# Patient Record
Sex: Female | Born: 1975
Health system: Southern US, Community
[De-identification: ages and names within clinical notes are randomized; demographics above are authoritative.]

## PROBLEM LIST (undated history)

## (undated) DIAGNOSIS — C50912 Malignant neoplasm of unspecified site of left female breast: Secondary | ICD-10-CM

## (undated) DIAGNOSIS — Z803 Family history of malignant neoplasm of breast: Secondary | ICD-10-CM

## (undated) DIAGNOSIS — M24119 Other articular cartilage disorders, unspecified shoulder: Secondary | ICD-10-CM

## (undated) DIAGNOSIS — M7541 Impingement syndrome of right shoulder: Secondary | ICD-10-CM

## (undated) DIAGNOSIS — E7800 Pure hypercholesterolemia, unspecified: Secondary | ICD-10-CM

## (undated) DIAGNOSIS — I82409 Acute embolism and thrombosis of unspecified deep veins of unspecified lower extremity: Secondary | ICD-10-CM

## (undated) DIAGNOSIS — M7521 Bicipital tendinitis, right shoulder: Secondary | ICD-10-CM

## (undated) DIAGNOSIS — Z8042 Family history of malignant neoplasm of prostate: Secondary | ICD-10-CM

## (undated) DIAGNOSIS — F988 Other specified behavioral and emotional disorders with onset usually occurring in childhood and adolescence: Secondary | ICD-10-CM

## (undated) DIAGNOSIS — Z9889 Other specified postprocedural states: Secondary | ICD-10-CM

## (undated) DIAGNOSIS — D649 Anemia, unspecified: Secondary | ICD-10-CM

## (undated) DIAGNOSIS — R112 Nausea with vomiting, unspecified: Secondary | ICD-10-CM

## (undated) DIAGNOSIS — R569 Unspecified convulsions: Secondary | ICD-10-CM

## (undated) DIAGNOSIS — G43909 Migraine, unspecified, not intractable, without status migrainosus: Secondary | ICD-10-CM

## (undated) DIAGNOSIS — F419 Anxiety disorder, unspecified: Secondary | ICD-10-CM

## (undated) HISTORY — PX: KNEE ARTHROSCOPY: SUR90

## (undated) HISTORY — PX: ANKLE ARTHROSCOPY: SUR85

## (undated) HISTORY — PX: KNEE ARTHROSCOPY W/ ACL RECONSTRUCTION: SHX1858

## (undated) HISTORY — DX: Family history of malignant neoplasm of prostate: Z80.42

## (undated) HISTORY — DX: Family history of malignant neoplasm of breast: Z80.3

## (undated) NOTE — *Deleted (*Deleted)
Has armband been applied?  {yes no:314532}  Does patient have an allergy to IV contrast dye?: no   Has patient ever received premedication for IV contrast dye?: n/a  Does patient take metformin?: No  If patient does take metformin when was the last dose: n/a  Date of lab work:  10/24/2019 BUN: 10 CR: 0.82 eGfr: >60  IV site: Chest port  Has IV site been added to flowsheet?  Yes

---
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
## 2010-10-19 ENCOUNTER — Emergency Department (HOSPITAL_COMMUNITY)
Admission: EM | Admit: 2010-10-19 | Discharge: 2010-10-19 | Disposition: A | Discharge status: Home / Self Care | Payer: BC Managed Care – PPO | Attending: Emergency Medicine | Admitting: Emergency Medicine

## 2010-10-19 ENCOUNTER — Emergency Department (HOSPITAL_COMMUNITY): Payer: BC Managed Care – PPO

## 2010-10-19 DIAGNOSIS — R5383 Other fatigue: Secondary | ICD-10-CM | POA: Insufficient documentation

## 2010-10-19 DIAGNOSIS — R55 Syncope and collapse: Secondary | ICD-10-CM | POA: Insufficient documentation

## 2010-10-19 DIAGNOSIS — R5381 Other malaise: Secondary | ICD-10-CM | POA: Insufficient documentation

## 2010-10-19 DIAGNOSIS — R51 Headache: Secondary | ICD-10-CM | POA: Insufficient documentation

## 2010-10-19 DIAGNOSIS — H539 Unspecified visual disturbance: Secondary | ICD-10-CM | POA: Insufficient documentation

## 2010-10-19 DIAGNOSIS — H538 Other visual disturbances: Secondary | ICD-10-CM | POA: Insufficient documentation

## 2010-10-19 LAB — POCT I-STAT, CHEM 8
BUN: 14 mg/dL (ref 6–23)
Calcium, Ion: 1.15 mmol/L (ref 1.12–1.32)
Chloride: 103 mEq/L (ref 96–112)
Creatinine, Ser: 0.9 mg/dL (ref 0.50–1.10)
Glucose, Bld: 95 mg/dL (ref 70–99)
HCT: 42 % (ref 36.0–46.0)
Hemoglobin: 14.3 g/dL (ref 12.0–15.0)
Potassium: 3.5 mEq/L (ref 3.5–5.1)
Sodium: 139 mEq/L (ref 135–145)
TCO2: 25 mmol/L (ref 0–100)

## 2010-10-26 ENCOUNTER — Inpatient Hospital Stay (HOSPITAL_COMMUNITY)
Admission: AD | Admit: 2010-10-26 | Discharge: 2010-10-30 | DRG: 053 | Disposition: A | Discharge status: Home / Self Care | Payer: BC Managed Care – PPO | Source: Ambulatory Visit | Attending: Otolaryngology | Admitting: Otolaryngology

## 2010-10-26 ENCOUNTER — Inpatient Hospital Stay (HOSPITAL_COMMUNITY): Payer: BC Managed Care – PPO

## 2010-10-26 DIAGNOSIS — J0130 Acute sphenoidal sinusitis, unspecified: Principal | ICD-10-CM | POA: Diagnosis present

## 2010-10-26 DIAGNOSIS — J323 Chronic sphenoidal sinusitis: Secondary | ICD-10-CM | POA: Diagnosis present

## 2010-10-26 DIAGNOSIS — J3489 Other specified disorders of nose and nasal sinuses: Secondary | ICD-10-CM | POA: Diagnosis present

## 2010-10-29 ENCOUNTER — Other Ambulatory Visit: Payer: Self-pay | Admitting: Otolaryngology

## 2010-10-29 HISTORY — PX: SEPTOPLASTY WITH ETHMOIDECTOMY, AND MAXILLARY ANTROSTOMY: SHX6090

## 2010-10-29 LAB — SURGICAL PCR SCREEN
MRSA, PCR: NEGATIVE
Staphylococcus aureus: NEGATIVE

## 2010-11-04 NOTE — Op Note (Signed)
Laura Anthony, LAVEY NO.:  [id number]  MEDICAL RECORD NO.:  [id number]  LOCATION:  5125                         FACILITY:  [facility]  PHYSICIAN:  Suzanna Obey, M.D.       DATE OF BIRTH:  [birth year]  DATE OF PROCEDURE:  10/29/2010 DATE OF DISCHARGE:                              OPERATIVE REPORT   PREOPERATIVE DIAGNOSIS:  Acute sinusitis and chronic sinusitis.  POSTOPERATIVE DIAGNOSIS:  Acute sinusitis and chronic sinusitis.  SURGICAL PROCEDURES:  Bilateral maxillary antrostomy with left maxillary stripping, left anterior ethmoidectomy, right total ethmoidectomy, and sphenoidotomy, and Stealth computer guidance.  SURGEON:  Suzanna Obey, MD  ANESTHESIA:  General.  ESTIMATED BLOOD LOSS:  Approximately 10 mL.  INDICATIONS:  This is a 35 year old who has had a severe headache and sinusitis for 2 weeks with a history of chronic sinusitis episodes.  She has had MRI and CT scan both of which showed a large cyst in the left maxillary and a significant right sphenoid sinusitis.  Initially, there was some question as to whether this was eroded on the MRI but a CT scan confirmed no erosion, but just large sphenoid sinus and sinusitis.  She has continued to do poorly regarding symptoms and headache in the hospital on intravenous antibiotics and now she is ready to proceed with surgery.  She was discussed of the risks, benefits, and options and all questions were answered and consent was obtained.  OPERATION IN DETAIL:  The patient was taken to the operating room and placed in supine position.  After general endotracheal tube anesthesia, she was prepped and draped in the usual sterile manner.  The Stealth computer system was positioned and calibrated with excellent accuracy. The oxymetazoline pledgets had been placed and the inferior turbinate and middle turbinate were injected with 1% lidocaine with 1:100,000 epinephrine.  The left side was begun opening up the uncinate  process after outfracturing the inferior turbinates.  This was removed with the microdebrider.  Under Stealth guidance, the large cyst once the antrostomy was opened widely could be seen.  The upbiting 40-degree microdebrider was used to strip the lining and cyst out from the inferior aspect of its origination.  The anterior ethmoid was opened, debrided of the small amount of bone and there was good hemostasis.  The operation was then directed to the right side where again the uncinate was removed.  There was a lot of thickened tissue in the uncinate area compared to the left.  This was opened up.  The maxillary antrostomy was opened widely.  The ethmoid was then dissected from anterior bringing it back to posterior to the face of the sphenoid sinus.  There was some thickened mucosa within the ethmoid and then right at the face of the sphenoid there was some polypoid material.  The sphenoid was opened using Stealth guidance and some mucus was suctioned out.  The sphenoid was faced and the antrostomy was opened widely.  There was no evidence of any mass or lesions within the large sphenoid that extended both lateral and medial beyond the midline.  There did not appear to be any fungal debris and there was good hemostasis.  Pledgets were then placed into  the ethmoid and then around the face of the sphenoid with oxymetazoline.  The nasopharynx was suctioned out of all blood and debris.  The patient was then awakened, brought to recovery in stable condition.  Counts were correct.          ______________________________ Suzanna Obey, M.D.     JB/MEDQ  D:  10/29/2010  T:  10/29/2010  Job:  [id number]  cc:   Tammy R. Collins [place], M.D.  Electronically Signed by Suzanna Obey M.D. on 11/04/2010 10:26:50 AM

## 2010-12-02 NOTE — Discharge Summary (Signed)
  Laura Anthony, Laura Anthony NO.:  [id number]  MEDICAL RECORD NO.:  [id number]  LOCATION:  5125                         FACILITY:  [facility]  PHYSICIAN:  Suzanna Obey, M.D.       DATE OF BIRTH:  02-25-1975  DATE OF ADMISSION:  10/26/2010 DATE OF DISCHARGE:  10/30/2010                              DISCHARGE SUMMARY   ADMISSION DIAGNOSES:  Sphenoid sinusitis and headache.  POSTOPERATIVE DIAGNOSES:  Sphenoid sinusitis and headache.  DISCHARGE DIAGNOSES:  Sphenoid sinusitis and headache.  SURGICAL PROCEDURE:  Endoscopic sinus surgery.  HOSPITAL COURSE:  This is a 35 year old who presented with a severe headache that was incapacitating and ongoing which was refractory to medical therapy.  She was admitted for intravenous antibiotics that were delivered but the patient was not improving and wanted to proceed with surgery after contemplating home intravenous antibiotics.  Initially, she got a little better with the headache but then it immediately recurred, so surgery was contemplated, discussed, and she underwent surgery on October 29, 2010.  She did well and the sinuses were opened up.  On October 30, 2010, she felt excellent, headache was substantially better, and was discharged to home with saline irrigation and continue her home antibiotics and to follow up in 1 week.          ______________________________ Suzanna Obey, M.D.     JB/MEDQ  D:  11/25/2010  T:  11/25/2010  Job:  [id number]  Electronically Signed by Suzanna Obey M.D. on 12/02/2010 11:05:34 AM

## 2012-01-12 ENCOUNTER — Other Ambulatory Visit: Payer: Self-pay | Admitting: Orthopedic Surgery

## 2012-01-16 ENCOUNTER — Encounter (HOSPITAL_BASED_OUTPATIENT_CLINIC_OR_DEPARTMENT_OTHER): Payer: Self-pay | Admitting: *Deleted

## 2012-01-17 ENCOUNTER — Encounter (HOSPITAL_BASED_OUTPATIENT_CLINIC_OR_DEPARTMENT_OTHER): Payer: Self-pay | Admitting: *Deleted

## 2012-01-17 ENCOUNTER — Ambulatory Visit (HOSPITAL_BASED_OUTPATIENT_CLINIC_OR_DEPARTMENT_OTHER): Payer: BC Managed Care – PPO | Admitting: Certified Registered Nurse Anesthetist

## 2012-01-17 ENCOUNTER — Encounter (HOSPITAL_BASED_OUTPATIENT_CLINIC_OR_DEPARTMENT_OTHER): Payer: Self-pay | Admitting: Certified Registered Nurse Anesthetist

## 2012-01-17 ENCOUNTER — Encounter (HOSPITAL_BASED_OUTPATIENT_CLINIC_OR_DEPARTMENT_OTHER): Payer: Self-pay | Admitting: Orthopedic Surgery

## 2012-01-17 ENCOUNTER — Ambulatory Visit (HOSPITAL_BASED_OUTPATIENT_CLINIC_OR_DEPARTMENT_OTHER)
Admission: RE | Admit: 2012-01-17 | Discharge: 2012-01-17 | Disposition: A | Discharge status: Home / Self Care | Payer: BC Managed Care – PPO | Source: Ambulatory Visit | Attending: Orthopedic Surgery | Admitting: Orthopedic Surgery

## 2012-01-17 ENCOUNTER — Encounter (HOSPITAL_BASED_OUTPATIENT_CLINIC_OR_DEPARTMENT_OTHER)
Admission: RE | Disposition: A | Discharge status: Home / Self Care | Payer: Self-pay | Source: Ambulatory Visit | Attending: Orthopedic Surgery

## 2012-01-17 DIAGNOSIS — F329 Major depressive disorder, single episode, unspecified: Secondary | ICD-10-CM | POA: Insufficient documentation

## 2012-01-17 DIAGNOSIS — M24139 Other articular cartilage disorders, unspecified wrist: Secondary | ICD-10-CM | POA: Insufficient documentation

## 2012-01-17 DIAGNOSIS — F3289 Other specified depressive episodes: Secondary | ICD-10-CM | POA: Insufficient documentation

## 2012-01-17 DIAGNOSIS — F411 Generalized anxiety disorder: Secondary | ICD-10-CM | POA: Insufficient documentation

## 2012-01-17 DIAGNOSIS — Z885 Allergy status to narcotic agent status: Secondary | ICD-10-CM | POA: Insufficient documentation

## 2012-01-17 HISTORY — DX: Unspecified convulsions: R56.9

## 2012-01-17 HISTORY — PX: WRIST ARTHROSCOPY: SHX838

## 2012-01-17 HISTORY — DX: Anxiety disorder, unspecified: F41.9

## 2012-01-17 HISTORY — DX: Other specified postprocedural states: Z98.890

## 2012-01-17 HISTORY — DX: Other specified postprocedural states: R11.2

## 2012-01-17 SURGERY — ARTHROSCOPY, WRIST
Anesthesia: General | Site: Wrist | Laterality: Right | Wound class: Clean

## 2012-01-17 MED ORDER — ONDANSETRON HCL 4 MG/2ML IJ SOLN
INTRAMUSCULAR | Status: DC | PRN
  Administered 2012-01-17: 4 mg via INTRAVENOUS

## 2012-01-17 MED ORDER — SODIUM CHLORIDE 0.9 % IR SOLN
Status: DC | PRN
  Administered 2012-01-17: 3000 mL

## 2012-01-17 MED ORDER — FENTANYL CITRATE 0.05 MG/ML IJ SOLN
INTRAMUSCULAR | Status: DC | PRN
  Administered 2012-01-17: 50 ug via INTRAVENOUS

## 2012-01-17 MED ORDER — LACTATED RINGERS IV SOLN
INTRAVENOUS | Status: DC
  Administered 2012-01-17 (×2): via INTRAVENOUS

## 2012-01-17 MED ORDER — CEFAZOLIN SODIUM-DEXTROSE 2-3 GM-% IV SOLR
2.0000 g | Freq: Once | INTRAVENOUS | Status: AC
  Administered 2012-01-17: 2 g via INTRAVENOUS

## 2012-01-17 MED ORDER — CHLORHEXIDINE GLUCONATE 4 % EX LIQD: 60.0000 mL | Freq: Once | CUTANEOUS | Status: DC

## 2012-01-17 MED ORDER — BUPIVACAINE-EPINEPHRINE PF 0.5-1:200000 % IJ SOLN
INTRAMUSCULAR | Status: DC | PRN
  Administered 2012-01-17: 25 mL

## 2012-01-17 MED ORDER — DEXAMETHASONE SODIUM PHOSPHATE 10 MG/ML IJ SOLN
INTRAMUSCULAR | Status: DC | PRN
  Administered 2012-01-17: 10 mg via INTRAVENOUS
  Administered 2012-01-17: 5 mg

## 2012-01-17 MED ORDER — MIDAZOLAM HCL 5 MG/5ML IJ SOLN
INTRAMUSCULAR | Status: DC | PRN
  Administered 2012-01-17: 1 mg via INTRAVENOUS

## 2012-01-17 MED ORDER — MIDAZOLAM HCL 2 MG/2ML IJ SOLN
1.0000 mg | INTRAMUSCULAR | Status: DC | PRN
  Administered 2012-01-17: 2 mg via INTRAVENOUS

## 2012-01-17 MED ORDER — HYDROCODONE-ACETAMINOPHEN 5-325 MG PO TABS: ORAL_TABLET | ORAL | Status: DC

## 2012-01-17 MED ORDER — SCOPOLAMINE 1 MG/3DAYS TD PT72
MEDICATED_PATCH | TRANSDERMAL | Status: DC | PRN
  Administered 2012-01-17: 1.5 mg via TRANSDERMAL

## 2012-01-17 MED ORDER — LIDOCAINE HCL (CARDIAC) 20 MG/ML IV SOLN
INTRAVENOUS | Status: DC | PRN
  Administered 2012-01-17: 50 mg via INTRAVENOUS

## 2012-01-17 MED ORDER — PROPOFOL 10 MG/ML IV BOLUS
INTRAVENOUS | Status: DC | PRN
  Administered 2012-01-17: 150 mg via INTRAVENOUS
  Administered 2012-01-17: 50 mg via INTRAVENOUS

## 2012-01-17 MED ORDER — FENTANYL CITRATE 0.05 MG/ML IJ SOLN
50.0000 ug | INTRAMUSCULAR | Status: DC | PRN
  Administered 2012-01-17: 100 ug via INTRAVENOUS

## 2012-01-17 SURGICAL SUPPLY — 92 items
BANDAGE ELASTIC 3 VELCRO ST LF (GAUZE/BANDAGES/DRESSINGS) ×2 IMPLANT
BANDAGE ELASTIC 4 VELCRO ST LF (GAUZE/BANDAGES/DRESSINGS) ×2 IMPLANT
BANDAGE GAUZE ELAST BULKY 4 IN (GAUZE/BANDAGES/DRESSINGS) ×2 IMPLANT
BLADE CUDA 2.0 (BLADE) IMPLANT
BLADE EAR TYMPAN 2.5 60D BEAV (BLADE) IMPLANT
BLADE MINI RND TIP GREEN BEAV (BLADE) IMPLANT
BLADE SURG 15 STRL LF DISP TIS (BLADE) ×3 IMPLANT
BLADE SURG 15 STRL SS (BLADE) ×3
BNDG ELASTIC 2 VLCR STRL LF (GAUZE/BANDAGES/DRESSINGS) IMPLANT
BNDG ESMARK 4X9 LF (GAUZE/BANDAGES/DRESSINGS) IMPLANT
BUR CUDA 2.9 (BURR) IMPLANT
BUR FULL RADIUS 2.0 (BURR) IMPLANT
BUR FULL RADIUS 2.9 (BURR) ×2 IMPLANT
BUR GATOR 2.9 (BURR) IMPLANT
BUR SPHERICAL 2.9 (BURR) IMPLANT
CANISTER OMNI JUG 16 LITER (MISCELLANEOUS) IMPLANT
CANISTER SUCTION 1200CC (MISCELLANEOUS) IMPLANT
CANISTER SUCTION 2500CC (MISCELLANEOUS) ×2 IMPLANT
CHLORAPREP W/TINT 26ML (MISCELLANEOUS) ×2 IMPLANT
CLOTH BEACON ORANGE TIMEOUT ST (SAFETY) ×2 IMPLANT
CORDS BIPOLAR (ELECTRODE) IMPLANT
COVER MAYO STAND STRL (DRAPES) ×4 IMPLANT
COVER TABLE BACK 60X90 (DRAPES) ×2 IMPLANT
CUFF TOURNIQUET SINGLE 18IN (TOURNIQUET CUFF) ×2 IMPLANT
DRAPE EXTREMITY T 121X128X90 (DRAPE) ×2 IMPLANT
DRAPE OEC MINIVIEW 54X84 (DRAPES) IMPLANT
DRAPE SURG 17X23 STRL (DRAPES) ×2 IMPLANT
DRSG KUZMA FLUFF (GAUZE/BANDAGES/DRESSINGS) IMPLANT
ELECT SMALL JOINT 90D BASC (ELECTRODE) IMPLANT
GAUZE XEROFORM 1X8 LF (GAUZE/BANDAGES/DRESSINGS) ×2 IMPLANT
GLOVE BIO SURGEON STRL SZ 6.5 (GLOVE) ×2 IMPLANT
GLOVE BIO SURGEON STRL SZ7.5 (GLOVE) ×2 IMPLANT
GLOVE BIOGEL PI IND STRL 7.0 (GLOVE) ×1 IMPLANT
GLOVE BIOGEL PI IND STRL 8 (GLOVE) ×1 IMPLANT
GLOVE BIOGEL PI IND STRL 8.5 (GLOVE) ×1 IMPLANT
GLOVE BIOGEL PI INDICATOR 7.0 (GLOVE) ×1
GLOVE BIOGEL PI INDICATOR 8 (GLOVE) ×1
GLOVE BIOGEL PI INDICATOR 8.5 (GLOVE) ×1
GLOVE EXAM NITRILE EXT CUFF MD (GLOVE) ×2 IMPLANT
GLOVE SURG ORTHO 8.0 STRL STRW (GLOVE) ×2 IMPLANT
GOWN PREVENTION PLUS XLARGE (GOWN DISPOSABLE) ×2 IMPLANT
GOWN STRL REIN XL XLG (GOWN DISPOSABLE) ×4 IMPLANT
IV SET EXTENSION GRAVITY 40 LF (IV SETS) ×2 IMPLANT
NDL SAFETY ECLIPSE 18X1.5 (NEEDLE) ×3 IMPLANT
NEEDLE FILTER BLUNT 18X 1/2SAF (NEEDLE)
NEEDLE FILTER BLUNT 18X1 1/2 (NEEDLE) IMPLANT
NEEDLE HYPO 18GX1.5 SHARP (NEEDLE) ×3
NEEDLE HYPO 22GX1.5 SAFETY (NEEDLE) ×2 IMPLANT
NEEDLE SPNL 18GX3.5 QUINCKE PK (NEEDLE) IMPLANT
NEEDLE TUOHY 20GX3.5 (NEEDLE) IMPLANT
NS IRRIG 1000ML POUR BTL (IV SOLUTION) IMPLANT
PACK BASIN DAY SURGERY FS (CUSTOM PROCEDURE TRAY) ×2 IMPLANT
PAD CAST 3X4 CTTN HI CHSV (CAST SUPPLIES) ×1 IMPLANT
PAD CAST 4YDX4 CTTN HI CHSV (CAST SUPPLIES) ×1 IMPLANT
PADDING CAST ABS 3INX4YD NS (CAST SUPPLIES) ×1
PADDING CAST ABS 4INX4YD NS (CAST SUPPLIES)
PADDING CAST ABS COTTON 3X4 (CAST SUPPLIES) ×1 IMPLANT
PADDING CAST ABS COTTON 4X4 ST (CAST SUPPLIES) IMPLANT
PADDING CAST COTTON 3X4 STRL (CAST SUPPLIES) ×1
PADDING CAST COTTON 4X4 STRL (CAST SUPPLIES) ×1
ROUTER HOODED VORTEX 2.9MM (BLADE) IMPLANT
SET ARTHROSCOPY TUBING (MISCELLANEOUS) ×1
SET ARTHROSCOPY TUBING LN (MISCELLANEOUS) ×1 IMPLANT
SET SM JOINT TUBING/CANN (CANNULA) IMPLANT
SLEEVE SCD COMPRESS KNEE MED (MISCELLANEOUS) ×2 IMPLANT
SLING ARM FOAM STRAP MED (SOFTGOODS) ×2 IMPLANT
SPLINT PLASTER CAST XFAST 3X15 (CAST SUPPLIES) ×30 IMPLANT
SPLINT PLASTER CAST XFAST 4X15 (CAST SUPPLIES) IMPLANT
SPLINT PLASTER XTRA FAST SET 4 (CAST SUPPLIES)
SPLINT PLASTER XTRA FASTSET 3X (CAST SUPPLIES) ×30
SPONGE GAUZE 4X4 12PLY (GAUZE/BANDAGES/DRESSINGS) ×2 IMPLANT
STOCKINETTE 4X48 STRL (DRAPES) ×2 IMPLANT
STRIP CLOSURE SKIN 1/2X4 (GAUZE/BANDAGES/DRESSINGS) IMPLANT
STRIP CLOSURE SKIN 1/4X4 (GAUZE/BANDAGES/DRESSINGS) IMPLANT
SUCTION FRAZIER TIP 10 FR DISP (SUCTIONS) IMPLANT
SUT ETHILON 4 0 PS 2 18 (SUTURE) ×2 IMPLANT
SUT ETHILON 5 0 P 3 18 (SUTURE)
SUT MERSILENE 4 0 P 3 (SUTURE) IMPLANT
SUT NYLON ETHILON 5-0 P-3 1X18 (SUTURE) IMPLANT
SUT PDS AB 2-0 CT2 27 (SUTURE) IMPLANT
SUT STEEL 3 0 (SUTURE) ×2 IMPLANT
SUT STEEL 4 0 (SUTURE) IMPLANT
SUT VIC AB 2-0 PS2 27 (SUTURE) ×2 IMPLANT
SUT VICRYL 4-0 PS2 18IN ABS (SUTURE) ×2 IMPLANT
SUT VICRYL RAPID 5 0 P 3 (SUTURE) IMPLANT
SUT VICRYL RAPIDE 4/0 PS 2 (SUTURE) IMPLANT
SYR BULB 3OZ (MISCELLANEOUS) IMPLANT
SYR CONTROL 10ML LL (SYRINGE) ×2 IMPLANT
TUBE CONNECTING 20X1/4 (TUBING) IMPLANT
UNDERPAD 30X30 INCONTINENT (UNDERPADS AND DIAPERS) ×2 IMPLANT
WAND 1.5 MICROBLATOR (SURGICAL WAND) ×2 IMPLANT
WATER STERILE IRR 1000ML POUR (IV SOLUTION) ×2 IMPLANT

## 2012-01-17 NOTE — Op Note (Signed)
Dictation [phone]

## 2012-01-17 NOTE — Anesthesia Procedure Notes (Addendum)
Anesthesia Regional Block:  Supraclavicular block  Pre-Anesthetic Checklist: ,, timeout performed, Correct Patient, Correct Site, Correct Laterality, Correct Procedure, Correct Position, site marked, Risks and benefits discussed,  Surgical consent,  Pre-op evaluation,  At surgeon's request and post-op pain management  Laterality: Left and Upper  Prep: chloraprep       Needles:  Injection technique: Single-shot  Needle Type: Echogenic Needle     Needle Length: 5cm 5 cm Needle Gauge: 21    Additional Needles:  Procedures: ultrasound guided (picture in chart) Supraclavicular block Narrative:  Start time: 01/17/2012 11:35 AM End time: 01/17/2012 11:42 AM Injection made incrementally with aspirations every 5 mL.  Performed by: Personally  Anesthesiologist: [name] Silvan  Supraclavicular block Procedure Name: LMA Insertion Date/Time: 01/17/2012 12:50 PM Performed by: Verlan Friends Pre-anesthesia Checklist: Patient identified, Emergency Drugs available, Suction available, Patient being monitored and Timeout performed Patient Re-evaluated:Patient Re-evaluated prior to inductionOxygen Delivery Method: Circle System Utilized Preoxygenation: Pre-oxygenation with 100% oxygen Intubation Type: IV induction Ventilation: Mask ventilation without difficulty LMA: LMA inserted LMA Size: 4.0 Number of attempts: 1 Airway Equipment and Method: bite block Placement Confirmation: positive ETCO2 Tube secured with: Tape Dental Injury: Teeth and Oropharynx as per pre-operative assessment

## 2012-01-17 NOTE — Transfer of Care (Signed)
Immediate Anesthesia Transfer of Care Note  Patient: Laura Anthony  Procedure(s) Performed: Procedure(s) (LRB) with comments: ARTHROSCOPY WRIST (Right) - RIGHT WRIST ARTHROSCOPY WITH TRIANGULAR FIBROCARTILAGE COMPLEX REPAIR AND DEBRIDEMENT   Patient Location: PACU  Anesthesia Type:GA combined with regional for post-op pain  Level of Consciousness: awake, alert  and oriented  Airway & Oxygen Therapy: Patient Spontanous Breathing and Patient connected to face mask oxygen  Post-op Assessment: Report given to PACU RN, Post -op Vital signs reviewed and stable and Patient moving all extremities  Post vital signs: Reviewed and stable  Complications: No apparent anesthesia complications

## 2012-01-17 NOTE — Anesthesia Preprocedure Evaluation (Addendum)
Anesthesia Evaluation Anesthesia Physical Anesthesia Plan  ASA: II  Anesthesia Plan:    Post-op Pain Management:    Induction:   Airway Management Planned:   Additional Equipment:   Intra-op Plan:   Post-operative Plan:   Informed Consent:   Plan Discussed with:   Anesthesia Plan Comments:         Anesthesia Quick Evaluation  

## 2012-01-17 NOTE — Progress Notes (Signed)
Assisted Dr. Crews with right, ultrasound guided, interscalene  block. Side rails up, monitors on throughout procedure. See vital signs in flow sheet. Tolerated Procedure well. 

## 2012-01-17 NOTE — H&P (Signed)
  Laura Anthony is an 36 y.o. female.   Chief Complaint: right wrist tfcc injury HPI: 36 yo rhd female with pain in right wrist x 6 months.  No specific injury.  Pain on ulnar side of wrist.  Injection of wrist with no relief.  Therapy without relief.  Past Medical History  Diagnosis Date  . PONV (postoperative nausea and vomiting)   . Seizures     x 1 as a child - was never on medication to prevent seizures  . Anxiety   . Depression   . 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 Knee arthroscopy w/ acl reconstruction     left  . Adenoidectomy   . Knee arthroscopy     right  . Ankle arthroscopy     right  . Septoplasty with ethmoidectomy, and maxillary antrostomy 10/29/2010    bilat. max. antrostomy with left max. stripping; left ant. ethmoidectomy; right total ethmoidectomy; sphenoidotomy    History reviewed. No pertinent family history. Social History:  reports that she has never smoked. She has never used smokeless tobacco. She reports that she drinks alcohol. She reports that she does not use illicit drugs.  Allergies:  Allergies  Allergen Reactions  . Morphine And Related Shortness Of Breath and Itching    No prescriptions prior to admission    No results found for this or any previous visit (from the past 48 hour(s)).  No results found.   A comprehensive review of systems was negative.  Height 5\' 7"  (1.702 m), weight 68.04 kg (150 lb).  General appearance: alert, cooperative and appears stated age Head: Normocephalic, without obvious abnormality, atraumatic Neck: supple, symmetrical, trachea midline Resp: clear to auscultation bilaterally Cardio: regular rate and rhythm GI: non tender Extremities: intact sensation and capillary refill all digits.  shuck testing of drum without instability.  crepitance in wrist with rotational motion. Pulses: 2+ and symmetric Skin: Skin color, texture,  turgor normal. No rashes or lesions Neurologic: Grossly normal Incision/Wound: na  Assessment/Plan Right wrist tfcc injury.  MRI with possible tfcc injury to superficial and possibly deep fibers.  Non operative and operative treatment options were discussed with the patient and patient wishes to proceed with operative treatment. Risks, benefits, and alternatives of surgery were discussed and the patient agrees with the plan of care.   Bee Hammerschmidt R 01/17/2012, 9:28 AM

## 2012-01-17 NOTE — Anesthesia Postprocedure Evaluation (Signed)
  Anesthesia Post-op Note  Patient: Laura Anthony  Procedure(s) Performed: Procedure(s) (LRB) with comments: ARTHROSCOPY WRIST (Right) - RIGHT WRIST ARTHROSCOPY WITH TRIANGULAR FIBROCARTILAGE COMPLEX REPAIR AND DEBRIDEMENT   Patient Location: PACU  Anesthesia Type:GA combined with regional for post-op pain  Level of Consciousness: awake, alert  and oriented  Airway and Oxygen Therapy: Patient Spontanous Breathing  Post-op Pain: none  Post-op Assessment: Post-op Vital signs reviewed  Post-op Vital Signs: Reviewed  Complications: No apparent anesthesia complications

## 2012-01-18 ENCOUNTER — Encounter (HOSPITAL_BASED_OUTPATIENT_CLINIC_OR_DEPARTMENT_OTHER): Payer: Self-pay | Admitting: Orthopedic Surgery

## 2012-01-18 NOTE — Op Note (Signed)
Laura Anthony, Laura Anthony             ACCOUNT NO.:  [id number]  MEDICAL RECORD NO.:  [id number]  LOCATION:                                 FACILITY:  PHYSICIAN:  Betha Loa, MD             DATE OF BIRTH:  DATE OF PROCEDURE:  01/17/2012 DATE OF DISCHARGE:                              OPERATIVE REPORT   PREOPERATIVE DIAGNOSIS:  Right triangular fibrocartilage complex peripheral tear.  POSTOPERATIVE DIAGNOSIS:  Right triangular fibrocartilage complex peripheral tear.  PROCEDURE:  Right wrist arthroscopy with repair of superficial fibers, triangular fibrocartilage complex.  SURGEON:  Betha Loa, MD  ASSISTANT:  Cindee Salt, MD  ANESTHESIA:  General with regional.  IV FLUIDS:  Per anesthesia flow sheet.  ESTIMATED BLOOD LOSS:  Minimal.  COMPLICATIONS:  None.  SPECIMENS:  None.  TOURNIQUET TIME:  None.  DISPOSITION:  Stable to PACU.  INDICATIONS:  Ms. Milroy is a 36 year old female who has had 6 months of ulnar-sided right wrist pain.  She does not remember specific injuries.  She has tried nonoperative treatments.  She had an MRI done, which showed tear of the superficial fibers of the TFCC.  I discussed with Ms. Runkel the nature of the injury.  We discussed nonoperative and operative treatment options.  She wished to proceed with operative treatment with arthroscopy and potential repair.  Risks, benefits, and alternatives of the surgery were discussed including the risk of blood loss; infection; damage to nerves, vessels, tendons, ligaments, bone; failure of surgery; need for additional surgery; complications with wound healing; continued pain.  She voiced understanding of these risks and elected to proceed.  OPERATIVE COURSE:  After being identified preoperatively by myself, the patient and I agreed upon the procedure and site of procedure.  Surgical site was marked.  Risks, benefits, and alternatives of the surgery were reviewed and she wished to proceed.   Surgical consent had been signed. She was given 2 g of IV Ancef as preoperative antibiotic prophylaxis. She was transferred to the operating room and placed on the operating room table in supine position with the right upper extremity on an armboard.  General anesthesia was induced by the anesthesiologist.  The regional block had been performed by anesthesia in preoperative holding. Right upper extremity was prepped and draped in normal sterile orthopedic fashion.  A surgical pause was performed between the surgeons, anesthesia, and operating room staff, and all were in agreement as to patient, procedure, and site of procedure.  The hand had been secured in the arthroscopy tower with the finger traps on the index and long finger, which were removed at the end of the case.  The 3-4 portal was entered using a needle.  The joint was insufflated.  An incision was made through the skin only and spreading technique was used in the deeper tissues.  The joint was entered with the trocar.  The camera Was introduced and the joint was inspected.  The radioscapholunate and long radiolunate ligaments were identified and were intact.  The radioscaphocapitate and ulnocarpal ligaments were identified and appeared intact.  The scapholunate and lunotriquetral ligaments were identified and did not have any evidence of tear.  The TFCC was examined.  The prestyloid recess was identified.  There appeared to be some tearing of the superficial fibers of the TFCC at the ulnar attachment.  The midcarpal joint was entered.  The scapholunate and lunotriquetral intervals were visualized and appeared normal.  The capitohamate joint was visualized and appeared intact.  The shaver was introduced through the 4-5 portal in the radiocarpal joint.  This was used to debride the area around the TFCC where there was some synovitis. The probe was inserted and the peripheral tear identified.  The electrocautery wand was used to  obtain hemostasis.  Repair of the peripheral tear was performed using a technique with Vicryl suture. This was passed in a vertical mattress fashion.  Tension on the sutures reapposed the torn edge of the TFCC to the capsule well.  An incision was made on the dorsum of the wrist.  The sixth dorsal compartment was entered.  One of the suture arms had gone through the ECU tendon and this was freed up.  The remaining suture arm was identified and ensured that there was no neurovascular structure underneath the sutures.  The suture was then tied over the capsule. This apposed to be torn TFCC edge to the edge of the capsule well.  This was performed under direct visualization through the arthroscope.  The arthroscopic equipment was then removed.  A 4-0 Vicryl suture was used to repair the retinacular rent in the dorsum of the wrist with a single figure-of-eight suture.  The skin was repaired with 4-0 nylon in a horizontal mattress fashion.  The wounds were then dressed with sterile Xeroform and 4x4s, and wrapped with a Kerlix bandage.  A splint was placed with the elbow at 90 degrees and the forearm in mid-supination. This was wrapped with Kerlix and Ace bandage.  Tourniquet was never inflated.  The fingertips were all pink with brisk capillary refill after the procedure.  The operative drapes were broken down.  The patient was awakened from anesthesia safely.  She was transferred back to the stretcher and taken to PACU in stable condition.  I will see her back in the office in 1 week for postoperative followup.  I will give her Norco 5/325 1-2 p.o. q.6 hours p.r.n. pain, dispensed #40.  If she is not able to tolerate this, we will use Ultram.     Betha Loa, MD     KK/MEDQ  D:  01/17/2012  T:  01/18/2012  Job:  [id number]

## 2012-12-13 DIAGNOSIS — G4700 Insomnia, unspecified: Secondary | ICD-10-CM | POA: Insufficient documentation

## 2012-12-13 DIAGNOSIS — F419 Anxiety disorder, unspecified: Secondary | ICD-10-CM | POA: Insufficient documentation

## 2013-01-09 DIAGNOSIS — F988 Other specified behavioral and emotional disorders with onset usually occurring in childhood and adolescence: Secondary | ICD-10-CM | POA: Insufficient documentation

## 2013-03-22 ENCOUNTER — Other Ambulatory Visit: Payer: Self-pay | Admitting: Obstetrics and Gynecology

## 2013-03-22 DIAGNOSIS — Z803 Family history of malignant neoplasm of breast: Secondary | ICD-10-CM

## 2013-03-26 ENCOUNTER — Ambulatory Visit
Admission: RE | Admit: 2013-03-26 | Discharge: 2013-03-26 | Disposition: A | Discharge status: Home / Self Care | Payer: BC Managed Care – PPO | Source: Ambulatory Visit | Attending: Obstetrics and Gynecology | Admitting: Obstetrics and Gynecology

## 2013-03-26 DIAGNOSIS — Z803 Family history of malignant neoplasm of breast: Secondary | ICD-10-CM

## 2013-03-26 MED ORDER — GADOBENATE DIMEGLUMINE 529 MG/ML IV SOLN
13.0000 mL | Freq: Once | INTRAVENOUS | Status: AC | PRN
  Administered 2013-03-26: 13 mL via INTRAVENOUS

## 2013-03-28 ENCOUNTER — Other Ambulatory Visit: Payer: BC Managed Care – PPO

## 2013-07-01 ENCOUNTER — Other Ambulatory Visit: Payer: Self-pay | Admitting: Orthopedic Surgery

## 2013-07-01 DIAGNOSIS — M7541 Impingement syndrome of right shoulder: Secondary | ICD-10-CM

## 2013-07-01 DIAGNOSIS — M7521 Bicipital tendinitis, right shoulder: Secondary | ICD-10-CM

## 2013-07-01 HISTORY — DX: Impingement syndrome of right shoulder: M75.41

## 2013-07-01 HISTORY — DX: Bicipital tendinitis, right shoulder: M75.21

## 2013-07-05 ENCOUNTER — Encounter (HOSPITAL_BASED_OUTPATIENT_CLINIC_OR_DEPARTMENT_OTHER): Payer: Self-pay | Admitting: *Deleted

## 2013-07-12 ENCOUNTER — Encounter (HOSPITAL_BASED_OUTPATIENT_CLINIC_OR_DEPARTMENT_OTHER): Payer: BC Managed Care – PPO | Admitting: Anesthesiology

## 2013-07-12 ENCOUNTER — Ambulatory Visit (HOSPITAL_BASED_OUTPATIENT_CLINIC_OR_DEPARTMENT_OTHER)
Admission: RE | Admit: 2013-07-12 | Discharge: 2013-07-12 | Disposition: A | Discharge status: Home / Self Care | Payer: BC Managed Care – PPO | Source: Ambulatory Visit | Attending: Orthopedic Surgery | Admitting: Orthopedic Surgery

## 2013-07-12 ENCOUNTER — Encounter (HOSPITAL_BASED_OUTPATIENT_CLINIC_OR_DEPARTMENT_OTHER)
Admission: RE | Disposition: A | Discharge status: Home / Self Care | Payer: Self-pay | Source: Ambulatory Visit | Attending: Orthopedic Surgery

## 2013-07-12 ENCOUNTER — Encounter (HOSPITAL_BASED_OUTPATIENT_CLINIC_OR_DEPARTMENT_OTHER): Payer: Self-pay

## 2013-07-12 ENCOUNTER — Ambulatory Visit (HOSPITAL_BASED_OUTPATIENT_CLINIC_OR_DEPARTMENT_OTHER): Payer: BC Managed Care – PPO | Admitting: Anesthesiology

## 2013-07-12 DIAGNOSIS — Z79899 Other long term (current) drug therapy: Secondary | ICD-10-CM | POA: Insufficient documentation

## 2013-07-12 DIAGNOSIS — M25819 Other specified joint disorders, unspecified shoulder: Secondary | ICD-10-CM | POA: Insufficient documentation

## 2013-07-12 DIAGNOSIS — M7542 Impingement syndrome of left shoulder: Secondary | ICD-10-CM

## 2013-07-12 DIAGNOSIS — M25519 Pain in unspecified shoulder: Secondary | ICD-10-CM | POA: Insufficient documentation

## 2013-07-12 DIAGNOSIS — M7541 Impingement syndrome of right shoulder: Secondary | ICD-10-CM

## 2013-07-12 DIAGNOSIS — M7580 Other shoulder lesions, unspecified shoulder: Principal | ICD-10-CM

## 2013-07-12 DIAGNOSIS — F988 Other specified behavioral and emotional disorders with onset usually occurring in childhood and adolescence: Secondary | ICD-10-CM | POA: Insufficient documentation

## 2013-07-12 HISTORY — DX: Bicipital tendinitis, right shoulder: M75.21

## 2013-07-12 HISTORY — DX: Other specified behavioral and emotional disorders with onset usually occurring in childhood and adolescence: F98.8

## 2013-07-12 HISTORY — DX: Impingement syndrome of right shoulder: M75.41

## 2013-07-12 HISTORY — DX: Other articular cartilage disorders, unspecified shoulder: M24.119

## 2013-07-12 HISTORY — PX: SHOULDER ARTHROSCOPY WITH SUBACROMIAL DECOMPRESSION AND BICEP TENDON REPAIR: SHX5689

## 2013-07-12 SURGERY — SHOULDER ARTHROSCOPY WITH SUBACROMIAL DECOMPRESSION AND BICEP TENDON REPAIR
Anesthesia: General | Site: Shoulder | Laterality: Right

## 2013-07-12 MED ORDER — PROPOFOL 10 MG/ML IV BOLUS
INTRAVENOUS | Status: DC | PRN
  Administered 2013-07-12: 160 mg via INTRAVENOUS

## 2013-07-12 MED ORDER — OXYCODONE HCL 5 MG PO TABS: 5.0000 mg | ORAL_TABLET | Freq: Once | ORAL | Status: DC | PRN

## 2013-07-12 MED ORDER — HYDROMORPHONE HCL PF 1 MG/ML IJ SOLN
0.2500 mg | INTRAMUSCULAR | Status: DC | PRN
  Administered 2013-07-12: 0.5 mg via INTRAVENOUS

## 2013-07-12 MED ORDER — MIDAZOLAM HCL 2 MG/2ML IJ SOLN
1.0000 mg | INTRAMUSCULAR | Status: DC | PRN
  Administered 2013-07-12: 2 mg via INTRAVENOUS

## 2013-07-12 MED ORDER — SODIUM CHLORIDE 0.9 % IR SOLN
Status: DC | PRN
  Administered 2013-07-12: 6000 mL

## 2013-07-12 MED ORDER — FENTANYL CITRATE 0.05 MG/ML IJ SOLN
50.0000 ug | INTRAMUSCULAR | Status: DC | PRN
  Administered 2013-07-12: 100 ug via INTRAVENOUS

## 2013-07-12 MED ORDER — FENTANYL CITRATE 0.05 MG/ML IJ SOLN
INTRAMUSCULAR | Status: AC
  Filled 2013-07-12: qty 2

## 2013-07-12 MED ORDER — DEXAMETHASONE SODIUM PHOSPHATE 4 MG/ML IJ SOLN
INTRAMUSCULAR | Status: DC | PRN
  Administered 2013-07-12: 10 mg via INTRAVENOUS

## 2013-07-12 MED ORDER — METHOCARBAMOL 500 MG PO TABS: 500.0000 mg | ORAL_TABLET | Freq: Four times a day (QID) | ORAL | Status: DC

## 2013-07-12 MED ORDER — MIDAZOLAM HCL 2 MG/ML PO SYRP: 12.0000 mg | ORAL_SOLUTION | Freq: Once | ORAL | Status: DC | PRN

## 2013-07-12 MED ORDER — SENNA-DOCUSATE SODIUM 8.6-50 MG PO TABS: 2.0000 | ORAL_TABLET | Freq: Every day | ORAL | Status: DC

## 2013-07-12 MED ORDER — PROMETHAZINE HCL 25 MG/ML IJ SOLN: 6.2500 mg | INTRAMUSCULAR | Status: DC | PRN

## 2013-07-12 MED ORDER — BUPIVACAINE HCL (PF) 0.5 % IJ SOLN
INTRAMUSCULAR | Status: DC | PRN
  Administered 2013-07-12: 20 mL via PERINEURAL

## 2013-07-12 MED ORDER — LIDOCAINE HCL (CARDIAC) 20 MG/ML IV SOLN
INTRAVENOUS | Status: DC | PRN
  Administered 2013-07-12: 40 mg via INTRAVENOUS

## 2013-07-12 MED ORDER — ONDANSETRON HCL 4 MG/2ML IJ SOLN
INTRAMUSCULAR | Status: DC | PRN
  Administered 2013-07-12: 4 mg via INTRAVENOUS

## 2013-07-12 MED ORDER — PROMETHAZINE HCL 25 MG PO TABS: 25.0000 mg | ORAL_TABLET | Freq: Four times a day (QID) | ORAL | Status: DC | PRN

## 2013-07-12 MED ORDER — MIDAZOLAM HCL 2 MG/2ML IJ SOLN
INTRAMUSCULAR | Status: AC
  Filled 2013-07-12: qty 2

## 2013-07-12 MED ORDER — HYDROMORPHONE HCL PF 1 MG/ML IJ SOLN
INTRAMUSCULAR | Status: AC
  Filled 2013-07-12: qty 1

## 2013-07-12 MED ORDER — OXYCODONE HCL 5 MG/5ML PO SOLN: 5.0000 mg | Freq: Once | ORAL | Status: DC | PRN

## 2013-07-12 MED ORDER — SUCCINYLCHOLINE CHLORIDE 20 MG/ML IJ SOLN
INTRAMUSCULAR | Status: DC | PRN
  Administered 2013-07-12: 80 mg via INTRAVENOUS

## 2013-07-12 MED ORDER — METOCLOPRAMIDE HCL 5 MG/ML IJ SOLN
INTRAMUSCULAR | Status: DC | PRN
  Administered 2013-07-12: 10 mg via INTRAVENOUS

## 2013-07-12 MED ORDER — CEFAZOLIN SODIUM-DEXTROSE 2-3 GM-% IV SOLR
2.0000 g | INTRAVENOUS | Status: AC
  Administered 2013-07-12: 2 g via INTRAVENOUS

## 2013-07-12 MED ORDER — CEFAZOLIN SODIUM-DEXTROSE 2-3 GM-% IV SOLR
INTRAVENOUS | Status: AC
  Filled 2013-07-12: qty 50

## 2013-07-12 MED ORDER — OXYCODONE-ACETAMINOPHEN 5-325 MG PO TABS: 1.0000 | ORAL_TABLET | Freq: Four times a day (QID) | ORAL | Status: DC | PRN

## 2013-07-12 MED ORDER — LACTATED RINGERS IV SOLN
INTRAVENOUS | Status: DC
  Administered 2013-07-12 (×2): via INTRAVENOUS

## 2013-07-12 SURGICAL SUPPLY — 68 items
BLADE CUTTER GATOR 3.5 (BLADE) ×2 IMPLANT
BLADE GREAT WHITE 4.2 (BLADE) IMPLANT
BLADE SURG 15 STRL LF DISP TIS (BLADE) IMPLANT
BLADE SURG 15 STRL SS (BLADE)
BUR OVAL 4.0 (BURR) IMPLANT
BUR OVAL 6.0 (BURR) ×2 IMPLANT
CANNULA 5.75X71 LONG (CANNULA) ×2 IMPLANT
CANNULA TWIST IN 8.25X7CM (CANNULA) IMPLANT
DECANTER SPIKE VIAL GLASS SM (MISCELLANEOUS) IMPLANT
DRAPE INCISE IOBAN 66X45 STRL (DRAPES) ×2 IMPLANT
DRAPE SHOULDER BEACH CHAIR (DRAPES) ×2 IMPLANT
DRAPE U 20/CS (DRAPES) ×2 IMPLANT
DRAPE U-SHAPE 47X51 STRL (DRAPES) ×2 IMPLANT
DRSG PAD ABDOMINAL 8X10 ST (GAUZE/BANDAGES/DRESSINGS) ×2 IMPLANT
DURAPREP 26ML APPLICATOR (WOUND CARE) ×2 IMPLANT
ELECT REM PT RETURN 9FT ADLT (ELECTROSURGICAL)
ELECTRODE REM PT RTRN 9FT ADLT (ELECTROSURGICAL) IMPLANT
FIBERSTICK 2 (SUTURE) IMPLANT
GAUZE SPONGE 4X4 12PLY STRL (GAUZE/BANDAGES/DRESSINGS) ×2 IMPLANT
GLOVE BIO SURGEON STRL SZ 6.5 (GLOVE) ×2 IMPLANT
GLOVE BIO SURGEON STRL SZ8 (GLOVE) ×2 IMPLANT
GLOVE BIOGEL PI IND STRL 7.0 (GLOVE) ×1 IMPLANT
GLOVE BIOGEL PI IND STRL 8 (GLOVE) ×2 IMPLANT
GLOVE BIOGEL PI INDICATOR 7.0 (GLOVE) ×1
GLOVE BIOGEL PI INDICATOR 8 (GLOVE) ×2
GLOVE ORTHO TXT STRL SZ7.5 (GLOVE) ×2 IMPLANT
GOWN STRL REUS W/ TWL LRG LVL3 (GOWN DISPOSABLE) ×1 IMPLANT
GOWN STRL REUS W/ TWL XL LVL3 (GOWN DISPOSABLE) ×2 IMPLANT
GOWN STRL REUS W/TWL LRG LVL3 (GOWN DISPOSABLE) ×1
GOWN STRL REUS W/TWL XL LVL3 (GOWN DISPOSABLE) ×2
IMMOBILIZER SHOULDER FOAM XLGE (SOFTGOODS) IMPLANT
KIT BIO-TENODESIS 3X8 DISP (MISCELLANEOUS)
KIT INSRT BABSR STRL DISP BTN (MISCELLANEOUS) IMPLANT
KIT SHOULDER TRACTION (DRAPES) ×2 IMPLANT
MANIFOLD NEPTUNE II (INSTRUMENTS) ×2 IMPLANT
NDL SUT 6 .5 CRC .975X.05 MAYO (NEEDLE) IMPLANT
NEEDLE MAYO TAPER (NEEDLE)
NEEDLE SCORPION MULTI FIRE (NEEDLE) IMPLANT
PACK ARTHROSCOPY DSU (CUSTOM PROCEDURE TRAY) ×2 IMPLANT
PACK BASIN DAY SURGERY FS (CUSTOM PROCEDURE TRAY) ×2 IMPLANT
PENCIL BUTTON HOLSTER BLD 10FT (ELECTRODE) IMPLANT
SET ARTHROSCOPY TUBING (MISCELLANEOUS) ×1
SET ARTHROSCOPY TUBING LN (MISCELLANEOUS) ×1 IMPLANT
SHEET MEDIUM DRAPE 40X70 STRL (DRAPES) ×2 IMPLANT
SLEEVE SCD COMPRESS KNEE MED (MISCELLANEOUS) ×2 IMPLANT
SLING ARM IMMOBILIZER LRG (SOFTGOODS) IMPLANT
SLING ARM IMMOBILIZER MED (SOFTGOODS) IMPLANT
SLING ARM LRG ADULT FOAM STRAP (SOFTGOODS) IMPLANT
SLING ARM MED ADULT FOAM STRAP (SOFTGOODS) IMPLANT
SLING ARM XL FOAM STRAP (SOFTGOODS) IMPLANT
SPONGE LAP 4X18 X RAY DECT (DISPOSABLE) ×2 IMPLANT
STRIP CLOSURE SKIN 1/2X4 (GAUZE/BANDAGES/DRESSINGS) ×2 IMPLANT
SUT FIBERWIRE #2 38 T-5 BLUE (SUTURE)
SUT MNCRL AB 4-0 PS2 18 (SUTURE) ×2 IMPLANT
SUT PDS AB 1 CT  36 (SUTURE)
SUT PDS AB 1 CT 36 (SUTURE) IMPLANT
SUT PROLENE 0 CT 1 CR/8 (SUTURE) IMPLANT
SUT TIGER TAPE 7 IN WHITE (SUTURE) IMPLANT
SUT VIC AB 3-0 SH 27 (SUTURE)
SUT VIC AB 3-0 SH 27X BRD (SUTURE) IMPLANT
SUT VICRYL 3-0 CR8 SH (SUTURE) IMPLANT
SUTURE FIBERWR #2 38 T-5 BLUE (SUTURE) IMPLANT
TAPE FIBER 2MM 7IN #2 BLUE (SUTURE) ×2 IMPLANT
TOWEL OR 17X24 6PK STRL BLUE (TOWEL DISPOSABLE) ×2 IMPLANT
TOWEL OR NON WOVEN STRL DISP B (DISPOSABLE) ×2 IMPLANT
TUBE CONNECTING 20X1/4 (TUBING) IMPLANT
WAND STAR VAC 90 (SURGICAL WAND) ×2 IMPLANT
WATER STERILE IRR 1000ML POUR (IV SOLUTION) ×2 IMPLANT

## 2013-07-12 NOTE — Anesthesia Postprocedure Evaluation (Signed)
Anesthesia Post Note  Patient: Laura Anthony  Procedure(s) Performed: Procedure(s) (LRB): RIGHT SHOULDER ARTHROSCOPY DEBRIDEMENT EXTENSIVE DECOMPRESSION SUBACROMIAL PARTIAL ACROMIOPLASTY (Right)  Anesthesia type: General  Patient location: PACU  Post pain: Pain level controlled  Post assessment: Patient's Cardiovascular Status Stable  Last Vitals:  Filed Vitals:   07/12/13 1530  BP: 114/72  Pulse:   Temp: 36.9 C  Resp: 16    Post vital signs: Reviewed and stable  Level of consciousness: alert  Complications: No apparent anesthesia complications

## 2013-07-12 NOTE — Op Note (Signed)
07/12/2013  1:39 PM  PATIENT:  Laura Anthony    PRE-OPERATIVE DIAGNOSIS:  Right shoulder pain  POST-OPERATIVE DIAGNOSIS:  Right shoulder pain with impingement syndrome, CA ligament fraying, 15% undersurface supraspinatus tear.  PROCEDURE:  Right shoulder arthroscopy with debridement of the undersurface of the supraspinatus, with bursectomy and acromioplasty  SURGEON:  Johnny Bridge, MD  PHYSICIAN ASSISTANT: Joya Gaskins, OPA-C, present and scrubbed throughout the case, critical for completion in a timely fashion, and for retraction, instrumentation, and closure.  ANESTHESIA:   General  PREOPERATIVE INDICATIONS:  Laura Anthony is a  38 y.o. female who had persistent right shoulder pain, possibly after a lacrosse injury from years ago, who failed conservative measures and elected for surgical management.    The risks benefits and alternatives were discussed with the patient preoperatively including but not limited to the risks of infection, bleeding, nerve injury, cardiopulmonary complications, the need for revision surgery, among others, and the patient was willing to proceed. We also discussed the risks for incomplete relief of symptoms, stiffness, among others.  OPERATIVE IMPLANTS: None  OPERATIVE FINDINGS: The shoulder had full motion during examination under anesthesia, and did not have any significant glenohumeral instability. There was some slight clicking with overhead motion during exam.  The glenohumeral articular cartilage was all normal. The biceps tendon was completely normal, and the labrum was pristine both anteriorly, superiorly, and posteriorly. The undersurface of the supraspinatus did have fraying, that was about 15% of the width of the tendon. There was no injury to the biceps pulley, and no subscapularis damage, and no evidence for capsular avulsion on either the anterior humeral or posterior humeral side.  There was not a substantial amount of bursitis, however  the CA ligament did have a very distinct amount of fraying. There was moderate subacromial spurring. The a.c. joint however was normal, and I did not see significant spurring inferiorly.  OPERATIVE PROCEDURE: The patient is brought to the operating room and placed in the supine position. General anesthesia was administered. IV antibiotics were given. The right upper extremity was examined, with the above-named findings noted, and then prepped and draped in usual sterile fashion, positioned in the semilateral decubitus position with all bony prominences padded. Time out was performed. Diagnostic arthroscopy was carried out with the above-named findings noted. The arthroscopic shaver was used to debride the undersurface of the supraspinatus, back to a stable configuration. I also switched portals, to evaluate the entirety of the labrum more thoroughly, and viewed from anteriorly. There was no significant labral tearing. There is a little bit of fraying posteriorly which I debrided. The biceps tendon was pulled within the joint from the groove, and found to be intact.  I then went to the subacromial space, and performed a light bursectomy. The CA ligament was frayed, and I released this with the ArthriCare, and then performed a light acromioplasty using a 6-0 bur.  I inspected the a.c. joint, although did not violate the capsule, and did not see significant abnormality, (the MRI also did not demonstrate significant arthrosis, and clinically she does not have pain in that location,) and so then I removed the arthroscopic instruments, closed the portals with Monocryl followed by Steri-Strips and sterile gauze. She was awakened and returned to the PACU in stable and satisfactory condition. There were no complications and she tolerated the procedure well.

## 2013-07-12 NOTE — Transfer of Care (Signed)
Immediate Anesthesia Transfer of Care Note  Patient: Laura Anthony  Procedure(s) Performed: Procedure(s): RIGHT SHOULDER ARTHROSCOPY DEBRIDEMENT EXTENSIVE DECOMPRESSION SUBACROMIAL PARTIAL ACROMIOPLASTY (Right)  Patient Location: PACU  Anesthesia Type:GA combined with regional for post-op pain  Level of Consciousness: awake and patient cooperative  Airway & Oxygen Therapy: Patient Spontanous Breathing and Patient connected to face mask oxygen  Post-op Assessment: Report given to PACU RN and Post -op Vital signs reviewed and stable  Post vital signs: Reviewed and stable  Complications: No apparent anesthesia complications

## 2013-07-12 NOTE — Discharge Instructions (Signed)
Diet: As you were doing prior to hospitalization   Shower:  May shower but keep the wounds dry, use an occlusive plastic wrap, NO SOAKING IN TUB.  If the bandage gets wet, change with a clean dry gauze.  Dressing:  You may change your dressing 3-5 days after surgery.  Then change the dressing daily with sterile gauze dressing.    There are sticky tapes (steri-strips) on your wounds and all the stitches are absorbable.  Leave the steri-strips in place when changing your dressings, they will peel off with time, usually 2-3 weeks.  Activity:  Increase activity slowly as tolerated, but follow the weight bearing instructions below.  No lifting or driving for 6 weeks.  Weight Bearing:   As tolerated.  OK to remove sling when comfortable.    To prevent constipation: you may use a stool softener such as -  Colace (over the counter) 100 mg by mouth twice a day  Drink plenty of fluids (prune juice may be helpful) and high fiber foods Miralax (over the counter) for constipation as needed.    Itching:  If you experience itching with your medications, try taking only a single pain pill, or even half a pain pill at a time.  You may take up to 10 pain pills per day, and you can also use benadryl over the counter for itching or also to help with sleep.   Precautions:  If you experience chest pain or shortness of breath - call 911 immediately for transfer to the hospital emergency department!!  If you develop a fever greater that 101 F, purulent drainage from wound, increased redness or drainage from wound, or calf pain -- Call the office at [phone]                                                Follow- Up Appointment:  Please call for an appointment to be seen in 2 weeks [place] - [phone]   Regional Anesthesia Blocks  1. Numbness or the inability to move the "blocked" extremity may last from 3-48 hours after placement. The length of time depends on the medication injected and your  individual response to the medication. If the numbness is not going away after 48 hours, call your surgeon.  2. The extremity that is blocked will need to be protected until the numbness is gone and the  Strength has returned. Because you cannot feel it, you will need to take extra care to avoid injury. Because it may be weak, you may have difficulty moving it or using it. You may not know what position it is in without looking at it while the block is in effect.  3. For blocks in the legs and feet, returning to weight bearing and walking needs to be done carefully. You will need to wait until the numbness is entirely gone and the strength has returned. You should be able to move your leg and foot normally before you try and bear weight or walk. You will need someone to be with you when you first try to ensure you do not fall and possibly risk injury.  4. Bruising and tenderness at the needle site are common side effects and will resolve in a few days.  5. Persistent numbness or new problems with movement should be communicated to the surgeon or the [facility] (  [phone])/ [place] [phone]).   Post Anesthesia Home Care Instructions  Activity: Get plenty of rest for the remainder of the day. A responsible adult should stay with you for 24 hours following the procedure.  For the next 24 hours, DO NOT: -Drive a car -Paediatric nurse -Drink alcoholic beverages -Take any medication unless instructed by your physician -Make any legal decisions or sign important papers.  Meals: Start with liquid foods such as gelatin or soup. Progress to regular foods as tolerated. Avoid greasy, spicy, heavy foods. If nausea and/or vomiting occur, drink only clear liquids until the nausea and/or vomiting subsides. Call your physician if vomiting continues.  Special Instructions/Symptoms: Your throat may feel dry or sore from the anesthesia or the breathing tube placed in your  throat during surgery. If this causes discomfort, gargle with warm salt water. The discomfort should disappear within 24 hours.

## 2013-07-12 NOTE — Progress Notes (Signed)
Assisted Dr. Frederick with right, ultrasound guided, interscalene  block. Side rails up, monitors on throughout procedure. See vital signs in flow sheet. Tolerated Procedure well. 

## 2013-07-12 NOTE — Anesthesia Procedure Notes (Addendum)
Anesthesia Regional Block:  Interscalene brachial plexus block  Pre-Anesthetic Checklist: ,, timeout performed, Correct Patient, Correct Site, Correct Laterality, Correct Procedure, Correct Position, site marked, Risks and benefits discussed,  Surgical consent,  Pre-op evaluation,  At surgeon's request and post-op pain management  Laterality: Right  Prep: chloraprep       Needles:   Needle Type: Other     Needle Length: 9cm 9 cm Needle Gauge: 21 and 21 G    Additional Needles:  Procedures: ultrasound guided (picture in chart) Interscalene brachial plexus block Narrative:  Start time: 07/12/2013 12:00 PM End time: 07/12/2013 12:07 PM Injection made incrementally with aspirations every 5 mL.  Performed by: Personally  Anesthesiologist: Nada Libman, MD  Additional Notes: Ultrasound guidance used to: id relevant anatomy, confirm needle position, local anesthetic spread, avoidance of vascular puncture. Picture saved. No complications. Block performed personally by Jessy Oto. Albertina Parr, MD     Procedure Name: Intubation Performed by: Tawni Millers Pre-anesthesia Checklist: Patient identified, Emergency Drugs available, Suction available and Patient being monitored Patient Re-evaluated:Patient Re-evaluated prior to inductionOxygen Delivery Method: Circle System Utilized Preoxygenation: Pre-oxygenation with 100% oxygen Intubation Type: IV induction Ventilation: Mask ventilation without difficulty Laryngoscope Size: Mac and 3 Grade View: Grade I Tube type: Oral Tube size: 7.0 mm Number of attempts: 1 Airway Equipment and Method: stylet and oral airway Placement Confirmation: ETT inserted through vocal cords under direct vision,  positive ETCO2 and breath sounds checked- equal and bilateral Tube secured with: Tape Dental Injury: Teeth and Oropharynx as per pre-operative assessment

## 2013-07-12 NOTE — Anesthesia Preprocedure Evaluation (Addendum)
Anesthesia Evaluation  Patient identified by MRN, date of birth, ID band Patient awake    Reviewed: Allergy & Precautions, H&P , NPO status , Patient's Chart, lab work & pertinent test results  History of Anesthesia Complications (+) history of anesthetic complications  Airway Mallampati: I TM Distance: >3 FB Neck ROM: full    Dental  (+) Teeth Intact, Dental Advidsory Given   Pulmonary neg pulmonary ROS,    Pulmonary exam normal       Cardiovascular negative cardio ROS      Neuro/Psych PSYCHIATRIC DISORDERS negative neurological ROS     GI/Hepatic negative GI ROS, Neg liver ROS,   Endo/Other  negative endocrine ROS  Renal/GU negative Renal ROS     Musculoskeletal   Abdominal Normal abdominal exam  (+)   Peds  Hematology   Anesthesia Other Findings   Reproductive/Obstetrics negative OB ROS                         Anesthesia Physical Anesthesia Plan  ASA: II  Anesthesia Plan: General ETT   Post-op Pain Management:    Induction:   Airway Management Planned:   Additional Equipment:   Intra-op Plan:   Post-operative Plan:   Informed Consent: I have reviewed the patients History and Physical, chart, labs and discussed the procedure including the risks, benefits and alternatives for the proposed anesthesia with the patient or authorized representative who has indicated his/her understanding and acceptance.   Dental Advisory Given  Plan Discussed with: Anesthesiologist, CRNA and Surgeon  Anesthesia Plan Comments:     Anesthesia Quick Evaluation

## 2013-07-12 NOTE — H&P (Signed)
PREOPERATIVE H&P  Chief Complaint: RIGHT SHOULDER DISORDER ARTICULAR CARTILAGE ,IMPINGEMENT SYNDROME,BICIPITAL TENOSYNOVITIS  HPI: Laura Anthony is a 38 y.o. female who presents for preoperative history and physical with a diagnosis of RIGHT SHOULDER DISORDER ARTICULAR CARTILAGE ,IMPINGEMENT SYNDROME,BICIPITAL TENOSYNOVITIS. Symptoms are rated as moderate to severe, and have been worsening.  This is significantly impairing activities of daily living.  She has elected for surgical management. This happened after a couple of years ago when she fell. She has had extended nonsurgical management, including physical therapy, injections, among others. Her preoperative MRI did not demonstrate substantial structural pathology, however her symptoms have been persistent she has failed conservative measures and elected for diagnostic arthroscopy with possible surgical repair of injured structures.   Past Medical History  Diagnosis Date  . PONV (postoperative nausea and vomiting)   . ADD (attention deficit disorder)   . History of seizure     x 1 as a child - was never on anticonvulsants  . 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 Impingement syndrome of right shoulder 07/2013  . Right bicipital tenosynovitis 07/2013   Past Surgical History  Procedure Laterality Date  . Knee arthroscopy w/ acl reconstruction Left   . Adenoidectomy    . Knee arthroscopy Right   . Ankle arthroscopy Right   . Septoplasty with ethmoidectomy, and maxillary antrostomy  10/29/2010    bilat. max. antrostomy with left max. stripping; left ant. ethmoidectomy; right total ethmoidectomy; sphenoidotomy  . Wrist arthroscopy  01/17/2012    Procedure: ARTHROSCOPY WRIST;  Surgeon: Tennis Must, MD;  Location: [place] [facility];  Service: Orthopedics;  Laterality: Right;  RIGHT WRIST ARTHROSCOPY WITH TRIANGULAR FIBROCARTILAGE COMPLEX REPAIR AND DEBRIDEMENT    History   Social History  .  Marital Status: Married    Spouse Name: N/A    Number of Children: N/A  . Years of Education: N/A   Social History Main Topics  . Smoking status: Never Smoker   . Smokeless tobacco: Never Used  . Alcohol Use: Yes     Comment: socially  . Drug Use: No  . Sexual Activity: None   Other Topics Concern  . None   Social History Narrative  . None   History reviewed. No pertinent family history. Allergies  Allergen Reactions  . Morphine And Related Shortness Of Breath and Itching   Prior to Admission medications   Medication Sig Start Date End Date Taking? Authorizing Provider  lisdexamfetamine (VYVANSE) 30 MG capsule Take 30 mg by mouth daily.   Yes Historical Provider, MD     Positive ROS: All other systems have been reviewed and were otherwise negative with the exception of those mentioned in the HPI and as above.  Physical Exam: General: Alert, no acute distress Cardiovascular: No pedal edema Respiratory: No cyanosis, no use of accessory musculature GI: No organomegaly, abdomen is soft and non-tender Skin: No lesions in the area of chief complaint Neurologic: Sensation intact distally Psychiatric: Patient is competent for consent with normal mood and affect Lymphatic: No axillary or cervical lymphadenopathy  MUSCULOSKELETAL: Right shoulder has positive pain with motion, but she does have essentially full motion. Minimal impingement signs, and a positive anterior pain.  Assessment: RIGHT SHOULDER DISORDER ARTICULAR CARTILAGE ,IMPINGEMENT SYNDROME,BICIPITAL TENOSYNOVITIS  Plan: Plan for Procedure(s): RIGHT SHOULDER ARTHROSCOPY DEBRIDEMENT EXTENSIVE DECOMPRESSION SUBACROMIAL PARTIAL ACROMIOPLASTY WITH POSSIBLE BICEPS TENODESIS  The risks benefits and alternatives were discussed with the patient including but not limited to the risks of nonoperative treatment,  versus surgical intervention including infection, bleeding, nerve injury,  blood clots, cardiopulmonary  complications, morbidity, mortality, among others, and they were willing to proceed. We also discussed at length the risks and that this may be diagnostic arthroscopy, and improvement of symptoms is not a guarantee.  Johnny Bridge, MD [facility] [phone]   07/12/2013 9:55 AM

## 2013-07-15 ENCOUNTER — Encounter (HOSPITAL_BASED_OUTPATIENT_CLINIC_OR_DEPARTMENT_OTHER): Payer: Self-pay | Admitting: Orthopedic Surgery

## 2013-09-09 DIAGNOSIS — F329 Major depressive disorder, single episode, unspecified: Secondary | ICD-10-CM | POA: Insufficient documentation

## 2013-09-09 DIAGNOSIS — F32A Depression, unspecified: Secondary | ICD-10-CM | POA: Insufficient documentation

## 2013-10-22 DIAGNOSIS — E785 Hyperlipidemia, unspecified: Secondary | ICD-10-CM | POA: Insufficient documentation

## 2014-05-29 DIAGNOSIS — G43009 Migraine without aura, not intractable, without status migrainosus: Secondary | ICD-10-CM | POA: Insufficient documentation

## 2014-06-26 ENCOUNTER — Other Ambulatory Visit: Payer: Self-pay | Admitting: Obstetrics and Gynecology

## 2014-06-27 LAB — CYTOLOGY - PAP

## 2015-02-01 DIAGNOSIS — I82409 Acute embolism and thrombosis of unspecified deep veins of unspecified lower extremity: Secondary | ICD-10-CM

## 2015-02-01 HISTORY — DX: Acute embolism and thrombosis of unspecified deep veins of unspecified lower extremity: I82.409

## 2015-04-22 ENCOUNTER — Other Ambulatory Visit: Payer: Self-pay | Admitting: Obstetrics and Gynecology

## 2015-04-22 DIAGNOSIS — N63 Unspecified lump in unspecified breast: Secondary | ICD-10-CM

## 2015-04-28 ENCOUNTER — Ambulatory Visit
Admission: RE | Admit: 2015-04-28 | Discharge: 2015-04-28 | Disposition: A | Discharge status: Home / Self Care | Payer: 59 | Source: Ambulatory Visit | Attending: Obstetrics and Gynecology | Admitting: Obstetrics and Gynecology

## 2015-04-28 DIAGNOSIS — N63 Unspecified lump in unspecified breast: Secondary | ICD-10-CM

## 2015-05-03 ENCOUNTER — Emergency Department (HOSPITAL_COMMUNITY)
Admission: EM | Admit: 2015-05-03 | Discharge: 2015-05-03 | Disposition: A | Discharge status: Home / Self Care | Payer: 59 | Attending: Emergency Medicine | Admitting: Emergency Medicine

## 2015-05-03 DIAGNOSIS — R079 Chest pain, unspecified: Secondary | ICD-10-CM | POA: Insufficient documentation

## 2015-05-03 DIAGNOSIS — R0602 Shortness of breath: Secondary | ICD-10-CM | POA: Insufficient documentation

## 2015-05-03 DIAGNOSIS — M79662 Pain in left lower leg: Secondary | ICD-10-CM | POA: Diagnosis present

## 2015-05-03 NOTE — ED Notes (Signed)
Pt c/o L calf pain onset x 3 wks, denies injury to the area,  No swelling, redness or warmth to the area, pt reports SOB & sharp stabbing pain in L ribcage area with deep inspiration while watching tv today, pt reports symptoms lasting 45 mins, denies current symptoms, denies recent air travel, pt takes oral contraceptives, pt A&O x4

## 2015-05-03 NOTE — ED Notes (Signed)
Pt leaving  She reports that  Her pain is better and she will return if the pain gets worse  She has had this pain for 3 weeks

## 2015-05-22 DIAGNOSIS — N6321 Unspecified lump in the left breast, upper outer quadrant: Secondary | ICD-10-CM | POA: Insufficient documentation

## 2016-10-01 HISTORY — PX: BREAST BIOPSY: SHX20

## 2016-10-17 ENCOUNTER — Other Ambulatory Visit: Payer: Self-pay | Admitting: Obstetrics and Gynecology

## 2016-10-17 DIAGNOSIS — N63 Unspecified lump in unspecified breast: Secondary | ICD-10-CM

## 2016-10-20 ENCOUNTER — Other Ambulatory Visit: Payer: Self-pay | Admitting: Obstetrics and Gynecology

## 2016-10-20 ENCOUNTER — Ambulatory Visit
Admission: RE | Admit: 2016-10-20 | Discharge: 2016-10-20 | Disposition: A | Discharge status: Home / Self Care | Payer: 59 | Source: Ambulatory Visit | Attending: Obstetrics and Gynecology | Admitting: Obstetrics and Gynecology

## 2016-10-20 DIAGNOSIS — N63 Unspecified lump in unspecified breast: Secondary | ICD-10-CM

## 2016-10-20 DIAGNOSIS — N6320 Unspecified lump in the left breast, unspecified quadrant: Secondary | ICD-10-CM

## 2016-10-21 ENCOUNTER — Other Ambulatory Visit: Payer: Self-pay | Admitting: Obstetrics and Gynecology

## 2016-10-21 DIAGNOSIS — N6320 Unspecified lump in the left breast, unspecified quadrant: Secondary | ICD-10-CM

## 2016-10-24 ENCOUNTER — Ambulatory Visit
Admission: RE | Admit: 2016-10-24 | Discharge: 2016-10-24 | Disposition: A | Discharge status: Home / Self Care | Payer: 59 | Source: Ambulatory Visit | Attending: Obstetrics and Gynecology | Admitting: Obstetrics and Gynecology

## 2016-10-24 ENCOUNTER — Other Ambulatory Visit: Payer: Self-pay | Admitting: Obstetrics and Gynecology

## 2016-10-24 DIAGNOSIS — N6320 Unspecified lump in the left breast, unspecified quadrant: Secondary | ICD-10-CM

## 2016-10-25 ENCOUNTER — Other Ambulatory Visit: Payer: Self-pay | Admitting: Obstetrics and Gynecology

## 2016-10-25 DIAGNOSIS — Z853 Personal history of malignant neoplasm of breast: Secondary | ICD-10-CM

## 2016-10-26 ENCOUNTER — Telehealth: Payer: Self-pay | Admitting: *Deleted

## 2016-10-26 ENCOUNTER — Telehealth: Payer: Self-pay | Admitting: Hematology and Oncology

## 2016-10-26 ENCOUNTER — Other Ambulatory Visit: Payer: Self-pay | Admitting: Obstetrics and Gynecology

## 2016-10-26 DIAGNOSIS — Z853 Personal history of malignant neoplasm of breast: Secondary | ICD-10-CM

## 2016-10-26 DIAGNOSIS — C50919 Malignant neoplasm of unspecified site of unspecified female breast: Secondary | ICD-10-CM

## 2016-10-26 NOTE — Telephone Encounter (Signed)
Confirmed [facility] appointment for 10/3 in the afternoon, patient would like intake form emailed to her

## 2016-10-26 NOTE — Telephone Encounter (Signed)
Left vm for pt to return call regarding appt with Dr. Lindi Adie on 9/27 at 2pm. Contact information provided.

## 2016-10-26 NOTE — Telephone Encounter (Signed)
Confirmed appt with Dr. Lindi Adie on  9/27 at 2pm. Gave instructions and directions

## 2016-10-27 ENCOUNTER — Ambulatory Visit (HOSPITAL_BASED_OUTPATIENT_CLINIC_OR_DEPARTMENT_OTHER): Payer: 59 | Admitting: Hematology and Oncology

## 2016-10-27 ENCOUNTER — Ambulatory Visit
Admission: RE | Admit: 2016-10-27 | Discharge: 2016-10-27 | Disposition: A | Discharge status: Home / Self Care | Payer: 59 | Source: Ambulatory Visit | Attending: Obstetrics and Gynecology | Admitting: Obstetrics and Gynecology

## 2016-10-27 ENCOUNTER — Telehealth: Payer: Self-pay | Admitting: Hematology and Oncology

## 2016-10-27 ENCOUNTER — Other Ambulatory Visit: Payer: Self-pay | Admitting: General Surgery

## 2016-10-27 ENCOUNTER — Encounter: Payer: Self-pay | Admitting: *Deleted

## 2016-10-27 VITALS — BP 121/80 | HR 70 | Temp 98.7°F | Resp 18 | Ht 67.0 in | Wt 135.4 lb

## 2016-10-27 DIAGNOSIS — C50512 Malignant neoplasm of lower-outer quadrant of left female breast: Secondary | ICD-10-CM | POA: Insufficient documentation

## 2016-10-27 DIAGNOSIS — Z853 Personal history of malignant neoplasm of breast: Secondary | ICD-10-CM

## 2016-10-27 DIAGNOSIS — Z171 Estrogen receptor negative status [ER-]: Secondary | ICD-10-CM | POA: Diagnosis not present

## 2016-10-27 MED ORDER — LORAZEPAM 0.5 MG PO TABS: 0.5000 mg | ORAL_TABLET | Freq: Every day | ORAL | 0 refills | Status: DC

## 2016-10-27 MED ORDER — LIDOCAINE-PRILOCAINE 2.5-2.5 % EX CREA: TOPICAL_CREAM | CUTANEOUS | 3 refills | Status: DC

## 2016-10-27 MED ORDER — DEXAMETHASONE 4 MG PO TABS: 4.0000 mg | ORAL_TABLET | Freq: Every day | ORAL | 0 refills | Status: DC

## 2016-10-27 MED ORDER — ONDANSETRON HCL 8 MG PO TABS: 8.0000 mg | ORAL_TABLET | Freq: Two times a day (BID) | ORAL | 1 refills | Status: DC | PRN

## 2016-10-27 MED ORDER — PROCHLORPERAZINE MALEATE 10 MG PO TABS: 10.0000 mg | ORAL_TABLET | Freq: Four times a day (QID) | ORAL | 1 refills | Status: DC | PRN

## 2016-10-27 NOTE — Telephone Encounter (Signed)
Scheduled appts per 9/27 los did not want avs and calendar

## 2016-10-27 NOTE — Progress Notes (Signed)
START ON PATHWAY REGIMEN - Breast     A cycle is every 21 days:     Pertuzumab      Pertuzumab      Trastuzumab      Trastuzumab      Carboplatin      Docetaxel   **Always confirm dose/schedule in your pharmacy ordering system**    Patient Characteristics: Preoperative or Nonsurgical Candidate (Clinical Staging), Neoadjuvant Therapy followed by Surgery, Invasive Disease, Chemotherapy, HER2 Positive, ER Negative/Unknown Therapeutic Status: Preoperative or Nonsurgical Candidate (Clinical Staging) AJCC M Category: cM0 AJCC Grade: G2 Breast Surgical Plan: Neoadjuvant Therapy followed by Surgery ER Status: Negative (-) AJCC 8 Stage Grouping: IIA HER2 Status: Positive (+) AJCC T Category: cT1c AJCC N Category: cN1 PR Status: Negative (-) Intent of Therapy: Curative Intent, Discussed with Patient

## 2016-10-27 NOTE — Progress Notes (Signed)
[facility] CONSULT NOTE  Patient Care Team: [name], MD as PCP - General (Family Medicine)  CHIEF COMPLAINTS/PURPOSE OF CONSULTATION:  Newly diagnosed breast cancer  HISTORY OF PRESENTING ILLNESS:  Laura Anthony 41 y.o. female is here because of recent diagnosis of left breast cancer. Patient had 2 palpable abnormalities 1 in the lower inner aspect of the left breast which the patient felt herself for about a month and another one in the lower outer left breast which the patient's physician felt. Because of this she underwent a bilateral diagnostic mammograms which identified multiple lesions in the left breast. This was evaluated by ultrasound and she was noted to have 1.7 cm mass at 4:00 position, 5 mm mass 6:30 position an 11 mm mass at 6:00 position along with a slightly thick and x-ray lymph node. Because of this she underwent biopsies of the 2 lesions in the breast and today she had a biopsy of the lymph node. 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 She is here today to discuss the treatment plan.  I reviewed her records extensively and collaborated the history with the patient.  SUMMARY OF ONCOLOGIC HISTORY:   Malignant neoplasm of lower-outer quadrant of left breast of female, estrogen receptor negative ([place])   10/20/2016 Mammogram    Mammogram and ultrasound of the left breast revealed 1.7 cm mass at 4:00 position, 6:30 position 5 x 4 x 4 mm mass, 6:00 position 5 cm nipple 7 x 6 x 11 mm, left axillary lymph node with thickened cortex, T1c N1 stage II a AJCC 8      10/24/2016 Initial Diagnosis    Left breast biopsy 6:30 position 3 cm from nipple: IDC grade 2, DCIS, ER 0%, PR 0%, Ki-67 15% HER-2 positive ratio 2.1; 4:00 position 3 cm from nipple: IDC grade 2, DCIS, ER 0%, PR 0%, Ki-67 35%, HER-2 positive ratio 2.02      MEDICAL HISTORY:  Past Medical  History:  Diagnosis Date  . ADD (attention deficit disorder)   . 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 History of seizure    x 1 as a child - was never on anticonvulsants  . Impingement syndrome of right shoulder 07/2013  . PONV (postoperative nausea and vomiting)   . Right bicipital tenosynovitis 07/2013  . Rotator cuff impingement syndrome of left shoulder 07/12/2013    SURGICAL HISTORY: Past Surgical History:  Procedure Laterality Date  . ADENOIDECTOMY    . ANKLE ARTHROSCOPY Right   . KNEE ARTHROSCOPY Right   . KNEE ARTHROSCOPY W/ ACL RECONSTRUCTION Left   . SEPTOPLASTY WITH ETHMOIDECTOMY, AND MAXILLARY ANTROSTOMY  10/29/2010   bilat. max. antrostomy with left max. stripping; left ant. ethmoidectomy; right total ethmoidectomy; sphenoidotomy  . SHOULDER ARTHROSCOPY WITH SUBACROMIAL DECOMPRESSION AND BICEP TENDON REPAIR Right 07/12/2013   Procedure: RIGHT SHOULDER ARTHROSCOPY DEBRIDEMENT EXTENSIVE DECOMPRESSION SUBACROMIAL PARTIAL ACROMIOPLASTY;  Surgeon: Johnny Bridge, MD;  Location: [facility];  Service: Orthopedics;  Laterality: Right;  . WRIST ARTHROSCOPY  01/17/2012   Procedure: ARTHROSCOPY WRIST;  Surgeon: Tennis Must, MD;  Location: [facility];  Service: Orthopedics;  Laterality: Right;  RIGHT WRIST ARTHROSCOPY WITH TRIANGULAR FIBROCARTILAGE COMPLEX REPAIR AND DEBRIDEMENT     SOCIAL HISTORY: Social History   Social History  . Marital status: Married    Spouse name: N/A  .  Number of children: N/A  . Years of education: N/A   Occupational History  . Not on file.   Social History Main Topics  . Smoking status: Never Smoker  . Smokeless tobacco: Never Used  . Alcohol use Yes     Comment: socially  . Drug use: No  . Sexual activity: Not on file   Other Topics Concern  . Not on file   Social History Narrative  . No narrative on file    FAMILY HISTORY: Family History  Problem Relation Age of  Onset  . Breast cancer Mother     ALLERGIES:  is allergic to morphine and related.  MEDICATIONS:  Current Outpatient Prescriptions  Medication Sig Dispense Refill  . dexamethasone (DECADRON) 4 MG tablet Take 1 tablet (4 mg total) by mouth daily. Date 1 tablet daily before chemotherapy and 1 tablet the day after 12 tablet 0  . lidocaine-prilocaine (EMLA) cream Apply to affected area once 30 g 3  . lisdexamfetamine (VYVANSE) 30 MG capsule Take 30 mg by mouth daily.    Marland Kitchen LORazepam (ATIVAN) 0.5 MG tablet Take 1 tablet (0.5 mg total) by mouth at bedtime. As needed for sleep 30 tablet 0  . methocarbamol (ROBAXIN) 500 MG tablet Take 1 tablet (500 mg total) by mouth 4 (four) times daily. 75 tablet 1  . ondansetron (ZOFRAN) 8 MG tablet Take 1 tablet (8 mg total) by mouth 2 (two) times daily as needed for refractory nausea / vomiting. Start on day 3 after chemo. 30 tablet 1  . oxyCODONE-acetaminophen (ROXICET) 5-325 MG per tablet Take 1-2 tablets by mouth every 6 (six) hours as needed for severe pain. 75 tablet 0  . prochlorperazine (COMPAZINE) 10 MG tablet Take 1 tablet (10 mg total) by mouth every 6 (six) hours as needed (Nausea or vomiting). 30 tablet 1  . promethazine (PHENERGAN) 25 MG tablet Take 1 tablet (25 mg total) by mouth every 6 (six) hours as needed for nausea or vomiting. 30 tablet 0  . sennosides-docusate sodium (SENOKOT-S) 8.6-50 MG tablet Take 2 tablets by mouth daily. 30 tablet 1   No current facility-administered medications for this visit.     REVIEW OF SYSTEMS:   Constitutional: Denies fevers, chills or abnormal night sweats Eyes: Denies blurriness of vision, double vision or watery eyes Ears, nose, mouth, throat, and face: Denies mucositis or sore throat Respiratory: Denies cough, dyspnea or wheezes Cardiovascular: Denies palpitation, chest discomfort or lower extremity swelling Gastrointestinal:  Denies nausea, heartburn or change in bowel habits Skin: Denies abnormal skin  rashes Lymphatics: Denies new lymphadenopathy or easy bruising Neurological:Denies numbness, tingling or new weaknesses Behavioral/Psych: Mood is stable, no new changes  Breast: Palpable lumps in the left breast All other systems were reviewed with the patient and are negative.  PHYSICAL EXAMINATION: ECOG PERFORMANCE STATUS: 1 - Symptomatic but completely ambulatory  Vitals:   10/27/16 1431  BP: 121/80  Pulse: 70  Resp: 18  Temp: 98.7 F (37.1 C)  SpO2: 100%   Filed Weights   10/27/16 1431  Weight: 135 lb 6.4 oz (61.4 kg)    GENERAL:alert, no distress and comfortable SKIN: skin color, texture, turgor are normal, no rashes or significant lesions EYES: normal, conjunctiva are pink and non-injected, sclera clear OROPHARYNX:no exudate, no erythema and lips, buccal mucosa, and tongue normal  NECK: supple, thyroid normal size, non-tender, without nodularity LYMPH:  no palpable lymphadenopathy in the cervical, axillary or inguinal LUNGS: clear to auscultation and percussion with normal breathing effort HEART:  regular rate & rhythm and no murmurs and no lower extremity edema ABDOMEN:abdomen soft, non-tender and normal bowel sounds Musculoskeletal:no cyanosis of digits and no clubbing  PSYCH: alert & oriented x 3 with fluent speech NEURO: no focal motor/sensory deficits BREAST: Palpable lumps in the left breast. No palpable axillary or supraclavicular lymphadenopathy (exam performed in the presence of a chaperone)   LABORATORY DATA:  I have reviewed the data as listed Lab Results  Component Value Date   HGB 14.3 10/19/2010   HCT 42.0 10/19/2010   Lab Results  Component Value Date   NA 139 10/19/2010   K 3.5 10/19/2010   CL 103 10/19/2010    RADIOGRAPHIC STUDIES: I have personally reviewed the radiological reports and agreed with the findings in the report.  ASSESSMENT AND PLAN:  Malignant neoplasm of lower-outer quadrant of left breast of female, estrogen receptor  negative ([place]) Mammogram and ultrasound of the left breast revealed 1.7 cm mass at 4:00 position, 6:30 position 5 x 4 x 4 mm mass, 6:00 position 5 cm nipple 7 x 6 x 11 mm, left axillary lymph node with thickened cortex, T1c N1 stage II a AJCC 8  10/24/2016: Left breast biopsy 6:30 position 3 cm from nipple: IDC grade 2, DCIS, ER 0%, PR 0%, Ki-67 15% HER-2 positive ratio 2.1; 4:00 position 3 cm from nipple: IDC grade 2, DCIS, ER 0%, PR 0%, Ki-67 35%, HER-2 positive ratio 2.02  Pathology and radiology counseling: Discussed with the patient, the details of pathology including the type of breast cancer,the clinical staging, the significance of ER, PR and HER-2/neu receptors and the implications for treatment. After reviewing the pathology in detail, we proceeded to discuss the different treatment options between surgery, radiation, chemotherapy, antiestrogen therapies.  Recommendation: Genetic counseling 1. Neoadjuvant chemotherapy with TCH vs TCHP depending on the lymph node biopsy result. This would be followed by Herceptin maintenance for 1 year. 2. followed by breast conserving surgery if it is feasible 3. Followed by adjuvant radiation  Chemotherapy Counseling: I discussed the risks and benefits of chemotherapy including the risks of nausea/ vomiting, risk of infection from low WBC count, fatigue due to chemo or anemia, bruising or bleeding due to low platelets, mouth sores, loss/ change in taste and decreased appetite. Liver and kidney function will be monitored through out chemotherapy as abnormalities in liver and kidney function may be a side effect of treatment. Cardiac dysfunction due to Herceptin and Perjeta were discussed in detail. Risk of permanent bone marrow dysfunction and leukemia due to chemo were also discussed.  Return to clinic to start chemotherapy. We will need an echocardiogram, port placement, chemotherapy class, prior to starting treatment.  All questions were answered. The  patient knows to call the clinic with any problems, questions or concerns.    Rulon Eisenmenger, MD 10/27/16

## 2016-10-27 NOTE — Assessment & Plan Note (Addendum)
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 After reviewing the pathology in detail, we proceeded to discuss the different treatment options between surgery, radiation, chemotherapy, antiestrogen therapies.  Recommendation: 1. Neoadjuvant chemotherapy with TCH vs TCHP depending on the lymph node biopsy result. This would be followed by Herceptin maintenance for 1 year. 2. followed by breast conserving surgery if it is feasible 3. Followed by adjuvant radiation  Chemotherapy Counseling: I discussed the risks and benefits of chemotherapy including the risks of nausea/ vomiting, risk of infection from low WBC count, fatigue due to chemo or anemia, bruising or bleeding due to low platelets, mouth sores, loss/ change in taste and decreased appetite. Liver and kidney function will be monitored through out chemotherapy as abnormalities in liver and kidney function may be a side effect of treatment. Cardiac dysfunction due to Herceptin and Perjeta were discussed in detail. Risk of permanent bone marrow dysfunction and leukemia due to chemo were also discussed.  Return to clinic to start chemotherapy. We will need an echocardiogram, port placement, chemotherapy class, prior to starting treatment.

## 2016-10-28 ENCOUNTER — Other Ambulatory Visit: Payer: Self-pay

## 2016-10-28 DIAGNOSIS — Z171 Estrogen receptor negative status [ER-]: Secondary | ICD-10-CM

## 2016-10-28 DIAGNOSIS — C50512 Malignant neoplasm of lower-outer quadrant of left female breast: Secondary | ICD-10-CM

## 2016-10-28 MED ORDER — LIDOCAINE-PRILOCAINE 2.5-2.5 % EX CREA: TOPICAL_CREAM | CUTANEOUS | 3 refills | Status: DC

## 2016-10-28 MED ORDER — PROCHLORPERAZINE MALEATE 10 MG PO TABS: 10.0000 mg | ORAL_TABLET | Freq: Four times a day (QID) | ORAL | 1 refills | Status: DC | PRN

## 2016-10-28 MED ORDER — DEXAMETHASONE 4 MG PO TABS: 4.0000 mg | ORAL_TABLET | Freq: Every day | ORAL | 0 refills | Status: DC

## 2016-10-28 MED ORDER — LORAZEPAM 0.5 MG PO TABS: 0.5000 mg | ORAL_TABLET | Freq: Every day | ORAL | 0 refills | Status: DC

## 2016-10-28 MED ORDER — ONDANSETRON HCL 8 MG PO TABS: 8.0000 mg | ORAL_TABLET | Freq: Two times a day (BID) | ORAL | 1 refills | Status: DC | PRN

## 2016-10-31 ENCOUNTER — Encounter (HOSPITAL_COMMUNITY): Payer: Self-pay | Admitting: *Deleted

## 2016-11-01 ENCOUNTER — Ambulatory Visit (HOSPITAL_COMMUNITY): Payer: 59

## 2016-11-01 ENCOUNTER — Ambulatory Visit (HOSPITAL_COMMUNITY): Payer: 59 | Admitting: Anesthesiology

## 2016-11-01 ENCOUNTER — Encounter (HOSPITAL_COMMUNITY)
Admission: RE | Disposition: A | Discharge status: Home / Self Care | Payer: Self-pay | Source: Ambulatory Visit | Attending: General Surgery

## 2016-11-01 ENCOUNTER — Encounter (HOSPITAL_COMMUNITY): Payer: Self-pay | Admitting: *Deleted

## 2016-11-01 ENCOUNTER — Encounter: Payer: 59 | Admitting: Genetic Counselor

## 2016-11-01 ENCOUNTER — Ambulatory Visit (HOSPITAL_COMMUNITY)
Admission: RE | Admit: 2016-11-01 | Discharge: 2016-11-01 | Disposition: A | Discharge status: Home / Self Care | Payer: 59 | Source: Ambulatory Visit | Attending: General Surgery | Admitting: General Surgery

## 2016-11-01 ENCOUNTER — Other Ambulatory Visit: Payer: 59

## 2016-11-01 ENCOUNTER — Telehealth: Payer: Self-pay

## 2016-11-01 DIAGNOSIS — Z79899 Other long term (current) drug therapy: Secondary | ICD-10-CM | POA: Insufficient documentation

## 2016-11-01 DIAGNOSIS — G43909 Migraine, unspecified, not intractable, without status migrainosus: Secondary | ICD-10-CM | POA: Diagnosis not present

## 2016-11-01 DIAGNOSIS — Z885 Allergy status to narcotic agent status: Secondary | ICD-10-CM | POA: Diagnosis not present

## 2016-11-01 DIAGNOSIS — Z86718 Personal history of other venous thrombosis and embolism: Secondary | ICD-10-CM | POA: Insufficient documentation

## 2016-11-01 DIAGNOSIS — F419 Anxiety disorder, unspecified: Secondary | ICD-10-CM | POA: Diagnosis not present

## 2016-11-01 DIAGNOSIS — C50912 Malignant neoplasm of unspecified site of left female breast: Secondary | ICD-10-CM | POA: Insufficient documentation

## 2016-11-01 DIAGNOSIS — Z1501 Genetic susceptibility to malignant neoplasm of breast: Secondary | ICD-10-CM | POA: Insufficient documentation

## 2016-11-01 DIAGNOSIS — Z452 Encounter for adjustment and management of vascular access device: Secondary | ICD-10-CM

## 2016-11-01 DIAGNOSIS — C773 Secondary and unspecified malignant neoplasm of axilla and upper limb lymph nodes: Secondary | ICD-10-CM | POA: Insufficient documentation

## 2016-11-01 DIAGNOSIS — Z171 Estrogen receptor negative status [ER-]: Secondary | ICD-10-CM | POA: Diagnosis not present

## 2016-11-01 DIAGNOSIS — F988 Other specified behavioral and emotional disorders with onset usually occurring in childhood and adolescence: Secondary | ICD-10-CM | POA: Diagnosis not present

## 2016-11-01 DIAGNOSIS — Z419 Encounter for procedure for purposes other than remedying health state, unspecified: Secondary | ICD-10-CM

## 2016-11-01 HISTORY — PX: PORTACATH PLACEMENT: SHX2246

## 2016-11-01 HISTORY — DX: Acute embolism and thrombosis of unspecified deep veins of unspecified lower extremity: I82.409

## 2016-11-01 LAB — CBC
HCT: 37.6 % (ref 36.0–46.0)
Hemoglobin: 12.7 g/dL (ref 12.0–15.0)
MCH: 29.8 pg (ref 26.0–34.0)
MCHC: 33.8 g/dL (ref 30.0–36.0)
MCV: 88.3 fL (ref 78.0–100.0)
Platelets: 248 10*3/uL (ref 150–400)
RBC: 4.26 MIL/uL (ref 3.87–5.11)
RDW: 12.3 % (ref 11.5–15.5)
WBC: 5 10*3/uL (ref 4.0–10.5)

## 2016-11-01 LAB — HCG, SERUM, QUALITATIVE: Preg, Serum: NEGATIVE

## 2016-11-01 SURGERY — INSERTION, TUNNELED CENTRAL VENOUS DEVICE, WITH PORT
Anesthesia: General | Site: Chest

## 2016-11-01 MED ORDER — FENTANYL CITRATE (PF) 250 MCG/5ML IJ SOLN
INTRAMUSCULAR | Status: AC
  Filled 2016-11-01: qty 5

## 2016-11-01 MED ORDER — MEPERIDINE HCL 25 MG/ML IJ SOLN: 6.2500 mg | INTRAMUSCULAR | Status: DC | PRN

## 2016-11-01 MED ORDER — ONDANSETRON HCL 4 MG/2ML IJ SOLN: 4.0000 mg | Freq: Once | INTRAMUSCULAR | Status: DC | PRN

## 2016-11-01 MED ORDER — LACTATED RINGERS IV SOLN
INTRAVENOUS | Status: DC
  Administered 2016-11-01 (×3): via INTRAVENOUS

## 2016-11-01 MED ORDER — ACETAMINOPHEN 500 MG PO TABS
1000.0000 mg | ORAL_TABLET | ORAL | Status: AC
  Administered 2016-11-01: 1000 mg via ORAL
  Filled 2016-11-01: qty 2

## 2016-11-01 MED ORDER — MIDAZOLAM HCL 2 MG/2ML IJ SOLN
INTRAMUSCULAR | Status: AC
  Filled 2016-11-01: qty 2

## 2016-11-01 MED ORDER — FENTANYL CITRATE (PF) 100 MCG/2ML IJ SOLN
INTRAMUSCULAR | Status: DC | PRN
  Administered 2016-11-01: 50 ug via INTRAVENOUS
  Administered 2016-11-01: 25 ug via INTRAVENOUS
  Administered 2016-11-01: 50 ug via INTRAVENOUS
  Administered 2016-11-01: 25 ug via INTRAVENOUS

## 2016-11-01 MED ORDER — CEFAZOLIN SODIUM-DEXTROSE 2-4 GM/100ML-% IV SOLN
2.0000 g | INTRAVENOUS | Status: AC
  Administered 2016-11-01: 2 g via INTRAVENOUS
  Filled 2016-11-01: qty 100

## 2016-11-01 MED ORDER — MIDAZOLAM HCL 5 MG/5ML IJ SOLN
INTRAMUSCULAR | Status: DC | PRN
  Administered 2016-11-01: 2 mg via INTRAVENOUS

## 2016-11-01 MED ORDER — HEPARIN SOD (PORK) LOCK FLUSH 100 UNIT/ML IV SOLN
INTRAVENOUS | Status: AC
  Filled 2016-11-01: qty 5

## 2016-11-01 MED ORDER — DEXAMETHASONE SODIUM PHOSPHATE 10 MG/ML IJ SOLN
INTRAMUSCULAR | Status: DC | PRN
  Administered 2016-11-01: 10 mg via INTRAVENOUS

## 2016-11-01 MED ORDER — BUPIVACAINE HCL (PF) 0.25 % IJ SOLN
INTRAMUSCULAR | Status: DC | PRN
  Administered 2016-11-01: 8 mL

## 2016-11-01 MED ORDER — 0.9 % SODIUM CHLORIDE (POUR BTL) OPTIME
TOPICAL | Status: DC | PRN
  Administered 2016-11-01: 1000 mL

## 2016-11-01 MED ORDER — CELECOXIB 200 MG PO CAPS
200.0000 mg | ORAL_CAPSULE | ORAL | Status: AC
Start: 2016-11-01 — End: 2016-11-01
  Administered 2016-11-01: 200 mg via ORAL
  Filled 2016-11-01: qty 1

## 2016-11-01 MED ORDER — PROPOFOL 10 MG/ML IV BOLUS
INTRAVENOUS | Status: DC | PRN
  Administered 2016-11-01: 200 mg via INTRAVENOUS

## 2016-11-01 MED ORDER — SODIUM CHLORIDE 0.9 % IV SOLN
INTRAVENOUS | Status: DC | PRN
  Administered 2016-11-01: 09:00:00

## 2016-11-01 MED ORDER — HYDROMORPHONE HCL 1 MG/ML IJ SOLN: 0.2500 mg | INTRAMUSCULAR | Status: DC | PRN

## 2016-11-01 MED ORDER — BUPIVACAINE HCL (PF) 0.25 % IJ SOLN
INTRAMUSCULAR | Status: AC
  Filled 2016-11-01: qty 30

## 2016-11-01 MED ORDER — PROPOFOL 10 MG/ML IV BOLUS
INTRAVENOUS | Status: AC
  Filled 2016-11-01: qty 20

## 2016-11-01 MED ORDER — SCOPOLAMINE 1 MG/3DAYS TD PT72
MEDICATED_PATCH | TRANSDERMAL | Status: DC | PRN
  Administered 2016-11-01: 1 via TRANSDERMAL

## 2016-11-01 MED ORDER — LIDOCAINE 2% (20 MG/ML) 5 ML SYRINGE
INTRAMUSCULAR | Status: DC | PRN
Start: 2016-11-01 — End: 2016-11-01
  Administered 2016-11-01: 100 mg via INTRAVENOUS

## 2016-11-01 MED ORDER — ONDANSETRON HCL 4 MG/2ML IJ SOLN
INTRAMUSCULAR | Status: DC | PRN
  Administered 2016-11-01 (×2): 4 mg via INTRAVENOUS

## 2016-11-01 MED ORDER — GABAPENTIN 300 MG PO CAPS
300.0000 mg | ORAL_CAPSULE | ORAL | Status: AC
  Administered 2016-11-01: 300 mg via ORAL
  Filled 2016-11-01: qty 1

## 2016-11-01 MED ORDER — HEPARIN SOD (PORK) LOCK FLUSH 100 UNIT/ML IV SOLN
INTRAVENOUS | Status: DC | PRN
  Administered 2016-11-01: 500 [IU] via INTRAVENOUS

## 2016-11-01 SURGICAL SUPPLY — 47 items
BAG DECANTER FOR FLEXI CONT (MISCELLANEOUS) ×2 IMPLANT
BLADE SURG 11 STRL SS (BLADE) ×2 IMPLANT
BLADE SURG 15 STRL LF DISP TIS (BLADE) ×1 IMPLANT
BLADE SURG 15 STRL SS (BLADE) ×1
CHLORAPREP W/TINT 26ML (MISCELLANEOUS) ×2 IMPLANT
COVER SURGICAL LIGHT HANDLE (MISCELLANEOUS) ×2 IMPLANT
COVER TRANSDUCER ULTRASND GEL (DRAPE) ×2 IMPLANT
CRADLE DONUT ADULT HEAD (MISCELLANEOUS) ×2 IMPLANT
DECANTER SPIKE VIAL GLASS SM (MISCELLANEOUS) ×2 IMPLANT
DERMABOND ADVANCED (GAUZE/BANDAGES/DRESSINGS) ×1
DERMABOND ADVANCED .7 DNX12 (GAUZE/BANDAGES/DRESSINGS) ×1 IMPLANT
DRAPE C-ARM 42X72 X-RAY (DRAPES) ×2 IMPLANT
DRAPE CHEST BREAST 15X10 FENES (DRAPES) ×2 IMPLANT
DRAPE UTILITY XL STRL (DRAPES) ×2 IMPLANT
ELECT CAUTERY BLADE 6.4 (BLADE) ×2 IMPLANT
ELECT REM PT RETURN 9FT ADLT (ELECTROSURGICAL) ×2
ELECTRODE REM PT RTRN 9FT ADLT (ELECTROSURGICAL) ×1 IMPLANT
GAUZE SPONGE 4X4 16PLY XRAY LF (GAUZE/BANDAGES/DRESSINGS) ×2 IMPLANT
GEL ULTRASOUND 20GR AQUASONIC (MISCELLANEOUS) ×2 IMPLANT
GLOVE BIO SURGEON STRL SZ7 (GLOVE) ×2 IMPLANT
GLOVE BIOGEL PI IND STRL 7.5 (GLOVE) ×1 IMPLANT
GLOVE BIOGEL PI INDICATOR 7.5 (GLOVE) ×1
GOWN STRL REUS W/ TWL LRG LVL3 (GOWN DISPOSABLE) ×2 IMPLANT
GOWN STRL REUS W/TWL LRG LVL3 (GOWN DISPOSABLE) ×2
INTRODUCER COOK 11FR (CATHETERS) IMPLANT
KIT BASIN OR (CUSTOM PROCEDURE TRAY) ×2 IMPLANT
KIT PORT POWER 8FR ISP CVUE (Miscellaneous) ×2 IMPLANT
KIT ROOM TURNOVER OR (KITS) ×2 IMPLANT
NEEDLE HYPO 25GX1X1/2 BEV (NEEDLE) ×2 IMPLANT
NS IRRIG 1000ML POUR BTL (IV SOLUTION) ×2 IMPLANT
PACK SURGICAL SETUP 50X90 (CUSTOM PROCEDURE TRAY) ×2 IMPLANT
PAD ARMBOARD 7.5X6 YLW CONV (MISCELLANEOUS) ×4 IMPLANT
PENCIL BUTTON HOLSTER BLD 10FT (ELECTRODE) ×2 IMPLANT
SET INTRODUCER 12FR PACEMAKER (SHEATH) IMPLANT
SET SHEATH INTRODUCER 10FR (MISCELLANEOUS) IMPLANT
SHEATH COOK PEEL AWAY SET 9F (SHEATH) IMPLANT
SUT MNCRL AB 4-0 PS2 18 (SUTURE) ×2 IMPLANT
SUT PROLENE 2 0 SH DA (SUTURE) ×2 IMPLANT
SUT SILK 2 0 (SUTURE)
SUT SILK 2-0 18XBRD TIE 12 (SUTURE) IMPLANT
SUT VIC AB 3-0 SH 27 (SUTURE) ×1
SUT VIC AB 3-0 SH 27XBRD (SUTURE) ×1 IMPLANT
SYR 20ML ECCENTRIC (SYRINGE) ×4 IMPLANT
SYR 5ML LUER SLIP (SYRINGE) ×2 IMPLANT
SYR CONTROL 10ML LL (SYRINGE) IMPLANT
TOWEL OR 17X24 6PK STRL BLUE (TOWEL DISPOSABLE) ×2 IMPLANT
TOWEL OR 17X26 10 PK STRL BLUE (TOWEL DISPOSABLE) ×2 IMPLANT

## 2016-11-01 NOTE — Transfer of Care (Signed)
Immediate Anesthesia Transfer of Care Note  Patient: Laura Anthony  Procedure(s) Performed: INSERTION PORT-A-CATH WITH [place] (N/A Chest)  Patient Location: PACU  Anesthesia Type:General  Level of Consciousness: awake, patient cooperative and responds to stimulation  Airway & Oxygen Therapy: Patient Spontanous Breathing and Patient connected to nasal cannula oxygen  Post-op Assessment: Report given to RN and Post -op Vital signs reviewed and stable  Post vital signs: Reviewed and stable  Last Vitals:  Vitals:   11/01/16 0723  BP: 120/89  Pulse: 80  Resp: 19  Temp: 37.1 C  SpO2: 99%    Last Pain:  Vitals:   11/01/16 0723  TempSrc: Oral      Patients Stated Pain Goal: 5 (38/18/29 9371)  Complications: No apparent anesthesia complications

## 2016-11-01 NOTE — Interval H&P Note (Signed)
History and Physical Interval Note:  11/01/2016 9:04 AM  Laura Anthony  has presented today for surgery, with the diagnosis of LEFT BREAST CANCER  The various methods of treatment have been discussed with the patient and family. After consideration of risks, benefits and other options for treatment, the patient has consented to  Procedure(s): INSERTION PORT-A-CATH WITH [place] (N/A) as a surgical intervention .  The patient's history has been reviewed, patient examined, no change in status, stable for surgery.  I have reviewed the patient's chart and labs.  Questions were answered to the patient's satisfaction.     Lorae Roig

## 2016-11-01 NOTE — Discharge Instructions (Signed)
    PORT-A-CATH: POST OP INSTRUCTIONS  Always review your discharge instruction sheet given to you by the facility where your surgery was performed.   1. A prescription for pain medication may be given to you upon discharge. Take your pain medication as prescribed, if needed. If narcotic pain medicine is not needed, then you make take acetaminophen (Tylenol) or ibuprofen (Advil) as needed.  2. Take your usually prescribed medications unless otherwise directed. 3. If you need a refill on your pain medication, please contact our office. All narcotic pain medicine now requires a paper prescription.  Phoned in and fax refills are no longer allowed by law.  Prescriptions will not be filled after 5 pm or on weekends.  4. You should follow a light diet for the remainder of the day after your procedure. 5. Most patients will experience some mild swelling and/or bruising in the area of the incision. It may take several days to resolve. 6. It is common to experience some constipation if taking pain medication after surgery. Increasing fluid intake and taking a stool softener (such as Colace) will usually help or prevent this problem from occurring. A mild laxative (Milk of Magnesia or Miralax) should be taken according to package directions if there are no bowel movements after 48 hours.  7. Unless discharge instructions indicate otherwise, you may remove your bandages 48 hours after surgery, and you may shower at that time. You may have steri-strips (small white skin tapes) in place directly over the incision.  These strips should be left on the skin for 7-10 days.  If your surgeon used Dermabond (skin glue) on the incision, you may shower in 24 hours.  The glue will flake off over the next 2-3 weeks.  8. If your port is left accessed at the end of surgery (needle left in port), the dressing cannot get wet and should only by changed by a healthcare professional. When the port is no longer accessed (when the  needle has been removed), follow step 7.   9. ACTIVITIES:  Limit activity involving your arms for the next 72 hours. Do no strenuous exercise or activity for 1 week. You may drive when you are no longer taking prescription pain medication, you can comfortably wear a seatbelt, and you can maneuver your car. 10.You may need to see your doctor in the office for a follow-up appointment.  Please       check with your doctor.  11.When you receive a new Port-a-Cath, you will get a product guide and        ID card.  Please keep them in case you need them.  WHEN TO CALL YOUR DOCTOR ([phone]): 1. Fever over 101.0 2. Chills 3. Continued bleeding from incision 4. Increased redness and tenderness at the site 5. Shortness of breath, difficulty breathing   The clinic staff is available to answer your questions during regular business hours. Please don't hesitate to call and ask to speak to one of the nurses or medical assistants for clinical concerns. If you have a medical emergency, go to the nearest emergency room or call 911.  A surgeon from [facility] is always on call at the hospital.     For further information, please visit [email or web address]      

## 2016-11-01 NOTE — Anesthesia Preprocedure Evaluation (Signed)
Anesthesia Evaluation  Patient identified by MRN, date of birth, ID band Patient awake    Reviewed: Allergy & Precautions, NPO status , Patient's Chart, lab work & pertinent test results  History of Anesthesia Complications (+) PONV  Airway Mallampati: I  TM Distance: >3 FB Neck ROM: Full    Dental   Pulmonary    Pulmonary exam normal        Cardiovascular Normal cardiovascular exam     Neuro/Psych Anxiety    GI/Hepatic   Endo/Other    Renal/GU      Musculoskeletal   Abdominal   Peds  Hematology   Anesthesia Other Findings   Reproductive/Obstetrics                             Anesthesia Physical Anesthesia Plan  ASA: II  Anesthesia Plan: General   Post-op Pain Management:    Induction: Intravenous  PONV Risk Score and Plan: 4 or greater and Ondansetron, Dexamethasone, Midazolam, Scopolamine patch - Pre-op and Treatment may vary due to age or medical condition  Airway Management Planned: LMA  Additional Equipment:   Intra-op Plan:   Post-operative Plan: Extubation in OR  Informed Consent: I have reviewed the patients History and Physical, chart, labs and discussed the procedure including the risks, benefits and alternatives for the proposed anesthesia with the patient or authorized representative who has indicated his/her understanding and acceptance.     Plan Discussed with: CRNA and Surgeon  Anesthesia Plan Comments:         Anesthesia Quick Evaluation

## 2016-11-01 NOTE — Op Note (Signed)
Preoperative diagnosis: clinical stage II breast cancer, need for venous access Postoperative diagnosis: same as above Procedure: right ij US guided powerport insertion Surgeon: Dr Serita Grammes EBL: minimal Anes: general  Specimens none Complications none Drains none Sponge count correct Dispo to pacu stable  Indications: This is a 27 yof with her 2 positive node positive left breast cancer who will begin primary chemotherapy.  Discussed port placement.  Procedure: After informed consent was obtained the patient was taken to the operating room. She was given antibiotics. Sequential compression devices were on her legs. She was then placed under general anesthesia with an LMA. Then she was prepped and draped in the standard sterile surgical fashion. Surgical timeout was then performed.  I used the ultrasound to identify the right internal jugular vein. I then accessed the vein using the ultrasound.This aspirated blood. I then placed the wire. This was confirmed by fluoroscopy and ultrasound to be in the correct position. I created a pocket on the right chest. I tunneled the line between the 2 sites. I then dilated the tract and placed the dilator assembly with the sheath. This was done under fluoroscopy. I then removed the sheath and dilator. The wire was also removed. The line was then pulled back to be in the venacava. I hooked this up to the port. I sutured this into place with 2-0 Prolene in 2 places. This aspirated blood and flushed easily.This was confirmed with a final fluoroscopy. I then closed this with 2-0 Vicryl and 4-0 Monocryl. Dermabond was placed on both the incisions.A dressing was placed. She tolerated this well and was transferred to the recovery room in stable condition

## 2016-11-01 NOTE — Telephone Encounter (Signed)
Spoke with husband concerning appointment changes.Per 10/2 sch message

## 2016-11-01 NOTE — Anesthesia Postprocedure Evaluation (Signed)
Anesthesia Post Note  Patient: Laura Anthony  Procedure(s) Performed: INSERTION PORT-A-CATH WITH [place] (N/A Chest)     Patient location during evaluation: PACU Anesthesia Type: General Level of consciousness: awake and alert Pain management: pain level controlled Vital Signs Assessment: post-procedure vital signs reviewed and stable Respiratory status: spontaneous breathing, nonlabored ventilation, respiratory function stable and patient connected to nasal cannula oxygen Cardiovascular status: blood pressure returned to baseline and stable Postop Assessment: no apparent nausea or vomiting Anesthetic complications: no    Last Vitals:  Vitals:   11/01/16 1106 11/01/16 1115  BP: 119/80   Pulse: 69 74  Resp: 12 12  Temp: 36.4 C   SpO2: 100% 99%    Last Pain:  Vitals:   11/01/16 0723  TempSrc: Oral                 Muath Hallam DAVID

## 2016-11-01 NOTE — H&P (Signed)
Laura Anthony is an 41 y.o. female.   Chief Complaint: breast cancer HPI:  34 yof who presents in referral from Dr Laura Anthony with newly diagnosed left breast cancer. she noted palpable mass in the left breast and another noted at gyn visit. she underwent evaluation that showed multiple left breast masses. [place] has a 1.7 cm mass at 4 oclock, 11 mm mass at 6 and another 5 mm mass in the 630 position. she also has an axillary node that has a thickened cortex. biopsy of the breast lesions are er/pr negative and her2 positive with Ki 15-35%. she has no nipple dc. she has fh significant in her mother of passing away from tnbc.   Past Medical History:  Diagnosis Date  . ADD (attention deficit disorder)   . Anxiety   . 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 Cancer ([place])    left breast  . DVT (deep venous thrombosis) ([place]) 2017   calf left - probably due to [place] pills-  . Headache    migraine  . History of seizure    x 1 as a child - was never on anticonvulsants  . Impingement syndrome of right shoulder 07/2013  . PONV (postoperative nausea and vomiting)   . Right bicipital tenosynovitis 07/2013  . Rotator cuff impingement syndrome of left shoulder 07/12/2013  . Seizures ([place])     1 random as a child    Past Surgical History:  Procedure Laterality Date  . ADENOIDECTOMY    . ANKLE ARTHROSCOPY Right   . KNEE ARTHROSCOPY Right   . KNEE ARTHROSCOPY W/ ACL RECONSTRUCTION Left   . SEPTOPLASTY WITH ETHMOIDECTOMY, AND MAXILLARY ANTROSTOMY  10/29/2010   bilat. max. antrostomy with left max. stripping; left ant. ethmoidectomy; right total ethmoidectomy; sphenoidotomy  . SHOULDER ARTHROSCOPY WITH SUBACROMIAL DECOMPRESSION AND BICEP TENDON REPAIR Right 07/12/2013   Procedure: RIGHT SHOULDER ARTHROSCOPY DEBRIDEMENT EXTENSIVE DECOMPRESSION SUBACROMIAL PARTIAL ACROMIOPLASTY;  Surgeon: Johnny Bridge, MD;  Location: [place];  Service: Orthopedics;   Laterality: Right;  . WRIST ARTHROSCOPY  01/17/2012   Procedure: ARTHROSCOPY WRIST; right wrist Surgeon: Tennis Must, MD;  Location: [place];  Service: Orthopedics;  Laterality: Right;  RIGHT WRIST ARTHROSCOPY WITH TRIANGULAR FIBROCARTILAGE COMPLEX REPAIR AND DEBRIDEMENT     Family History  Problem Relation Age of Onset  . Breast cancer Mother    Social History:  reports that she has never smoked. She has never used smokeless tobacco. She reports that she drinks alcohol. She reports that she does not use drugs.  Allergies:  Allergies  Allergen Reactions  . Morphine And Related Shortness Of Breath and Itching    Medications Prior to Admission  Medication Sig Dispense Refill  . ALPRAZolam (XANAX) 0.5 MG tablet Take 0.5 mg by mouth at bedtime as needed for anxiety.    Marland Kitchen amphetamine-dextroamphetamine (ADDERALL) 20 MG tablet Take 20 mg by mouth daily as needed.    . busPIRone (BUSPAR) 5 MG tablet Take 5 mg by mouth daily as needed.    Marland Kitchen FLUoxetine (PROZAC) 20 MG capsule Take 20 mg by mouth every morning.    . lisdexamfetamine (VYVANSE) 40 MG capsule Take 40 mg by mouth every morning.     . loperamide (IMODIUM) 2 MG capsule Take 2 mg by mouth as needed for diarrhea or loose stools.    Marland Kitchen loratadine (CLARITIN) 10 MG tablet Take 10 mg by mouth daily as needed for allergies.    Marland Kitchen  Nutritional Supplements (JUICE PLUS FIBRE PO) Take 2 each by mouth daily. [employer]    . Nutritional Supplements (JUICE PLUS FIBRE PO) Take 2 each by mouth daily. [employer]    . Nutritional Supplements (JUICE PLUS FIBRE PO) Take 2 each by mouth daily. [employer]    . Nutritional Supplements (JUICE PLUS FIBRE PO) Take 2 each by mouth daily. [employer]    . rizatriptan (MAXALT) 10 MG tablet Take 10 mg by mouth as needed for migraine. May repeat in 2 hours if needed    . zolpidem (AMBIEN) 10 MG tablet Take 10 mg by mouth at bedtime as needed for sleep.    Marland Kitchen dexamethasone (DECADRON) 4 MG  tablet Take 1 tablet (4 mg total) by mouth daily. Date 1 tablet daily before chemotherapy and 1 tablet the day after 12 tablet 0  . lidocaine-prilocaine (EMLA) cream Apply to affected area once 30 g 3  . LORazepam (ATIVAN) 0.5 MG tablet Take 1 tablet (0.5 mg total) by mouth at bedtime. As needed for sleep 30 tablet 0  . ondansetron (ZOFRAN) 8 MG tablet Take 1 tablet (8 mg total) by mouth 2 (two) times daily as needed for refractory nausea / vomiting. Start on day 3 after chemo. 30 tablet 1  . prochlorperazine (COMPAZINE) 10 MG tablet Take 1 tablet (10 mg total) by mouth every 6 (six) hours as needed (Nausea or vomiting). 30 tablet 1    Results for orders placed or performed during the hospital encounter of 11/01/16 (from the past 48 hour(s))  CBC     Status: None   Collection Time: 11/01/16  7:24 AM  Result Value Ref Range   WBC 5.0 4.0 - 10.5 K/uL   RBC 4.26 3.87 - 5.11 MIL/uL   Hemoglobin 12.7 12.0 - 15.0 g/dL   HCT 37.6 36.0 - 46.0 %   MCV 88.3 78.0 - 100.0 fL   MCH 29.8 26.0 - 34.0 pg   MCHC 33.8 30.0 - 36.0 g/dL   RDW 12.3 11.5 - 15.5 %   Platelets 248 150 - 400 K/uL  hCG, serum, qualitative     Status: None   Collection Time: 11/01/16  7:24 AM  Result Value Ref Range   Preg, Serum NEGATIVE NEGATIVE    Comment:        THE SENSITIVITY OF THIS METHODOLOGY IS >10 mIU/mL.    No results found.  ROS Review of Systems Rolm Bookbinder MD; 10/31/2016 3:42 PM) Breast Present- Breast Mass. All other systems negative  Blood pressure 120/89, pulse 80, temperature 98.7 F (37.1 C), temperature source Oral, resp. rate 19, height _0  (1.702 m), weight 61.2 kg (135 lb), last menstrual period 10/27/2016, SpO2 99 %. Physical Exam  General Mental Status-Alert. Orientation-Oriented X3. Head and Neck Trachea-midline. Thyroid Gland Characteristics - normal size and consistency. Eye Sclera/Conjunctiva - Bilateral-No scleral icterus. Chest and Lung Exam Chest and lung exam  reveals -quiet, even and easy respiratory effort with no use of accessory muscles and on auscultation, normal breath sounds, no adventitious sounds and normal vocal resonance. Breast Nipples-No Discharge. Note: 2 small masses with hematoma in lower left breast Cardiovascular Cardiovascular examination reveals -normal heart sounds, regular rate and rhythm with no murmurs. Abdomen Note: no hepatomegaly Lymphatic Head & Neck General Head & Neck Lymphatics: Bilateral - Description - Normal. Axillary General Axillary Region: Bilateral - Description - Normal. 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

## 2016-11-02 ENCOUNTER — Encounter (HOSPITAL_COMMUNITY): Payer: Self-pay | Admitting: General Surgery

## 2016-11-03 ENCOUNTER — Other Ambulatory Visit: Payer: 59

## 2016-11-03 ENCOUNTER — Ambulatory Visit
Admission: RE | Admit: 2016-11-03 | Discharge: 2016-11-03 | Disposition: A | Discharge status: Home / Self Care | Payer: 59 | Source: Ambulatory Visit | Attending: Obstetrics and Gynecology | Admitting: Obstetrics and Gynecology

## 2016-11-03 ENCOUNTER — Ambulatory Visit (HOSPITAL_COMMUNITY)
Admission: RE | Admit: 2016-11-03 | Discharge: 2016-11-03 | Disposition: A | Discharge status: Home / Self Care | Payer: 59 | Source: Ambulatory Visit | Attending: Hematology and Oncology | Admitting: Hematology and Oncology

## 2016-11-03 ENCOUNTER — Encounter: Payer: Self-pay | Admitting: Hematology and Oncology

## 2016-11-03 ENCOUNTER — Encounter: Payer: Self-pay | Admitting: *Deleted

## 2016-11-03 DIAGNOSIS — Z853 Personal history of malignant neoplasm of breast: Secondary | ICD-10-CM | POA: Insufficient documentation

## 2016-11-03 DIAGNOSIS — C50512 Malignant neoplasm of lower-outer quadrant of left female breast: Secondary | ICD-10-CM | POA: Insufficient documentation

## 2016-11-03 DIAGNOSIS — Z171 Estrogen receptor negative status [ER-]: Secondary | ICD-10-CM | POA: Insufficient documentation

## 2016-11-03 DIAGNOSIS — C50919 Malignant neoplasm of unspecified site of unspecified female breast: Secondary | ICD-10-CM

## 2016-11-03 MED ORDER — GADOBENATE DIMEGLUMINE 529 MG/ML IV SOLN
12.0000 mL | Freq: Once | INTRAVENOUS | Status: AC | PRN
  Administered 2016-11-03: 12 mL via INTRAVENOUS

## 2016-11-03 NOTE — Progress Notes (Signed)
  Echocardiogram 2D Echocardiogram has been performed.  Laura Anthony 11/03/2016, 9:44 AM

## 2016-11-03 NOTE — Progress Notes (Signed)
Met with patient after chemo ed class to discuss available resources.  Asked patient if she has met her ded/OOp for insurance. She states she has met her deductible and almost OOP.  Advised patient that she may apply for copay assistance for Herceptin and Perjeta if she wishes to assist with copay cost even once her insurance renews. Patient states she is interested. Applied with patient present through [place]. Patient approved. Advised her that she will receive an approval letter in the mail for her records and there is nothing she needs to do. I will monitor her billing and submit to the billing department to send to [facility] if she is charged a copay and it will only leave her with a balance of $5. Patient was appreciative and verbalized understanding.  Discussed Advertising account executive and income guidelines for a household of 5. Patient states they are over the income of the FP guidelines I mentioned. Advised patient if the income changes at any time during her treamtment, she may apply for assistance at that time. Patient verbalized understanding. I gave her my card for any additional financial questions or concerns.

## 2016-11-03 NOTE — Progress Notes (Signed)
Received approval letters from [place] for [place] via fax.  Patient approved for $25,000 for Herceptin 11/03/16-11/02/17 only leaving a $5 copay after insurance as long as she remains commercially insured.  Patient approved for $25,000 for Perjeta 11/03/16-11/02/17 only leaving a $5 copay after insurance as long as she remains commercially insured.

## 2016-11-04 ENCOUNTER — Other Ambulatory Visit: Payer: Self-pay

## 2016-11-04 ENCOUNTER — Ambulatory Visit (HOSPITAL_BASED_OUTPATIENT_CLINIC_OR_DEPARTMENT_OTHER): Payer: 59

## 2016-11-04 ENCOUNTER — Ambulatory Visit: Payer: 59

## 2016-11-04 ENCOUNTER — Ambulatory Visit (HOSPITAL_BASED_OUTPATIENT_CLINIC_OR_DEPARTMENT_OTHER): Payer: 59 | Admitting: Hematology and Oncology

## 2016-11-04 ENCOUNTER — Other Ambulatory Visit: Payer: Self-pay | Admitting: Hematology and Oncology

## 2016-11-04 ENCOUNTER — Other Ambulatory Visit (HOSPITAL_BASED_OUTPATIENT_CLINIC_OR_DEPARTMENT_OTHER): Payer: 59

## 2016-11-04 ENCOUNTER — Encounter: Payer: Self-pay | Admitting: *Deleted

## 2016-11-04 VITALS — BP 112/73 | HR 86 | Temp 99.3°F | Resp 17

## 2016-11-04 DIAGNOSIS — Z5111 Encounter for antineoplastic chemotherapy: Secondary | ICD-10-CM | POA: Diagnosis not present

## 2016-11-04 DIAGNOSIS — C773 Secondary and unspecified malignant neoplasm of axilla and upper limb lymph nodes: Secondary | ICD-10-CM | POA: Diagnosis not present

## 2016-11-04 DIAGNOSIS — Z171 Estrogen receptor negative status [ER-]: Secondary | ICD-10-CM

## 2016-11-04 DIAGNOSIS — C50512 Malignant neoplasm of lower-outer quadrant of left female breast: Secondary | ICD-10-CM

## 2016-11-04 DIAGNOSIS — Z5112 Encounter for antineoplastic immunotherapy: Secondary | ICD-10-CM

## 2016-11-04 DIAGNOSIS — Z5189 Encounter for other specified aftercare: Secondary | ICD-10-CM | POA: Diagnosis not present

## 2016-11-04 LAB — CBC WITH DIFFERENTIAL/PLATELET
BASO%: 0.6 % (ref 0.0–2.0)
Basophils Absolute: 0.1 10*3/uL (ref 0.0–0.1)
EOS%: 0.2 % (ref 0.0–7.0)
Eosinophils Absolute: 0 10*3/uL (ref 0.0–0.5)
HCT: 37.1 % (ref 34.8–46.6)
HGB: 12.6 g/dL (ref 11.6–15.9)
LYMPH%: 19.9 % (ref 14.0–49.7)
MCH: 31 pg (ref 25.1–34.0)
MCHC: 33.9 g/dL (ref 31.5–36.0)
MCV: 91.5 fL (ref 79.5–101.0)
MONO#: 0.5 10*3/uL (ref 0.1–0.9)
MONO%: 4.7 % (ref 0.0–14.0)
NEUT#: 8 10*3/uL — ABNORMAL HIGH (ref 1.5–6.5)
NEUT%: 74.6 % (ref 38.4–76.8)
Platelets: 235 10*3/uL (ref 145–400)
RBC: 4.05 10*6/uL (ref 3.70–5.45)
RDW: 12.6 % (ref 11.2–14.5)
WBC: 10.7 10*3/uL — ABNORMAL HIGH (ref 3.9–10.3)
lymph#: 2.1 10*3/uL (ref 0.9–3.3)

## 2016-11-04 LAB — COMPREHENSIVE METABOLIC PANEL
ALT: <6 U/L (ref 0–55)
AST: 11 U/L (ref 5–34)
Albumin: 3.7 g/dL (ref 3.5–5.0)
Alkaline Phosphatase: 62 U/L (ref 40–150)
Anion Gap: 8 mEq/L (ref 3–11)
BUN: 12.2 mg/dL (ref 7.0–26.0)
CO2: 26 mEq/L (ref 22–29)
Calcium: 9.1 mg/dL (ref 8.4–10.4)
Chloride: 107 mEq/L (ref 98–109)
Creatinine: 0.8 mg/dL (ref 0.6–1.1)
EGFR: >90 mL/min/{1.73_m2} (ref >90)
Glucose: 127 mg/dl (ref 70–140)
Potassium: 3 mEq/L — CL (ref 3.5–5.1)
Sodium: 141 mEq/L (ref 136–145)
Total Bilirubin: 0.35 mg/dL (ref 0.20–1.20)
Total Protein: 6.8 g/dL (ref 6.4–8.3)

## 2016-11-04 MED ORDER — ACETAMINOPHEN 325 MG PO TABS
ORAL_TABLET | ORAL | Status: AC
  Filled 2016-11-04: qty 2

## 2016-11-04 MED ORDER — SODIUM CHLORIDE 0.9 % IV SOLN
840.0000 mg | Freq: Once | INTRAVENOUS | Status: AC
  Administered 2016-11-04: 840 mg via INTRAVENOUS
  Filled 2016-11-04: qty 28

## 2016-11-04 MED ORDER — SODIUM CHLORIDE 0.9% FLUSH
10.0000 mL | INTRAVENOUS | Status: DC | PRN
  Administered 2016-11-04: 10 mL
  Filled 2016-11-04: qty 10

## 2016-11-04 MED ORDER — DIPHENHYDRAMINE HCL 25 MG PO CAPS
50.0000 mg | ORAL_CAPSULE | Freq: Once | ORAL | Status: AC
  Administered 2016-11-04: 50 mg via ORAL

## 2016-11-04 MED ORDER — TRASTUZUMAB CHEMO 150 MG IV SOLR
8.0000 mg/kg | Freq: Once | INTRAVENOUS | Status: AC
  Administered 2016-11-04: 483 mg via INTRAVENOUS
  Filled 2016-11-04: qty 23

## 2016-11-04 MED ORDER — HEPARIN SOD (PORK) LOCK FLUSH 100 UNIT/ML IV SOLN
500.0000 [IU] | Freq: Once | INTRAVENOUS | Status: AC | PRN
  Administered 2016-11-04: 500 [IU]
  Filled 2016-11-04: qty 5

## 2016-11-04 MED ORDER — DIPHENHYDRAMINE HCL 25 MG PO CAPS
ORAL_CAPSULE | ORAL | Status: AC
  Filled 2016-11-04: qty 2

## 2016-11-04 MED ORDER — POTASSIUM CHLORIDE CRYS ER 20 MEQ PO TBCR: 20.0000 meq | EXTENDED_RELEASE_TABLET | Freq: Every day | ORAL | 1 refills | Status: DC

## 2016-11-04 MED ORDER — PEGFILGRASTIM 6 MG/0.6ML ~~LOC~~ PSKT
6.0000 mg | PREFILLED_SYRINGE | Freq: Once | SUBCUTANEOUS | Status: AC
  Administered 2016-11-04: 6 mg via SUBCUTANEOUS

## 2016-11-04 MED ORDER — SODIUM CHLORIDE 0.9 % IV SOLN
Freq: Once | INTRAVENOUS | Status: AC
  Administered 2016-11-04: 10:00:00 via INTRAVENOUS

## 2016-11-04 MED ORDER — PALONOSETRON HCL INJECTION 0.25 MG/5ML
0.2500 mg | Freq: Once | INTRAVENOUS | Status: AC
  Administered 2016-11-04: 0.25 mg via INTRAVENOUS

## 2016-11-04 MED ORDER — DOCETAXEL CHEMO INJECTION 160 MG/16ML
75.0000 mg/m2 | Freq: Once | INTRAVENOUS | Status: AC
  Administered 2016-11-04: 130 mg via INTRAVENOUS
  Filled 2016-11-04: qty 13

## 2016-11-04 MED ORDER — ACETAMINOPHEN 325 MG PO TABS
650.0000 mg | ORAL_TABLET | Freq: Once | ORAL | Status: AC
  Administered 2016-11-04: 650 mg via ORAL

## 2016-11-04 MED ORDER — PALONOSETRON HCL INJECTION 0.25 MG/5ML
INTRAVENOUS | Status: AC
  Filled 2016-11-04: qty 5

## 2016-11-04 MED ORDER — DEXAMETHASONE SODIUM PHOSPHATE 10 MG/ML IJ SOLN
10.0000 mg | Freq: Once | INTRAMUSCULAR | Status: AC
  Administered 2016-11-04: 10 mg via INTRAVENOUS

## 2016-11-04 MED ORDER — DEXAMETHASONE SODIUM PHOSPHATE 10 MG/ML IJ SOLN
INTRAMUSCULAR | Status: AC
  Filled 2016-11-04: qty 1

## 2016-11-04 MED ORDER — SODIUM CHLORIDE 0.9 % IV SOLN
700.0000 mg | Freq: Once | INTRAVENOUS | Status: AC
  Administered 2016-11-04: 700 mg via INTRAVENOUS
  Filled 2016-11-04: qty 70

## 2016-11-04 NOTE — Progress Notes (Signed)
Patient Care Team: [name], MD as PCP - General (Family Medicine)  DIAGNOSIS:  Encounter Diagnosis  Name Primary?  . Malignant neoplasm of lower-outer quadrant of left breast of female, estrogen receptor negative ([place])     SUMMARY OF ONCOLOGIC HISTORY:   Malignant neoplasm of lower-outer quadrant of left breast of female, estrogen receptor negative ([place])   10/20/2016 Mammogram    Mammogram and ultrasound of the left breast revealed 1.7 cm mass at 4:00 position, 6:30 position 5 x 4 x 4 mm mass, 6:00 position 5 cm nipple 7 x 6 x 11 mm, left axillary lymph node with thickened cortex, T1c N1 stage II a AJCC 8      10/24/2016 Initial Diagnosis    Left breast biopsy 6:30 position 3 cm from nipple: IDC grade 2, DCIS, ER 0%, PR 0%, Ki-67 15% HER-2 positive ratio 2.1; 4:00 position 3 cm from nipple: IDC grade 2, DCIS, ER 0%, PR 0%, Ki-67 35%, HER-2 positive ratio 2.02; left axillary lymph node biopsy positive      11/04/2016 -  Neo-Adjuvant Chemotherapy    TCH Perjeta 6 cycles followed by Herceptin +/- Perjeta maintenance       CHIEF COMPLIANT: Cycle 1 day 1 neoadjuvant chemotherapy with TCH Perjeta  INTERVAL HISTORY: Laura Anthony is a 41 year old with above-mentioned history left breast cancer who is here today to start neoadjuvant chemotherapy with [place]. She had a left axillary lymph node biopsy which came back positive. She is anxious to start treatment. Her breast MRI showed additional nodules in the left breast. Because she will get mastectomy we would not pursue those at this time. The right breast did not have any lumps or nodules.  REVIEW OF SYSTEMS:   Constitutional: Denies fevers, chills or abnormal weight loss Eyes: Denies blurriness of vision Ears, nose, mouth, throat, and face: Denies mucositis or sore throat Respiratory: Denies cough, dyspnea or wheezes Cardiovascular: Denies palpitation, chest discomfort Gastrointestinal:  Denies nausea, heartburn or  change in bowel habits Skin: Denies abnormal skin rashes Lymphatics: Denies new lymphadenopathy or easy bruising Neurological:Denies numbness, tingling or new weaknesses Behavioral/Psych: Anxious to start treatment but Ativan has helped her. Extremities: No lower extremity edema Breast:  denies any pain or lumps or nodules in either breasts All other systems were reviewed with the patient and are negative.  I have reviewed the past medical history, past surgical history, social history and family history with the patient and they are unchanged from previous note.  ALLERGIES:  is allergic to morphine and related.  MEDICATIONS:  Current Outpatient Prescriptions  Medication Sig Dispense Refill  . ALPRAZolam (XANAX) 0.5 MG tablet Take 0.5 mg by mouth at bedtime as needed for anxiety.    Marland Kitchen amphetamine-dextroamphetamine (ADDERALL) 20 MG tablet Take 20 mg by mouth daily as needed.    . busPIRone (BUSPAR) 5 MG tablet Take 5 mg by mouth daily as needed.    Marland Kitchen dexamethasone (DECADRON) 4 MG tablet Take 1 tablet (4 mg total) by mouth daily. Date 1 tablet daily before chemotherapy and 1 tablet the day after 12 tablet 0  . FLUoxetine (PROZAC) 20 MG capsule Take 20 mg by mouth every morning.    . lidocaine-prilocaine (EMLA) cream Apply to affected area once 30 g 3  . lisdexamfetamine (VYVANSE) 40 MG capsule Take 40 mg by mouth every morning.     . loperamide (IMODIUM) 2 MG capsule Take 2 mg by mouth as needed for diarrhea or loose stools.    Marland Kitchen  loratadine (CLARITIN) 10 MG tablet Take 10 mg by mouth daily as needed for allergies.    Marland Kitchen LORazepam (ATIVAN) 0.5 MG tablet Take 1 tablet (0.5 mg total) by mouth at bedtime. As needed for sleep 30 tablet 0  . Nutritional Supplements (JUICE PLUS FIBRE PO) Take 2 each by mouth daily. [employer]    . Nutritional Supplements (JUICE PLUS FIBRE PO) Take 2 each by mouth daily. [employer]    . Nutritional Supplements (JUICE PLUS FIBRE PO) Take 2 each by mouth daily.  [employer]    . Nutritional Supplements (JUICE PLUS FIBRE PO) Take 2 each by mouth daily. [employer]    . ondansetron (ZOFRAN) 8 MG tablet Take 1 tablet (8 mg total) by mouth 2 (two) times daily as needed for refractory nausea / vomiting. Start on day 3 after chemo. 30 tablet 1  . prochlorperazine (COMPAZINE) 10 MG tablet Take 1 tablet (10 mg total) by mouth every 6 (six) hours as needed (Nausea or vomiting). 30 tablet 1  . rizatriptan (MAXALT) 10 MG tablet Take 10 mg by mouth as needed for migraine. May repeat in 2 hours if needed    . zolpidem (AMBIEN) 10 MG tablet Take 10 mg by mouth at bedtime as needed for sleep.     No current facility-administered medications for this visit.     PHYSICAL EXAMINATION: ECOG PERFORMANCE STATUS: 1 - Symptomatic but completely ambulatory  Vitals:   11/04/16 0921  BP: 128/80  Pulse: 76  Resp: 19  Temp: 98.4 F (36.9 C)  SpO2: 100%   Filed Weights   11/04/16 0921  Weight: 139 lb (63 kg)    GENERAL:alert, no distress and comfortable SKIN: skin color, texture, turgor are normal, no rashes or significant lesions EYES: normal, Conjunctiva are pink and non-injected, sclera clear OROPHARYNX:no exudate, no erythema and lips, buccal mucosa, and tongue normal  NECK: supple, thyroid normal size, non-tender, without nodularity LYMPH:  no palpable lymphadenopathy in the cervical, axillary or inguinal LUNGS: clear to auscultation and percussion with normal breathing effort HEART: regular rate & rhythm and no murmurs and no lower extremity edema ABDOMEN:abdomen soft, non-tender and normal bowel sounds MUSCULOSKELETAL:no cyanosis of digits and no clubbing  NEURO: alert & oriented x 3 with fluent speech, no focal motor/sensory deficits EXTREMITIES: No lower extremity edema BREAST: No palpable masses or nodules in either right or left breasts. No palpable axillary supraclavicular or infraclavicular adenopathy no breast tenderness or nipple discharge. (exam  performed in the presence of a chaperone)  LABORATORY DATA:  I have reviewed the data as listed   Chemistry      Component Value Date/Time   NA 139 10/19/2010 2105   K 3.5 10/19/2010 2105   CL 103 10/19/2010 2105   BUN 14 10/19/2010 2105   CREATININE 0.90 10/19/2010 2105   No results found for: CALCIUM, ALKPHOS, AST, ALT, BILITOT     Lab Results  Component Value Date   WBC 10.7 (H) 11/04/2016   HGB 12.6 11/04/2016   HCT 37.1 11/04/2016   MCV 91.5 11/04/2016   PLT 235 11/04/2016   NEUTROABS 8.0 (H) 11/04/2016    ASSESSMENT & PLAN:  Malignant neoplasm of lower-outer quadrant of left breast of female, estrogen receptor negative ([place]) Mammogram and ultrasound of the left breast revealed 1.7 cm mass at 4:00 position, 6:30 position 5 x 4 x 4 mm mass, 6:00 position 5 cm nipple 7 x 6 x 11 mm, left axillary lymph node with thickened cortex,  T1c N1 stage II a AJCC 8  10/24/2016: Left breast biopsy 6:30 position 3 cm from nipple: IDC grade 2, DCIS, ER 0%, PR 0%, Ki-67 15% HER-2 positive ratio 2.1; 4:00 position 3 cm from nipple: IDC grade 2, DCIS, ER 0%, PR 0%, Ki-67 35%, HER-2 positive ratio 2.02 Lymph node biopsy positive  Treatment plan: Genetic counseling 1. Neoadjuvant chemotherapy with TCHP This would be followed by Herceptin maintenance for 1 year. 2. followed by breast conserving surgery if it is feasible 3. Followed by adjuvant radiation ---------------------------------------------------------------------------- Current treatment: Cycle 1 day 1 TCH Perjeta Antiemetics were reviewed Labs were reviewed Consent obtained Echocardiogram 11/03/2016: EF 55-60% Follow-up in one week for toxicity check   I spent 25 minutes talking to the patient of which more than half was spent in counseling and coordination of care.  No orders of the defined types were placed in this encounter.  The patient has a good understanding of the overall plan. she agrees with it. she will call with  any problems that may develop before the next visit here.   Rulon Eisenmenger, MD 11/04/16

## 2016-11-04 NOTE — Patient Instructions (Signed)
Implanted Port Home Guide An implanted port is a type of central line that is placed under the skin. Central lines are used to provide IV access when treatment or nutrition needs to be given through a person's veins. Implanted ports are used for long-term IV access. An implanted port may be placed because:  You need IV medicine that would be irritating to the small veins in your hands or arms.  You need long-term IV medicines, such as antibiotics.  You need IV nutrition for a long period.  You need frequent blood draws for lab tests.  You need dialysis.  Implanted ports are usually placed in the chest area, but they can also be placed in the upper arm, the abdomen, or the leg. An implanted port has two main parts:  Reservoir. The reservoir is round and will appear as a small, raised area under your skin. The reservoir is the part where a needle is inserted to give medicines or draw blood.  Catheter. The catheter is a thin, flexible tube that extends from the reservoir. The catheter is placed into a large vein. Medicine that is inserted into the reservoir goes into the catheter and then into the vein.  How will I care for my incision site? Do not get the incision site wet. Bathe or shower as directed by your health care provider. How is my port accessed? Special steps must be taken to access the port:  Before the port is accessed, a numbing cream can be placed on the skin. This helps numb the skin over the port site.  Your health care provider uses a sterile technique to access the port. ? Your health care provider must put on a mask and sterile gloves. ? The skin over your port is cleaned carefully with an antiseptic and allowed to dry. ? The port is gently pinched between sterile gloves, and a needle is inserted into the port.  Only "non-coring" port needles should be used to access the port. Once the port is accessed, a blood return should be checked. This helps ensure that the port  is in the vein and is not clogged.  If your port needs to remain accessed for a constant infusion, a clear (transparent) bandage will be placed over the needle site. The bandage and needle will need to be changed every week, or as directed by your health care provider.  Keep the bandage covering the needle clean and dry. Do not get it wet. Follow your health care provider's instructions on how to take a shower or bath while the port is accessed.  If your port does not need to stay accessed, no bandage is needed over the port.  What is flushing? Flushing helps keep the port from getting clogged. Follow your health care provider's instructions on how and when to flush the port. Ports are usually flushed with saline solution or a medicine called heparin. The need for flushing will depend on how the port is used.  If the port is used for intermittent medicines or blood draws, the port will need to be flushed: ? After medicines have been given. ? After blood has been drawn. ? As part of routine maintenance.  If a constant infusion is running, the port may not need to be flushed.  How long will my port stay implanted? The port can stay in for as long as your health care provider thinks it is needed. When it is time for the port to come out, surgery will be   done to remove it. The procedure is similar to the one performed when the port was put in. When should I seek immediate medical care? When you have an implanted port, you should seek immediate medical care if:  You notice a bad smell coming from the incision site.  You have swelling, redness, or drainage at the incision site.  You have more swelling or pain at the port site or the surrounding area.  You have a fever that is not controlled with medicine.  This information is not intended to replace advice given to you by your health care provider. Make sure you discuss any questions you have with your health care provider. Document  Released: 01/17/2005 Document Revised: 06/25/2015 Document Reviewed: 09/24/2012 Elsevier Interactive Patient Education  2017 [employer]  

## 2016-11-04 NOTE — Progress Notes (Signed)
May desk RN for Dr Lindi Adie aware of K 3.0 today. May is notifying Dr Lindi Adie. RX for K phoned into pharmacy. Notified pt, pt verbalized understanding.

## 2016-11-04 NOTE — Assessment & Plan Note (Signed)
Mammogram and ultrasound of the left breast revealed 1.7 cm mass at 4:00 position, 6:30 position 5 x 4 x 4 mm mass, 6:00 position 5 cm nipple 7 x 6 x 11 mm, left axillary lymph node with thickened cortex, T1c N1 stage II a AJCC 8  10/24/2016: Left breast biopsy 6:30 position 3 cm from nipple: IDC grade 2, DCIS, ER 0%, PR 0%, Ki-67 15% HER-2 positive ratio 2.1; 4:00 position 3 cm from nipple: IDC grade 2, DCIS, ER 0%, PR 0%, Ki-67 35%, HER-2 positive ratio 2.02 Lymph node biopsy positive  Treatment plan: Genetic counseling 1. Neoadjuvant chemotherapy with TCHP This would be followed by Herceptin maintenance for 1 year. 2. followed by breast conserving surgery if it is feasible 3. Followed by adjuvant radiation ---------------------------------------------------------------------------- Current treatment: Cycle 1 day 1 TCH Perjeta Antiemetics were reviewed Labs were reviewed Consent obtained Echocardiogram 11/03/2016: EF 55-60% Follow-up in one week for toxicity check

## 2016-11-04 NOTE — Patient Instructions (Signed)
[facility] Discharge Instructions for Patients Receiving Chemotherapy  Today you received the following chemotherapy agents Herceptin, Perjeta, Taxotere, Carboplatin  To help prevent nausea and vomiting after your treatment, we encourage you to take your nausea medication as prescribed. If you develop nausea and vomiting that is not controlled by your nausea medication, call the clinic.   BELOW ARE SYMPTOMS THAT SHOULD BE REPORTED IMMEDIATELY:  *FEVER GREATER THAN 100.5 F  *CHILLS WITH OR WITHOUT FEVER  NAUSEA AND VOMITING THAT IS NOT CONTROLLED WITH YOUR NAUSEA MEDICATION  *UNUSUAL SHORTNESS OF BREATH  *UNUSUAL BRUISING OR BLEEDING  TENDERNESS IN MOUTH AND THROAT WITH OR WITHOUT PRESENCE OF ULCERS  *URINARY PROBLEMS  *BOWEL PROBLEMS  UNUSUAL RASH Items with * indicate a potential emergency and should be followed up as soon as possible.  Feel free to call the clinic should you have any questions or concerns. The clinic phone number is [phone].  Please show the [place] at check-in to the Emergency Department and triage nurse.  Trastuzumab injection for infusion (Herceptin) What is this medicine? TRASTUZUMAB (tras TOO zoo mab) is a monoclonal antibody. It is used to treat breast cancer and stomach cancer. This medicine may be used for other purposes; ask your health care provider or pharmacist if you have questions. COMMON BRAND NAME(S): Herceptin What should I tell my health care provider before I take this medicine? They need to know if you have any of these conditions: -heart disease -heart failure -lung or breathing disease, like asthma -an unusual or allergic reaction to trastuzumab, benzyl alcohol, or other medications, foods, dyes, or preservatives -pregnant or trying to get pregnant -breast-feeding How should I use this medicine? This drug is given as an infusion into a vein. It is administered in a hospital or clinic by a specially  trained health care professional. Talk to your pediatrician regarding the use of this medicine in children. This medicine is not approved for use in children. Overdosage: If you think you have taken too much of this medicine contact a poison control center or emergency room at once. NOTE: This medicine is only for you. Do not share this medicine with others. What if I miss a dose? It is important not to miss a dose. Call your doctor or health care professional if you are unable to keep an appointment. What may interact with this medicine? This medicine may interact with the following medications: -certain types of chemotherapy, such as daunorubicin, doxorubicin, epirubicin, and idarubicin This list may not describe all possible interactions. Give your health care provider a list of all the medicines, herbs, non-prescription drugs, or dietary supplements you use. Also tell them if you smoke, drink alcohol, or use illegal drugs. Some items may interact with your medicine. What should I watch for while using this medicine? Visit your doctor for checks on your progress. Report any side effects. Continue your course of treatment even though you feel ill unless your doctor tells you to stop. Call your doctor or health care professional for advice if you get a fever, chills or sore throat, or other symptoms of a cold or flu. Do not treat yourself. Try to avoid being around people who are sick. You may experience fever, chills and shaking during your first infusion. These effects are usually mild and can be treated with other medicines. Report any side effects during the infusion to your health care professional. Fever and chills usually do not happen with later infusions. Do not become  pregnant while taking this medicine or for 7 months after stopping it. Women should inform their doctor if they wish to become pregnant or think they might be pregnant. Women of child-bearing potential will need to have a  negative pregnancy test before starting this medicine. There is a potential for serious side effects to an unborn child. Talk to your health care professional or pharmacist for more information. Do not breast-feed an infant while taking this medicine or for 7 months after stopping it. Women must use effective birth control with this medicine. What side effects may I notice from receiving this medicine? Side effects that you should report to your doctor or health care professional as soon as possible: -allergic reactions like skin rash, itching or hives, swelling of the face, lips, or tongue -chest pain or palpitations -cough -dizziness -feeling faint or lightheaded, falls -fever -general ill feeling or flu-like symptoms -signs of worsening heart failure like breathing problems; swelling in your legs and feet -unusually weak or tired Side effects that usually do not require medical attention (report to your doctor or health care professional if they continue or are bothersome): -bone pain -changes in taste -diarrhea -joint pain -nausea/vomiting -weight loss This list may not describe all possible side effects. Call your doctor for medical advice about side effects. You may report side effects to FDA at 1-800-FDA-1088. Where should I keep my medicine? This drug is given in a hospital or clinic and will not be stored at home. NOTE: This sheet is a summary. It may not cover all possible information. If you have questions about this medicine, talk to your doctor, pharmacist, or health care provider.  2018 Elsevier/Gold Standard (2016-01-12 14:37:52)  Pertuzumab injection (Perjeta) What is this medicine? PERTUZUMAB (per TOOZ ue mab) is a monoclonal antibody. It is used to treat breast cancer. This medicine may be used for other purposes; ask your health care provider or pharmacist if you have questions. COMMON BRAND NAME(S): PERJETA What should I tell my health care provider before I take  this medicine? They need to know if you have any of these conditions: -heart disease -heart failure -high blood pressure -history of irregular heart beat -recent or ongoing radiation therapy -an unusual or allergic reaction to pertuzumab, other medicines, foods, dyes, or preservatives -pregnant or trying to get pregnant -breast-feeding How should I use this medicine? This medicine is for infusion into a vein. It is given by a health care professional in a hospital or clinic setting. Talk to your pediatrician regarding the use of this medicine in children. Special care may be needed. Overdosage: If you think you have taken too much of this medicine contact a poison control center or emergency room at once. NOTE: This medicine is only for you. Do not share this medicine with others. What if I miss a dose? It is important not to miss your dose. Call your doctor or health care professional if you are unable to keep an appointment. What may interact with this medicine? Interactions are not expected. Give your health care provider a list of all the medicines, herbs, non-prescription drugs, or dietary supplements you use. Also tell them if you smoke, drink alcohol, or use illegal drugs. Some items may interact with your medicine. This list may not describe all possible interactions. Give your health care provider a list of all the medicines, herbs, non-prescription drugs, or dietary supplements you use. Also tell them if you smoke, drink alcohol, or use illegal drugs. Some items may interact  with your medicine. What should I watch for while using this medicine? Your condition will be monitored carefully while you are receiving this medicine. Report any side effects. Continue your course of treatment even though you feel ill unless your doctor tells you to stop. Do not become pregnant while taking this medicine or for 7 months after stopping it. Women should inform their doctor if they wish to become  pregnant or think they might be pregnant. Women of child-bearing potential will need to have a negative pregnancy test before starting this medicine. There is a potential for serious side effects to an unborn child. Talk to your health care professional or pharmacist for more information. Do not breast-feed an infant while taking this medicine or for 7 months after stopping it. Women must use effective birth control with this medicine. Call your doctor or health care professional for advice if you get a fever, chills or sore throat, or other symptoms of a cold or flu. Do not treat yourself. Try to avoid being around people who are sick. You may experience fever, chills, and headache during the infusion. Report any side effects during the infusion to your health care professional. What side effects may I notice from receiving this medicine? Side effects that you should report to your doctor or health care professional as soon as possible: -breathing problems -chest pain or palpitations -dizziness -feeling faint or lightheaded -fever or chills -skin rash, itching or hives -sore throat -swelling of the face, lips, or tongue -swelling of the legs or ankles -unusually weak or tired Side effects that usually do not require medical attention (report to your doctor or health care professional if they continue or are bothersome): -diarrhea -hair loss -nausea, vomiting -tiredness This list may not describe all possible side effects. Call your doctor for medical advice about side effects. You may report side effects to FDA at 1-800-FDA-1088. Where should I keep my medicine? This drug is given in a hospital or clinic and will not be stored at home. NOTE: This sheet is a summary. It may not cover all possible information. If you have questions about this medicine, talk to your doctor, pharmacist, or health care provider.  2018 Elsevier/Gold Standard (2015-02-19 12:08:50)  Docetaxel injection  (Taxotere) What is this medicine? DOCETAXEL (doe se TAX el) is a chemotherapy drug. It targets fast dividing cells, like cancer cells, and causes these cells to die. This medicine is used to treat many types of cancers like breast cancer, certain stomach cancers, head and neck cancer, lung cancer, and prostate cancer. This medicine may be used for other purposes; ask your health care provider or pharmacist if you have questions. COMMON BRAND NAME(S): Docefrez, Taxotere What should I tell my health care provider before I take this medicine? They need to know if you have any of these conditions: -infection (especially a virus infection such as chickenpox, cold sores, or herpes) -liver disease -low blood counts, like low white cell, platelet, or red cell counts -an unusual or allergic reaction to docetaxel, polysorbate 80, other chemotherapy agents, other medicines, foods, dyes, or preservatives -pregnant or trying to get pregnant -breast-feeding How should I use this medicine? This drug is given as an infusion into a vein. It is administered in a hospital or clinic by a specially trained health care professional. Talk to your pediatrician regarding the use of this medicine in children. Special care may be needed. Overdosage: If you think you have taken too much of this medicine contact a poison  control center or emergency room at once. NOTE: This medicine is only for you. Do not share this medicine with others. What if I miss a dose? It is important not to miss your dose. Call your doctor or health care professional if you are unable to keep an appointment. What may interact with this medicine? -cyclosporine -erythromycin -ketoconazole -medicines to increase blood counts like filgrastim, pegfilgrastim, sargramostim -vaccines Talk to your doctor or health care professional before taking any of these medicines: -acetaminophen -aspirin -ibuprofen -ketoprofen -naproxen This list may not  describe all possible interactions. Give your health care provider a list of all the medicines, herbs, non-prescription drugs, or dietary supplements you use. Also tell them if you smoke, drink alcohol, or use illegal drugs. Some items may interact with your medicine. What should I watch for while using this medicine? Your condition will be monitored carefully while you are receiving this medicine. You will need important blood work done while you are taking this medicine. This drug may make you feel generally unwell. This is not uncommon, as chemotherapy can affect healthy cells as well as cancer cells. Report any side effects. Continue your course of treatment even though you feel ill unless your doctor tells you to stop. In some cases, you may be given additional medicines to help with side effects. Follow all directions for their use. Call your doctor or health care professional for advice if you get a fever, chills or sore throat, or other symptoms of a cold or flu. Do not treat yourself. This drug decreases your body's ability to fight infections. Try to avoid being around people who are sick. This medicine may increase your risk to bruise or bleed. Call your doctor or health care professional if you notice any unusual bleeding. This medicine may contain alcohol in the product. You may get drowsy or dizzy. Do not drive, use machinery, or do anything that needs mental alertness until you know how this medicine affects you. Do not stand or sit up quickly, especially if you are an older patient. This reduces the risk of dizzy or fainting spells. Avoid alcoholic drinks. Do not become pregnant while taking this medicine. Women should inform their doctor if they wish to become pregnant or think they might be pregnant. There is a potential for serious side effects to an unborn child. Talk to your health care professional or pharmacist for more information. Do not breast-feed an infant while taking this  medicine. What side effects may I notice from receiving this medicine? Side effects that you should report to your doctor or health care professional as soon as possible: -allergic reactions like skin rash, itching or hives, swelling of the face, lips, or tongue -low blood counts - This drug may decrease the number of white blood cells, red blood cells and platelets. You may be at increased risk for infections and bleeding. -signs of infection - fever or chills, cough, sore throat, pain or difficulty passing urine -signs of decreased platelets or bleeding - bruising, pinpoint red spots on the skin, black, tarry stools, nosebleeds -signs of decreased red blood cells - unusually weak or tired, fainting spells, lightheadedness -breathing problems -fast or irregular heartbeat -low blood pressure -mouth sores -nausea and vomiting -pain, swelling, redness or irritation at the injection site -pain, tingling, numbness in the hands or feet -swelling of the ankle, feet, hands -weight gain Side effects that usually do not require medical attention (report to your doctor or health care professional if they continue or  are bothersome): -bone pain -complete hair loss including hair on your head, underarms, pubic hair, eyebrows, and eyelashes -diarrhea -excessive tearing -changes in the color of fingernails -loosening of the fingernails -nausea -muscle pain -red flush to skin -sweating -weak or tired This list may not describe all possible side effects. Call your doctor for medical advice about side effects. You may report side effects to FDA at 1-800-FDA-1088. Where should I keep my medicine? This drug is given in a hospital or clinic and will not be stored at home. NOTE: This sheet is a summary. It may not cover all possible information. If you have questions about this medicine, talk to your doctor, pharmacist, or health care provider.  2018 Elsevier/Gold Standard (2015-02-19  12:32:56)   Carboplatin injection What is this medicine? CARBOPLATIN (KAR boe pla tin) is a chemotherapy drug. It targets fast dividing cells, like cancer cells, and causes these cells to die. This medicine is used to treat ovarian cancer and many other cancers. This medicine may be used for other purposes; ask your health care provider or pharmacist if you have questions. COMMON BRAND NAME(S): Paraplatin What should I tell my health care provider before I take this medicine? They need to know if you have any of these conditions: -blood disorders -hearing problems -kidney disease -recent or ongoing radiation therapy -an unusual or allergic reaction to carboplatin, cisplatin, other chemotherapy, other medicines, foods, dyes, or preservatives -pregnant or trying to get pregnant -breast-feeding How should I use this medicine? This drug is usually given as an infusion into a vein. It is administered in a hospital or clinic by a specially trained health care professional. Talk to your pediatrician regarding the use of this medicine in children. Special care may be needed. Overdosage: If you think you have taken too much of this medicine contact a poison control center or emergency room at once. NOTE: This medicine is only for you. Do not share this medicine with others. What if I miss a dose? It is important not to miss a dose. Call your doctor or health care professional if you are unable to keep an appointment. What may interact with this medicine? -medicines for seizures -medicines to increase blood counts like filgrastim, pegfilgrastim, sargramostim -some antibiotics like amikacin, gentamicin, neomycin, streptomycin, tobramycin -vaccines Talk to your doctor or health care professional before taking any of these medicines: -acetaminophen -aspirin -ibuprofen -ketoprofen -naproxen This list may not describe all possible interactions. Give your health care provider a list of all the  medicines, herbs, non-prescription drugs, or dietary supplements you use. Also tell them if you smoke, drink alcohol, or use illegal drugs. Some items may interact with your medicine. What should I watch for while using this medicine? Your condition will be monitored carefully while you are receiving this medicine. You will need important blood work done while you are taking this medicine. This drug may make you feel generally unwell. This is not uncommon, as chemotherapy can affect healthy cells as well as cancer cells. Report any side effects. Continue your course of treatment even though you feel ill unless your doctor tells you to stop. In some cases, you may be given additional medicines to help with side effects. Follow all directions for their use. Call your doctor or health care professional for advice if you get a fever, chills or sore throat, or other symptoms of a cold or flu. Do not treat yourself. This drug decreases your body's ability to fight infections. Try to avoid being around  people who are sick. This medicine may increase your risk to bruise or bleed. Call your doctor or health care professional if you notice any unusual bleeding. Be careful brushing and flossing your teeth or using a toothpick because you may get an infection or bleed more easily. If you have any dental work done, tell your dentist you are receiving this medicine. Avoid taking products that contain aspirin, acetaminophen, ibuprofen, naproxen, or ketoprofen unless instructed by your doctor. These medicines may hide a fever. Do not become pregnant while taking this medicine. Women should inform their doctor if they wish to become pregnant or think they might be pregnant. There is a potential for serious side effects to an unborn child. Talk to your health care professional or pharmacist for more information. Do not breast-feed an infant while taking this medicine. What side effects may I notice from receiving this  medicine? Side effects that you should report to your doctor or health care professional as soon as possible: -allergic reactions like skin rash, itching or hives, swelling of the face, lips, or tongue -signs of infection - fever or chills, cough, sore throat, pain or difficulty passing urine -signs of decreased platelets or bleeding - bruising, pinpoint red spots on the skin, black, tarry stools, nosebleeds -signs of decreased red blood cells - unusually weak or tired, fainting spells, lightheadedness -breathing problems -changes in hearing -changes in vision -chest pain -high blood pressure -low blood counts - This drug may decrease the number of white blood cells, red blood cells and platelets. You may be at increased risk for infections and bleeding. -nausea and vomiting -pain, swelling, redness or irritation at the injection site -pain, tingling, numbness in the hands or feet -problems with balance, talking, walking -trouble passing urine or change in the amount of urine Side effects that usually do not require medical attention (report to your doctor or health care professional if they continue or are bothersome): -hair loss -loss of appetite -metallic taste in the mouth or changes in taste This list may not describe all possible side effects. Call your doctor for medical advice about side effects. You may report side effects to FDA at 1-800-FDA-1088. Where should I keep my medicine? This drug is given in a hospital or clinic and will not be stored at home. NOTE: This sheet is a summary. It may not cover all possible information. If you have questions about this medicine, talk to your doctor, pharmacist, or health care provider.  2018 Elsevier/Gold Standard (2007-04-24 14:38:05)

## 2016-11-04 NOTE — Progress Notes (Signed)
Pt potassium 3.0 today. Notified Dr.Gudena and obtained order for 82meq kcl po to send to pt pharmacy today. Notified infusion RN.

## 2016-11-09 ENCOUNTER — Telehealth: Payer: Self-pay

## 2016-11-09 NOTE — Telephone Encounter (Signed)
Thank you :)

## 2016-11-09 NOTE — Telephone Encounter (Signed)
Pt called with bad headache. XS tylenol not touching it. Started today suddenly behind her forehead, constant ache, not throbbing, not pressure.  No nausea, is light sensitive, not positional, no visual changes. It does not feel like her "normal" migraine. Checked with pharmacy and it is OK to take her maxalt. Also suggested ice pack or cold to forehead.  She stated she will use her maxalt. Instructed her to call back if that does not help.   Herc/perj/carbo/docetaxel on 10/5 Next lab/MD on 10/12

## 2016-11-11 ENCOUNTER — Ambulatory Visit (HOSPITAL_BASED_OUTPATIENT_CLINIC_OR_DEPARTMENT_OTHER): Payer: 59

## 2016-11-11 ENCOUNTER — Telehealth: Payer: Self-pay

## 2016-11-11 ENCOUNTER — Telehealth: Payer: Self-pay | Admitting: *Deleted

## 2016-11-11 ENCOUNTER — Other Ambulatory Visit (HOSPITAL_BASED_OUTPATIENT_CLINIC_OR_DEPARTMENT_OTHER): Payer: 59

## 2016-11-11 ENCOUNTER — Ambulatory Visit (HOSPITAL_BASED_OUTPATIENT_CLINIC_OR_DEPARTMENT_OTHER): Payer: 59 | Admitting: Hematology and Oncology

## 2016-11-11 DIAGNOSIS — C50512 Malignant neoplasm of lower-outer quadrant of left female breast: Secondary | ICD-10-CM | POA: Diagnosis not present

## 2016-11-11 DIAGNOSIS — C773 Secondary and unspecified malignant neoplasm of axilla and upper limb lymph nodes: Secondary | ICD-10-CM | POA: Diagnosis not present

## 2016-11-11 DIAGNOSIS — Z452 Encounter for adjustment and management of vascular access device: Secondary | ICD-10-CM | POA: Diagnosis not present

## 2016-11-11 DIAGNOSIS — Z171 Estrogen receptor negative status [ER-]: Secondary | ICD-10-CM

## 2016-11-11 DIAGNOSIS — Z95828 Presence of other vascular implants and grafts: Secondary | ICD-10-CM

## 2016-11-11 LAB — CBC WITH DIFFERENTIAL/PLATELET
BASO%: 0.6 % (ref 0.0–2.0)
Basophils Absolute: 0.1 10*3/uL (ref 0.0–0.1)
EOS%: 0.4 % (ref 0.0–7.0)
Eosinophils Absolute: 0.1 10*3/uL (ref 0.0–0.5)
HCT: 36.9 % (ref 34.8–46.6)
HGB: 12.2 g/dL (ref 11.6–15.9)
LYMPH%: 14.6 % (ref 14.0–49.7)
MCH: 29.9 pg (ref 25.1–34.0)
MCHC: 33.1 g/dL (ref 31.5–36.0)
MCV: 90.3 fL (ref 79.5–101.0)
MONO#: 1.4 10*3/uL — ABNORMAL HIGH (ref 0.1–0.9)
MONO%: 6.8 % (ref 0.0–14.0)
NEUT#: 15.6 10*3/uL — ABNORMAL HIGH (ref 1.5–6.5)
NEUT%: 77.6 % — ABNORMAL HIGH (ref 38.4–76.8)
Platelets: 175 10*3/uL (ref 145–400)
RBC: 4.09 10*6/uL (ref 3.70–5.45)
RDW: 12.7 % (ref 11.2–14.5)
WBC: 20.1 10*3/uL — ABNORMAL HIGH (ref 3.9–10.3)
lymph#: 2.9 10*3/uL (ref 0.9–3.3)

## 2016-11-11 LAB — COMPREHENSIVE METABOLIC PANEL
ALT: 22 U/L (ref 0–55)
AST: 24 U/L (ref 5–34)
Albumin: 3.5 g/dL (ref 3.5–5.0)
Alkaline Phosphatase: 115 U/L (ref 40–150)
Anion Gap: 7 mEq/L (ref 3–11)
BUN: 6.2 mg/dL — ABNORMAL LOW (ref 7.0–26.0)
CO2: 27 mEq/L (ref 22–29)
Calcium: 9 mg/dL (ref 8.4–10.4)
Chloride: 104 mEq/L (ref 98–109)
Creatinine: 0.8 mg/dL (ref 0.6–1.1)
EGFR: >60 mL/min/{1.73_m2} (ref >60)
Glucose: 129 mg/dl (ref 70–140)
Potassium: 3.4 mEq/L — ABNORMAL LOW (ref 3.5–5.1)
Sodium: 138 mEq/L (ref 136–145)
Total Bilirubin: 0.22 mg/dL (ref 0.20–1.20)
Total Protein: 6.7 g/dL (ref 6.4–8.3)

## 2016-11-11 MED ORDER — HEPARIN SOD (PORK) LOCK FLUSH 100 UNIT/ML IV SOLN
500.0000 [IU] | Freq: Once | INTRAVENOUS | Status: AC
Start: 2016-11-11 — End: 2016-11-11
  Administered 2016-11-11: 500 [IU] via INTRAVENOUS
  Filled 2016-11-11: qty 5

## 2016-11-11 MED ORDER — SODIUM CHLORIDE 0.9% FLUSH
10.0000 mL | INTRAVENOUS | Status: DC | PRN
  Administered 2016-11-11: 10 mL via INTRAVENOUS
  Filled 2016-11-11: qty 10

## 2016-11-11 NOTE — Telephone Encounter (Signed)
Incoming telehealth fax: pt called telehealth 11/10/16 at 511 pm with c/o lower back pain not helped by 2 motrin. Diarrhea helped by imodium. And fever 100.2.   lvm to call back  Lower back pulsating jolting pains. Radiating 12 inches up and down. Lumbosacral area. She has appt this morning with Dr Lindi Adie and will discuss with him then. Her temp is down to 99 range.

## 2016-11-11 NOTE — Patient Instructions (Signed)
Implanted Port Home Guide An implanted port is a type of central line that is placed under the skin. Central lines are used to provide IV access when treatment or nutrition needs to be given through a person's veins. Implanted ports are used for long-term IV access. An implanted port may be placed because:  You need IV medicine that would be irritating to the small veins in your hands or arms.  You need long-term IV medicines, such as antibiotics.  You need IV nutrition for a long period.  You need frequent blood draws for lab tests.  You need dialysis.  Implanted ports are usually placed in the chest area, but they can also be placed in the upper arm, the abdomen, or the leg. An implanted port has two main parts:  Reservoir. The reservoir is round and will appear as a small, raised area under your skin. The reservoir is the part where a needle is inserted to give medicines or draw blood.  Catheter. The catheter is a thin, flexible tube that extends from the reservoir. The catheter is placed into a large vein. Medicine that is inserted into the reservoir goes into the catheter and then into the vein.  How will I care for my incision site? Do not get the incision site wet. Bathe or shower as directed by your health care provider. How is my port accessed? Special steps must be taken to access the port:  Before the port is accessed, a numbing cream can be placed on the skin. This helps numb the skin over the port site.  Your health care provider uses a sterile technique to access the port. ? Your health care provider must put on a mask and sterile gloves. ? The skin over your port is cleaned carefully with an antiseptic and allowed to dry. ? The port is gently pinched between sterile gloves, and a needle is inserted into the port.  Only "non-coring" port needles should be used to access the port. Once the port is accessed, a blood return should be checked. This helps ensure that the port  is in the vein and is not clogged.  If your port needs to remain accessed for a constant infusion, a clear (transparent) bandage will be placed over the needle site. The bandage and needle will need to be changed every week, or as directed by your health care provider.  Keep the bandage covering the needle clean and dry. Do not get it wet. Follow your health care provider's instructions on how to take a shower or bath while the port is accessed.  If your port does not need to stay accessed, no bandage is needed over the port.  What is flushing? Flushing helps keep the port from getting clogged. Follow your health care provider's instructions on how and when to flush the port. Ports are usually flushed with saline solution or a medicine called heparin. The need for flushing will depend on how the port is used.  If the port is used for intermittent medicines or blood draws, the port will need to be flushed: ? After medicines have been given. ? After blood has been drawn. ? As part of routine maintenance.  If a constant infusion is running, the port may not need to be flushed.  How long will my port stay implanted? The port can stay in for as long as your health care provider thinks it is needed. When it is time for the port to come out, surgery will be   done to remove it. The procedure is similar to the one performed when the port was put in. When should I seek immediate medical care? When you have an implanted port, you should seek immediate medical care if:  You notice a bad smell coming from the incision site.  You have swelling, redness, or drainage at the incision site.  You have more swelling or pain at the port site or the surrounding area.  You have a fever that is not controlled with medicine.  This information is not intended to replace advice given to you by your health care provider. Make sure you discuss any questions you have with your health care provider. Document  Released: 01/17/2005 Document Revised: 06/25/2015 Document Reviewed: 09/24/2012 Elsevier Interactive Patient Education  2017 [employer]  

## 2016-11-11 NOTE — Progress Notes (Signed)
Patient Care Team: [name], MD as PCP - General (Family Medicine)  DIAGNOSIS:  Encounter Diagnosis  Name Primary?  . Malignant neoplasm of lower-outer quadrant of left breast of female, estrogen receptor negative ([place])     SUMMARY OF ONCOLOGIC HISTORY:   Malignant neoplasm of lower-outer quadrant of left breast of female, estrogen receptor negative ([place])   10/20/2016 Mammogram    Mammogram and ultrasound of the left breast revealed 1.7 cm mass at 4:00 position, 6:30 position 5 x 4 x 4 mm mass, 6:00 position 5 cm nipple 7 x 6 x 11 mm, left axillary lymph node with thickened cortex, T1c N1 stage II a AJCC 8      10/24/2016 Initial Diagnosis    Left breast biopsy 6:30 position 3 cm from nipple: IDC grade 2, DCIS, ER 0%, PR 0%, Ki-67 15% HER-2 positive ratio 2.1; 4:00 position 3 cm from nipple: IDC grade 2, DCIS, ER 0%, PR 0%, Ki-67 35%, HER-2 positive ratio 2.02; left axillary lymph node biopsy positive      11/04/2016 -  Neo-Adjuvant Chemotherapy    TCH Perjeta 6 cycles followed by Herceptin +/- Perjeta maintenance       CHIEF COMPLIANT: Cycle 1 day 8 TCH Perjeta  INTERVAL HISTORY: Laura Anthony is a 41 year old with above-mentioned history left breast cancer who is currently on neoadjuvant chemotherapy with Penermon. Today is cycle 1 day 8. She complains of tiredness. Denies any nausea vomiting. She had severe panic attack after chemo.diarrhea for 3 days. Stopped after taking imodium. Back spasms.  REVIEW OF SYSTEMS:   Constitutional: Denies fevers, chills or abnormal weight loss Eyes: Denies blurriness of vision Ears, nose, mouth, throat, and face: Denies mucositis or sore throat Respiratory: Denies cough, dyspnea or wheezes Cardiovascular: Denies palpitation, chest discomfort Gastrointestinal:  Denies nausea, heartburn or change in bowel habits Skin: Denies abnormal skin rashes Lymphatics: Denies new lymphadenopathy or easy bruising Neurological:Denies  numbness, tingling or new weaknesses Behavioral/Psych: Mood is stable, no new changes  Extremities: No lower extremity edema All other systems were reviewed with the patient and are negative.  I have reviewed the past medical history, past surgical history, social history and family history with the patient and they are unchanged from previous note.  ALLERGIES:  is allergic to morphine and related.  MEDICATIONS:  Current Outpatient Prescriptions  Medication Sig Dispense Refill  . ALPRAZolam (XANAX) 0.5 MG tablet Take 0.5 mg by mouth at bedtime as needed for anxiety.    Marland Kitchen amphetamine-dextroamphetamine (ADDERALL) 20 MG tablet Take 20 mg by mouth daily as needed.    . busPIRone (BUSPAR) 5 MG tablet Take 5 mg by mouth daily as needed.    Marland Kitchen dexamethasone (DECADRON) 4 MG tablet Take 1 tablet (4 mg total) by mouth daily. Date 1 tablet daily before chemotherapy and 1 tablet the day after 12 tablet 0  . FLUoxetine (PROZAC) 20 MG capsule Take 20 mg by mouth every morning.    . lidocaine-prilocaine (EMLA) cream Apply to affected area once 30 g 3  . lisdexamfetamine (VYVANSE) 40 MG capsule Take 40 mg by mouth every morning.     . loperamide (IMODIUM) 2 MG capsule Take 2 mg by mouth as needed for diarrhea or loose stools.    Marland Kitchen loratadine (CLARITIN) 10 MG tablet Take 10 mg by mouth daily as needed for allergies.    Marland Kitchen LORazepam (ATIVAN) 0.5 MG tablet Take 1 tablet (0.5 mg total) by mouth at bedtime. As needed for sleep 30  tablet 0  . Nutritional Supplements (JUICE PLUS FIBRE PO) Take 2 each by mouth daily. [employer]    . Nutritional Supplements (JUICE PLUS FIBRE PO) Take 2 each by mouth daily. [employer]    . Nutritional Supplements (JUICE PLUS FIBRE PO) Take 2 each by mouth daily. [employer]    . Nutritional Supplements (JUICE PLUS FIBRE PO) Take 2 each by mouth daily. [employer]    . ondansetron (ZOFRAN) 8 MG tablet Take 1 tablet (8 mg total) by mouth 2 (two) times daily as needed for  refractory nausea / vomiting. Start on day 3 after chemo. 30 tablet 1  . potassium chloride SA (K-DUR,KLOR-CON) 20 MEQ tablet Take 1 tablet (20 mEq total) by mouth daily. 30 tablet 1  . prochlorperazine (COMPAZINE) 10 MG tablet Take 1 tablet (10 mg total) by mouth every 6 (six) hours as needed (Nausea or vomiting). 30 tablet 1  . rizatriptan (MAXALT) 10 MG tablet Take 10 mg by mouth as needed for migraine. May repeat in 2 hours if needed    . zolpidem (AMBIEN) 10 MG tablet Take 10 mg by mouth at bedtime as needed for sleep.     No current facility-administered medications for this visit.     PHYSICAL EXAMINATION: ECOG PERFORMANCE STATUS: 1 - Symptomatic but completely ambulatory  Vitals:   11/11/16 1209  BP: 125/75  Pulse: 86  Resp: 17  Temp: 98.4 F (36.9 C)  SpO2: 100%   Filed Weights   11/11/16 1209  Weight: 138 lb 14.4 oz (63 kg)    GENERAL:alert, no distress and comfortable SKIN: skin color, texture, turgor are normal, no rashes or significant lesions EYES: normal, Conjunctiva are pink and non-injected, sclera clear OROPHARYNX:no exudate, no erythema and lips, buccal mucosa, and tongue normal  NECK: supple, thyroid normal size, non-tender, without nodularity LYMPH:  no palpable lymphadenopathy in the cervical, axillary or inguinal LUNGS: clear to auscultation and percussion with normal breathing effort HEART: regular rate & rhythm and no murmurs and no lower extremity edema ABDOMEN:abdomen soft, non-tender and normal bowel sounds MUSCULOSKELETAL:no cyanosis of digits and no clubbing  NEURO: alert & oriented x 3 with fluent speech, no focal motor/sensory deficits EXTREMITIES: No lower extremity edema   LABORATORY DATA:  I have reviewed the data as listed   Chemistry      Component Value Date/Time   NA 138 11/11/2016 1134   K 3.4 (L) 11/11/2016 1134   CL 103 10/19/2010 2105   CO2 27 11/11/2016 1134   BUN 6.2 (L) 11/11/2016 1134   CREATININE 0.8 11/11/2016 1134       Component Value Date/Time   CALCIUM 9.0 11/11/2016 1134   ALKPHOS 115 11/11/2016 1134   AST 24 11/11/2016 1134   ALT 22 11/11/2016 1134   BILITOT 0.22 11/11/2016 1134       Lab Results  Component Value Date   WBC 20.1 (H) 11/11/2016   HGB 12.2 11/11/2016   HCT 36.9 11/11/2016   MCV 90.3 11/11/2016   PLT 175 11/11/2016   NEUTROABS 15.6 (H) 11/11/2016    ASSESSMENT & PLAN:  Malignant neoplasm of lower-outer quadrant of left breast of female, estrogen receptor negative ([facility]) Mammogram and ultrasound of the left breast revealed 1.7 cm mass at 4:00 position, 6:30 position 5 x 4 x 4 mm mass, 6:00 position 5 cm nipple 7 x 6 x 11 mm, left axillary lymph node with thickened cortex, T1c N1 stage II a AJCC 8  10/24/2016: Left breast  biopsy 6:30 position 3 cm from nipple: IDC grade 2, DCIS, ER 0%, PR 0%, 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 Neoadjuvant chemotherapy with TCHP This would be followed by Herceptin maintenance for 1 year. 2. followed by breast conserving surgery if it is feasible 3. Followed by adjuvant radiation ---------------------------------------------------------------------------- Current treatment: Cycle 1 day 8 TCH Perjeta Echocardiogram 11/03/2016: EF 55-60%  Chemotherapy toxicities: 1. Diarrhea: Stopped after taking imodium 2. Severe fatigue: slowly improving 3. Panic attacks: D/C oral and IV decadron 4. Bone pain from Neulasta 5. Headaches from zofran  Return to clinic in 2 weeks for cycle 2  I spent 25 minutes talking to the patient of which more than half was spent in counseling and coordination of care.  No orders of the defined types were placed in this encounter.  The patient has a good understanding of the overall plan. she agrees with it. she will call with any problems that may  develop before the next visit here.   Rulon Eisenmenger, MD 11/11/16

## 2016-11-11 NOTE — Telephone Encounter (Signed)
Pt called for results of K+.  Informed that K+ was 3.4 today & no need for IV KCL.  Discussed foods high in K+ & encouraged to cont oral K+.  She asked when her next appt was with Dr Laura Anthony.  She did not stop by schedulers.  She knows about chemo appt & genetics appt but no further f/u with Dr Laura Anthony.  Informed that message would be sent to him.

## 2016-11-11 NOTE — Assessment & Plan Note (Signed)
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 Neoadjuvant chemotherapy with TCHP This would be followed by Herceptin maintenance for 1 year. 2. followed by breast conserving surgery if it is feasible 3. Followed by adjuvant radiation ---------------------------------------------------------------------------- Current treatment: Cycle 1 day 8 TCH Perjeta Echocardiogram 11/03/2016: EF 55-60%  Chemotherapy toxicities:  Return to clinic in 2 weeks for cycle 2

## 2016-11-18 ENCOUNTER — Telehealth: Payer: Self-pay | Admitting: Hematology and Oncology

## 2016-11-18 NOTE — Telephone Encounter (Signed)
Spoke to patient regarding upcoming October, November and December appointments.

## 2016-11-23 ENCOUNTER — Ambulatory Visit (HOSPITAL_BASED_OUTPATIENT_CLINIC_OR_DEPARTMENT_OTHER): Payer: 59 | Admitting: Genetic Counselor

## 2016-11-23 ENCOUNTER — Telehealth: Payer: Self-pay | Admitting: Hematology and Oncology

## 2016-11-23 ENCOUNTER — Other Ambulatory Visit: Payer: 59

## 2016-11-23 ENCOUNTER — Encounter: Payer: Self-pay | Admitting: Genetic Counselor

## 2016-11-23 DIAGNOSIS — Z8042 Family history of malignant neoplasm of prostate: Secondary | ICD-10-CM

## 2016-11-23 DIAGNOSIS — C50512 Malignant neoplasm of lower-outer quadrant of left female breast: Secondary | ICD-10-CM

## 2016-11-23 DIAGNOSIS — Z801 Family history of malignant neoplasm of trachea, bronchus and lung: Secondary | ICD-10-CM

## 2016-11-23 DIAGNOSIS — Z853 Personal history of malignant neoplasm of breast: Secondary | ICD-10-CM | POA: Diagnosis not present

## 2016-11-23 DIAGNOSIS — Z7183 Encounter for nonprocreative genetic counseling: Secondary | ICD-10-CM | POA: Diagnosis not present

## 2016-11-23 DIAGNOSIS — Z171 Estrogen receptor negative status [ER-]: Secondary | ICD-10-CM

## 2016-11-23 DIAGNOSIS — Z803 Family history of malignant neoplasm of breast: Secondary | ICD-10-CM

## 2016-11-23 NOTE — Progress Notes (Signed)
REFERRING PROVIDER: Nicholas Lose, MD [address] [place], [place] [address]  PRIMARY PROVIDER:  [name], MD  PRIMARY REASON FOR VISIT:  1. Malignant neoplasm of lower-outer quadrant of left breast of female, estrogen receptor negative ([place])   2. Family history of breast cancer   3. Family history of prostate cancer      HISTORY OF PRESENT ILLNESS:   Laura Anthony, a 41 y.o. female, was seen for a [place] cancer genetics consultation at the request of Dr. Lindi Adie due to a personal and family history of cancer.  Laura Anthony presents to clinic today to discuss the possibility of a hereditary predisposition to cancer, genetic testing, and to further clarify her future cancer risks, as well as potential cancer risks for family members.   In October 2018, at the age of 53, Laura Anthony was diagnosed with invasive ductal carcinoma of the left breast. The tumor is ER/PR-, Her2+.This is being treated with chemotherapy.  She is coming for genetic testing to determine surgical status.      CANCER HISTORY:    Malignant neoplasm of lower-outer quadrant of left breast of female, estrogen receptor negative ([place])   10/20/2016 Mammogram    Mammogram and ultrasound of the left breast revealed 1.7 cm mass at 4:00 position, 6:30 position 5 x 4 x 4 mm mass, 6:00 position 5 cm nipple 7 x 6 x 11 mm, left axillary lymph node with thickened cortex, T1c N1 stage II a AJCC 8      10/24/2016 Initial Diagnosis    Left breast biopsy 6:30 position 3 cm from nipple: IDC grade 2, DCIS, ER 0%, PR 0%, Ki-67 15% HER-2 positive ratio 2.1; 4:00 position 3 cm from nipple: IDC grade 2, DCIS, ER 0%, PR 0%, Ki-67 35%, HER-2 positive ratio 2.02; left axillary lymph node biopsy positive      11/04/2016 -  Neo-Adjuvant Chemotherapy    TCH Perjeta 6 cycles followed by Herceptin +/- Perjeta maintenance        HORMONAL RISK FACTORS:  Menarche was at age 5.  First live birth at age 21.  OCP use  for approximately 10 years.  Ovaries intact: yes.  Hysterectomy: no.  Menopausal status: premenopausal.  HRT use: 0 years. Colonoscopy: no; not examined. Mammogram within the last year: no. Number of breast biopsies: 1. Up to date with pelvic exams:  yes. Any excessive radiation exposure in the past:  no  Past Medical History:  Diagnosis Date  . ADD (attention deficit disorder)   . Anxiety   . 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 Cancer ([place])    left breast  . DVT (deep venous thrombosis) ([place]) 2017   calf left - probably due to [facility] pills-  . Family history of breast cancer   . Family history of prostate cancer   . Headache    migraine  . History of seizure    x 1 as a child - was never on anticonvulsants  . Impingement syndrome of right shoulder 07/2013  . PONV (postoperative nausea and vomiting)   . Right bicipital tenosynovitis 07/2013  . Rotator cuff impingement syndrome of left shoulder 07/12/2013  . Seizures ([place])     1 random as a child    Past Surgical History:  Procedure Laterality Date  . ADENOIDECTOMY    . ANKLE ARTHROSCOPY Right   . KNEE ARTHROSCOPY Right   . KNEE ARTHROSCOPY W/ ACL RECONSTRUCTION Left   . PORTACATH  PLACEMENT N/A 11/01/2016   Procedure: INSERTION PORT-A-CATH WITH [place];  Surgeon: Rolm Bookbinder, MD;  Location: [facility];  Service: General;  Laterality: N/A;  . SEPTOPLASTY WITH ETHMOIDECTOMY, AND MAXILLARY ANTROSTOMY  10/29/2010   bilat. max. antrostomy with left max. stripping; left ant. ethmoidectomy; right total ethmoidectomy; sphenoidotomy  . SHOULDER ARTHROSCOPY WITH SUBACROMIAL DECOMPRESSION AND BICEP TENDON REPAIR Right 07/12/2013   Procedure: RIGHT SHOULDER ARTHROSCOPY DEBRIDEMENT EXTENSIVE DECOMPRESSION SUBACROMIAL PARTIAL ACROMIOPLASTY;  Surgeon: Johnny Bridge, MD;  Location: [facility];  Service: Orthopedics;  Laterality: Right;  . WRIST ARTHROSCOPY  01/17/2012   Procedure: ARTHROSCOPY  WRIST; right wrist Surgeon: Tennis Must, MD;  Location: [facility];  Service: Orthopedics;  Laterality: Right;  RIGHT WRIST ARTHROSCOPY WITH TRIANGULAR FIBROCARTILAGE COMPLEX REPAIR AND DEBRIDEMENT     Social History   Social History  . Marital status: Married    Spouse name: N/A  . Number of children: N/A  . Years of education: N/A   Social History Main Topics  . Smoking status: Never Smoker  . Smokeless tobacco: Never Used  . Alcohol use Yes     Comment: socially  . Drug use: No  . Sexual activity: Not on file   Other Topics Concern  . Not on file   Social History Narrative  . No narrative on file     FAMILY HISTORY:  We obtained a detailed, 4-generation family history.  Significant diagnoses are listed below: Family History  Problem Relation Age of Onset  . Breast cancer Mother 47       triple negative  . Leukemia Father   . Lung cancer Father   . Heart attack Maternal Uncle   . Prostate cancer Paternal Uncle   . COPD Paternal Grandmother   . Heart disease Paternal Grandfather   . Prostate cancer Paternal Uncle   . Leukemia Cousin     The patient has two sons and a daughter who are cancer free.  She does not have full siblings, but has two paternal half brothers and a paternal half sister.  Her siblings are cancer free.  Both parents are deceased.  The patient's mother was diagnosed with triple negative breast cancer at age 68 and died at 79. She reportedly was tested for BRCA mutations around 2011 and was negative. She has one brother who died of a heart attack at 25.  Both maternal grandparents are deceased and died in their 68's-90's from non cancer related issues.  The patient's father was diagnosed with leukemia, and ultimately lung cancer and died at 38.  He had three brothers.  One brother died of prostate cancer and another brother currently has prostate cancer.  The paternal grandparents are deceased from non cancer related  issues.  Patient's maternal ancestors are of [place] descent, and paternal ancestors are of [place] and [place] descent. There is no reported Ashkenazi Jewish ancestry. There is no known consanguinity.  GENETIC COUNSELING ASSESSMENT: Laura Anthony is a 41 y.o. female with a personal and family history of breast cancer and family history of prostate cancer which is somewhat suggestive of a hereditary cancer syndrome and predisposition to cancer. We, therefore, discussed and recommended the following at today's visit.   DISCUSSION: We discussed that about 5-10% of breast cancer is hereditary with most cases due to BRCA mutations.  The patient's mother was tested about 6-8 years ago for BRCA mutations and was found to be negative.  We discussed that since that time there have been some  hereditary mutations that have been identified that we could not pick up.  Additionally, there are more genes we can look at.  Other genes associated with young, triple negative breast cancer is PALB2.  We can also see ATM and CHEK2 mutations in young breast cancers. We reviewed the characteristics, features and inheritance patterns of hereditary cancer syndromes. We also discussed genetic testing, including the appropriate family members to test, the process of testing, insurance coverage and turn-around-time for results. We discussed the implications of a negative, positive and/or variant of uncertain significant result. We recommended Laura Anthony pursue genetic testing for the common hereditary cancer gene panel. The Hereditary Gene Panel offered by [facility] includes sequencing and/or deletion duplication testing of the following 47 genes: APC, ATM, AXIN2, BARD1, BMPR1A, BRCA1, BRCA2, BRIP1, CDH1, CDK4, CDKN2A (p14ARF), CDKN2A (p16INK4a), CHEK2, CTNNA1, DICER1, EPCAM (Deletion/duplication testing only), GREM1 (promoter region deletion/duplication testing only), KIT, MEN1, MLH1, MSH2, MSH3, MSH6, MUTYH, NBN, NF1, NHTL1, PALB2,  PDGFRA, PMS2, POLD1, POLE, PTEN, RAD50, RAD51C, RAD51D, SDHB, SDHC, SDHD, SMAD4, SMARCA4. STK11, TP53, TSC1, TSC2, and VHL.  The following genes were evaluated for sequence changes only: SDHA and HOXB13 c.251G>A variant only.   Based on Ms. Wandler's personal and family history of cancer, she meets medical criteria for genetic testing. Despite that she meets criteria, she may still have an out of pocket cost. We discussed that if her out of pocket cost for testing is over $100, the laboratory will call and confirm whether she wants to proceed with testing.  If the out of pocket cost of testing is less than $100 she will be billed by the genetic testing laboratory.   PLAN: After considering the risks, benefits, and limitations, Laura Anthony  provided informed consent to pursue genetic testing and the blood sample was sent to [facility] for analysis of the Common Hereditary cancer panel. Results should be available within approximately 2-3 weeks' time, at which point they will be disclosed by telephone to Laura Anthony, as will any additional recommendations warranted by these results. Laura Anthony will receive a summary of her genetic counseling visit and a copy of her results once available. This information will also be available in Epic. We encouraged Ms. Kana to remain in contact with cancer genetics annually so that we can continuously update the family history and inform her of any changes in cancer genetics and testing that may be of benefit for her family. Ms. Wismer questions were answered to her satisfaction today. Our contact information was provided should additional questions or concerns arise.  Lastly, we encouraged Ms. Bralley to remain in contact with cancer genetics annually so that we can continuously update the family history and inform her of any changes in cancer genetics and testing that may be of benefit for this family.   Ms.  Anthony questions were answered to  her satisfaction today. Our contact information was provided should additional questions or concerns arise. Thank you for the referral and allowing [place] to share in the care of your patient.   Laura Anthony P. Florene Glen, [place], [facility] Certified Genetic Counselor Santiago [email or web address] phone: [phone]  The patient was seen for a total of 45 minutes in face-to-face genetic counseling.  This patient was discussed with Drs. Magrinat, Lindi Adie and/or Burr Medico who agrees with the above.    _______________________________________________________________________ For Office Staff:  Number of people involved in session: 1 Was an Intern/ student involved with case: no

## 2016-11-23 NOTE — Telephone Encounter (Signed)
Patient called in to confirm appointment time

## 2016-11-25 ENCOUNTER — Other Ambulatory Visit: Payer: Self-pay | Admitting: Hematology and Oncology

## 2016-11-25 ENCOUNTER — Ambulatory Visit: Payer: 59

## 2016-11-25 ENCOUNTER — Ambulatory Visit (HOSPITAL_BASED_OUTPATIENT_CLINIC_OR_DEPARTMENT_OTHER): Payer: 59 | Admitting: Oncology

## 2016-11-25 ENCOUNTER — Encounter: Payer: Self-pay | Admitting: Oncology

## 2016-11-25 ENCOUNTER — Encounter: Payer: Self-pay | Admitting: *Deleted

## 2016-11-25 ENCOUNTER — Other Ambulatory Visit (HOSPITAL_BASED_OUTPATIENT_CLINIC_OR_DEPARTMENT_OTHER): Payer: 59

## 2016-11-25 ENCOUNTER — Ambulatory Visit (HOSPITAL_BASED_OUTPATIENT_CLINIC_OR_DEPARTMENT_OTHER): Payer: 59

## 2016-11-25 VITALS — BP 113/71 | HR 77 | Temp 98.4°F | Resp 20 | Ht 67.0 in | Wt 140.2 lb

## 2016-11-25 DIAGNOSIS — Z5112 Encounter for antineoplastic immunotherapy: Secondary | ICD-10-CM | POA: Diagnosis not present

## 2016-11-25 DIAGNOSIS — Z171 Estrogen receptor negative status [ER-]: Secondary | ICD-10-CM

## 2016-11-25 DIAGNOSIS — C50512 Malignant neoplasm of lower-outer quadrant of left female breast: Secondary | ICD-10-CM

## 2016-11-25 DIAGNOSIS — Z5111 Encounter for antineoplastic chemotherapy: Secondary | ICD-10-CM

## 2016-11-25 DIAGNOSIS — Z5189 Encounter for other specified aftercare: Secondary | ICD-10-CM

## 2016-11-25 LAB — CBC WITH DIFFERENTIAL/PLATELET
BASO%: 1.1 % (ref 0.0–2.0)
Basophils Absolute: 0.1 10*3/uL (ref 0.0–0.1)
EOS%: 0.5 % (ref 0.0–7.0)
Eosinophils Absolute: 0 10*3/uL (ref 0.0–0.5)
HCT: 37.5 % (ref 34.8–46.6)
HGB: 12.8 g/dL (ref 11.6–15.9)
LYMPH%: 30.3 % (ref 14.0–49.7)
MCH: 31 pg (ref 25.1–34.0)
MCHC: 34 g/dL (ref 31.5–36.0)
MCV: 91.3 fL (ref 79.5–101.0)
MONO#: 0.5 10*3/uL (ref 0.1–0.9)
MONO%: 8.7 % (ref 0.0–14.0)
NEUT#: 3.2 10*3/uL (ref 1.5–6.5)
NEUT%: 59.4 % (ref 38.4–76.8)
Platelets: 311 10*3/uL (ref 145–400)
RBC: 4.11 10*6/uL (ref 3.70–5.45)
RDW: 13.7 % (ref 11.2–14.5)
WBC: 5.3 10*3/uL (ref 3.9–10.3)
lymph#: 1.6 10*3/uL (ref 0.9–3.3)

## 2016-11-25 LAB — COMPREHENSIVE METABOLIC PANEL
ALT: 31 U/L (ref 0–55)
AST: 33 U/L (ref 5–34)
Albumin: 3.6 g/dL (ref 3.5–5.0)
Alkaline Phosphatase: 84 U/L (ref 40–150)
Anion Gap: 7 mEq/L (ref 3–11)
BUN: 14 mg/dL (ref 7.0–26.0)
CO2: 26 mEq/L (ref 22–29)
Calcium: 8.9 mg/dL (ref 8.4–10.4)
Chloride: 104 mEq/L (ref 98–109)
Creatinine: 0.7 mg/dL (ref 0.6–1.1)
EGFR: >60 mL/min/{1.73_m2} (ref >60)
Glucose: 101 mg/dl (ref 70–140)
Potassium: 3.8 mEq/L (ref 3.5–5.1)
Sodium: 137 mEq/L (ref 136–145)
Total Bilirubin: 0.5 mg/dL (ref 0.20–1.20)
Total Protein: 7 g/dL (ref 6.4–8.3)

## 2016-11-25 MED ORDER — TRASTUZUMAB CHEMO 150 MG IV SOLR
6.0000 mg/kg | Freq: Once | INTRAVENOUS | Status: AC
  Administered 2016-11-25: 378 mg via INTRAVENOUS
  Filled 2016-11-25: qty 18

## 2016-11-25 MED ORDER — SODIUM CHLORIDE 0.9 % IV SOLN
Freq: Once | INTRAVENOUS | Status: AC
  Administered 2016-11-25: 11:00:00 via INTRAVENOUS

## 2016-11-25 MED ORDER — SODIUM CHLORIDE 0.9 % IV SOLN
75.0000 mg/m2 | Freq: Once | INTRAVENOUS | Status: AC
  Administered 2016-11-25: 130 mg via INTRAVENOUS
  Filled 2016-11-25: qty 13

## 2016-11-25 MED ORDER — PALONOSETRON HCL INJECTION 0.25 MG/5ML
0.2500 mg | Freq: Once | INTRAVENOUS | Status: AC
  Administered 2016-11-25: 0.25 mg via INTRAVENOUS

## 2016-11-25 MED ORDER — HEPARIN SOD (PORK) LOCK FLUSH 100 UNIT/ML IV SOLN
500.0000 [IU] | Freq: Once | INTRAVENOUS | Status: AC | PRN
  Administered 2016-11-25: 500 [IU]
  Filled 2016-11-25: qty 5

## 2016-11-25 MED ORDER — PALONOSETRON HCL INJECTION 0.25 MG/5ML
INTRAVENOUS | Status: AC
  Filled 2016-11-25: qty 5

## 2016-11-25 MED ORDER — ACETAMINOPHEN 325 MG PO TABS
ORAL_TABLET | ORAL | Status: AC
  Filled 2016-11-25: qty 2

## 2016-11-25 MED ORDER — DIPHENHYDRAMINE HCL 25 MG PO CAPS
50.0000 mg | ORAL_CAPSULE | Freq: Once | ORAL | Status: AC
  Administered 2016-11-25: 50 mg via ORAL

## 2016-11-25 MED ORDER — SODIUM CHLORIDE 0.9 % IV SOLN
700.0000 mg | Freq: Once | INTRAVENOUS | Status: AC
  Administered 2016-11-25: 700 mg via INTRAVENOUS
  Filled 2016-11-25: qty 70

## 2016-11-25 MED ORDER — ACETAMINOPHEN 325 MG PO TABS
650.0000 mg | ORAL_TABLET | Freq: Once | ORAL | Status: AC
  Administered 2016-11-25: 650 mg via ORAL

## 2016-11-25 MED ORDER — DIPHENHYDRAMINE HCL 25 MG PO CAPS
ORAL_CAPSULE | ORAL | Status: AC
Start: 2016-11-25 — End: ?
  Filled 2016-11-25: qty 2

## 2016-11-25 MED ORDER — SODIUM CHLORIDE 0.9 % IV SOLN
420.0000 mg | Freq: Once | INTRAVENOUS | Status: AC
  Administered 2016-11-25: 420 mg via INTRAVENOUS
  Filled 2016-11-25: qty 14

## 2016-11-25 MED ORDER — PEGFILGRASTIM 6 MG/0.6ML ~~LOC~~ PSKT
6.0000 mg | PREFILLED_SYRINGE | Freq: Once | SUBCUTANEOUS | Status: AC
  Administered 2016-11-25: 6 mg via SUBCUTANEOUS
  Filled 2016-11-25: qty 0.6

## 2016-11-25 MED ORDER — SODIUM CHLORIDE 0.9% FLUSH
10.0000 mL | INTRAVENOUS | Status: DC | PRN
  Administered 2016-11-25: 10 mL
  Filled 2016-11-25: qty 10

## 2016-11-25 NOTE — Assessment & Plan Note (Signed)
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 Neoadjuvant chemotherapy with TCHP This would be followed by Herceptin maintenance for 1 year. 2. followed by breast conserving surgery if it is feasible 3. Followed by adjuvant radiation ---------------------------------------------------------------------------- Current treatment: Cycle 2 day 1 TCH Perjeta Echocardiogram 11/03/2016: EF 55-60%  Chemotherapy toxicities: 1. Diarrhea: Stopped after taking imodium 2. Severe fatigue: slowly improving 3. Panic attacks: D/C oral and IV decadron 4. Bone pain from Neulasta 5. Headaches from zofran  Return to clinic in 3 weeks for cycle 3

## 2016-11-25 NOTE — Progress Notes (Signed)
[facility] Follow up:    Laura Noon, MD [place] [address]   DIAGNOSIS: Cancer Staging Malignant neoplasm of lower-outer quadrant of left breast of female, estrogen receptor negative ([place]) Staging form: Breast, AJCC 8th Edition - Clinical: Stage IIA (cT1c, cN1, cM0, G2, ER: Negative, PR: Negative, HER2: Positive) - Signed by Gardenia Phlegm, NP on 11/02/2016   SUMMARY OF ONCOLOGIC HISTORY:   Malignant neoplasm of lower-outer quadrant of left breast of female, estrogen receptor negative ([place])   10/20/2016 Mammogram    Mammogram and ultrasound of the left breast revealed 1.7 cm mass at 4:00 position, 6:30 position 5 x 4 x 4 mm mass, 6:00 position 5 cm nipple 7 x 6 x 11 mm, left axillary lymph node with thickened cortex, T1c N1 stage II a AJCC 8      10/24/2016 Initial Diagnosis    Left breast biopsy 6:30 position 3 cm from nipple: IDC grade 2, DCIS, ER 0%, PR 0%, Ki-67 15% HER-2 positive ratio 2.1; 4:00 position 3 cm from nipple: IDC grade 2, DCIS, ER 0%, PR 0%, Ki-67 35%, HER-2 positive ratio 2.02; left axillary lymph node biopsy positive      11/04/2016 -  Neo-Adjuvant Chemotherapy    TCH Perjeta 6 cycles followed by Herceptin +/- Perjeta maintenance       CURRENT THERAPY: Cycle 2 day 1 TCH Perjeta  INTERVAL HISTORY: Laura Anthony 41 y.o. female returns for routine follow-up for evaluation prior to her next cycle of neoadjuvant chemotherapy. The patient is feeling well today. She has had intermittent diarrhea which is controlled with Imodium. She denies fevers and chills. Denies chest pain, shortness breath, cough, hemoptysis. Denies nausea, vomiting, constipation. The patient had pack attack after taking dexamethasone with her last cycle of chemotherapy. She will not receive additional dexamethasone with subsequent cycles of chemotherapy. The patient is here for evaluation prior to cycle 2 day 1 of [place].   Patient  Active Problem List   Diagnosis Date Noted  . Encounter for antineoplastic chemotherapy 11/25/2016  . Family history of breast cancer   . Family history of prostate cancer   . Malignant neoplasm of lower-outer quadrant of left breast of female, estrogen receptor negative ([place]) 10/27/2016  . Rotator cuff impingement syndrome of left shoulder 07/12/2013    is allergic to morphine and related.  MEDICAL HISTORY: Past Medical History:  Diagnosis Date  . ADD (attention deficit disorder)   . Anxiety   . 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 Cancer ([place])    left breast  . DVT (deep venous thrombosis) ([place]) 2017   calf left - probably due to [facility] pills-  . Family history of breast cancer   . Family history of prostate cancer   . Headache    migraine  . History of seizure    x 1 as a child - was never on anticonvulsants  . Impingement syndrome of right shoulder 07/2013  . PONV (postoperative nausea and vomiting)   . Right bicipital tenosynovitis 07/2013  . Rotator cuff impingement syndrome of left shoulder 07/12/2013  . Seizures ([place])     1 random as a child    SURGICAL HISTORY: Past Surgical History:  Procedure Laterality Date  . ADENOIDECTOMY    . ANKLE ARTHROSCOPY Right   . KNEE ARTHROSCOPY Right   . KNEE ARTHROSCOPY W/ ACL RECONSTRUCTION Left   . PORTACATH PLACEMENT N/A 11/01/2016   Procedure:  INSERTION PORT-A-CATH WITH [place];  Surgeon: Rolm Bookbinder, MD;  Location: [facility];  Service: General;  Laterality: N/A;  . SEPTOPLASTY WITH ETHMOIDECTOMY, AND MAXILLARY ANTROSTOMY  10/29/2010   bilat. max. antrostomy with left max. stripping; left ant. ethmoidectomy; right total ethmoidectomy; sphenoidotomy  . SHOULDER ARTHROSCOPY WITH SUBACROMIAL DECOMPRESSION AND BICEP TENDON REPAIR Right 07/12/2013   Procedure: RIGHT SHOULDER ARTHROSCOPY DEBRIDEMENT EXTENSIVE DECOMPRESSION SUBACROMIAL PARTIAL ACROMIOPLASTY;  Surgeon: Johnny Bridge, MD;  Location: [facility];  Service: Orthopedics;  Laterality: Right;  . WRIST ARTHROSCOPY  01/17/2012   Procedure: ARTHROSCOPY WRIST; right wrist Surgeon: Tennis Must, MD;  Location: [facility];  Service: Orthopedics;  Laterality: Right;  RIGHT WRIST ARTHROSCOPY WITH TRIANGULAR FIBROCARTILAGE COMPLEX REPAIR AND DEBRIDEMENT     SOCIAL HISTORY: Social History   Social History  . Marital status: Married    Spouse name: N/A  . Number of children: N/A  . Years of education: N/A   Occupational History  . Not on file.   Social History Main Topics  . Smoking status: Never Smoker  . Smokeless tobacco: Never Used  . Alcohol use Yes     Comment: socially  . Drug use: No  . Sexual activity: Not on file   Other Topics Concern  . Not on file   Social History Narrative  . No narrative on file    FAMILY HISTORY: Family History  Problem Relation Age of Onset  . Breast cancer Mother 34       triple negative  . Leukemia Father   . Lung cancer Father   . Heart attack Maternal Uncle   . Prostate cancer Paternal Uncle   . COPD Paternal Grandmother   . Heart disease Paternal Grandfather   . Prostate cancer Paternal Uncle   . Leukemia Cousin     Review of Systems  Constitutional: Negative.   HENT:  Negative.   Eyes: Negative.   Respiratory: Negative.   Cardiovascular: Negative.   Gastrointestinal: Positive for diarrhea. Negative for constipation, nausea and vomiting.  Genitourinary: Negative.    Musculoskeletal: Negative.   Skin: Negative.   Neurological: Negative.   Hematological: Negative.   Psychiatric/Behavioral: Negative.       PHYSICAL EXAMINATION  ECOG PERFORMANCE STATUS: 1 - Symptomatic but completely ambulatory  Vitals:   11/25/16 0959  BP: 113/71  Pulse: 77  Resp: 20  Temp: 98.4 F (36.9 C)  SpO2: 100%    Physical Exam  Constitutional: She is oriented to person, place, and time and well-developed, well-nourished, and in no distress. No  distress.  HENT:  Head: Normocephalic and atraumatic.  Mouth/Throat: Oropharynx is clear and moist. No oropharyngeal exudate.  Eyes: Conjunctivae are normal. Right eye exhibits no discharge. Left eye exhibits no discharge. No scleral icterus.  Neck: Normal range of motion. Neck supple.  Cardiovascular: Normal rate, regular rhythm, normal heart sounds and intact distal pulses.   Pulmonary/Chest: Effort normal and breath sounds normal. No respiratory distress. She has no wheezes. She has no rales.  Abdominal: Soft. Bowel sounds are normal. She exhibits no distension and no mass. There is no tenderness.  Musculoskeletal: Normal range of motion. She exhibits no edema.  Lymphadenopathy:    She has no cervical adenopathy.  Neurological: She is alert and oriented to person, place, and time. She exhibits normal muscle tone. Gait normal. Coordination normal.  Skin: Skin is warm and dry. No rash noted. She is not diaphoretic. No erythema. No pallor.  Psychiatric: Mood, memory, affect  and judgment normal.  Vitals reviewed.   LABORATORY DATA:  CBC    Component Value Date/Time   WBC 5.3 11/25/2016 0932   WBC 5.0 11/01/2016 0724   RBC 4.11 11/25/2016 0932   RBC 4.26 11/01/2016 0724   HGB 12.8 11/25/2016 0932   HCT 37.5 11/25/2016 0932   PLT 311 11/25/2016 0932   MCV 91.3 11/25/2016 0932   MCH 31.0 11/25/2016 0932   MCH 29.8 11/01/2016 0724   MCHC 34.0 11/25/2016 0932   MCHC 33.8 11/01/2016 0724   RDW 13.7 11/25/2016 0932   LYMPHSABS 1.6 11/25/2016 0932   MONOABS 0.5 11/25/2016 0932   EOSABS 0.0 11/25/2016 0932   BASOSABS 0.1 11/25/2016 0932    CMP     Component Value Date/Time   NA 137 11/25/2016 0933   K 3.8 11/25/2016 0933   CL 103 10/19/2010 2105   CO2 26 11/25/2016 0933   GLUCOSE 101 11/25/2016 0933   BUN 14.0 11/25/2016 0933   CREATININE 0.7 11/25/2016 0933   CALCIUM 8.9 11/25/2016 0933   PROT 7.0 11/25/2016 0933   ALBUMIN 3.6 11/25/2016 0933   AST 33 11/25/2016 0933    ALT 31 11/25/2016 0933   ALKPHOS 84 11/25/2016 0933   BILITOT 0.50 11/25/2016 0933    RADIOGRAPHIC STUDIES:  No results found.   ASSESSMENT and THERAPY PLAN:   Malignant neoplasm of lower-outer quadrant of left breast of female, estrogen receptor negative ([place]) Mammogram and ultrasound of the left breast revealed 1.7 cm mass at 4:00 position, 6:30 position 5 x 4 x 4 mm mass, 6:00 position 5 cm nipple 7 x 6 x 11 mm, left axillary lymph node with thickened cortex, T1c N1 stage II a AJCC 8  10/24/2016: Left breast biopsy 6:30 position 3 cm from nipple: IDC grade 2, DCIS, ER 0%, PR 0%, 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 Neoadjuvant chemotherapy with TCHP This would be followed by Herceptin maintenance for 1 year. 2. followed by breast conserving surgery if it is feasible 3. Followed by adjuvant radiation ---------------------------------------------------------------------------- Current treatment: Cycle 2 day 1 TCH Perjeta Echocardiogram 11/03/2016: EF 55-60%  Chemotherapy toxicities: 1. Diarrhea: Stopped after taking imodium 2. Severe fatigue: slowly improving 3. Panic attacks: D/C oral and IV decadron 4. Bone pain from Neulasta 5. Headaches from zofran  Return to clinic in 3 weeks for cycle 3   No orders of the defined types were placed in this encounter.   All questions were answered. The patient knows to call the clinic with any problems, questions or concerns. We can certainly see the patient much sooner if necessary.  Mikey Bussing, NP 11/25/2016

## 2016-11-25 NOTE — Patient Instructions (Signed)
[facility] Dis[facility] Instructions for Patients Receiving Chemotherapy  Today you received the following chemotherapy agents Herceptin, Perjeta, Taxotere, Carboplatin  To help prevent nausea and vomiting after your treatment, we encourage you to take your nausea medication as prescribed. If you develop nausea and vomiting that is not controlled by your nausea medication, call the clinic.   BELOW ARE SYMPTOMS THAT SHOULD BE REPORTED IMMEDIATELY:  *FEVER GREATER THAN 100.5 F  *CHILLS WITH OR WITHOUT FEVER  NAUSEA AND VOMITING THAT IS NOT CONTROLLED WITH YOUR NAUSEA MEDICATION  *UNUSUAL SHORTNESS OF BREATH  *UNUSUAL BRUISING OR BLEEDING  TENDERNESS IN MOUTH AND THROAT WITH OR WITHOUT PRESENCE OF ULCERS  *URINARY PROBLEMS  *BOWEL PROBLEMS  UNUSUAL RASH Items with * indicate a potential emergency and should be followed up as soon as possible.  Feel free to call the clinic should you have any questions or concerns. The clinic phone number is [phone].  Please show the Jeff at check-in to the Emergency Department and triage nurse.  Trastuzumab injection for infusion (Herceptin) What is this medicine? TRASTUZUMAB (tras TOO zoo mab) is a monoclonal antibody. It is used to treat breast cancer and stomach cancer. This medicine may be used for other purposes; ask your health care provider or pharmacist if you have questions. COMMON BRAND NAME(S): Herceptin What should I tell my health care provider before I take this medicine? They need to know if you have any of these conditions: -heart disease -heart failure -lung or breathing disease, like asthma -an unusual or allergic reaction to trastuzumab, benzyl alcohol, or other medications, foods, dyes, or preservatives -pregnant or trying to get pregnant -breast-feeding How should I use this medicine? This drug is given as an infusion into a vein. It is administered in a hospital or clinic by a specially  trained health care professional. Talk to your pediatrician regarding the use of this medicine in children. This medicine is not approved for use in children. Overdosage: If you think you have taken too much of this medicine contact a poison control center or emergency room at once. NOTE: This medicine is only for you. Do not share this medicine with others. What if I miss a dose? It is important not to miss a dose. Call your doctor or health care professional if you are unable to keep an appointment. What may interact with this medicine? This medicine may interact with the following medications: -certain types of chemotherapy, such as daunorubicin, doxorubicin, epirubicin, and idarubicin This list may not describe all possible interactions. Give your health care provider a list of all the medicines, herbs, non-prescription drugs, or dietary supplements you use. Also tell them if you smoke, drink alcohol, or use illegal drugs. Some items may interact with your medicine. What should I watch for while using this medicine? Visit your doctor for checks on your progress. Report any side effects. Continue your course of treatment even though you feel ill unless your doctor tells you to stop. Call your doctor or health care professional for advice if you get a fever, chills or sore throat, or other symptoms of a cold or flu. Do not treat yourself. Try to avoid being around people who are sick. You may experience fever, chills and shaking during your first infusion. These effects are usually mild and can be treated with other medicines. Report any side effects during the infusion to your health care professional. Fever and chills usually do not happen with later infusions. Do not become  pregnant while taking this medicine or for 7 months after stopping it. Women should inform their doctor if they wish to become pregnant or think they might be pregnant. Women of child-bearing potential will need to have a  negative pregnancy test before starting this medicine. There is a potential for serious side effects to an unborn child. Talk to your health care professional or pharmacist for more information. Do not breast-feed an infant while taking this medicine or for 7 months after stopping it. Women must use effective birth control with this medicine. What side effects may I notice from receiving this medicine? Side effects that you should report to your doctor or health care professional as soon as possible: -allergic reactions like skin rash, itching or hives, swelling of the face, lips, or tongue -chest pain or palpitations -cough -dizziness -feeling faint or lightheaded, falls -fever -general ill feeling or flu-like symptoms -signs of worsening heart failure like breathing problems; swelling in your legs and feet -unusually weak or tired Side effects that usually do not require medical attention (report to your doctor or health care professional if they continue or are bothersome): -bone pain -changes in taste -diarrhea -joint pain -nausea/vomiting -weight loss This list may not describe all possible side effects. Call your doctor for medical advice about side effects. You may report side effects to FDA at 1-800-FDA-1088. Where should I keep my medicine? This drug is given in a hospital or clinic and will not be stored at home. NOTE: This sheet is a summary. It may not cover all possible information. If you have questions about this medicine, talk to your doctor, pharmacist, or health care provider.  2018 [employer] Standard (2016-01-12 14:37:52)  Pertuzumab injection (Perjeta) What is this medicine? PERTUZUMAB (per TOOZ ue mab) is a monoclonal antibody. It is used to treat breast cancer. This medicine may be used for other purposes; ask your health care provider or pharmacist if you have questions. COMMON BRAND NAME(S): PERJETA What should I tell my health care provider before I take  this medicine? They need to know if you have any of these conditions: -heart disease -heart failure -high blood pressure -history of irregular heart beat -recent or ongoing radiation therapy -an unusual or allergic reaction to pertuzumab, other medicines, foods, dyes, or preservatives -pregnant or trying to get pregnant -breast-feeding How should I use this medicine? This medicine is for infusion into a vein. It is given by a health care professional in a hospital or clinic setting. Talk to your pediatrician regarding the use of this medicine in children. Special care may be needed. Overdosage: If you think you have taken too much of this medicine contact a poison control center or emergency room at once. NOTE: This medicine is only for you. Do not share this medicine with others. What if I miss a dose? It is important not to miss your dose. Call your doctor or health care professional if you are unable to keep an appointment. What may interact with this medicine? Interactions are not expected. Give your health care provider a list of all the medicines, herbs, non-prescription drugs, or dietary supplements you use. Also tell them if you smoke, drink alcohol, or use illegal drugs. Some items may interact with your medicine. This list may not describe all possible interactions. Give your health care provider a list of all the medicines, herbs, non-prescription drugs, or dietary supplements you use. Also tell them if you smoke, drink alcohol, or use illegal drugs. Some items may interact  with your medicine. What should I watch for while using this medicine? Your condition will be monitored carefully while you are receiving this medicine. Report any side effects. Continue your course of treatment even though you feel ill unless your doctor tells you to stop. Do not become pregnant while taking this medicine or for 7 months after stopping it. Women should inform their doctor if they wish to become  pregnant or think they might be pregnant. Women of child-bearing potential will need to have a negative pregnancy test before starting this medicine. There is a potential for serious side effects to an unborn child. Talk to your health care professional or pharmacist for more information. Do not breast-feed an infant while taking this medicine or for 7 months after stopping it. Women must use effective birth control with this medicine. Call your doctor or health care professional for advice if you get a fever, chills or sore throat, or other symptoms of a cold or flu. Do not treat yourself. Try to avoid being around people who are sick. You may experience fever, chills, and headache during the infusion. Report any side effects during the infusion to your health care professional. What side effects may I notice from receiving this medicine? Side effects that you should report to your doctor or health care professional as soon as possible: -breathing problems -chest pain or palpitations -dizziness -feeling faint or lightheaded -fever or chills -skin rash, itching or hives -sore throat -swelling of the face, lips, or tongue -swelling of the legs or ankles -unusually weak or tired Side effects that usually do not require medical attention (report to your doctor or health care professional if they continue or are bothersome): -diarrhea -hair loss -nausea, vomiting -tiredness This list may not describe all possible side effects. Call your doctor for medical advice about side effects. You may report side effects to FDA at 1-800-FDA-1088. Where should I keep my medicine? This drug is given in a hospital or clinic and will not be stored at home. NOTE: This sheet is a summary. It may not cover all possible information. If you have questions about this medicine, talk to your doctor, pharmacist, or health care provider.  2018 Else[employer]/Gold Standard (2015-02-19 12:08:50)  Docetaxel injection  (Taxotere) What is this medicine? DOCETAXEL (doe se TAX el) is a chemotherapy drug. It targets fast dividing cells, like cancer cells, and causes these cells to die. This medicine is used to treat many types of cancers like breast cancer, certain stomach cancers, head and neck cancer, lung cancer, and prostate cancer. This medicine may be used for other purposes; ask your health care provider or pharmacist if you have questions. COMMON BRAND NAME(S): Docefrez, Taxotere What should I tell my health care provider before I take this medicine? They need to know if you have any of these conditions: -infection (especially a virus infection such as chickenpox, cold sores, or herpes) -liver disease -low blood counts, like low white cell, platelet, or red cell counts -an unusual or allergic reaction to docetaxel, polysorbate 80, other chemotherapy agents, other medicines, foods, dyes, or preservatives -pregnant or trying to get pregnant -breast-feeding How should I use this medicine? This drug is given as an infusion into a vein. It is administered in a hospital or clinic by a specially trained health care professional. Talk to your pediatrician regarding the use of this medicine in children. Special care may be needed. Overdosage: If you think you have taken too much of this medicine contact a poison  control center or emergency room at once. NOTE: This medicine is only for you. Do not share this medicine with others. What if I miss a dose? It is important not to miss your dose. Call your doctor or health care professional if you are unable to keep an appointment. What may interact with this medicine? -cyclosporine -erythromycin -ketoconazole -medicines to increase blood counts like filgrastim, pegfilgrastim, sargramostim -vaccines Talk to your doctor or health care professional before taking any of these medicines: -acetaminophen -aspirin -ibuprofen -ketoprofen -naproxen This list may not  describe all possible interactions. Give your health care provider a list of all the medicines, herbs, non-prescription drugs, or dietary supplements you use. Also tell them if you smoke, drink alcohol, or use illegal drugs. Some items may interact with your medicine. What should I watch for while using this medicine? Your condition will be monitored carefully while you are receiving this medicine. You will need important blood work done while you are taking this medicine. This drug may make you feel generally unwell. This is not uncommon, as chemotherapy can affect healthy cells as well as cancer cells. Report any side effects. Continue your course of treatment even though you feel ill unless your doctor tells you to stop. In some cases, you may be given additional medicines to help with side effects. Follow all directions for their use. Call your doctor or health care professional for advice if you get a fever, chills or sore throat, or other symptoms of a cold or flu. Do not treat yourself. This drug decreases your body's ability to fight infections. Try to avoid being around people who are sick. This medicine may increase your risk to bruise or bleed. Call your doctor or health care professional if you notice any unusual bleeding. This medicine may contain alcohol in the product. You may get drowsy or dizzy. Do not drive, use machinery, or do anything that needs mental alertness until you know how this medicine affects you. Do not stand or sit up quickly, especially if you are an older patient. This reduces the risk of dizzy or fainting spells. Avoid alcoholic drinks. Do not become pregnant while taking this medicine. Women should inform their doctor if they wish to become pregnant or think they might be pregnant. There is a potential for serious side effects to an unborn child. Talk to your health care professional or pharmacist for more information. Do not breast-feed an infant while taking this  medicine. What side effects may I notice from receiving this medicine? Side effects that you should report to your doctor or health care professional as soon as possible: -allergic reactions like skin rash, itching or hives, swelling of the face, lips, or tongue -low blood counts - This drug may decrease the number of white blood cells, red blood cells and platelets. You may be at increased risk for infections and bleeding. -signs of infection - fever or chills, cough, sore throat, pain or difficulty passing urine -signs of decreased platelets or bleeding - bruising, pinpoint red spots on the skin, black, tarry stools, nosebleeds -signs of decreased red blood cells - unusually weak or tired, fainting spells, lightheadedness -breathing problems -fast or irregular heartbeat -low blood pressure -mouth sores -nausea and vomiting -pain, swelling, redness or irritation at the injection site -pain, tingling, numbness in the hands or feet -swelling of the ankle, feet, hands -weight gain Side effects that usually do not require medical attention (report to your doctor or health care professional if they continue or  are bothersome): -bone pain -complete hair loss including hair on your head, underarms, pubic hair, eyebrows, and eyelashes -diarrhea -excessive tearing -changes in the color of fingernails -loosening of the fingernails -nausea -muscle pain -red flush to skin -sweating -weak or tired This list may not describe all possible side effects. Call your doctor for medical advice about side effects. You may report side effects to FDA at 1-800-FDA-1088. Where should I keep my medicine? This drug is given in a hospital or clinic and will not be stored at home. NOTE: This sheet is a summary. It may not cover all possible information. If you have questions about this medicine, talk to your doctor, pharmacist, or health care provider.  2018 Elsevier/Gold Standard (2015-02-19  12:32:56)   Carboplatin injection What is this medicine? CARBOPLATIN (KAR boe pla tin) is a chemotherapy drug. It targets fast dividing cells, like cancer cells, and causes these cells to die. This medicine is used to treat ovarian cancer and many other cancers. This medicine may be used for other purposes; ask your health care provider or pharmacist if you have questions. COMMON BRAND NAME(S): Paraplatin What should I tell my health care provider before I take this medicine? They need to know if you have any of these conditions: -blood disorders -hearing problems -kidney disease -recent or ongoing radiation therapy -an unusual or allergic reaction to carboplatin, cisplatin, other chemotherapy, other medicines, foods, dyes, or preservatives -pregnant or trying to get pregnant -breast-feeding How should I use this medicine? This drug is usually given as an infusion into a vein. It is administered in a hospital or clinic by a specially trained health care professional. Talk to your pediatrician regarding the use of this medicine in children. Special care may be needed. Overdosage: If you think you have taken too much of this medicine contact a poison control center or emergency room at once. NOTE: This medicine is only for you. Do not share this medicine with others. What if I miss a dose? It is important not to miss a dose. Call your doctor or health care professional if you are unable to keep an appointment. What may interact with this medicine? -medicines for seizures -medicines to increase blood counts like filgrastim, pegfilgrastim, sargramostim -some antibiotics like amikacin, gentamicin, neomycin, streptomycin, tobramycin -vaccines Talk to your doctor or health care professional before taking any of these medicines: -acetaminophen -aspirin -ibuprofen -ketoprofen -naproxen This list may not describe all possible interactions. Give your health care provider a list of all the  medicines, herbs, non-prescription drugs, or dietary supplements you use. Also tell them if you smoke, drink alcohol, or use illegal drugs. Some items may interact with your medicine. What should I watch for while using this medicine? Your condition will be monitored carefully while you are receiving this medicine. You will need important blood work done while you are taking this medicine. This drug may make you feel generally unwell. This is not uncommon, as chemotherapy can affect healthy cells as well as cancer cells. Report any side effects. Continue your course of treatment even though you feel ill unless your doctor tells you to stop. In some cases, you may be given additional medicines to help with side effects. Follow all directions for their use. Call your doctor or health care professional for advice if you get a fever, chills or sore throat, or other symptoms of a cold or flu. Do not treat yourself. This drug decreases your body's ability to fight infections. Try to avoid being around  people who are sick. This medicine may increase your risk to bruise or bleed. Call your doctor or health care professional if you notice any unusual bleeding. Be careful brushing and flossing your teeth or using a toothpick because you may get an infection or bleed more easily. If you have any dental work done, tell your dentist you are receiving this medicine. Avoid taking products that contain aspirin, acetaminophen, ibuprofen, naproxen, or ketoprofen unless instructed by your doctor. These medicines may hide a fever. Do not become pregnant while taking this medicine. Women should inform their doctor if they wish to become pregnant or think they might be pregnant. There is a potential for serious side effects to an unborn child. Talk to your health care professional or pharmacist for more information. Do not breast-feed an infant while taking this medicine. What side effects may I notice from receiving this  medicine? Side effects that you should report to your doctor or health care professional as soon as possible: -allergic reactions like skin rash, itching or hives, swelling of the face, lips, or tongue -signs of infection - fever or chills, cough, sore throat, pain or difficulty passing urine -signs of decreased platelets or bleeding - bruising, pinpoint red spots on the skin, black, tarry stools, nosebleeds -signs of decreased red blood cells - unusually weak or tired, fainting spells, lightheadedness -breathing problems -changes in hearing -changes in vision -chest pain -high blood pressure -low blood counts - This drug may decrease the number of white blood cells, red blood cells and platelets. You may be at increased risk for infections and bleeding. -nausea and vomiting -pain, swelling, redness or irritation at the injection site -pain, tingling, numbness in the hands or feet -problems with balance, talking, walking -trouble passing urine or change in the amount of urine Side effects that usually do not require medical attention (report to your doctor or health care professional if they continue or are bothersome): -hair loss -loss of appetite -metallic taste in the mouth or changes in taste This list may not describe all possible side effects. Call your doctor for medical advice about side effects. You may report side effects to FDA at 1-800-FDA-1088. Where should I keep my medicine? This drug is given in a hospital or clinic and will not be stored at home. NOTE: This sheet is a summary. It may not cover all possible information. If you have questions about this medicine, talk to your doctor, pharmacist, or health care provider.  2018 Else[employer]/Gold Standard (2007-04-24 14:38:05)

## 2016-11-30 ENCOUNTER — Other Ambulatory Visit: Payer: Self-pay

## 2016-12-02 ENCOUNTER — Other Ambulatory Visit: Payer: Self-pay

## 2016-12-02 ENCOUNTER — Other Ambulatory Visit: Payer: Self-pay | Admitting: Hematology and Oncology

## 2016-12-02 DIAGNOSIS — Z171 Estrogen receptor negative status [ER-]: Secondary | ICD-10-CM

## 2016-12-02 DIAGNOSIS — C50512 Malignant neoplasm of lower-outer quadrant of left female breast: Secondary | ICD-10-CM

## 2016-12-02 MED ORDER — POTASSIUM CHLORIDE CRYS ER 20 MEQ PO TBCR: 20.0000 meq | EXTENDED_RELEASE_TABLET | Freq: Every day | ORAL | 1 refills | Status: DC

## 2016-12-05 ENCOUNTER — Other Ambulatory Visit: Payer: Self-pay

## 2016-12-05 DIAGNOSIS — C50512 Malignant neoplasm of lower-outer quadrant of left female breast: Secondary | ICD-10-CM

## 2016-12-05 DIAGNOSIS — Z171 Estrogen receptor negative status [ER-]: Secondary | ICD-10-CM

## 2016-12-05 MED ORDER — ONDANSETRON HCL 8 MG PO TABS: 8.0000 mg | ORAL_TABLET | Freq: Two times a day (BID) | ORAL | 1 refills | Status: DC | PRN

## 2016-12-05 MED ORDER — PROCHLORPERAZINE MALEATE 10 MG PO TABS: 10.0000 mg | ORAL_TABLET | Freq: Four times a day (QID) | ORAL | 1 refills | Status: DC | PRN

## 2016-12-05 NOTE — Telephone Encounter (Signed)
Compazine and Zofran refilled.

## 2016-12-06 ENCOUNTER — Encounter: Payer: Self-pay | Admitting: Genetic Counselor

## 2016-12-06 ENCOUNTER — Ambulatory Visit: Payer: Self-pay | Admitting: Genetic Counselor

## 2016-12-06 ENCOUNTER — Telehealth: Payer: Self-pay | Admitting: Genetic Counselor

## 2016-12-06 DIAGNOSIS — Z1379 Encounter for other screening for genetic and chromosomal anomalies: Secondary | ICD-10-CM

## 2016-12-06 DIAGNOSIS — Z171 Estrogen receptor negative status [ER-]: Secondary | ICD-10-CM

## 2016-12-06 DIAGNOSIS — Z803 Family history of malignant neoplasm of breast: Secondary | ICD-10-CM

## 2016-12-06 DIAGNOSIS — C50512 Malignant neoplasm of lower-outer quadrant of left female breast: Secondary | ICD-10-CM

## 2016-12-06 DIAGNOSIS — Z8042 Family history of malignant neoplasm of prostate: Secondary | ICD-10-CM

## 2016-12-06 NOTE — Telephone Encounter (Signed)
Revealed negative genetic testing.  Discussed that we do not know why she has breast cancer or why there is cancer in the family. It could be due to a different gene that we are not testing, or maybe our current technology may not be able to pick something up.  It will be important for her to keep in contact with genetics to keep up with whether additional testing may be needed. 

## 2016-12-06 NOTE — Progress Notes (Signed)
HPI: Ms. Schwartzkopf was previously seen in the [facility] due to a personal and family history of cancer and concerns regarding a hereditary predisposition to cancer. Please refer to our prior cancer genetics clinic note for more information regarding Ms. Chihuahua's medical, social and family histories, and our assessment and recommendations, at the time. Ms. Jemmott recent genetic test results were disclosed to her, as were recommendations warranted by these results. These results and recommendations are discussed in more detail below.  CANCER HISTORY:    Malignant neoplasm of lower-outer quadrant of left breast of female, estrogen receptor negative ([place])   10/20/2016 Mammogram    Mammogram and ultrasound of the left breast revealed 1.7 cm mass at 4:00 position, 6:30 position 5 x 4 x 4 mm mass, 6:00 position 5 cm nipple 7 x 6 x 11 mm, left axillary lymph node with thickened cortex, T1c N1 stage II a AJCC 8      10/24/2016 Initial Diagnosis    Left breast biopsy 6:30 position 3 cm from nipple: IDC grade 2, DCIS, ER 0%, PR 0%, Ki-67 15% HER-2 positive ratio 2.1; 4:00 position 3 cm from nipple: IDC grade 2, DCIS, ER 0%, PR 0%, Ki-67 35%, HER-2 positive ratio 2.02; left axillary lymph node biopsy positive      11/04/2016 -  Neo-Adjuvant Chemotherapy    TCH Perjeta 6 cycles followed by Herceptin +/- Perjeta maintenance      11/30/2016 Genetic Testing    Negative genetic testing on the common hereditary cancer panel.  The Hereditary Gene Panel offered by [facility] includes sequencing and/or deletion duplication testing of the following 47 genes: APC, ATM, AXIN2, BARD1, BMPR1A, BRCA1, BRCA2, BRIP1, CDH1, CDK4, CDKN2A (p14ARF), CDKN2A (p16INK4a), CHEK2, CTNNA1, DICER1, EPCAM (Deletion/duplication testing only), GREM1 (promoter region deletion/duplication testing only), KIT, MEN1, MLH1, MSH2, MSH3, MSH6, MUTYH, NBN, NF1, NHTL1, PALB2, PDGFRA, PMS2, POLD1, POLE, PTEN, RAD50, RAD51C,  RAD51D, SDHB, SDHC, SDHD, SMAD4, SMARCA4. STK11, TP53, TSC1, TSC2, and VHL.  The following genes were evaluated for sequence changes only: SDHA and HOXB13 c.251G>A variant only. The report date is November 30, 2016.        FAMILY HISTORY:  We obtained a detailed, 4-generation family history.  Significant diagnoses are listed below: Family History  Problem Relation Age of Onset  . Breast cancer Mother 59       triple negative  . Leukemia Father   . Lung cancer Father   . Heart attack Maternal Uncle   . Prostate cancer Paternal Uncle   . COPD Paternal Grandmother   . Heart disease Paternal Grandfather   . Prostate cancer Paternal Uncle   . Leukemia Cousin     The patient has two sons and a daughter who are cancer free.  She does not have full siblings, but has two paternal half brothers and a paternal half sister.  Her siblings are cancer free.  Both parents are deceased.  The patient's mother was diagnosed with triple negative breast cancer at age 62 and died at 25. She reportedly was tested for BRCA mutations around 2011 and was negative. She has one brother who died of a heart attack at 53.  Both maternal grandparents are deceased and died in their 9's-90's from non cancer related issues.  The patient's father was diagnosed with leukemia, and ultimately lung cancer and died at 2.  He had three brothers.  One brother died of prostate cancer and another brother currently has prostate cancer.  The paternal grandparents are  deceased from non cancer related issues.  Patient's maternal ancestors are of [place] descent, and paternal ancestors are of [place] and [place] descent. There is no reported Ashkenazi Jewish ancestry. There is no known consanguinity.  GENETIC TEST RESULTS: Genetic testing reported out on November 30, 2016 through the common hereditary cancer panel found no deleterious mutations.  The Hereditary Gene Panel offered by [facility] includes sequencing and/or deletion  duplication testing of the following 47 genes: APC, ATM, AXIN2, BARD1, BMPR1A, BRCA1, BRCA2, BRIP1, CDH1, CDK4, CDKN2A (p14ARF), CDKN2A (p16INK4a), CHEK2, CTNNA1, DICER1, EPCAM (Deletion/duplication testing only), GREM1 (promoter region deletion/duplication testing only), KIT, MEN1, MLH1, MSH2, MSH3, MSH6, MUTYH, NBN, NF1, NHTL1, PALB2, PDGFRA, PMS2, POLD1, POLE, PTEN, RAD50, RAD51C, RAD51D, SDHB, SDHC, SDHD, SMAD4, SMARCA4. STK11, TP53, TSC1, TSC2, and VHL.  The following genes were evaluated for sequence changes only: SDHA and HOXB13 c.251G>A variant only.  The test report has been scanned into EPIC and is located under the Molecular Pathology section of the Results Review tab.   We discussed with Ms. Sambrano that since the current genetic testing is not perfect, it is possible there may be a gene mutation in one of these genes that current testing cannot detect, but that chance is small. We also discussed, that it is possible that another gene that has not yet been discovered, or that we have not yet tested, is responsible for the cancer diagnoses in the family, and it is, therefore, important to remain in touch with cancer genetics in the future so that we can continue to offer Ms. Plaza the most up to date genetic testing.     CANCER SCREENING RECOMMENDATIONS: This result is reassuring and indicates that Ms.  likely does not have an increased risk for a future cancer due to a mutation in one of these genes. This normal test also suggests that Ms. Barber's cancer was most likely not due to an inherited predisposition associated with one of these genes.  Most cancers happen by chance and this negative test suggests that her cancer falls into this category.  We, therefore, recommended she continue to follow the cancer management and screening guidelines provided by her oncology and primary healthcare provider.   RECOMMENDATIONS FOR FAMILY MEMBERS: Women in this family might be at some increased  risk of developing cancer, over the general population risk, simply due to the family history of cancer. We recommended women in this family have a yearly mammogram beginning at age 55, or 54 years younger than the earliest onset of cancer, an annual clinical breast exam, and perform monthly breast self-exams. Women in this family should also have a gynecological exam as recommended by their primary provider. All family members should have a colonoscopy by age 20.  FOLLOW-UP: Lastly, we discussed with Ms. Heide that cancer genetics is a rapidly advancing field and it is possible that new genetic tests will be appropriate for her and/or her family members in the future. We encouraged her to remain in contact with cancer genetics on an annual basis so we can update her personal and family histories and let her know of advances in cancer genetics that may benefit this family.   Our contact number was provided. Ms. Diggs questions were answered to her satisfaction, and she knows she is welcome to call us at anytime with additional questions or concerns.   Roma Kayser, MS, [facility] Certified Genetic Counselor [email or web address]

## 2016-12-09 ENCOUNTER — Other Ambulatory Visit: Payer: Self-pay | Admitting: *Deleted

## 2016-12-09 ENCOUNTER — Ambulatory Visit (HOSPITAL_BASED_OUTPATIENT_CLINIC_OR_DEPARTMENT_OTHER): Payer: 59

## 2016-12-09 ENCOUNTER — Ambulatory Visit (HOSPITAL_BASED_OUTPATIENT_CLINIC_OR_DEPARTMENT_OTHER): Payer: 59 | Admitting: Medical

## 2016-12-09 ENCOUNTER — Telehealth: Payer: Self-pay | Admitting: *Deleted

## 2016-12-09 VITALS — BP 116/73 | HR 71 | Temp 98.3°F | Resp 18 | Ht 67.0 in | Wt 143.0 lb

## 2016-12-09 DIAGNOSIS — C50912 Malignant neoplasm of unspecified site of left female breast: Secondary | ICD-10-CM

## 2016-12-09 DIAGNOSIS — J069 Acute upper respiratory infection, unspecified: Secondary | ICD-10-CM | POA: Diagnosis not present

## 2016-12-09 DIAGNOSIS — Z171 Estrogen receptor negative status [ER-]: Secondary | ICD-10-CM

## 2016-12-09 DIAGNOSIS — C50512 Malignant neoplasm of lower-outer quadrant of left female breast: Secondary | ICD-10-CM

## 2016-12-09 LAB — COMPREHENSIVE METABOLIC PANEL
ALT: 30 U/L (ref 0–55)
AST: 25 U/L (ref 5–34)
Albumin: 3.3 g/dL — ABNORMAL LOW (ref 3.5–5.0)
Alkaline Phosphatase: 108 U/L (ref 40–150)
Anion Gap: 6 mEq/L (ref 3–11)
BUN: 11.6 mg/dL (ref 7.0–26.0)
CO2: 28 mEq/L (ref 22–29)
Calcium: 8.6 mg/dL (ref 8.4–10.4)
Chloride: 105 mEq/L (ref 98–109)
Creatinine: 0.9 mg/dL (ref 0.6–1.1)
EGFR: >60 mL/min/{1.73_m2} (ref >60)
Glucose: 78 mg/dl (ref 70–140)
Potassium: 4.1 mEq/L (ref 3.5–5.1)
Sodium: 139 mEq/L (ref 136–145)
Total Bilirubin: 0.39 mg/dL (ref 0.20–1.20)
Total Protein: 6.6 g/dL (ref 6.4–8.3)

## 2016-12-09 LAB — CBC WITH DIFFERENTIAL/PLATELET
BASO%: 0.4 % (ref 0.0–2.0)
Basophils Absolute: 0 10*3/uL (ref 0.0–0.1)
EOS%: 0.1 % (ref 0.0–7.0)
Eosinophils Absolute: 0 10*3/uL (ref 0.0–0.5)
HCT: 32.5 % — ABNORMAL LOW (ref 34.8–46.6)
HGB: 10.8 g/dL — ABNORMAL LOW (ref 11.6–15.9)
LYMPH%: 19.4 % (ref 14.0–49.7)
MCH: 30.5 pg (ref 25.1–34.0)
MCHC: 33.2 g/dL (ref 31.5–36.0)
MCV: 92.1 fL (ref 79.5–101.0)
MONO#: 0.6 10*3/uL (ref 0.1–0.9)
MONO%: 6.3 % (ref 0.0–14.0)
NEUT#: 6.5 10*3/uL (ref 1.5–6.5)
NEUT%: 73.8 % (ref 38.4–76.8)
Platelets: 164 10*3/uL (ref 145–400)
RBC: 3.53 10*6/uL — ABNORMAL LOW (ref 3.70–5.45)
RDW: 14.3 % (ref 11.2–14.5)
WBC: 8.7 10*3/uL (ref 3.9–10.3)
lymph#: 1.7 10*3/uL (ref 0.9–3.3)

## 2016-12-09 MED ORDER — LORAZEPAM 0.5 MG PO TABS: 0.5000 mg | ORAL_TABLET | Freq: Every day | ORAL | 0 refills | Status: DC

## 2016-12-09 MED ORDER — DOXYCYCLINE HYCLATE 100 MG PO TABS: 100.0000 mg | ORAL_TABLET | Freq: Two times a day (BID) | ORAL | 0 refills | Status: DC

## 2016-12-09 NOTE — Progress Notes (Signed)
Symptoms Management Clinic Progress Note   Laura Anthony [id number] [birth year] 41 y.o.  Laura Anthony is managed by Dr. Nicholas Lose  Actively treated with chemotherapy: yes  Current Therapy: TCHP  Last Treated: 11/25/2016  Assessment: Plan:    Upper respiratory tract infection, unspecified type - Plan: doxycycline (VIBRA-TABS) 100 MG tablet  Malignant neoplasm of lower-outer quadrant of left breast of female, estrogen receptor negative ([place])   Upper respiratory tract infection with area of erythema noted superior and lateral to the patient's right chest wall port: Doxycycline 100 mg p.o. twice daily times 7 days.  ER negative malignant neoplasm of the lower outer quadrant of the left breast: The patient will return to clinic on 12/16/2016 for consideration of cycle 3 of [place] P chemotherapy.  Please see After Visit Summary for patient specific instructions.  Future Appointments  Date Time Provider [place]  12/16/2016 10:30 AM [facility] LAB 5 [facility] None  12/16/2016 10:45 AM [facility] J32 DNS [facility] None  12/16/2016 11:15 AM Nicholas Lose, MD [facility] None  12/16/2016 12:00 PM [facility] C10 [facility] None  01/06/2017 10:30 AM [facility] LAB 6 [facility] None  01/06/2017 10:45 AM [facility] J32 DNS [facility] None  01/06/2017 11:15 AM Nicholas Lose, MD [facility] None  01/06/2017 12:00 PM [facility] H31 [facility] None    No orders of the defined types were placed in this encounter.      Subjective:   Patient ID:  Laura Anthony is a 41 y.o. (DOB Nov 13, 1975) female.  Chief Complaint:  Chief Complaint  Patient presents with  . URI    HPI Laura Anthony is a 41 year old female with a history of an ER negative malignant neoplasm of the lower outer quadrant of the left breast who is status post cycle 2 of [place] P chemotherapy last dose on 11/25/2016.  She presents to the office today with a report of a sore throat, chills,   headache, yellow nasal secretions, mild ear pain and ringing, mild nausea, and slight redness at the lateral edge of her right chest wall port incision.  She has been pushing fluids.  She has been using Compazine and Zofran for her nausea which has helped.  She also reports that her husband has recently been ill with similar symptoms.  She denies fevers, diaphoresis, or vomiting.  She requests a refill of her Lorazepam.  Medications: I have reviewed the patient's current medications.  Allergies:  Allergies  Allergen Reactions  . Morphine And Related Shortness Of Breath and Itching    Past Medical History:  Diagnosis Date  . ADD (attention deficit disorder)   . Anxiety   . 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 Cancer ([place])    left breast  . DVT (deep venous thrombosis) ([place]) 2017   calf left - probably due to [facility] pills-  . Family history of breast cancer   . Family history of prostate cancer   . Headache    migraine  . History of seizure    x 1 as a child - was never on anticonvulsants  . Impingement syndrome of right shoulder 07/2013  . PONV (postoperative nausea and vomiting)   . Right bicipital tenosynovitis 07/2013  . Rotator cuff impingement syndrome of left shoulder 07/12/2013  . Seizures ([name])     1 random as a child    Past Surgical History:  Procedure Laterality Date  . ADENOIDECTOMY    . ANKLE ARTHROSCOPY Right   . KNEE ARTHROSCOPY Right   .  KNEE ARTHROSCOPY W/ ACL RECONSTRUCTION Left   . SEPTOPLASTY WITH ETHMOIDECTOMY, AND MAXILLARY ANTROSTOMY  10/29/2010   bilat. max. antrostomy with left max. stripping; left ant. ethmoidectomy; right total ethmoidectomy; sphenoidotomy    Family History  Problem Relation Age of Onset  . Breast cancer Mother 9       triple negative  . Leukemia Father   . Lung cancer Father   . Heart attack Maternal Uncle   . Prostate cancer Paternal Uncle   . COPD Paternal Grandmother   . Heart disease  Paternal Grandfather   . Prostate cancer Paternal Uncle   . Leukemia Cousin     Social History   Socioeconomic History  . Marital status: Married    Spouse name: Not on file  . Number of children: Not on file  . Years of education: Not on file  . Highest education level: Not on file  Social Needs  . Financial resource strain: Not on file  . Food insecurity - worry: Not on file  . Food insecurity - inability: Not on file  . Transportation needs - medical: Not on file  . Transportation needs - non-medical: Not on file  Occupational History  . Not on file  Tobacco Use  . Smoking status: Never Smoker  . Smokeless tobacco: Never Used  Substance and Sexual Activity  . Alcohol use: Yes    Comment: socially  . Drug use: No  . Sexual activity: Not on file  Other Topics Concern  . Not on file  Social History Narrative  . Not on file    Past Medical History, Surgical history, Social history, and Family history were reviewed and updated as appropriate.   Please see review of systems for further details on the patient's review from today.   Review of Systems:  Review of Systems  Constitutional: Positive for chills. Negative for diaphoresis and fever.  HENT: Positive for congestion and ear pain. Negative for postnasal drip, rhinorrhea and sinus pressure.   Respiratory: Positive for cough. Negative for shortness of breath.   Gastrointestinal: Positive for nausea. Negative for constipation, diarrhea and vomiting.  Skin: Positive for color change.  Neurological: Positive for headaches.    Objective:   Physical Exam:  BP 116/73 (BP Location: Right Arm, Patient Position: Sitting)   Pulse 71   Temp 98.3 F (36.8 C) (Oral)   Resp 18   Ht 5\' 7"  (1.702 m)   Wt 143 lb (64.9 kg)   SpO2 100%   BMI 22.40 kg/m  ECOG: 0   Physical Exam  Constitutional: No distress.  HENT:  Head: Normocephalic and atraumatic.  Ears:  Mouth/Throat: Posterior oropharyngeal erythema present. No  oropharyngeal exudate.    Eyes: Right eye exhibits no discharge. Left eye exhibits no discharge. No scleral icterus.  Cardiovascular: Normal rate, regular rhythm and normal heart sounds. Exam reveals no gallop and no friction rub.  No murmur heard. Pulmonary/Chest: Effort normal and breath sounds normal. No respiratory distress. She has no wheezes. She has no rales.  Neurological: She is alert. Coordination normal.  Skin: She is not diaphoretic.          Lab Review:     Component Value Date/Time   NA 139 12/09/2016 1249   K 4.1 12/09/2016 1249   CL 103 10/19/2010 2105   CO2 28 12/09/2016 1249   GLUCOSE 78 12/09/2016 1249   BUN 11.6 12/09/2016 1249   CREATININE 0.9 12/09/2016 1249   CALCIUM 8.6 12/09/2016 1249   PROT  6.6 12/09/2016 1249   ALBUMIN 3.3 (L) 12/09/2016 1249   AST 25 12/09/2016 1249   ALT 30 12/09/2016 1249   ALKPHOS 108 12/09/2016 1249   BILITOT 0.39 12/09/2016 1249       Component Value Date/Time   WBC 8.7 12/09/2016 1249   WBC 5.0 11/01/2016 0724   RBC 3.53 (L) 12/09/2016 1249   RBC 4.26 11/01/2016 0724   HGB 10.8 (L) 12/09/2016 1249   HCT 32.5 (L) 12/09/2016 1249   PLT 164 12/09/2016 1249   MCV 92.1 12/09/2016 1249   MCH 30.5 12/09/2016 1249   MCH 29.8 11/01/2016 0724   MCHC 33.2 12/09/2016 1249   MCHC 33.8 11/01/2016 0724   RDW 14.3 12/09/2016 1249   LYMPHSABS 1.7 12/09/2016 1249   MONOABS 0.6 12/09/2016 1249   EOSABS 0.0 12/09/2016 1249   BASOSABS 0.0 12/09/2016 1249   -------------------------------  Imaging from last 24 hours (if applicable):  Radiology interpretation: No results found.

## 2016-12-09 NOTE — Progress Notes (Signed)
Comes to clinic with c/o URI symptoms. Headache, nasal congestion with yellow secretions, dry cough, fatigue.

## 2016-12-09 NOTE — Telephone Encounter (Signed)
Pt called with c/o sore throat, cough, chills, thick yellow drainage when she "blow my nose". Pt has taken her temp and is afebrile. Pt to see [facility] at 1:00pm with lab prior. Pt aware of appt.

## 2016-12-14 ENCOUNTER — Other Ambulatory Visit: Payer: Self-pay | Admitting: Hematology and Oncology

## 2016-12-16 ENCOUNTER — Ambulatory Visit (HOSPITAL_BASED_OUTPATIENT_CLINIC_OR_DEPARTMENT_OTHER): Payer: 59 | Admitting: Hematology and Oncology

## 2016-12-16 ENCOUNTER — Ambulatory Visit (HOSPITAL_BASED_OUTPATIENT_CLINIC_OR_DEPARTMENT_OTHER): Payer: 59

## 2016-12-16 ENCOUNTER — Ambulatory Visit: Payer: 59

## 2016-12-16 ENCOUNTER — Other Ambulatory Visit (HOSPITAL_BASED_OUTPATIENT_CLINIC_OR_DEPARTMENT_OTHER): Payer: 59

## 2016-12-16 DIAGNOSIS — Z171 Estrogen receptor negative status [ER-]: Secondary | ICD-10-CM

## 2016-12-16 DIAGNOSIS — C773 Secondary and unspecified malignant neoplasm of axilla and upper limb lymph nodes: Secondary | ICD-10-CM

## 2016-12-16 DIAGNOSIS — Z95828 Presence of other vascular implants and grafts: Secondary | ICD-10-CM

## 2016-12-16 DIAGNOSIS — Z5189 Encounter for other specified aftercare: Secondary | ICD-10-CM

## 2016-12-16 DIAGNOSIS — Z5112 Encounter for antineoplastic immunotherapy: Secondary | ICD-10-CM

## 2016-12-16 DIAGNOSIS — Z5111 Encounter for antineoplastic chemotherapy: Secondary | ICD-10-CM

## 2016-12-16 DIAGNOSIS — R5383 Other fatigue: Secondary | ICD-10-CM | POA: Diagnosis not present

## 2016-12-16 DIAGNOSIS — C50512 Malignant neoplasm of lower-outer quadrant of left female breast: Secondary | ICD-10-CM | POA: Diagnosis not present

## 2016-12-16 LAB — COMPREHENSIVE METABOLIC PANEL
ALT: 25 U/L (ref 0–55)
AST: 25 U/L (ref 5–34)
Albumin: 3.4 g/dL — ABNORMAL LOW (ref 3.5–5.0)
Alkaline Phosphatase: 79 U/L (ref 40–150)
Anion Gap: 6 mEq/L (ref 3–11)
BUN: 12.8 mg/dL (ref 7.0–26.0)
CO2: 30 mEq/L — ABNORMAL HIGH (ref 22–29)
Calcium: 9 mg/dL (ref 8.4–10.4)
Chloride: 105 mEq/L (ref 98–109)
Creatinine: 0.8 mg/dL (ref 0.6–1.1)
EGFR: >60 mL/min/{1.73_m2} (ref >60)
Glucose: 60 mg/dl — ABNORMAL LOW (ref 70–140)
Potassium: 4.1 mEq/L (ref 3.5–5.1)
Sodium: 140 mEq/L (ref 136–145)
Total Bilirubin: 0.36 mg/dL (ref 0.20–1.20)
Total Protein: 6.6 g/dL (ref 6.4–8.3)

## 2016-12-16 LAB — CBC WITH DIFFERENTIAL/PLATELET
BASO%: 0.8 % (ref 0.0–2.0)
Basophils Absolute: 0 10*3/uL (ref 0.0–0.1)
EOS%: 0.3 % (ref 0.0–7.0)
Eosinophils Absolute: 0 10*3/uL (ref 0.0–0.5)
HCT: 33.4 % — ABNORMAL LOW (ref 34.8–46.6)
HGB: 11 g/dL — ABNORMAL LOW (ref 11.6–15.9)
LYMPH%: 37.9 % (ref 14.0–49.7)
MCH: 30.9 pg (ref 25.1–34.0)
MCHC: 32.9 g/dL (ref 31.5–36.0)
MCV: 93.8 fL (ref 79.5–101.0)
MONO#: 0.5 10*3/uL (ref 0.1–0.9)
MONO%: 13.1 % (ref 0.0–14.0)
NEUT#: 1.9 10*3/uL (ref 1.5–6.5)
NEUT%: 47.9 % (ref 38.4–76.8)
Platelets: 185 10*3/uL (ref 145–400)
RBC: 3.56 10*6/uL — ABNORMAL LOW (ref 3.70–5.45)
RDW: 14.7 % — ABNORMAL HIGH (ref 11.2–14.5)
WBC: 4 10*3/uL (ref 3.9–10.3)
lymph#: 1.5 10*3/uL (ref 0.9–3.3)

## 2016-12-16 MED ORDER — SODIUM CHLORIDE 0.9 % IV SOLN
578.0000 mg | Freq: Once | INTRAVENOUS | Status: AC
  Administered 2016-12-16: 580 mg via INTRAVENOUS
  Filled 2016-12-16: qty 58

## 2016-12-16 MED ORDER — SODIUM CHLORIDE 0.9% FLUSH
10.0000 mL | INTRAVENOUS | Status: DC | PRN
  Administered 2016-12-16: 10 mL
  Filled 2016-12-16: qty 10

## 2016-12-16 MED ORDER — TRASTUZUMAB CHEMO 150 MG IV SOLR
6.0000 mg/kg | Freq: Once | INTRAVENOUS | Status: AC
  Administered 2016-12-16: 378 mg via INTRAVENOUS
  Filled 2016-12-16: qty 18

## 2016-12-16 MED ORDER — SODIUM CHLORIDE 0.9% FLUSH
10.0000 mL | INTRAVENOUS | Status: DC | PRN
  Administered 2016-12-16: 10 mL via INTRAVENOUS
  Filled 2016-12-16: qty 10

## 2016-12-16 MED ORDER — DIPHENHYDRAMINE HCL 25 MG PO CAPS
50.0000 mg | ORAL_CAPSULE | Freq: Once | ORAL | Status: AC
  Administered 2016-12-16: 50 mg via ORAL

## 2016-12-16 MED ORDER — PEGFILGRASTIM 6 MG/0.6ML ~~LOC~~ PSKT
6.0000 mg | PREFILLED_SYRINGE | Freq: Once | SUBCUTANEOUS | Status: AC
  Administered 2016-12-16: 6 mg via SUBCUTANEOUS
  Filled 2016-12-16: qty 0.6

## 2016-12-16 MED ORDER — SODIUM CHLORIDE 0.9 % IV SOLN
Freq: Once | INTRAVENOUS | Status: AC
  Administered 2016-12-16: 12:00:00 via INTRAVENOUS

## 2016-12-16 MED ORDER — DEXAMETHASONE SODIUM PHOSPHATE 10 MG/ML IJ SOLN: 10.0000 mg | Freq: Once | INTRAMUSCULAR | Status: DC

## 2016-12-16 MED ORDER — SODIUM CHLORIDE 0.9 % IV SOLN
420.0000 mg | Freq: Once | INTRAVENOUS | Status: AC
  Administered 2016-12-16: 420 mg via INTRAVENOUS
  Filled 2016-12-16: qty 14

## 2016-12-16 MED ORDER — HEPARIN SOD (PORK) LOCK FLUSH 100 UNIT/ML IV SOLN
500.0000 [IU] | Freq: Once | INTRAVENOUS | Status: AC | PRN
  Administered 2016-12-16: 500 [IU]
  Filled 2016-12-16: qty 5

## 2016-12-16 MED ORDER — PALONOSETRON HCL INJECTION 0.25 MG/5ML
0.2500 mg | Freq: Once | INTRAVENOUS | Status: AC
  Administered 2016-12-16: 0.25 mg via INTRAVENOUS

## 2016-12-16 MED ORDER — ACETAMINOPHEN 325 MG PO TABS
ORAL_TABLET | ORAL | Status: AC
  Filled 2016-12-16: qty 2

## 2016-12-16 MED ORDER — ACETAMINOPHEN 325 MG PO TABS
650.0000 mg | ORAL_TABLET | Freq: Once | ORAL | Status: AC
  Administered 2016-12-16: 650 mg via ORAL

## 2016-12-16 MED ORDER — DEXAMETHASONE SODIUM PHOSPHATE 10 MG/ML IJ SOLN
INTRAMUSCULAR | Status: AC
  Filled 2016-12-16: qty 1

## 2016-12-16 MED ORDER — DOCETAXEL CHEMO INJECTION 160 MG/16ML
65.0000 mg/m2 | Freq: Once | INTRAVENOUS | Status: AC
  Administered 2016-12-16: 110 mg via INTRAVENOUS
  Filled 2016-12-16: qty 11

## 2016-12-16 MED ORDER — DIPHENHYDRAMINE HCL 25 MG PO CAPS
ORAL_CAPSULE | ORAL | Status: AC
  Filled 2016-12-16: qty 2

## 2016-12-16 MED ORDER — PALONOSETRON HCL INJECTION 0.25 MG/5ML
INTRAVENOUS | Status: AC
  Filled 2016-12-16: qty 5

## 2016-12-16 NOTE — Progress Notes (Signed)
Patient Care Team: [name], MD as PCP - General (Family Medicine)  DIAGNOSIS:  Encounter Diagnosis  Name Primary?  . Malignant neoplasm of lower-outer quadrant of left breast of female, estrogen receptor negative ([place])     SUMMARY OF ONCOLOGIC HISTORY:   Malignant neoplasm of lower-outer quadrant of left breast of female, estrogen receptor negative ([place])   10/20/2016 Mammogram    Mammogram and ultrasound of the left breast revealed 1.7 cm mass at 4:00 position, 6:30 position 5 x 4 x 4 mm mass, 6:00 position 5 cm nipple 7 x 6 x 11 mm, left axillary lymph node with thickened cortex, T1c N1 stage II a AJCC 8      10/24/2016 Initial Diagnosis    Left breast biopsy 6:30 position 3 cm from nipple: IDC grade 2, DCIS, ER 0%, PR 0%, Ki-67 15% HER-2 positive ratio 2.1; 4:00 position 3 cm from nipple: IDC grade 2, DCIS, ER 0%, PR 0%, Ki-67 35%, HER-2 positive ratio 2.02; left axillary lymph node biopsy positive      11/04/2016 -  Neo-Adjuvant Chemotherapy    TCH Perjeta 6 cycles followed by Herceptin +/- Perjeta maintenance      11/30/2016 Genetic Testing    Negative genetic testing on the common hereditary cancer panel.  The Hereditary Gene Panel offered by [facility] includes sequencing and/or deletion duplication testing of the following 47 genes: APC, ATM, AXIN2, BARD1, BMPR1A, BRCA1, BRCA2, BRIP1, CDH1, CDK4, CDKN2A (p14ARF), CDKN2A (p16INK4a), CHEK2, CTNNA1, DICER1, EPCAM (Deletion/duplication testing only), GREM1 (promoter region deletion/duplication testing only), KIT, MEN1, MLH1, MSH2, MSH3, MSH6, MUTYH, NBN, NF1, NHTL1, PALB2, PDGFRA, PMS2, POLD1, POLE, PTEN, RAD50, RAD51C, RAD51D, SDHB, SDHC, SDHD, SMAD4, SMARCA4. STK11, TP53, TSC1, TSC2, and VHL.  The following genes were evaluated for sequence changes only: SDHA and HOXB13 c.251G>A variant only. The report date is November 30, 2016.        CHIEF COMPLIANT: Cycle 3 TCH Perjeta  INTERVAL HISTORY: Laura Anthony is a  41 year old with above-mentioned history of left breast cancer currently on neoadjuvant chemotherapy with TCH Perjeta.  She appears to be having more nausea and abdominal discomfort and distress.  She has not been eating or drinking a whole lot.  Previously she had felt better to IV fluids.  She would like to receive fluids sometime next week.  REVIEW OF SYSTEMS:   Constitutional: Denies fevers, chills or abnormal weight loss Eyes: Denies blurriness of vision Ears, nose, mouth, throat, and face: Denies mucositis or sore throat Respiratory: Denies cough, dyspnea or wheezes Cardiovascular: Denies palpitation, chest discomfort Gastrointestinal: Positive for nausea and vomiting Skin: Denies abnormal skin rashes Lymphatics: Denies new lymphadenopathy or easy bruising Neurological:Denies numbness, tingling or new weaknesses Behavioral/Psych: Mood is stable, no new changes  Extremities: No lower extremity edema  All other systems were reviewed with the patient and are negative.  I have reviewed the past medical history, past surgical history, social history and family history with the patient and they are unchanged from previous note.  ALLERGIES:  is allergic to morphine and related.  MEDICATIONS:  Current Outpatient Medications  Medication Sig Dispense Refill  . ALPRAZolam (XANAX) 0.5 MG tablet Take 0.5 mg by mouth at bedtime as needed for anxiety.    Marland Kitchen amphetamine-dextroamphetamine (ADDERALL) 20 MG tablet Take 20 mg by mouth daily as needed.    . busPIRone (BUSPAR) 5 MG tablet Take 5 mg by mouth daily as needed.    Marland Kitchen dexamethasone (DECADRON) 4 MG tablet Take 1 tablet (4 mg  total) by mouth daily. Date 1 tablet daily before chemotherapy and 1 tablet the day after (Patient not taking: Reported on 11/25/2016) 12 tablet 0  . doxycycline (VIBRA-TABS) 100 MG tablet Take 1 tablet (100 mg total) 2 (two) times daily by mouth. 14 tablet 0  . FLUoxetine (PROZAC) 20 MG capsule Take 20 mg by mouth every  morning.    . lidocaine-prilocaine (EMLA) cream Apply to affected area once 30 g 3  . lisdexamfetamine (VYVANSE) 40 MG capsule Take 40 mg by mouth every morning.     . loperamide (IMODIUM) 2 MG capsule Take 2 mg by mouth as needed for diarrhea or loose stools.    Marland Kitchen loratadine (CLARITIN) 10 MG tablet Take 10 mg by mouth daily as needed for allergies.    Marland Kitchen LORazepam (ATIVAN) 0.5 MG tablet Take 1 tablet (0.5 mg total) at bedtime by mouth. As needed for sleep 30 tablet 0  . Nutritional Supplements (JUICE PLUS FIBRE PO) Take 2 each by mouth daily. [employer]    . Nutritional Supplements (JUICE PLUS FIBRE PO) Take 2 each by mouth daily. [employer]    . Nutritional Supplements (JUICE PLUS FIBRE PO) Take 2 each by mouth daily. [employer]    . Nutritional Supplements (JUICE PLUS FIBRE PO) Take 2 each by mouth daily. [employer]    . ondansetron (ZOFRAN) 8 MG tablet Take 1 tablet (8 mg total) 2 (two) times daily as needed by mouth for refractory nausea / vomiting. Start on day 3 after chemo. 30 tablet 1  . potassium chloride SA (K-DUR,KLOR-CON) 20 MEQ tablet Take 1 tablet (20 mEq total) by mouth daily. (Patient not taking: Reported on 12/09/2016) 30 tablet 1  . prochlorperazine (COMPAZINE) 10 MG tablet Take 1 tablet (10 mg total) every 6 (six) hours as needed by mouth (Nausea or vomiting). 30 tablet 1  . rizatriptan (MAXALT) 10 MG tablet Take 10 mg by mouth as needed for migraine. May repeat in 2 hours if needed    . zolpidem (AMBIEN) 10 MG tablet Take 10 mg by mouth at bedtime as needed for sleep.     No current facility-administered medications for this visit.    Facility-Administered Medications Ordered in Other Visits  Medication Dose Route Frequency Provider Last Rate Last Dose  . CARBOplatin (PARAPLATIN) 580 mg in sodium chloride 0.9 % 250 mL chemo infusion  580 mg Intravenous Once Nicholas Lose, MD      . dexamethasone (DECADRON) injection 10 mg  10 mg Intravenous Once Nicholas Lose, MD       . DOCEtaxel (TAXOTERE) 110 mg in dextrose 5 % 250 mL chemo infusion  65 mg/m2 (Treatment Plan Recorded) Intravenous Once Nicholas Lose, MD 261 mL/hr at 12/16/16 1533 110 mg at 12/16/16 1533  . pegfilgrastim (NEULASTA ONPRO KIT) injection 6 mg  6 mg Subcutaneous Once Nicholas Lose, MD        PHYSICAL EXAMINATION: ECOG PERFORMANCE STATUS: 1 - Symptomatic but completely ambulatory  Vitals:   12/16/16 1114  BP: 115/76  Pulse: 61  Resp: 17  Temp: 98.2 F (36.8 C)  SpO2: 99%   Filed Weights   12/16/16 1114  Weight: 143 lb 1.6 oz (64.9 kg)    GENERAL:alert, no distress and comfortable SKIN: skin color, texture, turgor are normal, no rashes or significant lesions EYES: normal, Conjunctiva are pink and non-injected, sclera clear OROPHARYNX:no exudate, no erythema and lips, buccal mucosa, and tongue normal  NECK: supple, thyroid normal size, non-tender, without nodularity LYMPH:  no palpable lymphadenopathy in the cervical, axillary or inguinal LUNGS: clear to auscultation and percussion with normal breathing effort HEART: regular rate & rhythm and no murmurs and no lower extremity edema ABDOMEN:abdomen soft, non-tender and normal bowel sounds MUSCULOSKELETAL:no cyanosis of digits and no clubbing  NEURO: alert & oriented x 3 with fluent speech, no focal motor/sensory deficits EXTREMITIES: No lower extremity edema  LABORATORY DATA:  I have reviewed the data as listed   Chemistry      Component Value Date/Time   NA 140 12/16/2016 1028   K 4.1 12/16/2016 1028   CL 103 10/19/2010 2105   CO2 30 (H) 12/16/2016 1028   BUN 12.8 12/16/2016 1028   CREATININE 0.8 12/16/2016 1028      Component Value Date/Time   CALCIUM 9.0 12/16/2016 1028   ALKPHOS 79 12/16/2016 1028   AST 25 12/16/2016 1028   ALT 25 12/16/2016 1028   BILITOT 0.36 12/16/2016 1028       Lab Results  Component Value Date   WBC 4.0 12/16/2016   HGB 11.0 (L) 12/16/2016   HCT 33.4 (L) 12/16/2016   MCV 93.8  12/16/2016   PLT 185 12/16/2016   NEUTROABS 1.9 12/16/2016    ASSESSMENT & PLAN:  Malignant neoplasm of lower-outer quadrant of left breast of female, estrogen receptor negative ([place]) Mammogram and ultrasound of the left breast revealed 1.7 cm mass at 4:00 position, 6:30 position 5 x 4 x 4 mm mass, 6:00 position 5 cm nipple 7 x 6 x 11 mm, left axillary lymph node with thickened cortex, T1c N1 stage II a AJCC 8  10/24/2016: Left breast biopsy 6:30 position 3 cm from nipple: IDC grade 2, DCIS, ER 0%, PR 0%, 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 Neoadjuvant chemotherapy with TCHP This would be followed by Herceptin maintenance for 1 year. 2. followed by breast conserving surgery if it is feasible 3. Followed by adjuvant radiation ---------------------------------------------------------------------------- Current treatment: Cycle 3 day 1 TCH Perjeta Echocardiogram 11/03/2016: EF 55-60%  Chemotherapy toxicities: 1. Diarrhea: Stopped after taking imodium 2. Severe fatigue: slowly improving 3. Panic attacks: D/C oral and IV decadron 4. Bone pain from Neulasta 5. Headaches from zofran We will reduce the dosage of chemotherapy IV fluids to be scheduled for middle of next week Return to clinic in 3 weeks for cycle 4   I spent 25 minutes talking to the patient of which more than half was spent in counseling and coordination of care.  No orders of the defined types were placed in this encounter.  The patient has a good understanding of the overall plan. she agrees with it. she will call with any problems that may develop before the next visit here.   Rulon Eisenmenger, MD 12/16/16

## 2016-12-16 NOTE — Assessment & Plan Note (Signed)
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 Neoadjuvant chemotherapy with TCHP This would be followed by Herceptin maintenance for 1 year. 2. followed by breast conserving surgery if it is feasible 3. Followed by adjuvant radiation ---------------------------------------------------------------------------- Current treatment: Cycle 3 day 1 TCH Perjeta Echocardiogram 11/03/2016: EF 55-60%  Chemotherapy toxicities: 1. Diarrhea: Stopped after taking imodium 2. Severe fatigue: slowly improving 3. Panic attacks: D/C oral and IV decadron 4. Bone pain from Neulasta 5. Headaches from zofran We will reduce the dosage of chemotherapy IV fluids to be scheduled for middle of next week Return to clinic in 3 weeks for cycle 4

## 2016-12-16 NOTE — Patient Instructions (Addendum)
[facility] Discharge Instructions for Patients Receiving Chemotherapy  Today you received the following chemotherapy agents:  Herceptin, Perjeta, Taxotere, and Carboplatin.  To help prevent nausea and vomiting after your treatment, we encourage you to take your nausea medication as directed.   If you develop nausea and vomiting that is not controlled by your nausea medication, call the clinic.   BELOW ARE SYMPTOMS THAT SHOULD BE REPORTED IMMEDIATELY:  *FEVER GREATER THAN 100.5 F  *CHILLS WITH OR WITHOUT FEVER  NAUSEA AND VOMITING THAT IS NOT CONTROLLED WITH YOUR NAUSEA MEDICATION  *UNUSUAL SHORTNESS OF BREATH  *UNUSUAL BRUISING OR BLEEDING  TENDERNESS IN MOUTH AND THROAT WITH OR WITHOUT PRESENCE OF ULCERS  *URINARY PROBLEMS  *BOWEL PROBLEMS  UNUSUAL RASH Items with * indicate a potential emergency and should be followed up as soon as possible.  Feel free to call the clinic should you have any questions or concerns. The clinic phone number is [phone].  Please show the CHEMO ALERT CARD at check-in to the Emergency Department and triage nurse.   

## 2016-12-19 ENCOUNTER — Ambulatory Visit (HOSPITAL_BASED_OUTPATIENT_CLINIC_OR_DEPARTMENT_OTHER): Payer: 59

## 2016-12-19 ENCOUNTER — Telehealth: Payer: Self-pay

## 2016-12-19 ENCOUNTER — Encounter: Payer: Self-pay | Admitting: Pharmacist

## 2016-12-19 ENCOUNTER — Encounter: Payer: Self-pay | Admitting: Adult Health

## 2016-12-19 ENCOUNTER — Ambulatory Visit (HOSPITAL_BASED_OUTPATIENT_CLINIC_OR_DEPARTMENT_OTHER): Payer: 59 | Admitting: Adult Health

## 2016-12-19 VITALS — BP 116/81 | HR 76 | Resp 18

## 2016-12-19 VITALS — BP 122/74 | HR 79 | Temp 98.7°F | Resp 18 | Ht 67.0 in | Wt 141.5 lb

## 2016-12-19 DIAGNOSIS — C50512 Malignant neoplasm of lower-outer quadrant of left female breast: Secondary | ICD-10-CM

## 2016-12-19 DIAGNOSIS — R112 Nausea with vomiting, unspecified: Secondary | ICD-10-CM | POA: Diagnosis not present

## 2016-12-19 DIAGNOSIS — C773 Secondary and unspecified malignant neoplasm of axilla and upper limb lymph nodes: Secondary | ICD-10-CM

## 2016-12-19 DIAGNOSIS — Z95828 Presence of other vascular implants and grafts: Secondary | ICD-10-CM

## 2016-12-19 DIAGNOSIS — Z171 Estrogen receptor negative status [ER-]: Secondary | ICD-10-CM

## 2016-12-19 LAB — COMPREHENSIVE METABOLIC PANEL
ALT: 22 U/L (ref 0–55)
AST: 28 U/L (ref 5–34)
Albumin: 3.7 g/dL (ref 3.5–5.0)
Alkaline Phosphatase: 103 U/L (ref 40–150)
Anion Gap: 8 mEq/L (ref 3–11)
BUN: 11.7 mg/dL (ref 7.0–26.0)
CO2: 29 mEq/L (ref 22–29)
Calcium: 9 mg/dL (ref 8.4–10.4)
Chloride: 100 mEq/L (ref 98–109)
Creatinine: 0.8 mg/dL (ref 0.6–1.1)
EGFR: >60 mL/min/{1.73_m2} (ref >60)
Glucose: 99 mg/dl (ref 70–140)
Potassium: 3.8 mEq/L (ref 3.5–5.1)
Sodium: 137 mEq/L (ref 136–145)
Total Bilirubin: 0.66 mg/dL (ref 0.20–1.20)
Total Protein: 7.2 g/dL (ref 6.4–8.3)

## 2016-12-19 LAB — CBC WITH DIFFERENTIAL/PLATELET
BASO%: 0.3 % (ref 0.0–2.0)
Basophils Absolute: 0.1 10*3/uL (ref 0.0–0.1)
EOS%: 0.1 % (ref 0.0–7.0)
Eosinophils Absolute: 0 10*3/uL (ref 0.0–0.5)
HCT: 35.7 % (ref 34.8–46.6)
HGB: 11.8 g/dL (ref 11.6–15.9)
LYMPH%: 5.6 % — ABNORMAL LOW (ref 14.0–49.7)
MCH: 30.8 pg (ref 25.1–34.0)
MCHC: 33.1 g/dL (ref 31.5–36.0)
MCV: 92.9 fL (ref 79.5–101.0)
MONO#: 0.2 10*3/uL (ref 0.1–0.9)
MONO%: 0.9 % (ref 0.0–14.0)
NEUT#: 20.7 10*3/uL — ABNORMAL HIGH (ref 1.5–6.5)
NEUT%: 93.1 % — ABNORMAL HIGH (ref 38.4–76.8)
Platelets: 150 10*3/uL (ref 145–400)
RBC: 3.84 10*6/uL (ref 3.70–5.45)
RDW: 15.5 % — ABNORMAL HIGH (ref 11.2–14.5)
WBC: 22.2 10*3/uL — ABNORMAL HIGH (ref 3.9–10.3)
lymph#: 1.2 10*3/uL (ref 0.9–3.3)

## 2016-12-19 LAB — MAGNESIUM: Magnesium: 2.1 mg/dl (ref 1.5–2.5)

## 2016-12-19 MED ORDER — HEPARIN SOD (PORK) LOCK FLUSH 100 UNIT/ML IV SOLN
500.0000 [IU] | Freq: Once | INTRAVENOUS | Status: AC | PRN
  Administered 2016-12-19: 500 [IU] via INTRAVENOUS
  Filled 2016-12-19: qty 5

## 2016-12-19 MED ORDER — PROMETHAZINE HCL 25 MG PO TABS: 25.0000 mg | ORAL_TABLET | Freq: Four times a day (QID) | ORAL | 0 refills | Status: DC | PRN

## 2016-12-19 MED ORDER — PALONOSETRON HCL INJECTION 0.25 MG/5ML
0.2500 mg | Freq: Once | INTRAVENOUS | Status: AC
Start: 2016-12-19 — End: 2016-12-19
  Administered 2016-12-19: 0.25 mg via INTRAVENOUS

## 2016-12-19 MED ORDER — PALONOSETRON HCL INJECTION 0.25 MG/5ML
INTRAVENOUS | Status: AC
  Filled 2016-12-19: qty 5

## 2016-12-19 MED ORDER — PROCHLORPERAZINE EDISYLATE 5 MG/ML IJ SOLN
INTRAMUSCULAR | Status: AC
  Filled 2016-12-19: qty 2

## 2016-12-19 MED ORDER — PROCHLORPERAZINE EDISYLATE 5 MG/ML IJ SOLN
10.0000 mg | Freq: Once | INTRAMUSCULAR | Status: AC
  Administered 2016-12-19: 10 mg via INTRAVENOUS

## 2016-12-19 MED ORDER — SODIUM CHLORIDE 0.9% FLUSH
10.0000 mL | INTRAVENOUS | Status: DC | PRN
Start: 2016-12-19 — End: 2016-12-19
  Administered 2016-12-19: 10 mL via INTRAVENOUS
  Filled 2016-12-19: qty 10

## 2016-12-19 NOTE — Progress Notes (Signed)
Laura Anthony:    Laura Noon, MD [place] [id number]   DIAGNOSIS: Cancer Staging Malignant neoplasm of lower-outer quadrant of left breast of female, estrogen receptor negative ([place]) Staging form: Breast, AJCC 8th Edition - Clinical: Stage IIA (cT1c, cN1, cM0, G2, ER: Negative, PR: Negative, HER2: Positive) - Signed by Gardenia Phlegm, NP on 11/02/2016   SUMMARY OF ONCOLOGIC HISTORY:   Malignant neoplasm of lower-outer quadrant of left breast of female, estrogen receptor negative ([place])   10/20/2016 Mammogram    Mammogram and ultrasound of the left breast revealed 1.7 cm mass at 4:00 position, 6:30 position 5 x 4 x 4 mm mass, 6:00 position 5 cm nipple 7 x 6 x 11 mm, left axillary lymph node with thickened cortex, T1c N1 stage II a AJCC 8      10/24/2016 Initial Diagnosis    Left breast biopsy 6:30 position 3 cm from nipple: IDC grade 2, DCIS, ER 0%, PR 0%, Ki-67 15% HER-2 positive ratio 2.1; 4:00 position 3 cm from nipple: IDC grade 2, DCIS, ER 0%, PR 0%, Ki-67 35%, HER-2 positive ratio 2.02; left axillary lymph node biopsy positive      11/04/2016 -  Neo-Adjuvant Chemotherapy    TCH Perjeta 6 cycles followed by Herceptin +/- Perjeta maintenance      11/30/2016 Genetic Testing    Negative genetic testing on the common hereditary cancer panel.  The Hereditary Gene Panel offered by [facility] includes sequencing and/or deletion duplication testing of the following 47 genes: APC, ATM, AXIN2, BARD1, BMPR1A, BRCA1, BRCA2, BRIP1, CDH1, CDK4, CDKN2A (p14ARF), CDKN2A (p16INK4a), CHEK2, CTNNA1, DICER1, EPCAM (Deletion/duplication testing only), GREM1 (promoter region deletion/duplication testing only), KIT, MEN1, MLH1, MSH2, MSH3, MSH6, MUTYH, NBN, NF1, NHTL1, PALB2, PDGFRA, PMS2, POLD1, POLE, PTEN, RAD50, RAD51C, RAD51D, SDHB, SDHC, SDHD, SMAD4, SMARCA4. STK11, TP53, TSC1, TSC2, and VHL.  The following genes were evaluated for sequence  changes only: SDHA and HOXB13 c.251G>A variant only. The report date is November 30, 2016.        CURRENT THERAPY: [place] Perjeta cycle 3 day 4  INTERVAL HISTORY: Laura Anthony 41 y.o. female returns for evaluation of nausea/vomiting following chemotherapy.  The nausea has been present since the day of chemotherapy.  She says it became severe yesterday, followed by vomiting this morning.  She had nausea with cycle 2 and Dr. Lindi Adie dose reduced her.  She also had difficulty with the Dexamethasone during and following chemotherapy, she felt jittery, restless and unable to sleep.  With cycle 2 and 3 she didn't take any of the steroids.  She has taken Ondansetron and Compazine with minimal relief.     Patient Active Problem List   Diagnosis Date Noted  . Port-A-Cath in place 12/16/2016  . Genetic testing 12/06/2016  . Encounter for antineoplastic chemotherapy 11/25/2016  . Family history of breast cancer   . Family history of prostate cancer   . Malignant neoplasm of lower-outer quadrant of left breast of female, estrogen receptor negative ([place]) 10/27/2016  . Rotator cuff impingement syndrome of left shoulder 07/12/2013    is allergic to morphine and related.  MEDICAL HISTORY: Past Medical History:  Diagnosis Date  . ADD (attention deficit disorder)   . Anxiety   . 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 Cancer ([place])    left breast  . DVT (deep venous thrombosis) ([place]) 2017   calf left - probably due to [facility] pills-  .  Family history of breast cancer   . Family history of prostate cancer   . Headache    migraine  . History of seizure    x 1 as a child - was never on anticonvulsants  . Impingement syndrome of right shoulder 07/2013  . PONV (postoperative nausea and vomiting)   . Right bicipital tenosynovitis 07/2013  . Rotator cuff impingement syndrome of left shoulder 07/12/2013  . Seizures ([place])     1 random as a child    SURGICAL HISTORY: Past  Surgical History:  Procedure Laterality Date  . ADENOIDECTOMY    . ANKLE ARTHROSCOPY Right   . ARTHROSCOPY WRIST Right 01/17/2012   Performed by Tennis Must, MD at [facility]  . INSERTION PORT-A-CATH WITH [place] N/A 11/01/2016   Performed by Rolm Bookbinder, MD at [place]  . KNEE ARTHROSCOPY Right   . KNEE ARTHROSCOPY W/ ACL RECONSTRUCTION Left   . RIGHT SHOULDER ARTHROSCOPY DEBRIDEMENT EXTENSIVE DECOMPRESSION SUBACROMIAL PARTIAL ACROMIOPLASTY Right 07/12/2013   Performed by Johnny Bridge, MD at [facility]  . SEPTOPLASTY WITH ETHMOIDECTOMY, AND MAXILLARY ANTROSTOMY  10/29/2010   bilat. max. antrostomy with left max. stripping; left ant. ethmoidectomy; right total ethmoidectomy; sphenoidotomy    SOCIAL HISTORY: Social History   Socioeconomic History  . Marital status: Married    Spouse name: Not on file  . Number of children: Not on file  . Years of education: Not on file  . Highest education level: Not on file  Social Needs  . Financial resource strain: Not on file  . Food insecurity - worry: Not on file  . Food insecurity - inability: Not on file  . Transportation needs - medical: Not on file  . Transportation needs - non-medical: Not on file  Occupational History  . Not on file  Tobacco Use  . Smoking status: Never Smoker  . Smokeless tobacco: Never Used  Substance and Sexual Activity  . Alcohol use: Yes    Comment: socially  . Drug use: No  . Sexual activity: Not on file  Other Topics Concern  . Not on file  Social History Narrative  . Not on file    FAMILY HISTORY: Family History  Problem Relation Age of Onset  . Breast cancer Mother 22       triple negative  . Leukemia Father   . Lung cancer Father   . Heart attack Maternal Uncle   . Prostate cancer Paternal Uncle   . COPD Paternal Grandmother   . Heart disease Paternal Grandfather   . Prostate cancer Paternal Uncle   . Leukemia Cousin     Review of Systems   Constitutional: Positive for fatigue. Negative for appetite change, chills, diaphoresis and unexpected weight change.  HENT:   Negative for hearing loss and lump/mass.   Eyes: Negative for eye problems and icterus.  Respiratory: Negative for chest tightness, cough and shortness of breath.   Cardiovascular: Negative for chest pain, leg swelling and palpitations.  Gastrointestinal: Positive for nausea and vomiting. Negative for abdominal distention, abdominal pain, constipation and diarrhea.  Endocrine: Negative for hot flashes.  Skin: Negative for itching and rash.  Neurological: Negative for dizziness, extremity weakness, headaches and light-headedness.  Psychiatric/Behavioral: The patient is not nervous/anxious.       PHYSICAL EXAMINATION  ECOG PERFORMANCE STATUS: 2 - Symptomatic, <50% confined to bed  Vitals:   12/19/16 1410  BP: 122/74  Pulse: 79  Resp: 18  Temp: 98.7 F (37.1  C)  SpO2: 99%    Physical Exam  Constitutional: She is oriented to person, place, and time and well-developed, well-nourished, and in no distress.  HENT:  Head: Normocephalic and atraumatic.  Mouth/Throat: Oropharynx is clear and moist. No oropharyngeal exudate.  Eyes: Pupils are equal, round, and reactive to light. No scleral icterus.  Neck: Neck supple.  Cardiovascular: Normal rate, regular rhythm and normal heart sounds.  Pulmonary/Chest: Effort normal and breath sounds normal.  Abdominal: Soft. Bowel sounds are normal. She exhibits no distension and no mass. There is no tenderness. There is no rebound and no guarding.  Musculoskeletal: She exhibits no edema.  Lymphadenopathy:    She has no cervical adenopathy.  Neurological: She is alert and oriented to person, place, and time.  Skin: Skin is warm and dry. No rash noted.  Psychiatric: Mood and affect normal.    LABORATORY DATA:  CBC    Component Value Date/Time   WBC 22.2 (H) 12/19/2016 1341   WBC 5.0 11/01/2016 0724   RBC 3.84  12/19/2016 1341   RBC 4.26 11/01/2016 0724   HGB 11.8 12/19/2016 1341   HCT 35.7 12/19/2016 1341   PLT 150 12/19/2016 1341   MCV 92.9 12/19/2016 1341   MCH 30.8 12/19/2016 1341   MCH 29.8 11/01/2016 0724   MCHC 33.1 12/19/2016 1341   MCHC 33.8 11/01/2016 0724   RDW 15.5 (H) 12/19/2016 1341   LYMPHSABS 1.2 12/19/2016 1341   MONOABS 0.2 12/19/2016 1341   EOSABS 0.0 12/19/2016 1341   BASOSABS 0.1 12/19/2016 1341    CMP     Component Value Date/Time   NA 137 12/19/2016 1341   K 3.8 12/19/2016 1341   CL 103 10/19/2010 2105   CO2 29 12/19/2016 1341   GLUCOSE 99 12/19/2016 1341   BUN 11.7 12/19/2016 1341   CREATININE 0.8 12/19/2016 1341   CALCIUM 9.0 12/19/2016 1341   PROT 7.2 12/19/2016 1341   ALBUMIN 3.7 12/19/2016 1341   AST 28 12/19/2016 1341   ALT 22 12/19/2016 1341   ALKPHOS 103 12/19/2016 1341   BILITOT 0.66 12/19/2016 1341      ASSESSMENT and  PLAN:   Malignant neoplasm of lower-outer quadrant of left breast of female, estrogen receptor negative ([facility]) Mammogram and ultrasound of the left breast revealed 1.7 cm mass at 4:00 position, 6:30 position 5 x 4 x 4 mm mass, 6:00 position 5 cm nipple 7 x 6 x 11 mm, left axillary lymph node with thickened cortex, T1c N1 stage II a AJCC 8  10/24/2016: Left breast biopsy 6:30 position 3 cm from nipple: IDC grade 2, DCIS, ER 0%, PR 0%, 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 Neoadjuvant chemotherapy with TCHP This would be followed by Herceptin maintenance for 1 year. 2. followed by breast conserving surgery if it is feasible 3. Followed by adjuvant radiation ---------------------------------------------------------------------------- Current treatment: Cycle 3 day 4 [place] Perjeta Echocardiogram 11/03/2016: EF 55-60%  Xylina is experiencing significant nausea and  vomiting.  She will receive 1L iv fluids today in addition to Aloxi.  I wrote for a prescription of Phenergan for her to take if needed for nausea at home.  She knows about sleepiness possibility with phenergan and to be careful with alternating it with Compazine.  I reviewed her anti emetics with [place] in pharmacy and Fosaprepitant was added to  future chemotherapy plans.    Kymber will return on 12/7 for follow Anthony with Dr. Lindi Adie and cycle 4 of chemotherapy.  She knows to call us if her nausea isn't improving.    All questions were answered. The patient knows to call the clinic with any problems, questions or concerns. We can certainly see the patient much sooner if necessary.  A total of (30) minutes of face-to-face time was spent with this patient with greater than 50% of that time in counseling and care-coordination.  This note was electronically signed. Scot Dock, NP 12/19/2016

## 2016-12-19 NOTE — Telephone Encounter (Signed)
Pt called with severe nausea. Her last herceptin, perjeta, taxol, carboplatin was 11/16.  Constant dry heaves on Saturday and sunday. She did not take decadron d/t anxiety and tachycardia on prior cycles. zofran and compazine are not helping. She has been sipping on gingerale and water.  It would take her 30-40 minutes to get here.   S/w Dr Lindi Adie. Pt to get labs and see lindsey. Possible fluids. Lab at 2 lindsey at 230.

## 2016-12-19 NOTE — Assessment & Plan Note (Addendum)
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 Neoadjuvant chemotherapy with TCHP This would be followed by Herceptin maintenance for 1 year. 2. followed by breast conserving surgery if it is feasible 3. Followed by adjuvant radiation ---------------------------------------------------------------------------- Current treatment: Cycle 3 day 4 Laura Anthony Perjeta Echocardiogram 11/03/2016: EF 55-60%  Laura Anthony is experiencing significant nausea and vomiting.  She will receive 1L iv fluids today in addition to Aloxi.  I wrote for a prescription of Phenergan for her to take if needed for nausea at home.  She knows about sleepiness possibility with phenergan and to be careful with alternating it with Compazine.  I reviewed her anti emetics with Laura Anthony in pharmacy and Fosaprepitant was added to future chemotherapy plans.    Laura Anthony will return on 12/7 for follow up with Laura Anthony and cycle 4 of chemotherapy.  She knows to call us if her nausea isn't improving.

## 2016-12-20 ENCOUNTER — Telehealth: Payer: Self-pay | Admitting: Adult Health

## 2016-12-20 ENCOUNTER — Telehealth: Payer: Self-pay | Admitting: *Deleted

## 2016-12-20 NOTE — Telephone Encounter (Signed)
Per request of Wilber Bihari, NP called to check on patient to see if the nausea/vomiting had subsided any following the IV Fluids & Aloxi yesterday along with a Rx for Phenergan and compazine.    No answer on number, left message, requesting returned call to give [place] an update on her status.

## 2016-12-20 NOTE — Telephone Encounter (Signed)
No 11/19 los.

## 2017-01-06 ENCOUNTER — Ambulatory Visit (HOSPITAL_BASED_OUTPATIENT_CLINIC_OR_DEPARTMENT_OTHER): Payer: 59

## 2017-01-06 ENCOUNTER — Ambulatory Visit (HOSPITAL_BASED_OUTPATIENT_CLINIC_OR_DEPARTMENT_OTHER): Payer: 59 | Admitting: Hematology and Oncology

## 2017-01-06 ENCOUNTER — Other Ambulatory Visit (HOSPITAL_BASED_OUTPATIENT_CLINIC_OR_DEPARTMENT_OTHER): Payer: 59

## 2017-01-06 ENCOUNTER — Other Ambulatory Visit: Payer: Self-pay

## 2017-01-06 ENCOUNTER — Ambulatory Visit: Payer: 59

## 2017-01-06 ENCOUNTER — Telehealth: Payer: Self-pay | Admitting: Hematology and Oncology

## 2017-01-06 DIAGNOSIS — Z5189 Encounter for other specified aftercare: Secondary | ICD-10-CM

## 2017-01-06 DIAGNOSIS — G620 Drug-induced polyneuropathy: Secondary | ICD-10-CM

## 2017-01-06 DIAGNOSIS — Z95828 Presence of other vascular implants and grafts: Secondary | ICD-10-CM

## 2017-01-06 DIAGNOSIS — Z171 Estrogen receptor negative status [ER-]: Secondary | ICD-10-CM | POA: Diagnosis not present

## 2017-01-06 DIAGNOSIS — C50512 Malignant neoplasm of lower-outer quadrant of left female breast: Secondary | ICD-10-CM

## 2017-01-06 DIAGNOSIS — Z5111 Encounter for antineoplastic chemotherapy: Secondary | ICD-10-CM

## 2017-01-06 DIAGNOSIS — Z5112 Encounter for antineoplastic immunotherapy: Secondary | ICD-10-CM

## 2017-01-06 DIAGNOSIS — Z79899 Other long term (current) drug therapy: Secondary | ICD-10-CM

## 2017-01-06 DIAGNOSIS — Z5181 Encounter for therapeutic drug level monitoring: Secondary | ICD-10-CM

## 2017-01-06 LAB — COMPREHENSIVE METABOLIC PANEL
ALT: 61 U/L — ABNORMAL HIGH (ref 0–55)
AST: 46 U/L — ABNORMAL HIGH (ref 5–34)
Albumin: 3.7 g/dL (ref 3.5–5.0)
Alkaline Phosphatase: 98 U/L (ref 40–150)
Anion Gap: 10 mEq/L (ref 3–11)
BUN: 17.3 mg/dL (ref 7.0–26.0)
CO2: 25 mEq/L (ref 22–29)
Calcium: 8.9 mg/dL (ref 8.4–10.4)
Chloride: 104 mEq/L (ref 98–109)
Creatinine: 0.8 mg/dL (ref 0.6–1.1)
EGFR: >60 mL/min/{1.73_m2} (ref >60)
Glucose: 103 mg/dl (ref 70–140)
Potassium: 3.7 mEq/L (ref 3.5–5.1)
Sodium: 139 mEq/L (ref 136–145)
Total Bilirubin: 0.41 mg/dL (ref 0.20–1.20)
Total Protein: 7 g/dL (ref 6.4–8.3)

## 2017-01-06 LAB — CBC WITH DIFFERENTIAL/PLATELET
BASO%: 0.9 % (ref 0.0–2.0)
Basophils Absolute: 0 10*3/uL (ref 0.0–0.1)
EOS%: 0.2 % (ref 0.0–7.0)
Eosinophils Absolute: 0 10*3/uL (ref 0.0–0.5)
HCT: 33.4 % — ABNORMAL LOW (ref 34.8–46.6)
HGB: 11.1 g/dL — ABNORMAL LOW (ref 11.6–15.9)
LYMPH%: 37.3 % (ref 14.0–49.7)
MCH: 31.8 pg (ref 25.1–34.0)
MCHC: 33.2 g/dL (ref 31.5–36.0)
MCV: 95.7 fL (ref 79.5–101.0)
MONO#: 0.5 10*3/uL (ref 0.1–0.9)
MONO%: 11.1 % (ref 0.0–14.0)
NEUT#: 2.3 10*3/uL (ref 1.5–6.5)
NEUT%: 50.5 % (ref 38.4–76.8)
Platelets: 181 10*3/uL (ref 145–400)
RBC: 3.49 10*6/uL — ABNORMAL LOW (ref 3.70–5.45)
RDW: 16 % — ABNORMAL HIGH (ref 11.2–14.5)
WBC: 4.6 10*3/uL (ref 3.9–10.3)
lymph#: 1.7 10*3/uL (ref 0.9–3.3)

## 2017-01-06 MED ORDER — DIPHENHYDRAMINE HCL 25 MG PO CAPS
ORAL_CAPSULE | ORAL | Status: AC
  Filled 2017-01-06: qty 1

## 2017-01-06 MED ORDER — DIPHENHYDRAMINE HCL 25 MG PO CAPS
50.0000 mg | ORAL_CAPSULE | Freq: Once | ORAL | Status: AC
  Administered 2017-01-06: 50 mg via ORAL

## 2017-01-06 MED ORDER — LORAZEPAM 1 MG PO TABS: 1.0000 mg | ORAL_TABLET | Freq: Every day | ORAL | 2 refills | Status: DC

## 2017-01-06 MED ORDER — SODIUM CHLORIDE 0.9 % IV SOLN
Freq: Once | INTRAVENOUS | Status: AC
  Administered 2017-01-06: 12:00:00 via INTRAVENOUS
  Filled 2017-01-06: qty 5

## 2017-01-06 MED ORDER — HEPARIN SOD (PORK) LOCK FLUSH 100 UNIT/ML IV SOLN
500.0000 [IU] | Freq: Once | INTRAVENOUS | Status: AC | PRN
  Administered 2017-01-06: 500 [IU]
  Filled 2017-01-06: qty 5

## 2017-01-06 MED ORDER — ACETAMINOPHEN 325 MG PO TABS
650.0000 mg | ORAL_TABLET | Freq: Once | ORAL | Status: AC
  Administered 2017-01-06: 650 mg via ORAL

## 2017-01-06 MED ORDER — SODIUM CHLORIDE 0.9 % IV SOLN
Freq: Once | INTRAVENOUS | Status: AC
  Administered 2017-01-06: 12:00:00 via INTRAVENOUS

## 2017-01-06 MED ORDER — SODIUM CHLORIDE 0.9% FLUSH
10.0000 mL | INTRAVENOUS | Status: DC | PRN
  Administered 2017-01-06: 10 mL via INTRAVENOUS
  Filled 2017-01-06: qty 10

## 2017-01-06 MED ORDER — PALONOSETRON HCL INJECTION 0.25 MG/5ML
0.2500 mg | Freq: Once | INTRAVENOUS | Status: AC
  Administered 2017-01-06: 0.25 mg via INTRAVENOUS

## 2017-01-06 MED ORDER — DIPHENHYDRAMINE HCL 25 MG PO CAPS
25.0000 mg | ORAL_CAPSULE | Freq: Once | ORAL | Status: AC
  Administered 2017-01-06: 25 mg via ORAL

## 2017-01-06 MED ORDER — PEGFILGRASTIM 6 MG/0.6ML ~~LOC~~ PSKT
PREFILLED_SYRINGE | SUBCUTANEOUS | Status: AC
  Filled 2017-01-06: qty 0.6

## 2017-01-06 MED ORDER — DIPHENHYDRAMINE HCL 25 MG PO CAPS
ORAL_CAPSULE | ORAL | Status: AC
  Filled 2017-01-06: qty 2

## 2017-01-06 MED ORDER — TRASTUZUMAB CHEMO 150 MG IV SOLR
6.0000 mg/kg | Freq: Once | INTRAVENOUS | Status: AC
  Administered 2017-01-06: 378 mg via INTRAVENOUS
  Filled 2017-01-06: qty 18

## 2017-01-06 MED ORDER — SODIUM CHLORIDE 0.9 % IV SOLN
420.0000 mg | Freq: Once | INTRAVENOUS | Status: AC
  Administered 2017-01-06: 420 mg via INTRAVENOUS
  Filled 2017-01-06: qty 14

## 2017-01-06 MED ORDER — SODIUM CHLORIDE 0.9% FLUSH
10.0000 mL | INTRAVENOUS | Status: DC | PRN
  Administered 2017-01-06: 10 mL
  Filled 2017-01-06: qty 10

## 2017-01-06 MED ORDER — ACETAMINOPHEN 325 MG PO TABS
ORAL_TABLET | ORAL | Status: AC
  Filled 2017-01-06: qty 2

## 2017-01-06 MED ORDER — PEGFILGRASTIM 6 MG/0.6ML ~~LOC~~ PSKT
6.0000 mg | PREFILLED_SYRINGE | Freq: Once | SUBCUTANEOUS | Status: AC
  Administered 2017-01-06: 6 mg via SUBCUTANEOUS

## 2017-01-06 MED ORDER — PALONOSETRON HCL INJECTION 0.25 MG/5ML
INTRAVENOUS | Status: AC
  Filled 2017-01-06: qty 5

## 2017-01-06 MED ORDER — SODIUM CHLORIDE 0.9 % IV SOLN
578.0000 mg | Freq: Once | INTRAVENOUS | Status: AC
  Administered 2017-01-06: 580 mg via INTRAVENOUS
  Filled 2017-01-06: qty 58

## 2017-01-06 MED ORDER — SODIUM CHLORIDE 0.9 % IV SOLN
55.0000 mg/m2 | Freq: Once | INTRAVENOUS | Status: AC
  Administered 2017-01-06: 90 mg via INTRAVENOUS
  Filled 2017-01-06: qty 9

## 2017-01-06 NOTE — Progress Notes (Signed)
Patient Care Team: [name], MD as PCP - General (Family Medicine)  DIAGNOSIS:  Encounter Diagnosis  Name Primary?  . Malignant neoplasm of lower-outer quadrant of left breast of female, estrogen receptor negative ([place])     SUMMARY OF ONCOLOGIC HISTORY:   Malignant neoplasm of lower-outer quadrant of left breast of female, estrogen receptor negative ([place])   10/20/2016 Mammogram    Mammogram and ultrasound of the left breast revealed 1.7 cm mass at 4:00 position, 6:30 position 5 x 4 x 4 mm mass, 6:00 position 5 cm nipple 7 x 6 x 11 mm, left axillary lymph node with thickened cortex, T1c N1 stage II a AJCC 8      10/24/2016 Initial Diagnosis    Left breast biopsy 6:30 position 3 cm from nipple: IDC grade 2, DCIS, ER 0%, PR 0%, Ki-67 15% HER-2 positive ratio 2.1; 4:00 position 3 cm from nipple: IDC grade 2, DCIS, ER 0%, PR 0%, Ki-67 35%, HER-2 positive ratio 2.02; left axillary lymph node biopsy positive      11/04/2016 -  Neo-Adjuvant Chemotherapy    TCH Perjeta 6 cycles followed by Herceptin +/- Perjeta maintenance      11/30/2016 Genetic Testing    Negative genetic testing on the common hereditary cancer panel.  The Hereditary Gene Panel offered by [facility] includes sequencing and/or deletion duplication testing of the following 47 genes: APC, ATM, AXIN2, BARD1, BMPR1A, BRCA1, BRCA2, BRIP1, CDH1, CDK4, CDKN2A (p14ARF), CDKN2A (p16INK4a), CHEK2, CTNNA1, DICER1, EPCAM (Deletion/duplication testing only), GREM1 (promoter region deletion/duplication testing only), KIT, MEN1, MLH1, MSH2, MSH3, MSH6, MUTYH, NBN, NF1, NHTL1, PALB2, PDGFRA, PMS2, POLD1, POLE, PTEN, RAD50, RAD51C, RAD51D, SDHB, SDHC, SDHD, SMAD4, SMARCA4. STK11, TP53, TSC1, TSC2, and VHL.  The following genes were evaluated for sequence changes only: SDHA and HOXB13 c.251G>A variant only. The report date is November 30, 2016.        CHIEF COMPLIANT: Cycle 4 TCH Perjeta  INTERVAL HISTORY: Laura Anthony is a  41 year old with above-mentioned history left breast cancer currently neoadjuvant chemotherapy with TCH Perjeta.  Today is cycle 4 of treatment.  After cycle 3 she had to come back in for fluids and antiemetics because she had profound nausea and vomiting that was intractable.  Since receiving the fluids and antiemetics she did well.  Did not have any significant major nausea issues subsequently.  She was also prescribed Phenergan which seems to have helped her.  After she received IV fluids and antiemetics, she felt remarkably better.  She has excellent energy levels and was eating quite well without any problems or concerns.  REVIEW OF SYSTEMS:   Constitutional: Denies fevers, chills or abnormal weight loss Eyes: Denies blurriness of vision Ears, nose, mouth, throat, and face: Denies mucositis or sore throat Respiratory: Denies cough, dyspnea or wheezes Cardiovascular: Denies palpitation, chest discomfort Gastrointestinal: Nausea and vomiting Skin: Denies abnormal skin rashes Lymphatics: Denies new lymphadenopathy or easy bruising Neurological:Denies numbness, tingling or new weaknesses Behavioral/Psych: Mood is stable, no new changes  Extremities: No lower extremity edema All other systems were reviewed with the patient and are negative.  I have reviewed the past medical history, past surgical history, social history and family history with the patient and they are unchanged from previous note.  ALLERGIES:  is allergic to morphine and related.  MEDICATIONS:  Current Outpatient Medications  Medication Sig Dispense Refill  . ALPRAZolam (XANAX) 0.5 MG tablet Take 0.5 mg by mouth at bedtime as needed for anxiety.    Marland Kitchen amphetamine-dextroamphetamine (  ADDERALL) 20 MG tablet Take 20 mg by mouth daily as needed.    . busPIRone (BUSPAR) 5 MG tablet Take 5 mg by mouth daily as needed.    Marland Kitchen dexamethasone (DECADRON) 4 MG tablet Take 1 tablet (4 mg total) by mouth daily. Date 1 tablet daily before  chemotherapy and 1 tablet the day after (Patient not taking: Reported on 11/25/2016) 12 tablet 0  . doxycycline (VIBRA-TABS) 100 MG tablet Take 1 tablet (100 mg total) 2 (two) times daily by mouth. 14 tablet 0  . FLUoxetine (PROZAC) 20 MG capsule Take 20 mg by mouth every morning.    . lidocaine-prilocaine (EMLA) cream Apply to affected area once 30 g 3  . lisdexamfetamine (VYVANSE) 40 MG capsule Take 40 mg by mouth every morning.     . loperamide (IMODIUM) 2 MG capsule Take 2 mg by mouth as needed for diarrhea or loose stools.    Marland Kitchen loratadine (CLARITIN) 10 MG tablet Take 10 mg by mouth daily as needed for allergies.    Marland Kitchen LORazepam (ATIVAN) 1 MG tablet Take 1 tablet (1 mg total) by mouth daily. 30 tablet 2  . Nutritional Supplements (JUICE PLUS FIBRE PO) Take 2 each by mouth daily. [employer]    . Nutritional Supplements (JUICE PLUS FIBRE PO) Take 2 each by mouth daily. [employer]    . Nutritional Supplements (JUICE PLUS FIBRE PO) Take 2 each by mouth daily. [employer]    . Nutritional Supplements (JUICE PLUS FIBRE PO) Take 2 each by mouth daily. [employer]    . ondansetron (ZOFRAN) 8 MG tablet Take 1 tablet (8 mg total) 2 (two) times daily as needed by mouth for refractory nausea / vomiting. Start on day 3 after chemo. 30 tablet 1  . potassium chloride SA (K-DUR,KLOR-CON) 20 MEQ tablet Take 1 tablet (20 mEq total) by mouth daily. (Patient not taking: Reported on 12/09/2016) 30 tablet 1  . prochlorperazine (COMPAZINE) 10 MG tablet Take 1 tablet (10 mg total) every 6 (six) hours as needed by mouth (Nausea or vomiting). 30 tablet 1  . promethazine (PHENERGAN) 25 MG tablet Take 1 tablet (25 mg total) every 6 (six) hours as needed by mouth for nausea or vomiting. 30 tablet 0  . rizatriptan (MAXALT) 10 MG tablet Take 10 mg by mouth as needed for migraine. May repeat in 2 hours if needed    . zolpidem (AMBIEN) 10 MG tablet Take 10 mg by mouth at bedtime as needed for sleep.     No current  facility-administered medications for this visit.    Facility-Administered Medications Ordered in Other Visits  Medication Dose Route Frequency Provider Last Rate Last Dose  . CARBOplatin (PARAPLATIN) 580 mg in sodium chloride 0.9 % 250 mL chemo infusion  580 mg Intravenous Once Nicholas Lose, MD      . DOCEtaxel (TAXOTERE) 90 mg in sodium chloride 0.9 % 250 mL chemo infusion  55 mg/m2 (Treatment Plan Recorded) Intravenous Once Nicholas Lose, MD      . fosaprepitant (EMEND) 150 mg in sodium chloride 0.9 % 145 mL IVPB   Intravenous Once Nicholas Lose, MD      . heparin lock flush 100 unit/mL  500 Units Intracatheter Once PRN Nicholas Lose, MD      . pegfilgrastim (NEULASTA ONPRO KIT) injection 6 mg  6 mg Subcutaneous Once Nicholas Lose, MD      . pertuzumab (PERJETA) 420 mg in sodium chloride 0.9 % 250 mL chemo infusion  420 mg  Intravenous Once Nicholas Lose, MD      . sodium chloride flush (NS) 0.9 % injection 10 mL  10 mL Intracatheter PRN Nicholas Lose, MD      . trastuzumab (HERCEPTIN) 378 mg in sodium chloride 0.9 % 250 mL chemo infusion  6 mg/kg (Treatment Plan Recorded) Intravenous Once Nicholas Lose, MD        PHYSICAL EXAMINATION: ECOG PERFORMANCE STATUS: 1 - Symptomatic but completely ambulatory  Vitals:   01/06/17 1052  BP: 133/84  Pulse: 83  Resp: 20  Temp: 98.6 F (37 C)  SpO2: 98%   Filed Weights   01/06/17 1052  Weight: 146 lb 1.6 oz (66.3 kg)    GENERAL:alert, no distress and comfortable SKIN: skin color, texture, turgor are normal, no rashes or significant lesions EYES: normal, Conjunctiva are pink and non-injected, sclera clear OROPHARYNX:no exudate, no erythema and lips, buccal mucosa, and tongue normal  NECK: supple, thyroid normal size, non-tender, without nodularity LYMPH:  no palpable lymphadenopathy in the cervical, axillary or inguinal LUNGS: clear to auscultation and percussion with normal breathing effort HEART: regular rate & rhythm and no murmurs and  no lower extremity edema ABDOMEN:abdomen soft, non-tender and normal bowel sounds MUSCULOSKELETAL:no cyanosis of digits and no clubbing  NEURO: alert & oriented x 3 with fluent speech, no focal motor/sensory deficits EXTREMITIES: No lower extremity edema  LABORATORY DATA:  I have reviewed the data as listed   Chemistry      Component Value Date/Time   NA 139 01/06/2017 1033   K 3.7 01/06/2017 1033   CL 103 10/19/2010 2105   CO2 25 01/06/2017 1033   BUN 17.3 01/06/2017 1033   CREATININE 0.8 01/06/2017 1033      Component Value Date/Time   CALCIUM 8.9 01/06/2017 1033   ALKPHOS 98 01/06/2017 1033   AST 46 (H) 01/06/2017 1033   ALT 61 (H) 01/06/2017 1033   BILITOT 0.41 01/06/2017 1033       Lab Results  Component Value Date   WBC 4.6 01/06/2017   HGB 11.1 (L) 01/06/2017   HCT 33.4 (L) 01/06/2017   MCV 95.7 01/06/2017   PLT 181 01/06/2017   NEUTROABS 2.3 01/06/2017    ASSESSMENT & PLAN:  Malignant neoplasm of lower-outer quadrant of left breast of female, estrogen receptor negative ([facility]) Mammogram and ultrasound of the left breast revealed 1.7 cm mass at 4:00 position, 6:30 position 5 x 4 x 4 mm mass, 6:00 position 5 cm nipple 7 x 6 x 11 mm, left axillary lymph node with thickened cortex, T1c N1 stage II a AJCC 8  10/24/2016: Left breast biopsy 6:30 position 3 cm from nipple: IDC grade 2, DCIS, ER 0%, PR 0%, 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 Neoadjuvant chemotherapy with TCHP This would be followed by Herceptin maintenance for 1 year. 2. followed by breast conserving surgery if it is feasible 3. Followed by adjuvant radiation ---------------------------------------------------------------------------- Current treatment: Cycle 4 day 1TCH Perjeta Echocardiogram 11/03/2016: EF 55-60%  Chemo  toxicities: 1.  Profound nausea and vomiting for which she had to come back and receive IV fluids and antiemetics.  We added Emend to her treatment plan.  We will also give her IV fluids next Monday. 2. 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  If it continues to  progress then we will have to discontinue Taxotere from cycle 5 and 6. Monitoring closely for chemo toxicities  Return to clinic in 3 weeks for cycle 5   I spent 25 minutes talking to the patient of which more than half was spent in counseling and coordination of care.  No orders of the defined types were placed in this encounter.  The patient has a good understanding of the overall plan. she agrees with it. she will call with any problems that may develop before the next visit here.   Rulon Eisenmenger, MD 01/06/17

## 2017-01-06 NOTE — Progress Notes (Signed)
After deaccessing patient, patient verbalized feeling of "palms itching". Patient unable to recall duration of itch. Dr. Jana Hakim made aware. Orders given for dose of PO Benadryl to be given with coke and observe for 15 minutes.   1715: Patient expressed relief of itching palms after 56minute observation period. No concerns on exit. Patient educated to make itching feeling known immediately in the case of recurrence during chemotherapy. Patient verbalized understanding. No concerns on exit.

## 2017-01-06 NOTE — Telephone Encounter (Signed)
Gave patient avs report and appointments for December and January. Appointments for herceptin can be scheduled at next visit.

## 2017-01-06 NOTE — Assessment & Plan Note (Signed)
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 Neoadjuvant chemotherapy with TCHP This would be followed by Herceptin maintenance for 1 year. 2. followed by breast conserving surgery if it is feasible 3. Followed by adjuvant radiation ---------------------------------------------------------------------------- Current treatment: Cycle 4 day 1TCH Perjeta Echocardiogram 11/03/2016: EF 55-60%  Chemo toxicities: 1.  Profound nausea and vomiting for which she had to come back and receive IV fluids and antiemetics.  We added Emend to her treatment plan. 2. Alopecia Monitoring closely for chemo toxicities  Return to clinic in 3 weeks for cycle 5

## 2017-01-06 NOTE — Patient Instructions (Signed)
[facility] Discharge Instructions for Patients Receiving Chemotherapy  Today you received the following chemotherapy agents Herceptin, Perjeta, Taxotere and Carboplatin   To help prevent nausea and vomiting after your treatment, we encourage you to take your nausea medication as directed.    If you develop nausea and vomiting that is not controlled by your nausea medication, call the clinic.   BELOW ARE SYMPTOMS THAT SHOULD BE REPORTED IMMEDIATELY:  *FEVER GREATER THAN 100.5 F  *CHILLS WITH OR WITHOUT FEVER  NAUSEA AND VOMITING THAT IS NOT CONTROLLED WITH YOUR NAUSEA MEDICATION  *UNUSUAL SHORTNESS OF BREATH  *UNUSUAL BRUISING OR BLEEDING  TENDERNESS IN MOUTH AND THROAT WITH OR WITHOUT PRESENCE OF ULCERS  *URINARY PROBLEMS  *BOWEL PROBLEMS  UNUSUAL RASH Items with * indicate a potential emergency and should be followed up as soon as possible.  Feel free to call the clinic should you have any questions or concerns. The clinic phone number is [phone].  Please show the CHEMO ALERT CARD at check-in to the Emergency Department and triage nurse.   

## 2017-01-09 ENCOUNTER — Ambulatory Visit: Payer: 59

## 2017-01-10 ENCOUNTER — Telehealth: Payer: Self-pay

## 2017-01-10 NOTE — Telephone Encounter (Signed)
Spoke with patient and she is aware of her appt per 12/10 inbasket  Laura Anthony

## 2017-01-12 ENCOUNTER — Telehealth: Payer: Self-pay

## 2017-01-12 ENCOUNTER — Encounter: Payer: Self-pay | Admitting: Hematology and Oncology

## 2017-01-12 NOTE — Telephone Encounter (Signed)
Called pt to respond to her email with questions regarding her recent lab work and what to do with her current neuropathy symptoms.  Told pt that her liver function test is slightly elevated but still within treatment parameters. Dr. Lindi Adie is watching it closely and making sure that she is healthy enough to get treatment. Labs will be rechecked on her next appt. Pt verbalized understanding.  Pt has noticed increased in neuropathy symptoms in her fingers. Pt states that she is having some difficulty with doing certain activities and with closing her hands. Pt has some increased sensory loss. Pt denies any pain, burning at this time. Pt wants to make sure that she notifies Dr.Gudena. 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 Pt verbalized understanding. Told pt to monitor for pain/burning or increased sensory loss. Pt will need to call back when this occurs. No further needs at this time.

## 2017-01-20 ENCOUNTER — Other Ambulatory Visit: Payer: Self-pay

## 2017-01-20 DIAGNOSIS — Z171 Estrogen receptor negative status [ER-]: Secondary | ICD-10-CM

## 2017-01-20 DIAGNOSIS — R112 Nausea with vomiting, unspecified: Secondary | ICD-10-CM

## 2017-01-20 DIAGNOSIS — C50512 Malignant neoplasm of lower-outer quadrant of left female breast: Secondary | ICD-10-CM

## 2017-01-20 MED ORDER — PROMETHAZINE HCL 25 MG PO TABS: 25.0000 mg | ORAL_TABLET | Freq: Four times a day (QID) | ORAL | 0 refills | Status: DC | PRN

## 2017-01-26 NOTE — Assessment & Plan Note (Signed)
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 Neoadjuvant chemotherapy with TCHP This would be followed by Herceptin maintenance for 1 year. 2. followed by breast conserving surgery if it is feasible 3. Followed by adjuvant radiation ---------------------------------------------------------------------------- Current treatment: Cycle 5 day1TCH Perjeta Echocardiogram 11/03/2016: EF 55-60%  Chemo toxicities: 1.  Profound nausea and vomiting for which she had to come back and receive IV fluids and antiemetics.  We added Emend to her treatment plan.  We will also give her IV fluids next Monday. 2. 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  If it continues to progress then we will have to discontinue Taxotere from cycle 5 and 6. Monitoring closely for chemo toxicities  Return to clinic in 3 weeks for cycle 6 Will order breast MRI after that.

## 2017-01-27 ENCOUNTER — Encounter: Payer: Self-pay | Admitting: *Deleted

## 2017-01-27 ENCOUNTER — Other Ambulatory Visit: Payer: Self-pay

## 2017-01-27 ENCOUNTER — Ambulatory Visit: Payer: 59

## 2017-01-27 ENCOUNTER — Ambulatory Visit (HOSPITAL_BASED_OUTPATIENT_CLINIC_OR_DEPARTMENT_OTHER): Payer: 59

## 2017-01-27 ENCOUNTER — Ambulatory Visit (HOSPITAL_BASED_OUTPATIENT_CLINIC_OR_DEPARTMENT_OTHER): Payer: 59 | Admitting: Hematology and Oncology

## 2017-01-27 ENCOUNTER — Telehealth: Payer: Self-pay | Admitting: Hematology and Oncology

## 2017-01-27 ENCOUNTER — Other Ambulatory Visit (HOSPITAL_BASED_OUTPATIENT_CLINIC_OR_DEPARTMENT_OTHER): Payer: 59

## 2017-01-27 DIAGNOSIS — C50512 Malignant neoplasm of lower-outer quadrant of left female breast: Secondary | ICD-10-CM

## 2017-01-27 DIAGNOSIS — Z171 Estrogen receptor negative status [ER-]: Secondary | ICD-10-CM

## 2017-01-27 DIAGNOSIS — Z5112 Encounter for antineoplastic immunotherapy: Secondary | ICD-10-CM

## 2017-01-27 DIAGNOSIS — Z5111 Encounter for antineoplastic chemotherapy: Secondary | ICD-10-CM

## 2017-01-27 DIAGNOSIS — G620 Drug-induced polyneuropathy: Secondary | ICD-10-CM | POA: Diagnosis not present

## 2017-01-27 DIAGNOSIS — Z95828 Presence of other vascular implants and grafts: Secondary | ICD-10-CM

## 2017-01-27 LAB — CBC WITH DIFFERENTIAL/PLATELET
BASO%: 0.8 % (ref 0.0–2.0)
Basophils Absolute: 0 10*3/uL (ref 0.0–0.1)
EOS%: 0.2 % (ref 0.0–7.0)
Eosinophils Absolute: 0 10*3/uL (ref 0.0–0.5)
HCT: 33.4 % — ABNORMAL LOW (ref 34.8–46.6)
HGB: 11.1 g/dL — ABNORMAL LOW (ref 11.6–15.9)
LYMPH%: 41.1 % (ref 14.0–49.7)
MCH: 33.1 pg (ref 25.1–34.0)
MCHC: 33.4 g/dL (ref 31.5–36.0)
MCV: 99 fL (ref 79.5–101.0)
MONO#: 0.5 10*3/uL (ref 0.1–0.9)
MONO%: 11.9 % (ref 0.0–14.0)
NEUT#: 1.9 10*3/uL (ref 1.5–6.5)
NEUT%: 46 % (ref 38.4–76.8)
Platelets: 170 10*3/uL (ref 145–400)
RBC: 3.37 10*6/uL — ABNORMAL LOW (ref 3.70–5.45)
RDW: 18.2 % — ABNORMAL HIGH (ref 11.2–14.5)
WBC: 4.2 10*3/uL (ref 3.9–10.3)
lymph#: 1.7 10*3/uL (ref 0.9–3.3)

## 2017-01-27 LAB — COMPREHENSIVE METABOLIC PANEL
ALT: 23 U/L (ref 0–55)
AST: 22 U/L (ref 5–34)
Albumin: 3.5 g/dL (ref 3.5–5.0)
Alkaline Phosphatase: 85 U/L (ref 40–150)
Anion Gap: 7 mEq/L (ref 3–11)
BUN: 14 mg/dL (ref 7.0–26.0)
CO2: 26 mEq/L (ref 22–29)
Calcium: 8.6 mg/dL (ref 8.4–10.4)
Chloride: 105 mEq/L (ref 98–109)
Creatinine: 0.7 mg/dL (ref 0.6–1.1)
EGFR: >60 mL/min/{1.73_m2} (ref >60)
Glucose: 90 mg/dl (ref 70–140)
Potassium: 4.1 mEq/L (ref 3.5–5.1)
Sodium: 138 mEq/L (ref 136–145)
Total Bilirubin: 0.23 mg/dL (ref 0.20–1.20)
Total Protein: 6.5 g/dL (ref 6.4–8.3)

## 2017-01-27 MED ORDER — DIPHENHYDRAMINE HCL 25 MG PO CAPS
ORAL_CAPSULE | ORAL | Status: AC
  Filled 2017-01-27: qty 2

## 2017-01-27 MED ORDER — FAMOTIDINE 20 MG PO TABS
20.0000 mg | ORAL_TABLET | Freq: Once | ORAL | Status: AC
  Administered 2017-01-27: 20 mg via ORAL

## 2017-01-27 MED ORDER — FAMOTIDINE 20 MG PO TABS
ORAL_TABLET | ORAL | Status: AC
  Filled 2017-01-27: qty 1

## 2017-01-27 MED ORDER — ACETAMINOPHEN 325 MG PO TABS
650.0000 mg | ORAL_TABLET | Freq: Once | ORAL | Status: AC
  Administered 2017-01-27: 650 mg via ORAL

## 2017-01-27 MED ORDER — TRASTUZUMAB CHEMO 150 MG IV SOLR
6.0000 mg/kg | Freq: Once | INTRAVENOUS | Status: AC
  Administered 2017-01-27: 378 mg via INTRAVENOUS
  Filled 2017-01-27: qty 18

## 2017-01-27 MED ORDER — ACETAMINOPHEN 325 MG PO TABS
ORAL_TABLET | ORAL | Status: AC
  Filled 2017-01-27: qty 2

## 2017-01-27 MED ORDER — SODIUM CHLORIDE 0.9 % IV SOLN
Freq: Once | INTRAVENOUS | Status: AC
  Administered 2017-01-27: 10:00:00 via INTRAVENOUS
  Filled 2017-01-27: qty 5

## 2017-01-27 MED ORDER — SODIUM CHLORIDE 0.9% FLUSH
10.0000 mL | INTRAVENOUS | Status: DC | PRN
  Administered 2017-01-27: 10 mL via INTRAVENOUS
  Filled 2017-01-27: qty 10

## 2017-01-27 MED ORDER — SODIUM CHLORIDE 0.9 % IV SOLN
578.0000 mg | Freq: Once | INTRAVENOUS | Status: AC
  Administered 2017-01-27: 580 mg via INTRAVENOUS
  Filled 2017-01-27: qty 58

## 2017-01-27 MED ORDER — SODIUM CHLORIDE 0.9% FLUSH
10.0000 mL | INTRAVENOUS | Status: DC | PRN
  Administered 2017-01-27: 10 mL
  Filled 2017-01-27: qty 10

## 2017-01-27 MED ORDER — PALONOSETRON HCL INJECTION 0.25 MG/5ML
INTRAVENOUS | Status: AC
  Filled 2017-01-27: qty 5

## 2017-01-27 MED ORDER — SODIUM CHLORIDE 0.9 % IV SOLN
Freq: Once | INTRAVENOUS | Status: AC
  Administered 2017-01-27: 10:00:00 via INTRAVENOUS

## 2017-01-27 MED ORDER — DIPHENHYDRAMINE HCL 25 MG PO CAPS
50.0000 mg | ORAL_CAPSULE | Freq: Once | ORAL | Status: AC
  Administered 2017-01-27: 50 mg via ORAL

## 2017-01-27 MED ORDER — SODIUM CHLORIDE 0.9 % IV SOLN
420.0000 mg | Freq: Once | INTRAVENOUS | Status: AC
  Administered 2017-01-27: 420 mg via INTRAVENOUS
  Filled 2017-01-27: qty 14

## 2017-01-27 MED ORDER — PALONOSETRON HCL INJECTION 0.25 MG/5ML
0.2500 mg | Freq: Once | INTRAVENOUS | Status: AC
  Administered 2017-01-27: 0.25 mg via INTRAVENOUS

## 2017-01-27 MED ORDER — HEPARIN SOD (PORK) LOCK FLUSH 100 UNIT/ML IV SOLN
500.0000 [IU] | Freq: Once | INTRAVENOUS | Status: AC | PRN
  Administered 2017-01-27: 500 [IU]
  Filled 2017-01-27: qty 5

## 2017-01-27 NOTE — Telephone Encounter (Signed)
Scheduled appt per 12/28 los - Patient to get an updated schedule next visit.  

## 2017-01-27 NOTE — Patient Instructions (Signed)
[facility] Discharge Instructions for Patients Receiving Chemotherapy  Today you received the following chemotherapy agents: Herceptin, Perjeta, and Carboplatin   To help prevent nausea and vomiting after your treatment, we encourage you to take your nausea medication as directed.  If you develop nausea and vomiting that is not controlled by your nausea medication, call the clinic.   BELOW ARE SYMPTOMS THAT SHOULD BE REPORTED IMMEDIATELY:  *FEVER GREATER THAN 100.5 F  *CHILLS WITH OR WITHOUT FEVER  NAUSEA AND VOMITING THAT IS NOT CONTROLLED WITH YOUR NAUSEA MEDICATION  *UNUSUAL SHORTNESS OF BREATH  *UNUSUAL BRUISING OR BLEEDING  TENDERNESS IN MOUTH AND THROAT WITH OR WITHOUT PRESENCE OF ULCERS  *URINARY PROBLEMS  *BOWEL PROBLEMS  UNUSUAL RASH Items with * indicate a potential emergency and should be followed up as soon as possible.  Feel free to call the clinic should you have any questions or concerns. The clinic phone number is [phone].  Please show the [place] at check-in to the Emergency Department and triage nurse.

## 2017-01-27 NOTE — Progress Notes (Signed)
Patient Care Team: [name], MD as PCP - General (Family Medicine)  DIAGNOSIS:  Encounter Diagnosis  Name Primary?  . Malignant neoplasm of lower-outer quadrant of left breast of female, estrogen receptor negative ([place])     SUMMARY OF ONCOLOGIC HISTORY:   Malignant neoplasm of lower-outer quadrant of left breast of female, estrogen receptor negative ([place])   10/20/2016 Mammogram    Mammogram and ultrasound of the left breast revealed 1.7 cm mass at 4:00 position, 6:30 position 5 x 4 x 4 mm mass, 6:00 position 5 cm nipple 7 x 6 x 11 mm, left axillary lymph node with thickened cortex, T1c N1 stage II a AJCC 8      10/24/2016 Initial Diagnosis    Left breast biopsy 6:30 position 3 cm from nipple: IDC grade 2, DCIS, ER 0%, PR 0%, Ki-67 15% HER-2 positive ratio 2.1; 4:00 position 3 cm from nipple: IDC grade 2, DCIS, ER 0%, PR 0%, Ki-67 35%, HER-2 positive ratio 2.02; left axillary lymph node biopsy positive      11/04/2016 -  Neo-Adjuvant Chemotherapy    TCH Perjeta 6 cycles followed by Herceptin +/- Perjeta maintenance      11/30/2016 Genetic Testing    Negative genetic testing on the common hereditary cancer panel.  The Hereditary Gene Panel offered by [facility] includes sequencing and/or deletion duplication testing of the following 47 genes: APC, ATM, AXIN2, BARD1, BMPR1A, BRCA1, BRCA2, BRIP1, CDH1, CDK4, CDKN2A (p14ARF), CDKN2A (p16INK4a), CHEK2, CTNNA1, DICER1, EPCAM (Deletion/duplication testing only), GREM1 (promoter region deletion/duplication testing only), KIT, MEN1, MLH1, MSH2, MSH3, MSH6, MUTYH, NBN, NF1, NHTL1, PALB2, PDGFRA, PMS2, POLD1, POLE, PTEN, RAD50, RAD51C, RAD51D, SDHB, SDHC, SDHD, SMAD4, SMARCA4. STK11, TP53, TSC1, TSC2, and VHL.  The following genes were evaluated for sequence changes only: SDHA and HOXB13 c.251G>A variant only. The report date is November 30, 2016.        CHIEF COMPLIANT: Cycle 5 TCH Perjeta  INTERVAL HISTORY: Francesa Eugenio is a  41 year old with above-mentioned history of HER-2 positive left breast cancer who is currently on [place] on today's cycle 5.  She has had profound nausea and vomiting due to chemotherapy as well as peripheral neuropathy.  She did much better with cycle 4 of chemotherapy and had a lot of energy.  She did not need IV fluids after that.  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  It is not in her feet.  It has not gotten any worse than last treatment.  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  I have reviewed the past medical history, past surgical history, social history and family history with the patient and they are unchanged from previous note.  ALLERGIES:  is allergic to morphine and related.  MEDICATIONS:  Current Outpatient Medications  Medication Sig Dispense Refill  . ALPRAZolam (XANAX) 0.5 MG tablet Take 0.5 mg by mouth at bedtime as needed for anxiety.    Marland Kitchen amphetamine-dextroamphetamine (ADDERALL) 20 MG tablet Take 20 mg by mouth daily as needed.    Marland Kitchen  busPIRone (BUSPAR) 5 MG tablet Take 5 mg by mouth daily as needed.    Marland Kitchen dexamethasone (DECADRON) 4 MG tablet Take 1 tablet (4 mg total) by mouth daily. Date 1 tablet daily before chemotherapy and 1 tablet the day after (Patient not taking: Reported on 11/25/2016) 12 tablet 0  . doxycycline (VIBRA-TABS) 100 MG tablet Take 1 tablet (100 mg total) 2  (two) times daily by mouth. 14 tablet 0  . FLUoxetine (PROZAC) 20 MG capsule Take 20 mg by mouth every morning.    . lidocaine-prilocaine (EMLA) cream Apply to affected area once 30 g 3  . lisdexamfetamine (VYVANSE) 40 MG capsule Take 40 mg by mouth every morning.     . loperamide (IMODIUM) 2 MG capsule Take 2 mg by mouth as needed for diarrhea or loose stools.    Marland Kitchen loratadine (CLARITIN) 10 MG tablet Take 10 mg by mouth daily as needed for allergies.    Marland Kitchen LORazepam (ATIVAN) 1 MG tablet Take 1 tablet (1 mg total) by mouth daily. 30 tablet 2  . Nutritional Supplements (JUICE PLUS FIBRE PO) Take 2 each by mouth daily. [employer]    . Nutritional Supplements (JUICE PLUS FIBRE PO) Take 2 each by mouth daily. [employer]    . Nutritional Supplements (JUICE PLUS FIBRE PO) Take 2 each by mouth daily. [employer]    . Nutritional Supplements (JUICE PLUS FIBRE PO) Take 2 each by mouth daily. [employer]    . ondansetron (ZOFRAN) 8 MG tablet Take 1 tablet (8 mg total) 2 (two) times daily as needed by mouth for refractory nausea / vomiting. Start on day 3 after chemo. 30 tablet 1  . potassium chloride SA (K-DUR,KLOR-CON) 20 MEQ tablet Take 1 tablet (20 mEq total) by mouth daily. (Patient not taking: Reported on 12/09/2016) 30 tablet 1  . prochlorperazine (COMPAZINE) 10 MG tablet Take 1 tablet (10 mg total) every 6 (six) hours as needed by mouth (Nausea or vomiting). 30 tablet 1  . promethazine (PHENERGAN) 25 MG tablet Take 1 tablet (25 mg total) by mouth every 6 (six) hours as needed for nausea or vomiting. 30 tablet 0  . rizatriptan (MAXALT) 10 MG tablet Take 10 mg by mouth as needed for migraine. May repeat in 2 hours if needed    . zolpidem (AMBIEN) 10 MG tablet Take 10 mg by mouth at bedtime as needed for sleep.     No current facility-administered medications for this visit.    Facility-Administered Medications Ordered in Other Visits  Medication Dose Route Frequency Provider Last Rate Last Dose    . 0.9 %  sodium chloride infusion   Intravenous Once Nicholas Lose, MD      . acetaminophen (TYLENOL) tablet 650 mg  650 mg Oral Once Nicholas Lose, MD      . CARBOplatin (PARAPLATIN) 580 mg in sodium chloride 0.9 % 250 mL chemo infusion  580 mg Intravenous Once Nicholas Lose, MD      . diphenhydrAMINE (BENADRYL) capsule 50 mg  50 mg Oral Once Nicholas Lose, MD      . fosaprepitant (EMEND) 150 mg in sodium chloride 0.9 % 145 mL IVPB   Intravenous Once Nicholas Lose, MD      . heparin lock flush 100 unit/mL  500 Units Intracatheter Once PRN Nicholas Lose, MD      . palonosetron (ALOXI) injection 0.25 mg  0.25 mg Intravenous Once Nicholas Lose, MD      . pertuzumab (PERJETA) 420 mg in sodium  chloride 0.9 % 250 mL chemo infusion  420 mg Intravenous Once Nicholas Lose, MD      . sodium chloride flush (NS) 0.9 % injection 10 mL  10 mL Intracatheter PRN Nicholas Lose, MD      . trastuzumab (HERCEPTIN) 378 mg in sodium chloride 0.9 % 250 mL chemo infusion  6 mg/kg (Treatment Plan Recorded) Intravenous Once Nicholas Lose, MD        PHYSICAL EXAMINATION: ECOG PERFORMANCE STATUS: 2 - Symptomatic, <50% confined to bed  Vitals:   01/27/17 0844  BP: (!) 128/96  Pulse: 76  Resp: 20  Temp: 98.2 F (36.8 C)  SpO2: 100%   Filed Weights   01/27/17 0844  Weight: 155 lb 1.6 oz (70.4 kg)    GENERAL:alert, no distress and comfortable SKIN: skin color, texture, turgor are normal, no rashes or significant lesions EYES: normal, Conjunctiva are pink and non-injected, sclera clear OROPHARYNX:no exudate, no erythema and lips, buccal mucosa, and tongue normal  NECK: supple, thyroid normal size, non-tender, without nodularity LYMPH:  no palpable lymphadenopathy in the cervical, axillary or inguinal LUNGS: clear to auscultation and percussion with normal breathing effort HEART: regular rate & rhythm and no murmurs and no lower extremity edema ABDOMEN:abdomen soft, non-tender and normal bowel  sounds MUSCULOSKELETAL:no cyanosis of digits and no clubbing  NEURO: alert & oriented x 3 with fluent speech, peripheral neuropathy in hands EXTREMITIES: No lower extremity edema   LABORATORY DATA:  I have reviewed the data as listed   Chemistry      Component Value Date/Time   NA 138 01/27/2017 0802   K 4.1 01/27/2017 0802   CL 103 10/19/2010 2105   CO2 26 01/27/2017 0802   BUN 14.0 01/27/2017 0802   CREATININE 0.7 01/27/2017 0802      Component Value Date/Time   CALCIUM 8.6 01/27/2017 0802   ALKPHOS 85 01/27/2017 0802   AST 22 01/27/2017 0802   ALT 23 01/27/2017 0802   BILITOT 0.23 01/27/2017 0802       Lab Results  Component Value Date   WBC 4.2 01/27/2017   HGB 11.1 (L) 01/27/2017   HCT 33.4 (L) 01/27/2017   MCV 99.0 01/27/2017   PLT 170 01/27/2017   NEUTROABS 1.9 01/27/2017    ASSESSMENT & PLAN:  Malignant neoplasm of lower-outer quadrant of left breast of female, estrogen receptor negative ([place]) Mammogram and ultrasound of the left breast revealed 1.7 cm mass at 4:00 position, 6:30 position 5 x 4 x 4 mm mass, 6:00 position 5 cm nipple 7 x 6 x 11 mm, left axillary lymph node with thickened cortex, T1c N1 stage II a AJCC 8  10/24/2016: Left breast biopsy 6:30 position 3 cm from nipple: IDC grade 2, DCIS, ER 0%, PR 0%, 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 Neoadjuvant chemotherapy with TCHP This would be followed by Herceptin maintenance for 1 year. 2. followed by breast conserving surgery if it is feasible 3. Followed by adjuvant radiation ---------------------------------------------------------------------------- Current treatment: Cycle 5 day1TCH Perjeta (Taxotere discontinued for cycle 5 and 6) Echocardiogram 11/03/2016: EF 55-60%  Chemo toxicities: 1.  Profound nausea and vomiting for  which she had to come back and receive IV fluids and antiemetics.  We added Emend to her treatment plan.  We will also give her IV fluids next Monday. 2. 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  I we decided to discontinue Taxotere from cycle 5 and 6 because of progressive neuropathy affecting both of her hands.   We will discontinue Neulasta as well.   monitoring closely for chemo toxicities  Return to clinic in 3 weeks for cycle 6 Will order breast MRI after that.     I spent 25 minutes talking to the patient of which more than half was spent in counseling and coordination of care.  Orders Placed This Encounter  Procedures  . MR BREAST BILATERAL W WO CONTRAST    Standing Status:   Future    Standing Expiration Date:   03/30/2018    Order Specific Question:   If indicated for the ordered procedure, I authorize the administration of contrast media per Radiology protocol    Answer:   Yes    Order Specific Question:   What is the patient's sedation requirement?    Answer:   No Sedation    Order Specific Question:   Does the patient have a pacemaker or implanted devices?    Answer:   No    Order Specific Question:   Radiology Contrast Protocol - do NOT remove file path    Answer:   [email or web address]    Order Specific Question:   Reason for Exam additional comments    Answer:   Post neoadj chemo MRI    Order Specific Question:   Preferred imaging location?    Answer:   [facility] (table limit-350 lbs)   The patient has a good understanding of the overall plan. she agrees with it. she will call with any problems that may develop before the next visit here.   Harriette Ohara, MD 01/27/17

## 2017-02-01 ENCOUNTER — Telehealth: Payer: Self-pay | Admitting: Hematology and Oncology

## 2017-02-01 NOTE — Telephone Encounter (Signed)
LVM for upcoming MRI appointment

## 2017-02-09 ENCOUNTER — Encounter: Payer: Self-pay | Admitting: Hematology and Oncology

## 2017-02-13 ENCOUNTER — Ambulatory Visit (HOSPITAL_COMMUNITY)
Admission: RE | Admit: 2017-02-13 | Discharge: 2017-02-13 | Disposition: A | Discharge status: Home / Self Care | Payer: 59 | Source: Ambulatory Visit | Attending: Hematology and Oncology | Admitting: Hematology and Oncology

## 2017-02-13 DIAGNOSIS — Z5181 Encounter for therapeutic drug level monitoring: Secondary | ICD-10-CM | POA: Diagnosis not present

## 2017-02-13 DIAGNOSIS — I253 Aneurysm of heart: Secondary | ICD-10-CM | POA: Diagnosis not present

## 2017-02-13 DIAGNOSIS — C50512 Malignant neoplasm of lower-outer quadrant of left female breast: Secondary | ICD-10-CM | POA: Insufficient documentation

## 2017-02-13 DIAGNOSIS — Z79899 Other long term (current) drug therapy: Secondary | ICD-10-CM | POA: Insufficient documentation

## 2017-02-13 DIAGNOSIS — Z171 Estrogen receptor negative status [ER-]: Secondary | ICD-10-CM

## 2017-02-13 NOTE — Progress Notes (Signed)
  Echocardiogram 2D Echocardiogram has been performed.  Laura Anthony 02/13/2017, 9:44 AM

## 2017-02-15 NOTE — Assessment & Plan Note (Signed)
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 Neoadjuvant chemotherapy with TCHP This would be followed by Herceptin maintenance for 1 year. 2. followed by breast conserving surgery if it is feasible 3. 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

## 2017-02-16 ENCOUNTER — Other Ambulatory Visit: Payer: Self-pay

## 2017-02-16 ENCOUNTER — Inpatient Hospital Stay: Payer: 59 | Attending: Hematology and Oncology | Admitting: Hematology and Oncology

## 2017-02-16 ENCOUNTER — Inpatient Hospital Stay: Payer: 59

## 2017-02-16 DIAGNOSIS — C50512 Malignant neoplasm of lower-outer quadrant of left female breast: Secondary | ICD-10-CM | POA: Diagnosis not present

## 2017-02-16 DIAGNOSIS — Z171 Estrogen receptor negative status [ER-]: Secondary | ICD-10-CM | POA: Diagnosis not present

## 2017-02-16 DIAGNOSIS — Z79899 Other long term (current) drug therapy: Secondary | ICD-10-CM

## 2017-02-16 DIAGNOSIS — Z5111 Encounter for antineoplastic chemotherapy: Secondary | ICD-10-CM | POA: Insufficient documentation

## 2017-02-16 DIAGNOSIS — D696 Thrombocytopenia, unspecified: Secondary | ICD-10-CM | POA: Diagnosis not present

## 2017-02-16 DIAGNOSIS — R112 Nausea with vomiting, unspecified: Secondary | ICD-10-CM | POA: Diagnosis not present

## 2017-02-16 LAB — COMPREHENSIVE METABOLIC PANEL
ALT: 16 U/L (ref 0–55)
AST: 18 U/L (ref 5–34)
Albumin: 3.8 g/dL (ref 3.5–5.0)
Alkaline Phosphatase: 80 U/L (ref 40–150)
Anion gap: 7 (ref 3–11)
BUN: 18 mg/dL (ref 7–26)
CO2: 28 mmol/L (ref 22–29)
Calcium: 9 mg/dL (ref 8.4–10.4)
Chloride: 104 mmol/L (ref 98–109)
Creatinine, Ser: 0.79 mg/dL (ref 0.60–1.10)
GFR calc Af Amer: >60 mL/min (ref >60)
GFR calc non Af Amer: >60 mL/min (ref >60)
Glucose, Bld: 98 mg/dL (ref 70–140)
Potassium: 3.7 mmol/L (ref 3.3–4.7)
Sodium: 139 mmol/L (ref 136–145)
Total Bilirubin: 0.4 mg/dL (ref 0.2–1.2)
Total Protein: 7.2 g/dL (ref 6.4–8.3)

## 2017-02-16 LAB — CBC WITH DIFFERENTIAL/PLATELET
Basophils Absolute: 0 10*3/uL (ref 0.0–0.1)
Basophils Relative: 0 %
Eosinophils Absolute: 0 10*3/uL (ref 0.0–0.5)
Eosinophils Relative: 1 %
HCT: 32.6 % — ABNORMAL LOW (ref 34.8–46.6)
Hemoglobin: 11.1 g/dL — ABNORMAL LOW (ref 11.6–15.9)
Lymphocytes Relative: 54 %
Lymphs Abs: 1.9 10*3/uL (ref 0.9–3.3)
MCH: 33.7 pg (ref 25.1–34.0)
MCHC: 34 g/dL (ref 31.5–36.0)
MCV: 99.1 fL (ref 79.5–101.0)
Monocytes Absolute: 0.2 10*3/uL (ref 0.1–0.9)
Monocytes Relative: 6 %
Neutro Abs: 1.4 10*3/uL — ABNORMAL LOW (ref 1.5–6.5)
Neutrophils Relative %: 39 %
Platelets: 74 10*3/uL — ABNORMAL LOW (ref 145–400)
RBC: 3.29 MIL/uL — ABNORMAL LOW (ref 3.70–5.45)
RDW: 14.8 % (ref 11.2–16.1)
WBC: 3.6 10*3/uL — ABNORMAL LOW (ref 3.9–10.3)

## 2017-02-16 MED ORDER — SODIUM CHLORIDE 0.9 % IJ SOLN
10.0000 mL | Freq: Once | INTRAMUSCULAR | Status: AC
  Administered 2017-02-16: 10 mL via INTRAVENOUS
  Filled 2017-02-16: qty 10

## 2017-02-16 MED ORDER — HEPARIN SOD (PORK) LOCK FLUSH 100 UNIT/ML IV SOLN
500.0000 [IU] | Freq: Once | INTRAVENOUS | Status: AC
  Administered 2017-02-16: 500 [IU] via INTRAVENOUS
  Filled 2017-02-16: qty 5

## 2017-02-16 NOTE — Progress Notes (Signed)
Patient Care Team: [name], MD as PCP - General (Family Medicine)  DIAGNOSIS:  Encounter Diagnosis  Name Primary?  . Malignant neoplasm of lower-outer quadrant of left breast of female, estrogen receptor negative ([place])     SUMMARY OF ONCOLOGIC HISTORY:   Malignant neoplasm of lower-outer quadrant of left breast of female, estrogen receptor negative ([place])   10/20/2016 Mammogram    Mammogram and ultrasound of the left breast revealed 1.7 cm mass at 4:00 position, 6:30 position 5 x 4 x 4 mm mass, 6:00 position 5 cm nipple 7 x 6 x 11 mm, left axillary lymph node with thickened cortex, T1c N1 stage II a AJCC 8      10/24/2016 Initial Diagnosis    Left breast biopsy 6:30 position 3 cm from nipple: IDC grade 2, DCIS, ER 0%, PR 0%, Ki-67 15% HER-2 positive ratio 2.1; 4:00 position 3 cm from nipple: IDC grade 2, DCIS, ER 0%, PR 0%, Ki-67 35%, HER-2 positive ratio 2.02; left axillary lymph node biopsy positive      11/04/2016 -  Neo-Adjuvant Chemotherapy    TCH Perjeta 6 cycles followed by Herceptin +/- Perjeta maintenance      11/30/2016 Genetic Testing    Negative genetic testing on the common hereditary cancer panel.  The Hereditary Gene Panel offered by [facility] includes sequencing and/or deletion duplication testing of the following 47 genes: APC, ATM, AXIN2, BARD1, BMPR1A, BRCA1, BRCA2, BRIP1, CDH1, CDK4, CDKN2A (p14ARF), CDKN2A (p16INK4a), CHEK2, CTNNA1, DICER1, EPCAM (Deletion/duplication testing only), GREM1 (promoter region deletion/duplication testing only), KIT, MEN1, MLH1, MSH2, MSH3, MSH6, MUTYH, NBN, NF1, NHTL1, PALB2, PDGFRA, PMS2, POLD1, POLE, PTEN, RAD50, RAD51C, RAD51D, SDHB, SDHC, SDHD, SMAD4, SMARCA4. STK11, TP53, TSC1, TSC2, and VHL.  The following genes were evaluated for sequence changes only: SDHA and HOXB13 c.251G>A variant only. The report date is November 30, 2016.        CHIEF COMPLIANT: Last cycle of chemotherapy, cycle 6  INTERVAL HISTORY: Aryianna Earwood is a 42 year old with above-mentioned history of breast cancer who is currently neoadjuvant chemotherapy and today is cycle 6 of [place] although Taxotere was discontinued for neuropathy.  She is continuing to get the carboplatin along with Herceptin and Perjeta.  She is tolerating the current treatment fairly well.  She does not have any further issues with nausea vomiting.  Denies any diarrhea related to Perjeta.  REVIEW OF SYSTEMS:   Constitutional: Denies fevers, chills or abnormal weight loss Eyes: Denies blurriness of vision Ears, nose, mouth, throat, and face: Denies mucositis or sore throat Respiratory: Denies cough, dyspnea or wheezes Cardiovascular: Denies palpitation, chest discomfort Gastrointestinal:  Denies nausea, heartburn or change in bowel habits Skin: Denies abnormal skin rashes Lymphatics: Denies new lymphadenopathy or easy bruising Neurological:Denies numbness, tingling or new weaknesses Behavioral/Psych: Mood is stable, no new changes  Extremities: No lower extremity edema  All other systems were reviewed with the patient and are negative.  I have reviewed the past medical history, past surgical history, social history and family history with the patient and they are unchanged from previous note.  ALLERGIES:  is allergic to morphine and related.  MEDICATIONS:  Current Outpatient Medications  Medication Sig Dispense Refill  . ALPRAZolam (XANAX) 0.5 MG tablet Take 0.5 mg by mouth at bedtime as needed for anxiety.    Marland Kitchen amphetamine-dextroamphetamine (ADDERALL) 20 MG tablet Take 20 mg by mouth daily as needed.    . busPIRone (BUSPAR) 5 MG tablet Take 5 mg by mouth daily as needed.    Marland Kitchen  dexamethasone (DECADRON) 4 MG tablet Take 1 tablet (4 mg total) by mouth daily. Date 1 tablet daily before chemotherapy and 1 tablet the day after (Patient not taking: Reported on 11/25/2016) 12 tablet 0  . doxycycline (VIBRA-TABS) 100 MG tablet Take 1 tablet (100 mg total) 2  (two) times daily by mouth. 14 tablet 0  . FLUoxetine (PROZAC) 20 MG capsule Take 20 mg by mouth every morning.    . lidocaine-prilocaine (EMLA) cream Apply to affected area once 30 g 3  . lisdexamfetamine (VYVANSE) 40 MG capsule Take 40 mg by mouth every morning.     . loperamide (IMODIUM) 2 MG capsule Take 2 mg by mouth as needed for diarrhea or loose stools.    Marland Kitchen loratadine (CLARITIN) 10 MG tablet Take 10 mg by mouth daily as needed for allergies.    Marland Kitchen LORazepam (ATIVAN) 1 MG tablet Take 1 tablet (1 mg total) by mouth daily. 30 tablet 2  . Nutritional Supplements (JUICE PLUS FIBRE PO) Take 2 each by mouth daily. [employer]    . Nutritional Supplements (JUICE PLUS FIBRE PO) Take 2 each by mouth daily. [employer]    . Nutritional Supplements (JUICE PLUS FIBRE PO) Take 2 each by mouth daily. [employer]    . Nutritional Supplements (JUICE PLUS FIBRE PO) Take 2 each by mouth daily. [employer]    . ondansetron (ZOFRAN) 8 MG tablet Take 1 tablet (8 mg total) 2 (two) times daily as needed by mouth for refractory nausea / vomiting. Start on day 3 after chemo. 30 tablet 1  . potassium chloride SA (K-DUR,KLOR-CON) 20 MEQ tablet Take 1 tablet (20 mEq total) by mouth daily. (Patient not taking: Reported on 12/09/2016) 30 tablet 1  . prochlorperazine (COMPAZINE) 10 MG tablet Take 1 tablet (10 mg total) every 6 (six) hours as needed by mouth (Nausea or vomiting). 30 tablet 1  . promethazine (PHENERGAN) 25 MG tablet Take 1 tablet (25 mg total) by mouth every 6 (six) hours as needed for nausea or vomiting. 30 tablet 0  . rizatriptan (MAXALT) 10 MG tablet Take 10 mg by mouth as needed for migraine. May repeat in 2 hours if needed    . zolpidem (AMBIEN) 10 MG tablet Take 10 mg by mouth at bedtime as needed for sleep.     No current facility-administered medications for this visit.     PHYSICAL EXAMINATION: ECOG PERFORMANCE STATUS: 1 - Symptomatic but completely ambulatory  Vitals:   02/16/17 1149    BP: 118/87  Pulse: 87  Resp: 18  Temp: 98.2 F (36.8 C)  SpO2: 97%   Filed Weights   02/16/17 1149  Weight: 151 lb 12.8 oz (68.9 kg)    GENERAL:alert, no distress and comfortable SKIN: skin color, texture, turgor are normal, no rashes or significant lesions EYES: normal, Conjunctiva are pink and non-injected, sclera clear OROPHARYNX:no exudate, no erythema and lips, buccal mucosa, and tongue normal  NECK: supple, thyroid normal size, non-tender, without nodularity LYMPH:  no palpable lymphadenopathy in the cervical, axillary or inguinal LUNGS: clear to auscultation and percussion with normal breathing effort HEART: regular rate & rhythm and no murmurs and no lower extremity edema ABDOMEN:abdomen soft, non-tender and normal bowel sounds MUSCULOSKELETAL:no cyanosis of digits and no clubbing  NEURO: alert & oriented x 3 with fluent speech, no focal motor/sensory deficits EXTREMITIES: No lower extremity edema  LABORATORY DATA:  I have reviewed the data as listed CMP Latest Ref Rng & Units 01/27/2017 01/06/2017 12/19/2016  Glucose 70 - 140 mg/dl 90 103 99  BUN 7.0 - 26.0 mg/dL 14.0 17.3 11.7  Creatinine 0.6 - 1.1 mg/dL 0.7 0.8 0.8  Sodium 136 - 145 mEq/L 138 139 137  Potassium 3.5 - 5.1 mEq/L 4.1 3.7 3.8  Chloride 96 - 112 mEq/L - - -  CO2 22 - 29 mEq/L _0 Calcium 8.4 - 10.4 mg/dL 8.6 8.9 9.0  Total Protein 6.4 - 8.3 g/dL 6.5 7.0 7.2  Total Bilirubin 0.20 - 1.20 mg/dL 0.23 0.41 0.66  Alkaline Phos 40 - 150 U/L 85 98 103  AST 5 - 34 U/L 22 46(H) 28  ALT 0 - 55 U/L 23 61(H) 22    Lab Results  Component Value Date   WBC 3.6 (L) 02/16/2017   HGB 11.1 (L) 02/16/2017   HCT 32.6 (L) 02/16/2017   MCV 99.1 02/16/2017   PLT 74 (L) 02/16/2017   NEUTROABS 1.4 (L) 02/16/2017    ASSESSMENT & PLAN:  Malignant neoplasm of lower-outer quadrant of left breast of female, estrogen receptor negative ([place]) Mammogram and ultrasound of the left breast revealed 1.7 cm mass at 4:00  position, 6:30 position 5 x 4 x 4 mm mass, 6:00 position 5 cm nipple 7 x 6 x 11 mm, left axillary lymph node with thickened cortex, T1c N1 stage II a AJCC 8  10/24/2016: Left breast biopsy 6:30 position 3 cm from nipple: IDC grade 2, DCIS, ER 0%, PR 0%, 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 Neoadjuvant chemotherapy with TCHP This would be followed by Herceptin maintenance for 1 year. 2. followed by breast conserving surgery if it is feasible 3. 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  Thrombocytopenia platelet count 74: I reduced the dosage of carboplatin today.  We will proceed with her treatment since this is her last chemo.  Subsequently she will be on Herceptin Perjeta maintenance.  We will see what the final pathology report is before deciding on whether she should continue on with Perjeta before the rest of the year.   monitoring closely for chemo toxicities  Patient has an appointment for breast MRI and will follow-up after that with surgery.     I spent 25 minutes talking to the patient of which more than half was spent in counseling and coordination of care.  No orders of the defined types were placed in this encounter.  The patient has a good understanding of the overall plan. she agrees with it. she will call with any problems that may develop before the next visit here.   Harriette Ohara, MD 02/16/17

## 2017-02-16 NOTE — Patient Instructions (Signed)

## 2017-02-17 ENCOUNTER — Inpatient Hospital Stay: Payer: 59

## 2017-02-17 ENCOUNTER — Other Ambulatory Visit: Payer: Self-pay

## 2017-02-17 ENCOUNTER — Telehealth: Payer: Self-pay | Admitting: Hematology and Oncology

## 2017-02-17 VITALS — BP 120/79 | HR 69 | Temp 98.7°F | Resp 18

## 2017-02-17 DIAGNOSIS — C50512 Malignant neoplasm of lower-outer quadrant of left female breast: Secondary | ICD-10-CM | POA: Diagnosis not present

## 2017-02-17 DIAGNOSIS — Z171 Estrogen receptor negative status [ER-]: Secondary | ICD-10-CM

## 2017-02-17 LAB — CBC WITH DIFFERENTIAL/PLATELET
Basophils Absolute: 0 10*3/uL (ref 0.0–0.1)
Basophils Relative: 0 %
Eosinophils Absolute: 0 10*3/uL (ref 0.0–0.5)
Eosinophils Relative: 1 %
HCT: 32.5 % — ABNORMAL LOW (ref 34.8–46.6)
Hemoglobin: 11.1 g/dL — ABNORMAL LOW (ref 11.6–15.9)
Lymphocytes Relative: 51 %
Lymphs Abs: 1.7 10*3/uL (ref 0.9–3.3)
MCH: 33.8 pg (ref 25.1–34.0)
MCHC: 34.2 g/dL (ref 31.5–36.0)
MCV: 99.1 fL (ref 79.5–101.0)
Monocytes Absolute: 0.3 10*3/uL (ref 0.1–0.9)
Monocytes Relative: 8 %
Neutro Abs: 1.3 10*3/uL — ABNORMAL LOW (ref 1.5–6.5)
Neutrophils Relative %: 40 %
Platelets: 65 10*3/uL — ABNORMAL LOW (ref 145–400)
RBC: 3.28 MIL/uL — ABNORMAL LOW (ref 3.70–5.45)
RDW: 14.6 % (ref 11.2–16.1)
WBC: 3.3 10*3/uL — ABNORMAL LOW (ref 3.9–10.3)

## 2017-02-17 MED ORDER — SODIUM CHLORIDE 0.9% FLUSH
10.0000 mL | INTRAVENOUS | Status: DC | PRN
  Administered 2017-02-17: 10 mL
  Filled 2017-02-17: qty 10

## 2017-02-17 MED ORDER — ACETAMINOPHEN 325 MG PO TABS
ORAL_TABLET | ORAL | Status: AC
  Filled 2017-02-17: qty 2

## 2017-02-17 MED ORDER — SODIUM CHLORIDE 0.9 % IV SOLN
462.4000 mg | Freq: Once | INTRAVENOUS | Status: AC
  Administered 2017-02-17: 460 mg via INTRAVENOUS
  Filled 2017-02-17: qty 46

## 2017-02-17 MED ORDER — HEPARIN SOD (PORK) LOCK FLUSH 100 UNIT/ML IV SOLN
500.0000 [IU] | Freq: Once | INTRAVENOUS | Status: AC | PRN
  Administered 2017-02-17: 500 [IU]
  Filled 2017-02-17: qty 5

## 2017-02-17 MED ORDER — DIPHENHYDRAMINE HCL 25 MG PO CAPS
50.0000 mg | ORAL_CAPSULE | Freq: Once | ORAL | Status: AC
  Administered 2017-02-17: 50 mg via ORAL

## 2017-02-17 MED ORDER — SODIUM CHLORIDE 0.9 % IV SOLN
Freq: Once | INTRAVENOUS | Status: AC
  Administered 2017-02-17: 09:00:00 via INTRAVENOUS
  Filled 2017-02-17: qty 5

## 2017-02-17 MED ORDER — TRASTUZUMAB CHEMO 150 MG IV SOLR
6.0000 mg/kg | Freq: Once | INTRAVENOUS | Status: AC
  Administered 2017-02-17: 378 mg via INTRAVENOUS
  Filled 2017-02-17: qty 18

## 2017-02-17 MED ORDER — DIPHENHYDRAMINE HCL 25 MG PO CAPS
ORAL_CAPSULE | ORAL | Status: AC
  Filled 2017-02-17: qty 2

## 2017-02-17 MED ORDER — PALONOSETRON HCL INJECTION 0.25 MG/5ML
0.2500 mg | Freq: Once | INTRAVENOUS | Status: AC
  Administered 2017-02-17: 0.25 mg via INTRAVENOUS

## 2017-02-17 MED ORDER — ACETAMINOPHEN 325 MG PO TABS
650.0000 mg | ORAL_TABLET | Freq: Once | ORAL | Status: AC
  Administered 2017-02-17: 650 mg via ORAL

## 2017-02-17 MED ORDER — PALONOSETRON HCL INJECTION 0.25 MG/5ML
INTRAVENOUS | Status: AC
  Filled 2017-02-17: qty 5

## 2017-02-17 MED ORDER — SODIUM CHLORIDE 0.9 % IV SOLN
Freq: Once | INTRAVENOUS | Status: AC
  Administered 2017-02-17: 08:00:00 via INTRAVENOUS

## 2017-02-17 MED ORDER — PERTUZUMAB CHEMO INJECTION 420 MG/14ML
420.0000 mg | Freq: Once | INTRAVENOUS | Status: AC
  Administered 2017-02-17: 420 mg via INTRAVENOUS
  Filled 2017-02-17: qty 14

## 2017-02-17 NOTE — Telephone Encounter (Signed)
No 1/17 los -  

## 2017-02-17 NOTE — Progress Notes (Signed)
Per Dr. Lindi Adie okay to treat pt with Plt of 65

## 2017-02-17 NOTE — Patient Instructions (Signed)
[facility] Discharge Instructions for Patients Receiving Chemotherapy  Today you received the following chemotherapy agents Herceptin, perjeta and Carboplatin  To help prevent nausea and vomiting after your treatment, we encourage you to take your nausea medication as directed   If you develop nausea and vomiting that is not controlled by your nausea medication, call the clinic.   BELOW ARE SYMPTOMS THAT SHOULD BE REPORTED IMMEDIATELY:  *FEVER GREATER THAN 100.5 F  *CHILLS WITH OR WITHOUT FEVER  NAUSEA AND VOMITING THAT IS NOT CONTROLLED WITH YOUR NAUSEA MEDICATION  *UNUSUAL SHORTNESS OF BREATH  *UNUSUAL BRUISING OR BLEEDING  TENDERNESS IN MOUTH AND THROAT WITH OR WITHOUT PRESENCE OF ULCERS  *URINARY PROBLEMS  *BOWEL PROBLEMS  UNUSUAL RASH Items with * indicate a potential emergency and should be followed up as soon as possible.  Feel free to call the clinic should you have any questions or concerns. The clinic phone number is [phone].  Please show the [facility] at check-in to the Emergency Department and triage nurse.

## 2017-02-20 ENCOUNTER — Ambulatory Visit (HOSPITAL_COMMUNITY)
Admission: RE | Admit: 2017-02-20 | Discharge: 2017-02-20 | Disposition: A | Discharge status: Home / Self Care | Payer: 59 | Source: Ambulatory Visit | Attending: Hematology and Oncology | Admitting: Hematology and Oncology

## 2017-02-20 DIAGNOSIS — C50512 Malignant neoplasm of lower-outer quadrant of left female breast: Secondary | ICD-10-CM | POA: Diagnosis not present

## 2017-02-20 DIAGNOSIS — N6489 Other specified disorders of breast: Secondary | ICD-10-CM | POA: Diagnosis not present

## 2017-02-20 DIAGNOSIS — Z171 Estrogen receptor negative status [ER-]: Secondary | ICD-10-CM | POA: Insufficient documentation

## 2017-02-20 MED ORDER — GADOBENATE DIMEGLUMINE 529 MG/ML IV SOLN
14.0000 mL | Freq: Once | INTRAVENOUS | Status: AC | PRN
  Administered 2017-02-20: 14 mL via INTRAVENOUS

## 2017-02-21 DIAGNOSIS — C50819 Malignant neoplasm of overlapping sites of unspecified female breast: Secondary | ICD-10-CM | POA: Diagnosis not present

## 2017-02-22 DIAGNOSIS — Z171 Estrogen receptor negative status [ER-]: Secondary | ICD-10-CM | POA: Diagnosis not present

## 2017-02-22 DIAGNOSIS — C50512 Malignant neoplasm of lower-outer quadrant of left female breast: Secondary | ICD-10-CM | POA: Diagnosis not present

## 2017-02-24 ENCOUNTER — Inpatient Hospital Stay: Payer: 59

## 2017-02-24 DIAGNOSIS — C50512 Malignant neoplasm of lower-outer quadrant of left female breast: Secondary | ICD-10-CM

## 2017-02-24 DIAGNOSIS — Z171 Estrogen receptor negative status [ER-]: Secondary | ICD-10-CM

## 2017-02-24 LAB — COMPREHENSIVE METABOLIC PANEL
ALT: 28 U/L (ref 0–55)
AST: 35 U/L — ABNORMAL HIGH (ref 5–34)
Albumin: 3.9 g/dL (ref 3.5–5.0)
Alkaline Phosphatase: 94 U/L (ref 40–150)
Anion gap: 6 (ref 3–11)
BUN: 15 mg/dL (ref 7–26)
CO2: 31 mmol/L — ABNORMAL HIGH (ref 22–29)
Calcium: 9.2 mg/dL (ref 8.4–10.4)
Chloride: 101 mmol/L (ref 98–109)
Creatinine, Ser: 0.81 mg/dL (ref 0.60–1.10)
GFR calc Af Amer: >60 mL/min (ref >60)
GFR calc non Af Amer: >60 mL/min (ref >60)
Glucose, Bld: 82 mg/dL (ref 70–140)
Potassium: 4 mmol/L (ref 3.3–4.7)
Sodium: 138 mmol/L (ref 136–145)
Total Bilirubin: 0.4 mg/dL (ref 0.2–1.2)
Total Protein: 7.5 g/dL (ref 6.4–8.3)

## 2017-02-24 LAB — CBC WITH DIFFERENTIAL/PLATELET
Basophils Absolute: 0 10*3/uL (ref 0.0–0.1)
Basophils Relative: 0 %
Eosinophils Absolute: 0 10*3/uL (ref 0.0–0.5)
Eosinophils Relative: 1 %
HCT: 34.4 % — ABNORMAL LOW (ref 34.8–46.6)
Hemoglobin: 11.7 g/dL (ref 11.6–15.9)
Lymphocytes Relative: 55 %
Lymphs Abs: 2.1 10*3/uL (ref 0.9–3.3)
MCH: 34 pg (ref 25.1–34.0)
MCHC: 33.9 g/dL (ref 31.5–36.0)
MCV: 100.2 fL (ref 79.5–101.0)
Monocytes Absolute: 0.2 10*3/uL (ref 0.1–0.9)
Monocytes Relative: 4 %
Neutro Abs: 1.5 10*3/uL (ref 1.5–6.5)
Neutrophils Relative %: 40 %
Platelets: 73 10*3/uL — ABNORMAL LOW (ref 145–400)
RBC: 3.43 MIL/uL — ABNORMAL LOW (ref 3.70–5.45)
RDW: 14.4 % (ref 11.2–16.1)
WBC: 3.8 10*3/uL — ABNORMAL LOW (ref 3.9–10.3)

## 2017-03-01 ENCOUNTER — Other Ambulatory Visit: Payer: Self-pay | Admitting: General Surgery

## 2017-03-01 DIAGNOSIS — C50412 Malignant neoplasm of upper-outer quadrant of left female breast: Secondary | ICD-10-CM

## 2017-03-01 DIAGNOSIS — Z170 Estrogen receptor positive status [ER+]: Secondary | ICD-10-CM

## 2017-03-03 ENCOUNTER — Encounter: Payer: Self-pay | Admitting: Radiation Oncology

## 2017-03-03 ENCOUNTER — Other Ambulatory Visit: Payer: Self-pay | Admitting: *Deleted

## 2017-03-03 DIAGNOSIS — Z171 Estrogen receptor negative status [ER-]: Secondary | ICD-10-CM

## 2017-03-03 DIAGNOSIS — C50512 Malignant neoplasm of lower-outer quadrant of left female breast: Secondary | ICD-10-CM

## 2017-03-07 ENCOUNTER — Other Ambulatory Visit: Payer: Self-pay | Admitting: Hematology and Oncology

## 2017-03-09 ENCOUNTER — Telehealth: Payer: Self-pay | Admitting: Hematology and Oncology

## 2017-03-09 ENCOUNTER — Inpatient Hospital Stay: Payer: 59

## 2017-03-09 ENCOUNTER — Other Ambulatory Visit: Payer: Self-pay | Admitting: Adult Health

## 2017-03-09 ENCOUNTER — Inpatient Hospital Stay: Payer: 59 | Attending: Hematology and Oncology

## 2017-03-09 VITALS — BP 121/90 | HR 67 | Temp 98.6°F | Resp 18

## 2017-03-09 DIAGNOSIS — C50512 Malignant neoplasm of lower-outer quadrant of left female breast: Secondary | ICD-10-CM | POA: Diagnosis not present

## 2017-03-09 DIAGNOSIS — Z5112 Encounter for antineoplastic immunotherapy: Secondary | ICD-10-CM | POA: Diagnosis not present

## 2017-03-09 DIAGNOSIS — D701 Agranulocytosis secondary to cancer chemotherapy: Secondary | ICD-10-CM | POA: Insufficient documentation

## 2017-03-09 DIAGNOSIS — Z95828 Presence of other vascular implants and grafts: Secondary | ICD-10-CM

## 2017-03-09 DIAGNOSIS — Z171 Estrogen receptor negative status [ER-]: Secondary | ICD-10-CM

## 2017-03-09 LAB — CBC WITH DIFFERENTIAL/PLATELET
Basophils Absolute: 0 10*3/uL (ref 0.0–0.1)
Basophils Relative: 0 %
Eosinophils Absolute: 0 10*3/uL (ref 0.0–0.5)
Eosinophils Relative: 0 %
HCT: 31.8 % — ABNORMAL LOW (ref 34.8–46.6)
Hemoglobin: 11 g/dL — ABNORMAL LOW (ref 11.6–15.9)
Lymphocytes Relative: 69 %
Lymphs Abs: 1.7 10*3/uL (ref 0.9–3.3)
MCH: 34.5 pg — ABNORMAL HIGH (ref 25.1–34.0)
MCHC: 34.6 g/dL (ref 31.5–36.0)
MCV: 99.7 fL (ref 79.5–101.0)
Monocytes Absolute: 0.3 10*3/uL (ref 0.1–0.9)
Monocytes Relative: 13 %
Neutro Abs: 0.4 10*3/uL — CL (ref 1.5–6.5)
Neutrophils Relative %: 18 %
Platelets: 122 10*3/uL — ABNORMAL LOW (ref 145–400)
RBC: 3.19 MIL/uL — ABNORMAL LOW (ref 3.70–5.45)
RDW: 13.6 % (ref 11.2–14.5)
WBC: 2.5 10*3/uL — ABNORMAL LOW (ref 3.9–10.3)

## 2017-03-09 LAB — COMPREHENSIVE METABOLIC PANEL
ALT: 15 U/L (ref 0–55)
AST: 20 U/L (ref 5–34)
Albumin: 3.7 g/dL (ref 3.5–5.0)
Alkaline Phosphatase: 85 U/L (ref 40–150)
Anion gap: 7 (ref 3–11)
BUN: 14 mg/dL (ref 7–26)
CO2: 28 mmol/L (ref 22–29)
Calcium: 8.9 mg/dL (ref 8.4–10.4)
Chloride: 104 mmol/L (ref 98–109)
Creatinine, Ser: 0.75 mg/dL (ref 0.60–1.10)
GFR calc Af Amer: >60 mL/min (ref >60)
GFR calc non Af Amer: >60 mL/min (ref >60)
Glucose, Bld: 87 mg/dL (ref 70–140)
Potassium: 4 mmol/L (ref 3.5–5.1)
Sodium: 139 mmol/L (ref 136–145)
Total Bilirubin: <0.2 mg/dL — ABNORMAL LOW (ref 0.2–1.2)
Total Protein: 6.9 g/dL (ref 6.4–8.3)

## 2017-03-09 MED ORDER — HEPARIN SOD (PORK) LOCK FLUSH 100 UNIT/ML IV SOLN
500.0000 [IU] | Freq: Once | INTRAVENOUS | Status: AC | PRN
  Administered 2017-03-09: 500 [IU]
  Filled 2017-03-09: qty 5

## 2017-03-09 MED ORDER — TRASTUZUMAB CHEMO 150 MG IV SOLR
6.0000 mg/kg | Freq: Once | INTRAVENOUS | Status: AC
  Administered 2017-03-09: 378 mg via INTRAVENOUS
  Filled 2017-03-09: qty 18

## 2017-03-09 MED ORDER — DIPHENHYDRAMINE HCL 25 MG PO CAPS
ORAL_CAPSULE | ORAL | Status: AC
  Filled 2017-03-09: qty 2

## 2017-03-09 MED ORDER — SODIUM CHLORIDE 0.9% FLUSH
10.0000 mL | INTRAVENOUS | Status: DC | PRN
  Administered 2017-03-09: 10 mL via INTRAVENOUS
  Filled 2017-03-09: qty 10

## 2017-03-09 MED ORDER — DIPHENHYDRAMINE HCL 25 MG PO CAPS
50.0000 mg | ORAL_CAPSULE | Freq: Once | ORAL | Status: AC
  Administered 2017-03-09: 50 mg via ORAL

## 2017-03-09 MED ORDER — ACETAMINOPHEN 325 MG PO TABS
650.0000 mg | ORAL_TABLET | Freq: Once | ORAL | Status: AC
  Administered 2017-03-09: 650 mg via ORAL

## 2017-03-09 MED ORDER — SODIUM CHLORIDE 0.9 % IV SOLN
Freq: Once | INTRAVENOUS | Status: AC
  Administered 2017-03-09: 10:00:00 via INTRAVENOUS

## 2017-03-09 MED ORDER — SODIUM CHLORIDE 0.9% FLUSH
10.0000 mL | INTRAVENOUS | Status: DC | PRN
  Administered 2017-03-09: 10 mL
  Filled 2017-03-09: qty 10

## 2017-03-09 MED ORDER — ACETAMINOPHEN 325 MG PO TABS
ORAL_TABLET | ORAL | Status: AC
  Filled 2017-03-09: qty 2

## 2017-03-09 NOTE — Patient Instructions (Signed)
[facility] Discharge Instructions for Patients Receiving Chemotherapy  Today you received the following chemotherapy agent: Herceptin  To help prevent nausea and vomiting after your treatment, we encourage you to take your nausea medication as directed.   If you develop nausea and vomiting that is not controlled by your nausea medication, call the clinic.   BELOW ARE SYMPTOMS THAT SHOULD BE REPORTED IMMEDIATELY:  *FEVER GREATER THAN 100.5 F  *CHILLS WITH OR WITHOUT FEVER  NAUSEA AND VOMITING THAT IS NOT CONTROLLED WITH YOUR NAUSEA MEDICATION  *UNUSUAL SHORTNESS OF BREATH  *UNUSUAL BRUISING OR BLEEDING  TENDERNESS IN MOUTH AND THROAT WITH OR WITHOUT PRESENCE OF ULCERS  *URINARY PROBLEMS  *BOWEL PROBLEMS  UNUSUAL RASH Items with * indicate a potential emergency and should be followed up as soon as possible.  Feel free to call the clinic should you have any questions or concerns. The clinic phone number is [phone].  Please show the CHEMO ALERT CARD at check-in to the Emergency Department and triage nurse.   

## 2017-03-09 NOTE — Progress Notes (Signed)
Per MD Lindi Adie RN May giving only Herceptin today.  Pharmacy aware.

## 2017-03-09 NOTE — Progress Notes (Addendum)
Per Dr. Geralyn Flash note, pt okay to receive Herceptin only today. Confirmed with Lindsey,NP and Pharmacy.   Panic ANC 0.4 was reported from lab. Notified Lindsey,NP since Dr.Gudena is out of the office today. Pt has not had any neulasta onpro since Cycle 5 TCHP. Per Mendel Ryder, pt may receive neupogen shots this week if pt agreeable. Per Infusion RN, pt would like to receive neupogen shots and is aware that she will need to come consecutive days for it.   Will send urgent message to scheduling for pt to come back tomorrow and Saturday for neupogen injection. Orders will be placed under supportive treatment.

## 2017-03-09 NOTE — Telephone Encounter (Signed)
Patient stopped by scheduling regarding needed injections for the next 3 days. Per 2/7 schedule message added injections for 2/8 and 2/9. Per desk nurse added an additional injection for Monday 2/11. Patient given calendar for February and March.

## 2017-03-10 ENCOUNTER — Inpatient Hospital Stay: Payer: 59

## 2017-03-10 VITALS — BP 120/74 | HR 78 | Temp 98.4°F | Resp 20

## 2017-03-10 DIAGNOSIS — Z95828 Presence of other vascular implants and grafts: Secondary | ICD-10-CM

## 2017-03-10 DIAGNOSIS — Z5112 Encounter for antineoplastic immunotherapy: Secondary | ICD-10-CM | POA: Diagnosis not present

## 2017-03-10 MED ORDER — TBO-FILGRASTIM 300 MCG/0.5ML ~~LOC~~ SOSY
300.0000 ug | PREFILLED_SYRINGE | Freq: Once | SUBCUTANEOUS | Status: AC
  Administered 2017-03-10: 300 ug via SUBCUTANEOUS

## 2017-03-10 MED ORDER — TBO-FILGRASTIM 300 MCG/0.5ML ~~LOC~~ SOSY
PREFILLED_SYRINGE | SUBCUTANEOUS | Status: AC
  Filled 2017-03-10: qty 0.5

## 2017-03-10 NOTE — Patient Instructions (Signed)
Tbo-Filgrastim injection What is this medicine? TBO-FILGRASTIM (T B O fil GRA stim) is a granulocyte colony-stimulating factor that stimulates the growth of neutrophils, a type of white blood cell important in the body's fight against infection. It is used to reduce the incidence of fever and infection in patients with certain types of cancer who are receiving chemotherapy that affects the bone marrow. This medicine may be used for other purposes; ask your health care provider or pharmacist if you have questions. COMMON BRAND NAME(S): Granix What should I tell my health care provider before I take this medicine? They need to know if you have any of these conditions: -bone scan or tests planned -kidney disease -sickle cell anemia -an unusual or allergic reaction to tbo-filgrastim, filgrastim, pegfilgrastim, other medicines, foods, dyes, or preservatives -pregnant or trying to get pregnant -breast-feeding How should I use this medicine? This medicine is for injection under the skin. If you get this medicine at home, you will be taught how to prepare and give this medicine. Refer to the Instructions for Use that come with your medication packaging. Use exactly as directed. Take your medicine at regular intervals. Do not take your medicine more often than directed. It is important that you put your used needles and syringes in a special sharps container. Do not put them in a trash can. If you do not have a sharps container, call your pharmacist or healthcare provider to get one. Talk to your pediatrician regarding the use of this medicine in children. Special care may be needed. Overdosage: If you think you have taken too much of this medicine contact a poison control center or emergency room at once. NOTE: This medicine is only for you. Do not share this medicine with others. What if I miss a dose? It is important not to miss your dose. Call your doctor or health care professional if you miss a  dose. What may interact with this medicine? This medicine may interact with the following medications: -medicines that may cause a release of neutrophils, such as lithium This list may not describe all possible interactions. Give your health care provider a list of all the medicines, herbs, non-prescription drugs, or dietary supplements you use. Also tell them if you smoke, drink alcohol, or use illegal drugs. Some items may interact with your medicine. What should I watch for while using this medicine? You may need blood work done while you are taking this medicine. What side effects may I notice from receiving this medicine? Side effects that you should report to your doctor or health care professional as soon as possible: -allergic reactions like skin rash, itching or hives, swelling of the face, lips, or tongue -blood in the urine -dark urine -dizziness -fast heartbeat -feeling faint -shortness of breath or breathing problems -signs and symptoms of infection like fever or chills; cough; or sore throat -signs and symptoms of kidney injury like trouble passing urine or change in the amount of urine -stomach or side pain, or pain at the shoulder -sweating -swelling of the legs, ankles, or abdomen -tiredness Side effects that usually do not require medical attention (report to your doctor or health care professional if they continue or are bothersome): -bone pain -headache -muscle pain -vomiting This list may not describe all possible side effects. Call your doctor for medical advice about side effects. You may report side effects to FDA at 1-800-FDA-1088. Where should I keep my medicine? Keep out of the reach of children. Store in a refrigerator between   2 and 8 degrees C (36 and 46 degrees F). Keep in carton to protect from light. Throw away this medicine if it is left out of the refrigerator for more than 5 consecutive days. Throw away any unused medicine after the expiration  date. NOTE: This sheet is a summary. It may not cover all possible information. If you have questions about this medicine, talk to your doctor, pharmacist, or health care provider.  2018 Elsevier/Gold Standard (2015-03-09 19:07:04)  

## 2017-03-11 ENCOUNTER — Inpatient Hospital Stay: Payer: 59

## 2017-03-11 VITALS — BP 119/83 | HR 70 | Temp 98.3°F | Resp 16

## 2017-03-11 DIAGNOSIS — Z5112 Encounter for antineoplastic immunotherapy: Secondary | ICD-10-CM | POA: Diagnosis not present

## 2017-03-11 DIAGNOSIS — Z95828 Presence of other vascular implants and grafts: Secondary | ICD-10-CM

## 2017-03-11 MED ORDER — TBO-FILGRASTIM 300 MCG/0.5ML ~~LOC~~ SOSY
300.0000 ug | PREFILLED_SYRINGE | Freq: Once | SUBCUTANEOUS | Status: AC
  Administered 2017-03-11: 300 ug via SUBCUTANEOUS

## 2017-03-11 MED ORDER — TBO-FILGRASTIM 480 MCG/0.8ML ~~LOC~~ SOSY
PREFILLED_SYRINGE | SUBCUTANEOUS | Status: AC
  Filled 2017-03-11: qty 0.8

## 2017-03-11 NOTE — Patient Instructions (Signed)
Tbo-Filgrastim injection What is this medicine? TBO-FILGRASTIM (T B O fil GRA stim) is a granulocyte colony-stimulating factor that stimulates the growth of neutrophils, a type of white blood cell important in the body's fight against infection. It is used to reduce the incidence of fever and infection in patients with certain types of cancer who are receiving chemotherapy that affects the bone marrow. This medicine may be used for other purposes; ask your health care provider or pharmacist if you have questions. COMMON BRAND NAME(S): Granix What should I tell my health care provider before I take this medicine? They need to know if you have any of these conditions: -bone scan or tests planned -kidney disease -sickle cell anemia -an unusual or allergic reaction to tbo-filgrastim, filgrastim, pegfilgrastim, other medicines, foods, dyes, or preservatives -pregnant or trying to get pregnant -breast-feeding How should I use this medicine? This medicine is for injection under the skin. If you get this medicine at home, you will be taught how to prepare and give this medicine. Refer to the Instructions for Use that come with your medication packaging. Use exactly as directed. Take your medicine at regular intervals. Do not take your medicine more often than directed. It is important that you put your used needles and syringes in a special sharps container. Do not put them in a trash can. If you do not have a sharps container, call your pharmacist or healthcare provider to get one. Talk to your pediatrician regarding the use of this medicine in children. Special care may be needed. Overdosage: If you think you have taken too much of this medicine contact a poison control center or emergency room at once. NOTE: This medicine is only for you. Do not share this medicine with others. What if I miss a dose? It is important not to miss your dose. Call your doctor or health care professional if you miss a  dose. What may interact with this medicine? This medicine may interact with the following medications: -medicines that may cause a release of neutrophils, such as lithium This list may not describe all possible interactions. Give your health care provider a list of all the medicines, herbs, non-prescription drugs, or dietary supplements you use. Also tell them if you smoke, drink alcohol, or use illegal drugs. Some items may interact with your medicine. What should I watch for while using this medicine? You may need blood work done while you are taking this medicine. What side effects may I notice from receiving this medicine? Side effects that you should report to your doctor or health care professional as soon as possible: -allergic reactions like skin rash, itching or hives, swelling of the face, lips, or tongue -blood in the urine -dark urine -dizziness -fast heartbeat -feeling faint -shortness of breath or breathing problems -signs and symptoms of infection like fever or chills; cough; or sore throat -signs and symptoms of kidney injury like trouble passing urine or change in the amount of urine -stomach or side pain, or pain at the shoulder -sweating -swelling of the legs, ankles, or abdomen -tiredness Side effects that usually do not require medical attention (report to your doctor or health care professional if they continue or are bothersome): -bone pain -headache -muscle pain -vomiting This list may not describe all possible side effects. Call your doctor for medical advice about side effects. You may report side effects to FDA at 1-800-FDA-1088. Where should I keep my medicine? Keep out of the reach of children. Store in a refrigerator between   2 and 8 degrees C (36 and 46 degrees F). Keep in carton to protect from light. Throw away this medicine if it is left out of the refrigerator for more than 5 consecutive days. Throw away any unused medicine after the expiration  date. NOTE: This sheet is a summary. It may not cover all possible information. If you have questions about this medicine, talk to your doctor, pharmacist, or health care provider.  2018 Elsevier/Gold Standard (2015-03-09 19:07:04)  

## 2017-03-13 ENCOUNTER — Inpatient Hospital Stay: Payer: 59

## 2017-03-13 VITALS — BP 114/82 | HR 87 | Temp 98.6°F | Resp 18

## 2017-03-13 DIAGNOSIS — Z95828 Presence of other vascular implants and grafts: Secondary | ICD-10-CM

## 2017-03-13 DIAGNOSIS — Z5112 Encounter for antineoplastic immunotherapy: Secondary | ICD-10-CM | POA: Diagnosis not present

## 2017-03-13 MED ORDER — TBO-FILGRASTIM 300 MCG/0.5ML ~~LOC~~ SOSY
PREFILLED_SYRINGE | SUBCUTANEOUS | Status: AC
  Filled 2017-03-13: qty 0.5

## 2017-03-13 MED ORDER — TBO-FILGRASTIM 300 MCG/0.5ML ~~LOC~~ SOSY
300.0000 ug | PREFILLED_SYRINGE | Freq: Once | SUBCUTANEOUS | Status: AC
  Administered 2017-03-13: 300 ug via SUBCUTANEOUS

## 2017-03-13 NOTE — Patient Instructions (Signed)
Tbo-Filgrastim injection What is this medicine? TBO-FILGRASTIM (T B O fil GRA stim) is a granulocyte colony-stimulating factor that stimulates the growth of neutrophils, a type of white blood cell important in the body's fight against infection. It is used to reduce the incidence of fever and infection in patients with certain types of cancer who are receiving chemotherapy that affects the bone marrow. This medicine may be used for other purposes; ask your health care provider or pharmacist if you have questions. COMMON BRAND NAME(S): Granix What should I tell my health care provider before I take this medicine? They need to know if you have any of these conditions: -bone scan or tests planned -kidney disease -sickle cell anemia -an unusual or allergic reaction to tbo-filgrastim, filgrastim, pegfilgrastim, other medicines, foods, dyes, or preservatives -pregnant or trying to get pregnant -breast-feeding How should I use this medicine? This medicine is for injection under the skin. If you get this medicine at home, you will be taught how to prepare and give this medicine. Refer to the Instructions for Use that come with your medication packaging. Use exactly as directed. Take your medicine at regular intervals. Do not take your medicine more often than directed. It is important that you put your used needles and syringes in a special sharps container. Do not put them in a trash can. If you do not have a sharps container, call your pharmacist or healthcare provider to get one. Talk to your pediatrician regarding the use of this medicine in children. Special care may be needed. Overdosage: If you think you have taken too much of this medicine contact a poison control center or emergency room at once. NOTE: This medicine is only for you. Do not share this medicine with others. What if I miss a dose? It is important not to miss your dose. Call your doctor or health care professional if you miss a  dose. What may interact with this medicine? This medicine may interact with the following medications: -medicines that may cause a release of neutrophils, such as lithium This list may not describe all possible interactions. Give your health care provider a list of all the medicines, herbs, non-prescription drugs, or dietary supplements you use. Also tell them if you smoke, drink alcohol, or use illegal drugs. Some items may interact with your medicine. What should I watch for while using this medicine? You may need blood work done while you are taking this medicine. What side effects may I notice from receiving this medicine? Side effects that you should report to your doctor or health care professional as soon as possible: -allergic reactions like skin rash, itching or hives, swelling of the face, lips, or tongue -blood in the urine -dark urine -dizziness -fast heartbeat -feeling faint -shortness of breath or breathing problems -signs and symptoms of infection like fever or chills; cough; or sore throat -signs and symptoms of kidney injury like trouble passing urine or change in the amount of urine -stomach or side pain, or pain at the shoulder -sweating -swelling of the legs, ankles, or abdomen -tiredness Side effects that usually do not require medical attention (report to your doctor or health care professional if they continue or are bothersome): -bone pain -headache -muscle pain -vomiting This list may not describe all possible side effects. Call your doctor for medical advice about side effects. You may report side effects to FDA at 1-800-FDA-1088. Where should I keep my medicine? Keep out of the reach of children. Store in a refrigerator between   2 and 8 degrees C (36 and 46 degrees F). Keep in carton to protect from light. Throw away this medicine if it is left out of the refrigerator for more than 5 consecutive days. Throw away any unused medicine after the expiration  date. NOTE: This sheet is a summary. It may not cover all possible information. If you have questions about this medicine, talk to your doctor, pharmacist, or health care provider.  2018 Elsevier/Gold Standard (2015-03-09 19:07:04)  

## 2017-03-15 ENCOUNTER — Telehealth: Payer: Self-pay | Admitting: Hematology and Oncology

## 2017-03-15 NOTE — Telephone Encounter (Signed)
Mailed patient calendar of upcoming March appointment updates per 2/12 sch message

## 2017-03-17 ENCOUNTER — Encounter (HOSPITAL_BASED_OUTPATIENT_CLINIC_OR_DEPARTMENT_OTHER): Payer: Self-pay | Admitting: *Deleted

## 2017-03-17 ENCOUNTER — Other Ambulatory Visit: Payer: Self-pay

## 2017-03-20 NOTE — H&P (Signed)
Subjective:     Patient ID: Laura Anthony is a 42 y.o. female.  HPI  Patient of Drs. Donne Hazel and Canute here for consultation breast reconstruction. Presented with palpable mass left breast. Diagnostic MMG identified multiple lesions in the left breast. [place] noted to have 1.7 cm mass at 4:00 position, 5 mm mass 6:30 position an 11 mm mass at 6:00 position along with an enlarged LN. Biopsy of breast lesions with IDC, ER/PR -and HER-2 +. LN positive for metastatic disease.   Completed neoadjuvant chemotherapy. Will receive Herceptin/Perjeta for 1 year. Final MRI with no residual enhancement  Genetics negative. Mother with breast ca.  She has discussed NSM with Dr. Donne Hazel and plan for bilateral mastectomies, targeted LN dissection.   Of note patient with history DVT 2017- completed Eliquis. States negative hypercoaguability w/u, though due to OCP use.  Current 36 B cup, desires what she was prior to pregnancy, a C cup. Wt up 15 lb since start chemotherapy.  Now at home with family, previously worked as Librarian, academic for [employer]. Mother has passed , had breast cancer and underwent SSM with TE and implant based reconstruction.  Review of Systems  Constitutional: Positive for fatigue.  Neurological: Positive for numbness.   Remainder 12 point review negative    Objective:   Physical Exam  Constitutional: She is oriented to person, place, and time.  Cardiovascular: Normal rate. Normal heart sounds Pulmonary/Chest: Effort normal. Normal breath sounds Abdominal: Soft.  Lymphadenopathy:    She has no axillary adenopathy.  Neurological: She is alert and oriented to person, place, and time.  Skin:  Fitzpatrick 2  no masses felt  No ptosis SN to nipple R 21 L 21 cm BW R 16 L 16 cm (CW 13 cm) Nipple to IMF R 8 L 8 cm     Assessment:     Left breast ca LOQ ER-, Her2+ Neoadjuvant chemotherapy Family history breast ca    Plan:      Plan bilateral NSM  with immediate TE, ADM reconstruction. Reviewed incisions, drains, OR length, hospital stay and post operative limitations. Discussed process of expansion and implant based risks including rupture, surveillance for silicone implants, infection requiring surgery or removal, contracture. Reviewed NSM will be asensate and not stimulate. Reviewed with risks mastectomy flap necrosis requiring additional surgery.  Discussed use of acellular dermis in reconstruction, cadaveric source, incorporation over several weeks, risk that if has seroma or infection can act as additional nidus for infection if not incorporated.  Discussed prepectoral vs sub pectoral reconstruction. Discussed with patient and benefit of this is no animation deformity, may be less pain. There is also reported benefit in radiated patients with less radiation changes to flap, reported similar risk profile. Risk may be more visible rippling over upper poles, greater need of ADM. Reviewed pre pectoral would require larger amount acellular dermis, more drains. Discussed any type reconstruction also risks long term displacement implant and visible rippling. If prepectoral counseled I would recommend she be comfortable with silicone implants as more options that have less rippling. She agrees to prepectoral placement.  Discussed future surgery dependent on adjuvant treatments. This includes radiation- discussed this significantly increases risk reconstruction including wound healing problems capsular contracture. Options would be to delay reconstruction and pursue LD + TE on radiated side TE alone if no radiation or or DIEP. Alternative would be placement expanders at time of mastectomy- the benefit of this would be to retain breast footprint. However this would then mean expanders would  be in place for several months (approximately 6 months from end of radiation) before continuing reconstruction process.  Reviewed reconstruction will be asensate  and not stimulate. Reviewed additional risks including but not limited to risks mastectomy flap necrosis requiring additional surgery, seroma, hematoma, asymmetry, need to additional procedures, fat necrosis, DVT/PE, damage to adjacent structures, cardiopulmonary complications.  Irene Limbo, MD [facility] Plastic [facility] Re[facility] Surgery [phone], pin [phone]

## 2017-03-23 ENCOUNTER — Encounter (HOSPITAL_BASED_OUTPATIENT_CLINIC_OR_DEPARTMENT_OTHER)
Admission: RE | Admit: 2017-03-23 | Discharge: 2017-03-23 | Disposition: A | Discharge status: Home / Self Care | Payer: 59 | Source: Ambulatory Visit | Attending: General Surgery | Admitting: General Surgery

## 2017-03-23 DIAGNOSIS — C50512 Malignant neoplasm of lower-outer quadrant of left female breast: Secondary | ICD-10-CM | POA: Insufficient documentation

## 2017-03-23 DIAGNOSIS — Z170 Estrogen receptor positive status [ER+]: Secondary | ICD-10-CM | POA: Insufficient documentation

## 2017-03-23 DIAGNOSIS — Z01812 Encounter for preprocedural laboratory examination: Secondary | ICD-10-CM | POA: Diagnosis not present

## 2017-03-23 LAB — CBC WITH DIFFERENTIAL/PLATELET
Basophils Absolute: 0 10*3/uL (ref 0.0–0.1)
Basophils Relative: 0 %
Eosinophils Absolute: 0 10*3/uL (ref 0.0–0.7)
Eosinophils Relative: 0 %
HCT: 35.4 % — ABNORMAL LOW (ref 36.0–46.0)
Hemoglobin: 12 g/dL (ref 12.0–15.0)
Lymphocytes Relative: 53 %
Lymphs Abs: 2.3 10*3/uL (ref 0.7–4.0)
MCH: 33.8 pg (ref 26.0–34.0)
MCHC: 33.9 g/dL (ref 30.0–36.0)
MCV: 99.7 fL (ref 78.0–100.0)
Monocytes Absolute: 0.3 10*3/uL (ref 0.1–1.0)
Monocytes Relative: 7 %
Neutro Abs: 1.8 10*3/uL (ref 1.7–7.7)
Neutrophils Relative %: 40 %
Platelets: 232 10*3/uL (ref 150–400)
RBC: 3.55 MIL/uL — ABNORMAL LOW (ref 3.87–5.11)
RDW: 13.2 % (ref 11.5–15.5)
WBC: 4.5 10*3/uL (ref 4.0–10.5)

## 2017-03-23 NOTE — Progress Notes (Signed)
Ensure pre surgery drink given with instructions to complete by 0400 dos, surgical scrub given with instructions, pt verbalized understanding.

## 2017-03-24 ENCOUNTER — Other Ambulatory Visit: Payer: Self-pay | Admitting: General Surgery

## 2017-03-24 ENCOUNTER — Ambulatory Visit
Admission: RE | Admit: 2017-03-24 | Discharge: 2017-03-24 | Disposition: A | Discharge status: Home / Self Care | Payer: 59 | Source: Ambulatory Visit | Attending: General Surgery | Admitting: General Surgery

## 2017-03-24 DIAGNOSIS — Z170 Estrogen receptor positive status [ER+]: Secondary | ICD-10-CM

## 2017-03-24 DIAGNOSIS — C50412 Malignant neoplasm of upper-outer quadrant of left female breast: Secondary | ICD-10-CM

## 2017-03-24 DIAGNOSIS — R590 Localized enlarged lymph nodes: Secondary | ICD-10-CM | POA: Diagnosis not present

## 2017-03-27 ENCOUNTER — Ambulatory Visit (HOSPITAL_BASED_OUTPATIENT_CLINIC_OR_DEPARTMENT_OTHER): Payer: 59 | Admitting: Anesthesiology

## 2017-03-27 ENCOUNTER — Encounter (HOSPITAL_COMMUNITY)
Admission: RE | Disposition: A | Discharge status: Home / Self Care | Payer: Self-pay | Source: Ambulatory Visit | Attending: General Surgery

## 2017-03-27 ENCOUNTER — Other Ambulatory Visit: Payer: Self-pay

## 2017-03-27 ENCOUNTER — Observation Stay (HOSPITAL_BASED_OUTPATIENT_CLINIC_OR_DEPARTMENT_OTHER)
Admission: RE | Admit: 2017-03-27 | Discharge: 2017-03-29 | Disposition: A | Discharge status: Home / Self Care | Payer: 59 | Source: Ambulatory Visit | Attending: General Surgery | Admitting: General Surgery

## 2017-03-27 ENCOUNTER — Encounter (HOSPITAL_BASED_OUTPATIENT_CLINIC_OR_DEPARTMENT_OTHER): Payer: Self-pay

## 2017-03-27 ENCOUNTER — Ambulatory Visit
Admission: RE | Admit: 2017-03-27 | Discharge: 2017-03-27 | Disposition: A | Discharge status: Home / Self Care | Payer: 59 | Source: Ambulatory Visit | Attending: General Surgery | Admitting: General Surgery

## 2017-03-27 ENCOUNTER — Encounter (HOSPITAL_COMMUNITY)
Admission: RE | Admit: 2017-03-27 | Discharge: 2017-03-27 | Disposition: A | Discharge status: Home / Self Care | Payer: 59 | Source: Ambulatory Visit | Attending: General Surgery | Admitting: General Surgery

## 2017-03-27 DIAGNOSIS — Z86718 Personal history of other venous thrombosis and embolism: Secondary | ICD-10-CM | POA: Diagnosis not present

## 2017-03-27 DIAGNOSIS — C50912 Malignant neoplasm of unspecified site of left female breast: Secondary | ICD-10-CM | POA: Diagnosis present

## 2017-03-27 DIAGNOSIS — C50512 Malignant neoplasm of lower-outer quadrant of left female breast: Secondary | ICD-10-CM | POA: Diagnosis not present

## 2017-03-27 DIAGNOSIS — Z9221 Personal history of antineoplastic chemotherapy: Secondary | ICD-10-CM | POA: Diagnosis not present

## 2017-03-27 DIAGNOSIS — C50812 Malignant neoplasm of overlapping sites of left female breast: Secondary | ICD-10-CM | POA: Diagnosis not present

## 2017-03-27 DIAGNOSIS — Z803 Family history of malignant neoplasm of breast: Secondary | ICD-10-CM | POA: Diagnosis not present

## 2017-03-27 DIAGNOSIS — Z421 Encounter for breast reconstruction following mastectomy: Secondary | ICD-10-CM | POA: Diagnosis not present

## 2017-03-27 DIAGNOSIS — C50412 Malignant neoplasm of upper-outer quadrant of left female breast: Secondary | ICD-10-CM

## 2017-03-27 DIAGNOSIS — D241 Benign neoplasm of right breast: Secondary | ICD-10-CM | POA: Insufficient documentation

## 2017-03-27 DIAGNOSIS — N6021 Fibroadenosis of right breast: Secondary | ICD-10-CM | POA: Insufficient documentation

## 2017-03-27 DIAGNOSIS — N6011 Diffuse cystic mastopathy of right breast: Secondary | ICD-10-CM | POA: Diagnosis not present

## 2017-03-27 DIAGNOSIS — Z171 Estrogen receptor negative status [ER-]: Secondary | ICD-10-CM | POA: Insufficient documentation

## 2017-03-27 DIAGNOSIS — Z170 Estrogen receptor positive status [ER+]: Secondary | ICD-10-CM

## 2017-03-27 DIAGNOSIS — R590 Localized enlarged lymph nodes: Secondary | ICD-10-CM | POA: Diagnosis not present

## 2017-03-27 DIAGNOSIS — Z853 Personal history of malignant neoplasm of breast: Secondary | ICD-10-CM | POA: Diagnosis not present

## 2017-03-27 DIAGNOSIS — G8918 Other acute postprocedural pain: Secondary | ICD-10-CM | POA: Diagnosis not present

## 2017-03-27 HISTORY — PX: RADIOACTIVE SEED GUIDED AXILLARY SENTINEL LYMPH NODE: SHX6735

## 2017-03-27 HISTORY — PX: NIPPLE SPARING MASTECTOMY: SHX6537

## 2017-03-27 HISTORY — PX: RECONSTRUCTION BREAST IMMEDIATE / DELAYED W/ TISSUE EXPANDER: SUR1077

## 2017-03-27 HISTORY — DX: Anemia, unspecified: D64.9

## 2017-03-27 HISTORY — DX: Pure hypercholesterolemia, unspecified: E78.00

## 2017-03-27 HISTORY — DX: Malignant neoplasm of unspecified site of left female breast: C50.912

## 2017-03-27 HISTORY — PX: MASTECTOMY: SHX3

## 2017-03-27 HISTORY — DX: Migraine, unspecified, not intractable, without status migrainosus: G43.909

## 2017-03-27 LAB — POCT I-STAT, CHEM 8
BUN: 15 mg/dL (ref 6–20)
BUN: 13 mg/dL (ref 6–20)
Calcium, Ion: 1.11 mmol/L — ABNORMAL LOW (ref 1.15–1.40)
Calcium, Ion: 1.15 mmol/L (ref 1.15–1.40)
Chloride: 99 mmol/L — ABNORMAL LOW (ref 101–111)
Chloride: 100 mmol/L — ABNORMAL LOW (ref 101–111)
Creatinine, Ser: 0.7 mg/dL (ref 0.44–1.00)
Creatinine, Ser: 0.7 mg/dL (ref 0.44–1.00)
Glucose, Bld: 175 mg/dL — ABNORMAL HIGH (ref 65–99)
Glucose, Bld: 209 mg/dL — ABNORMAL HIGH (ref 65–99)
HCT: 23 % — ABNORMAL LOW (ref 36.0–46.0)
HCT: 22 % — ABNORMAL LOW (ref 36.0–46.0)
Hemoglobin: 7.8 g/dL — ABNORMAL LOW (ref 12.0–15.0)
Hemoglobin: 7.5 g/dL — ABNORMAL LOW (ref 12.0–15.0)
Potassium: 3.6 mmol/L (ref 3.5–5.1)
Potassium: 4.2 mmol/L (ref 3.5–5.1)
Sodium: 137 mmol/L (ref 135–145)
Sodium: 139 mmol/L (ref 135–145)
TCO2: 24 mmol/L (ref 22–32)
TCO2: 23 mmol/L (ref 22–32)

## 2017-03-27 SURGERY — RADIOACTIVE SEED GUIDED AXILLARY SENTINEL LYMPH NODE BIOPSY
Anesthesia: General | Site: Breast | Laterality: Right

## 2017-03-27 MED ORDER — SODIUM CHLORIDE 0.9 % IV SOLN
INTRAVENOUS | Status: DC | PRN
  Administered 2017-03-27: 1000 mL

## 2017-03-27 MED ORDER — EPHEDRINE 5 MG/ML INJ
INTRAVENOUS | Status: AC
  Filled 2017-03-27: qty 10

## 2017-03-27 MED ORDER — ALBUMIN HUMAN 5 % IV SOLN
12.5000 g | Freq: Once | INTRAVENOUS | Status: AC
  Administered 2017-03-27: 12.5 g via INTRAVENOUS

## 2017-03-27 MED ORDER — ZOLPIDEM TARTRATE 5 MG PO TABS
5.0000 mg | ORAL_TABLET | Freq: Every evening | ORAL | Status: DC | PRN
  Administered 2017-03-27 – 2017-03-29 (×2): 5 mg via ORAL
  Filled 2017-03-27 (×2): qty 1

## 2017-03-27 MED ORDER — FENTANYL CITRATE (PF) 100 MCG/2ML IJ SOLN
INTRAMUSCULAR | Status: AC
  Filled 2017-03-27: qty 2

## 2017-03-27 MED ORDER — MORPHINE SULFATE (PF) 4 MG/ML IV SOLN
INTRAVENOUS | Status: AC
  Filled 2017-03-27: qty 1

## 2017-03-27 MED ORDER — MIDAZOLAM HCL 2 MG/2ML IJ SOLN
INTRAMUSCULAR | Status: AC
  Filled 2017-03-27: qty 2

## 2017-03-27 MED ORDER — ONDANSETRON 4 MG PO TBDP: 4.0000 mg | ORAL_TABLET | Freq: Four times a day (QID) | ORAL | Status: DC | PRN

## 2017-03-27 MED ORDER — SIMETHICONE 80 MG PO CHEW: 40.0000 mg | CHEWABLE_TABLET | Freq: Four times a day (QID) | ORAL | Status: DC | PRN

## 2017-03-27 MED ORDER — CEFAZOLIN SODIUM-DEXTROSE 2-4 GM/100ML-% IV SOLN
2.0000 g | INTRAVENOUS | Status: AC
  Administered 2017-03-27 (×2): 2 g via INTRAVENOUS

## 2017-03-27 MED ORDER — GABAPENTIN 300 MG PO CAPS
300.0000 mg | ORAL_CAPSULE | ORAL | Status: AC
  Administered 2017-03-27: 300 mg via ORAL

## 2017-03-27 MED ORDER — SODIUM CHLORIDE 0.9 % IV SOLN
INTRAVENOUS | Status: DC
  Administered 2017-03-27: 17:00:00 via INTRAVENOUS
  Administered 2017-03-27: 50 mL/h via INTRAVENOUS

## 2017-03-27 MED ORDER — ALBUMIN HUMAN 5 % IV SOLN
INTRAVENOUS | Status: AC
  Filled 2017-03-27: qty 250

## 2017-03-27 MED ORDER — FLUOXETINE HCL 20 MG PO CAPS
20.0000 mg | ORAL_CAPSULE | ORAL | Status: DC
  Administered 2017-03-28 – 2017-03-29 (×2): 20 mg via ORAL
  Filled 2017-03-27: qty 1

## 2017-03-27 MED ORDER — ROCURONIUM BROMIDE 10 MG/ML (PF) SYRINGE
PREFILLED_SYRINGE | INTRAVENOUS | Status: AC
  Filled 2017-03-27: qty 5

## 2017-03-27 MED ORDER — ACETAMINOPHEN 650 MG RE SUPP: 650.0000 mg | Freq: Four times a day (QID) | RECTAL | Status: DC | PRN

## 2017-03-27 MED ORDER — BUPIVACAINE-EPINEPHRINE 0.25% -1:200000 IJ SOLN
INTRAMUSCULAR | Status: AC
  Filled 2017-03-27: qty 1

## 2017-03-27 MED ORDER — ACETAMINOPHEN 500 MG PO TABS
ORAL_TABLET | ORAL | Status: AC
  Filled 2017-03-27: qty 2

## 2017-03-27 MED ORDER — MORPHINE SULFATE (PF) 2 MG/ML IV SOLN
2.0000 mg | INTRAVENOUS | Status: DC | PRN
  Administered 2017-03-27 – 2017-03-28 (×4): 2 mg via INTRAVENOUS

## 2017-03-27 MED ORDER — MIDODRINE HCL 5 MG PO TABS
10.0000 mg | ORAL_TABLET | Freq: Once | ORAL | Status: AC
  Administered 2017-03-27: 10 mg via ORAL
  Filled 2017-03-27: qty 2

## 2017-03-27 MED ORDER — TECHNETIUM TC 99M SULFUR COLLOID FILTERED
1.0000 | Freq: Once | INTRAVENOUS | Status: AC | PRN
  Administered 2017-03-27: 1 via INTRADERMAL

## 2017-03-27 MED ORDER — TRAMADOL HCL 50 MG PO TABS: 50.0000 mg | ORAL_TABLET | Freq: Four times a day (QID) | ORAL | 0 refills | Status: DC | PRN

## 2017-03-27 MED ORDER — ENSURE PRE-SURGERY PO LIQD: 592.0000 mL | Freq: Once | ORAL | Status: DC

## 2017-03-27 MED ORDER — SODIUM CHLORIDE 0.9 % IJ SOLN
INTRAMUSCULAR | Status: AC
  Filled 2017-03-27: qty 10

## 2017-03-27 MED ORDER — DEXAMETHASONE SODIUM PHOSPHATE 4 MG/ML IJ SOLN
INTRAMUSCULAR | Status: DC | PRN
  Administered 2017-03-27: 10 mg via INTRAVENOUS

## 2017-03-27 MED ORDER — BUPIVACAINE LIPOSOME 1.3 % IJ SUSP
INTRAMUSCULAR | Status: AC
  Filled 2017-03-27: qty 20

## 2017-03-27 MED ORDER — SUMATRIPTAN SUCCINATE 50 MG PO TABS
50.0000 mg | ORAL_TABLET | ORAL | Status: DC | PRN
Start: 2017-03-27 — End: 2017-03-29
  Filled 2017-03-27: qty 1

## 2017-03-27 MED ORDER — EPHEDRINE SULFATE 50 MG/ML IJ SOLN
INTRAMUSCULAR | Status: DC | PRN
  Administered 2017-03-27 (×2): 10 mg via INTRAVENOUS

## 2017-03-27 MED ORDER — LIDOCAINE HCL (CARDIAC) 20 MG/ML IV SOLN
INTRAVENOUS | Status: DC | PRN
  Administered 2017-03-27: 50 mg via INTRAVENOUS

## 2017-03-27 MED ORDER — TRAMADOL HCL 50 MG PO TABS
100.0000 mg | ORAL_TABLET | Freq: Four times a day (QID) | ORAL | Status: DC | PRN
  Administered 2017-03-27 (×2): 100 mg via ORAL
  Filled 2017-03-27 (×2): qty 2

## 2017-03-27 MED ORDER — ROPIVACAINE HCL 5 MG/ML IJ SOLN
INTRAMUSCULAR | Status: DC | PRN
  Administered 2017-03-27: 30 mL

## 2017-03-27 MED ORDER — MIDAZOLAM HCL 2 MG/2ML IJ SOLN
1.0000 mg | INTRAMUSCULAR | Status: DC | PRN
  Administered 2017-03-27: 2 mg via INTRAVENOUS

## 2017-03-27 MED ORDER — SCOPOLAMINE 1 MG/3DAYS TD PT72
1.0000 | MEDICATED_PATCH | Freq: Once | TRANSDERMAL | Status: DC | PRN
  Administered 2017-03-27: 1.5 mg via TRANSDERMAL

## 2017-03-27 MED ORDER — GABAPENTIN 300 MG PO CAPS
ORAL_CAPSULE | ORAL | Status: AC
  Filled 2017-03-27: qty 1

## 2017-03-27 MED ORDER — BUPIVACAINE HCL (PF) 0.25 % IJ SOLN
INTRAMUSCULAR | Status: AC
  Filled 2017-03-27: qty 30

## 2017-03-27 MED ORDER — PHENYLEPHRINE HCL 10 MG/ML IJ SOLN
INTRAMUSCULAR | Status: AC
  Filled 2017-03-27: qty 1

## 2017-03-27 MED ORDER — CHLORHEXIDINE GLUCONATE CLOTH 2 % EX PADS: 6.0000 | MEDICATED_PAD | Freq: Once | CUTANEOUS | Status: DC

## 2017-03-27 MED ORDER — PHENYLEPHRINE HCL 10 MG/ML IJ SOLN
INTRAMUSCULAR | Status: DC | PRN
  Administered 2017-03-27: 20 ug via INTRAVENOUS

## 2017-03-27 MED ORDER — DEXAMETHASONE SODIUM PHOSPHATE 10 MG/ML IJ SOLN
INTRAMUSCULAR | Status: AC
  Filled 2017-03-27: qty 1

## 2017-03-27 MED ORDER — PROMETHAZINE HCL 25 MG/ML IJ SOLN: 6.2500 mg | INTRAMUSCULAR | Status: DC | PRN

## 2017-03-27 MED ORDER — METHOCARBAMOL 500 MG PO TABS: 500.0000 mg | ORAL_TABLET | Freq: Three times a day (TID) | ORAL | 0 refills | Status: DC | PRN

## 2017-03-27 MED ORDER — KETOROLAC TROMETHAMINE 15 MG/ML IJ SOLN
15.0000 mg | INTRAMUSCULAR | Status: DC
  Administered 2017-03-27: 15 mg via INTRAVENOUS

## 2017-03-27 MED ORDER — CEFAZOLIN SODIUM-DEXTROSE 2-4 GM/100ML-% IV SOLN
2.0000 g | Freq: Three times a day (TID) | INTRAVENOUS | Status: DC
  Administered 2017-03-27 – 2017-03-29 (×6): 2 g via INTRAVENOUS
  Filled 2017-03-27 (×7): qty 100

## 2017-03-27 MED ORDER — PROPOFOL 500 MG/50ML IV EMUL
INTRAVENOUS | Status: AC
  Filled 2017-03-27: qty 50

## 2017-03-27 MED ORDER — KETOROLAC TROMETHAMINE 30 MG/ML IJ SOLN
INTRAMUSCULAR | Status: AC
  Filled 2017-03-27: qty 1

## 2017-03-27 MED ORDER — HYDROMORPHONE HCL 1 MG/ML IJ SOLN
INTRAMUSCULAR | Status: AC
  Filled 2017-03-27: qty 0.5

## 2017-03-27 MED ORDER — CEFAZOLIN SODIUM 1 G IJ SOLR
INTRAMUSCULAR | Status: AC
  Filled 2017-03-27: qty 20

## 2017-03-27 MED ORDER — HYDROMORPHONE HCL 1 MG/ML IJ SOLN
0.2500 mg | INTRAMUSCULAR | Status: DC | PRN
  Administered 2017-03-27: 0.5 mg via INTRAVENOUS

## 2017-03-27 MED ORDER — ACETAMINOPHEN 325 MG PO TABS
650.0000 mg | ORAL_TABLET | Freq: Four times a day (QID) | ORAL | Status: DC | PRN
  Administered 2017-03-27: 650 mg via ORAL
  Filled 2017-03-27: qty 2

## 2017-03-27 MED ORDER — PHENYLEPHRINE HCL 10 MG/ML IJ SOLN
INTRAMUSCULAR | Status: DC | PRN
  Administered 2017-03-27: 50 ug/min via INTRAVENOUS

## 2017-03-27 MED ORDER — SULFAMETHOXAZOLE-TRIMETHOPRIM 800-160 MG PO TABS: 1.0000 | ORAL_TABLET | Freq: Two times a day (BID) | ORAL | 0 refills | Status: DC

## 2017-03-27 MED ORDER — ONDANSETRON HCL 4 MG/2ML IJ SOLN: 4.0000 mg | Freq: Four times a day (QID) | INTRAMUSCULAR | Status: DC | PRN

## 2017-03-27 MED ORDER — PROPOFOL 10 MG/ML IV BOLUS
INTRAVENOUS | Status: DC | PRN
  Administered 2017-03-27: 200 mg via INTRAVENOUS

## 2017-03-27 MED ORDER — ROCURONIUM BROMIDE 100 MG/10ML IV SOLN
INTRAVENOUS | Status: DC | PRN
  Administered 2017-03-27: 50 mg via INTRAVENOUS

## 2017-03-27 MED ORDER — LACTATED RINGERS IV SOLN
INTRAVENOUS | Status: DC
  Administered 2017-03-27 (×2): via INTRAVENOUS

## 2017-03-27 MED ORDER — ONDANSETRON HCL 4 MG/2ML IJ SOLN
INTRAMUSCULAR | Status: AC
  Filled 2017-03-27: qty 2

## 2017-03-27 MED ORDER — BUPIVACAINE LIPOSOME 1.3 % IJ SUSP
INTRAMUSCULAR | Status: DC | PRN
  Administered 2017-03-27: 20 mL

## 2017-03-27 MED ORDER — METHOCARBAMOL 500 MG PO TABS
500.0000 mg | ORAL_TABLET | Freq: Four times a day (QID) | ORAL | Status: DC | PRN
  Administered 2017-03-27 – 2017-03-29 (×5): 500 mg via ORAL
  Filled 2017-03-27 (×6): qty 1

## 2017-03-27 MED ORDER — SUGAMMADEX SODIUM 200 MG/2ML IV SOLN
INTRAVENOUS | Status: AC
  Filled 2017-03-27: qty 2

## 2017-03-27 MED ORDER — FENTANYL CITRATE (PF) 100 MCG/2ML IJ SOLN
50.0000 ug | INTRAMUSCULAR | Status: DC | PRN
  Administered 2017-03-27: 50 ug via INTRAVENOUS
  Administered 2017-03-27: 100 ug via INTRAVENOUS

## 2017-03-27 MED ORDER — LIDOCAINE 2% (20 MG/ML) 5 ML SYRINGE
INTRAMUSCULAR | Status: AC
  Filled 2017-03-27: qty 5

## 2017-03-27 MED ORDER — SODIUM CHLORIDE 0.9 % IJ SOLN
INTRAVENOUS | Status: DC | PRN
  Administered 2017-03-27: 5 mL via INTRADERMAL

## 2017-03-27 MED ORDER — KETOROLAC TROMETHAMINE 30 MG/ML IJ SOLN: 30.0000 mg | Freq: Once | INTRAMUSCULAR | Status: DC | PRN

## 2017-03-27 MED ORDER — CEFAZOLIN SODIUM-DEXTROSE 2-4 GM/100ML-% IV SOLN
INTRAVENOUS | Status: AC
  Filled 2017-03-27: qty 100

## 2017-03-27 MED ORDER — SCOPOLAMINE 1 MG/3DAYS TD PT72
MEDICATED_PATCH | TRANSDERMAL | Status: AC
  Filled 2017-03-27: qty 1

## 2017-03-27 MED ORDER — METHYLENE BLUE 0.5 % INJ SOLN
INTRAVENOUS | Status: AC
  Filled 2017-03-27: qty 10

## 2017-03-27 MED ORDER — SUGAMMADEX SODIUM 200 MG/2ML IV SOLN
INTRAVENOUS | Status: DC | PRN
  Administered 2017-03-27: 200 mg via INTRAVENOUS

## 2017-03-27 MED ORDER — SODIUM CHLORIDE 0.9 % IJ SOLN
INTRAMUSCULAR | Status: AC
Start: 2017-03-27 — End: ?
  Filled 2017-03-27: qty 10

## 2017-03-27 MED ORDER — ACETAMINOPHEN 500 MG PO TABS
1000.0000 mg | ORAL_TABLET | ORAL | Status: AC
  Administered 2017-03-27: 1000 mg via ORAL

## 2017-03-27 SURGICAL SUPPLY — 117 items
ALLODERM 132 THICK RTU PERF (Tissue) ×3 IMPLANT
ALLODERM SELECT RTU 132 THK PF (Tissue) ×9 IMPLANT
APPLIER CLIP 11 MED OPEN (CLIP)
APPLIER CLIP 9.375 MED OPEN (MISCELLANEOUS) ×4
BAG DECANTER FOR FLEXI CONT (MISCELLANEOUS) ×4 IMPLANT
BENZOIN TINCTURE PRP APPL 2/3 (GAUZE/BANDAGES/DRESSINGS) IMPLANT
BINDER BREAST LRG (GAUZE/BANDAGES/DRESSINGS) ×4 IMPLANT
BINDER BREAST MEDIUM (GAUZE/BANDAGES/DRESSINGS) IMPLANT
BINDER BREAST XLRG (GAUZE/BANDAGES/DRESSINGS) IMPLANT
BINDER BREAST XXLRG (GAUZE/BANDAGES/DRESSINGS) IMPLANT
BIOPATCH RED 1 DISK 7.0 (GAUZE/BANDAGES/DRESSINGS) IMPLANT
BLADE CLIPPER SURG (BLADE) IMPLANT
BLADE HEX COATED 2.75 (ELECTRODE) IMPLANT
BLADE SURG 10 STRL SS (BLADE) ×8 IMPLANT
BLADE SURG 15 STRL LF DISP TIS (BLADE) ×3 IMPLANT
BLADE SURG 15 STRL SS (BLADE) ×1
BNDG GAUZE ELAST 4 BULKY (GAUZE/BANDAGES/DRESSINGS) ×8 IMPLANT
CANISTER SUCT 1200ML W/VALVE (MISCELLANEOUS) ×12 IMPLANT
CHLORAPREP W/TINT 26ML (MISCELLANEOUS) ×8 IMPLANT
CLIP APPLIE 11 MED OPEN (CLIP) IMPLANT
CLIP APPLIE 9.375 MED OPEN (MISCELLANEOUS) ×3 IMPLANT
CLIP VESOCCLUDE SM WIDE 6/CT (CLIP) IMPLANT
COVER BACK TABLE 60X90IN (DRAPES) ×4 IMPLANT
COVER MAYO STAND STRL (DRAPES) ×8 IMPLANT
COVER PROBE W GEL 5X96 (DRAPES) ×4 IMPLANT
DECANTER SPIKE VIAL GLASS SM (MISCELLANEOUS) IMPLANT
DERMABOND ADVANCED (GAUZE/BANDAGES/DRESSINGS) ×2
DERMABOND ADVANCED .7 DNX12 (GAUZE/BANDAGES/DRESSINGS) ×6 IMPLANT
DEVICE DISSECT PLASMABLAD 3.0S (MISCELLANEOUS) IMPLANT
DRAIN CHANNEL 15F RND FF W/TCR (WOUND CARE) IMPLANT
DRAIN CHANNEL 19F RND (DRAIN) ×8 IMPLANT
DRAPE LAPAROSCOPIC ABDOMINAL (DRAPES) IMPLANT
DRAPE TOP ARMCOVERS (MISCELLANEOUS) ×4 IMPLANT
DRAPE U-SHAPE 76X120 STRL (DRAPES) ×4 IMPLANT
DRAPE UTILITY XL STRL (DRAPES) ×8 IMPLANT
DRSG PAD ABDOMINAL 8X10 ST (GAUZE/BANDAGES/DRESSINGS) ×8 IMPLANT
DRSG TEGADERM 4X10 (GAUZE/BANDAGES/DRESSINGS) ×12 IMPLANT
DRSG TEGADERM 4X4.75 (GAUZE/BANDAGES/DRESSINGS) IMPLANT
ELECT BLADE 4.0 EZ CLEAN MEGAD (MISCELLANEOUS) ×4
ELECT BLADE 6.5 .24CM SHAFT (ELECTRODE) IMPLANT
ELECT COATED BLADE 2.86 ST (ELECTRODE) ×4 IMPLANT
ELECT REM PT RETURN 9FT ADLT (ELECTROSURGICAL) ×4
ELECTRODE BLDE 4.0 EZ CLN MEGD (MISCELLANEOUS) ×3 IMPLANT
ELECTRODE REM PT RTRN 9FT ADLT (ELECTROSURGICAL) ×3 IMPLANT
EVACUATOR SILICONE 100CC (DRAIN) ×16 IMPLANT
EXPANDER BREAST TISSUE 300CC (Breast) ×8 IMPLANT
GAUZE SPONGE 4X4 12PLY STRL (GAUZE/BANDAGES/DRESSINGS) IMPLANT
GAUZE SPONGE 4X4 12PLY STRL LF (GAUZE/BANDAGES/DRESSINGS) IMPLANT
GLOVE BIO SURGEON STRL SZ 6 (GLOVE) ×12 IMPLANT
GLOVE BIO SURGEON STRL SZ 6.5 (GLOVE) ×4 IMPLANT
GLOVE BIO SURGEON STRL SZ7 (GLOVE) ×8 IMPLANT
GLOVE BIOGEL PI IND STRL 7.0 (GLOVE) ×6 IMPLANT
GLOVE BIOGEL PI IND STRL 7.5 (GLOVE) ×3 IMPLANT
GLOVE BIOGEL PI INDICATOR 7.0 (GLOVE) ×2
GLOVE BIOGEL PI INDICATOR 7.5 (GLOVE) ×1
GLOVE SURG SS PI 6.5 STRL IVOR (GLOVE) ×4 IMPLANT
GOWN STRL REUS W/ TWL LRG LVL3 (GOWN DISPOSABLE) ×15 IMPLANT
GOWN STRL REUS W/TWL LRG LVL3 (GOWN DISPOSABLE) ×5
ILLUMINATOR WAVEGUIDE N/F (MISCELLANEOUS) IMPLANT
IV NS 500ML (IV SOLUTION)
IV NS 500ML BAXH (IV SOLUTION) IMPLANT
KIT FILL SYSTEM UNIVERSAL (SET/KITS/TRAYS/PACK) ×4 IMPLANT
KIT MARKER MARGIN INK (KITS) IMPLANT
LIGHT WAVEGUIDE WIDE FLAT (MISCELLANEOUS) ×4 IMPLANT
MARKER SKIN DUAL TIP RULER LAB (MISCELLANEOUS) IMPLANT
NDL SAFETY ECLIPSE 18X1.5 (NEEDLE) ×3 IMPLANT
NEEDLE HYPO 18GX1.5 SHARP (NEEDLE) ×1
NEEDLE HYPO 25X1 1.5 SAFETY (NEEDLE) ×8 IMPLANT
NS IRRIG 1000ML POUR BTL (IV SOLUTION) ×8 IMPLANT
PACK BASIN DAY SURGERY FS (CUSTOM PROCEDURE TRAY) ×4 IMPLANT
PACK UNIVERSAL I (CUSTOM PROCEDURE TRAY) IMPLANT
PENCIL BUTTON HOLSTER BLD 10FT (ELECTRODE) ×4 IMPLANT
PIN SAFETY STERILE (MISCELLANEOUS) ×4 IMPLANT
PLASMABLADE 3.0S (MISCELLANEOUS)
PUNCH BIOPSY DERMAL 4MM (MISCELLANEOUS) IMPLANT
SHEET MEDIUM DRAPE 40X70 STRL (DRAPES) ×8 IMPLANT
SLEEVE SCD COMPRESS KNEE MED (MISCELLANEOUS) ×4 IMPLANT
SPONGE LAP 18X18 X RAY DECT (DISPOSABLE) ×28 IMPLANT
SPONGE LAP 4X18 X RAY DECT (DISPOSABLE) IMPLANT
STAPLER VISISTAT 35W (STAPLE) ×4 IMPLANT
STOCKINETTE IMPERVIOUS LG (DRAPES) IMPLANT
STRIP CLOSURE SKIN 1/2X4 (GAUZE/BANDAGES/DRESSINGS) IMPLANT
SUT CHROMIC 4 0 PS 2 18 (SUTURE) ×32 IMPLANT
SUT ETHIBOND 2-0 V-5 NEEDLE (SUTURE) IMPLANT
SUT ETHILON 2 0 FS 18 (SUTURE) ×12 IMPLANT
SUT ETHILON 3 0 PS 1 (SUTURE) IMPLANT
SUT MNCRL AB 3-0 PS2 18 (SUTURE) IMPLANT
SUT MNCRL AB 4-0 PS2 18 (SUTURE) ×12 IMPLANT
SUT MON AB 5-0 PS2 18 (SUTURE) IMPLANT
SUT PDS AB 2-0 CT2 27 (SUTURE) IMPLANT
SUT SILK 2 0 SH (SUTURE) ×8 IMPLANT
SUT SILK 3 0 PS 1 (SUTURE) IMPLANT
SUT VIC AB 0 CT1 27 (SUTURE)
SUT VIC AB 0 CT1 27XBRD ANBCTR (SUTURE) IMPLANT
SUT VIC AB 0 SH 27 (SUTURE) IMPLANT
SUT VIC AB 2-0 SH 27 (SUTURE) ×1
SUT VIC AB 2-0 SH 27XBRD (SUTURE) ×3 IMPLANT
SUT VIC AB 3-0 54X BRD REEL (SUTURE) IMPLANT
SUT VIC AB 3-0 BRD 54 (SUTURE)
SUT VIC AB 3-0 SH 27 (SUTURE) ×3
SUT VIC AB 3-0 SH 27X BRD (SUTURE) ×9 IMPLANT
SUT VICRYL 0 CT-2 (SUTURE) ×16 IMPLANT
SUT VICRYL 3-0 CR8 SH (SUTURE) ×4 IMPLANT
SUT VICRYL 4-0 PS2 18IN ABS (SUTURE) ×8 IMPLANT
SUT VLOC 180 0 24IN GS25 (SUTURE) ×8 IMPLANT
SYR 50ML LL SCALE MARK (SYRINGE) ×4 IMPLANT
SYR BULB IRRIGATION 50ML (SYRINGE) ×8 IMPLANT
SYR CONTROL 10ML LL (SYRINGE) ×8 IMPLANT
TAPE MEASURE VINYL STERILE (MISCELLANEOUS) IMPLANT
TISSUE ALLDRM SELECT RTU 132 (Tissue) ×9 IMPLANT
TOWEL OR 17X24 6PK STRL BLUE (TOWEL DISPOSABLE) ×12 IMPLANT
TOWEL OR NON WOVEN STRL DISP B (DISPOSABLE) ×4 IMPLANT
TRAY FOLEY BAG SILVER LF 14FR (SET/KITS/TRAYS/PACK) ×4 IMPLANT
TRAY FOLEY BAG SILVER LF 16FR (SET/KITS/TRAYS/PACK) IMPLANT
TUBE CONNECTING 20X1/4 (TUBING) ×4 IMPLANT
UNDERPAD 30X30 (UNDERPADS AND DIAPERS) ×8 IMPLANT
YANKAUER SUCT BULB TIP NO VENT (SUCTIONS) ×4 IMPLANT

## 2017-03-27 NOTE — Progress Notes (Addendum)
Assisted Dr. Kalman Shan with left, ultrasound guided, pectoralis block and nuc med tech with nuc med inj. Side rails up, monitors on throughout procedure. See vital signs in flow sheet. Tolerated Procedure well.

## 2017-03-27 NOTE — Anesthesia Postprocedure Evaluation (Signed)
Anesthesia Post Note  Patient: Laura Anthony  Procedure(s) Performed: LEFT NIPPLE SPARING MASTECTOMY WITH RADIOACTIVE SEED TARGETED LYMPH NODE EXCISION AND LEFT AXILLARY SENTINEL LYMPH NODE BIOPSY (Left Breast) RIGHT PROPHYLACTIC NIPPLE SPARING MASTECTOMY (Right Breast) BILATERAL BREAST RECONSTRUCTION WITH PLACEMENT OF TISSUE EXPANDER AND ALLODERM (Bilateral Breast)     Patient location during evaluation: PACU Anesthesia Type: General Level of consciousness: awake and alert Pain management: pain level controlled Vital Signs Assessment: post-procedure vital signs reviewed and stable Respiratory status: spontaneous breathing, nonlabored ventilation, respiratory function stable and patient connected to nasal cannula oxygen Cardiovascular status: blood pressure returned to baseline and stable Postop Assessment: no apparent nausea or vomiting Anesthetic complications: no Comments: Patient mildly hypotensive post procedure. Hgb after 800 cc blood loss was 7.3. Patient is awake and alert without complaints, other than pain from surgery She received 2 albumin post-op. BP stable and not trending down. OK to remain overnight here at [facility]. Home tomorrow    Last Vitals:  Vitals:   03/27/17 1445 03/27/17 1500  BP: 94/65 (!) 89/62  Pulse: 94 88  Resp: 13 12  Temp:    SpO2: 100% 100%    Last Pain:  Vitals:   03/27/17 1445  TempSrc:   PainSc: 5                  Sola Margolis S

## 2017-03-27 NOTE — Anesthesia Preprocedure Evaluation (Addendum)
Anesthesia Evaluation  Patient identified by MRN, date of birth, ID band Patient awake    Reviewed: Allergy & Precautions, NPO status , Patient's Chart, lab work & pertinent test results  History of Anesthesia Complications (+) PONV  Airway Mallampati: II  TM Distance: >3 FB Neck ROM: Full    Dental no notable dental hx.    Pulmonary neg pulmonary ROS,    Pulmonary exam normal breath sounds clear to auscultation       Cardiovascular negative cardio ROS Normal cardiovascular exam Rhythm:Regular Rate:Normal     Neuro/Psych negative neurological ROS  negative psych ROS   GI/Hepatic negative GI ROS, Neg liver ROS,   Endo/Other  negative endocrine ROS  Renal/GU negative Renal ROS  negative genitourinary   Musculoskeletal negative musculoskeletal ROS (+)   Abdominal   Peds negative pediatric ROS (+)  Hematology negative hematology ROS (+)   Anesthesia Other Findings   Reproductive/Obstetrics negative OB ROS                             Anesthesia Physical Anesthesia Plan  ASA: II  Anesthesia Plan: General   Post-op Pain Management:  Regional for Post-op pain   Induction: Intravenous  PONV Risk Score and Plan: 4 or greater and Ondansetron, Dexamethasone, Midazolam, Scopolamine patch - Pre-op and Treatment may vary due to age or medical condition  Airway Management Planned: LMA and Oral ETT  Additional Equipment:   Intra-op Plan:   Post-operative Plan: Extubation in OR  Informed Consent: I have reviewed the patients History and Physical, chart, labs and discussed the procedure including the risks, benefits and alternatives for the proposed anesthesia with the patient or authorized representative who has indicated his/her understanding and acceptance.   Dental advisory given  Plan Discussed with: CRNA and Surgeon  Anesthesia Plan Comments:        Anesthesia Quick  Evaluation

## 2017-03-27 NOTE — H&P (Signed)
Laura Anthony is an 42 y.o. female.   Chief Complaint: breast cancer s/p primary chemotherapy HPI:  74 yof who presented in referral from Dr Julien Girt with newly diagnosed left breast cancer. she noted palpable mass in the left breast and another noted at gyn visit. she underwent evaluation that showed multiple left breast masses. [place] had a 1.7 cm mass at 4 oclock, 11 mm mass at 6 and another 5 mm mass in the 630 position. she also has an axillary node that has a thickened cortex. biopsy of the breast lesions are er/pr negative and her2 positive with Ki 15-35%. she has no nipple dc. she has fh significant in her mother of passing away from tnbc. since then her genetics have been negative and she has undergone primary chemotherapy. the last infusion was on friday. she is doing well. her mri shows complete radiologic resolution. she is due to go on a [place] cruise from feb 16-20. she is here with her husband to discuss surgery.   Past Medical History:  Diagnosis Date  . ADD (attention deficit disorder)   . Anxiety   . 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 Cancer ([place])    left breast  . DVT (deep venous thrombosis) ([place]) 2017   calf left - probably due to [facility] pills-took eliquis x3 mos, nonthing now  . Family history of breast cancer   . Family history of prostate cancer   . Headache    migraine  . History of seizure    x 1 as a child - was never on anticonvulsants  . Impingement syndrome of right shoulder 07/2013  . PONV (postoperative nausea and vomiting)   . Right bicipital tenosynovitis 07/2013  . Rotator cuff impingement syndrome of left shoulder 07/12/2013  . Seizures ([place])     1 random as a child    Past Surgical History:  Procedure Laterality Date  . ADENOIDECTOMY    . ANKLE ARTHROSCOPY Right   . KNEE ARTHROSCOPY Right   . KNEE ARTHROSCOPY W/ ACL RECONSTRUCTION Left   . PORTACATH PLACEMENT N/A 11/01/2016   Procedure: INSERTION  PORT-A-CATH WITH [place];  Surgeon: Rolm Bookbinder, MD;  Location: [facility];  Service: General;  Laterality: N/A;  . SEPTOPLASTY WITH ETHMOIDECTOMY, AND MAXILLARY ANTROSTOMY  10/29/2010   bilat. max. antrostomy with left max. stripping; left ant. ethmoidectomy; right total ethmoidectomy; sphenoidotomy  . SHOULDER ARTHROSCOPY WITH SUBACROMIAL DECOMPRESSION AND BICEP TENDON REPAIR Right 07/12/2013   Procedure: RIGHT SHOULDER ARTHROSCOPY DEBRIDEMENT EXTENSIVE DECOMPRESSION SUBACROMIAL PARTIAL ACROMIOPLASTY;  Surgeon: Johnny Bridge, MD;  Location: [facility];  Service: Orthopedics;  Laterality: Right;  . WRIST ARTHROSCOPY  01/17/2012   Procedure: ARTHROSCOPY WRIST; right wrist Surgeon: Tennis Must, MD;  Location: [facility];  Service: Orthopedics;  Laterality: Right;  RIGHT WRIST ARTHROSCOPY WITH TRIANGULAR FIBROCARTILAGE COMPLEX REPAIR AND DEBRIDEMENT     Family History  Problem Relation Age of Onset  . Breast cancer Mother 37       triple negative  . Leukemia Father   . Lung cancer Father   . Heart attack Maternal Uncle   . Prostate cancer Paternal Uncle   . COPD Paternal Grandmother   . Heart disease Paternal Grandfather   . Prostate cancer Paternal Uncle   . Leukemia Cousin    Social History:  reports that  has never smoked. she has never used smokeless tobacco. She reports that she drinks alcohol. She reports that she does  not use drugs.  Allergies:  Allergies  Allergen Reactions  . Morphine And Related Shortness Of Breath and Itching    Medications Prior to Admission  Medication Sig Dispense Refill  . ALPRAZolam (XANAX) 0.5 MG tablet Take 0.5 mg by mouth at bedtime as needed for anxiety.    Marland Kitchen amphetamine-dextroamphetamine (ADDERALL) 20 MG tablet Take 20 mg by mouth daily as needed.    Marland Kitchen FLUoxetine (PROZAC) 20 MG capsule Take 20 mg by mouth every morning.    . lidocaine-prilocaine (EMLA) cream Apply to affected area once 30 g 3  . LORazepam (ATIVAN) 1  MG tablet Take 1 tablet (1 mg total) by mouth daily. 30 tablet 2  . Nutritional Supplements (JUICE PLUS FIBRE PO) Take 2 each by mouth daily. [employer]    . Nutritional Supplements (JUICE PLUS FIBRE PO) Take 2 each by mouth daily. [employer]    . prochlorperazine (COMPAZINE) 10 MG tablet Take 1 tablet (10 mg total) every 6 (six) hours as needed by mouth (Nausea or vomiting). 30 tablet 1  . promethazine (PHENERGAN) 25 MG tablet Take 1 tablet (25 mg total) by mouth every 6 (six) hours as needed for nausea or vomiting. 30 tablet 0  . rizatriptan (MAXALT) 10 MG tablet Take 10 mg by mouth as needed for migraine. May repeat in 2 hours if needed    . zolpidem (AMBIEN) 10 MG tablet Take 10 mg by mouth at bedtime as needed for sleep.    Marland Kitchen ondansetron (ZOFRAN) 8 MG tablet Take 1 tablet (8 mg total) 2 (two) times daily as needed by mouth for refractory nausea / vomiting. Start on day 3 after chemo. 30 tablet 1    No results found for this or any previous visit (from the past 48 hour(s)). No results found.  Review of Systems  All other systems reviewed and are negative.   Blood pressure (!) 117/91, pulse 74, temperature 98.1 F (36.7 C), temperature source Oral, resp. rate 17, height 5' 7"  (1.702 m), weight 70.4 kg (155 lb 2 oz), last menstrual period 12/15/2016, SpO2 100 %. Physical Exam  Vitals  Weight: 152.8 lb Height: 67in Body Surface Area: 1.8 m Body Mass Index: 23.93 kg/m  Temp.: 98.59F  Pulse: 72 (Regular)  BP: 116/72 (Sitting, Left Arm, Standard) Physical Exam  General Mental Status-Alert. Orientation-Oriented X3. Eye Sclera/Conjunctiva - Bilateral-No scleral icterus. Chest and Lung Exam Chest and lung exam reveals -quiet, even and easy respiratory effort with no use of accessory muscles and on auscultation, normal breath sounds, no adventitious sounds and normal vocal resonance. Breast Nipples-No Discharge. Breast Lump-No Palpable Breast  Mass. Cardiovascular Cardiovascular examination reveals -normal heart sounds, regular rate and rhythm with no murmurs. Lymphatic Head & Neck General Head & Neck Lymphatics: Bilateral - Description - Normal. Axillary General Axillary Region: Bilateral - Description - Normal. 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 we discussed nsm which I believe she is a candidate for. will have her see plastics tomorrow to discuss reonstruction. she would very much like a prophylactic right nsm also. I think not unreasonable given her age. she understands this is not medically necessary and that it will not improve survival and is not 100% preventive for breast cancer. due to response in nodes and number at beginning I think resonable to attempt targeted node dissection with seed  guided excision of prior positive node and sentinel node. we discussed lymphedema risk and also if nodes are positive on final path she likely will need alnd. will plan to schedule after her vacation. risks discussed.  Rolm Bookbinder, MD 03/27/2017, 7:12 AM

## 2017-03-27 NOTE — Progress Notes (Signed)
Pts BP continues to be low, Dr Kalman Shan by to see patient Albumin ordered.  Initiated Albumin per order, pt tolerating well. Will continue to monitor and manage.

## 2017-03-27 NOTE — Op Note (Signed)
Operative Note   DATE OF OPERATION: 2.25.19  LOCATION: [facility]-outpatient  SURGICAL DIVISION: Plastic Surgery  PREOPERATIVE DIAGNOSES:  1. Left breast cancer LOQ ER- 2. Neoadjuvant chemotherapy . Family history breast ca  POSTOPERATIVE DIAGNOSES:  same  PROCEDURE:  1. Bilatera breast reconstruction with tissue expanders 2. Acellular dermis for breast reconstruction  SURGEON: Irene Limbo MD MBA  ASSISTANT: none  ANESTHESIA:  General.   EBL: 700 ml for entire procedure  COMPLICATIONS: None immediate.   INDICATIONS FOR PROCEDURE:  The patient, Laura Anthony, is a 42 y.o. female born on 24-Jul-1975, is here for immediate prepectoral expander, ADM based reconstruction following bilateral nipple sparing mastectomies.   FINDINGS: Natrelle [id number]-11-T 300 ml tissue expanders placed bilateral, initial fill volume 250 ml air. LEFT SN [id number] RIGHT SN [id number]  DESCRIPTION OF PROCEDURE:  The patient was marked with the patient in the preoperative area to mark sternal notch, chest midline, anterior axillary lines and inframammary folds. Foley catheter placed. The patient's operative site was prepped and draped in a sterile fashion. A time out was performed and all information was confirmed to be correct. Please refer to Dr. Donne Hazel description of mastectomies. I assisted in bilateral mastectomies and sentinel node sampling in the form of retraction. Following completion of mastectomies, reconstruction began on right side.  The cavity was irrigated with solution containing Ancef, gentamicin, and bacitracin. Hemostasis was ensured. A 19 Fr drain was placed in subcutaneous position laterally and a 15 Fr drain placed along inframammary fold. Each secured to skin with 2-0 nylon. Cavity irrigated with Betadine. The tissue expanders were prepared on back table prior in insertion. The expander was filled with air to 250 ml. Perforated acellular dermis was draped over anterior surface  expander. The ADM was then secured to itself over posterior surface of expander. Redundant folds acellular dermis excised so that the ADM lied flat without folds over air filled expander.The expander was secured to fascia over lateral sternal border with a 3-0 vicryl. The  lateral tab was also secured to pectoralis muscle with 3-0-vicryl. The ADM was secured to pectoralis muscle and chest wall along inferior border at inframammary fold with 0 V-lock suture.Laterally the mastectomy flap over posterior axillary line was advanced anteriorly and the subcutaneous tissue and superficial fascia was secured to pectoralis muscle and acellular dermis with 0-vicryl. Skin closure completedwith 3-0 vicryl in fascial layer and 4-0 vicryl in dermis. Skin closure completed with 4-0 monocryl subcuticular and tissue adhesive. A small perforation of areolar skin was closed primarily with 4-0 monocryl in dermis and 4-0 chromic for skin cloure  I then directed my attention to left chest where similar irrigation and drain placement completed. The prepared expander with ADM secured over anterior surface was placed in right chest and tabs secured to chest wall and pectoralis muscle with 3-0 vicryl suture. The acellular dermis at inframammary fold was secured to chest wall with 0 V-lock suture.Laterally the mastectomy flap over posterior axillary line was advanced anteriorly and the subcutaneous tissue and superficial fascia was secured to pectoralis muscle and acellular dermis with 0-vicryl. Skin closure completedwith 3-0 vicryl in fascial layer and 4-0 vicryl in dermis. Skin closure completed with 4-0 monocryl subcuticular and tissue adhesive. Patient was brought to upright sitting position and the mastectomy flaps were redraped so that NAC was symmetric from midline and sternal notch. Tegaderm dressings applied followed by breast binder.  The patient was allowed to wake from anesthesia, extubated and taken to the recovery room  in satisfactory  condition.   SPECIMENS: none  DRAINS: 15 and 19 Fr JP in right and left subcutaneous chest  Irene Limbo, MD [facility] Plastic & Reconstructive Surgery [phone], pin [phone]

## 2017-03-27 NOTE — Transfer of Care (Signed)
Immediate Anesthesia Transfer of Care Note  Patient: Laura Anthony  Procedure(s) Performed: LEFT NIPPLE SPARING MASTECTOMY WITH RADIOACTIVE SEED TARGETED LYMPH NODE EXCISION AND LEFT AXILLARY SENTINEL LYMPH NODE BIOPSY (Left Breast) RIGHT PROPHYLACTIC NIPPLE SPARING MASTECTOMY (Right Breast) BILATERAL BREAST RECONSTRUCTION WITH PLACEMENT OF TISSUE EXPANDER AND ALLODERM (Bilateral Breast)  Patient Location: PACU  Anesthesia Type:General  Level of Consciousness: awake and sedated  Airway & Oxygen Therapy: Patient Spontanous Breathing and Patient connected to face mask oxygen  Post-op Assessment: Report given to RN and Post -op Vital signs reviewed and stable  Post vital signs: Reviewed and stable  Last Vitals:  Vitals:   03/27/17 1206 03/27/17 1208  BP: (!) 59/32 (!) 59/34  Pulse: (!) 49 (!) 50  Resp: (!) 0 10  Temp:    SpO2: 100% 100%    Last Pain:  Vitals:   03/27/17 0646  TempSrc: Oral         Complications: No apparent anesthesia complications

## 2017-03-27 NOTE — Anesthesia Procedure Notes (Signed)
Procedure Name: Intubation Performed by: Dameon Soltis W, CRNA Pre-anesthesia Checklist: Patient identified, Emergency Drugs available, Suction available and Patient being monitored Patient Re-evaluated:Patient Re-evaluated prior to induction Oxygen Delivery Method: Circle system utilized Preoxygenation: Pre-oxygenation with 100% oxygen Induction Type: IV induction Ventilation: Mask ventilation without difficulty Laryngoscope Size: Miller and 2 Grade View: Grade I Tube type: Oral Tube size: 7.0 mm Number of attempts: 1 Airway Equipment and Method: Stylet Placement Confirmation: ETT inserted through vocal cords under direct vision,  positive ETCO2 and breath sounds checked- equal and bilateral Secured at: 22 cm Tube secured with: Tape Dental Injury: Teeth and Oropharynx as per pre-operative assessment        

## 2017-03-27 NOTE — Progress Notes (Signed)
Spoke with Dr. Kalman Shan regarding patients status and BP, Dr. Kalman Shan states he is unconcerned with BP as pt runs on the low side and has no other comorbidities.  Discussed staffing and access to emergency care should the need arise. Order received for Midodrine 10mg  to be given po.  Will continue to monitor and manage and admit to [place] overnight.

## 2017-03-27 NOTE — Brief Op Note (Signed)
03/27/2017  9:58 AM  PATIENT:  Laura Anthony  42 y.o. female  PRE-OPERATIVE DIAGNOSIS:  LEFT BREAST CANCER  POST-OPERATIVE DIAGNOSIS:  LEFT BREAST CANCER  PROCEDURE:  Procedure(s) with comments: LEFT NIPPLE SPARING MASTECTOMY WITH RADIOACTIVE SEED TARGETED LYMPH NODE EXCISION AND LEFT AXILLARY SENTINEL LYMPH NODE BIOPSY (Left) - REQUESTS RNFA RIGHT PROPHYLACTIC NIPPLE SPARING MASTECTOMY (Right) BILATERAL BREAST RECONSTRUCTION WITH PLACEMENT OF TISSUE EXPANDER AND ALLODERM (Bilateral)  SURGEON:  Surgeon(s) and Role: Panel 1:    Rolm Bookbinder, MD - Primary Panel 2:    * Irene Limbo, MD - Primary  PHYSICIAN ASSISTANT:   ASSISTANTS: none   ANESTHESIA:   general with left pec block  EBL:  550 mL   BLOOD ADMINISTERED:none  DRAINS: per Dr Iran Planas   LOCAL MEDICATIONS USED:  OTHER exparel  SPECIMEN:  Simple Mastectomy  DISPOSITION OF SPECIMEN:  PATHOLOGY  COUNTS:  YES  TOURNIQUET:  * No tourniquets in log *  DICTATION: .Dragon Dictation  PLAN OF CARE: Admit for overnight observation  PATIENT DISPOSITION:  PACU - hemodynamically stable.   Delay start of Pharmacological VTE agent (>24hrs) due to surgical blood loss or risk of bleeding: not applicable

## 2017-03-27 NOTE — Anesthesia Procedure Notes (Signed)
Anesthesia Procedure Image    

## 2017-03-27 NOTE — Interval H&P Note (Signed)
History and Physical Interval Note:  03/27/2017 7:14 AM  Laura Anthony  has presented today for surgery, with the diagnosis of LEFT BREAST CANCER  The various methods of treatment have been discussed with the patient and family. After consideration of risks, benefits and other options for treatment, the patient has consented to  Procedure(s) with comments: LEFT NIPPLE [place] AND LEFT AXILLARY SENTINEL LYMPH NODE BIOPSY (Left) - REQUESTS RNFA RIGHT PROPHYLACTIC NIPPLE SPARING MASTECTOMY (Right) BILATERAL BREAST RECONSTRUCTION WITH PLACEMENT OF TISSUE EXPANDER AND ALLODERM (Bilateral) as a surgical intervention .  The patient's history has been reviewed, patient examined, no change in status, stable for surgery.  I have reviewed the patient's chart and labs.  Questions were answered to the patient's satisfaction.     Rolm Bookbinder

## 2017-03-27 NOTE — Op Note (Addendum)
Preoperative diagnosis: left breast cancer s/p primary chemotherapy Postoperative diagnosis: same as above Procedure 1. Left nipple sparing mastectomy 2. Right prophylactic nipple sparing mastectomy 3. Left axillary sentinel node biopsy and left seed guided node excision 4. Injection blue dye for sentinel node identification Surgeon: Dr Serita Grammes Asst: Dr Irene Limbo Anesthesia general with left pectoral block Specimens; 1. Right nsm marked short superior long lateral double nac 2. Right nipple specimen 3. Left nsm marked short superior long lateral double nac 4. Left nipple specimen 5. Left seed containing node (also blue and a sentinel node) 6. Additional left axillary sentinel nodes Drains per plastic surgery EBL: 300 cc Sponge and needle count correct times two dispo case turned over to plastic surgery for reconstruction  Indications: This is a 44 yof with node positive left cancer who has undergone primary chemotherapy. She has a great response on mri.  elected for rrm on right and left nsm with left tad.    Procedure: After informed consent obtained patient was taken to OR. She had undergone bilateral pectoral blocks. She was placed under general anesthesia. She was prepped and draped in standard sterile surgical fashion. A surgical timeout was performed.   I injected a blue dye saline mixture in retroareolar position and massaged this for later sn identification I first identified the seed containing node in the left axilla. I made an axillary incision and carried this through the fascia.  I then identified the seed containing node and excised this the clip and the seed were both confirmed by mammogram.  I then removed several other nodes that were radioactive and some were blue and these were passed off as sentinel node.  I obtained hemostasis. I closed this with 2-0 vicryl, 3-0 vicryl and 4-0 monocryl.  Glue and steristrips were placed. I then did the left  mastectomy.   I made a inframammary incision.  I then released the breast from the pectoralis muscle to the parasternal area, clavicle and latissimus laterally.   I then created the anterior flap. The breast tissue was removed.  I then removed the nipple and passed it off. I did confirm with a mm that both clips were in specimen.  Hemostasis was obtained. The specimen was marked and passed off.  This was packed and I moved to other side.  I then performed the risk reducing right nipple sparing mastectomy in similar fashion to landmarks. I sent nipple as separate specimen.   The case was then turned over to plastic surgery for reconstruction.

## 2017-03-27 NOTE — Progress Notes (Signed)
Dr. Iran Planas called to update on pts condition. Dr.Thimmapa states she has been monitoring pts chart through notes and VS and feel comfortable with her staying overnight in [place]. Report given to Marta Antu RN.

## 2017-03-27 NOTE — Progress Notes (Signed)
Pt presents from OR with initial BP of 59/32, HR49. Placed in trendelenburg and Oxygen applied via face mask LR opened for fluid bolus.  Bufford Lope CRNA at bedside, Neosynephrine and Ephedrine given IV, BP up to 94/49 after medication administration.  Dr. Kalman Shan consulted about BP and blood loss in OR, I stat ordered.  Hgb 7.8 and Hct 23 results called to Dr. Kalman Shan.  Will repeat I stat in am.  Will monitor BP closely and support with fluids if needed.

## 2017-03-27 NOTE — Interval H&P Note (Signed)
History and Physical Interval Note:  03/27/2017 7:05 AM  Laura Anthony  has presented today for surgery, with the diagnosis of LEFT BREAST CANCER  The various methods of treatment have been discussed with the patient and family. After consideration of risks, benefits and other options for treatment, the patient has consented to  Procedure(s) with comments: LEFT NIPPLE [place] AND LEFT AXILLARY SENTINEL LYMPH NODE BIOPSY (Left) - REQUESTS RNFA RIGHT PROPHYLACTIC NIPPLE SPARING MASTECTOMY (Right) BILATERAL BREAST RECONSTRUCTION WITH PLACEMENT OF TISSUE EXPANDER AND ALLODERM (Bilateral) as a surgical intervention .  The patient's history has been reviewed, patient examined, no change in status, stable for surgery.  I have reviewed the patient's chart and labs.  Questions were answered to the patient's satisfaction.     Laura Anthony

## 2017-03-27 NOTE — Progress Notes (Signed)
Pts BP still low after albumin Dr. Kalman Shan made aware, second albumin ordered to transfuse quickly per Dr. Kalman Shan.

## 2017-03-27 NOTE — Anesthesia Procedure Notes (Signed)
Anesthesia Regional Block: Pectoralis block   Pre-Anesthetic Checklist: ,, timeout performed, Correct Patient, Correct Site, Correct Laterality, Correct Procedure, Correct Position, site marked, Risks and benefits discussed,  Surgical consent,  Pre-op evaluation,  At surgeon's request and post-op pain management  Laterality: Left  Prep: chloraprep       Needles:  Injection technique: Single-shot  Needle Type: Echogenic Needle     Needle Length: 9cm      Additional Needles:   Procedures:,,,, ultrasound used (permanent image in chart),,,,  Narrative:  Start time: 03/27/2017 7:00 AM End time: 03/27/2017 7:13 AM Injection made incrementally with aspirations every 5 mL.  Performed by: Personally  Anesthesiologist: Myrtie Soman, MD  Additional Notes: Patient tolerated the procedure well without complications

## 2017-03-28 ENCOUNTER — Encounter (HOSPITAL_COMMUNITY): Payer: Self-pay | Admitting: General Practice

## 2017-03-28 ENCOUNTER — Other Ambulatory Visit: Payer: Self-pay

## 2017-03-28 DIAGNOSIS — C50512 Malignant neoplasm of lower-outer quadrant of left female breast: Secondary | ICD-10-CM | POA: Diagnosis not present

## 2017-03-28 LAB — POCT I-STAT, CHEM 8
BUN: 10 mg/dL (ref 6–20)
Calcium, Ion: 1.11 mmol/L — ABNORMAL LOW (ref 1.15–1.40)
Chloride: 100 mmol/L — ABNORMAL LOW (ref 101–111)
Creatinine, Ser: 0.7 mg/dL (ref 0.44–1.00)
Glucose, Bld: 95 mg/dL (ref 65–99)
HCT: 21 % — ABNORMAL LOW (ref 36.0–46.0)
Hemoglobin: 7.1 g/dL — ABNORMAL LOW (ref 12.0–15.0)
Potassium: 3.6 mmol/L (ref 3.5–5.1)
Sodium: 140 mmol/L (ref 135–145)
TCO2: 26 mmol/L (ref 22–32)

## 2017-03-28 MED ORDER — MORPHINE SULFATE (PF) 4 MG/ML IV SOLN
INTRAVENOUS | Status: AC
  Filled 2017-03-28: qty 1

## 2017-03-28 MED ORDER — MORPHINE SULFATE (PF) 4 MG/ML IV SOLN
2.0000 mg | INTRAVENOUS | Status: DC | PRN
  Administered 2017-03-29: 2 mg via INTRAVENOUS
  Filled 2017-03-28: qty 1

## 2017-03-28 MED ORDER — OXYCODONE HCL 5 MG PO TABS
10.0000 mg | ORAL_TABLET | ORAL | Status: DC | PRN
  Administered 2017-03-28 – 2017-03-29 (×5): 10 mg via ORAL
  Filled 2017-03-28 (×6): qty 2

## 2017-03-28 MED ORDER — DIPHENHYDRAMINE HCL 25 MG PO CAPS: 25.0000 mg | ORAL_CAPSULE | Freq: Three times a day (TID) | ORAL | Status: DC | PRN

## 2017-03-28 NOTE — Progress Notes (Signed)
Patient ID: Laura Anthony, female   DOB: November 14, 1975, 42 y.o.   MRN: [id number] Sore, pain not well controlled, difficult to get out of bed and move, drains as expected, hb low last night but stable, rechecking this am, vitals all fine, some ecchymosis on flaps but no hematoma.  [facility] for [facility] prior to dc. Not ready to go today

## 2017-03-28 NOTE — Discharge Instructions (Signed)
About my Jackson-Pratt Bulb Drain ° °What is a Jackson-Pratt bulb? °A Jackson-Pratt is a soft, round device used to collect drainage. It is connected to a long, thin drainage catheter, which is held in place by one or two small stiches near your surgical incision site. When the bulb is squeezed, it forms a vacuum, forcing the drainage to empty into the bulb. ° °Emptying the Jackson-Pratt bulb- °To empty the bulb: °1. Release the plug on the top of the bulb. °2. Pour the bulb's contents into a measuring container which your nurse will provide. °3. Record the time emptied and amount of drainage. Empty the drain(s) as often as your     doctor or nurse recommends. ° °Date                  Time                    Amount (Drain 1)                 Amount (Drain 2) ° °_____________________________________________________________________ ° °_____________________________________________________________________ ° °_____________________________________________________________________ ° °_____________________________________________________________________ ° °_____________________________________________________________________ ° °_____________________________________________________________________ ° °_____________________________________________________________________ ° °_____________________________________________________________________ ° °Squeezing the Jackson-Pratt Bulb- °To squeeze the bulb: °1. Make sure the plug at the top of the bulb is open. °2. Squeeze the bulb tightly in your fist. You will hear air squeezing from the bulb. °3. Replace the plug while the bulb is squeezed. °4. Use a safety pin to attach the bulb to your clothing. This will keep the catheter from     pulling at the bulb insertion site. ° °When to call your doctor- °Call your doctor if: °· Drain site becomes red, swollen or hot. °· You have a fever greater than 101 degrees F. °· There is oozing at the drain site. °· Drain falls out (apply a guaze  bandage over the drain hole and secure it with tape). °· Drainage increases daily not related to activity patterns. (You will usually have more drainage when you are active than when you are resting.) °· Drainage has a bad odor. ° ° °

## 2017-03-28 NOTE — Progress Notes (Signed)
  POD#1 bilateral NSM left SLN bilateral TE/ADM reconstruction  Temp:  [97.1 F (36.2 C)-99.5 F (37.5 C)] 97.6 F (36.4 C) (02/26 0415) Pulse Rate:  [45-98] 74 (02/26 0415) Resp:  [10-20] 16 (02/26 0415) BP: (59-99)/(32-65) 96/64 (02/26 0415) SpO2:  [95 %-100 %] 100 % (02/26 0700)   JP 35/45/60/25  PE: Chest soft developing ecchymoses right chest JP serosanguinous Tegaderms in place  A/P Switch to oxycodone. Poor pain control plan transfer to [facility].  OOB ambulate  Also concern family may have flu-test pending if positive start Tamiflu  Irene Limbo, MD [facility] Plastic & Reconstructive Surgery [phone], pin [phone]

## 2017-03-28 NOTE — Progress Notes (Signed)
Report given to Vinnie Level, [place] for [phone].  IV left intact, four JP drains intact and empty, Vitals stable.  Patient and family aware of transfer.  EMS currently preparing patient for transfer.

## 2017-03-29 ENCOUNTER — Encounter (HOSPITAL_BASED_OUTPATIENT_CLINIC_OR_DEPARTMENT_OTHER): Payer: Self-pay | Admitting: General Surgery

## 2017-03-29 DIAGNOSIS — C50912 Malignant neoplasm of unspecified site of left female breast: Secondary | ICD-10-CM | POA: Diagnosis not present

## 2017-03-29 DIAGNOSIS — C50512 Malignant neoplasm of lower-outer quadrant of left female breast: Secondary | ICD-10-CM | POA: Diagnosis not present

## 2017-03-29 DIAGNOSIS — Z9011 Acquired absence of right breast and nipple: Secondary | ICD-10-CM | POA: Diagnosis not present

## 2017-03-29 MED ORDER — FERROUS SULFATE 325 (65 FE) MG PO TABS: 325.0000 mg | ORAL_TABLET | Freq: Two times a day (BID) | ORAL | 1 refills | Status: DC

## 2017-03-29 MED ORDER — OXYCODONE HCL 10 MG PO TABS: 5.0000 mg | ORAL_TABLET | ORAL | 0 refills | Status: DC | PRN

## 2017-03-29 NOTE — Discharge Summary (Signed)
Physician Discharge Summary  Patient ID: Laura Anthony MRN: [id number] DOB/AGE: August 08, 1975 42 y.o.  Admit date: 03/27/2017 Discharge date: 03/29/2017  Admission Diagnoses: Left breast cancer  Discharge Diagnoses:  Active Problems:   Breast cancer, stage 2, left [facility])  Discharged Condition: stable  Hospital Course: Patient transferred from [place] on POD# 1 to [facility] for poor pain control. She initially had episodes hypotension postoperatively that stabilized. Pain improved with oral oxycodone. She was ambulatory with minimal assist. Pathology reviewed with patient prior to discharge.   Significant Diagnostic Studies: labs:  CBC    Component Value Date/Time   WBC 4.5 03/23/2017 1207   RBC 3.55 (L) 03/23/2017 1207   HGB 7.1 (L) 03/28/2017 0724   HGB 11.1 (L) 01/27/2017 0802   HCT 21.0 (L) 03/28/2017 0724   HCT 33.4 (L) 01/27/2017 0802   PLT 232 03/23/2017 1207   PLT 170 01/27/2017 0802   MCV 99.7 03/23/2017 1207   MCV 99.0 01/27/2017 0802   MCH 33.8 03/23/2017 1207   MCHC 33.9 03/23/2017 1207   RDW 13.2 03/23/2017 1207   RDW 18.2 (H) 01/27/2017 0802   LYMPHSABS 2.3 03/23/2017 1207   LYMPHSABS 1.7 01/27/2017 0802   MONOABS 0.3 03/23/2017 1207   MONOABS 0.5 01/27/2017 0802   EOSABS 0.0 03/23/2017 1207   EOSABS 0.0 01/27/2017 0802   BASOSABS 0.0 03/23/2017 1207   BASOSABS 0.0 01/27/2017 0802    Treatments: surgery: bilateral nipple sparing mastectomies with immediate expander acellular dermis reconstruction, left SLN  Discharge Exam: Blood pressure (!) 97/57, pulse 70, temperature 99.2 F (37.3 C), temperature source Oral, resp. rate 18, height 5\' 7"  (1.702 m), weight 70.4 kg (155 lb 2 oz), last menstrual period 12/15/2016, SpO2 98 %. Incision/Wound: chest soft Tegaderms in place some ecchymoses without hematoma drains serosanguinous incisions intact  Disposition: 01-Home or Self Care  Discharge Instructions    Call MD for:  redness,  tenderness, or signs of infection (pain, swelling, bleeding, redness, odor or green/yellow discharge around incision site)   Complete by:  As directed    Call MD for:  redness, tenderness, or signs of infection (pain, swelling, bleeding, redness, odor or green/yellow discharge around incision site)   Complete by:  As directed    Call MD for:  temperature >100.5   Complete by:  As directed    Call MD for:  temperature >100.5   Complete by:  As directed    Discharge instructions   Complete by:  As directed    Ok to remove dressings and shower am 2.27.19. Soap and water ok, pat tegaderms dry. Do not remove tegaderms. No creams or ointments over incisions. Do not let drains dangle in shower, attach to lanyard or similar.Strip and record drains twice daily and bring log to clinic visit.  Breast binder or soft compression bra all other times.  Ok to raise arms above shoulders for bathing and dressing.  No house yard work or exercise until cleared by MD.   Recommend Miralax or dulcolax for constipation. Recommend ibuprofen as directed with meals to aid with pain control.   Discharge instructions   Complete by:  As directed    Ok to shower am 2.27.19. Soap and water ok, pat Tegaderms dry. Do not remove Tegaderms. No creams or ointments over incisions. Do not let drains dangle in shower, attach to lanyard or similar.Strip and record drains twice daily and bring log to clinic visit.  Breast binder or soft compression bra all other times.  Ok to raise arms above shoulders for bathing and dressing.  No house yard work or exercise until cleared by MD. Recommend Miralax or dulcolax as needed for constipation. Recommend start prenatal vitamin for anemia.   Driving Restrictions   Complete by:  As directed    No driving for 2 weeks   Driving Restrictions   Complete by:  As directed    No driving for 2 weeks then no driving if taking narcotics   Lifting restrictions   Complete by:  As directed    No  lifting > 5 lbs until cleared by MD   Lifting restrictions   Complete by:  As directed    No lifting > 5 lbs until cleared by MD   Resume previous diet   Complete by:  As directed    Resume previous diet   Complete by:  As directed      Allergies as of 03/29/2017      Reactions   Sumatriptan Other (See Comments)   Numbness to face   Statins Other (See Comments)   Leg pain      Medication List    TAKE these medications   ALPRAZolam 0.5 MG tablet Commonly known as:  XANAX Take 0.5 mg by mouth at bedtime as needed for anxiety.   amphetamine-dextroamphetamine 20 MG tablet Commonly known as:  ADDERALL Take 20 mg by mouth daily as needed.   FLUoxetine 20 MG capsule Commonly known as:  PROZAC Take 20 mg by mouth every morning.   JUICE PLUS FIBRE PO Take 2 each by mouth daily. [place] Blend   JUICE PLUS FIBRE PO Take 2 each by mouth daily. [place] Blend   lidocaine-prilocaine cream Commonly known as:  EMLA Apply to affected area once   LORazepam 1 MG tablet Commonly known as:  ATIVAN Take 1 tablet (1 mg total) by mouth daily.   methocarbamol 500 MG tablet Commonly known as:  ROBAXIN Take 1 tablet (500 mg total) by mouth every 8 (eight) hours as needed for muscle spasms.   Oxycodone HCl 10 MG Tabs Take 0.5-1 tablets (5-10 mg total) by mouth every 4 (four) hours as needed for moderate pain.   promethazine 25 MG tablet Commonly known as:  PHENERGAN Take 1 tablet (25 mg total) by mouth every 6 (six) hours as needed for nausea or vomiting.   rizatriptan 10 MG tablet Commonly known as:  MAXALT Take 10 mg by mouth as needed for migraine. May repeat in 2 hours if needed   sulfamethoxazole-trimethoprim 800-160 MG tablet Commonly known as:  BACTRIM DS,SEPTRA DS Take 1 tablet by mouth 2 (two) times daily.   zolpidem 10 MG tablet Commonly known as:  AMBIEN Take 10 mg by mouth at bedtime as needed for sleep.      Follow-up Information    Rolm Bookbinder, MD In 2  weeks.   Specialty:  General Surgery Contact information: [address] [place] [address] [phone]        Irene Limbo, MD In 1 week.   Specialty:  Plastic Surgery Why:  as scheduled Contact information: [address] [place] [address] [phone]           Signed: Irene Limbo 03/29/2017, 7:23 AM

## 2017-03-30 ENCOUNTER — Other Ambulatory Visit: Payer: 59

## 2017-03-30 ENCOUNTER — Ambulatory Visit: Payer: 59 | Admitting: Hematology and Oncology

## 2017-03-30 ENCOUNTER — Ambulatory Visit: Payer: 59

## 2017-04-03 DIAGNOSIS — Z9013 Acquired absence of bilateral breasts and nipples: Secondary | ICD-10-CM | POA: Insufficient documentation

## 2017-04-06 ENCOUNTER — Encounter: Payer: Self-pay | Admitting: Hematology and Oncology

## 2017-04-06 ENCOUNTER — Inpatient Hospital Stay: Payer: 59

## 2017-04-06 ENCOUNTER — Inpatient Hospital Stay: Payer: 59 | Attending: Hematology and Oncology

## 2017-04-06 ENCOUNTER — Inpatient Hospital Stay (HOSPITAL_BASED_OUTPATIENT_CLINIC_OR_DEPARTMENT_OTHER): Payer: 59 | Admitting: Hematology and Oncology

## 2017-04-06 ENCOUNTER — Telehealth: Payer: Self-pay | Admitting: Hematology and Oncology

## 2017-04-06 DIAGNOSIS — Z171 Estrogen receptor negative status [ER-]: Secondary | ICD-10-CM | POA: Insufficient documentation

## 2017-04-06 DIAGNOSIS — Z79899 Other long term (current) drug therapy: Secondary | ICD-10-CM

## 2017-04-06 DIAGNOSIS — Z9221 Personal history of antineoplastic chemotherapy: Secondary | ICD-10-CM | POA: Insufficient documentation

## 2017-04-06 DIAGNOSIS — C50512 Malignant neoplasm of lower-outer quadrant of left female breast: Secondary | ICD-10-CM

## 2017-04-06 DIAGNOSIS — Z95828 Presence of other vascular implants and grafts: Secondary | ICD-10-CM

## 2017-04-06 DIAGNOSIS — Z1501 Genetic susceptibility to malignant neoplasm of breast: Secondary | ICD-10-CM | POA: Insufficient documentation

## 2017-04-06 DIAGNOSIS — Z5112 Encounter for antineoplastic immunotherapy: Secondary | ICD-10-CM | POA: Insufficient documentation

## 2017-04-06 DIAGNOSIS — Z9013 Acquired absence of bilateral breasts and nipples: Secondary | ICD-10-CM | POA: Insufficient documentation

## 2017-04-06 LAB — COMPREHENSIVE METABOLIC PANEL
ALT: 21 U/L (ref 0–55)
AST: 24 U/L (ref 5–34)
Albumin: 3.4 g/dL — ABNORMAL LOW (ref 3.5–5.0)
Alkaline Phosphatase: 88 U/L (ref 40–150)
Anion gap: 8 (ref 3–11)
BUN: 10 mg/dL (ref 7–26)
CO2: 27 mmol/L (ref 22–29)
Calcium: 9.3 mg/dL (ref 8.4–10.4)
Chloride: 103 mmol/L (ref 98–109)
Creatinine, Ser: 0.83 mg/dL (ref 0.60–1.10)
GFR calc Af Amer: >60 mL/min (ref >60)
GFR calc non Af Amer: >60 mL/min (ref >60)
Glucose, Bld: 90 mg/dL (ref 70–140)
Potassium: 4 mmol/L (ref 3.5–5.1)
Sodium: 138 mmol/L (ref 136–145)
Total Bilirubin: 0.2 mg/dL (ref 0.2–1.2)
Total Protein: 6.9 g/dL (ref 6.4–8.3)

## 2017-04-06 LAB — CBC WITH DIFFERENTIAL/PLATELET
Basophils Absolute: 0.1 10*3/uL (ref 0.0–0.1)
Basophils Relative: 1 %
Eosinophils Absolute: 0.1 10*3/uL (ref 0.0–0.5)
Eosinophils Relative: 1 %
HCT: 25.4 % — ABNORMAL LOW (ref 34.8–46.6)
Hemoglobin: 8.4 g/dL — ABNORMAL LOW (ref 11.6–15.9)
Lymphocytes Relative: 39 %
Lymphs Abs: 1.8 10*3/uL (ref 0.9–3.3)
MCH: 33.2 pg (ref 25.1–34.0)
MCHC: 33.1 g/dL (ref 31.5–36.0)
MCV: 100.4 fL (ref 79.5–101.0)
Monocytes Absolute: 0.3 10*3/uL (ref 0.1–0.9)
Monocytes Relative: 7 %
Neutro Abs: 2.4 10*3/uL (ref 1.5–6.5)
Neutrophils Relative %: 52 %
Platelets: 310 10*3/uL (ref 145–400)
RBC: 2.53 MIL/uL — ABNORMAL LOW (ref 3.70–5.45)
RDW: 13.3 % (ref 11.2–14.5)
WBC: 4.6 10*3/uL (ref 3.9–10.3)

## 2017-04-06 MED ORDER — SODIUM CHLORIDE 0.9% FLUSH
10.0000 mL | INTRAVENOUS | Status: DC | PRN
  Administered 2017-04-06: 10 mL via INTRAVENOUS
  Filled 2017-04-06: qty 10

## 2017-04-06 MED ORDER — TRASTUZUMAB CHEMO 150 MG IV SOLR
6.0000 mg/kg | Freq: Once | INTRAVENOUS | Status: AC
  Administered 2017-04-06: 378 mg via INTRAVENOUS
  Filled 2017-04-06: qty 18

## 2017-04-06 MED ORDER — HEPARIN SOD (PORK) LOCK FLUSH 100 UNIT/ML IV SOLN
500.0000 [IU] | Freq: Once | INTRAVENOUS | Status: AC | PRN
  Administered 2017-04-06: 500 [IU]
  Filled 2017-04-06: qty 5

## 2017-04-06 MED ORDER — DIPHENHYDRAMINE HCL 25 MG PO CAPS
50.0000 mg | ORAL_CAPSULE | Freq: Once | ORAL | Status: AC
  Administered 2017-04-06: 50 mg via ORAL

## 2017-04-06 MED ORDER — SODIUM CHLORIDE 0.9 % IV SOLN
Freq: Once | INTRAVENOUS | Status: AC
  Administered 2017-04-06: 10:00:00 via INTRAVENOUS

## 2017-04-06 MED ORDER — SODIUM CHLORIDE 0.9 % IV SOLN
420.0000 mg | Freq: Once | INTRAVENOUS | Status: AC
  Administered 2017-04-06: 420 mg via INTRAVENOUS
  Filled 2017-04-06: qty 14

## 2017-04-06 MED ORDER — SODIUM CHLORIDE 0.9% FLUSH
10.0000 mL | INTRAVENOUS | Status: DC | PRN
  Administered 2017-04-06: 10 mL
  Filled 2017-04-06: qty 10

## 2017-04-06 MED ORDER — ACETAMINOPHEN 325 MG PO TABS
650.0000 mg | ORAL_TABLET | Freq: Once | ORAL | Status: AC
  Administered 2017-04-06: 650 mg via ORAL

## 2017-04-06 MED ORDER — DIPHENHYDRAMINE HCL 25 MG PO CAPS
ORAL_CAPSULE | ORAL | Status: AC
  Filled 2017-04-06: qty 2

## 2017-04-06 MED ORDER — ACETAMINOPHEN 325 MG PO TABS
ORAL_TABLET | ORAL | Status: AC
  Filled 2017-04-06: qty 2

## 2017-04-06 NOTE — Progress Notes (Signed)
Confirmed w/ Dr. Lindi Adie- no reload today. Kennith Center, Pharm.D., CPP 04/06/2017@10 :18 AM

## 2017-04-06 NOTE — Progress Notes (Signed)
 Patient Care Team: Badger, [name], MD as PCP - General (Family Medicine)  DIAGNOSIS:  Encounter Diagnosis  Name Primary?  . Malignant neoplasm of lower-outer quadrant of left breast of female, estrogen receptor negative (HCC)     SUMMARY OF ONCOLOGIC HISTORY:   Malignant neoplasm of lower-outer quadrant of left breast of female, estrogen receptor negative (HCC)   10/20/2016 Mammogram    Mammogram and ultrasound of the left breast revealed 1.7 cm mass at 4:00 position, 6:30 position 5 x 4 x 4 mm mass, 6:00 position 5 cm nipple 7 x 6 x 11 mm, left axillary lymph node with thickened cortex, T1c N1 stage II a AJCC 8      10/24/2016 Initial Diagnosis    Left breast biopsy 6:30 position 3 cm from nipple: IDC grade 2, DCIS, ER 0%, PR 0%, Ki-67 15% HER-2 positive ratio 2.1; 4:00 position 3 cm from nipple: IDC grade 2, DCIS, ER 0%, PR 0%, Ki-67 35%, HER-2 positive ratio 2.02; left axillary lymph node biopsy positive      11/04/2016 -  Neo-Adjuvant Chemotherapy    TCH Perjeta 6 cycles followed by Herceptin +/- Perjeta maintenance      11/30/2016 Genetic Testing    Negative genetic testing on the common hereditary cancer panel.  The Hereditary Gene Panel offered by [facility] includes sequencing and/or deletion duplication testing of the following 47 genes: APC, ATM, AXIN2, BARD1, BMPR1A, BRCA1, BRCA2, BRIP1, CDH1, CDK4, CDKN2A (p14ARF), CDKN2A (p16INK4a), CHEK2, CTNNA1, DICER1, EPCAM (Deletion/duplication testing only), GREM1 (promoter region deletion/duplication testing only), KIT, MEN1, MLH1, MSH2, MSH3, MSH6, MUTYH, NBN, NF1, NHTL1, PALB2, PDGFRA, PMS2, POLD1, POLE, PTEN, RAD50, RAD51C, RAD51D, SDHB, SDHC, SDHD, SMAD4, SMARCA4. STK11, TP53, TSC1, TSC2, and VHL.  The following genes were evaluated for sequence changes only: SDHA and HOXB13 c.251G>A variant only. The report date is November 30, 2016.       03/27/2017 Surgery    Bilateral mastectomies: Left mastectomy: IDC grade 2 0.9 cm, nodes  negative, right mastectomy benign, ER 0%, PR 0%, HER-2 positive ratio 2.6       CHIEF COMPLIANT: Follow-up after recent bilateral mastectomies  INTERVAL HISTORY: Laura Anthony is a 41-year-old with above-mentioned history of left breast cancer underwent bilateral mastectomies after undergoing neoadjuvant chemotherapy and is here today to discuss the pathology report.  She is healing and recovering fairly well from the surgery.  During surgery she had lost significant amount of blood.  She became very anemic with a hemoglobin down to 7.1.  She was put on oral iron therapy.  She appears to be getting better with a hemoglobin up to 8.4 today.  REVIEW OF SYSTEMS:   Constitutional: Denies fevers, chills or abnormal weight loss Eyes: Denies blurriness of vision Ears, nose, mouth, throat, and face: Denies mucositis or sore throat Respiratory: Denies cough, dyspnea or wheezes Cardiovascular: Denies palpitation, chest discomfort Gastrointestinal:  Denies nausea, heartburn or change in bowel habits Skin: Denies abnormal skin rashes Lymphatics: Denies new lymphadenopathy or easy bruising Neurological:Denies numbness, tingling or new weaknesses Behavioral/Psych: Mood is stable, no new changes  Extremities: No lower extremity edema Breast: Bilateral mastectomies with reconstruction ongoing All other systems were reviewed with the patient and are negative.  I have reviewed the past medical history, past surgical history, social history and family history with the patient and they are unchanged from previous note.  ALLERGIES:  is allergic to sumatriptan and statins.  MEDICATIONS:  Current Outpatient Medications  Medication Sig Dispense Refill  . ALPRAZolam (  XANAX) 0.5 MG tablet Take 0.5 mg by mouth at bedtime as needed for anxiety.    Marland Kitchen amphetamine-dextroamphetamine (ADDERALL) 20 MG tablet Take 20 mg by mouth daily as needed.    . ferrous sulfate 325 (65 FE) MG tablet Take 1 tablet (325  mg total) by mouth 2 (two) times daily with a meal. 60 tablet 1  . FLUoxetine (PROZAC) 20 MG capsule Take 20 mg by mouth every morning.    . lidocaine-prilocaine (EMLA) cream Apply to affected area once 30 g 3  . LORazepam (ATIVAN) 1 MG tablet Take 1 tablet (1 mg total) by mouth daily. 30 tablet 2  . methocarbamol (ROBAXIN) 500 MG tablet Take 1 tablet (500 mg total) by mouth every 8 (eight) hours as needed for muscle spasms. 20 tablet 0  . Nutritional Supplements (JUICE PLUS FIBRE PO) Take 2 each by mouth daily. [employer]    . Nutritional Supplements (JUICE PLUS FIBRE PO) Take 2 each by mouth daily. [employer]    . oxyCODONE 10 MG TABS Take 0.5-1 tablets (5-10 mg total) by mouth every 4 (four) hours as needed for moderate pain. 40 tablet 0  . promethazine (PHENERGAN) 25 MG tablet Take 1 tablet (25 mg total) by mouth every 6 (six) hours as needed for nausea or vomiting. 30 tablet 0  . rizatriptan (MAXALT) 10 MG tablet Take 10 mg by mouth as needed for migraine. May repeat in 2 hours if needed    . sulfamethoxazole-trimethoprim (BACTRIM DS,SEPTRA DS) 800-160 MG tablet Take 1 tablet by mouth 2 (two) times daily. 12 tablet 0  . zolpidem (AMBIEN) 10 MG tablet Take 10 mg by mouth at bedtime as needed for sleep.     No current facility-administered medications for this visit.     PHYSICAL EXAMINATION: ECOG PERFORMANCE STATUS: 1 - Symptomatic but completely ambulatory  Vitals:   04/06/17 0912  BP: 122/78  Pulse: 70  Resp: 18  Temp: 98.7 F (37.1 C)  SpO2: 100%   Filed Weights   04/06/17 0912  Weight: 151 lb 8 oz (68.7 kg)    GENERAL:alert, no distress and comfortable SKIN: skin color, texture, turgor are normal, no rashes or significant lesions EYES: normal, Conjunctiva are pink and non-injected, sclera clear OROPHARYNX:no exudate, no erythema and lips, buccal mucosa, and tongue normal  NECK: supple, thyroid normal size, non-tender, without nodularity LYMPH:  no palpable  lymphadenopathy in the cervical, axillary or inguinal LUNGS: clear to auscultation and percussion with normal breathing effort HEART: regular rate & rhythm and no murmurs and no lower extremity edema ABDOMEN:abdomen soft, non-tender and normal bowel sounds MUSCULOSKELETAL:no cyanosis of digits and no clubbing  NEURO: alert & oriented x 3 with fluent speech, no focal motor/sensory deficits EXTREMITIES: No lower extremity edema  LABORATORY DATA:  I have reviewed the data as listed CMP Latest Ref Rng & Units 03/28/2017 03/27/2017 03/27/2017  Glucose 65 - 99 mg/dL 95 209(H) 175(H)  BUN 6 - 20 mg/dL _0 Creatinine 0.44 - 1.00 mg/dL 0.70 0.70 0.70  Sodium 135 - 145 mmol/L 140 139 137  Potassium 3.5 - 5.1 mmol/L 3.6 4.2 3.6  Chloride 101 - 111 mmol/L 100(L) 100(L) 99(L)  CO2 22 - 29 mmol/L - - -  Calcium 8.4 - 10.4 mg/dL - - -  Total Protein 6.4 - 8.3 g/dL - - -  Total Bilirubin 0.2 - 1.2 mg/dL - - -  Alkaline Phos 40 - 150 U/L - - -  AST 5 -  34 U/L - - -  ALT 0 - 55 U/L - - -    Lab Results  Component Value Date   WBC 4.6 04/06/2017   HGB 8.4 (L) 04/06/2017   HCT 25.4 (L) 04/06/2017   MCV 100.4 04/06/2017   PLT 310 04/06/2017   NEUTROABS 2.4 04/06/2017    ASSESSMENT & PLAN:  Malignant neoplasm of lower-outer quadrant of left breast of female, estrogen receptor negative (HCC) Mammogram and ultrasound of the left breast revealed 1.7 cm mass at 4:00 position, 6:30 position 5 x 4 x 4 mm mass, 6:00 position 5 cm nipple 7 x 6 x 11 mm, left axillary lymph node with thickened cortex, T1c N1 stage II a AJCC 8  10/24/2016: Left breast biopsy 6:30 position 3 cm from nipple: IDC grade 2, DCIS, ER 0%, PR 0%, 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 Neoadjuvant chemotherapy with TCHP This would be followed by Herceptin and  Perjeta maintenance for 1 year. 2. bilateral mastectomies 03/28/2016:Bilateral mastectomies: Left mastectomy: IDC grade 2 0.9 cm, nodes negative, right mastectomy benign, ER 0%, PR 0%, HER-2 positive ratio 2.6 3. Followed by adjuvant radiation ---------------------------------------------------------------------------- Current treatment: Herceptin Perjeta maintenance Patient underwent breast reconstruction with expanders. She will be meeting with radiation oncology. I recommended adding Perjeta maintenance based upon the fact that she did not have a complete pathologic response.  Severe anemia during hospitalization due to blood loss during surgery: Her hemoglobin is up to 8.4.  We can continue to watch and monitor this. Patient went for a [place] cruise right before her surgery and enjoyed it.  Return to clinic every 3 weeks for Herceptin and Perjeta and every 6 weeks for follow-up with me.   I spent 25 minutes talking to the patient of which more than half was spent in counseling and coordination of care.  No orders of the defined types were placed in this encounter.  The patient has a good understanding of the overall plan. she agrees with it. she will call with any problems that may develop before the next visit here.   Harriette Ohara, MD 04/06/17

## 2017-04-06 NOTE — Patient Instructions (Signed)
[facility] Discharge Instructions for Patients Receiving Chemotherapy  Today you received the following chemotherapy agents Herceptin and Perjeta.   To help prevent nausea and vomiting after your treatment, we encourage you to take your nausea medication as directed.   If you develop nausea and vomiting that is not controlled by your nausea medication, call the clinic.   BELOW ARE SYMPTOMS THAT SHOULD BE REPORTED IMMEDIATELY:  *FEVER GREATER THAN 100.5 F  *CHILLS WITH OR WITHOUT FEVER  NAUSEA AND VOMITING THAT IS NOT CONTROLLED WITH YOUR NAUSEA MEDICATION  *UNUSUAL SHORTNESS OF BREATH  *UNUSUAL BRUISING OR BLEEDING  TENDERNESS IN MOUTH AND THROAT WITH OR WITHOUT PRESENCE OF ULCERS  *URINARY PROBLEMS  *BOWEL PROBLEMS  UNUSUAL RASH Items with * indicate a potential emergency and should be followed up as soon as possible.  Feel free to call the clinic should you have any questions or concerns. The clinic phone number is [phone].  Please show the CHEMO ALERT CARD at check-in to the Emergency Department and triage nurse.   

## 2017-04-06 NOTE — Telephone Encounter (Signed)
Patient will receive updated copy of schedule in treatment  area. Patient scheduled per 3/7 los.

## 2017-04-06 NOTE — Assessment & Plan Note (Signed)
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 Neoadjuvant chemotherapy with TCHP This would be followed by Herceptin and Perjeta maintenance for 1 year. 2. bilateral mastectomies 03/28/2016:Bilateral mastectomies: Left mastectomy: IDC grade 2 0.9 cm, nodes negative, right mastectomy benign, ER 0%, PR 0%, HER-2 positive ratio 2.6 3. Followed by adjuvant radiation ---------------------------------------------------------------------------- Current treatment: Herceptin Perjeta maintenance Patient underwent breast reconstruction with expanders. She will be meeting with radiation oncology. I recommended adding Perjeta maintenance based upon the fact that she did not have a complete pathologic response.  Severe anemia during hospitalization due to blood loss during surgery: Her hemoglobin is up to 8.4.  We can continue to watch and monitor this.  Return to clinic every 3 weeks for Herceptin and Perjeta and every 6 weeks for follow-up with me.

## 2017-04-10 NOTE — Progress Notes (Signed)
Location of Breast Cancer:Malignant Neoplasm of Left Outer Quadrant of Left Breast.  Histology per Pathology Report: 10/24/2016 Left Breast     Receptor Status: ER(-), PR (-), Her2-neu (+), Ki-(67 35%)  Did patient present with symptoms or was this found on screening mammography?: Mammogram and ultrasound revealed a 1.7 cm mass at 4:00 position, 6:30 position 5 x 4 x 4 mm mass, 6:00 position 5 cm nipple 7 x 6 x 11 mm, left axillary lymph node with thickened cortex, T1cN1 stage II a AJCC8. 10/20/2016  10/24/2016: Left breast biopsy 6:30 position 3 cm from nipple: IDC grade 2, DCIS, ER 0%, PR 0%, Ki-67 15% HER-2 positive ratio 2.1; 4:00 position 3 cm from nipple: IDC grade 2, DCIS, ER 0%, PR 0%, Ki-67 35%, HER-2 positive ratio 2.02 Lymph node biopsy positive.    Past/Anticipated interventions by surgeon, if any:  Patient underwent bilateral mastectomies on 03/27/2017: left grade 2, nodes negative, right benign.  03/27/2017: 1. Bilatera breast reconstruction with tissue expanders 2. Acellular dermis for breast reconstruction   Past/Anticipated interventions by medical oncology, if any: Chemotherapy? Chemo started 11/04/2016  Treatment plan:Genetic counseling 1. Neoadjuvant chemotherapy with TCHP This would be followed by Herceptin and Perjeta maintenance for 1 year. 2. bilateral mastectomies 03/28/2016:Bilateral mastectomies: Left mastectomy: IDC grade 2 0.9 cm, nodes negative, right mastectomy benign, ER 0%, PR 0%, HER-2 positive ratio 2.6 3. Followed by adjuvant radiation   Lymphedema issues, if any:   No  Pain issues, if any:  Just a little pain in the underarm where they lymph node was removed, 2/10  BP 108/79 (BP Location: Right Arm, Patient Position: Sitting, Cuff Size: Normal)   Pulse 68   Temp 98.4 F (36.9 C) (Oral)   Resp 16   Ht 5' 7"  (1.702 m)   Wt 155 lb (70.3 kg)   SpO2 100%   BMI 24.28 kg/m    Wt Readings from Last 3 Encounters:  04/11/17 155 lb (70.3 kg)  04/06/17  151 lb 8 oz (68.7 kg)  03/27/17 155 lb 2 oz (70.4 kg)    SAFETY ISSUES:  Prior radiation? No  Pacemaker/ICD? No  Possible current pregnancy? No  Is the patient on methotrexate? No   Current Complaints / other details:       Cori Razor, RN 04/10/2017,9:24 AM

## 2017-04-11 ENCOUNTER — Ambulatory Visit
Admission: RE | Admit: 2017-04-11 | Discharge: 2017-04-11 | Disposition: A | Discharge status: Home / Self Care | Payer: 59 | Source: Ambulatory Visit | Attending: Radiation Oncology | Admitting: Radiation Oncology

## 2017-04-11 ENCOUNTER — Other Ambulatory Visit: Payer: Self-pay

## 2017-04-11 ENCOUNTER — Encounter: Payer: Self-pay | Admitting: Radiation Oncology

## 2017-04-11 VITALS — BP 108/79 | HR 68 | Temp 98.4°F | Resp 16 | Ht 67.0 in | Wt 155.0 lb

## 2017-04-11 DIAGNOSIS — Z9013 Acquired absence of bilateral breasts and nipples: Secondary | ICD-10-CM | POA: Diagnosis not present

## 2017-04-11 DIAGNOSIS — Z888 Allergy status to other drugs, medicaments and biological substances status: Secondary | ICD-10-CM | POA: Insufficient documentation

## 2017-04-11 DIAGNOSIS — Z79891 Long term (current) use of opiate analgesic: Secondary | ICD-10-CM | POA: Insufficient documentation

## 2017-04-11 DIAGNOSIS — Z9889 Other specified postprocedural states: Secondary | ICD-10-CM | POA: Diagnosis not present

## 2017-04-11 DIAGNOSIS — Z171 Estrogen receptor negative status [ER-]: Secondary | ICD-10-CM | POA: Diagnosis not present

## 2017-04-11 DIAGNOSIS — C50512 Malignant neoplasm of lower-outer quadrant of left female breast: Secondary | ICD-10-CM

## 2017-04-11 DIAGNOSIS — Z79899 Other long term (current) drug therapy: Secondary | ICD-10-CM | POA: Diagnosis not present

## 2017-04-11 DIAGNOSIS — Z803 Family history of malignant neoplasm of breast: Secondary | ICD-10-CM | POA: Insufficient documentation

## 2017-04-11 DIAGNOSIS — C773 Secondary and unspecified malignant neoplasm of axilla and upper limb lymph nodes: Secondary | ICD-10-CM | POA: Diagnosis not present

## 2017-04-11 DIAGNOSIS — F419 Anxiety disorder, unspecified: Secondary | ICD-10-CM | POA: Diagnosis not present

## 2017-04-11 DIAGNOSIS — Z9221 Personal history of antineoplastic chemotherapy: Secondary | ICD-10-CM | POA: Insufficient documentation

## 2017-04-11 HISTORY — DX: Impingement syndrome of right shoulder: M75.41

## 2017-04-11 NOTE — Progress Notes (Signed)
Radiation Oncology         [phone] ________________________________  Name: Laura Anthony        MRN: [id number]  Date of Service: 04/11/2017 DOB: 05/06/1975  CC:Badger, [name], MD  Gudena, Vinay, MD     REFERRING PHYSICIAN: Gudena, Vinay, MD   DIAGNOSIS: The encounter diagnosis was Malignant neoplasm of lower-outer quadrant of left breast of female, estrogen receptor negative (HCC).   HISTORY OF PRESENT ILLNESS: Laura Anthony is a 41 y.o. female seen at the request of Dr. Gudena for history of left breast cancer.  The patient was originally found to have a palpable lesion in the left breast measuring 1.7 x 1.6 x 1 cm on diagnostic imaging as well as a concerning axillary lymph node.  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  She was counseled on the role for neoadjuvant chemotherapy which she began in October 2018.  She completed this course, and followed with repeat MRI scan which noted improvement in her disease.  She underwent bilateral mastectomies on 03/28/2017 with immediate tissue expander reconstruction.  Her surgeon is Dr. Thimmappa for plastic surgery, and she had a right breast that was benign, on the left residual carcinoma was measured at 9 mm, and was felt to be grade 2.  Her margins were free of disease, and 3 of the sampled lymph nodes were negative.  She comes today to discuss the options of postmastectomy radiotherapy.  She is scheduled to see Dr. Thimmappa on 04/19/2017.  At that point she will begin her expansion of her tissue expanders.  PREVIOUS RADIATION THERAPY: No   PAST MEDICAL HISTORY:  Past Medical History:  Diagnosis Date  . ADD (attention deficit disorder)   . Anemia   . Anxiety   . 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 Breast cancer, left breast (HCC)    S/P mastectomy 03/27/2017  . DVT (deep venous  thrombosis) (HCC) 2017   calf left - probably due to BC pills-took eliquis x3 mos, nonthing now  . Family history of breast cancer   . Family history of prostate cancer   . High cholesterol   . Impingement syndrome of right shoulder 07/2013  . Migraine    "usually 1/month" (03/28/2017)  . PONV (postoperative nausea and vomiting)   . Right bicipital tenosynovitis 07/2013  . Rotator cuff impingement syndrome of right shoulder 07/12/2013  . Seizures (HCC)    x 1 as a child - was never on anticonvulsants (03/28/2017)       PAST SURGICAL HISTORY: Past Surgical History:  Procedure Laterality Date  . ADENOIDECTOMY  1981  . ANKLE ARTHROSCOPY Right   . BREAST BIOPSY Left 10/2016  . KNEE ARTHROSCOPY Right   . KNEE ARTHROSCOPY W/ ACL RECONSTRUCTION Left   . MASTECTOMY Left 03/27/2017   NIPPLE SPARING MASTECTOMY WITH RADIOACTIVE SEED TARGETED LYMPH NODE EXCISION AND LEFT AXILLARY SENTINEL LYMPH NODE BIOPSY  . MASTECTOMY Right 03/27/2017   RIGHT PROPHYLACTIC NIPPLE SPARING MASTECTOMY  . NIPPLE SPARING MASTECTOMY Right 03/27/2017   Procedure: RIGHT PROPHYLACTIC NIPPLE SPARING MASTECTOMY;  Surgeon: Wakefield, Matthew, MD;  Location: [facility];  Service: General;  Laterality: Right;  . PORTACATH PLACEMENT N/A 11/01/2016   Procedure: INSERTION PORT-A-CATH WITH US;  Surgeon: Wakefield, Matthew, MD;  Location: [facility];  Service: General;  Laterality: N/A;  . RADIOACTIVE SEED GUIDED AXILLARY SENTINEL LYMPH NODE Left 03/27/2017     Procedure: LEFT NIPPLE SPARING MASTECTOMY WITH RADIOACTIVE SEED TARGETED LYMPH NODE EXCISION AND LEFT AXILLARY SENTINEL LYMPH NODE BIOPSY;  Surgeon: Rolm Bookbinder, MD;  Location: [place];  Service: General;  Laterality: Left;  REQUESTS RNFA  . RECONSTRUCTION BREAST IMMEDIATE / DELAYED W/ TISSUE EXPANDER Bilateral 03/27/2017   BILATERAL BREAST RECONSTRUCTION WITH PLACEMENT OF TISSUE EXPANDER AND ALLODERM  . SEPTOPLASTY WITH ETHMOIDECTOMY, AND  MAXILLARY ANTROSTOMY  10/29/2010   bilat. max. antrostomy with left max. stripping; left ant. ethmoidectomy; right total ethmoidectomy; sphenoidotomy  . SHOULDER ARTHROSCOPY WITH SUBACROMIAL DECOMPRESSION AND BICEP TENDON REPAIR Right 07/12/2013   Procedure: RIGHT SHOULDER ARTHROSCOPY DEBRIDEMENT EXTENSIVE DECOMPRESSION SUBACROMIAL PARTIAL ACROMIOPLASTY;  Surgeon: Johnny Bridge, MD;  Location: [place];  Service: Orthopedics;  Laterality: Right;  . WRIST ARTHROSCOPY  01/17/2012   Procedure: ARTHROSCOPY WRIST; right wrist Surgeon: Tennis Must, MD;  Location: [place];  Service: Orthopedics;  Laterality: Right;  RIGHT WRIST ARTHROSCOPY WITH TRIANGULAR FIBROCARTILAGE COMPLEX REPAIR AND DEBRIDEMENT      FAMILY HISTORY:  Family History  Problem Relation Age of Onset  . Breast cancer Mother 43       triple negative  . Leukemia Father   . Lung cancer Father   . Heart attack Maternal Uncle   . Prostate cancer Paternal Uncle   . COPD Paternal Grandmother   . Heart disease Paternal Grandfather   . Prostate cancer Paternal Uncle   . Leukemia Cousin      SOCIAL HISTORY:  reports that  has never smoked. she has never used smokeless tobacco. She reports that she drinks about 0.6 oz of alcohol per week. She reports that she does not use drugs.   ALLERGIES: Sumatriptan and Statins   MEDICATIONS:  Current Outpatient Medications  Medication Sig Dispense Refill  . amphetamine-dextroamphetamine (ADDERALL) 20 MG tablet Take 20 mg by mouth daily as needed.    . ferrous sulfate 325 (65 FE) MG tablet Take 1 tablet (325 mg total) by mouth 2 (two) times daily with a meal. 60 tablet 1  . FLUoxetine (PROZAC) 20 MG capsule Take 20 mg by mouth every morning.    . lidocaine-prilocaine (EMLA) cream Apply to affected area once 30 g 3  . LORazepam (ATIVAN) 1 MG tablet Take 1 tablet (1 mg total) by mouth daily. 30 tablet 2  . Nutritional Supplements (JUICE PLUS FIBRE PO) Take  2 each by mouth daily. [employer]    . Nutritional Supplements (JUICE PLUS FIBRE PO) Take 2 each by mouth daily. [employer]    . rizatriptan (MAXALT) 10 MG tablet Take 10 mg by mouth as needed for migraine. May repeat in 2 hours if needed    . zolpidem (AMBIEN) 10 MG tablet Take 10 mg by mouth at bedtime as needed for sleep.    Marland Kitchen ALPRAZolam (XANAX) 0.5 MG tablet Take 0.5 mg by mouth at bedtime as needed for anxiety.    . methocarbamol (ROBAXIN) 500 MG tablet Take 1 tablet (500 mg total) by mouth every 8 (eight) hours as needed for muscle spasms. (Patient not taking: Reported on 04/11/2017) 20 tablet 0  . oxyCODONE 10 MG TABS Take 0.5-1 tablets (5-10 mg total) by mouth every 4 (four) hours as needed for moderate pain. (Patient not taking: Reported on 04/11/2017) 40 tablet 0  . promethazine (PHENERGAN) 25 MG tablet Take 1 tablet (25 mg total) by mouth every 6 (six) hours as needed for nausea or vomiting. (Patient not taking: Reported on  04/11/2017) 30 tablet 0  . sulfamethoxazole-trimethoprim (BACTRIM DS,SEPTRA DS) 800-160 MG tablet Take 1 tablet by mouth 2 (two) times daily. (Patient not taking: Reported on 04/11/2017) 12 tablet 0   No current facility-administered medications for this encounter.      REVIEW OF SYSTEMS: On review of systems, the patient reports that she is doing well overall. She did great during chemotherapy, and denies any chest pain, shortness of breath, cough, fevers, chills, night sweats, unintended weight changes. She had drains out yesterday and feels very well today. She denies any bowel or bladder disturbances, and denies abdominal pain, nausea or vomiting. She denies any new musculoskeletal or joint aches or pains. A complete review of systems is obtained and is otherwise negative.     PHYSICAL EXAM:  Wt Readings from Last 3 Encounters:  04/11/17 155 lb (70.3 kg)  04/06/17 151 lb 8 oz (68.7 kg)  03/27/17 155 lb 2 oz (70.4 kg)   Temp Readings from Last 3  Encounters:  04/11/17 98.4 F (36.9 C) (Oral)  04/06/17 98.7 F (37.1 C) (Oral)  03/29/17 99.8 F (37.7 C) (Oral)   BP Readings from Last 3 Encounters:  04/11/17 108/79  04/06/17 122/78  03/29/17 (!) 105/59   Pulse Readings from Last 3 Encounters:  04/11/17 68  04/06/17 70  03/29/17 82   Pain Assessment Pain Score: 2  Pain Loc: Breast/10  In general this is a well appearing caucasian female in no acute distress. She is alert and oriented x4 and appropriate throughout the examination. HEENT reveals that the patient is normocephalic, atraumatic. EOMs are intact. PERRLA. Skin is intact without any evidence of gross lesions.  Cardiopulmonary assessment is negative for acute distress and she exhibits normal effort.  Bilateral breast exam is performed.  The right side is healing well with some mild ecchymosis along the right nipple.  Her expander is in place without erythema along any of the incision sites.  The left breast is similar, without evidence of infectious concerns, and her incision site is also healing well, with an in situ expander.    ECOG = 0  0 - Asymptomatic (Fully active, able to carry on all predisease activities without restriction)  1 - Symptomatic but completely ambulatory (Restricted in physically strenuous activity but ambulatory and able to carry out work of a light or sedentary nature. For example, light housework, office work)  2 - Symptomatic, <50% in bed during the day (Ambulatory and capable of all self care but unable to carry out any work activities. Up and about more than 50% of waking hours)  3 - Symptomatic, >50% in bed, but not bedbound (Capable of only limited self-care, confined to bed or chair 50% or more of waking hours)  4 - Bedbound (Completely disabled. Cannot carry on any self-care. Totally confined to bed or chair)  5 - Death   Eustace Pen MM, Creech RH, Tormey DC, et al. [phone]). "Toxicity and response criteria of the [facility]". [place] Oncol. 5 (6): 649-55    LABORATORY DATA:  Lab Results  Component Value Date   WBC 4.6 04/06/2017   HGB 8.4 (L) 04/06/2017   HCT 25.4 (L) 04/06/2017   MCV 100.4 04/06/2017   PLT 310 04/06/2017   Lab Results  Component Value Date   NA 138 04/06/2017   K 4.0 04/06/2017   CL 103 04/06/2017   CO2 27 04/06/2017   Lab Results  Component Value Date   ALT 21 04/06/2017  AST 24 04/06/2017   ALKPHOS 88 04/06/2017   BILITOT 0.2 04/06/2017      RADIOGRAPHY: Nm Sentinel Node Inj-no Rpt (breast)  Result Date: 03/27/2017 Sulfur colloid was injected by the nuclear medicine technologist for melanoma sentinel node.   Mm Breast Surgical Specimen  Result Date: 03/27/2017 CLINICAL DATA:  Post excision of previously biopsied left axillary lymph node. EXAM: SPECIMEN RADIOGRAPH OF THE LEFT AXILLARY LYMPH NODE COMPARISON:  Previous exam(s). FINDINGS: Status post excision of the previously biopsied left axillary lymph node. The radioactive seed and spiral shaped HydroMARK biopsy marker clip are present, completely intact, and were marked for pathology. IMPRESSION: Specimen radiograph post excision of left axillary lymph node. Electronically Signed   By: Jennifer  Jarosz M.D.   On: 03/27/2017 09:18   Us Lt Radioactive Seed Loc  Result Date: 03/24/2017 CLINICAL DATA:  Localization previously biopsied left axillary lymph node. EXAM: ULTRASOUND GUIDED RADIOACTIVE SEED LOCALIZATION OF THE LEFT BREAST COMPARISON:  Previous exam(s). FINDINGS: Patient presents for radioactive seed localization prior to surgery. I met with the patient and we discussed the procedure of seed localization including benefits and alternatives. We discussed the high likelihood of a successful procedure. We discussed the risks of the procedure including infection, bleeding, tissue injury and further surgery. We discussed the low dose of radioactivity involved in the procedure. Informed, written consent was given.  The usual time-out protocol was performed immediately prior to the procedure. Using ultrasound guidance, sterile technique, 1% lidocaine and an I-125 radioactive seed, the HydroMARK clip within the biopsied lymph node was localized using a lateral approach. The follow-up mammogram images confirm the seed in the expected location and were marked for Dr. Wakefield. Follow-up survey of the patient confirms presence of the radioactive seed. Order number of I-125 seed:  [id number]. Total activity:  0.248 millicuries reference Date: March 17, 2017 The patient tolerated the procedure well and was [facility]. She was given instructions regarding seed removal. IMPRESSION: Radioactive seed localization left breast. No apparent complications. Electronically Signed   By: David  Williams III M.D   On: 03/24/2017 15:00   Mm Clip Placement Left  Result Date: 03/24/2017 CLINICAL DATA:  Evaluate seed placement. EXAM: DIAGNOSTIC LEFT MAMMOGRAM POST RADIOACTIVE SEED PLACEMENT COMPARISON:  Previous exam(s). FINDINGS: Mammographic images were obtained following ultrasound guided radioactive seed placement. IMPRESSION: The radioactive seed is immediately adjacent to the coil shaped clip within the biopsied lymph node. Final Assessment: Post Procedure Mammograms for Marker Placement Electronically Signed   By: David  Williams III M.D   On: 03/24/2017 14:59       IMPRESSION/PLAN: 1. 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 Dr. Moody discusses the pathology findings and reviews the nature of invasive node positive disease. He has reviewed her case to date and recommends a course of post mastectomy radiotherapy to the chest wall and regional nodes. He discusses the utility of this therapy. We discussed the risks, benefits, short, and long term effects of radiotherapy, and the patient is interested in proceeding. Dr. Moody discusses the delivery  and logistics of radiotherapy and anticipates a course of 6 1/2 weeks. We will coordinate simulation to occur in about 2-3 weeks, but will lean on Dr. Thimmappa for guidance on when to proceed given the patient is in the process of getting ready to start her expansion of her tissue expanders.  The patient will be seen next week, and I will follow-up with her in   the next 2 weeks to determine a time for simulation.  In a visit lasting 45 minutes, greater than 50% of the time was spent face to face discussing her course, and coordinating the patient's care.  The above documentation reflects my direct findings during this shared patient visit. Please see the separate note by Dr. Moody on this date for the remainder of the patient's plan of care.    Alison C. Perkins, PAC  

## 2017-04-17 ENCOUNTER — Other Ambulatory Visit: Payer: Self-pay | Admitting: Hematology and Oncology

## 2017-04-20 ENCOUNTER — Other Ambulatory Visit: Payer: 59

## 2017-04-20 ENCOUNTER — Encounter: Payer: Self-pay | Admitting: Radiation Oncology

## 2017-04-20 ENCOUNTER — Ambulatory Visit: Payer: 59

## 2017-04-21 ENCOUNTER — Encounter: Payer: Self-pay | Admitting: Hematology and Oncology

## 2017-04-21 ENCOUNTER — Telehealth: Payer: Self-pay | Admitting: *Deleted

## 2017-04-21 NOTE — Telephone Encounter (Signed)
Patient called in with questions about when her simulation would be.  She stated she was told to call Bryson Ha once she was cleared by Dr. Iran Planas.  She called to see if she needed to keep her current simulation appointment scheduled for 05/02/2017 or if they want her to come in sooner.  For now we will keep the scheduled appointment and I will follow up with Bryson Ha and Dr. [name] on Monday.  I informed her that I would give her a call back if anything should change.  Will continue to follow as necessary.  Cori Razor, RN

## 2017-04-24 ENCOUNTER — Telehealth: Payer: Self-pay | Admitting: *Deleted

## 2017-04-24 ENCOUNTER — Encounter: Payer: Self-pay | Admitting: Rehabilitation

## 2017-04-24 ENCOUNTER — Ambulatory Visit: Payer: 59 | Attending: Plastic Surgery | Admitting: Rehabilitation

## 2017-04-24 DIAGNOSIS — M6281 Muscle weakness (generalized): Secondary | ICD-10-CM | POA: Diagnosis present

## 2017-04-24 DIAGNOSIS — M25612 Stiffness of left shoulder, not elsewhere classified: Secondary | ICD-10-CM | POA: Insufficient documentation

## 2017-04-24 DIAGNOSIS — M25611 Stiffness of right shoulder, not elsewhere classified: Secondary | ICD-10-CM | POA: Insufficient documentation

## 2017-04-24 DIAGNOSIS — Z483 Aftercare following surgery for neoplasm: Secondary | ICD-10-CM | POA: Diagnosis not present

## 2017-04-24 NOTE — Telephone Encounter (Signed)
Left voicemail letting the patient know that her simulation has been rescheduled to Friday 04/28/2017 at 8 am.  Left a call back number if she had any questions or concerns.  Will continue to monitor as necessary.  Cori Razor, RN

## 2017-04-24 NOTE — Therapy (Signed)
[place], [place], [address] Phone: [phone]   Fax:  [phone]  Physical Therapy Evaluation  Patient Details  Name: Laura Anthony MRN: [id number] Date of Birth: May 07, 1975 Referring Provider: Irene Limbo, MD   Encounter Date: 04/24/2017  PT End of Session - 04/24/17 1727    Visit Number  1    Number of Visits  12    Date for PT Re-Evaluation  05/29/17    Authorization Type  [employer]    PT Start Time  5681    PT Stop Time  1515    PT Time Calculation (min)  43 min    Activity Tolerance  Patient tolerated treatment well    Behavior During Therapy  [facility] for tasks assessed/performed       Past Medical History:  Diagnosis Date  . ADD (attention deficit disorder)   . Anemia   . Anxiety   . 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 Breast cancer, left breast ([place])    S/P mastectomy 03/27/2017  . DVT (deep venous thrombosis) ([place]) 2017   calf left - probably due to [facility] pills-took eliquis x3 mos, nonthing now  . Family history of breast cancer   . Family history of prostate cancer   . High cholesterol   . Impingement syndrome of right shoulder 07/2013  . Migraine    "usually 1/month" (03/28/2017)  . PONV (postoperative nausea and vomiting)   . Right bicipital tenosynovitis 07/2013  . Rotator cuff impingement syndrome of right shoulder 07/12/2013  . Seizures ([place])    x 1 as a child - was never on anticonvulsants (03/28/2017)    Past Surgical History:  Procedure Laterality Date  . ADENOIDECTOMY  1981  . ANKLE ARTHROSCOPY Right   . BREAST BIOPSY Left 10/2016  . KNEE ARTHROSCOPY Right   . KNEE ARTHROSCOPY W/ ACL RECONSTRUCTION Left   . MASTECTOMY Left 03/27/2017   NIPPLE SPARING MASTECTOMY WITH RADIOACTIVE SEED TARGETED LYMPH NODE EXCISION AND LEFT AXILLARY SENTINEL LYMPH NODE BIOPSY  . MASTECTOMY Right 03/27/2017   RIGHT PROPHYLACTIC NIPPLE SPARING  MASTECTOMY  . NIPPLE SPARING MASTECTOMY Right 03/27/2017   Procedure: RIGHT PROPHYLACTIC NIPPLE SPARING MASTECTOMY;  Surgeon: Rolm Bookbinder, MD;  Location: [place];  Service: General;  Laterality: Right;  . PORTACATH PLACEMENT N/A 11/01/2016   Procedure: INSERTION PORT-A-CATH WITH [place];  Surgeon: Rolm Bookbinder, MD;  Location: [place];  Service: General;  Laterality: N/A;  . RADIOACTIVE SEED GUIDED AXILLARY SENTINEL LYMPH NODE Left 03/27/2017   Procedure: LEFT NIPPLE SPARING MASTECTOMY WITH RADIOACTIVE SEED TARGETED LYMPH NODE EXCISION AND LEFT AXILLARY SENTINEL LYMPH NODE BIOPSY;  Surgeon: Rolm Bookbinder, MD;  Location: [place];  Service: General;  Laterality: Left;  REQUESTS RNFA  . RECONSTRUCTION BREAST IMMEDIATE / DELAYED W/ TISSUE EXPANDER Bilateral 03/27/2017   BILATERAL BREAST RECONSTRUCTION WITH PLACEMENT OF TISSUE EXPANDER AND ALLODERM  . SEPTOPLASTY WITH ETHMOIDECTOMY, AND MAXILLARY ANTROSTOMY  10/29/2010   bilat. max. antrostomy with left max. stripping; left ant. ethmoidectomy; right total ethmoidectomy; sphenoidotomy  . SHOULDER ARTHROSCOPY WITH SUBACROMIAL DECOMPRESSION AND BICEP TENDON REPAIR Right 07/12/2013   Procedure: RIGHT SHOULDER ARTHROSCOPY DEBRIDEMENT EXTENSIVE DECOMPRESSION SUBACROMIAL PARTIAL ACROMIOPLASTY;  Surgeon: Johnny Bridge, MD;  Location: [place];  Service: Orthopedics;  Laterality: Right;  . WRIST ARTHROSCOPY  01/17/2012   Procedure: ARTHROSCOPY WRIST; right wrist Surgeon: Tennis Must, MD;  Location: [place];  Service: Orthopedics;  Laterality: Right;  RIGHT WRIST ARTHROSCOPY WITH TRIANGULAR FIBROCARTILAGE COMPLEX REPAIR AND DEBRIDEMENT     There were no vitals filed for this visit.   Subjective Assessment - 04/24/17 1439    Subjective  Pt presents with complaints of L shoulder decreased ROM with ADLs.  The stiffness is staying about the same just stiff.  Denies swelling overall.       Pertinent History  R RC surgery, bil mastectomy, neoadjuvant chemotherapy currently getting herceptin, starting radiation next week, currently has breast expanders bilaterally    Limitations  Lifting    Patient Stated Goals  improve shoulder ROM, get back to normal active status    Currently in Pain?  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   No    Is the patient reluctant to leave their home because of a fear of falling?   No      Home Environment   Living Environment  Private residence    Available Help at Discharge  Family      Prior Function   Level of Independence  Independent    Vocation  Unemployed    Vocation Requirements  mom of 3 kids    Leisure  horses, tennis, running       Cognition   Overall Cognitive Status  Within Functional Limits for tasks assessed      Observation/Other Assessments   Observations  well healed inferior incisions bil breast, axillary L, and drain port holes      Coordination   Gross Motor Movements are Fluid and Coordinated  Yes      Posture/Postural Control   Posture/Postural Control  No significant limitations      ROM / Strength   AROM / PROM / Strength  AROM;PROM;Strength      AROM   AROM Assessment Site  Shoulder    Right/Left Shoulder  Right;Left    Right Shoulder Flexion  159 Degrees    Right Shoulder ABduction  170 Degrees    Right Shoulder Internal Rotation  -- To T6    Right Shoulder  External Rotation  -- 100%    Right Shoulder Horizontal ABduction  30 Degrees    Left Shoulder Flexion  155 Degrees    Left Shoulder ABduction  118 Degrees    Left Shoulder Internal Rotation  -- To T6    Left Shoulder External Rotation  -- 85% behind head    Left Shoulder Horizontal ABduction  10 Degrees      PROM   PROM Assessment Site  Shoulder    Right/Left Shoulder  Left    Left Shoulder Flexion  160 Degrees    Left Shoulder ABduction  120 Degrees  Left Shoulder External Rotation  80 Degrees        LYMPHEDEMA/ONCOLOGY QUESTIONNAIRE - 04/24/17 1442      Type   Cancer Type  L breast ER/PR neg and HER2 positive      Surgeries   Mastectomy Date  03/27/17 bilateral    Sentinel Lymph Node Biopsy Date  03/27/17    Other Surgery Date  03/27/17 breast expanders bilateral    Number Lymph Nodes Removed  3      Treatment   Active Chemotherapy Treatment  No    Date  -- herceptin and progeta every 21 days    Past Chemotherapy Treatment  Yes    Date  11/04/17    Active Radiation Treatment  -- should start next week sometime      Lymphedema Assessments   Lymphedema Assessments  Upper extremities      Right Upper Extremity Lymphedema   15 cm Proximal to Olecranon Process  29.4 cm    10 cm Proximal to Olecranon Process  27.8 cm    Olecranon Process  24 cm    15 cm Proximal to Ulnar Styloid Process  24.4 cm    10 cm Proximal to Ulnar Styloid Process  20.5 cm    Just Proximal to Ulnar Styloid Process  16 cm    Across Hand at [employer]  19.7 cm    At [place] of 2nd Digit  6.5 cm      Left Upper Extremity Lymphedema   15 cm Proximal to Olecranon Process  29 cm    10 cm Proximal to Olecranon Process  27 cm    Olecranon Process  24 cm    15 cm Proximal to Ulnar Styloid Process  22.3 cm    10 cm Proximal to Ulnar Styloid Process  18.6 cm    Just Proximal to Ulnar Styloid Process  15.7 cm    Across Hand at [employer]  19 cm    At [place] of 2nd Digit  6.2 cm           Quick Dash - 04/24/17 0001    Open a tight or new jar  No difficulty    Do heavy household chores (wash walls, wash floors)  Moderate difficulty    Carry a shopping bag or briefcase  No difficulty    Wash your back  Severe difficulty    Use a knife to cut food  No difficulty    Recreational activities in which you take some force or impact through your arm, shoulder, or hand (golf, hammering, tennis)  Moderate difficulty    During the past week, to what extent has your arm, shoulder or hand problem interfered with your normal social activities with family, friends, neighbors, or groups?  Slightly    During the past week, to what extent has your arm, shoulder or hand problem limited your work or other regular daily activities  Modererately    Arm, shoulder, or hand pain.  None    Tingling (pins and needles) in your arm, shoulder, or hand  None    Difficulty Sleeping  Mild difficulty    DASH Score  25 %      No data recorded  Objective measurements completed on examination: See above findings.      [facility] Adult PT Treatment/Exercise - 04/24/17 0001      Exercises   Exercises  Shoulder      Shoulder Exercises: ROM/Strengthening   Other ROM/Strengthening Exercises  performed supine flexion AAROM with hand hold, and ER butterfly stretch per HEP, also seated medial nerve glide with wrist flexion/extension              PT Education - 04/24/17 1725    Education provided  Yes    Education Details  gave patient ABC post-op stretches handout for HEP along with seated median nerve glide with wrist flexion/extension flossing with shoulder abduction to tolerance    Person(s) Educated  Patient    Methods  Handout;Explanation;Demonstration    Comprehension  Verbalized understanding;Returned demonstration          PT Long Term Goals - 04/24/17 1736      PT LONG TERM GOAL #1   Title  Pt will improve QDASH score to 20% limited or less    Baseline  25%    Time  5     Period  Weeks    Status  New    Target Date  05/29/17      PT LONG TERM GOAL #2   Title  Pt will improve L shoulder AROM to equal with the R UE    Time  5    Period  Weeks    Status  New    Target Date  05/29/17      PT LONG TERM GOAL #3   Title  PT/PTA will perform MMT of the UEs with appropriate goal as indicated    Time  5    Period  Weeks    Status  New    Target Date  05/29/17      PT LONG TERM GOAL #4   Title  Pt will be able to tolerate radiation treatment without increased pain    Time  5    Period  Weeks    Status  New    Target Date  05/29/17      PT LONG TERM GOAL #5   Title  Pt will report no limitations in ADLs due to the UEs    Time  5    Period  Weeks    Status  New    Target Date  05/29/17             Plan - 04/24/17 1727    Clinical Impression Statement  Pt presents one month post bilateral mastectomy with bilateral breast expanders present with complaints of decreased shoulder ROM and decreased activity tolerance.  Pt will be starting radiation next week and has concerns about getting the arm overhead for treatment.  She is currently getting herceptin every 21 days and has completed chemotherapy.  Her L shoulder AROM is limited into flexion, abduction, horizontal abduction, and end range ER without significant pain.  Her R UE is only slightly limited     History and Personal Factors relevant to plan of care:  bil mastectomy due to L breast cancer ER/PR neg and HER2 positive, starting radiation, L RC repair    Clinical Presentation  Evolving    Clinical Presentation due to:  starting radiation, still needs implants bil    Clinical Decision Making  Moderate    Rehab Potential  Excellent    PT Frequency  -- 4x per week x 1 week and then 2x per week x 4 weeks    PT Treatment/Interventions  Therapeutic exercise;Manual techniques;Passive range of motion    PT Next Visit Plan  L shoulder ROM focus    PT Home Exercise Plan  given post op breast stretches  and  median nerve glide 3/25    Consulted and Agree with Plan of Care  Patient       Patient will benefit from skilled therapeutic intervention in order to improve the following deficits and impairments:  Decreased skin integrity, Decreased mobility, Decreased scar mobility, Decreased activity tolerance, Decreased range of motion, Decreased strength  Visit Diagnosis: Aftercare following surgery for neoplasm - Plan: PT plan of care cert/re-cert  Stiffness of left shoulder, not elsewhere classified - Plan: PT plan of care cert/re-cert  Stiffness of right shoulder, not elsewhere classified - Plan: PT plan of care cert/re-cert  Muscle weakness (generalized) - Plan: PT plan of care cert/re-cert     Problem List Patient Active Problem List   Diagnosis Date Noted  . Breast cancer, stage 2, left ([place]) 03/27/2017  . Port-A-Cath in place 12/16/2016  . Genetic testing 12/06/2016  . Encounter for antineoplastic chemotherapy 11/25/2016  . Family history of breast cancer   . Family history of prostate cancer   . Malignant neoplasm of lower-outer quadrant of left breast of female, estrogen receptor negative ([place]) 10/27/2016  . Rotator cuff impingement syndrome of left shoulder 07/12/2013    Shan Levans, PT 04/24/2017, 5:41 PM  [place], [place], [address] Phone: [phone]   Fax:  [phone]  Name: Annaleise Burger MRN: [id number] Date of Birth: 09-Jun-1975

## 2017-04-24 NOTE — Telephone Encounter (Signed)
-----   Message from Hayden Pedro, PA-C sent at 04/24/2017  8:53 AM EDT ----- Regarding: FW: CT Simulation Yea its fine if she's done with expansion. If you want to let Sim know they can call her to set up this week if she'd like ----- Message ----- From: Cori Razor, RN Sent: 04/21/2017   2:34 PM To: Hayden Pedro, PA-C Subject: CT Simulation                                  Zosia called today and she said that she saw Dr. Iran Planas and she said her expansion is complete.  She wanted to know if her simulation would be moved up or if it will still be on 05/02/2017?  She said she could do simulation and start as early as next week.  I told her I would get back to her if her appointment is changed from the above date.  Phineas Real

## 2017-04-25 ENCOUNTER — Ambulatory Visit: Payer: 59

## 2017-04-25 DIAGNOSIS — Z483 Aftercare following surgery for neoplasm: Secondary | ICD-10-CM | POA: Diagnosis not present

## 2017-04-25 DIAGNOSIS — M25611 Stiffness of right shoulder, not elsewhere classified: Secondary | ICD-10-CM

## 2017-04-25 DIAGNOSIS — M25612 Stiffness of left shoulder, not elsewhere classified: Secondary | ICD-10-CM

## 2017-04-25 NOTE — Therapy (Signed)
[place], [place], [address] Phone: [phone]   Fax:  [phone]  Physical Therapy Treatment  Patient Details  Name: Laura Anthony MRN: [id number] Date of Birth: 1975-10-18 Referring Provider: Irene Limbo, MD   Encounter Date: 04/25/2017  PT End of Session - 04/25/17 1026    Visit Number  2    Number of Visits  12    Date for PT Re-Evaluation  05/29/17    PT Start Time  0941    PT Stop Time  1028    PT Time Calculation (min)  47 min    Activity Tolerance  Patient tolerated treatment well    Behavior During Therapy  [facility] for tasks assessed/performed       Past Medical History:  Diagnosis Date  . ADD (attention deficit disorder)   . Anemia   . Anxiety   . 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 Breast cancer, left breast ([place])    S/P mastectomy 03/27/2017  . DVT (deep venous thrombosis) ([place]) 2017   calf left - probably due to [facility] pills-took eliquis x3 mos, nonthing now  . Family history of breast cancer   . Family history of prostate cancer   . High cholesterol   . Impingement syndrome of right shoulder 07/2013  . Migraine    "usually 1/month" (03/28/2017)  . PONV (postoperative nausea and vomiting)   . Right bicipital tenosynovitis 07/2013  . Rotator cuff impingement syndrome of right shoulder 07/12/2013  . Seizures ([place])    x 1 as a child - was never on anticonvulsants (03/28/2017)    Past Surgical History:  Procedure Laterality Date  . ADENOIDECTOMY  1981  . ANKLE ARTHROSCOPY Right   . BREAST BIOPSY Left 10/2016  . KNEE ARTHROSCOPY Right   . KNEE ARTHROSCOPY W/ ACL RECONSTRUCTION Left   . MASTECTOMY Left 03/27/2017   NIPPLE SPARING MASTECTOMY WITH RADIOACTIVE SEED TARGETED LYMPH NODE EXCISION AND LEFT AXILLARY SENTINEL LYMPH NODE BIOPSY  . MASTECTOMY Right 03/27/2017   RIGHT PROPHYLACTIC NIPPLE SPARING MASTECTOMY  . NIPPLE SPARING  MASTECTOMY Right 03/27/2017   Procedure: RIGHT PROPHYLACTIC NIPPLE SPARING MASTECTOMY;  Surgeon: Rolm Bookbinder, MD;  Location: [place];  Service: General;  Laterality: Right;  . PORTACATH PLACEMENT N/A 11/01/2016   Procedure: INSERTION PORT-A-CATH WITH [place];  Surgeon: Rolm Bookbinder, MD;  Location: [place];  Service: General;  Laterality: N/A;  . RADIOACTIVE SEED GUIDED AXILLARY SENTINEL LYMPH NODE Left 03/27/2017   Procedure: LEFT NIPPLE SPARING MASTECTOMY WITH RADIOACTIVE SEED TARGETED LYMPH NODE EXCISION AND LEFT AXILLARY SENTINEL LYMPH NODE BIOPSY;  Surgeon: Rolm Bookbinder, MD;  Location: [place];  Service: General;  Laterality: Left;  REQUESTS RNFA  . RECONSTRUCTION BREAST IMMEDIATE / DELAYED W/ TISSUE EXPANDER Bilateral 03/27/2017   BILATERAL BREAST RECONSTRUCTION WITH PLACEMENT OF TISSUE EXPANDER AND ALLODERM  . SEPTOPLASTY WITH ETHMOIDECTOMY, AND MAXILLARY ANTROSTOMY  10/29/2010   bilat. max. antrostomy with left max. stripping; left ant. ethmoidectomy; right total ethmoidectomy; sphenoidotomy  . SHOULDER ARTHROSCOPY WITH SUBACROMIAL DECOMPRESSION AND BICEP TENDON REPAIR Right 07/12/2013   Procedure: RIGHT SHOULDER ARTHROSCOPY DEBRIDEMENT EXTENSIVE DECOMPRESSION SUBACROMIAL PARTIAL ACROMIOPLASTY;  Surgeon: Johnny Bridge, MD;  Location: [place];  Service: Orthopedics;  Laterality: Right;  . WRIST ARTHROSCOPY  01/17/2012   Procedure: ARTHROSCOPY WRIST; right wrist Surgeon: Tennis Must, MD;  Location: [place];  Service: Orthopedics;  Laterality: Right;  RIGHT WRIST ARTHROSCOPY  WITH TRIANGULAR FIBROCARTILAGE COMPLEX REPAIR AND DEBRIDEMENT     There were no vitals filed for this visit.  Subjective Assessment - 04/25/17 0942    Subjective  Nothing new since I was here yesterday fo rmy evaluation except I did a few of the exercises last night and they were fine.     Pertinent History  R RC surgery, bil mastectomy,  neoadjuvant chemotherapy currently getting herceptin, starting radiation next week, currently has breast expanders bilaterally    Patient Stated Goals  improve shoulder ROM, get back to normal active status    Currently in Pain?  No/denies            LYMPHEDEMA/ONCOLOGY QUESTIONNAIRE - 04/24/17 1442      Type   Cancer Type  L breast ER/PR neg and HER2 positive      Surgeries   Mastectomy Date  03/27/17 bilateral    Sentinel Lymph Node Biopsy Date  03/27/17    Other Surgery Date  03/27/17 breast expanders bilateral    Number Lymph Nodes Removed  3      Treatment   Active Chemotherapy Treatment  No    Date  -- herceptin and progeta every 21 days    Past Chemotherapy Treatment  Yes    Date  11/04/17    Active Radiation Treatment  -- should start next week sometime      Lymphedema Assessments   Lymphedema Assessments  Upper extremities      Right Upper Extremity Lymphedema   15 cm Proximal to Olecranon Process  29.4 cm    10 cm Proximal to Olecranon Process  27.8 cm    Olecranon Process  24 cm    15 cm Proximal to Ulnar Styloid Process  24.4 cm    10 cm Proximal to Ulnar Styloid Process  20.5 cm    Just Proximal to Ulnar Styloid Process  16 cm    Across Hand at [employer]  19.7 cm    At [place] of 2nd Digit  6.5 cm      Left Upper Extremity Lymphedema   15 cm Proximal to Olecranon Process  29 cm    10 cm Proximal to Olecranon Process  27 cm    Olecranon Process  24 cm    15 cm Proximal to Ulnar Styloid Process  22.3 cm    10 cm Proximal to Ulnar Styloid Process  18.6 cm    Just Proximal to Ulnar Styloid Process  15.7 cm    Across Hand at [employer]  19 cm    At [place] of 2nd Digit  6.2 cm         No data recorded       [facility] Adult PT Treatment/Exercise - 04/25/17 0001      Shoulder Exercises: Pulleys   Flexion  2 minutes;Limitations    Flexion Limitations  Pt returned therapist demonstration and VCs throughout to relax Lt upper trap and scapular  compensation    ABduction  2 minutes;Limitations    ABduction Limitations  VCs and demonstration for technique      Shoulder Exercises: Therapy Ball   Flexion  Both;10 reps With forward lean into end of stretch    ABduction  Left;5 reps With same side lean into end of stretch      Manual Therapy   Manual Therapy  Myofascial release;Passive ROM    Myofascial Release  To Lt axilla where cording was palpable today deeper in axilla  Passive ROM  In Supine to Lt shoulder into flexion, abduction, er and D2 all to pts tolerance. End ROM significantly improved by end of session.             PT Education - 04/24/17 1725    Education provided  Yes    Education Details  gave patient ABC post-op stretches handout for HEP along with seated median nerve glide with wrist flexion/extension flossing with shoulder abduction to tolerance    Person(s) Educated  Patient    Methods  Handout;Explanation;Demonstration    Comprehension  Verbalized understanding;Returned demonstration          PT Long Term Goals - 04/24/17 1736      PT LONG TERM GOAL #1   Title  Pt will improve QDASH score to 20% limited or less    Baseline  25%    Time  5    Period  Weeks    Status  New    Target Date  05/29/17      PT LONG TERM GOAL #2   Title  Pt will improve L shoulder AROM to equal with the R UE    Time  5    Period  Weeks    Status  New    Target Date  05/29/17      PT LONG TERM GOAL #3   Title  PT/PTA will perform MMT of the UEs with appropriate goal as indicated    Time  5    Period  Weeks    Status  New    Target Date  05/29/17      PT LONG TERM GOAL #4   Title  Pt will be able to tolerate radiation treatment without increased pain    Time  5    Period  Weeks    Status  New    Target Date  05/29/17      PT LONG TERM GOAL #5   Title  Pt will report no limitations in ADLs due to the UEs    Time  5    Period  Weeks    Status  New    Target Date  05/29/17            Plan -  04/25/17 1027    Clinical Impression Statement  PT tolerated first session of P/AA/ROM very well. Cording was palpable in Lt axilla with end ROM stretching so also focused myofascial release to this area. Overall her ROM had visibly significantly improved by end of session and pt reported noticing improvement as well.     Rehab Potential  Excellent    PT Frequency  -- 4x/wk x1wk, then2x/wk x4 wks    PT Duration  -- 5 weeks    PT Treatment/Interventions  Therapeutic exercise;Manual techniques;Passive range of motion    PT Next Visit Plan  Lt shoulder end ROM focus for radiation positioning (simulation is Friday)    Consulted and Agree with Plan of Care  Patient       Patient will benefit from skilled therapeutic intervention in order to improve the following deficits and impairments:  Decreased skin integrity, Decreased mobility, Decreased scar mobility, Decreased activity tolerance, Decreased range of motion, Decreased strength  Visit Diagnosis: Aftercare following surgery for neoplasm  Stiffness of left shoulder, not elsewhere classified  Stiffness of right shoulder, not elsewhere classified     Problem List Patient Active Problem List   Diagnosis Date Noted  . Breast cancer, stage 2, left ([place]) 03/27/2017  .  Port-A-Cath in place 12/16/2016  . Genetic testing 12/06/2016  . Encounter for antineoplastic chemotherapy 11/25/2016  . Family history of breast cancer   . Family history of prostate cancer   . Malignant neoplasm of lower-outer quadrant of left breast of female, estrogen receptor negative ([place]) 10/27/2016  . Rotator cuff impingement syndrome of left shoulder 07/12/2013    Otelia Limes, PTA 04/25/2017, 10:36 AM  [place], [place], [address] Phone: [phone]   Fax:  [phone]  Name: Laura Anthony MRN: [id number] Date of Birth: Mar 13, 1975

## 2017-04-26 ENCOUNTER — Encounter: Payer: Self-pay | Admitting: Rehabilitation

## 2017-04-26 ENCOUNTER — Ambulatory Visit: Payer: 59 | Admitting: Rehabilitation

## 2017-04-26 DIAGNOSIS — Z483 Aftercare following surgery for neoplasm: Secondary | ICD-10-CM | POA: Diagnosis not present

## 2017-04-26 DIAGNOSIS — M25611 Stiffness of right shoulder, not elsewhere classified: Secondary | ICD-10-CM

## 2017-04-26 DIAGNOSIS — M25612 Stiffness of left shoulder, not elsewhere classified: Secondary | ICD-10-CM

## 2017-04-26 NOTE — Therapy (Signed)
[place], [place], [address] Phone: [phone]   Fax:  [phone]  Physical Therapy Treatment  Patient Details  Name: Laura Anthony MRN: [id number] Date of Birth: 15-Nov-1975 Referring Provider: Irene Limbo, MD   Encounter Date: 04/26/2017  PT End of Session - 04/26/17 1413    Visit Number  3    Number of Visits  12    Date for PT Re-Evaluation  05/29/17    Authorization Type  U[employer]    PT Start Time  1350    PT Stop Time  1429    PT Time Calculation (min)  39 min    Activity Tolerance  Patient tolerated treatment well    Behavior During Therapy  [facility] for tasks assessed/performed       Past Medical History:  Diagnosis Date  . ADD (attention deficit disorder)   . Anemia   . Anxiety   . 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 Breast cancer, left breast ([place])    S/P mastectomy 03/27/2017  . DVT (deep venous thrombosis) ([place]) 2017   calf left - probably due to [facility] pills-took eliquis x3 mos, nonthing now  . Family history of breast cancer   . Family history of prostate cancer   . High cholesterol   . Impingement syndrome of right shoulder 07/2013  . Migraine    "usually 1/month" (03/28/2017)  . PONV (postoperative nausea and vomiting)   . Right bicipital tenosynovitis 07/2013  . Rotator cuff impingement syndrome of right shoulder 07/12/2013  . Seizures ([place])    x 1 as a child - was never on anticonvulsants (03/28/2017)    Past Surgical History:  Procedure Laterality Date  . ADENOIDECTOMY  1981  . ANKLE ARTHROSCOPY Right   . BREAST BIOPSY Left 10/2016  . KNEE ARTHROSCOPY Right   . KNEE ARTHROSCOPY W/ ACL RECONSTRUCTION Left   . MASTECTOMY Left 03/27/2017   NIPPLE SPARING MASTECTOMY WITH RADIOACTIVE SEED TARGETED LYMPH NODE EXCISION AND LEFT AXILLARY SENTINEL LYMPH NODE BIOPSY  . MASTECTOMY Right 03/27/2017   RIGHT PROPHYLACTIC NIPPLE SPARING  MASTECTOMY  . NIPPLE SPARING MASTECTOMY Right 03/27/2017   Procedure: RIGHT PROPHYLACTIC NIPPLE SPARING MASTECTOMY;  Surgeon: Rolm Bookbinder, MD;  Location: [place];  Service: General;  Laterality: Right;  . PORTACATH PLACEMENT N/A 11/01/2016   Procedure: INSERTION PORT-A-CATH WITH [place];  Surgeon: Rolm Bookbinder, MD;  Location: [place];  Service: General;  Laterality: N/A;  . RADIOACTIVE SEED GUIDED AXILLARY SENTINEL LYMPH NODE Left 03/27/2017   Procedure: LEFT NIPPLE SPARING MASTECTOMY WITH RADIOACTIVE SEED TARGETED LYMPH NODE EXCISION AND LEFT AXILLARY SENTINEL LYMPH NODE BIOPSY;  Surgeon: Rolm Bookbinder, MD;  Location: [place];  Service: General;  Laterality: Left;  REQUESTS RNFA  . RECONSTRUCTION BREAST IMMEDIATE / DELAYED W/ TISSUE EXPANDER Bilateral 03/27/2017   BILATERAL BREAST RECONSTRUCTION WITH PLACEMENT OF TISSUE EXPANDER AND ALLODERM  . SEPTOPLASTY WITH ETHMOIDECTOMY, AND MAXILLARY ANTROSTOMY  10/29/2010   bilat. max. antrostomy with left max. stripping; left ant. ethmoidectomy; right total ethmoidectomy; sphenoidotomy  . SHOULDER ARTHROSCOPY WITH SUBACROMIAL DECOMPRESSION AND BICEP TENDON REPAIR Right 07/12/2013   Procedure: RIGHT SHOULDER ARTHROSCOPY DEBRIDEMENT EXTENSIVE DECOMPRESSION SUBACROMIAL PARTIAL ACROMIOPLASTY;  Surgeon: Johnny Bridge, MD;  Location: [place];  Service: Orthopedics;  Laterality: Right;  . WRIST ARTHROSCOPY  01/17/2012   Procedure: ARTHROSCOPY WRIST; right wrist Surgeon: Tennis Must, MD;  Location: [place];  Service: Orthopedics;  Laterality: Right;  RIGHT WRIST ARTHROSCOPY WITH TRIANGULAR FIBROCARTILAGE COMPLEX REPAIR AND DEBRIDEMENT     There were no vitals filed for this visit.  Subjective Assessment - 04/26/17 1355    Subjective  I was a bit sore yesterday but better this morning, I was able to take my shirt off normally this morning    Pertinent History  R RC surgery, bil  mastectomy, neoadjuvant chemotherapy currently getting herceptin, starting radiation next week, currently has breast expanders bilaterally    Patient Stated Goals  improve shoulder ROM, get back to normal active status    Currently in Pain?  No/denies                No data recorded       [facility] Adult PT Treatment/Exercise - 04/26/17 0001      Shoulder Exercises: Pulleys   Flexion  2 minutes    ABduction  2 minutes      Shoulder Exercises: Therapy Ball   Flexion  Both;10 reps with lift off    ABduction  Both;5 reps      Shoulder Exercises: Stretch   Other Shoulder Stretches  towel stretch easy, back against wall abduction angels x 5 bil , bil back against wall flexion x 5      Manual Therapy   Myofascial Release  L axilla and upper arm in stretch position    Passive ROM  In Supine to Lt shoulder into flexion, abduction, er to tolerance                  PT Long Term Goals - 04/24/17 1736      PT LONG TERM GOAL #1   Title  Pt will improve QDASH score to 20% limited or less    Baseline  25%    Time  5    Period  Weeks    Status  New    Target Date  05/29/17      PT LONG TERM GOAL #2   Title  Pt will improve L shoulder AROM to equal with the R UE    Time  5    Period  Weeks    Status  New    Target Date  05/29/17      PT LONG TERM GOAL #3   Title  PT/PTA will perform MMT of the UEs with appropriate goal as indicated    Time  5    Period  Weeks    Status  New    Target Date  05/29/17      PT LONG TERM GOAL #4   Title  Pt will be able to tolerate radiation treatment without increased pain    Time  5    Period  Weeks    Status  New    Target Date  05/29/17      PT LONG TERM GOAL #5   Title  Pt will report no limitations in ADLs due to the UEs    Time  5    Period  Weeks    Status  New    Target Date  05/29/17            Plan - 04/26/17 1413    Clinical Impression Statement  Pt continues to progress ROM very well.  Should be good  for radiation positioning.  Was able to take shirt off normally this morning.  Wants to keep with PT sessions to learn strengthening    Rehab Potential  Excellent  PT Frequency  -- 4x per week x 1 week     PT Duration  -- 5 wks    PT Treatment/Interventions  Therapeutic exercise;Manual techniques;Passive range of motion    PT Next Visit Plan  Lt shoulder end ROM focus for radiation positioning (simulation is Friday)    PT Home Exercise Plan  given post op breast stretches and median nerve glide 3/25       Patient will benefit from skilled therapeutic intervention in order to improve the following deficits and impairments:  Decreased skin integrity, Decreased mobility, Decreased scar mobility, Decreased activity tolerance, Decreased range of motion, Decreased strength  Visit Diagnosis: Aftercare following surgery for neoplasm  Stiffness of left shoulder, not elsewhere classified  Stiffness of right shoulder, not elsewhere classified     Problem List Patient Active Problem List   Diagnosis Date Noted  . Breast cancer, stage 2, left ([place]) 03/27/2017  . Port-A-Cath in place 12/16/2016  . Genetic testing 12/06/2016  . Encounter for antineoplastic chemotherapy 11/25/2016  . Family history of breast cancer   . Family history of prostate cancer   . Malignant neoplasm of lower-outer quadrant of left breast of female, estrogen receptor negative ([place]) 10/27/2016  . Rotator cuff impingement syndrome of left shoulder 07/12/2013    Stark Bray, PT 04/26/2017, 2:58 PM  [place], [place], [address] Phone: [phone]   Fax:  [phone]  Name: Laura Anthony MRN: [id number] Date of Birth: 1975/12/10

## 2017-04-27 ENCOUNTER — Ambulatory Visit: Payer: 59

## 2017-04-27 ENCOUNTER — Other Ambulatory Visit: Payer: 59

## 2017-04-28 ENCOUNTER — Inpatient Hospital Stay: Payer: 59

## 2017-04-28 ENCOUNTER — Ambulatory Visit
Admission: RE | Admit: 2017-04-28 | Discharge: 2017-04-28 | Disposition: A | Discharge status: Home / Self Care | Payer: 59 | Source: Ambulatory Visit | Attending: Radiation Oncology | Admitting: Radiation Oncology

## 2017-04-28 VITALS — BP 113/78 | HR 66 | Temp 98.3°F | Resp 16

## 2017-04-28 DIAGNOSIS — Z171 Estrogen receptor negative status [ER-]: Secondary | ICD-10-CM

## 2017-04-28 DIAGNOSIS — C50512 Malignant neoplasm of lower-outer quadrant of left female breast: Secondary | ICD-10-CM

## 2017-04-28 DIAGNOSIS — Z95828 Presence of other vascular implants and grafts: Secondary | ICD-10-CM

## 2017-04-28 DIAGNOSIS — Z5112 Encounter for antineoplastic immunotherapy: Secondary | ICD-10-CM | POA: Diagnosis not present

## 2017-04-28 LAB — COMPREHENSIVE METABOLIC PANEL
ALT: 12 U/L (ref 0–55)
AST: 15 U/L (ref 5–34)
Albumin: 3.7 g/dL (ref 3.5–5.0)
Alkaline Phosphatase: 81 U/L (ref 40–150)
Anion gap: 7 (ref 3–11)
BUN: 12 mg/dL (ref 7–26)
CO2: 28 mmol/L (ref 22–29)
Calcium: 9.5 mg/dL (ref 8.4–10.4)
Chloride: 105 mmol/L (ref 98–109)
Creatinine, Ser: 0.8 mg/dL (ref 0.60–1.10)
GFR calc Af Amer: >60 mL/min (ref >60)
GFR calc non Af Amer: >60 mL/min (ref >60)
Glucose, Bld: 70 mg/dL (ref 70–140)
Potassium: 3.9 mmol/L (ref 3.5–5.1)
Sodium: 140 mmol/L (ref 136–145)
Total Bilirubin: 0.2 mg/dL (ref 0.2–1.2)
Total Protein: 7 g/dL (ref 6.4–8.3)

## 2017-04-28 LAB — CBC WITH DIFFERENTIAL/PLATELET
Basophils Absolute: 0 10*3/uL (ref 0.0–0.1)
Basophils Relative: 1 %
Eosinophils Absolute: 0.3 10*3/uL (ref 0.0–0.5)
Eosinophils Relative: 5 %
HCT: 31.3 % — ABNORMAL LOW (ref 34.8–46.6)
Hemoglobin: 10.2 g/dL — ABNORMAL LOW (ref 11.6–15.9)
Lymphocytes Relative: 36 %
Lymphs Abs: 1.9 10*3/uL (ref 0.9–3.3)
MCH: 31.3 pg (ref 25.1–34.0)
MCHC: 32.6 g/dL (ref 31.5–36.0)
MCV: 96 fL (ref 79.5–101.0)
Monocytes Absolute: 0.5 10*3/uL (ref 0.1–0.9)
Monocytes Relative: 10 %
Neutro Abs: 2.5 10*3/uL (ref 1.5–6.5)
Neutrophils Relative %: 48 %
Platelets: 197 10*3/uL (ref 145–400)
RBC: 3.26 MIL/uL — ABNORMAL LOW (ref 3.70–5.45)
RDW: 13 % (ref 11.2–14.5)
WBC: 5.2 10*3/uL (ref 3.9–10.3)

## 2017-04-28 MED ORDER — SODIUM CHLORIDE 0.9 % IV SOLN
420.0000 mg | Freq: Once | INTRAVENOUS | Status: AC
  Administered 2017-04-28: 420 mg via INTRAVENOUS
  Filled 2017-04-28: qty 14

## 2017-04-28 MED ORDER — ACETAMINOPHEN 325 MG PO TABS
ORAL_TABLET | ORAL | Status: AC
  Filled 2017-04-28: qty 2

## 2017-04-28 MED ORDER — HEPARIN SOD (PORK) LOCK FLUSH 100 UNIT/ML IV SOLN
500.0000 [IU] | Freq: Once | INTRAVENOUS | Status: AC | PRN
  Administered 2017-04-28: 500 [IU]
  Filled 2017-04-28: qty 5

## 2017-04-28 MED ORDER — DIPHENHYDRAMINE HCL 25 MG PO CAPS
ORAL_CAPSULE | ORAL | Status: AC
  Filled 2017-04-28: qty 1

## 2017-04-28 MED ORDER — DIPHENHYDRAMINE HCL 25 MG PO CAPS
25.0000 mg | ORAL_CAPSULE | Freq: Once | ORAL | Status: AC
  Administered 2017-04-28: 25 mg via ORAL

## 2017-04-28 MED ORDER — SODIUM CHLORIDE 0.9% FLUSH
10.0000 mL | INTRAVENOUS | Status: DC | PRN
  Administered 2017-04-28: 10 mL
  Filled 2017-04-28: qty 10

## 2017-04-28 MED ORDER — SODIUM CHLORIDE 0.9 % IV SOLN
Freq: Once | INTRAVENOUS | Status: AC
  Administered 2017-04-28: 10:00:00 via INTRAVENOUS

## 2017-04-28 MED ORDER — SODIUM CHLORIDE 0.9% FLUSH
10.0000 mL | INTRAVENOUS | Status: DC | PRN
  Administered 2017-04-28: 10 mL via INTRAVENOUS
  Filled 2017-04-28: qty 10

## 2017-04-28 MED ORDER — ACETAMINOPHEN 325 MG PO TABS
650.0000 mg | ORAL_TABLET | Freq: Once | ORAL | Status: AC
  Administered 2017-04-28: 650 mg via ORAL

## 2017-04-28 MED ORDER — TRASTUZUMAB CHEMO 150 MG IV SOLR
420.0000 mg | Freq: Once | INTRAVENOUS | Status: AC
  Administered 2017-04-28: 420 mg via INTRAVENOUS
  Filled 2017-04-28: qty 20

## 2017-04-28 NOTE — Addendum Note (Signed)
Addended by: Neysa Hotter on: 04/28/2017 05:33 PM   Modules accepted: Orders

## 2017-04-28 NOTE — Progress Notes (Signed)
Ok to increase Herceptin dose based on most recent weight per MD.

## 2017-04-28 NOTE — Patient Instructions (Signed)
[facility] Discharge Instructions for Patients Receiving Chemotherapy  Today you received the following chemotherapy agents herceptin/perjeta  To help prevent nausea and vomiting after your treatment, we encourage you to take your nausea medication as directed   If you develop nausea and vomiting that is not controlled by your nausea medication, call the clinic.   BELOW ARE SYMPTOMS THAT SHOULD BE REPORTED IMMEDIATELY:  *FEVER GREATER THAN 100.5 F  *CHILLS WITH OR WITHOUT FEVER  NAUSEA AND VOMITING THAT IS NOT CONTROLLED WITH YOUR NAUSEA MEDICATION  *UNUSUAL SHORTNESS OF BREATH  *UNUSUAL BRUISING OR BLEEDING  TENDERNESS IN MOUTH AND THROAT WITH OR WITHOUT PRESENCE OF ULCERS  *URINARY PROBLEMS  *BOWEL PROBLEMS  UNUSUAL RASH Items with * indicate a potential emergency and should be followed up as soon as possible.  Feel free to call the clinic you have any questions or concerns. The clinic phone number is [phone].  

## 2017-05-01 DIAGNOSIS — Z510 Encounter for antineoplastic radiation therapy: Secondary | ICD-10-CM | POA: Insufficient documentation

## 2017-05-01 DIAGNOSIS — C50512 Malignant neoplasm of lower-outer quadrant of left female breast: Secondary | ICD-10-CM | POA: Insufficient documentation

## 2017-05-01 DIAGNOSIS — Z171 Estrogen receptor negative status [ER-]: Secondary | ICD-10-CM | POA: Insufficient documentation

## 2017-05-02 ENCOUNTER — Ambulatory Visit: Payer: Self-pay | Admitting: Radiation Oncology

## 2017-05-02 NOTE — Progress Notes (Signed)
  Radiation Oncology         [phone]) [phone] ________________________________  Name: Laura Anthony MRN: [id number]  Date: 04/28/2017  DOB: July 14, 1975  Diagnosis DIAGNOSIS:     ICD-10-CM   1. Malignant neoplasm of lower-outer quadrant of left breast of female, estrogen receptor negative ([place]) C50.512    Z17.1      SIMULATION AND TREATMENT PLANNING NOTE  The patient presented for simulation prior to beginning her course of radiation treatment for her diagnosis of left-sided breast cancer. The patient was placed in a supine position on a breast board. A customized vac-lock bag was also constructed and this complex treatment device will be used on a daily basis during her treatment. In this fashion, a CT scan was obtained through the chest area and an isocenter was placed near the chest wall at the upper aspect of the right chest. A breath-hold technique has also been evaluated to determine if this significantly improves the spatial relationship between the target region and the heart. Based on this analysis, a breath-hold technique has been ordered for the patient's treatment.  The patient will be planned to receive a course of radiation initially to a dose of 50.4 gray. This will consist of a 4 field technique targeting the left chest wall as well as the supraclavicular region. Therefore 2 customized medial and lateral tangent fields have been created targeting the chest wall, and also 2 additional customized fields have been designed to treat the supraclavicular region both with a left supraclavicular field and a left posterior axillary boost field. A forward planning/reduced field technique will also be evaluated to determine if this significantly improves the dose homogeneity of the overall plan. Therefore, additional customized blocks/fields may be necessary.  This initial treatment will be accomplished at 1.8 gray per fraction.   The initial plan will consist of a 3-D conformal  technique. The target volume/scar, heart and lungs have been contoured and dose volume histograms of each of these structures will be evaluated as part of the 3-D conformal treatment planning process.   It is anticipated that the patient will then receive a 10 gray boost to the surgical scar. This will be accomplished at 2 gray per fraction. The final anticipated total dose therefore will correspond to 60.4 gray.   Special treatment procedure was performed today due to the extra time and effort required by myself to plan and prepare this patient for deep inspiration breath hold technique.  I have determined cardiac sparing to be of benefit to this patient to prevent long term cardiac damage due to radiation of the heart.  Bellows were placed on the patient's abdomen. To facilitate cardiac sparing, the patient was coached by the radiation therapists on breath hold techniques and breathing practice was performed. Practice waveforms were obtained. The patient was then scanned while maintaining breath hold in the treatment position.  This image was then transferred over to the imaging specialist. The imaging specialist then created a fusion of the free breathing and breath hold scans using the chest wall as the stable structure. I personally reviewed the fusion in axial, coronal and sagittal image planes.  Excellent cardiac sparing was obtained.  I felt the patient is an appropriate candidate for breath hold and the patient will be treated as such.  The image fusion was then reviewed with the patient to reinforce the necessity of reproducible breath hold.     _______________________________   Jodelle Gross, MD, PhD

## 2017-05-02 NOTE — Progress Notes (Signed)
  Radiation Oncology         [phone] ________________________________  Name: Laura Anthony MRN: [id number]  Date: 04/28/2017  DOB: 07-Feb-1975  Optical Surface Tracking Plan:  Since intensity modulated radiotherapy (IMRT) and 3D conformal radiation treatment methods are predicated on accurate and precise positioning for treatment, intrafraction motion monitoring is medically necessary to ensure accurate and safe treatment delivery.  The ability to quantify intrafraction motion without excessive ionizing radiation dose can only be performed with optical surface tracking. Accordingly, surface imaging offers the opportunity to obtain 3D measurements of patient position throughout IMRT and 3D treatments without excessive radiation exposure.  I am ordering optical surface tracking for this patient's upcoming course of radiotherapy. ________________________________  Kyung Rudd, MD 05/02/2017 2:51 PM    Reference:   Ursula Alert, J, et al. Surface imaging-based analysis of intrafraction motion for breast radiotherapy patients.Journal of [place], n. 6, nov. 2014. ISSN [id number].   Available at: <http://www.jacmp.org/index.php/jacmp/article/view/4957>.

## 2017-05-04 DIAGNOSIS — Z510 Encounter for antineoplastic radiation therapy: Secondary | ICD-10-CM | POA: Diagnosis not present

## 2017-05-04 DIAGNOSIS — Z171 Estrogen receptor negative status [ER-]: Secondary | ICD-10-CM | POA: Diagnosis not present

## 2017-05-04 DIAGNOSIS — C50512 Malignant neoplasm of lower-outer quadrant of left female breast: Secondary | ICD-10-CM | POA: Diagnosis not present

## 2017-05-05 ENCOUNTER — Encounter: Payer: Self-pay | Admitting: Physical Therapy

## 2017-05-05 ENCOUNTER — Ambulatory Visit
Admission: RE | Admit: 2017-05-05 | Discharge: 2017-05-05 | Disposition: A | Discharge status: Home / Self Care | Payer: 59 | Source: Ambulatory Visit | Attending: Radiation Oncology | Admitting: Radiation Oncology

## 2017-05-05 ENCOUNTER — Ambulatory Visit: Payer: 59 | Attending: Plastic Surgery | Admitting: Physical Therapy

## 2017-05-05 DIAGNOSIS — M25612 Stiffness of left shoulder, not elsewhere classified: Secondary | ICD-10-CM | POA: Diagnosis present

## 2017-05-05 DIAGNOSIS — Z483 Aftercare following surgery for neoplasm: Secondary | ICD-10-CM | POA: Diagnosis not present

## 2017-05-05 DIAGNOSIS — M25611 Stiffness of right shoulder, not elsewhere classified: Secondary | ICD-10-CM | POA: Insufficient documentation

## 2017-05-05 DIAGNOSIS — M6281 Muscle weakness (generalized): Secondary | ICD-10-CM | POA: Diagnosis present

## 2017-05-05 DIAGNOSIS — C50512 Malignant neoplasm of lower-outer quadrant of left female breast: Secondary | ICD-10-CM | POA: Diagnosis not present

## 2017-05-05 NOTE — Therapy (Signed)
[place], [place], [address] Phone: [phone]   Fax:  [phone]  Physical Therapy Treatment  Patient Details  Name: Laura Anthony MRN: [id number] Date of Birth: Oct 01, 1975 Referring Provider: Irene Limbo, MD   Encounter Date: 05/05/2017  PT End of Session - 05/05/17 1028    Visit Number  4    Number of Visits  12    Date for PT Re-Evaluation  05/29/17    Authorization Type  UHC    PT Start Time  0937    PT Stop Time  1025    PT Time Calculation (min)  48 min    Activity Tolerance  Patient tolerated treatment well    Behavior During Therapy  [facility] for tasks assessed/performed       Past Medical History:  Diagnosis Date  . ADD (attention deficit disorder)   . Anemia   . Anxiety   . 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 Breast cancer, left breast ([place])    S/P mastectomy 03/27/2017  . DVT (deep venous thrombosis) ([place]) 2017   calf left - probably due to [facility] pills-took eliquis x3 mos, nonthing now  . Family history of breast cancer   . Family history of prostate cancer   . High cholesterol   . Impingement syndrome of right shoulder 07/2013  . Migraine    "usually 1/month" (03/28/2017)  . PONV (postoperative nausea and vomiting)   . Right bicipital tenosynovitis 07/2013  . Rotator cuff impingement syndrome of right shoulder 07/12/2013  . Seizures ([place])    x 1 as a child - was never on anticonvulsants (03/28/2017)    Past Surgical History:  Procedure Laterality Date  . ADENOIDECTOMY  1981  . ANKLE ARTHROSCOPY Right   . BREAST BIOPSY Left 10/2016  . KNEE ARTHROSCOPY Right   . KNEE ARTHROSCOPY W/ ACL RECONSTRUCTION Left   . MASTECTOMY Left 03/27/2017   NIPPLE SPARING MASTECTOMY WITH RADIOACTIVE SEED TARGETED LYMPH NODE EXCISION AND LEFT AXILLARY SENTINEL LYMPH NODE BIOPSY  . MASTECTOMY Right 03/27/2017   RIGHT PROPHYLACTIC NIPPLE SPARING  MASTECTOMY  . NIPPLE SPARING MASTECTOMY Right 03/27/2017   Procedure: RIGHT PROPHYLACTIC NIPPLE SPARING MASTECTOMY;  Surgeon: Rolm Bookbinder, MD;  Location: [place];  Service: General;  Laterality: Right;  . PORTACATH PLACEMENT N/A 11/01/2016   Procedure: INSERTION PORT-A-CATH WITH [place];  Surgeon: Rolm Bookbinder, MD;  Location: [place];  Service: General;  Laterality: N/A;  . RADIOACTIVE SEED GUIDED AXILLARY SENTINEL LYMPH NODE Left 03/27/2017   Procedure: LEFT NIPPLE SPARING MASTECTOMY WITH RADIOACTIVE SEED TARGETED LYMPH NODE EXCISION AND LEFT AXILLARY SENTINEL LYMPH NODE BIOPSY;  Surgeon: Rolm Bookbinder, MD;  Location: [place];  Service: General;  Laterality: Left;  REQUESTS RNFA  . RECONSTRUCTION BREAST IMMEDIATE / DELAYED W/ TISSUE EXPANDER Bilateral 03/27/2017   BILATERAL BREAST RECONSTRUCTION WITH PLACEMENT OF TISSUE EXPANDER AND ALLODERM  . SEPTOPLASTY WITH ETHMOIDECTOMY, AND MAXILLARY ANTROSTOMY  10/29/2010   bilat. max. antrostomy with left max. stripping; left ant. ethmoidectomy; right total ethmoidectomy; sphenoidotomy  . SHOULDER ARTHROSCOPY WITH SUBACROMIAL DECOMPRESSION AND BICEP TENDON REPAIR Right 07/12/2013   Procedure: RIGHT SHOULDER ARTHROSCOPY DEBRIDEMENT EXTENSIVE DECOMPRESSION SUBACROMIAL PARTIAL ACROMIOPLASTY;  Surgeon: Johnny Bridge, MD;  Location: [place];  Service: Orthopedics;  Laterality: Right;  . WRIST ARTHROSCOPY  01/17/2012   Procedure: ARTHROSCOPY WRIST; right wrist Surgeon: Tennis Must, MD;  Location: [place];  Service: Orthopedics;  Laterality: Right;  RIGHT WRIST ARTHROSCOPY WITH TRIANGULAR FIBROCARTILAGE COMPLEX REPAIR AND DEBRIDEMENT     There were no vitals filed for this visit.  Subjective Assessment - 05/05/17 0939    Subjective  I have been having a lot of pain under my breast where my expanders are. I wonder if it is where they are sewn in. I start radiation on Monday.      Pertinent History  R RC surgery, bil mastectomy, neoadjuvant chemotherapy currently getting herceptin, starting radiation next week, currently has breast expanders bilaterally    Patient Stated Goals  improve shoulder ROM, get back to normal active status    Currently in Pain?  Yes    Pain Score  5     Pain Location  Breast    Pain Orientation  Lower    Pain Descriptors / Indicators  Sharp;Aching    Pain Type  Acute pain    Pain Onset  In the past 7 days    Pain Frequency  Intermittent    Aggravating Factors   when she is using her UEs- brushing horse or cleaning    Pain Relieving Factors  doing nothing    Effect of Pain on Daily Activities  hard to groom horse and clean house         [facility] PT Assessment - 05/05/17 0001      AROM   Right Shoulder Flexion  162 Degrees    Right Shoulder ABduction  158 Degrees    Left Shoulder Flexion  152 Degrees    Left Shoulder ABduction  155 Degrees                   [facility] Adult PT Treatment/Exercise - 05/05/17 0001      Shoulder Exercises: Pulleys   Flexion  2 minutes    ABduction  2 minutes      Shoulder Exercises: Therapy Ball   Flexion  Both;10 reps with lift off    ABduction  Both;5 reps      Manual Therapy   Manual Therapy  Soft tissue mobilization;Myofascial release;Passive ROM    Soft tissue mobilization  along bilateral mastectomy scars    Myofascial Release  cross hands to L and right axilla, also from upper arm to area inferior to breast bilaterally to help decrease pain in inferior breast    Passive ROM  In Supine to Lt and right shoulder into flexion, abduction, er to tolerance                  PT Long Term Goals - 05/05/17 0951      PT LONG TERM GOAL #1   Title  Pt will improve QDASH score to 20% limited or less    Baseline  25%    Time  5    Period  Weeks    Status  On-going      PT LONG TERM GOAL #2   Title  Pt will improve L shoulder AROM to equal with the R UE    Baseline  05/05/17- R 162  flex, L 152 flex, R 158 abd, L 155 abd    Time  5    Period  Weeks    Status  On-going      PT LONG TERM GOAL #3   Title  PT/PTA will perform MMT of the UEs with appropriate goal as indicated    Time  5    Period  Weeks    Status  On-going  PT LONG TERM GOAL #4   Title  Pt will be able to tolerate radiation treatment without increased pain    Time  5    Period  Weeks    Status  On-going      PT LONG TERM GOAL #5   Title  Pt will report no limitations in ADLs due to the UEs    Time  5    Period  Weeks    Status  On-going            Plan - 05/05/17 1028    Clinical Impression Statement  Pt has been having pain in inferior breasts along scar line. Performed myofascial release and soft tissue mobilization along scar line. Pt is progressing towards goals and her bilateral shoulder ROM is improving. Pt can tell improvement in her AROM.     Rehab Potential  Excellent    PT Frequency  -- 4x/wk per 1 wk    PT Duration  -- 5 wks    PT Treatment/Interventions  Therapeutic exercise;Manual techniques;Passive range of motion    PT Next Visit Plan  instruct in supine scap series, assess inferior breast pain after last session, Lt shoulder end ROM focus for radiation positioning (simulation is Friday)    PT Home Exercise Plan  given post op breast stretches and median nerve glide 3/25    Consulted and Agree with Plan of Care  Patient       Patient will benefit from skilled therapeutic intervention in order to improve the following deficits and impairments:  Decreased skin integrity, Decreased mobility, Decreased scar mobility, Decreased activity tolerance, Decreased range of motion, Decreased strength  Visit Diagnosis: Aftercare following surgery for neoplasm  Stiffness of left shoulder, not elsewhere classified  Stiffness of right shoulder, not elsewhere classified     Problem List Patient Active Problem List   Diagnosis Date Noted  . Breast cancer, stage 2, left ([place])  03/27/2017  . Port-A-Cath in place 12/16/2016  . Genetic testing 12/06/2016  . Encounter for antineoplastic chemotherapy 11/25/2016  . Family history of breast cancer   . Family history of prostate cancer   . Malignant neoplasm of lower-outer quadrant of left breast of female, estrogen receptor negative ([place]) 10/27/2016  . Rotator cuff impingement syndrome of left shoulder 07/12/2013    Allyson Sabal [facility] 05/05/2017, 10:32 AM  [place], [place], [address] Phone: [phone]   Fax:  [phone]  Name: Laura Anthony MRN: [id number] Date of Birth: May 22, 1975  Manus Gunning, PT 05/05/17 10:32 AM

## 2017-05-08 ENCOUNTER — Ambulatory Visit
Admission: RE | Admit: 2017-05-08 | Discharge: 2017-05-08 | Disposition: A | Discharge status: Home / Self Care | Payer: 59 | Source: Ambulatory Visit | Attending: Radiation Oncology | Admitting: Radiation Oncology

## 2017-05-08 DIAGNOSIS — Z483 Aftercare following surgery for neoplasm: Secondary | ICD-10-CM | POA: Diagnosis not present

## 2017-05-08 DIAGNOSIS — C50512 Malignant neoplasm of lower-outer quadrant of left female breast: Secondary | ICD-10-CM | POA: Diagnosis not present

## 2017-05-09 ENCOUNTER — Ambulatory Visit
Admission: RE | Admit: 2017-05-09 | Discharge: 2017-05-09 | Disposition: A | Discharge status: Home / Self Care | Payer: 59 | Source: Ambulatory Visit | Attending: Radiation Oncology | Admitting: Radiation Oncology

## 2017-05-09 ENCOUNTER — Ambulatory Visit: Payer: 59

## 2017-05-09 DIAGNOSIS — M25611 Stiffness of right shoulder, not elsewhere classified: Secondary | ICD-10-CM

## 2017-05-09 DIAGNOSIS — M25612 Stiffness of left shoulder, not elsewhere classified: Secondary | ICD-10-CM

## 2017-05-09 DIAGNOSIS — Z483 Aftercare following surgery for neoplasm: Secondary | ICD-10-CM

## 2017-05-09 DIAGNOSIS — C50512 Malignant neoplasm of lower-outer quadrant of left female breast: Secondary | ICD-10-CM | POA: Diagnosis not present

## 2017-05-09 DIAGNOSIS — M6281 Muscle weakness (generalized): Secondary | ICD-10-CM

## 2017-05-09 NOTE — Therapy (Signed)
[place], [place], [address] Phone: [phone]   Fax:  [phone]  Physical Therapy Treatment  Patient Details  Name: Laura Anthony MRN: [id number] Date of Birth: September 01, 1975 Referring Provider: Irene Limbo, MD   Encounter Date: 05/09/2017  PT End of Session - 05/09/17 0840    Visit Number  5    Number of Visits  12    Date for PT Re-Evaluation  05/29/17    Authorization Type  [employer]    PT Start Time  [phone] Pt arrived late and reported needing to leave at 0840 for other appts    PT Stop Time  0840    PT Time Calculation (min)  28 min    Activity Tolerance  Patient tolerated treatment well    Behavior During Therapy  [facility] for tasks assessed/performed       Past Medical History:  Diagnosis Date  . ADD (attention deficit disorder)   . Anemia   . Anxiety   . 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 Breast cancer, left breast ([place])    S/P mastectomy 03/27/2017  . DVT (deep venous thrombosis) ([place]) 2017   calf left - probably due to [facility] pills-took eliquis x3 mos, nonthing now  . Family history of breast cancer   . Family history of prostate cancer   . High cholesterol   . Impingement syndrome of right shoulder 07/2013  . Migraine    "usually 1/month" (03/28/2017)  . PONV (postoperative nausea and vomiting)   . Right bicipital tenosynovitis 07/2013  . Rotator cuff impingement syndrome of right shoulder 07/12/2013  . Seizures ([place])    x 1 as a child - was never on anticonvulsants (03/28/2017)    Past Surgical History:  Procedure Laterality Date  . ADENOIDECTOMY  1981  . ANKLE ARTHROSCOPY Right   . BREAST BIOPSY Left 10/2016  . KNEE ARTHROSCOPY Right   . KNEE ARTHROSCOPY W/ ACL RECONSTRUCTION Left   . MASTECTOMY Left 03/27/2017   NIPPLE SPARING MASTECTOMY WITH RADIOACTIVE SEED TARGETED LYMPH NODE EXCISION AND LEFT AXILLARY SENTINEL LYMPH NODE BIOPSY  .  MASTECTOMY Right 03/27/2017   RIGHT PROPHYLACTIC NIPPLE SPARING MASTECTOMY  . NIPPLE SPARING MASTECTOMY Right 03/27/2017   Procedure: RIGHT PROPHYLACTIC NIPPLE SPARING MASTECTOMY;  Surgeon: Rolm Bookbinder, MD;  Location: [place];  Service: General;  Laterality: Right;  . PORTACATH PLACEMENT N/A 11/01/2016   Procedure: INSERTION PORT-A-CATH WITH [place];  Surgeon: Rolm Bookbinder, MD;  Location: [place];  Service: General;  Laterality: N/A;  . RADIOACTIVE SEED GUIDED AXILLARY SENTINEL LYMPH NODE Left 03/27/2017   Procedure: LEFT NIPPLE SPARING MASTECTOMY WITH RADIOACTIVE SEED TARGETED LYMPH NODE EXCISION AND LEFT AXILLARY SENTINEL LYMPH NODE BIOPSY;  Surgeon: Rolm Bookbinder, MD;  Location: [place];  Service: General;  Laterality: Left;  REQUESTS RNFA  . RECONSTRUCTION BREAST IMMEDIATE / DELAYED W/ TISSUE EXPANDER Bilateral 03/27/2017   BILATERAL BREAST RECONSTRUCTION WITH PLACEMENT OF TISSUE EXPANDER AND ALLODERM  . SEPTOPLASTY WITH ETHMOIDECTOMY, AND MAXILLARY ANTROSTOMY  10/29/2010   bilat. max. antrostomy with left max. stripping; left ant. ethmoidectomy; right total ethmoidectomy; sphenoidotomy  . SHOULDER ARTHROSCOPY WITH SUBACROMIAL DECOMPRESSION AND BICEP TENDON REPAIR Right 07/12/2013   Procedure: RIGHT SHOULDER ARTHROSCOPY DEBRIDEMENT EXTENSIVE DECOMPRESSION SUBACROMIAL PARTIAL ACROMIOPLASTY;  Surgeon: Johnny Bridge, MD;  Location: [place];  Service: Orthopedics;  Laterality: Right;  . WRIST ARTHROSCOPY  01/17/2012   Procedure: ARTHROSCOPY WRIST; right wrist Surgeon:  Tennis Must, MD;  Location: [place];  Service: Orthopedics;  Laterality: Right;  RIGHT WRIST ARTHROSCOPY WITH TRIANGULAR FIBROCARTILAGE COMPLEX REPAIR AND DEBRIDEMENT     There were no vitals filed for this visit.  Subjective Assessment - 05/09/17 0814    Subjective  My inferior breast pain was much better after last visit. I'm sorry I was running  late and I need to leave a few mins early. I just have alot of appts today between me and my kids.    Pertinent History  R RC surgery, bil mastectomy, neoadjuvant chemotherapy currently getting herceptin, starting radiation next week, currently has breast expanders bilaterally    Patient Stated Goals  improve shoulder ROM, get back to normal active status    Currently in Pain?  No/denies                       [facility] Adult PT Treatment/Exercise - 05/09/17 0001      Shoulder Exercises: Supine   Horizontal ABduction  Strengthening;Both;5 reps;Theraband    Theraband Level (Shoulder Horizontal ABduction)  Level 1 (Yellow)    External Rotation  Strengthening;Both;5 reps;Theraband    Theraband Level (Shoulder External Rotation)  Level 1 (Yellow)    Flexion  Strengthening;Both;5 reps;Theraband Narrow and Wide Grip, 5 times each    Theraband Level (Shoulder Flexion)  Level 1 (Yellow)    Other Supine Exercises  Bil D2 with yellow theraband 5 times each side; pt returning therapist demonstration for all above exs.      Shoulder Exercises: Pulleys   Flexion  1 minute    ABduction  1 minute    ABduction Limitations  Decreased time done today due to shortened session per pt      Manual Therapy   Manual Therapy  Soft tissue mobilization;Myofascial release;Passive ROM    Myofascial Release  cross hands to Lt and right axilla, also from upper arm to area inferior to breast bilaterally to help decrease pain in inferior breast    Passive ROM  In Supine to Lt and right shoulder into flexion, abduction, D2 to tolerance             PT Education - 05/09/17 0818    Education provided  Yes    Education Details  Supine scapular series with yellow theraband    Person(s) Educated  Patient    Methods  Explanation;Demonstration;Tactile cues;Handout    Comprehension  Verbalized understanding;Returned demonstration          PT Long Term Goals - 05/05/17 0951      PT LONG TERM GOAL #1    Title  Pt will improve QDASH score to 20% limited or less    Baseline  25%    Time  5    Period  Weeks    Status  On-going      PT LONG TERM GOAL #2   Title  Pt will improve L shoulder AROM to equal with the R UE    Baseline  05/05/17- R 162 flex, L 152 flex, R 158 abd, L 155 abd    Time  5    Period  Weeks    Status  On-going      PT LONG TERM GOAL #3   Title  PT/PTA will perform MMT of the UEs with appropriate goal as indicated    Time  5    Period  Weeks    Status  On-going      PT  LONG TERM GOAL #4   Title  Pt will be able to tolerate radiation treatment without increased pain    Time  5    Period  Weeks    Status  On-going      PT LONG TERM GOAL #5   Title  Pt will report no limitations in ADLs due to the UEs    Time  5    Period  Weeks    Status  On-going            Plan - 05/09/17 0841    Clinical Impression Statement  Pt was feeling much better in inferior breasts today ever since last session. She reports no discomfort here today, just still limited with end ROM and chest tightness. Progressed HEP today to include supine scapular series which she tolerated very well. Then continued with focus to bil shoulder end ROM and manual therapy for myofascial release to bil axilla. Decreased tightness reported in bil axillae at end of session.    Rehab Potential  Excellent    PT Frequency  2x / week    PT Duration  -- 5 weeks    PT Treatment/Interventions  Therapeutic exercise;Manual techniques;Passive range of motion    PT Next Visit Plan  Review supine scap series, cont Lt shoulder end ROM focus for radiation positioning (began this 05/08/17)    Consulted and Agree with Plan of Care  Patient       Patient will benefit from skilled therapeutic intervention in order to improve the following deficits and impairments:  Decreased skin integrity, Decreased mobility, Decreased scar mobility, Decreased activity tolerance, Decreased range of motion, Decreased strength  Visit  Diagnosis: Aftercare following surgery for neoplasm  Stiffness of left shoulder, not elsewhere classified  Stiffness of right shoulder, not elsewhere classified  Muscle weakness (generalized)     Problem List Patient Active Problem List   Diagnosis Date Noted  . Breast cancer, stage 2, left ([place]) 03/27/2017  . Port-A-Cath in place 12/16/2016  . Genetic testing 12/06/2016  . Encounter for antineoplastic chemotherapy 11/25/2016  . Family history of breast cancer   . Family history of prostate cancer   . Malignant neoplasm of lower-outer quadrant of left breast of female, estrogen receptor negative ([place]) 10/27/2016  . Rotator cuff impingement syndrome of left shoulder 07/12/2013    Otelia Limes, PTA 05/09/2017, 8:46 AM  [address], [place], [address] Phone: [phone]   Fax:  [phone]  Name: Boluwatife Mutchler MRN: [id number] Date of Birth: 02-Mar-1975

## 2017-05-09 NOTE — Patient Instructions (Signed)

## 2017-05-10 ENCOUNTER — Ambulatory Visit
Admission: RE | Admit: 2017-05-10 | Discharge: 2017-05-10 | Disposition: A | Discharge status: Home / Self Care | Payer: 59 | Source: Ambulatory Visit | Attending: Radiation Oncology | Admitting: Radiation Oncology

## 2017-05-10 DIAGNOSIS — C50512 Malignant neoplasm of lower-outer quadrant of left female breast: Secondary | ICD-10-CM | POA: Diagnosis not present

## 2017-05-11 ENCOUNTER — Ambulatory Visit
Admission: RE | Admit: 2017-05-11 | Discharge: 2017-05-11 | Disposition: A | Discharge status: Home / Self Care | Payer: 59 | Source: Ambulatory Visit | Attending: Radiation Oncology | Admitting: Radiation Oncology

## 2017-05-11 ENCOUNTER — Ambulatory Visit: Payer: 59

## 2017-05-11 ENCOUNTER — Encounter: Payer: Self-pay | Admitting: Radiation Oncology

## 2017-05-11 DIAGNOSIS — C50512 Malignant neoplasm of lower-outer quadrant of left female breast: Secondary | ICD-10-CM | POA: Diagnosis not present

## 2017-05-11 DIAGNOSIS — M25612 Stiffness of left shoulder, not elsewhere classified: Secondary | ICD-10-CM

## 2017-05-11 DIAGNOSIS — M25611 Stiffness of right shoulder, not elsewhere classified: Secondary | ICD-10-CM

## 2017-05-11 DIAGNOSIS — M6281 Muscle weakness (generalized): Secondary | ICD-10-CM

## 2017-05-11 DIAGNOSIS — Z483 Aftercare following surgery for neoplasm: Secondary | ICD-10-CM

## 2017-05-11 NOTE — Therapy (Addendum)
[place], [place], [address] Phone: [phone]   Fax:  [phone]  Physical Therapy Treatment  Patient Details  Name: Laura Anthony MRN: [id number] Date of Birth: 07-22-1975 Referring Provider: Irene Limbo, MD   Encounter Date: 05/11/2017  PT End of Session - 05/11/17 0941    Visit Number  6    Number of Visits  12    Date for PT Re-Evaluation  05/29/17    PT Start Time  0932    PT Stop Time  1015    PT Time Calculation (min)  43 min    Activity Tolerance  Patient tolerated treatment well    Behavior During Therapy  [facility] for tasks assessed/performed       Past Medical History:  Diagnosis Date  . ADD (attention deficit disorder)   . Anemia   . Anxiety   . 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 Breast cancer, left breast ([place])    S/P mastectomy 03/27/2017  . DVT (deep venous thrombosis) ([place]) 2017   calf left - probably due to [facility] pills-took eliquis x3 mos, nonthing now  . Family history of breast cancer   . Family history of prostate cancer   . High cholesterol   . Impingement syndrome of right shoulder 07/2013  . Migraine    "usually 1/month" (03/28/2017)  . PONV (postoperative nausea and vomiting)   . Right bicipital tenosynovitis 07/2013  . Rotator cuff impingement syndrome of right shoulder 07/12/2013  . Seizures ([place])    x 1 as a child - was never on anticonvulsants (03/28/2017)    Past Surgical History:  Procedure Laterality Date  . ADENOIDECTOMY  1981  . ANKLE ARTHROSCOPY Right   . BREAST BIOPSY Left 10/2016  . KNEE ARTHROSCOPY Right   . KNEE ARTHROSCOPY W/ ACL RECONSTRUCTION Left   . MASTECTOMY Left 03/27/2017   NIPPLE SPARING MASTECTOMY WITH RADIOACTIVE SEED TARGETED LYMPH NODE EXCISION AND LEFT AXILLARY SENTINEL LYMPH NODE BIOPSY  . MASTECTOMY Right 03/27/2017   RIGHT PROPHYLACTIC NIPPLE SPARING MASTECTOMY  . NIPPLE SPARING  MASTECTOMY Right 03/27/2017   Procedure: RIGHT PROPHYLACTIC NIPPLE SPARING MASTECTOMY;  Surgeon: Rolm Bookbinder, MD;  Location: [place];  Service: General;  Laterality: Right;  . PORTACATH PLACEMENT N/A 11/01/2016   Procedure: INSERTION PORT-A-CATH WITH [place];  Surgeon: Rolm Bookbinder, MD;  Location: [place];  Service: General;  Laterality: N/A;  . RADIOACTIVE SEED GUIDED AXILLARY SENTINEL LYMPH NODE Left 03/27/2017   Procedure: LEFT NIPPLE SPARING MASTECTOMY WITH RADIOACTIVE SEED TARGETED LYMPH NODE EXCISION AND LEFT AXILLARY SENTINEL LYMPH NODE BIOPSY;  Surgeon: Rolm Bookbinder, MD;  Location: [place];  Service: General;  Laterality: Left;  REQUESTS RNFA  . RECONSTRUCTION BREAST IMMEDIATE / DELAYED W/ TISSUE EXPANDER Bilateral 03/27/2017   BILATERAL BREAST RECONSTRUCTION WITH PLACEMENT OF TISSUE EXPANDER AND ALLODERM  . SEPTOPLASTY WITH ETHMOIDECTOMY, AND MAXILLARY ANTROSTOMY  10/29/2010   bilat. max. antrostomy with left max. stripping; left ant. ethmoidectomy; right total ethmoidectomy; sphenoidotomy  . SHOULDER ARTHROSCOPY WITH SUBACROMIAL DECOMPRESSION AND BICEP TENDON REPAIR Right 07/12/2013   Procedure: RIGHT SHOULDER ARTHROSCOPY DEBRIDEMENT EXTENSIVE DECOMPRESSION SUBACROMIAL PARTIAL ACROMIOPLASTY;  Surgeon: Johnny Bridge, MD;  Location: [place];  Service: Orthopedics;  Laterality: Right;  . WRIST ARTHROSCOPY  01/17/2012   Procedure: ARTHROSCOPY WRIST; right wrist Surgeon: Tennis Must, MD;  Location: [place];  Service: Orthopedics;  Laterality: Right;  RIGHT WRIST ARTHROSCOPY  WITH TRIANGULAR FIBROCARTILAGE COMPLEX REPAIR AND DEBRIDEMENT     There were no vitals filed for this visit.  Subjective Assessment - 05/11/17 0935    Subjective  My tightness continues to feel greatly improved since continuing with therapy. I am definitely starting to feel the fatigue from radiation though, and some soreness at my Lt  chest/breast area.     Pertinent History  R RC surgery, Lt breast CA, bil mastectomy, neoadjuvant chemotherapy currently getting herceptin, started radiation 05/08/17, currently has breast expanders bilaterally    Patient Stated Goals  improve shoulder ROM, get back to normal active status    Currently in Pain?  No/denies         [facility] PT Assessment - 05/11/17 0001      AROM   Right Shoulder Flexion  166 Degrees    Right Shoulder ABduction  162 Degrees    Right Shoulder Horizontal ABduction  42 Degrees    Left Shoulder Flexion  156 Degrees    Left Shoulder ABduction  169 Degrees    Left Shoulder Horizontal ABduction  28 Degrees                   [facility] Adult PT Treatment/Exercise - 05/11/17 0001      Shoulder Exercises: Pulleys   Flexion  2 minutes    ABduction  2 minutes    ABduction Limitations  Pt reports feeling good end ROM stretch      Shoulder Exercises: Therapy Ball   Flexion  Both;10 reps 1 lb each wrist; forward lean into end of stretch    ABduction  Both;5 reps 1 lb each wrist; same side lean into end of stretch      Shoulder Exercises: Stretch   Corner Stretch  3 reps;20 seconds In doorway      Manual Therapy   Manual Therapy  Soft tissue mobilization;Myofascial release;Passive ROM    Myofascial Release  cross hands to Lt and right axilla, also from upper arm to area inferior to breast bilaterally to help decrease pain in inferior breast    Passive ROM  In Supine to Lt and right shoulder into flexion, abduction, D2 to tolerance                  PT Long Term Goals - 05/05/17 0951      PT LONG TERM GOAL #1   Title  Pt will improve QDASH score to 20% limited or less    Baseline  25%    Time  5    Period  Weeks    Status  On-going      PT LONG TERM GOAL #2   Title  Pt will improve L shoulder AROM to equal with the R UE    Baseline  05/05/17- R 162 flex, L 152 flex, R 158 abd, L 155 abd    Time  5    Period  Weeks    Status  On-going       PT LONG TERM GOAL #3   Title  PT/PTA will perform MMT of the UEs with appropriate goal as indicated    Time  5    Period  Weeks    Status  On-going      PT LONG TERM GOAL #4   Title  Pt will be able to tolerate radiation treatment without increased pain    Time  5    Period  Weeks    Status  On-going  PT LONG TERM GOAL #5   Title  Pt will report no limitations in ADLs due to the UEs    Time  5    Period  Weeks    Status  On-going            Plan - 05/11/17 0941    Clinical Impression Statement  Pt is showing good progress overall reporting feelng much less tightness since starting therapy and her A/ROM has improved with all measurements today as well. Progressed pt to include pectoralis and neural tension stretches today which she tolerated well. Also issued red theraband for pt to progress herself with supine scapular series as she reports has been doing this but has gotten easier.     Rehab Potential  Excellent    PT Frequency  2x / week    PT Duration  -- 5 weeks    PT Treatment/Interventions  Therapeutic exercise;Manual techniques;Passive range of motion    PT Next Visit Plan  Cont Lt shoulder end ROM focus for radiation positioning (began this 05/08/17); consider adding 3 way raises next    Consulted and Agree with Plan of Care  Patient       Patient will benefit from skilled therapeutic intervention in order to improve the following deficits and impairments:  Decreased skin integrity, Decreased mobility, Decreased scar mobility, Decreased activity tolerance, Decreased range of motion, Decreased strength  Visit Diagnosis: Aftercare following surgery for neoplasm  Stiffness of left shoulder, not elsewhere classified  Stiffness of right shoulder, not elsewhere classified  Muscle weakness (generalized)     Problem List Patient Active Problem List   Diagnosis Date Noted  . Breast cancer, stage 2, left ([place]) 03/27/2017  . Port-A-Cath in place 12/16/2016  .  Genetic testing 12/06/2016  . Encounter for antineoplastic chemotherapy 11/25/2016  . Family history of breast cancer   . Family history of prostate cancer   . Malignant neoplasm of lower-outer quadrant of left breast of female, estrogen receptor negative ([place]) 10/27/2016  . Rotator cuff impingement syndrome of left shoulder 07/12/2013    Otelia Limes, PTA 05/11/2017, 10:16 AM  [place], [place], [address] Phone: [phone]   Fax:  [phone]  Name: Laura Anthony MRN: [id number] Date of Birth: Mar 25, 1975  PHYSICAL THERAPY DISCHARGE SUMMARY  Visits from Start of Care:6  Current functional level related to goals / functional outcomes: See above   Remaining deficits: Need for continued mobility and exercise throughout radiation   Education / Equipment: HEP Plan: Patient agrees to discharge.  Patient goals were met. Patient is being discharged due to meeting the stated rehab goals.  ?????    Shan Levans, PT

## 2017-05-12 ENCOUNTER — Ambulatory Visit
Admission: RE | Admit: 2017-05-12 | Discharge: 2017-05-12 | Disposition: A | Discharge status: Home / Self Care | Payer: 59 | Source: Ambulatory Visit | Attending: Radiation Oncology | Admitting: Radiation Oncology

## 2017-05-12 DIAGNOSIS — C50512 Malignant neoplasm of lower-outer quadrant of left female breast: Secondary | ICD-10-CM

## 2017-05-12 DIAGNOSIS — Z171 Estrogen receptor negative status [ER-]: Secondary | ICD-10-CM

## 2017-05-12 MED ORDER — ALRA NON-METALLIC DEODORANT (RAD-ONC)
1.0000 "application " | Freq: Once | TOPICAL | Status: AC
  Administered 2017-05-12: 1 via TOPICAL

## 2017-05-12 MED ORDER — RADIAPLEXRX EX GEL
Freq: Once | CUTANEOUS | Status: AC
  Administered 2017-05-12: 14:00:00 via TOPICAL

## 2017-05-12 NOTE — Progress Notes (Signed)
Pt here for patient teaching.  Pt given Radiation and You booklet, skin care instructions, Alra deodorant and Radiaplex gel.  Reviewed areas of pertinence such as fatigue, hair loss, skin changes, breast tenderness and breast swelling . Pt able to give teach back of to pat skin and use unscented/gentle soap,apply Radiaplex bid, avoid applying anything to skin within 4 hours of treatment, avoid wearing an under wire bra and to use an electric razor if they must shave. Pt verbalizes understanding of information given and will contact nursing with any questions or concerns.     Cori Razor, RN

## 2017-05-15 ENCOUNTER — Ambulatory Visit
Admission: RE | Admit: 2017-05-15 | Discharge: 2017-05-15 | Disposition: A | Discharge status: Home / Self Care | Payer: 59 | Source: Ambulatory Visit | Attending: Radiation Oncology | Admitting: Radiation Oncology

## 2017-05-15 DIAGNOSIS — C50512 Malignant neoplasm of lower-outer quadrant of left female breast: Secondary | ICD-10-CM | POA: Diagnosis not present

## 2017-05-16 ENCOUNTER — Ambulatory Visit
Admission: RE | Admit: 2017-05-16 | Discharge: 2017-05-16 | Disposition: A | Discharge status: Home / Self Care | Payer: 59 | Source: Ambulatory Visit | Attending: Radiation Oncology | Admitting: Radiation Oncology

## 2017-05-16 DIAGNOSIS — C50512 Malignant neoplasm of lower-outer quadrant of left female breast: Secondary | ICD-10-CM | POA: Diagnosis not present

## 2017-05-17 ENCOUNTER — Encounter: Payer: Self-pay | Admitting: Hematology and Oncology

## 2017-05-17 ENCOUNTER — Emergency Department (HOSPITAL_COMMUNITY): Payer: 59

## 2017-05-17 ENCOUNTER — Other Ambulatory Visit: Payer: Self-pay

## 2017-05-17 ENCOUNTER — Encounter (HOSPITAL_COMMUNITY): Payer: Self-pay | Admitting: Nurse Practitioner

## 2017-05-17 ENCOUNTER — Ambulatory Visit
Admission: RE | Admit: 2017-05-17 | Discharge: 2017-05-17 | Disposition: A | Discharge status: Home / Self Care | Payer: 59 | Source: Ambulatory Visit | Attending: Radiation Oncology | Admitting: Radiation Oncology

## 2017-05-17 ENCOUNTER — Emergency Department (HOSPITAL_COMMUNITY)
Admission: EM | Admit: 2017-05-17 | Discharge: 2017-05-17 | Disposition: A | Discharge status: Home / Self Care | Payer: 59 | Attending: Emergency Medicine | Admitting: Emergency Medicine

## 2017-05-17 DIAGNOSIS — R002 Palpitations: Secondary | ICD-10-CM | POA: Diagnosis present

## 2017-05-17 DIAGNOSIS — Z79899 Other long term (current) drug therapy: Secondary | ICD-10-CM | POA: Diagnosis not present

## 2017-05-17 DIAGNOSIS — Z9221 Personal history of antineoplastic chemotherapy: Secondary | ICD-10-CM | POA: Diagnosis not present

## 2017-05-17 DIAGNOSIS — Z5112 Encounter for antineoplastic immunotherapy: Secondary | ICD-10-CM | POA: Diagnosis not present

## 2017-05-17 DIAGNOSIS — C50912 Malignant neoplasm of unspecified site of left female breast: Secondary | ICD-10-CM | POA: Insufficient documentation

## 2017-05-17 DIAGNOSIS — C50512 Malignant neoplasm of lower-outer quadrant of left female breast: Secondary | ICD-10-CM | POA: Diagnosis not present

## 2017-05-17 DIAGNOSIS — Z171 Estrogen receptor negative status [ER-]: Secondary | ICD-10-CM | POA: Diagnosis not present

## 2017-05-17 LAB — CBC WITH DIFFERENTIAL/PLATELET
Basophils Absolute: 0 10*3/uL (ref 0.0–0.1)
Basophils Relative: 1 %
Eosinophils Absolute: 0.1 10*3/uL (ref 0.0–0.7)
Eosinophils Relative: 2 %
HCT: 30.8 % — ABNORMAL LOW (ref 36.0–46.0)
Hemoglobin: 10 g/dL — ABNORMAL LOW (ref 12.0–15.0)
Lymphocytes Relative: 39 %
Lymphs Abs: 1.6 10*3/uL (ref 0.7–4.0)
MCH: 29.7 pg (ref 26.0–34.0)
MCHC: 32.5 g/dL (ref 30.0–36.0)
MCV: 91.4 fL (ref 78.0–100.0)
Monocytes Absolute: 0.3 10*3/uL (ref 0.1–1.0)
Monocytes Relative: 9 %
Neutro Abs: 2 10*3/uL (ref 1.7–7.7)
Neutrophils Relative %: 49 %
Platelets: 218 10*3/uL (ref 150–400)
RBC: 3.37 MIL/uL — ABNORMAL LOW (ref 3.87–5.11)
RDW: 13.8 % (ref 11.5–15.5)
WBC: 4 10*3/uL (ref 4.0–10.5)

## 2017-05-17 LAB — BASIC METABOLIC PANEL
Anion gap: 7 (ref 5–15)
BUN: 19 mg/dL (ref 6–20)
CO2: 26 mmol/L (ref 22–32)
Calcium: 9.1 mg/dL (ref 8.9–10.3)
Chloride: 107 mmol/L (ref 101–111)
Creatinine, Ser: 0.84 mg/dL (ref 0.44–1.00)
GFR calc Af Amer: >60 mL/min (ref >60)
GFR calc non Af Amer: >60 mL/min (ref >60)
Glucose, Bld: 95 mg/dL (ref 65–99)
Potassium: 3.7 mmol/L (ref 3.5–5.1)
Sodium: 140 mmol/L (ref 135–145)

## 2017-05-17 LAB — TROPONIN I: Troponin I: <0.03 ng/mL (ref <0.03)

## 2017-05-17 MED ORDER — HEPARIN SOD (PORK) LOCK FLUSH 100 UNIT/ML IV SOLN
500.0000 [IU] | Freq: Once | INTRAVENOUS | Status: AC
  Administered 2017-05-17: 500 [IU]
  Filled 2017-05-17: qty 5

## 2017-05-17 NOTE — ED Provider Notes (Signed)
[facility] DEPT Provider Note   CSN: [id number] Arrival date & time: 05/17/17  1731     History   Chief Complaint No chief complaint on file.   HPI Laura Anthony is a 42 y.o. female.  She presents for evaluation of palpitations, for the last 2 days, intermittently lasting less than 1 minute.  No associated fever, chills, diaphoresis, nausea, vomiting, weakness or dizziness.  She is taking her usual medications.  She is on oral chemotherapy for breast cancer.  She is also getting directed radiation to the left breast, for breast cancer.  No prior similar problem.  No prior known cardiac disorders.  There are no other known modifying factors.  HPI  Past Medical History:  Diagnosis Date  . ADD (attention deficit disorder)   . Anemia   . Anxiety   . 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 Breast cancer, left breast ([place])    S/P mastectomy 03/27/2017  . DVT (deep venous thrombosis) ([place]) 2017   calf left - probably due to [facility] pills-took eliquis x3 mos, nonthing now  . Family history of breast cancer   . Family history of prostate cancer   . High cholesterol   . Impingement syndrome of right shoulder 07/2013  . Migraine    "usually 1/month" (03/28/2017)  . PONV (postoperative nausea and vomiting)   . Right bicipital tenosynovitis 07/2013  . Rotator cuff impingement syndrome of right shoulder 07/12/2013  . Seizures ([place])    x 1 as a child - was never on anticonvulsants (03/28/2017)    Patient Active Problem List   Diagnosis Date Noted  . Breast cancer, stage 2, left ([place]) 03/27/2017  . Port-A-Cath in place 12/16/2016  . Genetic testing 12/06/2016  . Encounter for antineoplastic chemotherapy 11/25/2016  . Family history of breast cancer   . Family history of prostate cancer   . Malignant neoplasm of lower-outer quadrant of left breast of female, estrogen receptor negative ([place]) 10/27/2016  . Rotator  cuff impingement syndrome of left shoulder 07/12/2013    Past Surgical History:  Procedure Laterality Date  . ADENOIDECTOMY  1981  . ANKLE ARTHROSCOPY Right   . BREAST BIOPSY Left 10/2016  . KNEE ARTHROSCOPY Right   . KNEE ARTHROSCOPY W/ ACL RECONSTRUCTION Left   . MASTECTOMY Left 03/27/2017   NIPPLE SPARING MASTECTOMY WITH RADIOACTIVE SEED TARGETED LYMPH NODE EXCISION AND LEFT AXILLARY SENTINEL LYMPH NODE BIOPSY  . MASTECTOMY Right 03/27/2017   RIGHT PROPHYLACTIC NIPPLE SPARING MASTECTOMY  . NIPPLE SPARING MASTECTOMY Right 03/27/2017   Procedure: RIGHT PROPHYLACTIC NIPPLE SPARING MASTECTOMY;  Surgeon: Rolm Bookbinder, MD;  Location: [facility];  Service: General;  Laterality: Right;  . PORTACATH PLACEMENT N/A 11/01/2016   Procedure: INSERTION PORT-A-CATH WITH [place];  Surgeon: Rolm Bookbinder, MD;  Location: [facility];  Service: General;  Laterality: N/A;  . RADIOACTIVE SEED GUIDED AXILLARY SENTINEL LYMPH NODE Left 03/27/2017   Procedure: LEFT NIPPLE SPARING MASTECTOMY WITH RADIOACTIVE SEED TARGETED LYMPH NODE EXCISION AND LEFT AXILLARY SENTINEL LYMPH NODE BIOPSY;  Surgeon: Rolm Bookbinder, MD;  Location: [place];  Service: General;  Laterality: Left;  REQUESTS RNFA  . RECONSTRUCTION BREAST IMMEDIATE / DELAYED W/ TISSUE EXPANDER Bilateral 03/27/2017   BILATERAL BREAST RECONSTRUCTION WITH PLACEMENT OF TISSUE EXPANDER AND ALLODERM  . SEPTOPLASTY WITH ETHMOIDECTOMY, AND MAXILLARY ANTROSTOMY  10/29/2010   bilat. max. antrostomy with left max. stripping; left ant. ethmoidectomy; right total ethmoidectomy; sphenoidotomy  . SHOULDER ARTHROSCOPY  WITH SUBACROMIAL DECOMPRESSION AND BICEP TENDON REPAIR Right 07/12/2013   Procedure: RIGHT SHOULDER ARTHROSCOPY DEBRIDEMENT EXTENSIVE DECOMPRESSION SUBACROMIAL PARTIAL ACROMIOPLASTY;  Surgeon: Johnny Bridge, MD;  Location: [facility];  Service: Orthopedics;  Laterality: Right;  . WRIST ARTHROSCOPY  01/17/2012     Procedure: ARTHROSCOPY WRIST; right wrist Surgeon: Tennis Must, MD;  Location: [facility];  Service: Orthopedics;  Laterality: Right;  RIGHT WRIST ARTHROSCOPY WITH TRIANGULAR FIBROCARTILAGE COMPLEX REPAIR AND DEBRIDEMENT      OB History   None      Home Medications    Prior to Admission medications   Medication Sig Start Date End Date Taking? Authorizing Provider  FLUoxetine (PROZAC) 20 MG capsule Take 20 mg by mouth every morning.   Yes [provider]  lidocaine-prilocaine (EMLA) cream Apply to affected area once 10/28/16  Yes Nicholas Lose, MD  LORazepam (ATIVAN) 1 MG tablet Take 1 tablet (1 mg total) by mouth daily. Patient taking differently: Take 1 mg by mouth daily as needed for sleep.  01/06/17  Yes Nicholas Lose, MD  Nutritional Supplements (JUICE PLUS FIBRE PO) Take 2 each by mouth daily. [employer]   Yes [provider]  Nutritional Supplements (JUICE PLUS FIBRE PO) Take 2 each by mouth daily. [employer]   Yes [provider]  rizatriptan (MAXALT) 10 MG tablet Take 10 mg by mouth daily as needed for migraine. May repeat in 2 hours if needed    Yes [provider]  ferrous sulfate 325 (65 FE) MG tablet Take 1 tablet (325 mg total) by mouth 2 (two) times daily with a meal. Patient not taking: Reported on 05/17/2017 03/29/17 03/29/18  Rolm Bookbinder, MD  methocarbamol (ROBAXIN) 500 MG tablet Take 1 tablet (500 mg total) by mouth every 8 (eight) hours as needed for muscle spasms. Patient not taking: Reported on 05/17/2017 03/27/17   Irene Limbo, MD  oxyCODONE 10 MG TABS Take 0.5-1 tablets (5-10 mg total) by mouth every 4 (four) hours as needed for moderate pain. Patient not taking: Reported on 05/17/2017 03/29/17   Irene Limbo, MD  promethazine (PHENERGAN) 25 MG tablet Take 1 tablet (25 mg total) by mouth every 6 (six) hours as needed for nausea or vomiting. Patient not taking: Reported on 05/17/2017 01/20/17    Nicholas Lose, MD  sulfamethoxazole-trimethoprim (BACTRIM DS,SEPTRA DS) 800-160 MG tablet Take 1 tablet by mouth 2 (two) times daily. Patient not taking: Reported on 05/17/2017 03/27/17   Irene Limbo, MD  zolpidem (AMBIEN) 10 MG tablet Take 10 mg by mouth at bedtime as needed for sleep.    [provider]    Family History Family History  Problem Relation Age of Onset  . Breast cancer Mother 50       triple negative  . Leukemia Father   . Lung cancer Father   . Heart attack Maternal Uncle   . Prostate cancer Paternal Uncle   . COPD Paternal Grandmother   . Heart disease Paternal Grandfather   . Prostate cancer Paternal Uncle   . Leukemia Cousin     Social History Social History   Tobacco Use  . Smoking status: Never Smoker  . Smokeless tobacco: Never Used  Substance Use Topics  . Alcohol use: Yes    Alcohol/week: 0.6 oz    Types: 1 Glasses of wine per week  . Drug use: No     Allergies   Sumatriptan and Statins   Review of Systems Review of Systems  All other systems reviewed and are negative.    Physical Exam Updated Vital Signs BP 130/90 (BP Location: Left Arm)   Pulse 86   Temp 98.2 F (36.8 C) (Oral)   Resp 18   Ht 5\' 7"  (1.702 m)   Wt 68 kg (150 lb)   SpO2 96%   BMI 23.49 kg/m   Physical Exam  Constitutional: She is oriented to person, place, and time. She appears well-developed and well-nourished. No distress.  HENT:  Head: Normocephalic and atraumatic.  Eyes: Pupils are equal, round, and reactive to light. Conjunctivae and EOM are normal.  Neck: Normal range of motion and phonation normal. Neck supple.  Cardiovascular: Normal rate, regular rhythm and normal heart sounds.  Normal radial pulse, regular without tachycardia.  Pulmonary/Chest: Effort normal and breath sounds normal. She exhibits no tenderness.  Abdominal: Soft. She exhibits no distension. There is no tenderness. There is no guarding.  Musculoskeletal: Normal range of  motion.  Neurological: She is alert and oriented to person, place, and time. She exhibits normal muscle tone.  Skin: Skin is warm and dry.  Psychiatric: She has a normal mood and affect. Her behavior is normal. Judgment and thought content normal.  Nursing note and vitals reviewed.    ED Treatments / Results  Labs (all labs ordered are listed, but only abnormal results are displayed) Labs Reviewed - No data to display  EKG EKG Interpretation  Date/Time:  Wednesday May 17 2017 17:39:12 EDT Ventricular Rate:  78 PR Interval:    QRS Duration: 91 QT Interval:  404 QTC Calculation: 461 R Axis:   -5 Text Interpretation:  Sinus rhythm RSR' in V1 or V2, right VCD or RVH Nonspecific T abnormalities, lateral leads since last tracing no significant change Confirmed by Daleen Bo [phone]) on 05/17/2017 8:32:00 PM   Radiology No results found.  Procedures Procedures (including critical care time)  Medications Ordered in ED Medications - No data to display   Initial Impression / Assessment and Plan / ED Course  I have reviewed the triage vital signs and the nursing notes.  Pertinent labs & imaging results that were available during my care of the patient were reviewed by me and considered in my medical decision making (see chart for details).      Patient Vitals for the past 24 hrs:  BP Temp Temp src Pulse Resp SpO2 Height Weight  05/17/17 1745 - - - - - - 5\' 7"  (1.702 m) 68 kg (150 lb)  05/17/17 1736 130/90 98.2 F (36.8 C) Oral 86 18 96 % - -    11:12 PM Reevaluation with update and discussion. After initial assessment and treatment, an updated evaluation reveals she denies any significant palpitations since earlier evaluation.  Findings discussed with the patient and all questions answered.[place] decision making-nonspecific palpitations without worrisome findings.  Doubt ACS, PE or pneumonia.  No evidence for acute cardiac compromise.  Nursing  Notes Reviewed/ Care Coordinated Applicable Imaging Reviewed Interpretation of Laboratory Data incorporated into ED treatment  The patient appears reasonably screened and/or stabilized for discharge and I doubt any other medical condition or other [facility] requiring further screening, evaluation, or treatment in the ED at this time prior to discharge.  Plan: Home Medications-continue current medications; Home Treatments-rest, fluids, regular diet; return here if the recommended treatment, does not improve the symptoms; Recommended follow up-PCP, as needed.  Discuss palpitations with oncology, at next follow-up appointment     Final Clinical Impressions(s) /  ED Diagnoses   Final diagnoses:  Palpitations    ED Discharge Orders    None       Daleen Bo, MD 05/17/17 2314

## 2017-05-17 NOTE — ED Triage Notes (Signed)
Patient is brought in to ER today bc her heart has been racing. It has been happening for 4 days however today it has been more frequent. Cancer center suggested she come here. Patient is actively getting treatment for breast cancer along with radiation. Chemo ended at the end of January. Denies SOB.

## 2017-05-17 NOTE — Discharge Instructions (Addendum)
The testing tonight, is reassuring.  There are no signs of complications or cardiac compromise at this time.  Watch for progressive symptoms including weakness, dizziness, near syncope or syncope.  Return here, if needed, for problems.  Continue taking your usual medications, and try to eat and drink regularly.

## 2017-05-18 ENCOUNTER — Ambulatory Visit
Admission: RE | Admit: 2017-05-18 | Discharge: 2017-05-18 | Disposition: A | Discharge status: Home / Self Care | Payer: 59 | Source: Ambulatory Visit | Attending: Radiation Oncology | Admitting: Radiation Oncology

## 2017-05-18 DIAGNOSIS — C50512 Malignant neoplasm of lower-outer quadrant of left female breast: Secondary | ICD-10-CM | POA: Diagnosis not present

## 2017-05-19 ENCOUNTER — Ambulatory Visit
Admission: RE | Admit: 2017-05-19 | Discharge: 2017-05-19 | Disposition: A | Discharge status: Home / Self Care | Payer: 59 | Source: Ambulatory Visit | Attending: Radiation Oncology | Admitting: Radiation Oncology

## 2017-05-19 ENCOUNTER — Inpatient Hospital Stay: Payer: 59

## 2017-05-19 ENCOUNTER — Other Ambulatory Visit: Payer: 59

## 2017-05-19 ENCOUNTER — Ambulatory Visit: Payer: 59

## 2017-05-19 ENCOUNTER — Inpatient Hospital Stay: Payer: 59 | Attending: Hematology and Oncology

## 2017-05-19 VITALS — BP 107/68 | HR 69 | Temp 98.9°F | Resp 16

## 2017-05-19 DIAGNOSIS — Z171 Estrogen receptor negative status [ER-]: Secondary | ICD-10-CM | POA: Diagnosis not present

## 2017-05-19 DIAGNOSIS — C50512 Malignant neoplasm of lower-outer quadrant of left female breast: Secondary | ICD-10-CM | POA: Insufficient documentation

## 2017-05-19 DIAGNOSIS — Z79899 Other long term (current) drug therapy: Secondary | ICD-10-CM | POA: Diagnosis not present

## 2017-05-19 DIAGNOSIS — Z9013 Acquired absence of bilateral breasts and nipples: Secondary | ICD-10-CM | POA: Diagnosis not present

## 2017-05-19 DIAGNOSIS — Z95828 Presence of other vascular implants and grafts: Secondary | ICD-10-CM

## 2017-05-19 DIAGNOSIS — Z5112 Encounter for antineoplastic immunotherapy: Secondary | ICD-10-CM | POA: Diagnosis not present

## 2017-05-19 LAB — COMPREHENSIVE METABOLIC PANEL
ALT: 17 U/L (ref 0–55)
AST: 18 U/L (ref 5–34)
Albumin: 3.6 g/dL (ref 3.5–5.0)
Alkaline Phosphatase: 85 U/L (ref 40–150)
Anion gap: 7 (ref 3–11)
BUN: 18 mg/dL (ref 7–26)
CO2: 27 mmol/L (ref 22–29)
Calcium: 9.3 mg/dL (ref 8.4–10.4)
Chloride: 107 mmol/L (ref 98–109)
Creatinine, Ser: 0.82 mg/dL (ref 0.60–1.10)
GFR calc Af Amer: >60 mL/min (ref >60)
GFR calc non Af Amer: >60 mL/min (ref >60)
Glucose, Bld: 80 mg/dL (ref 70–140)
Potassium: 3.9 mmol/L (ref 3.5–5.1)
Sodium: 141 mmol/L (ref 136–145)
Total Bilirubin: 0.2 mg/dL (ref 0.2–1.2)
Total Protein: 7 g/dL (ref 6.4–8.3)

## 2017-05-19 LAB — CBC WITH DIFFERENTIAL/PLATELET
Basophils Absolute: 0 10*3/uL (ref 0.0–0.1)
Basophils Relative: 1 %
Eosinophils Absolute: 0.1 10*3/uL (ref 0.0–0.5)
Eosinophils Relative: 3 %
HCT: 31.9 % — ABNORMAL LOW (ref 34.8–46.6)
Hemoglobin: 10.4 g/dL — ABNORMAL LOW (ref 11.6–15.9)
Lymphocytes Relative: 28 %
Lymphs Abs: 0.9 10*3/uL (ref 0.9–3.3)
MCH: 29.3 pg (ref 25.1–34.0)
MCHC: 32.5 g/dL (ref 31.5–36.0)
MCV: 90.2 fL (ref 79.5–101.0)
Monocytes Absolute: 0.2 10*3/uL (ref 0.1–0.9)
Monocytes Relative: 7 %
Neutro Abs: 1.9 10*3/uL (ref 1.5–6.5)
Neutrophils Relative %: 61 %
Platelets: 208 10*3/uL (ref 145–400)
RBC: 3.54 MIL/uL — ABNORMAL LOW (ref 3.70–5.45)
RDW: 15.4 % — ABNORMAL HIGH (ref 11.2–14.5)
WBC: 3.2 10*3/uL — ABNORMAL LOW (ref 3.9–10.3)

## 2017-05-19 MED ORDER — HEPARIN SOD (PORK) LOCK FLUSH 100 UNIT/ML IV SOLN
500.0000 [IU] | Freq: Once | INTRAVENOUS | Status: AC | PRN
  Administered 2017-05-19: 500 [IU]
  Filled 2017-05-19: qty 5

## 2017-05-19 MED ORDER — DIPHENHYDRAMINE HCL 25 MG PO CAPS
25.0000 mg | ORAL_CAPSULE | Freq: Once | ORAL | Status: AC
  Administered 2017-05-19: 25 mg via ORAL

## 2017-05-19 MED ORDER — DIPHENHYDRAMINE HCL 25 MG PO CAPS
ORAL_CAPSULE | ORAL | Status: AC
  Filled 2017-05-19: qty 1

## 2017-05-19 MED ORDER — ACETAMINOPHEN 325 MG PO TABS
ORAL_TABLET | ORAL | Status: AC
  Filled 2017-05-19: qty 2

## 2017-05-19 MED ORDER — SODIUM CHLORIDE 0.9 % IV SOLN
420.0000 mg | Freq: Once | INTRAVENOUS | Status: AC
  Administered 2017-05-19: 420 mg via INTRAVENOUS
  Filled 2017-05-19: qty 14

## 2017-05-19 MED ORDER — SODIUM CHLORIDE 0.9 % IV SOLN
Freq: Once | INTRAVENOUS | Status: AC
  Administered 2017-05-19: 10:00:00 via INTRAVENOUS

## 2017-05-19 MED ORDER — TRASTUZUMAB CHEMO 150 MG IV SOLR
420.0000 mg | Freq: Once | INTRAVENOUS | Status: AC
  Administered 2017-05-19: 420 mg via INTRAVENOUS
  Filled 2017-05-19: qty 20

## 2017-05-19 MED ORDER — SODIUM CHLORIDE 0.9% FLUSH
10.0000 mL | INTRAVENOUS | Status: DC | PRN
  Administered 2017-05-19: 10 mL
  Filled 2017-05-19: qty 10

## 2017-05-19 MED ORDER — SODIUM CHLORIDE 0.9% FLUSH
10.0000 mL | INTRAVENOUS | Status: DC | PRN
  Administered 2017-05-19: 10 mL via INTRAVENOUS
  Filled 2017-05-19: qty 10

## 2017-05-19 MED ORDER — ACETAMINOPHEN 325 MG PO TABS
650.0000 mg | ORAL_TABLET | Freq: Once | ORAL | Status: AC
  Administered 2017-05-19: 650 mg via ORAL

## 2017-05-19 NOTE — Patient Instructions (Signed)
[facility] Discharge Instructions for Patients Receiving Chemotherapy  Today you received the following chemotherapy agents herceptin/perjeta  To help prevent nausea and vomiting after your treatment, we encourage you to take your nausea medication as directed   If you develop nausea and vomiting that is not controlled by your nausea medication, call the clinic.   BELOW ARE SYMPTOMS THAT SHOULD BE REPORTED IMMEDIATELY:  *FEVER GREATER THAN 100.5 F  *CHILLS WITH OR WITHOUT FEVER  NAUSEA AND VOMITING THAT IS NOT CONTROLLED WITH YOUR NAUSEA MEDICATION  *UNUSUAL SHORTNESS OF BREATH  *UNUSUAL BRUISING OR BLEEDING  TENDERNESS IN MOUTH AND THROAT WITH OR WITHOUT PRESENCE OF ULCERS  *URINARY PROBLEMS  *BOWEL PROBLEMS  UNUSUAL RASH Items with * indicate a potential emergency and should be followed up as soon as possible.  Feel free to call the clinic you have any questions or concerns. The clinic phone number is [phone].  

## 2017-05-19 NOTE — Progress Notes (Signed)
Per Dr. Alen Blew pt is ok to treat today 05/19/2017 with echo results from 02/13/2017. Pt aware to have echo scheduled. Message sent to scheduling and Tiffany, RN aware.

## 2017-05-19 NOTE — Progress Notes (Signed)
Vital signs obtained after Perjeta completed. Patient refused to stay for 30 minute observation.

## 2017-05-22 ENCOUNTER — Ambulatory Visit
Admission: RE | Admit: 2017-05-22 | Discharge: 2017-05-22 | Disposition: A | Discharge status: Home / Self Care | Payer: 59 | Source: Ambulatory Visit | Attending: Radiation Oncology | Admitting: Radiation Oncology

## 2017-05-22 ENCOUNTER — Telehealth: Payer: Self-pay | Admitting: Hematology and Oncology

## 2017-05-22 DIAGNOSIS — C50512 Malignant neoplasm of lower-outer quadrant of left female breast: Secondary | ICD-10-CM | POA: Diagnosis not present

## 2017-05-22 NOTE — Telephone Encounter (Signed)
Spoke to patient regarding upcoming May appointments per 4/19 sch message

## 2017-05-23 ENCOUNTER — Ambulatory Visit
Admission: RE | Admit: 2017-05-23 | Discharge: 2017-05-23 | Disposition: A | Discharge status: Home / Self Care | Payer: 59 | Source: Ambulatory Visit | Attending: Radiation Oncology | Admitting: Radiation Oncology

## 2017-05-23 DIAGNOSIS — C50512 Malignant neoplasm of lower-outer quadrant of left female breast: Secondary | ICD-10-CM | POA: Diagnosis not present

## 2017-05-23 DIAGNOSIS — Z171 Estrogen receptor negative status [ER-]: Secondary | ICD-10-CM | POA: Diagnosis not present

## 2017-05-23 DIAGNOSIS — E785 Hyperlipidemia, unspecified: Secondary | ICD-10-CM | POA: Diagnosis not present

## 2017-05-24 ENCOUNTER — Ambulatory Visit
Admission: RE | Admit: 2017-05-24 | Discharge: 2017-05-24 | Disposition: A | Discharge status: Home / Self Care | Payer: 59 | Source: Ambulatory Visit | Attending: Radiation Oncology | Admitting: Radiation Oncology

## 2017-05-24 DIAGNOSIS — C50512 Malignant neoplasm of lower-outer quadrant of left female breast: Secondary | ICD-10-CM | POA: Diagnosis not present

## 2017-05-25 ENCOUNTER — Ambulatory Visit
Admission: RE | Admit: 2017-05-25 | Discharge: 2017-05-25 | Disposition: A | Discharge status: Home / Self Care | Payer: 59 | Source: Ambulatory Visit | Attending: Radiation Oncology | Admitting: Radiation Oncology

## 2017-05-25 ENCOUNTER — Ambulatory Visit (HOSPITAL_COMMUNITY)
Admission: RE | Admit: 2017-05-25 | Discharge: 2017-05-25 | Disposition: A | Discharge status: Home / Self Care | Payer: 59 | Source: Ambulatory Visit | Attending: Adult Health | Admitting: Adult Health

## 2017-05-25 DIAGNOSIS — I5189 Other ill-defined heart diseases: Secondary | ICD-10-CM | POA: Diagnosis not present

## 2017-05-25 DIAGNOSIS — C50512 Malignant neoplasm of lower-outer quadrant of left female breast: Secondary | ICD-10-CM | POA: Diagnosis not present

## 2017-05-25 DIAGNOSIS — E785 Hyperlipidemia, unspecified: Secondary | ICD-10-CM | POA: Insufficient documentation

## 2017-05-25 DIAGNOSIS — Z171 Estrogen receptor negative status [ER-]: Secondary | ICD-10-CM | POA: Insufficient documentation

## 2017-05-25 NOTE — Progress Notes (Signed)
  Echocardiogram 2D Echocardiogram has been performed.  Laura Anthony 05/25/2017, 11:37 AM

## 2017-05-26 ENCOUNTER — Ambulatory Visit
Admission: RE | Admit: 2017-05-26 | Discharge: 2017-05-26 | Disposition: A | Discharge status: Home / Self Care | Payer: 59 | Source: Ambulatory Visit | Attending: Radiation Oncology | Admitting: Radiation Oncology

## 2017-05-26 DIAGNOSIS — C50512 Malignant neoplasm of lower-outer quadrant of left female breast: Secondary | ICD-10-CM | POA: Diagnosis not present

## 2017-05-29 ENCOUNTER — Other Ambulatory Visit: Payer: Self-pay | Admitting: Radiation Oncology

## 2017-05-29 ENCOUNTER — Encounter: Payer: Self-pay | Admitting: Radiation Oncology

## 2017-05-29 ENCOUNTER — Ambulatory Visit
Admission: RE | Admit: 2017-05-29 | Discharge: 2017-05-29 | Disposition: A | Discharge status: Home / Self Care | Payer: 59 | Source: Ambulatory Visit | Attending: Radiation Oncology | Admitting: Radiation Oncology

## 2017-05-29 DIAGNOSIS — C50512 Malignant neoplasm of lower-outer quadrant of left female breast: Secondary | ICD-10-CM | POA: Diagnosis not present

## 2017-05-29 MED ORDER — HYDROXYZINE HCL 25 MG PO TABS: 25.0000 mg | ORAL_TABLET | Freq: Three times a day (TID) | ORAL | 0 refills | Status: DC | PRN

## 2017-05-29 NOTE — Progress Notes (Signed)
Received a message from the patient regarding having some itching to her chest.  Discussed with Bryson Ha and she has prescribed hydroxyzine.  Patient was informed of this and she should receive a phone call from her pharmacy [facility] in [place] when it is ready for pickup.  Will continue to follow as necessary.  Cori Razor, RN

## 2017-05-30 ENCOUNTER — Ambulatory Visit
Admission: RE | Admit: 2017-05-30 | Discharge: 2017-05-30 | Disposition: A | Discharge status: Home / Self Care | Payer: 59 | Source: Ambulatory Visit | Attending: Radiation Oncology | Admitting: Radiation Oncology

## 2017-05-30 DIAGNOSIS — C50512 Malignant neoplasm of lower-outer quadrant of left female breast: Secondary | ICD-10-CM | POA: Diagnosis not present

## 2017-05-31 ENCOUNTER — Ambulatory Visit
Admission: RE | Admit: 2017-05-31 | Discharge: 2017-05-31 | Disposition: A | Discharge status: Home / Self Care | Payer: 59 | Source: Ambulatory Visit | Attending: Radiation Oncology | Admitting: Radiation Oncology

## 2017-05-31 DIAGNOSIS — Z510 Encounter for antineoplastic radiation therapy: Secondary | ICD-10-CM | POA: Insufficient documentation

## 2017-05-31 DIAGNOSIS — C50512 Malignant neoplasm of lower-outer quadrant of left female breast: Secondary | ICD-10-CM | POA: Diagnosis not present

## 2017-05-31 DIAGNOSIS — Z171 Estrogen receptor negative status [ER-]: Secondary | ICD-10-CM | POA: Insufficient documentation

## 2017-06-01 ENCOUNTER — Ambulatory Visit
Admission: RE | Admit: 2017-06-01 | Discharge: 2017-06-01 | Disposition: A | Discharge status: Home / Self Care | Payer: 59 | Source: Ambulatory Visit | Attending: Radiation Oncology | Admitting: Radiation Oncology

## 2017-06-01 DIAGNOSIS — C50512 Malignant neoplasm of lower-outer quadrant of left female breast: Secondary | ICD-10-CM | POA: Diagnosis not present

## 2017-06-02 ENCOUNTER — Ambulatory Visit
Admission: RE | Admit: 2017-06-02 | Discharge: 2017-06-02 | Disposition: A | Discharge status: Home / Self Care | Payer: 59 | Source: Ambulatory Visit | Attending: Radiation Oncology | Admitting: Radiation Oncology

## 2017-06-02 DIAGNOSIS — C50512 Malignant neoplasm of lower-outer quadrant of left female breast: Secondary | ICD-10-CM

## 2017-06-02 DIAGNOSIS — Z171 Estrogen receptor negative status [ER-]: Secondary | ICD-10-CM

## 2017-06-02 MED ORDER — SONAFINE EX EMUL
1.0000 "application " | Freq: Once | CUTANEOUS | Status: AC
  Administered 2017-06-02: 1 via TOPICAL

## 2017-06-05 ENCOUNTER — Ambulatory Visit
Admission: RE | Admit: 2017-06-05 | Discharge: 2017-06-05 | Disposition: A | Discharge status: Home / Self Care | Payer: 59 | Source: Ambulatory Visit | Attending: Radiation Oncology | Admitting: Radiation Oncology

## 2017-06-05 DIAGNOSIS — C50512 Malignant neoplasm of lower-outer quadrant of left female breast: Secondary | ICD-10-CM | POA: Diagnosis not present

## 2017-06-06 ENCOUNTER — Ambulatory Visit
Admission: RE | Admit: 2017-06-06 | Discharge: 2017-06-06 | Disposition: A | Discharge status: Home / Self Care | Payer: 59 | Source: Ambulatory Visit | Attending: Radiation Oncology | Admitting: Radiation Oncology

## 2017-06-06 DIAGNOSIS — C50512 Malignant neoplasm of lower-outer quadrant of left female breast: Secondary | ICD-10-CM | POA: Diagnosis not present

## 2017-06-07 ENCOUNTER — Ambulatory Visit
Admission: RE | Admit: 2017-06-07 | Discharge: 2017-06-07 | Disposition: A | Discharge status: Home / Self Care | Payer: 59 | Source: Ambulatory Visit | Attending: Radiation Oncology | Admitting: Radiation Oncology

## 2017-06-07 DIAGNOSIS — C50512 Malignant neoplasm of lower-outer quadrant of left female breast: Secondary | ICD-10-CM | POA: Diagnosis not present

## 2017-06-08 ENCOUNTER — Ambulatory Visit
Admission: RE | Admit: 2017-06-08 | Discharge: 2017-06-08 | Disposition: A | Discharge status: Home / Self Care | Payer: 59 | Source: Ambulatory Visit | Attending: Radiation Oncology | Admitting: Radiation Oncology

## 2017-06-08 DIAGNOSIS — Z171 Estrogen receptor negative status [ER-]: Secondary | ICD-10-CM | POA: Diagnosis not present

## 2017-06-08 DIAGNOSIS — Z5112 Encounter for antineoplastic immunotherapy: Secondary | ICD-10-CM | POA: Diagnosis not present

## 2017-06-08 DIAGNOSIS — C50512 Malignant neoplasm of lower-outer quadrant of left female breast: Secondary | ICD-10-CM | POA: Diagnosis not present

## 2017-06-09 ENCOUNTER — Inpatient Hospital Stay (HOSPITAL_BASED_OUTPATIENT_CLINIC_OR_DEPARTMENT_OTHER): Payer: 59 | Admitting: Hematology and Oncology

## 2017-06-09 ENCOUNTER — Ambulatory Visit: Payer: 59

## 2017-06-09 ENCOUNTER — Inpatient Hospital Stay: Payer: 59 | Attending: Hematology and Oncology

## 2017-06-09 ENCOUNTER — Inpatient Hospital Stay: Payer: 59

## 2017-06-09 ENCOUNTER — Encounter: Payer: Self-pay | Admitting: *Deleted

## 2017-06-09 ENCOUNTER — Ambulatory Visit
Admission: RE | Admit: 2017-06-09 | Discharge: 2017-06-09 | Disposition: A | Discharge status: Home / Self Care | Payer: 59 | Source: Ambulatory Visit | Attending: Radiation Oncology | Admitting: Radiation Oncology

## 2017-06-09 ENCOUNTER — Other Ambulatory Visit: Payer: 59

## 2017-06-09 DIAGNOSIS — Z171 Estrogen receptor negative status [ER-]: Secondary | ICD-10-CM | POA: Diagnosis not present

## 2017-06-09 DIAGNOSIS — Z9013 Acquired absence of bilateral breasts and nipples: Secondary | ICD-10-CM | POA: Diagnosis not present

## 2017-06-09 DIAGNOSIS — Z5112 Encounter for antineoplastic immunotherapy: Secondary | ICD-10-CM | POA: Diagnosis not present

## 2017-06-09 DIAGNOSIS — D649 Anemia, unspecified: Secondary | ICD-10-CM | POA: Insufficient documentation

## 2017-06-09 DIAGNOSIS — C50512 Malignant neoplasm of lower-outer quadrant of left female breast: Secondary | ICD-10-CM | POA: Insufficient documentation

## 2017-06-09 DIAGNOSIS — Z293 Encounter for prophylactic fluoride administration: Secondary | ICD-10-CM | POA: Diagnosis not present

## 2017-06-09 DIAGNOSIS — L598 Other specified disorders of the skin and subcutaneous tissue related to radiation: Secondary | ICD-10-CM | POA: Insufficient documentation

## 2017-06-09 DIAGNOSIS — Z79899 Other long term (current) drug therapy: Secondary | ICD-10-CM | POA: Insufficient documentation

## 2017-06-09 DIAGNOSIS — Z95828 Presence of other vascular implants and grafts: Secondary | ICD-10-CM

## 2017-06-09 LAB — COMPREHENSIVE METABOLIC PANEL
ALT: 15 U/L (ref 0–55)
AST: 18 U/L (ref 5–34)
Albumin: 3.7 g/dL (ref 3.5–5.0)
Alkaline Phosphatase: 95 U/L (ref 40–150)
Anion gap: 7 (ref 3–11)
BUN: 15 mg/dL (ref 7–26)
CO2: 28 mmol/L (ref 22–29)
Calcium: 9.3 mg/dL (ref 8.4–10.4)
Chloride: 105 mmol/L (ref 98–109)
Creatinine, Ser: 0.81 mg/dL (ref 0.60–1.10)
GFR calc Af Amer: >60 mL/min (ref >60)
GFR calc non Af Amer: >60 mL/min (ref >60)
Glucose, Bld: 96 mg/dL (ref 70–140)
Potassium: 3.8 mmol/L (ref 3.5–5.1)
Sodium: 140 mmol/L (ref 136–145)
Total Bilirubin: <0.2 mg/dL — ABNORMAL LOW (ref 0.2–1.2)
Total Protein: 7.4 g/dL (ref 6.4–8.3)

## 2017-06-09 LAB — CBC WITH DIFFERENTIAL/PLATELET
Basophils Absolute: 0 10*3/uL (ref 0.0–0.1)
Basophils Relative: 1 %
Eosinophils Absolute: 0 10*3/uL (ref 0.0–0.5)
Eosinophils Relative: 1 %
HCT: 33.4 % — ABNORMAL LOW (ref 34.8–46.6)
Hemoglobin: 10.9 g/dL — ABNORMAL LOW (ref 11.6–15.9)
Lymphocytes Relative: 15 %
Lymphs Abs: 0.7 10*3/uL — ABNORMAL LOW (ref 0.9–3.3)
MCH: 28.3 pg (ref 25.1–34.0)
MCHC: 32.8 g/dL (ref 31.5–36.0)
MCV: 86.4 fL (ref 79.5–101.0)
Monocytes Absolute: 0.3 10*3/uL (ref 0.1–0.9)
Monocytes Relative: 7 %
Neutro Abs: 3.4 10*3/uL (ref 1.5–6.5)
Neutrophils Relative %: 76 %
Platelets: 218 10*3/uL (ref 145–400)
RBC: 3.86 MIL/uL (ref 3.70–5.45)
RDW: 15.8 % — ABNORMAL HIGH (ref 11.2–14.5)
WBC: 4.5 10*3/uL (ref 3.9–10.3)

## 2017-06-09 MED ORDER — ACETAMINOPHEN 325 MG PO TABS
ORAL_TABLET | ORAL | Status: AC
  Filled 2017-06-09: qty 2

## 2017-06-09 MED ORDER — SODIUM CHLORIDE 0.9 % IV SOLN
420.0000 mg | Freq: Once | INTRAVENOUS | Status: AC
  Administered 2017-06-09: 420 mg via INTRAVENOUS
  Filled 2017-06-09: qty 14

## 2017-06-09 MED ORDER — SODIUM CHLORIDE 0.9% FLUSH
10.0000 mL | INTRAVENOUS | Status: DC | PRN
  Administered 2017-06-09: 10 mL
  Filled 2017-06-09: qty 10

## 2017-06-09 MED ORDER — TRASTUZUMAB CHEMO 150 MG IV SOLR
420.0000 mg | Freq: Once | INTRAVENOUS | Status: AC
  Administered 2017-06-09: 420 mg via INTRAVENOUS
  Filled 2017-06-09: qty 20

## 2017-06-09 MED ORDER — DIPHENHYDRAMINE HCL 25 MG PO CAPS
25.0000 mg | ORAL_CAPSULE | Freq: Once | ORAL | Status: AC
  Administered 2017-06-09: 25 mg via ORAL

## 2017-06-09 MED ORDER — HEPARIN SOD (PORK) LOCK FLUSH 100 UNIT/ML IV SOLN
500.0000 [IU] | Freq: Once | INTRAVENOUS | Status: AC | PRN
  Administered 2017-06-09: 500 [IU]
  Filled 2017-06-09: qty 5

## 2017-06-09 MED ORDER — ACETAMINOPHEN 325 MG PO TABS
650.0000 mg | ORAL_TABLET | Freq: Once | ORAL | Status: AC
  Administered 2017-06-09: 650 mg via ORAL

## 2017-06-09 MED ORDER — SODIUM CHLORIDE 0.9% FLUSH
10.0000 mL | INTRAVENOUS | Status: DC | PRN
  Administered 2017-06-09: 10 mL via INTRAVENOUS
  Filled 2017-06-09: qty 10

## 2017-06-09 MED ORDER — DIPHENHYDRAMINE HCL 25 MG PO CAPS
ORAL_CAPSULE | ORAL | Status: AC
  Filled 2017-06-09: qty 1

## 2017-06-09 MED ORDER — OXYCODONE-ACETAMINOPHEN 5-325 MG PO TABS: 1.0000 | ORAL_TABLET | ORAL | 0 refills | Status: DC | PRN

## 2017-06-09 MED ORDER — SODIUM CHLORIDE 0.9 % IV SOLN
Freq: Once | INTRAVENOUS | Status: AC
  Administered 2017-06-09: 11:00:00 via INTRAVENOUS

## 2017-06-09 MED FILL — OXYCODONE-ACETAMINOPHEN 5-3: 5-325 | 5 days supply | Qty: 30 | Fill #0

## 2017-06-09 NOTE — Progress Notes (Signed)
Patient Care Team: [name], MD as PCP - General (Family Medicine)  DIAGNOSIS:  Encounter Diagnosis  Name Primary?  . Malignant neoplasm of lower-outer quadrant of left breast of female, estrogen receptor negative ([place])     SUMMARY OF ONCOLOGIC HISTORY:   Malignant neoplasm of lower-outer quadrant of left breast of female, estrogen receptor negative ([place])   10/20/2016 Mammogram    Mammogram and ultrasound of the left breast revealed 1.7 cm mass at 4:00 position, 6:30 position 5 x 4 x 4 mm mass, 6:00 position 5 cm nipple 7 x 6 x 11 mm, left axillary lymph node with thickened cortex, T1c N1 stage II a AJCC 8      10/24/2016 Initial Diagnosis    Left breast biopsy 6:30 position 3 cm from nipple: IDC grade 2, DCIS, ER 0%, PR 0%, Ki-67 15% HER-2 positive ratio 2.1; 4:00 position 3 cm from nipple: IDC grade 2, DCIS, ER 0%, PR 0%, Ki-67 35%, HER-2 positive ratio 2.02; left axillary lymph node biopsy positive      11/04/2016 -  Neo-Adjuvant Chemotherapy    TCH Perjeta 6 cycles followed by Herceptin +/- Perjeta maintenance      11/30/2016 Genetic Testing    Negative genetic testing on the common hereditary cancer panel.  The Hereditary Gene Panel offered by [facility] includes sequencing and/or deletion duplication testing of the following 47 genes: APC, ATM, AXIN2, BARD1, BMPR1A, BRCA1, BRCA2, BRIP1, CDH1, CDK4, CDKN2A (p14ARF), CDKN2A (p16INK4a), CHEK2, CTNNA1, DICER1, EPCAM (Deletion/duplication testing only), GREM1 (promoter region deletion/duplication testing only), KIT, MEN1, MLH1, MSH2, MSH3, MSH6, MUTYH, NBN, NF1, NHTL1, PALB2, PDGFRA, PMS2, POLD1, POLE, PTEN, RAD50, RAD51C, RAD51D, SDHB, SDHC, SDHD, SMAD4, SMARCA4. STK11, TP53, TSC1, TSC2, and VHL.  The following genes were evaluated for sequence changes only: SDHA and HOXB13 c.251G>A variant only. The report date is November 30, 2016.       03/27/2017 Surgery    Bilateral mastectomies: Left mastectomy: IDC grade 2 0.9 cm, nodes  negative, right mastectomy benign, ER 0%, PR 0%, HER-2 positive ratio 2.6      05/08/2017 - 06/09/2017 Radiation Therapy    Adjuvant radiation therapy       CHIEF COMPLIANT: Follow-up on Herceptin and Perjeta  INTERVAL HISTORY: Laura Anthony is a 42 year old with above-mentioned history of left breast cancer who underwent bilateral mastectomies and reconstruction and adjuvant radiation after finishing neoadjuvant chemotherapy who is here for Herceptin and Perjeta maintenance.  She is tolerated radiation fairly well.  She is also tolerating Herceptin and Perjeta well.  Denies any nausea vomiting.  Denies any diarrhea. Patient is having severe skin erythema and discomfort related to radiation.  REVIEW OF SYSTEMS:   Constitutional: Denies fevers, chills or abnormal weight loss Eyes: Denies blurriness of vision Ears, nose, mouth, throat, and face: Denies mucositis or sore throat Respiratory: Denies cough, dyspnea or wheezes Cardiovascular: Denies palpitation, chest discomfort Gastrointestinal:  Denies nausea, heartburn or change in bowel habits Skin: Denies abnormal skin rashes Lymphatics: Denies new lymphadenopathy or easy bruising Neurological:Denies numbness, tingling or new weaknesses Behavioral/Psych: Mood is stable, no new changes  Extremities: No lower extremity edema Breast: Bilateral mastectomies with reconstruction, radiation dermatitis All other systems were reviewed with the patient and are negative.  I have reviewed the past medical history, past surgical history, social history and family history with the patient and they are unchanged from previous note.  ALLERGIES:  is allergic to sumatriptan and statins.  MEDICATIONS:  Current Outpatient Medications  Medication Sig Dispense  Refill  . ferrous sulfate 325 (65 FE) MG tablet Take 1 tablet (325 mg total) by mouth 2 (two) times daily with a meal. (Patient not taking: Reported on 05/17/2017) 60 tablet 1  . FLUoxetine  (PROZAC) 20 MG capsule Take 20 mg by mouth every morning.    . hydrOXYzine (ATARAX/VISTARIL) 25 MG tablet Take 1 tablet (25 mg total) by mouth 3 (three) times daily as needed for itching. 90 tablet 0  . lidocaine-prilocaine (EMLA) cream Apply to affected area once 30 g 3  . LORazepam (ATIVAN) 1 MG tablet Take 1 tablet (1 mg total) by mouth daily. (Patient taking differently: Take 1 mg by mouth daily as needed for sleep. ) 30 tablet 2  . methocarbamol (ROBAXIN) 500 MG tablet Take 1 tablet (500 mg total) by mouth every 8 (eight) hours as needed for muscle spasms. (Patient not taking: Reported on 05/17/2017) 20 tablet 0  . Nutritional Supplements (JUICE PLUS FIBRE PO) Take 2 each by mouth daily. [employer]    . Nutritional Supplements (JUICE PLUS FIBRE PO) Take 2 each by mouth daily. [employer]    . oxyCODONE 10 MG TABS Take 0.5-1 tablets (5-10 mg total) by mouth every 4 (four) hours as needed for moderate pain. (Patient not taking: Reported on 05/17/2017) 40 tablet 0  . promethazine (PHENERGAN) 25 MG tablet Take 1 tablet (25 mg total) by mouth every 6 (six) hours as needed for nausea or vomiting. (Patient not taking: Reported on 05/17/2017) 30 tablet 0  . rizatriptan (MAXALT) 10 MG tablet Take 10 mg by mouth daily as needed for migraine. May repeat in 2 hours if needed     . sulfamethoxazole-trimethoprim (BACTRIM DS,SEPTRA DS) 800-160 MG tablet Take 1 tablet by mouth 2 (two) times daily. (Patient not taking: Reported on 05/17/2017) 12 tablet 0  . zolpidem (AMBIEN) 10 MG tablet Take 10 mg by mouth at bedtime as needed for sleep.     No current facility-administered medications for this visit.     PHYSICAL EXAMINATION: ECOG PERFORMANCE STATUS: 1 - Symptomatic but completely ambulatory  There were no vitals filed for this visit. There were no vitals filed for this visit.  GENERAL:alert, no distress and comfortable SKIN: skin color, texture, turgor are normal, no rashes or significant  lesions EYES: normal, Conjunctiva are pink and non-injected, sclera clear OROPHARYNX:no exudate, no erythema and lips, buccal mucosa, and tongue normal  NECK: supple, thyroid normal size, non-tender, without nodularity LYMPH:  no palpable lymphadenopathy in the cervical, axillary or inguinal LUNGS: clear to auscultation and percussion with normal breathing effort HEART: regular rate & rhythm and no murmurs and no lower extremity edema ABDOMEN:abdomen soft, non-tender and normal bowel sounds MUSCULOSKELETAL:no cyanosis of digits and no clubbing  NEURO: alert & oriented x 3 with fluent speech, no focal motor/sensory deficits EXTREMITIES: No lower extremity edema  LABORATORY DATA:  I have reviewed the data as listed CMP Latest Ref Rng & Units 05/19/2017 05/17/2017 04/28/2017  Glucose 70 - 140 mg/dL 80 95 70  BUN 7 - 26 mg/dL 18 19 12   Creatinine 0.60 - 1.10 mg/dL 0.82 0.84 0.80  Sodium 136 - 145 mmol/L 141 140 140  Potassium 3.5 - 5.1 mmol/L 3.9 3.7 3.9  Chloride 98 - 109 mmol/L 107 107 105  CO2 22 - 29 mmol/L 27 26 28   Calcium 8.4 - 10.4 mg/dL 9.3 9.1 9.5  Total Protein 6.4 - 8.3 g/dL 7.0 - 7.0  Total Bilirubin 0.2 - 1.2 mg/dL 0.2 - 0.2  Alkaline Phos 40 - 150 U/L 85 - 81  AST 5 - 34 U/L 18 - 15  ALT 0 - 55 U/L 17 - 12    Lab Results  Component Value Date   WBC 3.2 (L) 05/19/2017   HGB 10.4 (L) 05/19/2017   HCT 31.9 (L) 05/19/2017   MCV 90.2 05/19/2017   PLT 208 05/19/2017   NEUTROABS 1.9 05/19/2017    ASSESSMENT & PLAN:  Malignant neoplasm of lower-outer quadrant of left breast of female, estrogen receptor negative ([facility]) Mammogram and ultrasound of the left breast revealed 1.7 cm mass at 4:00 position, 6:30 position 5 x 4 x 4 mm mass, 6:00 position 5 cm nipple 7 x 6 x 11 mm, left axillary lymph node with thickened cortex, T1c N1 stage II a AJCC 8  10/24/2016: Left breast biopsy 6:30 position 3 cm from nipple: IDC grade 2, DCIS, ER 0%, PR 0%, Ki-67 15% HER-2 positive ratio  2.1; 4:00 position 3 cm from nipple: IDC grade 2, DCIS, ER 0%, PR 0%, Ki-67 35%, HER-2 positive ratio 2.02 Lymph node biopsy positive  Treatment plan:Genetic counseling 1. Neoadjuvant chemotherapy with TCHP This would be followed by Herceptin and Perjeta maintenance for 1 year. 2. bilateral mastectomies 03/28/2016:Bilateral mastectomies: Left mastectomy: IDC grade 2 0.9 cm, nodes negative, right mastectomy benign, ER 0%, PR 0%, HER-2 positive ratio 2.6 3. Followed by adjuvant radiation 05/08/2017 to 06/09/2017 ---------------------------------------------------------------------------- Current treatment: Herceptin Perjeta maintenance Patient underwent breast reconstruction with expanders. Herceptin Perjeta toxicities: Patient is tolerating it very well.  Severe anemia: Monitoring closely. Severe radiation dermatitis: I did give a prescription for Percocet for pain relief today.  Instructed her on opioid-induced constipation and take a stool softener.  Patient's husband bought a boat. Return to clinic every 3 weeks for Herceptin and Perjeta and every 6 weeks for follow-up with me.  No orders of the defined types were placed in this encounter.  The patient has a good understanding of the overall plan. she agrees with it. she will call with any problems that may develop before the next visit here.   Harriette Ohara, MD 06/09/17

## 2017-06-09 NOTE — Assessment & Plan Note (Signed)
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 Neoadjuvant chemotherapy with TCHP This would be followed by Herceptin and Perjeta maintenance for 1 year. 2. bilateral mastectomies 03/28/2016:Bilateral mastectomies: Left mastectomy: IDC grade 2 0.9 cm, nodes negative, right mastectomy benign, ER 0%, PR 0%, HER-2 positive ratio 2.6 3. Followed by adjuvant radiation 05/08/2017 to 06/09/2017 ---------------------------------------------------------------------------- Current treatment: Herceptin Perjeta maintenance Patient underwent breast reconstruction with expanders. Herceptin Perjeta toxicities: Patient is tolerating it very well.  Severe anemia: Monitoring closely. Return to clinic every 3 weeks for Herceptin and Perjeta and every 6 weeks for follow-up with me.

## 2017-06-09 NOTE — Patient Instructions (Signed)
[facility] Discharge Instructions for Patients Receiving Chemotherapy  Today you received the following chemotherapy agents: Herceptin, Perjeta  To help prevent nausea and vomiting after your treatment, we encourage you to take your nausea medication as directed.   If you develop nausea and vomiting that is not controlled by your nausea medication, call the clinic.   BELOW ARE SYMPTOMS THAT SHOULD BE REPORTED IMMEDIATELY:  *FEVER GREATER THAN 100.5 F  *CHILLS WITH OR WITHOUT FEVER  NAUSEA AND VOMITING THAT IS NOT CONTROLLED WITH YOUR NAUSEA MEDICATION  *UNUSUAL SHORTNESS OF BREATH  *UNUSUAL BRUISING OR BLEEDING  TENDERNESS IN MOUTH AND THROAT WITH OR WITHOUT PRESENCE OF ULCERS  *URINARY PROBLEMS  *BOWEL PROBLEMS  UNUSUAL RASH Items with * indicate a potential emergency and should be followed up as soon as possible.  Feel free to call the clinic should you have any questions or concerns. The clinic phone number is [phone].  Please show the CHEMO ALERT CARD at check-in to the Emergency Department and triage nurse.   

## 2017-06-09 NOTE — Patient Instructions (Signed)

## 2017-06-09 NOTE — Progress Notes (Signed)
Pt alert and oriented, refused to stay for post 30 minutes observation.

## 2017-06-12 ENCOUNTER — Telehealth: Payer: Self-pay | Admitting: Hematology and Oncology

## 2017-06-12 ENCOUNTER — Ambulatory Visit
Admission: RE | Admit: 2017-06-12 | Discharge: 2017-06-12 | Disposition: A | Discharge status: Home / Self Care | Payer: 59 | Source: Ambulatory Visit | Attending: Radiation Oncology | Admitting: Radiation Oncology

## 2017-06-12 DIAGNOSIS — C50512 Malignant neoplasm of lower-outer quadrant of left female breast: Secondary | ICD-10-CM

## 2017-06-12 DIAGNOSIS — Z171 Estrogen receptor negative status [ER-]: Secondary | ICD-10-CM

## 2017-06-12 MED ORDER — SONAFINE EX EMUL
1.0000 "application " | Freq: Once | CUTANEOUS | Status: AC
  Administered 2017-06-12: 1 via TOPICAL

## 2017-06-12 NOTE — Telephone Encounter (Signed)
Spoke to patient regarding upcoming appointments per 5/10 los. Patient scheduled with [facility] 6/21 due to providers schedule.

## 2017-06-13 ENCOUNTER — Ambulatory Visit
Admission: RE | Admit: 2017-06-13 | Discharge: 2017-06-13 | Disposition: A | Discharge status: Home / Self Care | Payer: 59 | Source: Ambulatory Visit | Attending: Radiation Oncology | Admitting: Radiation Oncology

## 2017-06-13 ENCOUNTER — Ambulatory Visit: Payer: 59 | Admitting: Radiation Oncology

## 2017-06-13 DIAGNOSIS — C50512 Malignant neoplasm of lower-outer quadrant of left female breast: Secondary | ICD-10-CM | POA: Diagnosis not present

## 2017-06-14 ENCOUNTER — Ambulatory Visit
Admission: RE | Admit: 2017-06-14 | Discharge: 2017-06-14 | Disposition: A | Discharge status: Home / Self Care | Payer: 59 | Source: Ambulatory Visit | Attending: Radiation Oncology | Admitting: Radiation Oncology

## 2017-06-14 DIAGNOSIS — C50512 Malignant neoplasm of lower-outer quadrant of left female breast: Secondary | ICD-10-CM | POA: Diagnosis not present

## 2017-06-15 ENCOUNTER — Ambulatory Visit
Admission: RE | Admit: 2017-06-15 | Discharge: 2017-06-15 | Disposition: A | Discharge status: Home / Self Care | Payer: 59 | Source: Ambulatory Visit | Attending: Radiation Oncology | Admitting: Radiation Oncology

## 2017-06-15 DIAGNOSIS — C50512 Malignant neoplasm of lower-outer quadrant of left female breast: Secondary | ICD-10-CM | POA: Diagnosis not present

## 2017-06-16 ENCOUNTER — Ambulatory Visit
Admission: RE | Admit: 2017-06-16 | Discharge: 2017-06-16 | Disposition: A | Discharge status: Home / Self Care | Payer: 59 | Source: Ambulatory Visit | Attending: Radiation Oncology | Admitting: Radiation Oncology

## 2017-06-16 DIAGNOSIS — C50512 Malignant neoplasm of lower-outer quadrant of left female breast: Secondary | ICD-10-CM | POA: Diagnosis not present

## 2017-06-16 DIAGNOSIS — Z171 Estrogen receptor negative status [ER-]: Secondary | ICD-10-CM

## 2017-06-16 MED ORDER — SILVER SULFADIAZINE 1 % EX CREA
TOPICAL_CREAM | Freq: Once | CUTANEOUS | Status: AC
  Administered 2017-06-16: 17:00:00 via TOPICAL

## 2017-06-19 ENCOUNTER — Ambulatory Visit
Admission: RE | Admit: 2017-06-19 | Discharge: 2017-06-19 | Disposition: A | Discharge status: Home / Self Care | Payer: 59 | Source: Ambulatory Visit | Attending: Radiation Oncology | Admitting: Radiation Oncology

## 2017-06-19 DIAGNOSIS — C50512 Malignant neoplasm of lower-outer quadrant of left female breast: Secondary | ICD-10-CM | POA: Diagnosis not present

## 2017-06-20 ENCOUNTER — Ambulatory Visit
Admission: RE | Admit: 2017-06-20 | Discharge: 2017-06-20 | Disposition: A | Discharge status: Home / Self Care | Payer: 59 | Source: Ambulatory Visit | Attending: Radiation Oncology | Admitting: Radiation Oncology

## 2017-06-20 DIAGNOSIS — C50512 Malignant neoplasm of lower-outer quadrant of left female breast: Secondary | ICD-10-CM | POA: Diagnosis not present

## 2017-06-21 ENCOUNTER — Encounter: Payer: Self-pay | Admitting: Radiation Oncology

## 2017-06-21 ENCOUNTER — Ambulatory Visit
Admission: RE | Admit: 2017-06-21 | Discharge: 2017-06-21 | Disposition: A | Discharge status: Home / Self Care | Payer: 59 | Source: Ambulatory Visit | Attending: Radiation Oncology | Admitting: Radiation Oncology

## 2017-06-21 DIAGNOSIS — Z171 Estrogen receptor negative status [ER-]: Secondary | ICD-10-CM

## 2017-06-21 DIAGNOSIS — C50512 Malignant neoplasm of lower-outer quadrant of left female breast: Secondary | ICD-10-CM | POA: Diagnosis not present

## 2017-06-21 MED ORDER — SILVER SULFADIAZINE 1 % EX CREA
TOPICAL_CREAM | Freq: Once | CUTANEOUS | Status: AC
  Administered 2017-06-21: 17:00:00 via TOPICAL

## 2017-06-29 NOTE — Progress Notes (Signed)
  Radiation Oncology         [phone] ________________________________  Name: Laura Anthony MRN: [id number]  Date: 06/21/2017  DOB: 1975-07-14  End of Treatment Note  Diagnosis:   42 y.o. 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 This was delivered using a 3-D conformal, 4 field technique. The patient then received a boost to the mastectomy scar. This delivered an additional 10 Gy in 5 fractions using an en face electron field. The total dose was 60.4 Gy.  Narrative: The patient tolerated radiation treatment relatively well.   The patient had some expected skin irritation as she progressed during treatment. Moist desquamation was present at the end of treatment in the inframammary region and boost area. Her skin showed significant improvement after she started using Silvadene.  Plan: The patient has completed radiation treatment. The patient will return to radiation oncology clinic for routine followup in one month. I advised the patient to call or return sooner if they have any questions or concerns related to their recovery or treatment. ________________________________  Jodelle Gross, MD, PhD  This document serves as a record of services personally performed by Kyung Rudd, MD. It was created on his behalf by Rae Lips, a trained medical scribe. The creation of this record is based on the scribe's personal observations and the provider's statements to them. This document has been checked and approved by the attending provider.

## 2017-06-30 ENCOUNTER — Inpatient Hospital Stay: Payer: 59

## 2017-06-30 VITALS — BP 110/75 | HR 60 | Temp 98.2°F | Resp 16 | Wt 154.0 lb

## 2017-06-30 DIAGNOSIS — Z5112 Encounter for antineoplastic immunotherapy: Secondary | ICD-10-CM | POA: Diagnosis not present

## 2017-06-30 DIAGNOSIS — Z171 Estrogen receptor negative status [ER-]: Secondary | ICD-10-CM

## 2017-06-30 DIAGNOSIS — C50512 Malignant neoplasm of lower-outer quadrant of left female breast: Secondary | ICD-10-CM

## 2017-06-30 MED ORDER — SODIUM CHLORIDE 0.9 % IV SOLN
420.0000 mg | Freq: Once | INTRAVENOUS | Status: AC
  Administered 2017-06-30: 420 mg via INTRAVENOUS
  Filled 2017-06-30: qty 14

## 2017-06-30 MED ORDER — HEPARIN SOD (PORK) LOCK FLUSH 100 UNIT/ML IV SOLN
500.0000 [IU] | Freq: Once | INTRAVENOUS | Status: AC | PRN
  Administered 2017-06-30: 500 [IU]
  Filled 2017-06-30: qty 5

## 2017-06-30 MED ORDER — DIPHENHYDRAMINE HCL 25 MG PO CAPS
25.0000 mg | ORAL_CAPSULE | Freq: Once | ORAL | Status: AC
  Administered 2017-06-30: 25 mg via ORAL

## 2017-06-30 MED ORDER — SODIUM CHLORIDE 0.9 % IV SOLN
Freq: Once | INTRAVENOUS | Status: AC
  Administered 2017-06-30: 09:00:00 via INTRAVENOUS

## 2017-06-30 MED ORDER — ACETAMINOPHEN 325 MG PO TABS
650.0000 mg | ORAL_TABLET | Freq: Once | ORAL | Status: AC
  Administered 2017-06-30: 650 mg via ORAL

## 2017-06-30 MED ORDER — DIPHENHYDRAMINE HCL 25 MG PO CAPS
ORAL_CAPSULE | ORAL | Status: AC
  Filled 2017-06-30: qty 1

## 2017-06-30 MED ORDER — ACETAMINOPHEN 325 MG PO TABS
ORAL_TABLET | ORAL | Status: AC
  Filled 2017-06-30: qty 2

## 2017-06-30 MED ORDER — TRASTUZUMAB CHEMO 150 MG IV SOLR
420.0000 mg | Freq: Once | INTRAVENOUS | Status: AC
  Administered 2017-06-30: 420 mg via INTRAVENOUS
  Filled 2017-06-30: qty 20

## 2017-06-30 MED ORDER — SODIUM CHLORIDE 0.9% FLUSH
10.0000 mL | INTRAVENOUS | Status: DC | PRN
  Administered 2017-06-30: 10 mL
  Filled 2017-06-30: qty 10

## 2017-06-30 NOTE — Patient Instructions (Signed)
[facility] Discharge Instructions for Patients Receiving Chemotherapy  Today you received the following chemotherapy agents :  Herceptin, Perjeta.  To help prevent nausea and vomiting after your treatment, we encourage you to take your nausea medication as prescribed.   If you develop nausea and vomiting that is not controlled by your nausea medication, call the clinic.   BELOW ARE SYMPTOMS THAT SHOULD BE REPORTED IMMEDIATELY:  *FEVER GREATER THAN 100.5 F  *CHILLS WITH OR WITHOUT FEVER  NAUSEA AND VOMITING THAT IS NOT CONTROLLED WITH YOUR NAUSEA MEDICATION  *UNUSUAL SHORTNESS OF BREATH  *UNUSUAL BRUISING OR BLEEDING  TENDERNESS IN MOUTH AND THROAT WITH OR WITHOUT PRESENCE OF ULCERS  *URINARY PROBLEMS  *BOWEL PROBLEMS  UNUSUAL RASH Items with * indicate a potential emergency and should be followed up as soon as possible.  Feel free to call the clinic should you have any questions or concerns. The clinic phone number is [phone].  Please show the CHEMO ALERT CARD at check-in to the Emergency Department and triage nurse.   

## 2017-06-30 NOTE — Progress Notes (Signed)
Pt declined to stay for 30 minute observation period post perjeta infusion. Pt verbalized understanding of side effects and aware to call clinic or seek immediate treatment if needed. Pt had no further questions. Pt left with husband in no apparent distress.

## 2017-07-11 ENCOUNTER — Encounter: Payer: Self-pay | Admitting: Adult Health

## 2017-07-21 ENCOUNTER — Telehealth: Payer: Self-pay | Admitting: Adult Health

## 2017-07-21 ENCOUNTER — Inpatient Hospital Stay: Payer: 59

## 2017-07-21 ENCOUNTER — Ambulatory Visit (HOSPITAL_COMMUNITY)
Admission: RE | Admit: 2017-07-21 | Discharge: 2017-07-21 | Disposition: A | Discharge status: Home / Self Care | Payer: 59 | Source: Ambulatory Visit | Attending: Adult Health | Admitting: Adult Health

## 2017-07-21 ENCOUNTER — Encounter: Payer: Self-pay | Admitting: Adult Health

## 2017-07-21 ENCOUNTER — Inpatient Hospital Stay: Payer: 59 | Attending: Hematology and Oncology

## 2017-07-21 ENCOUNTER — Inpatient Hospital Stay (HOSPITAL_BASED_OUTPATIENT_CLINIC_OR_DEPARTMENT_OTHER): Payer: 59 | Admitting: Adult Health

## 2017-07-21 VITALS — BP 108/78 | HR 64 | Temp 98.7°F | Resp 19 | Ht 67.0 in | Wt 154.6 lb

## 2017-07-21 DIAGNOSIS — Z95828 Presence of other vascular implants and grafts: Secondary | ICD-10-CM

## 2017-07-21 DIAGNOSIS — Z9013 Acquired absence of bilateral breasts and nipples: Secondary | ICD-10-CM | POA: Insufficient documentation

## 2017-07-21 DIAGNOSIS — Z79899 Other long term (current) drug therapy: Secondary | ICD-10-CM

## 2017-07-21 DIAGNOSIS — M79672 Pain in left foot: Secondary | ICD-10-CM | POA: Diagnosis not present

## 2017-07-21 DIAGNOSIS — Z171 Estrogen receptor negative status [ER-]: Secondary | ICD-10-CM | POA: Insufficient documentation

## 2017-07-21 DIAGNOSIS — C50512 Malignant neoplasm of lower-outer quadrant of left female breast: Secondary | ICD-10-CM

## 2017-07-21 DIAGNOSIS — Z5112 Encounter for antineoplastic immunotherapy: Secondary | ICD-10-CM | POA: Diagnosis not present

## 2017-07-21 DIAGNOSIS — Z923 Personal history of irradiation: Secondary | ICD-10-CM

## 2017-07-21 LAB — CBC WITH DIFFERENTIAL (CANCER CENTER ONLY)
Basophils Absolute: 0 10*3/uL (ref 0.0–0.1)
Basophils Relative: 1 %
Eosinophils Absolute: 0.1 10*3/uL (ref 0.0–0.5)
Eosinophils Relative: 2 %
HCT: 35.9 % (ref 34.8–46.6)
Hemoglobin: 11.5 g/dL — ABNORMAL LOW (ref 11.6–15.9)
Lymphocytes Relative: 26 %
Lymphs Abs: 1 10*3/uL (ref 0.9–3.3)
MCH: 27 pg (ref 25.1–34.0)
MCHC: 32 g/dL (ref 31.5–36.0)
MCV: 84.3 fL (ref 79.5–101.0)
Monocytes Absolute: 0.4 10*3/uL (ref 0.1–0.9)
Monocytes Relative: 10 %
Neutro Abs: 2.5 10*3/uL (ref 1.5–6.5)
Neutrophils Relative %: 61 %
Platelet Count: 197 10*3/uL (ref 145–400)
RBC: 4.26 MIL/uL (ref 3.70–5.45)
RDW: 15.4 % — ABNORMAL HIGH (ref 11.2–14.5)
WBC Count: 4 10*3/uL (ref 3.9–10.3)

## 2017-07-21 LAB — COMPREHENSIVE METABOLIC PANEL
ALT: 12 U/L (ref 0–55)
AST: 19 U/L (ref 5–34)
Albumin: 3.8 g/dL (ref 3.5–5.0)
Alkaline Phosphatase: 110 U/L (ref 40–150)
Anion gap: 8 (ref 3–11)
BUN: 14 mg/dL (ref 7–26)
CO2: 27 mmol/L (ref 22–29)
Calcium: 9.6 mg/dL (ref 8.4–10.4)
Chloride: 104 mmol/L (ref 98–109)
Creatinine, Ser: 0.97 mg/dL (ref 0.60–1.10)
GFR calc Af Amer: >60 mL/min (ref >60)
GFR calc non Af Amer: >60 mL/min (ref >60)
Glucose, Bld: 99 mg/dL (ref 70–140)
Potassium: 3.9 mmol/L (ref 3.5–5.1)
Sodium: 139 mmol/L (ref 136–145)
Total Bilirubin: 0.3 mg/dL (ref 0.2–1.2)
Total Protein: 7.4 g/dL (ref 6.4–8.3)

## 2017-07-21 MED ORDER — DIPHENHYDRAMINE HCL 25 MG PO CAPS
25.0000 mg | ORAL_CAPSULE | Freq: Once | ORAL | Status: AC
  Administered 2017-07-21: 25 mg via ORAL

## 2017-07-21 MED ORDER — SODIUM CHLORIDE 0.9% FLUSH
10.0000 mL | INTRAVENOUS | Status: DC | PRN
  Administered 2017-07-21: 10 mL
  Filled 2017-07-21: qty 10

## 2017-07-21 MED ORDER — ACETAMINOPHEN 325 MG PO TABS
650.0000 mg | ORAL_TABLET | Freq: Once | ORAL | Status: AC
  Administered 2017-07-21: 650 mg via ORAL

## 2017-07-21 MED ORDER — SODIUM CHLORIDE 0.9 % IV SOLN
420.0000 mg | Freq: Once | INTRAVENOUS | Status: AC
  Administered 2017-07-21: 420 mg via INTRAVENOUS
  Filled 2017-07-21: qty 14

## 2017-07-21 MED ORDER — TRASTUZUMAB CHEMO 150 MG IV SOLR
420.0000 mg | Freq: Once | INTRAVENOUS | Status: AC
  Administered 2017-07-21: 420 mg via INTRAVENOUS
  Filled 2017-07-21: qty 20

## 2017-07-21 MED ORDER — SODIUM CHLORIDE 0.9 % IV SOLN
Freq: Once | INTRAVENOUS | Status: AC
  Administered 2017-07-21: 10:00:00 via INTRAVENOUS

## 2017-07-21 MED ORDER — HEPARIN SOD (PORK) LOCK FLUSH 100 UNIT/ML IV SOLN
500.0000 [IU] | Freq: Once | INTRAVENOUS | Status: AC | PRN
  Administered 2017-07-21: 500 [IU]
  Filled 2017-07-21: qty 5

## 2017-07-21 MED ORDER — ACETAMINOPHEN 325 MG PO TABS
ORAL_TABLET | ORAL | Status: AC
  Filled 2017-07-21: qty 2

## 2017-07-21 MED ORDER — DIPHENHYDRAMINE HCL 25 MG PO CAPS
ORAL_CAPSULE | ORAL | Status: AC
  Filled 2017-07-21: qty 1

## 2017-07-21 MED ORDER — SODIUM CHLORIDE 0.9% FLUSH
10.0000 mL | INTRAVENOUS | Status: DC | PRN
  Administered 2017-07-21: 10 mL via INTRAVENOUS
  Filled 2017-07-21: qty 10

## 2017-07-21 NOTE — Patient Instructions (Signed)
[facility] Discharge Instructions for Patients Receiving Chemotherapy  Today you received the following chemotherapy agents :  Herceptin, Perjeta.  To help prevent nausea and vomiting after your treatment, we encourage you to take your nausea medication as prescribed.   If you develop nausea and vomiting that is not controlled by your nausea medication, call the clinic.   BELOW ARE SYMPTOMS THAT SHOULD BE REPORTED IMMEDIATELY:  *FEVER GREATER THAN 100.5 F  *CHILLS WITH OR WITHOUT FEVER  NAUSEA AND VOMITING THAT IS NOT CONTROLLED WITH YOUR NAUSEA MEDICATION  *UNUSUAL SHORTNESS OF BREATH  *UNUSUAL BRUISING OR BLEEDING  TENDERNESS IN MOUTH AND THROAT WITH OR WITHOUT PRESENCE OF ULCERS  *URINARY PROBLEMS  *BOWEL PROBLEMS  UNUSUAL RASH Items with * indicate a potential emergency and should be followed up as soon as possible.  Feel free to call the clinic should you have any questions or concerns. The clinic phone number is [phone].  Please show the CHEMO ALERT CARD at check-in to the Emergency Department and triage nurse.   

## 2017-07-21 NOTE — Telephone Encounter (Signed)
Per 6/21 no los 

## 2017-07-21 NOTE — Patient Instructions (Signed)
Implanted Port Home Guide An implanted port is a type of central line that is placed under the skin. Central lines are used to provide IV access when treatment or nutrition needs to be given through a person's veins. Implanted ports are used for long-term IV access. An implanted port may be placed because:  You need IV medicine that would be irritating to the small veins in your hands or arms.  You need long-term IV medicines, such as antibiotics.  You need IV nutrition for a long period.  You need frequent blood draws for lab tests.  You need dialysis.  Implanted ports are usually placed in the chest area, but they can also be placed in the upper arm, the abdomen, or the leg. An implanted port has two main parts:  Reservoir. The reservoir is round and will appear as a small, raised area under your skin. The reservoir is the part where a needle is inserted to give medicines or draw blood.  Catheter. The catheter is a thin, flexible tube that extends from the reservoir. The catheter is placed into a large vein. Medicine that is inserted into the reservoir goes into the catheter and then into the vein.  How will I care for my incision site? Do not get the incision site wet. Bathe or shower as directed by your health care provider. How is my port accessed? Special steps must be taken to access the port:  Before the port is accessed, a numbing cream can be placed on the skin. This helps numb the skin over the port site.  Your health care provider uses a sterile technique to access the port. ? Your health care provider must put on a mask and sterile gloves. ? The skin over your port is cleaned carefully with an antiseptic and allowed to dry. ? The port is gently pinched between sterile gloves, and a needle is inserted into the port.  Only "non-coring" port needles should be used to access the port. Once the port is accessed, a blood return should be checked. This helps ensure that the port  is in the vein and is not clogged.  If your port needs to remain accessed for a constant infusion, a clear (transparent) bandage will be placed over the needle site. The bandage and needle will need to be changed every week, or as directed by your health care provider.  Keep the bandage covering the needle clean and dry. Do not get it wet. Follow your health care provider's instructions on how to take a shower or bath while the port is accessed.  If your port does not need to stay accessed, no bandage is needed over the port.  What is flushing? Flushing helps keep the port from getting clogged. Follow your health care provider's instructions on how and when to flush the port. Ports are usually flushed with saline solution or a medicine called heparin. The need for flushing will depend on how the port is used.  If the port is used for intermittent medicines or blood draws, the port will need to be flushed: ? After medicines have been given. ? After blood has been drawn. ? As part of routine maintenance.  If a constant infusion is running, the port may not need to be flushed.  How long will my port stay implanted? The port can stay in for as long as your health care provider thinks it is needed. When it is time for the port to come out, surgery will be   done to remove it. The procedure is similar to the one performed when the port was put in. When should I seek immediate medical care? When you have an implanted port, you should seek immediate medical care if:  You notice a bad smell coming from the incision site.  You have swelling, redness, or drainage at the incision site.  You have more swelling or pain at the port site or the surrounding area.  You have a fever that is not controlled with medicine.  This information is not intended to replace advice given to you by your health care provider. Make sure you discuss any questions you have with your health care provider. Document  Released: 01/17/2005 Document Revised: 06/25/2015 Document Reviewed: 09/24/2012 Elsevier Interactive Patient Education  2017 [employer]  

## 2017-07-21 NOTE — Progress Notes (Signed)
[facility] Follow up:    Laura Noon, MD [place] [id number]   DIAGNOSIS: Cancer Staging Malignant neoplasm of lower-outer quadrant of left breast of female, estrogen receptor negative ([place]) Staging form: Breast, AJCC 8th Edition - Clinical: Stage IIA (cT1c, cN1, cM0, G2, ER: Negative, PR: Negative, HER2: Positive) - Signed by Gardenia Phlegm, NP on 11/02/2016 - Pathologic: No Stage Recommended (ypT1b, pN0, cM0, G2, ER: Negative, PR: Negative, HER2: Positive) - Unsigned   SUMMARY OF ONCOLOGIC HISTORY:   Malignant neoplasm of lower-outer quadrant of left breast of female, estrogen receptor negative ([place])   10/20/2016 Mammogram    Mammogram and ultrasound of the left breast revealed 1.7 cm mass at 4:00 position, 6:30 position 5 x 4 x 4 mm mass, 6:00 position 5 cm nipple 7 x 6 x 11 mm, left axillary lymph node with thickened cortex, T1c N1 stage II a AJCC 8      10/24/2016 Initial Diagnosis    Left breast biopsy 6:30 position 3 cm from nipple: IDC grade 2, DCIS, ER 0%, PR 0%, Ki-67 15% HER-2 positive ratio 2.1; 4:00 position 3 cm from nipple: IDC grade 2, DCIS, ER 0%, PR 0%, Ki-67 35%, HER-2 positive ratio 2.02; left axillary lymph node biopsy positive      11/04/2016 -  Neo-Adjuvant Chemotherapy    TCH Perjeta 6 cycles followed by Herceptin +/- Perjeta maintenance      11/30/2016 Genetic Testing    Negative genetic testing on the common hereditary cancer panel.  The Hereditary Gene Panel offered by [facility] includes sequencing and/or deletion duplication testing of the following 47 genes: APC, ATM, AXIN2, BARD1, BMPR1A, BRCA1, BRCA2, BRIP1, CDH1, CDK4, CDKN2A (p14ARF), CDKN2A (p16INK4a), CHEK2, CTNNA1, DICER1, EPCAM (Deletion/duplication testing only), GREM1 (promoter region deletion/duplication testing only), KIT, MEN1, MLH1, MSH2, MSH3, MSH6, MUTYH, NBN, NF1, NHTL1, PALB2, PDGFRA, PMS2, POLD1, POLE, PTEN, RAD50, RAD51C, RAD51D, SDHB,  SDHC, SDHD, SMAD4, SMARCA4. STK11, TP53, TSC1, TSC2, and VHL.  The following genes were evaluated for sequence changes only: SDHA and HOXB13 c.251G>A variant only. The report date is November 30, 2016.       03/27/2017 Surgery    Bilateral mastectomies: Left mastectomy: IDC grade 2 0.9 cm, nodes negative, right mastectomy benign, ER 0%, PR 0%, HER-2 positive ratio 2.6      05/08/2017 - 06/09/2017 Radiation Therapy    Adjuvant radiation therapy       CURRENT THERAPY: Herceptin/Perjeta  INTERVAL HISTORY: Laura Anthony Sleep 42 y.o. female returns for evaluation prior to receiving Herceptin and Perjeta.  She is doing well today.  She has some unexplained foot pain on the top of her left foot that has been going on for about 2 weeks.  It isn't interfering with her ability to walk, it is just bothersome, and she is concerned about what might be causing it.    Sharron also notes palpitations that she has experienced last month that she thought might be related to anxiety.  This was before her echo, and her echo was normal, however she didn't really think anything of it.  She denies any other issues today such as chest pain, leg swelling, DOE, orthopnea.     Patient Active Problem List   Diagnosis Date Noted  . Breast cancer, stage 2, left ([place]) 03/27/2017  . Port-A-Cath in place 12/16/2016  . Genetic testing 12/06/2016  . Encounter for antineoplastic chemotherapy 11/25/2016  . Family history of breast cancer   . Family  history of prostate cancer   . Malignant neoplasm of lower-outer quadrant of left breast of female, estrogen receptor negative ([place]) 10/27/2016  . Rotator cuff impingement syndrome of left shoulder 07/12/2013    is allergic to sumatriptan and statins.  MEDICAL HISTORY: Past Medical History:  Diagnosis Date  . ADD (attention deficit disorder)   . Anemia   . Anxiety   . 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 Breast cancer, left  breast ([place])    S/P mastectomy 03/27/2017  . DVT (deep venous thrombosis) ([place]) 2017   calf left - probably due to [facility] pills-took eliquis x3 mos, nonthing now  . Family history of breast cancer   . Family history of prostate cancer   . High cholesterol   . Impingement syndrome of right shoulder 07/2013  . Migraine    "usually 1/month" (03/28/2017)  . PONV (postoperative nausea and vomiting)   . Right bicipital tenosynovitis 07/2013  . Rotator cuff impingement syndrome of right shoulder 07/12/2013  . Seizures ([place])    x 1 as a child - was never on anticonvulsants (03/28/2017)    SURGICAL HISTORY: Past Surgical History:  Procedure Laterality Date  . ADENOIDECTOMY  1981  . ANKLE ARTHROSCOPY Right   . BREAST BIOPSY Left 10/2016  . KNEE ARTHROSCOPY Right   . KNEE ARTHROSCOPY W/ ACL RECONSTRUCTION Left   . MASTECTOMY Left 03/27/2017   NIPPLE SPARING MASTECTOMY WITH RADIOACTIVE SEED TARGETED LYMPH NODE EXCISION AND LEFT AXILLARY SENTINEL LYMPH NODE BIOPSY  . MASTECTOMY Right 03/27/2017   RIGHT PROPHYLACTIC NIPPLE SPARING MASTECTOMY  . NIPPLE SPARING MASTECTOMY Right 03/27/2017   Procedure: RIGHT PROPHYLACTIC NIPPLE SPARING MASTECTOMY;  Surgeon: Rolm Bookbinder, MD;  Location: [place];  Service: General;  Laterality: Right;  . PORTACATH PLACEMENT N/A 11/01/2016   Procedure: INSERTION PORT-A-CATH WITH [place];  Surgeon: Rolm Bookbinder, MD;  Location: [place];  Service: General;  Laterality: N/A;  . RADIOACTIVE SEED GUIDED AXILLARY SENTINEL LYMPH NODE Left 03/27/2017   Procedure: LEFT NIPPLE SPARING MASTECTOMY WITH RADIOACTIVE SEED TARGETED LYMPH NODE EXCISION AND LEFT AXILLARY SENTINEL LYMPH NODE BIOPSY;  Surgeon: Rolm Bookbinder, MD;  Location: [place];  Service: General;  Laterality: Left;  REQUESTS RNFA  . RECONSTRUCTION BREAST IMMEDIATE / DELAYED W/ TISSUE EXPANDER Bilateral 03/27/2017   BILATERAL BREAST RECONSTRUCTION WITH PLACEMENT OF TISSUE EXPANDER AND  ALLODERM  . SEPTOPLASTY WITH ETHMOIDECTOMY, AND MAXILLARY ANTROSTOMY  10/29/2010   bilat. max. antrostomy with left max. stripping; left ant. ethmoidectomy; right total ethmoidectomy; sphenoidotomy  . SHOULDER ARTHROSCOPY WITH SUBACROMIAL DECOMPRESSION AND BICEP TENDON REPAIR Right 07/12/2013   Procedure: RIGHT SHOULDER ARTHROSCOPY DEBRIDEMENT EXTENSIVE DECOMPRESSION SUBACROMIAL PARTIAL ACROMIOPLASTY;  Surgeon: Johnny Bridge, MD;  Location: [place];  Service: Orthopedics;  Laterality: Right;  . WRIST ARTHROSCOPY  01/17/2012   Procedure: ARTHROSCOPY WRIST; right wrist Surgeon: Tennis Must, MD;  Location: [place];  Service: Orthopedics;  Laterality: Right;  RIGHT WRIST ARTHROSCOPY WITH TRIANGULAR FIBROCARTILAGE COMPLEX REPAIR AND DEBRIDEMENT     SOCIAL HISTORY: Social History   Socioeconomic History  . Marital status: Married    Spouse name: Not on file  . Number of children: Not on file  . Years of education: Not on file  . Highest education level: Not on file  Occupational History  . Not on file  Social Needs  . Financial resource strain: Not on file  . Food insecurity:    Worry: Not on file  Inability: Not on file  . Transportation needs:    Medical: Not on file    Non-medical: Not on file  Tobacco Use  . Smoking status: Never Smoker  . Smokeless tobacco: Never Used  Substance and Sexual Activity  . Alcohol use: Yes    Alcohol/week: 0.6 oz    Types: 1 Glasses of wine per week  . Drug use: No  . Sexual activity: Yes    Birth control/protection: Surgical    Comment: husband has had a vasectomy  Lifestyle  . Physical activity:    Days per week: Not on file    Minutes per session: Not on file  . Stress: Not on file  Relationships  . Social connections:    Talks on phone: Not on file    Gets together: Not on file    Attends religious service: Not on file    Active member of club or organization: Not on file    Attends meetings of  clubs or organizations: Not on file    Relationship status: Not on file  . Intimate partner violence:    Fear of current or ex partner: Not on file    Emotionally abused: Not on file    Physically abused: Not on file    Forced sexual activity: Not on file  Other Topics Concern  . Not on file  Social History Narrative  . Not on file    FAMILY HISTORY: Family History  Problem Relation Age of Onset  . Breast cancer Mother 44       triple negative  . Leukemia Father   . Lung cancer Father   . Heart attack Maternal Uncle   . Prostate cancer Paternal Uncle   . COPD Paternal Grandmother   . Heart disease Paternal Grandfather   . Prostate cancer Paternal Uncle   . Leukemia Cousin     Review of Systems  Constitutional: Negative for appetite change, chills, fatigue, fever and unexpected weight change.  HENT:   Negative for hearing loss, lump/mass, mouth sores and trouble swallowing (occ pain in left side of throat when swallowing x 1 week).   Eyes: Negative for eye problems.  Respiratory: Negative for chest tightness, cough and shortness of breath.   Cardiovascular: Positive for palpitations. Negative for chest pain and leg swelling.  Gastrointestinal: Negative for abdominal distention, abdominal pain, constipation, diarrhea, nausea and vomiting.  Endocrine: Negative for hot flashes.  Skin: Negative for itching.  Neurological: Negative for dizziness, extremity weakness, headaches and numbness.  Hematological: Negative for adenopathy. Does not bruise/bleed easily.  Psychiatric/Behavioral: Negative for depression. The patient is not nervous/anxious.       PHYSICAL EXAMINATION  ECOG PERFORMANCE STATUS: 1 - Symptomatic but completely ambulatory  Vitals:   07/21/17 0910  BP: 108/78  Pulse: 64  Resp: 19  Temp: 98.7 F (37.1 C)  SpO2: 100%    Physical Exam  Constitutional: She is oriented to person, place, and time. She appears well-developed and well-nourished.  HENT:   Head: Normocephalic and atraumatic.  Mouth/Throat: Oropharynx is clear and moist. No oropharyngeal exudate.  Eyes: Pupils are equal, round, and reactive to light. No scleral icterus.  Neck: Neck supple.  Cardiovascular: Normal rate, regular rhythm and normal heart sounds.  Pulmonary/Chest: Effort normal and breath sounds normal.  Abdominal: Soft. Bowel sounds are normal.  Musculoskeletal: Normal range of motion. She exhibits no edema.  Lymphadenopathy:    She has no cervical adenopathy.  Neurological: She is alert and oriented to person,  place, and time.  Skin: Skin is warm and dry. No rash noted.  Psychiatric: She has a normal mood and affect.    LABORATORY DATA:  CBC    Component Value Date/Time   WBC 4.0 07/21/2017 0847   WBC 4.5 06/09/2017 0936   RBC 4.26 07/21/2017 0847   HGB 11.5 (L) 07/21/2017 0847   HGB 11.1 (L) 01/27/2017 0802   HCT 35.9 07/21/2017 0847   HCT 33.4 (L) 01/27/2017 0802   PLT 197 07/21/2017 0847   PLT 170 01/27/2017 0802   MCV 84.3 07/21/2017 0847   MCV 99.0 01/27/2017 0802   MCH 27.0 07/21/2017 0847   MCHC 32.0 07/21/2017 0847   RDW 15.4 (H) 07/21/2017 0847   RDW 18.2 (H) 01/27/2017 0802   LYMPHSABS 1.0 07/21/2017 0847   LYMPHSABS 1.7 01/27/2017 0802   MONOABS 0.4 07/21/2017 0847   MONOABS 0.5 01/27/2017 0802   EOSABS 0.1 07/21/2017 0847   EOSABS 0.0 01/27/2017 0802   BASOSABS 0.0 07/21/2017 0847   BASOSABS 0.0 01/27/2017 0802    CMP     Component Value Date/Time   NA 140 06/09/2017 0936   NA 138 01/27/2017 0802   K 3.8 06/09/2017 0936   K 4.1 01/27/2017 0802   CL 105 06/09/2017 0936   CO2 28 06/09/2017 0936   CO2 26 01/27/2017 0802   GLUCOSE 96 06/09/2017 0936   GLUCOSE 90 01/27/2017 0802   BUN 15 06/09/2017 0936   BUN 14.0 01/27/2017 0802   CREATININE 0.81 06/09/2017 0936   CREATININE 0.7 01/27/2017 0802   CALCIUM 9.3 06/09/2017 0936   CALCIUM 8.6 01/27/2017 0802   PROT 7.4 06/09/2017 0936   PROT 6.5 01/27/2017 0802    ALBUMIN 3.7 06/09/2017 0936   ALBUMIN 3.5 01/27/2017 0802   AST 18 06/09/2017 0936   AST 22 01/27/2017 0802   ALT 15 06/09/2017 0936   ALT 23 01/27/2017 0802   ALKPHOS 95 06/09/2017 0936   ALKPHOS 85 01/27/2017 0802   BILITOT <0.2 (L) 06/09/2017 0936   BILITOT 0.23 01/27/2017 0802   GFRNONAA >60 06/09/2017 0936   GFRAA >60 06/09/2017 0936          ASSESSMENT and PLAN:   Malignant neoplasm of lower-outer quadrant of left breast of female, estrogen receptor negative ([place]) Mammogram and ultrasound of the left breast revealed 1.7 cm mass at 4:00 position, 6:30 position 5 x 4 x 4 mm mass, 6:00 position 5 cm nipple 7 x 6 x 11 mm, left axillary lymph node with thickened cortex, T1c N1 stage II a AJCC 8  10/24/2016: Left breast biopsy 6:30 position 3 cm from nipple: IDC grade 2, DCIS, ER 0%, PR 0%, 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 Neoadjuvant chemotherapy with TCHP This would be followed by Herceptin and Perjeta maintenance for 1 year. 2. bilateral mastectomies 03/28/2016:Bilateral mastectomies: Left mastectomy: IDC grade 2 0.9 cm, nodes negative, right mastectomy benign, ER 0%, PR 0%, HER-2 positive ratio 2.6 3. Followed by adjuvant radiation 05/08/2017 to 06/09/2017 ---------------------------------------------------------------------------- Current treatment: Herceptin Perjeta maintenance Last Echo: 05/25/17 EF 55-60%  Laura Anthony is doing well today.  She continues to tolerate Herceptin and Perjeta well and will proceed with this today.  I ordered plain films of her left foot to evaluate this new onset of pain.    I will reach out to  Dr. Haroldine Laws or Dr. Aundra Dubin to see Tatiyana due to her having palpitations.  While anxiety could have contributed, I let her know that it was a diagnosis of exclusion, and while receiving  Herceptin, we needed to ensure she wasn't having any other issues.    Return to clinic every 3 weeks for Herceptin and Perjeta and every 6 weeks for follow-up with myself or Dr. Lindi Adie.      Orders Placed This Encounter  Procedures  . DG Foot Complete Left    Standing Status:   Future    Number of Occurrences:   1    Standing Expiration Date:   09/21/2018    Order Specific Question:   Reason for Exam (SYMPTOM  OR DIAGNOSIS REQUIRED)    Answer:   left foot pain, h/o breast cancer    Order Specific Question:   Is patient pregnant?    Answer:   No    Order Specific Question:   Preferred imaging location?    Answer:   [facility]    Order Specific Question:   Radiology Contrast Protocol - do NOT remove file path    Answer:   [email or web address]  . ECHOCARDIOGRAM COMPLETE    Standing Status:   Future    Standing Expiration Date:   10/22/2018    Scheduling Instructions:     Due 08/24/17    Order Specific Question:   Where should this test be performed    Answer:   [place]    Order Specific Question:   Perflutren DEFINITY (image enhancing agent) should be administered unless hypersensitivity or allergy exist    Answer:   Administer Perflutren    Order Specific Question:   Expected Date:    Answer:   1 month    All questions were answered. The patient knows to call the clinic with any problems, questions or concerns. We can certainly see the patient much sooner if necessary.  A total of (30) minutes of face-to-face time was spent with this patient with greater than 50% of that time in counseling and care-coordination.  This note was electronically signed. Scot Dock, NP 07/21/2017

## 2017-07-21 NOTE — Progress Notes (Signed)
Patient refused to stay 30 minute post perjeta observation. Patient in no distress, left accompanied by husband.

## 2017-07-21 NOTE — Assessment & Plan Note (Addendum)
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 Neoadjuvant chemotherapy with TCHP This would be followed by Herceptin and Perjeta maintenance for 1 year. 2. bilateral mastectomies 03/28/2016:Bilateral mastectomies: Left mastectomy: IDC grade 2 0.9 cm, nodes negative, right mastectomy benign, ER 0%, PR 0%, HER-2 positive ratio 2.6 3. Followed by adjuvant radiation 05/08/2017 to 06/09/2017 ---------------------------------------------------------------------------- Current treatment: Herceptin Perjeta maintenance Last Echo: 05/25/17 EF 55-60%  Laura Anthony is doing well today.  She continues to tolerate Herceptin and Perjeta well and will proceed with this today.  I ordered plain films of her left foot to evaluate this new onset of pain.    I will reach out to Dr. Haroldine Laws or Dr. Aundra Dubin to see Natonya due to her having palpitations.  While anxiety could have contributed, I let her know that it was a diagnosis of exclusion, and while receiving Herceptin, we needed to ensure she wasn't having any other issues.    Return to clinic every 3 weeks for Herceptin and Perjeta and every 6 weeks for follow-up with myself or Dr. Lindi Adie.

## 2017-07-24 ENCOUNTER — Encounter: Payer: Self-pay | Admitting: Adult Health

## 2017-07-25 ENCOUNTER — Telehealth (HOSPITAL_COMMUNITY): Payer: Self-pay | Admitting: Cardiology

## 2017-07-25 NOTE — Telephone Encounter (Signed)
Called and left message for patient to call back.  Need to give her New Breast Cancer pt appt info with Dr. Aundra Dubin.

## 2017-07-26 ENCOUNTER — Ambulatory Visit: Admission: RE | Admit: 2017-07-26 | Payer: 59 | Source: Ambulatory Visit | Admitting: Radiation Oncology

## 2017-07-26 ENCOUNTER — Telehealth: Payer: Self-pay

## 2017-07-26 NOTE — Telephone Encounter (Signed)
Spoke with pt, she is aware of appt 09/08/17 @10  am with Dr. Aundra Dubin.  New pt packet mailed to pt.

## 2017-07-26 NOTE — Telephone Encounter (Signed)
Call placed to patient informing of normal xray of foot.  NP recommends to see podiatrist if pain persists.  Patient voice understanding with no other questions/concerns.  Will call if other issues arise.

## 2017-08-07 ENCOUNTER — Encounter: Payer: Self-pay | Admitting: Radiation Oncology

## 2017-08-07 ENCOUNTER — Ambulatory Visit
Admission: RE | Admit: 2017-08-07 | Discharge: 2017-08-07 | Disposition: A | Discharge status: Home / Self Care | Payer: 59 | Source: Ambulatory Visit | Attending: Radiation Oncology | Admitting: Radiation Oncology

## 2017-08-07 ENCOUNTER — Other Ambulatory Visit: Payer: Self-pay

## 2017-08-07 VITALS — BP 109/70 | HR 75 | Temp 99.1°F | Resp 18 | Ht 67.0 in | Wt 157.6 lb

## 2017-08-07 DIAGNOSIS — Z888 Allergy status to other drugs, medicaments and biological substances status: Secondary | ICD-10-CM | POA: Insufficient documentation

## 2017-08-07 DIAGNOSIS — C50512 Malignant neoplasm of lower-outer quadrant of left female breast: Secondary | ICD-10-CM

## 2017-08-07 DIAGNOSIS — C50912 Malignant neoplasm of unspecified site of left female breast: Secondary | ICD-10-CM | POA: Diagnosis not present

## 2017-08-07 DIAGNOSIS — Z171 Estrogen receptor negative status [ER-]: Secondary | ICD-10-CM

## 2017-08-07 DIAGNOSIS — Z923 Personal history of irradiation: Secondary | ICD-10-CM | POA: Diagnosis not present

## 2017-08-07 NOTE — Progress Notes (Signed)
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  Interval Since Last Radiation:  7 weeks   05/08/2017 - 06/21/2017: The patient initially received a dose of 50.4 Gy in 28 fractions to the left chest wall, supraclavicular region, and posterior axillary region. This was delivered using a 3-D conformal, 4 field technique. The patient then received a boost to the mastectomy scar. This delivered an additional 10 Gy in 5 fractions using an en face electron field. The total dose was 60.4 Gy.   Narrative:  The patient returns today for routine follow-up. During treatment she did very well with radiotherapy and did not have significant desquamation.                             On review of systems, the patient states she's doing great and continues with Herceptin. She denies any concerns at this time with her skin. No other complaints were noted.  ALLERGIES:  is allergic to sumatriptan and statins.  Meds: Current Outpatient Medications  Medication Sig Dispense Refill  . FLUoxetine (PROZAC) 20 MG capsule Take 20 mg by mouth every morning.    . lidocaine-prilocaine (EMLA) cream Apply to affected area once 30 g 3  . LORazepam (ATIVAN) 1 MG tablet Take 1 tablet (1 mg total) by mouth daily. (Patient taking differently: Take 1 mg by mouth daily as needed for sleep. ) 30 tablet 2  . Nutritional Supplements (JUICE PLUS FIBRE PO) Take 2 each by mouth daily. [employer]    . Nutritional Supplements (JUICE PLUS FIBRE PO) Take 2 each by mouth daily. [employer]    . rizatriptan (MAXALT) 10 MG tablet Take 10 mg by mouth daily as needed for migraine. May repeat in 2 hours if needed     .  oxyCODONE-acetaminophen (PERCOCET/ROXICET) 5-325 MG tablet Take 1 tablet by mouth every 4 (four) hours as needed for severe pain. (Patient not taking: Reported on 08/07/2017) 30 tablet 0  . zolpidem (AMBIEN) 10 MG tablet Take 10 mg by mouth at bedtime as needed for sleep.     No current facility-administered medications for this encounter.     Physical Findings:  height is 5' 7" (1.702 m) and weight is 157 lb 9.6 oz (71.5 kg). Her oral temperature is 99.1 F (37.3 C). Her blood pressure is 109/70 and her pulse is 75. Her respiration is 18 and oxygen saturation is 97%.  Pain Assessment Pain Score: 0-No pain/10 In general this is a well appearing caucasian female in no acute distress. She's alert and oriented x4 and appropriate throughout the examination. Cardiopulmonary assessment is negative for acute distress and she exhibits normal effort. The left breast was examined and reveals mild hyperpigmentation along her expander. There is mild hyperpigmentation along the supraclavicular and left axilla. No desquamation is noted.  Lab Findings: Lab Results  Component Value Date   WBC 4.0 07/21/2017   HGB 11.5 (L) 07/21/2017   HCT 35.9 07/21/2017   MCV 84.3 07/21/2017   PLT 197 07/21/2017     Radiographic Findings: Dg Foot Complete Left  Result Date: 07/21/2017 CLINICAL DATA:  Left foot pain EXAM: LEFT  FOOT - COMPLETE 3+ VIEW COMPARISON:  None. FINDINGS: There is no evidence of fracture or dislocation. There is no evidence of arthropathy or other focal bone abnormality. Soft tissues are unremarkable. IMPRESSION: No acute abnormality noted. Electronically Signed   By: Inez Catalina M.D.   On: 07/21/2017 09:54    Impression/Plan: 1.  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 The patient has been doing well since completion of radiotherapy. We discussed that we would be happy to continue to follow her as needed, but she will also  continue to follow up with Dr. Lindi Adie in medical oncology while she continues Herceptin infusions. She was counseled on skin care as well as measures to avoid sun exposure to this area.  2. Survivorship. We discussed the importance of survivorship evaluation and she is not yet, but will be scheduled for this in the near future. She was also given the monthly calendar for access to resources offered within the cancer center.     Carola Rhine, PAC

## 2017-08-11 ENCOUNTER — Inpatient Hospital Stay: Payer: 59 | Attending: Hematology and Oncology

## 2017-08-11 VITALS — BP 108/85 | HR 65 | Temp 98.3°F | Resp 16

## 2017-08-11 DIAGNOSIS — Z293 Encounter for prophylactic fluoride administration: Secondary | ICD-10-CM | POA: Insufficient documentation

## 2017-08-11 DIAGNOSIS — C50512 Malignant neoplasm of lower-outer quadrant of left female breast: Secondary | ICD-10-CM | POA: Insufficient documentation

## 2017-08-11 DIAGNOSIS — Z9013 Acquired absence of bilateral breasts and nipples: Secondary | ICD-10-CM | POA: Diagnosis not present

## 2017-08-11 DIAGNOSIS — Z79899 Other long term (current) drug therapy: Secondary | ICD-10-CM | POA: Insufficient documentation

## 2017-08-11 DIAGNOSIS — D649 Anemia, unspecified: Secondary | ICD-10-CM | POA: Insufficient documentation

## 2017-08-11 DIAGNOSIS — L598 Other specified disorders of the skin and subcutaneous tissue related to radiation: Secondary | ICD-10-CM | POA: Insufficient documentation

## 2017-08-11 DIAGNOSIS — Z171 Estrogen receptor negative status [ER-]: Secondary | ICD-10-CM | POA: Diagnosis not present

## 2017-08-11 DIAGNOSIS — Z5112 Encounter for antineoplastic immunotherapy: Secondary | ICD-10-CM | POA: Insufficient documentation

## 2017-08-11 MED ORDER — SODIUM CHLORIDE 0.9 % IV SOLN
Freq: Once | INTRAVENOUS | Status: AC
  Administered 2017-08-11: 09:00:00 via INTRAVENOUS

## 2017-08-11 MED ORDER — SODIUM CHLORIDE 0.9% FLUSH
10.0000 mL | INTRAVENOUS | Status: DC | PRN
  Administered 2017-08-11: 10 mL
  Filled 2017-08-11: qty 10

## 2017-08-11 MED ORDER — DIPHENHYDRAMINE HCL 25 MG PO CAPS
ORAL_CAPSULE | ORAL | Status: AC
  Filled 2017-08-11: qty 1

## 2017-08-11 MED ORDER — ACETAMINOPHEN 325 MG PO TABS
ORAL_TABLET | ORAL | Status: AC
  Filled 2017-08-11: qty 2

## 2017-08-11 MED ORDER — TRASTUZUMAB CHEMO 150 MG IV SOLR
420.0000 mg | Freq: Once | INTRAVENOUS | Status: AC
  Administered 2017-08-11: 420 mg via INTRAVENOUS
  Filled 2017-08-11: qty 20

## 2017-08-11 MED ORDER — PERTUZUMAB CHEMO INJECTION 420 MG/14ML
420.0000 mg | Freq: Once | INTRAVENOUS | Status: AC
  Administered 2017-08-11: 420 mg via INTRAVENOUS
  Filled 2017-08-11: qty 14

## 2017-08-11 MED ORDER — HEPARIN SOD (PORK) LOCK FLUSH 100 UNIT/ML IV SOLN
500.0000 [IU] | Freq: Once | INTRAVENOUS | Status: AC | PRN
  Administered 2017-08-11: 500 [IU]
  Filled 2017-08-11: qty 5

## 2017-08-11 MED ORDER — DIPHENHYDRAMINE HCL 25 MG PO CAPS
25.0000 mg | ORAL_CAPSULE | Freq: Once | ORAL | Status: AC
  Administered 2017-08-11: 25 mg via ORAL

## 2017-08-11 MED ORDER — ACETAMINOPHEN 325 MG PO TABS
650.0000 mg | ORAL_TABLET | Freq: Once | ORAL | Status: AC
  Administered 2017-08-11: 650 mg via ORAL

## 2017-08-17 ENCOUNTER — Encounter: Payer: Self-pay | Admitting: Hematology and Oncology

## 2017-08-18 ENCOUNTER — Telehealth: Payer: Self-pay

## 2017-08-18 NOTE — Telephone Encounter (Signed)
Lorazepam 1 mg 1 tab daily as needed for sleep called in to pt pharmacy.  Ok per [facility].

## 2017-08-23 ENCOUNTER — Ambulatory Visit (HOSPITAL_COMMUNITY)
Admission: RE | Admit: 2017-08-23 | Discharge: 2017-08-23 | Disposition: A | Discharge status: Home / Self Care | Payer: 59 | Source: Ambulatory Visit | Attending: Adult Health | Admitting: Adult Health

## 2017-08-23 DIAGNOSIS — R002 Palpitations: Secondary | ICD-10-CM | POA: Diagnosis not present

## 2017-08-23 DIAGNOSIS — C50512 Malignant neoplasm of lower-outer quadrant of left female breast: Secondary | ICD-10-CM | POA: Diagnosis not present

## 2017-08-23 DIAGNOSIS — Z171 Estrogen receptor negative status [ER-]: Secondary | ICD-10-CM | POA: Diagnosis not present

## 2017-08-23 NOTE — Progress Notes (Signed)
  Echocardiogram 2D Echocardiogram has been performed.  Laura Anthony 08/23/2017, 10:54 AM

## 2017-09-01 ENCOUNTER — Inpatient Hospital Stay (HOSPITAL_BASED_OUTPATIENT_CLINIC_OR_DEPARTMENT_OTHER): Payer: 59 | Admitting: Hematology and Oncology

## 2017-09-01 ENCOUNTER — Telehealth: Payer: Self-pay | Admitting: Hematology and Oncology

## 2017-09-01 ENCOUNTER — Inpatient Hospital Stay: Payer: 59

## 2017-09-01 ENCOUNTER — Inpatient Hospital Stay: Payer: 59 | Attending: Hematology and Oncology

## 2017-09-01 ENCOUNTER — Encounter: Payer: Self-pay | Admitting: *Deleted

## 2017-09-01 VITALS — BP 111/77 | HR 61 | Temp 98.1°F | Resp 18

## 2017-09-01 DIAGNOSIS — Z171 Estrogen receptor negative status [ER-]: Secondary | ICD-10-CM

## 2017-09-01 DIAGNOSIS — Z5112 Encounter for antineoplastic immunotherapy: Secondary | ICD-10-CM | POA: Insufficient documentation

## 2017-09-01 DIAGNOSIS — Z923 Personal history of irradiation: Secondary | ICD-10-CM | POA: Diagnosis not present

## 2017-09-01 DIAGNOSIS — C50512 Malignant neoplasm of lower-outer quadrant of left female breast: Secondary | ICD-10-CM | POA: Insufficient documentation

## 2017-09-01 DIAGNOSIS — Z9013 Acquired absence of bilateral breasts and nipples: Secondary | ICD-10-CM | POA: Insufficient documentation

## 2017-09-01 DIAGNOSIS — Z79899 Other long term (current) drug therapy: Secondary | ICD-10-CM

## 2017-09-01 LAB — CBC WITH DIFFERENTIAL/PLATELET
Basophils Absolute: 0 10*3/uL (ref 0.0–0.1)
Basophils Relative: 1 %
Eosinophils Absolute: 0.1 10*3/uL (ref 0.0–0.5)
Eosinophils Relative: 2 %
HCT: 32.9 % — ABNORMAL LOW (ref 34.8–46.6)
Hemoglobin: 10.9 g/dL — ABNORMAL LOW (ref 11.6–15.9)
Lymphocytes Relative: 23 %
Lymphs Abs: 0.8 10*3/uL — ABNORMAL LOW (ref 0.9–3.3)
MCH: 28.3 pg (ref 25.1–34.0)
MCHC: 33.2 g/dL (ref 31.5–36.0)
MCV: 85.1 fL (ref 79.5–101.0)
Monocytes Absolute: 0.3 10*3/uL (ref 0.1–0.9)
Monocytes Relative: 8 %
Neutro Abs: 2.4 10*3/uL (ref 1.5–6.5)
Neutrophils Relative %: 66 %
Platelets: 214 10*3/uL (ref 145–400)
RBC: 3.86 MIL/uL (ref 3.70–5.45)
RDW: 19 % — ABNORMAL HIGH (ref 11.2–14.5)
WBC: 3.6 10*3/uL — ABNORMAL LOW (ref 3.9–10.3)

## 2017-09-01 LAB — COMPREHENSIVE METABOLIC PANEL
ALT: 12 U/L (ref 0–44)
AST: 18 U/L (ref 15–41)
Albumin: 3.4 g/dL — ABNORMAL LOW (ref 3.5–5.0)
Alkaline Phosphatase: 97 U/L (ref 38–126)
Anion gap: 8 (ref 5–15)
BUN: 15 mg/dL (ref 6–20)
CO2: 27 mmol/L (ref 22–32)
Calcium: 8.7 mg/dL — ABNORMAL LOW (ref 8.9–10.3)
Chloride: 106 mmol/L (ref 98–111)
Creatinine, Ser: 0.82 mg/dL (ref 0.44–1.00)
GFR calc Af Amer: >60 mL/min (ref >60)
GFR calc non Af Amer: >60 mL/min (ref >60)
Glucose, Bld: 78 mg/dL (ref 70–99)
Potassium: 3.7 mmol/L (ref 3.5–5.1)
Sodium: 141 mmol/L (ref 135–145)
Total Bilirubin: 0.3 mg/dL (ref 0.3–1.2)
Total Protein: 7 g/dL (ref 6.5–8.1)

## 2017-09-01 MED ORDER — DIPHENHYDRAMINE HCL 25 MG PO CAPS
ORAL_CAPSULE | ORAL | Status: AC
Start: 2017-09-01 — End: ?
  Filled 2017-09-01: qty 1

## 2017-09-01 MED ORDER — HEPARIN SOD (PORK) LOCK FLUSH 100 UNIT/ML IV SOLN
500.0000 [IU] | Freq: Once | INTRAVENOUS | Status: AC | PRN
  Administered 2017-09-01: 500 [IU]
  Filled 2017-09-01: qty 5

## 2017-09-01 MED ORDER — SODIUM CHLORIDE 0.9 % IV SOLN
420.0000 mg | Freq: Once | INTRAVENOUS | Status: AC
  Administered 2017-09-01: 420 mg via INTRAVENOUS
  Filled 2017-09-01: qty 14

## 2017-09-01 MED ORDER — ACETAMINOPHEN 325 MG PO TABS
ORAL_TABLET | ORAL | Status: AC
  Filled 2017-09-01: qty 2

## 2017-09-01 MED ORDER — SODIUM CHLORIDE 0.9 % IV SOLN
Freq: Once | INTRAVENOUS | Status: AC
  Administered 2017-09-01: 10:00:00 via INTRAVENOUS
  Filled 2017-09-01: qty 250

## 2017-09-01 MED ORDER — ACETAMINOPHEN 325 MG PO TABS
650.0000 mg | ORAL_TABLET | Freq: Once | ORAL | Status: AC
  Administered 2017-09-01: 650 mg via ORAL

## 2017-09-01 MED ORDER — DIPHENHYDRAMINE HCL 25 MG PO CAPS
25.0000 mg | ORAL_CAPSULE | Freq: Once | ORAL | Status: AC
  Administered 2017-09-01: 25 mg via ORAL

## 2017-09-01 MED ORDER — SODIUM CHLORIDE 0.9% FLUSH
10.0000 mL | INTRAVENOUS | Status: DC | PRN
  Administered 2017-09-01: 10 mL
  Filled 2017-09-01: qty 10

## 2017-09-01 MED ORDER — TRASTUZUMAB CHEMO 150 MG IV SOLR
420.0000 mg | Freq: Once | INTRAVENOUS | Status: AC
  Administered 2017-09-01: 420 mg via INTRAVENOUS
  Filled 2017-09-01: qty 20

## 2017-09-01 NOTE — Patient Instructions (Signed)
[facility] Discharge Instructions for Patients Receiving Chemotherapy  Today you received the following chemotherapy agents: Herceptin and Perjeta.  To help prevent nausea and vomiting after your treatment, we encourage you to take your nausea medication as prescribed.   If you develop nausea and vomiting that is not controlled by your nausea medication, call the clinic.   BELOW ARE SYMPTOMS THAT SHOULD BE REPORTED IMMEDIATELY:  *FEVER GREATER THAN 100.5 F  *CHILLS WITH OR WITHOUT FEVER  NAUSEA AND VOMITING THAT IS NOT CONTROLLED WITH YOUR NAUSEA MEDICATION  *UNUSUAL SHORTNESS OF BREATH  *UNUSUAL BRUISING OR BLEEDING  TENDERNESS IN MOUTH AND THROAT WITH OR WITHOUT PRESENCE OF ULCERS  *URINARY PROBLEMS  *BOWEL PROBLEMS  UNUSUAL RASH Items with * indicate a potential emergency and should be followed up as soon as possible.  Feel free to call the clinic should you have any questions or concerns. The clinic phone number is [phone].  Please show the CHEMO ALERT CARD at check-in to the Emergency Department and triage nurse.   

## 2017-09-01 NOTE — Patient Instructions (Signed)
Implanted Port Home Guide An implanted port is a type of central line that is placed under the skin. Central lines are used to provide IV access when treatment or nutrition needs to be given through a person's veins. Implanted ports are used for long-term IV access. An implanted port may be placed because:  You need IV medicine that would be irritating to the small veins in your hands or arms.  You need long-term IV medicines, such as antibiotics.  You need IV nutrition for a long period.  You need frequent blood draws for lab tests.  You need dialysis.  Implanted ports are usually placed in the chest area, but they can also be placed in the upper arm, the abdomen, or the leg. An implanted port has two main parts:  Reservoir. The reservoir is round and will appear as a small, raised area under your skin. The reservoir is the part where a needle is inserted to give medicines or draw blood.  Catheter. The catheter is a thin, flexible tube that extends from the reservoir. The catheter is placed into a large vein. Medicine that is inserted into the reservoir goes into the catheter and then into the vein.  How will I care for my incision site? Do not get the incision site wet. Bathe or shower as directed by your health care provider. How is my port accessed? Special steps must be taken to access the port:  Before the port is accessed, a numbing cream can be placed on the skin. This helps numb the skin over the port site.  Your health care provider uses a sterile technique to access the port. ? Your health care provider must put on a mask and sterile gloves. ? The skin over your port is cleaned carefully with an antiseptic and allowed to dry. ? The port is gently pinched between sterile gloves, and a needle is inserted into the port.  Only "non-coring" port needles should be used to access the port. Once the port is accessed, a blood return should be checked. This helps ensure that the port  is in the vein and is not clogged.  If your port needs to remain accessed for a constant infusion, a clear (transparent) bandage will be placed over the needle site. The bandage and needle will need to be changed every week, or as directed by your health care provider.  Keep the bandage covering the needle clean and dry. Do not get it wet. Follow your health care provider's instructions on how to take a shower or bath while the port is accessed.  If your port does not need to stay accessed, no bandage is needed over the port.  What is flushing? Flushing helps keep the port from getting clogged. Follow your health care provider's instructions on how and when to flush the port. Ports are usually flushed with saline solution or a medicine called heparin. The need for flushing will depend on how the port is used.  If the port is used for intermittent medicines or blood draws, the port will need to be flushed: ? After medicines have been given. ? After blood has been drawn. ? As part of routine maintenance.  If a constant infusion is running, the port may not need to be flushed.  How long will my port stay implanted? The port can stay in for as long as your health care provider thinks it is needed. When it is time for the port to come out, surgery will be   done to remove it. The procedure is similar to the one performed when the port was put in. When should I seek immediate medical care? When you have an implanted port, you should seek immediate medical care if:  You notice a bad smell coming from the incision site.  You have swelling, redness, or drainage at the incision site.  You have more swelling or pain at the port site or the surrounding area.  You have a fever that is not controlled with medicine.  This information is not intended to replace advice given to you by your health care provider. Make sure you discuss any questions you have with your health care provider. Document  Released: 01/17/2005 Document Revised: 06/25/2015 Document Reviewed: 09/24/2012 Elsevier Interactive Patient Education  2017 [employer]  

## 2017-09-01 NOTE — Assessment & Plan Note (Signed)
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 Neoadjuvant chemotherapy with TCHP completed 02/17/2017 this would be followed by Herceptinand Perjetamaintenance for 1 year. 2.Bilateral mastectomies 03/28/2016:Bilateral mastectomies: Left mastectomy: IDC grade 2 0.9 cm, nodes negative, right mastectomy benign, ER 0%, PR 0%, HER-2 positive ratio 2.6 3. Adjuvant radiation 05/08/2017 to 06/09/2017 ---------------------------------------------------------------------------- Current treatment:Herceptin Perjeta maintenance to be completed in September 2019 Patient underwent breast reconstruction with expanders.  Herceptin Perjeta toxicities: Patient is tolerating it very well.  Severe anemia: Monitoring closely. Severe radiation dermatitis: Resolved  Return to clinic every 3 weeks for Herceptin and Perjeta every 6 weeks for follow-up with me

## 2017-09-01 NOTE — Progress Notes (Signed)
Patient Care Team: [name], MD as PCP - General (Family Medicine)  DIAGNOSIS:  Encounter Diagnosis  Name Primary?  . Malignant neoplasm of lower-outer quadrant of left breast of female, estrogen receptor negative ([place])     SUMMARY OF ONCOLOGIC HISTORY:   Malignant neoplasm of lower-outer quadrant of left breast of female, estrogen receptor negative ([place])   10/20/2016 Mammogram    Mammogram and ultrasound of the left breast revealed 1.7 cm mass at 4:00 position, 6:30 position 5 x 4 x 4 mm mass, 6:00 position 5 cm nipple 7 x 6 x 11 mm, left axillary lymph node with thickened cortex, T1c N1 stage II a AJCC 8      10/24/2016 Initial Diagnosis    Left breast biopsy 6:30 position 3 cm from nipple: IDC grade 2, DCIS, ER 0%, PR 0%, Ki-67 15% HER-2 positive ratio 2.1; 4:00 position 3 cm from nipple: IDC grade 2, DCIS, ER 0%, PR 0%, Ki-67 35%, HER-2 positive ratio 2.02; left axillary lymph node biopsy positive      11/04/2016 - 02/17/2017 Neo-Adjuvant Chemotherapy    TCH Perjeta 6 cycles followed by Herceptin + Perjeta maintenance to be completed September 2019      11/30/2016 Genetic Testing    Negative genetic testing on the common hereditary cancer panel.  The Hereditary Gene Panel offered by [facility] includes sequencing and/or deletion duplication testing of the following 47 genes: APC, ATM, AXIN2, BARD1, BMPR1A, BRCA1, BRCA2, BRIP1, CDH1, CDK4, CDKN2A (p14ARF), CDKN2A (p16INK4a), CHEK2, CTNNA1, DICER1, EPCAM (Deletion/duplication testing only), GREM1 (promoter region deletion/duplication testing only), KIT, MEN1, MLH1, MSH2, MSH3, MSH6, MUTYH, NBN, NF1, NHTL1, PALB2, PDGFRA, PMS2, POLD1, POLE, PTEN, RAD50, RAD51C, RAD51D, SDHB, SDHC, SDHD, SMAD4, SMARCA4. STK11, TP53, TSC1, TSC2, and VHL.  The following genes were evaluated for sequence changes only: SDHA and HOXB13 c.251G>A variant only. The report date is November 30, 2016.       03/27/2017 Surgery    Bilateral mastectomies: Left  mastectomy: IDC grade 2 0.9 cm, nodes negative, right mastectomy benign, ER 0%, PR 0%, HER-2 positive ratio 2.6      05/08/2017 - 06/09/2017 Radiation Therapy    Adjuvant radiation therapy       CHIEF COMPLIANT: Follow-up on Herceptin and Perjeta  INTERVAL HISTORY: Laura Anthony is a 42 year old with above-mentioned history of bilateral mastectomies for left breast cancer is currently on Herceptin and Perjeta maintenance and appears to be tolerating extremely well.  She is got some achiness in her joints.  She is complaining of the left clavicular and shoulder pain for the past month it is not been getting any better.  REVIEW OF SYSTEMS:   Constitutional: Denies fevers, chills or abnormal weight loss Eyes: Denies blurriness of vision Ears, nose, mouth, throat, and face: Denies mucositis or sore throat Respiratory: Denies cough, dyspnea or wheezes Cardiovascular: Denies palpitation, chest discomfort Gastrointestinal:  Denies nausea, heartburn or change in bowel habits Skin: Denies abnormal skin rashes Lymphatics: Denies new lymphadenopathy or easy bruising Neurological:Denies numbness, tingling or new weaknesses Behavioral/Psych: Mood is stable, no new changes  Extremities: No lower extremity edema Breast: Radiation dermatitis has improved All other systems were reviewed with the patient and are negative.  I have reviewed the past medical history, past surgical history, social history and family history with the patient and they are unchanged from previous note.  ALLERGIES:  is allergic to sumatriptan and statins.  MEDICATIONS:  Current Outpatient Medications  Medication Sig Dispense Refill  . FLUoxetine (PROZAC) 20  MG capsule Take 20 mg by mouth every morning.    . lidocaine-prilocaine (EMLA) cream Apply to affected area once 30 g 3  . LORazepam (ATIVAN) 1 MG tablet Take 1 tablet (1 mg total) by mouth daily. (Patient taking differently: Take 1 mg by mouth daily as needed  for sleep. ) 30 tablet 2  . Nutritional Supplements (JUICE PLUS FIBRE PO) Take 2 each by mouth daily. [employer]    . Nutritional Supplements (JUICE PLUS FIBRE PO) Take 2 each by mouth daily. [employer]    . oxyCODONE-acetaminophen (PERCOCET/ROXICET) 5-325 MG tablet Take 1 tablet by mouth every 4 (four) hours as needed for severe pain. (Patient not taking: Reported on 08/07/2017) 30 tablet 0  . rizatriptan (MAXALT) 10 MG tablet Take 10 mg by mouth daily as needed for migraine. May repeat in 2 hours if needed     . zolpidem (AMBIEN) 10 MG tablet Take 10 mg by mouth at bedtime as needed for sleep.     No current facility-administered medications for this visit.     PHYSICAL EXAMINATION: ECOG PERFORMANCE STATUS: 1 - Symptomatic but completely ambulatory  Vitals:   09/01/17 0919  BP: 115/77  Pulse: 68  Resp: 18  Temp: 98.8 F (37.1 C)  SpO2: 99%   Filed Weights   09/01/17 0919  Weight: 156 lb 9.6 oz (71 kg)    GENERAL:alert, no distress and comfortable SKIN: skin color, texture, turgor are normal, no rashes or significant lesions EYES: normal, Conjunctiva are pink and non-injected, sclera clear OROPHARYNX:no exudate, no erythema and lips, buccal mucosa, and tongue normal  NECK: supple, thyroid normal size, non-tender, without nodularity LYMPH:  no palpable lymphadenopathy in the cervical, axillary or inguinal LUNGS: clear to auscultation and percussion with normal breathing effort HEART: regular rate & rhythm and no murmurs and no lower extremity edema ABDOMEN:abdomen soft, non-tender and normal bowel sounds MUSCULOSKELETAL:no cyanosis of digits and no clubbing  NEURO: alert & oriented x 3 with fluent speech, no focal motor/sensory deficits EXTREMITIES: No lower extremity edema   LABORATORY DATA:  I have reviewed the data as listed CMP Latest Ref Rng & Units 07/21/2017 06/09/2017 05/19/2017  Glucose 70 - 140 mg/dL 99 96 80  BUN 7 - 26 mg/dL 14 15 18   Creatinine 0.60 - 1.10  mg/dL 0.97 0.81 0.82  Sodium 136 - 145 mmol/L 139 140 141  Potassium 3.5 - 5.1 mmol/L 3.9 3.8 3.9  Chloride 98 - 109 mmol/L 104 105 107  CO2 22 - 29 mmol/L 27 28 27   Calcium 8.4 - 10.4 mg/dL 9.6 9.3 9.3  Total Protein 6.4 - 8.3 g/dL 7.4 7.4 7.0  Total Bilirubin 0.2 - 1.2 mg/dL 0.3 <0.2(L) 0.2  Alkaline Phos 40 - 150 U/L 110 95 85  AST 5 - 34 U/L 19 18 18   ALT 0 - 55 U/L 12 15 17     Lab Results  Component Value Date   WBC 3.6 (L) 09/01/2017   HGB 10.9 (L) 09/01/2017   HCT 32.9 (L) 09/01/2017   MCV 85.1 09/01/2017   PLT 214 09/01/2017   NEUTROABS 2.4 09/01/2017    ASSESSMENT & PLAN:  Malignant neoplasm of lower-outer quadrant of left breast of female, estrogen receptor negative ([facility]) Mammogram and ultrasound of the left breast revealed 1.7 cm mass at 4:00 position, 6:30 position 5 x 4 x 4 mm mass, 6:00 position 5 cm nipple 7 x 6 x 11 mm, left axillary lymph node with thickened cortex, T1c N1 stage  II a AJCC 8  10/24/2016: Left breast biopsy 6:30 position 3 cm from nipple: IDC grade 2, DCIS, ER 0%, PR 0%, 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 Neoadjuvant chemotherapy with TCHP completed 02/17/2017 this would be followed by Herceptinand Perjetamaintenance for 1 year. 2.Bilateral mastectomies 03/28/2016:Bilateral mastectomies: Left mastectomy: IDC grade 2 0.9 cm, nodes negative, right mastectomy benign, ER 0%, PR 0%, HER-2 positive ratio 2.6 3. Adjuvant radiation 05/08/2017 to 06/09/2017 ---------------------------------------------------------------------------- Current treatment:Herceptin Perjeta maintenance to be completed in September 2019 Patient underwent breast reconstruction with expanders.  Herceptin Perjeta toxicities: Patient is tolerating it very well.  Severe anemia: Monitoring closely. Severe radiation  dermatitis: Resolved Severe pain in the left shoulder and clavicle: We will obtain a bone scan to evaluate her bones better.  She is got bone pains in different parts of her body. .  She has 2 more Herceptin Perjeta as after today. Return to clinic every 3 weeks for Herceptin and Perjeta every 6 weeks for follow-up with me     No orders of the defined types were placed in this encounter.  The patient has a good understanding of the overall plan. she agrees with it. she will call with any problems that may develop before the next visit here.   Harriette Ohara, MD 09/01/17

## 2017-09-01 NOTE — Telephone Encounter (Signed)
Gave patient avs and calendar of upcoming appts. Patient scheduled in spet due to avail of providers appts.

## 2017-09-01 NOTE — Progress Notes (Signed)
She did not want to stay for 30 minutes after Perjeta, vital signs obtained.

## 2017-09-08 ENCOUNTER — Ambulatory Visit (HOSPITAL_COMMUNITY): Payer: 59 | Admitting: Cardiology

## 2017-09-11 ENCOUNTER — Encounter (HOSPITAL_COMMUNITY)
Admission: RE | Admit: 2017-09-11 | Discharge: 2017-09-11 | Disposition: A | Discharge status: Home / Self Care | Payer: 59 | Source: Ambulatory Visit | Attending: Hematology and Oncology | Admitting: Hematology and Oncology

## 2017-09-11 DIAGNOSIS — C50512 Malignant neoplasm of lower-outer quadrant of left female breast: Secondary | ICD-10-CM | POA: Diagnosis not present

## 2017-09-11 DIAGNOSIS — Z171 Estrogen receptor negative status [ER-]: Secondary | ICD-10-CM | POA: Diagnosis not present

## 2017-09-11 DIAGNOSIS — C50919 Malignant neoplasm of unspecified site of unspecified female breast: Secondary | ICD-10-CM | POA: Diagnosis not present

## 2017-09-11 MED ORDER — TECHNETIUM TC 99M MEDRONATE IV KIT
21.4000 | PACK | Freq: Once | INTRAVENOUS | Status: AC | PRN
  Administered 2017-09-11: 21.4 via INTRAVENOUS

## 2017-09-12 ENCOUNTER — Telehealth: Payer: Self-pay | Admitting: Hematology and Oncology

## 2017-09-12 NOTE — Telephone Encounter (Signed)
I informed the patient that the bone scan was normal. Instructed her that she can take Naprosyn or ibuprofen for anti-inflammatory medication.

## 2017-09-22 ENCOUNTER — Inpatient Hospital Stay: Payer: 59

## 2017-09-22 VITALS — BP 109/75 | HR 65 | Temp 98.6°F | Resp 18

## 2017-09-22 DIAGNOSIS — Z171 Estrogen receptor negative status [ER-]: Secondary | ICD-10-CM

## 2017-09-22 DIAGNOSIS — C50512 Malignant neoplasm of lower-outer quadrant of left female breast: Secondary | ICD-10-CM

## 2017-09-22 MED ORDER — HEPARIN SOD (PORK) LOCK FLUSH 100 UNIT/ML IV SOLN
500.0000 [IU] | Freq: Once | INTRAVENOUS | Status: AC | PRN
  Administered 2017-09-22: 500 [IU]
  Filled 2017-09-22: qty 5

## 2017-09-22 MED ORDER — SODIUM CHLORIDE 0.9% FLUSH
10.0000 mL | INTRAVENOUS | Status: DC | PRN
  Administered 2017-09-22: 10 mL
  Filled 2017-09-22: qty 10

## 2017-09-22 MED ORDER — SODIUM CHLORIDE 0.9 % IV SOLN
Freq: Once | INTRAVENOUS | Status: AC
  Administered 2017-09-22: 09:00:00 via INTRAVENOUS
  Filled 2017-09-22: qty 250

## 2017-09-22 MED ORDER — DIPHENHYDRAMINE HCL 25 MG PO CAPS
25.0000 mg | ORAL_CAPSULE | Freq: Once | ORAL | Status: AC
  Administered 2017-09-22: 25 mg via ORAL

## 2017-09-22 MED ORDER — TRASTUZUMAB CHEMO 150 MG IV SOLR
420.0000 mg | Freq: Once | INTRAVENOUS | Status: AC
  Administered 2017-09-22: 420 mg via INTRAVENOUS
  Filled 2017-09-22: qty 20

## 2017-09-22 MED ORDER — DIPHENHYDRAMINE HCL 25 MG PO CAPS
ORAL_CAPSULE | ORAL | Status: AC
  Filled 2017-09-22: qty 1

## 2017-09-22 MED ORDER — SODIUM CHLORIDE 0.9 % IV SOLN
420.0000 mg | Freq: Once | INTRAVENOUS | Status: AC
  Administered 2017-09-22: 420 mg via INTRAVENOUS
  Filled 2017-09-22: qty 14

## 2017-09-22 MED ORDER — ACETAMINOPHEN 325 MG PO TABS
650.0000 mg | ORAL_TABLET | Freq: Once | ORAL | Status: AC
  Administered 2017-09-22: 650 mg via ORAL

## 2017-09-22 MED ORDER — ACETAMINOPHEN 325 MG PO TABS
ORAL_TABLET | ORAL | Status: AC
  Filled 2017-09-22: qty 2

## 2017-09-22 NOTE — Patient Instructions (Signed)
[facility] Discharge Instructions for Patients Receiving Chemotherapy  Today you received the following chemotherapy agents Herceptin and Perjeta.   To help prevent nausea and vomiting after your treatment, we encourage you to take your nausea medication as directed.   If you develop nausea and vomiting that is not controlled by your nausea medication, call the clinic.   BELOW ARE SYMPTOMS THAT SHOULD BE REPORTED IMMEDIATELY:  *FEVER GREATER THAN 100.5 F  *CHILLS WITH OR WITHOUT FEVER  NAUSEA AND VOMITING THAT IS NOT CONTROLLED WITH YOUR NAUSEA MEDICATION  *UNUSUAL SHORTNESS OF BREATH  *UNUSUAL BRUISING OR BLEEDING  TENDERNESS IN MOUTH AND THROAT WITH OR WITHOUT PRESENCE OF ULCERS  *URINARY PROBLEMS  *BOWEL PROBLEMS  UNUSUAL RASH Items with * indicate a potential emergency and should be followed up as soon as possible.  Feel free to call the clinic should you have any questions or concerns. The clinic phone number is [phone].  Please show the CHEMO ALERT CARD at check-in to the Emergency Department and triage nurse.   

## 2017-10-12 ENCOUNTER — Telehealth: Payer: Self-pay | Admitting: Hematology and Oncology

## 2017-10-12 ENCOUNTER — Inpatient Hospital Stay (HOSPITAL_BASED_OUTPATIENT_CLINIC_OR_DEPARTMENT_OTHER): Payer: 59 | Admitting: Hematology and Oncology

## 2017-10-12 ENCOUNTER — Telehealth: Payer: Self-pay | Admitting: Pharmacist

## 2017-10-12 ENCOUNTER — Inpatient Hospital Stay: Payer: 59

## 2017-10-12 ENCOUNTER — Encounter: Payer: Self-pay | Admitting: *Deleted

## 2017-10-12 ENCOUNTER — Inpatient Hospital Stay: Payer: 59 | Attending: Hematology and Oncology

## 2017-10-12 DIAGNOSIS — Z171 Estrogen receptor negative status [ER-]: Secondary | ICD-10-CM

## 2017-10-12 DIAGNOSIS — Z79899 Other long term (current) drug therapy: Secondary | ICD-10-CM

## 2017-10-12 DIAGNOSIS — C773 Secondary and unspecified malignant neoplasm of axilla and upper limb lymph nodes: Secondary | ICD-10-CM | POA: Insufficient documentation

## 2017-10-12 DIAGNOSIS — Z9221 Personal history of antineoplastic chemotherapy: Secondary | ICD-10-CM | POA: Diagnosis not present

## 2017-10-12 DIAGNOSIS — Z9013 Acquired absence of bilateral breasts and nipples: Secondary | ICD-10-CM | POA: Insufficient documentation

## 2017-10-12 DIAGNOSIS — Z5112 Encounter for antineoplastic immunotherapy: Secondary | ICD-10-CM | POA: Insufficient documentation

## 2017-10-12 DIAGNOSIS — C50512 Malignant neoplasm of lower-outer quadrant of left female breast: Secondary | ICD-10-CM

## 2017-10-12 DIAGNOSIS — D6481 Anemia due to antineoplastic chemotherapy: Secondary | ICD-10-CM | POA: Insufficient documentation

## 2017-10-12 DIAGNOSIS — Z923 Personal history of irradiation: Secondary | ICD-10-CM | POA: Diagnosis not present

## 2017-10-12 DIAGNOSIS — Z95828 Presence of other vascular implants and grafts: Secondary | ICD-10-CM

## 2017-10-12 LAB — COMPREHENSIVE METABOLIC PANEL
ALT: 11 U/L (ref 0–44)
AST: 16 U/L (ref 15–41)
Albumin: 3.5 g/dL (ref 3.5–5.0)
Alkaline Phosphatase: 103 U/L (ref 38–126)
Anion gap: 7 (ref 5–15)
BUN: 18 mg/dL (ref 6–20)
CO2: 26 mmol/L (ref 22–32)
Calcium: 9 mg/dL (ref 8.9–10.3)
Chloride: 106 mmol/L (ref 98–111)
Creatinine, Ser: 0.87 mg/dL (ref 0.44–1.00)
GFR calc Af Amer: >60 mL/min (ref >60)
GFR calc non Af Amer: >60 mL/min (ref >60)
Glucose, Bld: 96 mg/dL (ref 70–99)
Potassium: 4.1 mmol/L (ref 3.5–5.1)
Sodium: 139 mmol/L (ref 135–145)
Total Bilirubin: 0.4 mg/dL (ref 0.3–1.2)
Total Protein: 7.2 g/dL (ref 6.5–8.1)

## 2017-10-12 LAB — CBC WITH DIFFERENTIAL/PLATELET
Basophils Absolute: 0 10*3/uL (ref 0.0–0.1)
Basophils Relative: 1 %
Eosinophils Absolute: 0.1 10*3/uL (ref 0.0–0.5)
Eosinophils Relative: 1 %
HCT: 34.5 % — ABNORMAL LOW (ref 34.8–46.6)
Hemoglobin: 11.3 g/dL — ABNORMAL LOW (ref 11.6–15.9)
Lymphocytes Relative: 23 %
Lymphs Abs: 1.2 10*3/uL (ref 0.9–3.3)
MCH: 29 pg (ref 25.1–34.0)
MCHC: 32.8 g/dL (ref 31.5–36.0)
MCV: 88.5 fL (ref 79.5–101.0)
Monocytes Absolute: 0.4 10*3/uL (ref 0.1–0.9)
Monocytes Relative: 7 %
Neutro Abs: 3.5 10*3/uL (ref 1.5–6.5)
Neutrophils Relative %: 68 %
Platelets: 198 10*3/uL (ref 145–400)
RBC: 3.9 MIL/uL (ref 3.70–5.45)
RDW: 15.1 % — ABNORMAL HIGH (ref 11.2–14.5)
WBC: 5.1 10*3/uL (ref 3.9–10.3)

## 2017-10-12 MED ORDER — SODIUM CHLORIDE 0.9% FLUSH
10.0000 mL | INTRAVENOUS | Status: DC | PRN
  Administered 2017-10-12: 10 mL
  Filled 2017-10-12: qty 10

## 2017-10-12 MED ORDER — TRASTUZUMAB CHEMO 150 MG IV SOLR
420.0000 mg | Freq: Once | INTRAVENOUS | Status: AC
  Administered 2017-10-12: 420 mg via INTRAVENOUS
  Filled 2017-10-12: qty 20

## 2017-10-12 MED ORDER — NERATINIB MALEATE 40 MG PO TABS: 240.0000 mg | ORAL_TABLET | Freq: Every day | ORAL | 3 refills | Status: DC

## 2017-10-12 MED ORDER — SODIUM CHLORIDE 0.9% FLUSH
10.0000 mL | INTRAVENOUS | Status: DC | PRN
  Administered 2017-10-12: 10 mL via INTRAVENOUS
  Filled 2017-10-12: qty 10

## 2017-10-12 MED ORDER — SODIUM CHLORIDE 0.9 % IV SOLN
Freq: Once | INTRAVENOUS | Status: AC
  Administered 2017-10-12: 13:00:00 via INTRAVENOUS
  Filled 2017-10-12: qty 250

## 2017-10-12 MED ORDER — ACETAMINOPHEN 325 MG PO TABS
650.0000 mg | ORAL_TABLET | Freq: Once | ORAL | Status: AC
  Administered 2017-10-12: 650 mg via ORAL

## 2017-10-12 MED ORDER — DIPHENHYDRAMINE HCL 25 MG PO CAPS
25.0000 mg | ORAL_CAPSULE | Freq: Once | ORAL | Status: AC
  Administered 2017-10-12: 25 mg via ORAL

## 2017-10-12 MED ORDER — SODIUM CHLORIDE 0.9 % IV SOLN
420.0000 mg | Freq: Once | INTRAVENOUS | Status: AC
  Administered 2017-10-12: 420 mg via INTRAVENOUS
  Filled 2017-10-12: qty 14

## 2017-10-12 MED ORDER — HEPARIN SOD (PORK) LOCK FLUSH 100 UNIT/ML IV SOLN
500.0000 [IU] | Freq: Once | INTRAVENOUS | Status: AC | PRN
  Administered 2017-10-12: 500 [IU]
  Filled 2017-10-12: qty 5

## 2017-10-12 MED ORDER — DIPHENHYDRAMINE HCL 25 MG PO CAPS
ORAL_CAPSULE | ORAL | Status: AC
  Filled 2017-10-12: qty 1

## 2017-10-12 MED ORDER — ACETAMINOPHEN 325 MG PO TABS
ORAL_TABLET | ORAL | Status: AC
  Filled 2017-10-12: qty 2

## 2017-10-12 NOTE — Telephone Encounter (Signed)
Oral Oncology Pharmacist Encounter  Received new prescription for Nerlynx (neratinib) for the treatment of hormone-receptor negative, Her-2 positive, stage IIa breast cancer, planned duration 1 year of Nerlynx.  Patient received neo-adjuvant chemotherapy with docetaxel, carboplatin, trastuzumb, and pertuzumab x 6 cycles Oct 2018-Jan 2019 Patient underwent bilateral mastectomies on 03/27/17, received adjuvant radiation Apr-May 2019, and completed 6 months of adjuvant trastuzumab and pertuzumab on 10/12/2017  Will plan to start Nerlynx in 3 weeks (10/3/019), which would have been the timing for next dose of Her-2 directed antibodies.  Labs from 10/12/17 assessed, OK for treatment.  Current medication list in Epic reviewed, no DDIs with Nerlynx identified.  Nerlynx prescription will be sent to appropriate specialty pharmacy for dispensing once insurance authorization has been obtained as Nerlynx is a limited distribution medication.  Oral Oncology Clinic will continue to follow for insurance authorization, copayment issues, initial counseling and start date.  Johny Drilling, PharmD, BCPS, BCOP  10/12/2017 1:54 PM Oral Oncology Clinic [phone]

## 2017-10-12 NOTE — Telephone Encounter (Signed)
Oral Chemotherapy Pharmacist Encounter   I spoke with patient and family in infusion room for overview of: Nerlynx (neratinib).  Last Herceptin/Perjeta infusion: 10/12/17   Counseled patient on administration, dosing, side effects, monitoring, drug-food interactions, safe handling, storage, and disposal.  Prescribing information for Nerlynx recommends dose initiation at full dose, 6 tablets (240 mg) once daily, with the use of scheduled loperamide antidiarrheal prophylaxis for the first 8 weeks of therapy.  Nerlynx will be initiated on a dose titration schedule. Patient will initiatiate antidiarrheal prophylaxis with loperamide: Week 1-2: loperamide 4mg  TID Weeks 3-8: loperamide 4mg  BID Weeks 9-completion of therapy: loperamide as needed Patient instructed to titrate loperamide dosing to achieve 1-2 bowel movements per day  Month 1: Patient will take Nerlynx 40mg  tablets, 4 tablets (160mg ) by mouth once daily with food Month 2: Patient will take Nerlynx 40mg  tablets, 5 tablets (200mg ) by mouth once daily with food Month 3: Patient will take Nerlynx 40mg  tablets, 6 tablets (240mg ) by mouth once daily with food. 240 mg once daily is the target dose of Nerlynx.  Patient knows to avoid grapefruit and grapefruit juice while on treatment with Nerlynx.  Patient instructed to avoid use of PPIs or H2RAs while on treatment with Nerlynx, can separate antacids from Nerlynx by 3 hours if acid suppression is needed.  Nerlynx start date: 11/02/2017  Adverse effects include but are not limited to: diarrhea, nausea, vomiting, mouth sores, fatigue, rash, and abdominal pain.    Reviewed with patient importance of keeping a medication schedule and plan for any missed doses.  Mrs. Lewers voiced understanding and appreciation.   All questions answered. Medication reconciliation performed and medication/allergy list updated.  Nerlynx prescription has been e-scribed to [facility] per  insurance requirement.  Patient informed she is eligible for manufacturer copayment coupon to reduce out-of-pocket expenses for Nerlynx to $0 if her copayment is anything above $0 when the pharmacy contacts her to schedule shipment of Nerlynx.  Patient knows to call the office with questions or concerns. Oral Oncology Clinic will continue to follow.  Johny Drilling, PharmD, BCPS, BCOP  10/12/2017   3:13 PM Oral Oncology Clinic [phone]

## 2017-10-12 NOTE — Assessment & Plan Note (Signed)
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 Neoadjuvant chemotherapy with TCHP completed 02/17/2017 this would be followed by Herceptinand Perjetamaintenance for 1 year. 2.Bilateral mastectomies 03/28/2016:Bilateral mastectomies: Left mastectomy: IDC grade 2 0.9 cm, nodes negative, right mastectomy benign, ER 0%, PR 0%, HER-2 positive ratio 2.6 3. Adjuvant radiation4/08/2017 to 06/09/2017 ---------------------------------------------------------------------------- Current treatment:Herceptin Perjeta maintenance to be completed in September 2019 Patient underwent breast reconstruction with expanders.  Herceptin Perjeta toxicities: Patient is tolerating it very well.  Today's the last dosage of Herceptin and Perjeta.  Severe anemia: Monitoring closely. Severe radiation dermatitis: Resolved  Severe pain in the left shoulder and clavicle: Bone scan 09/11/2017: No evidence of metastatic disease.  I discussed the role of adjuvant neratinib in patients who were completed Herceptin maintenance therapy for HER-2 positive breast cancer.  I briefly discussed the data which revealed approximately 2% absolute difference in survival and decrease in brain metastases.  However it does come with the complication of diarrhea.

## 2017-10-12 NOTE — Progress Notes (Signed)
Patient Care Team: [name], MD as PCP - General (Family Medicine)  DIAGNOSIS:  Encounter Diagnosis  Name Primary?  . Malignant neoplasm of lower-outer quadrant of left breast of female, estrogen receptor negative ([place])     SUMMARY OF ONCOLOGIC HISTORY:   Malignant neoplasm of lower-outer quadrant of left breast of female, estrogen receptor negative ([place])   10/20/2016 Mammogram    Mammogram and ultrasound of the left breast revealed 1.7 cm mass at 4:00 position, 6:30 position 5 x 4 x 4 mm mass, 6:00 position 5 cm nipple 7 x 6 x 11 mm, left axillary lymph node with thickened cortex, T1c N1 stage II a AJCC 8    10/24/2016 Initial Diagnosis    Left breast biopsy 6:30 position 3 cm from nipple: IDC grade 2, DCIS, ER 0%, PR 0%, Ki-67 15% HER-2 positive ratio 2.1; 4:00 position 3 cm from nipple: IDC grade 2, DCIS, ER 0%, PR 0%, Ki-67 35%, HER-2 positive ratio 2.02; left axillary lymph node biopsy positive    11/04/2016 - 02/17/2017 Neo-Adjuvant Chemotherapy    TCH Perjeta 6 cycles followed by Herceptin + Perjeta maintenance to be completed September 2019    11/30/2016 Genetic Testing    Negative genetic testing on the common hereditary cancer panel.  The Hereditary Gene Panel offered by [facility] includes sequencing and/or deletion duplication testing of the following 47 genes: APC, ATM, AXIN2, BARD1, BMPR1A, BRCA1, BRCA2, BRIP1, CDH1, CDK4, CDKN2A (p14ARF), CDKN2A (p16INK4a), CHEK2, CTNNA1, DICER1, EPCAM (Deletion/duplication testing only), GREM1 (promoter region deletion/duplication testing only), KIT, MEN1, MLH1, MSH2, MSH3, MSH6, MUTYH, NBN, NF1, NHTL1, PALB2, PDGFRA, PMS2, POLD1, POLE, PTEN, RAD50, RAD51C, RAD51D, SDHB, SDHC, SDHD, SMAD4, SMARCA4. STK11, TP53, TSC1, TSC2, and VHL.  The following genes were evaluated for sequence changes only: SDHA and HOXB13 c.251G>A variant only. The report date is November 30, 2016.     03/27/2017 Surgery    Bilateral mastectomies: Left mastectomy:  IDC grade 2 0.9 cm, nodes negative, right mastectomy benign, ER 0%, PR 0%, HER-2 positive ratio 2.6    05/08/2017 - 06/09/2017 Radiation Therapy    Adjuvant radiation therapy     CHIEF COMPLIANT: Last cycle of Herceptin and Perjeta  INTERVAL HISTORY: Laura Anthony is a 42 year old with above-mentioned history of bilateral mastectomies after completing neoadjuvant chemotherapy and radiation.  She is currently on Herceptin Perjeta maintenance and today's last treatment.  She has tolerated this treatment extremely well.  She is anxious to finish with the treatment and get the port out.  She is scheduled to undergo breast reconstruction surgery with Dr. Iran Planas.  REVIEW OF SYSTEMS:   Constitutional: Denies fevers, chills or abnormal weight loss Eyes: Denies blurriness of vision Ears, nose, mouth, throat, and face: Denies mucositis or sore throat Respiratory: Denies cough, dyspnea or wheezes Cardiovascular: Denies palpitation, chest discomfort Gastrointestinal:  Denies nausea, heartburn or change in bowel habits Skin: Denies abnormal skin rashes Lymphatics: Denies new lymphadenopathy or easy bruising Neurological:Denies numbness, tingling or new weaknesses Behavioral/Psych: Mood is stable, no new changes  Extremities: No lower extremity edema   All other systems were reviewed with the patient and are negative.  I have reviewed the past medical history, past surgical history, social history and family history with the patient and they are unchanged from previous note.  ALLERGIES:  is allergic to sumatriptan and statins.  MEDICATIONS:  Current Outpatient Medications  Medication Sig Dispense Refill  . FLUoxetine (PROZAC) 20 MG capsule Take 20 mg by mouth every morning.    Marland Kitchen  lidocaine-prilocaine (EMLA) cream Apply to affected area once 30 g 3  . LORazepam (ATIVAN) 1 MG tablet Take 1 tablet (1 mg total) by mouth daily. (Patient taking differently: Take 1 mg by mouth daily as needed  for sleep. ) 30 tablet 2  . Nutritional Supplements (JUICE PLUS FIBRE PO) Take 2 each by mouth daily. [employer]    . Nutritional Supplements (JUICE PLUS FIBRE PO) Take 2 each by mouth daily. [employer]    . oxyCODONE-acetaminophen (PERCOCET/ROXICET) 5-325 MG tablet Take 1 tablet by mouth every 4 (four) hours as needed for severe pain. (Patient not taking: Reported on 08/07/2017) 30 tablet 0  . rizatriptan (MAXALT) 10 MG tablet Take 10 mg by mouth daily as needed for migraine. May repeat in 2 hours if needed     . zolpidem (AMBIEN) 10 MG tablet Take 10 mg by mouth at bedtime as needed for sleep.     No current facility-administered medications for this visit.     PHYSICAL EXAMINATION: ECOG PERFORMANCE STATUS: 1 - Symptomatic but completely ambulatory  Vitals:   10/12/17 1145  BP: 125/89  Pulse: 68  Resp: 17  Temp: 98.8 F (37.1 C)  SpO2: 100%   Filed Weights   10/12/17 1145  Weight: 157 lb 1.6 oz (71.3 kg)    GENERAL:alert, no distress and comfortable SKIN: skin color, texture, turgor are normal, no rashes or significant lesions EYES: normal, Conjunctiva are pink and non-injected, sclera clear OROPHARYNX:no exudate, no erythema and lips, buccal mucosa, and tongue normal  NECK: supple, thyroid normal size, non-tender, without nodularity LYMPH:  no palpable lymphadenopathy in the cervical, axillary or inguinal LUNGS: clear to auscultation and percussion with normal breathing effort HEART: regular rate & rhythm and no murmurs and no lower extremity edema ABDOMEN:abdomen soft, non-tender and normal bowel sounds MUSCULOSKELETAL:no cyanosis of digits and no clubbing  NEURO: alert & oriented x 3 with fluent speech, no focal motor/sensory deficits EXTREMITIES: No lower extremity edema   LABORATORY DATA:  I have reviewed the data as listed CMP Latest Ref Rng & Units 10/12/2017 09/01/2017 07/21/2017  Glucose 70 - 99 mg/dL 96 78 99  BUN 6 - 20 mg/dL _0 Creatinine 0.44 - 1.00  mg/dL 0.87 0.82 0.97  Sodium 135 - 145 mmol/L 139 141 139  Potassium 3.5 - 5.1 mmol/L 4.1 3.7 3.9  Chloride 98 - 111 mmol/L 106 106 104  CO2 22 - 32 mmol/L _1 Calcium 8.9 - 10.3 mg/dL 9.0 8.7(L) 9.6  Total Protein 6.5 - 8.1 g/dL 7.2 7.0 7.4  Total Bilirubin 0.3 - 1.2 mg/dL 0.4 0.3 0.3  Alkaline Phos 38 - 126 U/L 103 97 110  AST 15 - 41 U/L _2 ALT 0 - 44 U/L _3 Lab Results  Component Value Date   WBC 5.1 10/12/2017   HGB 11.3 (L) 10/12/2017   HCT 34.5 (L) 10/12/2017   MCV 88.5 10/12/2017   PLT 198 10/12/2017   NEUTROABS 3.5 10/12/2017    ASSESSMENT & PLAN:  Malignant neoplasm of lower-outer quadrant of left breast of female, estrogen receptor negative ([place]) Mammogram and ultrasound of the left breast revealed 1.7 cm mass at 4:00 position, 6:30 position 5 x 4 x 4 mm mass, 6:00 position 5 cm nipple 7 x 6 x 11 mm, left axillary lymph node with thickened cortex, T1c N1 stage II a AJCC 8  10/24/2016: Left breast biopsy 6:30 position 3 cm from  nipple: IDC grade 2, DCIS, ER 0%, PR 0%, 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 Neoadjuvant chemotherapy with TCHP completed 02/17/2017 this would be followed by Herceptinand Perjetamaintenance for 1 year. 2.Bilateral mastectomies 03/28/2016:Bilateral mastectomies: Left mastectomy: IDC grade 2 0.9 cm, nodes negative, right mastectomy benign, ER 0%, PR 0%, HER-2 positive ratio 2.6 3. Adjuvant radiation4/08/2017 to 06/09/2017 ---------------------------------------------------------------------------- Current treatment:Herceptin Perjeta maintenance to be completed in September 2019 Patient underwent breast reconstruction with expanders.  Herceptin Perjeta toxicities: Patient is tolerating it very well.  Today's the last dosage of Herceptin and  Perjeta.  Severe anemia: Monitoring closely. Severe radiation dermatitis: Resolved  Severe pain in the left shoulder and clavicle: Bone scan 09/11/2017: No evidence of metastatic disease.  I discussed the role of adjuvant neratinib in patients who were completed Herceptin maintenance therapy for HER-2 positive breast cancer.  I briefly discussed the data which revealed approximately 2% absolute difference in survival and decrease in brain metastases.  However it does come with the complication of diarrhea.  I sent a prescription for Neratinib.  I instructed her to only take 4 tablets a day initially we can increase it to 5 next month and 6 the following month if she tolerates it well.  Return to clinic in 3 weeks for follow-up on the Neratinib.  No orders of the defined types were placed in this encounter.  The patient has a good understanding of the overall plan. she agrees with it. she will call with any problems that may develop before the next visit here.   Harriette Ohara, MD 10/12/17

## 2017-10-12 NOTE — Patient Instructions (Signed)
[facility] Discharge Instructions for Patients Receiving Chemotherapy  Today you received the following chemotherapy agents Herceptin and Perjeta.   To help prevent nausea and vomiting after your treatment, we encourage you to take your nausea medication as directed.   If you develop nausea and vomiting that is not controlled by your nausea medication, call the clinic.   BELOW ARE SYMPTOMS THAT SHOULD BE REPORTED IMMEDIATELY:  *FEVER GREATER THAN 100.5 F  *CHILLS WITH OR WITHOUT FEVER  NAUSEA AND VOMITING THAT IS NOT CONTROLLED WITH YOUR NAUSEA MEDICATION  *UNUSUAL SHORTNESS OF BREATH  *UNUSUAL BRUISING OR BLEEDING  TENDERNESS IN MOUTH AND THROAT WITH OR WITHOUT PRESENCE OF ULCERS  *URINARY PROBLEMS  *BOWEL PROBLEMS  UNUSUAL RASH Items with * indicate a potential emergency and should be followed up as soon as possible.  Feel free to call the clinic should you have any questions or concerns. The clinic phone number is [phone].  Please show the CHEMO ALERT CARD at check-in to the Emergency Department and triage nurse.   

## 2017-10-12 NOTE — Progress Notes (Signed)
Pt received perjeta today.  Declined to stay for the 30 min post observation. Educated on the importance of the observation, as well as S/S the require her to report back to infusion clinic or ED to be evaluated. Pt denies any S/S of reaction. Pt husband also given information and left with pt.

## 2017-10-12 NOTE — Telephone Encounter (Signed)
Gave patient avs.  Patient did not need calendar.  °

## 2017-10-13 ENCOUNTER — Telehealth: Payer: Self-pay

## 2017-10-13 NOTE — Telephone Encounter (Signed)
Oral Oncology Patient Advocate Encounter  Prior Authorization for Nerlynx has been approved.    PA# [id number] Effective dates: 10/12/17 through 10/13/18  Oral Oncology Clinic will continue to follow.   [place] Patient [place] Phone [phone] Fax [phone]

## 2017-10-13 NOTE — Telephone Encounter (Signed)
Oral Oncology Patient Advocate Encounter  Received notification from [facility] that prior authorization for Nerlynx is required.  PA submitted on CoverMyMeds Key [id number] Status is pending  Oral Oncology Clinic will continue to follow.  [place] Patient Laura Anthony Phone [phone] Fax [phone]

## 2017-10-16 DIAGNOSIS — Z9013 Acquired absence of bilateral breasts and nipples: Secondary | ICD-10-CM | POA: Diagnosis not present

## 2017-10-16 DIAGNOSIS — Z923 Personal history of irradiation: Secondary | ICD-10-CM | POA: Insufficient documentation

## 2017-10-16 DIAGNOSIS — Z853 Personal history of malignant neoplasm of breast: Secondary | ICD-10-CM | POA: Diagnosis not present

## 2017-10-23 NOTE — Telephone Encounter (Signed)
Oral Oncology Patient Advocate Encounter  Confirmed with [facility] that Nerlynx was delivered 10/17/17  Laura Anthony Patient Laura Anthony Phone [phone] Fax [phone]

## 2017-10-24 DIAGNOSIS — Z01419 Encounter for gynecological examination (general) (routine) without abnormal findings: Secondary | ICD-10-CM | POA: Diagnosis not present

## 2017-10-24 DIAGNOSIS — Z6824 Body mass index (BMI) 24.0-24.9, adult: Secondary | ICD-10-CM | POA: Diagnosis not present

## 2017-10-24 DIAGNOSIS — R8290 Unspecified abnormal findings in urine: Secondary | ICD-10-CM | POA: Diagnosis not present

## 2017-10-25 ENCOUNTER — Telehealth: Payer: Self-pay | Admitting: Hematology and Oncology

## 2017-10-25 NOTE — Telephone Encounter (Signed)
Scheduled appt per 9/24 sch message- pt is aware of appt date and time.

## 2017-11-02 ENCOUNTER — Ambulatory Visit: Payer: 59 | Admitting: Hematology and Oncology

## 2017-11-02 ENCOUNTER — Other Ambulatory Visit: Payer: 59

## 2017-11-10 ENCOUNTER — Inpatient Hospital Stay (HOSPITAL_BASED_OUTPATIENT_CLINIC_OR_DEPARTMENT_OTHER): Payer: 59 | Admitting: Medical

## 2017-11-10 ENCOUNTER — Inpatient Hospital Stay: Payer: 59 | Attending: Hematology and Oncology

## 2017-11-10 ENCOUNTER — Telehealth: Payer: Self-pay | Admitting: *Deleted

## 2017-11-10 ENCOUNTER — Telehealth: Payer: Self-pay | Admitting: Hematology and Oncology

## 2017-11-10 VITALS — BP 119/84 | HR 71 | Temp 98.6°F | Resp 18 | Ht 67.0 in | Wt 155.6 lb

## 2017-11-10 DIAGNOSIS — C773 Secondary and unspecified malignant neoplasm of axilla and upper limb lymph nodes: Secondary | ICD-10-CM | POA: Insufficient documentation

## 2017-11-10 DIAGNOSIS — Z79899 Other long term (current) drug therapy: Secondary | ICD-10-CM | POA: Diagnosis not present

## 2017-11-10 DIAGNOSIS — E860 Dehydration: Secondary | ICD-10-CM

## 2017-11-10 DIAGNOSIS — C50912 Malignant neoplasm of unspecified site of left female breast: Secondary | ICD-10-CM

## 2017-11-10 DIAGNOSIS — Z9013 Acquired absence of bilateral breasts and nipples: Secondary | ICD-10-CM

## 2017-11-10 DIAGNOSIS — Z923 Personal history of irradiation: Secondary | ICD-10-CM | POA: Diagnosis not present

## 2017-11-10 DIAGNOSIS — Z9221 Personal history of antineoplastic chemotherapy: Secondary | ICD-10-CM

## 2017-11-10 DIAGNOSIS — Z171 Estrogen receptor negative status [ER-]: Secondary | ICD-10-CM | POA: Diagnosis not present

## 2017-11-10 DIAGNOSIS — R197 Diarrhea, unspecified: Secondary | ICD-10-CM | POA: Diagnosis not present

## 2017-11-10 DIAGNOSIS — C50512 Malignant neoplasm of lower-outer quadrant of left female breast: Secondary | ICD-10-CM | POA: Diagnosis not present

## 2017-11-10 LAB — CBC WITH DIFFERENTIAL/PLATELET
Abs Immature Granulocytes: 0.01 10*3/uL (ref 0.00–0.07)
Basophils Absolute: 0 10*3/uL (ref 0.0–0.1)
Basophils Relative: 1 %
Eosinophils Absolute: 0.1 10*3/uL (ref 0.0–0.5)
Eosinophils Relative: 1 %
HCT: 37.3 % (ref 36.0–46.0)
Hemoglobin: 12.1 g/dL (ref 12.0–15.0)
Immature Granulocytes: 0 %
Lymphocytes Relative: 21 %
Lymphs Abs: 1.1 10*3/uL (ref 0.7–4.0)
MCH: 28.8 pg (ref 26.0–34.0)
MCHC: 32.4 g/dL (ref 30.0–36.0)
MCV: 88.8 fL (ref 80.0–100.0)
Monocytes Absolute: 0.4 10*3/uL (ref 0.1–1.0)
Monocytes Relative: 7 %
Neutro Abs: 3.6 10*3/uL (ref 1.7–7.7)
Neutrophils Relative %: 70 %
Platelets: 197 10*3/uL (ref 150–400)
RBC: 4.2 MIL/uL (ref 3.87–5.11)
RDW: 13.5 % (ref 11.5–15.5)
WBC: 5.2 10*3/uL (ref 4.0–10.5)
nRBC: 0 % (ref 0.0–0.2)

## 2017-11-10 LAB — CMP (CANCER CENTER ONLY)
ALT: 12 U/L (ref 0–44)
AST: 18 U/L (ref 15–41)
Albumin: 3.5 g/dL (ref 3.5–5.0)
Alkaline Phosphatase: 105 U/L (ref 38–126)
Anion gap: 7 (ref 5–15)
BUN: 13 mg/dL (ref 6–20)
CO2: 24 mmol/L (ref 22–32)
Calcium: 8.9 mg/dL (ref 8.9–10.3)
Chloride: 107 mmol/L (ref 98–111)
Creatinine: 0.81 mg/dL (ref 0.44–1.00)
GFR, Est AFR Am: >60 mL/min (ref >60)
GFR, Estimated: >60 mL/min (ref >60)
Glucose, Bld: 97 mg/dL (ref 70–99)
Potassium: 4 mmol/L (ref 3.5–5.1)
Sodium: 138 mmol/L (ref 135–145)
Total Bilirubin: 0.2 mg/dL — ABNORMAL LOW (ref 0.3–1.2)
Total Protein: 7.2 g/dL (ref 6.5–8.1)

## 2017-11-10 LAB — C DIFFICILE QUICK SCREEN W PCR REFLEX
C Diff antigen: NEGATIVE
C Diff interpretation: NOT DETECTED
C Diff toxin: NEGATIVE

## 2017-11-10 LAB — MAGNESIUM: Magnesium: 1.9 mg/dL (ref 1.7–2.4)

## 2017-11-10 MED ORDER — SODIUM CHLORIDE 0.9% FLUSH
10.0000 mL | Freq: Once | INTRAVENOUS | Status: AC
  Administered 2017-11-10: 10 mL via INTRAVENOUS
  Filled 2017-11-10: qty 10

## 2017-11-10 MED ORDER — DIPHENOXYLATE-ATROPINE 2.5-0.025 MG PO TABS
2.0000 | ORAL_TABLET | Freq: Once | ORAL | Status: AC
  Administered 2017-11-10: 2 via ORAL

## 2017-11-10 MED ORDER — DIPHENOXYLATE-ATROPINE 2.5-0.025 MG PO TABS
ORAL_TABLET | ORAL | Status: AC
  Filled 2017-11-10: qty 2

## 2017-11-10 MED ORDER — HEPARIN SOD (PORK) LOCK FLUSH 100 UNIT/ML IV SOLN
500.0000 [IU] | Freq: Once | INTRAVENOUS | Status: AC
  Administered 2017-11-10: 500 [IU] via INTRAVENOUS
  Filled 2017-11-10: qty 5

## 2017-11-10 MED ORDER — CHOLESTYRAMINE 4 G PO PACK: 4.0000 g | PACK | Freq: Three times a day (TID) | ORAL | 12 refills | Status: DC

## 2017-11-10 MED ORDER — SODIUM CHLORIDE 0.9 % IV SOLN
Freq: Once | INTRAVENOUS | Status: AC
  Administered 2017-11-10: 12:00:00 via INTRAVENOUS
  Filled 2017-11-10: qty 250

## 2017-11-10 MED FILL — CHOLESTYRAMINE PACKET: 4 | 20 days supply | Qty: 60 | Fill #0

## 2017-11-10 NOTE — Telephone Encounter (Signed)
Pt informed severe diarrhea that is not being relieved by immodium. Per Dr. Lindi Adie pt to stop medication and be seen in [facility]. Informed pt she will receive a call for an appt and instructed her to stop medication.

## 2017-11-10 NOTE — Patient Instructions (Signed)
Dehydration, Adult Dehydration is when there is not enough fluid or water in your body. This happens when you lose more fluids than you take in. Dehydration can range from mild to very bad. It should be treated right away to keep it from getting very bad. Symptoms of mild dehydration may include:  Thirst.  Dry lips.  Slightly dry mouth.  Dry, warm skin.  Dizziness. Symptoms of moderate dehydration may include:  Very dry mouth.  Muscle cramps.  Dark pee (urine). Pee may be the color of tea.  Your body making less pee.  Your eyes making fewer tears.  Heartbeat that is uneven or faster than normal (palpitations).  Headache.  Light-headedness, especially when you stand up from sitting.  Fainting (syncope). Symptoms of very bad dehydration may include:  Changes in skin, such as: ? Cold and clammy skin. ? Blotchy (mottled) or pale skin. ? Skin that does not quickly return to normal after being lightly pinched and let go (poor skin turgor).  Changes in body fluids, such as: ? Feeling very thirsty. ? Your eyes making fewer tears. ? Not sweating when body temperature is high, such as in hot weather. ? Your body making very little pee.  Changes in vital signs, such as: ? Weak pulse. ? Pulse that is more than 100 beats a minute when you are sitting still. ? Fast breathing. ? Low blood pressure.  Other changes, such as: ? Sunken eyes. ? Cold hands and feet. ? Confusion. ? Lack of energy (lethargy). ? Trouble waking up from sleep. ? Short-term weight loss. ? Unconsciousness. Follow these instructions at home:  If told by your doctor, drink an ORS: ? Make an ORS by using instructions on the package. ? Start by drinking small amounts, about  cup (120 mL) every 5-10 minutes. ? Slowly drink more until you have had the amount that your doctor said to have.  Drink enough clear fluid to keep your pee clear or pale yellow. If you were told to drink an ORS, finish the ORS  first, then start slowly drinking clear fluids. Drink fluids such as: ? Water. Do not drink only water by itself. Doing that can make the salt (sodium) level in your body get too low (hyponatremia). ? Ice chips. ? Fruit juice that you have added water to (diluted). ? Low-calorie sports drinks.  Avoid: ? Alcohol. ? Drinks that have a lot of sugar. These include high-calorie sports drinks, fruit juice that does not have water added, and soda. ? Caffeine. ? Foods that are greasy or have a lot of fat or sugar.  Take over-the-counter and prescription medicines only as told by your doctor.  Do not take salt tablets. Doing that can make the salt level in your body get too high (hypernatremia).  Eat foods that have minerals (electrolytes). Examples include bananas, oranges, potatoes, tomatoes, and spinach.  Keep all follow-up visits as told by your doctor. This is important. Contact a doctor if:  You have belly (abdominal) pain that: ? Gets worse. ? Stays in one area (localizes).  You have a rash.  You have a stiff neck.  You get angry or annoyed more easily than normal (irritability).  You are more sleepy than normal.  You have a harder time waking up than normal.  You feel: ? Weak. ? Dizzy. ? Very thirsty.  You have peed (urinated) only a small amount of very dark pee during 6-8 hours. Get help right away if:  You have symptoms of   very bad dehydration.  You cannot drink fluids without throwing up (vomiting).  Your symptoms get worse with treatment.  You have a fever.  You have a very bad headache.  You are throwing up or having watery poop (diarrhea) and it: ? Gets worse. ? Does not go away.  You have blood or something green (bile) in your throw-up.  You have blood in your poop (stool). This may cause poop to look black and tarry.  You have not peed in 6-8 hours.  You pass out (faint).  Your heart rate when you are sitting still is more than 100 beats a  minute.  You have trouble breathing. This information is not intended to replace advice given to you by your health care provider. Make sure you discuss any questions you have with your health care provider. Document Released: 11/13/2008 Document Revised: 08/07/2015 Document Reviewed: 03/13/2015 Elsevier Interactive Patient Education  2018 [employer]  

## 2017-11-10 NOTE — Telephone Encounter (Signed)
Left message for patient per 10/11 sch message - left message for patient to call back to set up appt to be worked in today,

## 2017-11-13 NOTE — Progress Notes (Signed)
[facility] Progress Note   Candus Braud [id number] 07/24/1975 42 y.o.  Laura Anthony is managed by Dr. Nicholas Lose  Actively treated with chemotherapy/immunotherapy: yes  Current Therapy: Nerlynx  Assessment: Plan:    Breast cancer, stage 2, left ([place]) - Plan: CMP ([place] only), Magnesium, heparin lock flush 100 unit/mL, sodium chloride flush (NS) 0.9 % injection 10 mL  Dehydration - Plan: 0.9 %  sodium chloride infusion, diphenoxylate-atropine (LOMOTIL) 2.5-0.025 MG per tablet 2 tablet, heparin lock flush 100 unit/mL, sodium chloride flush (NS) 0.9 % injection 10 mL  Diarrhea, unspecified type - Plan: CMP ([place] only), Magnesium, 0.9 %  sodium chloride infusion, diphenoxylate-atropine (LOMOTIL) 2.5-0.025 MG per tablet 2 tablet, C difficile quick screen w PCR reflex, heparin lock flush 100 unit/mL, sodium chloride flush (NS) 0.9 % injection 10 mL, cholestyramine (QUESTRAN) 4 g packet   ER, 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  She has been told to hold this medication this weekend.  I have told her that someone will be in contact regarding restarting therapy.  Dehydration and diarrhea: Magnesium level, chemistry panel, and CBC were collected today.  Results were within normal limits.  She was told to hold  Nerlynx this weekend.  A C. difficile PCR returned negative.  This was ordered given the fact that she had recently been treated with antibiotics.  She was given Lomotil 2 tablets x 1 and 1 L of normal saline.  This case was discussed with Dr. Jana Hakim.  She was placed on Questran 4 g to be taken 3 times daily with meals.  Please see After Visit Summary for patient specific instructions.  Future Appointments  Date Time Provider [place]  11/21/2017  9:15 AM [facility] LAB 4 [facility] None  11/21/2017  9:45 AM [facility] [place] FLUSH [facility] None    11/21/2017 10:15 AM Nicholas Lose, MD [facility] None    Orders Placed This Encounter  Procedures  . C difficile quick screen w PCR reflex  . CMP ([place] only)  . Magnesium       Subjective:   Patient ID:  Leahmarie Gasiorowski is a 42 y.o. (DOB [birth year]) female.  Chief Complaint:  Chief Complaint  Patient presents with  . Diarrhea    HPI Itzy Adler is a 42 year old female with a diagnosis of an ER, PR negative and HER-2/neu positive stage II left breast cancer who is managed by Dr. Lindi Adie and is currently treated on Nerlynx.  She began Nerlynx on Saturday and developed diarrhea on Monday.  She is been having multiple watery stools each day.  She has been taking 2 Imodium every 6 hours for total of 8 doses a day.  She reports that the effects of the Imodium the last for around 2 hours.  She has had 6 watery stools this morning already.  She is recently completed a course of Bactrim for urinary tract infection.  She has had nausea today but no vomiting.  Her p.o. intake has decreased as everything that she eats causes her to have diarrhea.  She denies fevers, chills, or sweats.  Medications: I have reviewed the patient's current medications.  Allergies:  Allergies  Allergen Reactions  . Sumatriptan Other (See Comments)    Numbness to face   . Statins Other (See Comments)    Leg pain     Past Medical History:  Diagnosis Date  . ADD (attention deficit  disorder)   . Anemia   . Anxiety   . 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 Breast cancer, left breast ([place])    S/P mastectomy 03/27/2017  . DVT (deep venous thrombosis) ([place]) 2017   calf left - probably due to [facility] pills-took eliquis x3 mos, nonthing now  . Family history of breast cancer   . Family history of prostate cancer   . High cholesterol   . Impingement syndrome of right shoulder 07/2013  . Migraine    "usually 1/month" (03/28/2017)  . PONV  (postoperative nausea and vomiting)   . Right bicipital tenosynovitis 07/2013  . Rotator cuff impingement syndrome of right shoulder 07/12/2013  . Seizures ([place])    x 1 as a child - was never on anticonvulsants (03/28/2017)    Past Surgical History:  Procedure Laterality Date  . ADENOIDECTOMY  1981  . ANKLE ARTHROSCOPY Right   . BREAST BIOPSY Left 10/2016  . KNEE ARTHROSCOPY Right   . KNEE ARTHROSCOPY W/ ACL RECONSTRUCTION Left   . MASTECTOMY Left 03/27/2017   NIPPLE SPARING MASTECTOMY WITH RADIOACTIVE SEED TARGETED LYMPH NODE EXCISION AND LEFT AXILLARY SENTINEL LYMPH NODE BIOPSY  . MASTECTOMY Right 03/27/2017   RIGHT PROPHYLACTIC NIPPLE SPARING MASTECTOMY  . NIPPLE SPARING MASTECTOMY Right 03/27/2017   Procedure: RIGHT PROPHYLACTIC NIPPLE SPARING MASTECTOMY;  Surgeon: Rolm Bookbinder, MD;  Location: [place];  Service: General;  Laterality: Right;  . PORTACATH PLACEMENT N/A 11/01/2016   Procedure: INSERTION PORT-A-CATH WITH [place];  Surgeon: Rolm Bookbinder, MD;  Location: [place];  Service: General;  Laterality: N/A;  . RADIOACTIVE SEED GUIDED AXILLARY SENTINEL LYMPH NODE Left 03/27/2017   Procedure: LEFT NIPPLE SPARING MASTECTOMY WITH RADIOACTIVE SEED TARGETED LYMPH NODE EXCISION AND LEFT AXILLARY SENTINEL LYMPH NODE BIOPSY;  Surgeon: Rolm Bookbinder, MD;  Location: [place];  Service: General;  Laterality: Left;  REQUESTS RNFA  . RECONSTRUCTION BREAST IMMEDIATE / DELAYED W/ TISSUE EXPANDER Bilateral 03/27/2017   BILATERAL BREAST RECONSTRUCTION WITH PLACEMENT OF TISSUE EXPANDER AND ALLODERM  . SEPTOPLASTY WITH ETHMOIDECTOMY, AND MAXILLARY ANTROSTOMY  10/29/2010   bilat. max. antrostomy with left max. stripping; left ant. ethmoidectomy; right total ethmoidectomy; sphenoidotomy  . SHOULDER ARTHROSCOPY WITH SUBACROMIAL DECOMPRESSION AND BICEP TENDON REPAIR Right 07/12/2013   Procedure: RIGHT SHOULDER ARTHROSCOPY DEBRIDEMENT EXTENSIVE DECOMPRESSION  SUBACROMIAL PARTIAL ACROMIOPLASTY;  Surgeon: Johnny Bridge, MD;  Location: [place];  Service: Orthopedics;  Laterality: Right;  . WRIST ARTHROSCOPY  01/17/2012   Procedure: ARTHROSCOPY WRIST; right wrist Surgeon: Tennis Must, MD;  Location: [place];  Service: Orthopedics;  Laterality: Right;  RIGHT WRIST ARTHROSCOPY WITH TRIANGULAR FIBROCARTILAGE COMPLEX REPAIR AND DEBRIDEMENT     Family History  Problem Relation Age of Onset  . Breast cancer Mother 81       triple negative  . Leukemia Father   . Lung cancer Father   . Heart attack Maternal Uncle   . Prostate cancer Paternal Uncle   . COPD Paternal Grandmother   . Heart disease Paternal Grandfather   . Prostate cancer Paternal Uncle   . Leukemia Cousin     Social History   Socioeconomic History  . Marital status: Married    Spouse name: Not on file  . Number of children: Not on file  . Years of education: Not on file  . Highest education level: Not on file  Occupational History  . Not on file  Social Needs  . Financial  resource strain: Not on file  . Food insecurity:    Worry: Not on file    Inability: Not on file  . Transportation needs:    Medical: Not on file    Non-medical: Not on file  Tobacco Use  . Smoking status: Never Smoker  . Smokeless tobacco: Never Used  Substance and Sexual Activity  . Alcohol use: Yes    Alcohol/week: 1.0 standard drinks    Types: 1 Glasses of wine per week  . Drug use: No  . Sexual activity: Yes    Birth control/protection: Surgical    Comment: husband has had a vasectomy  Lifestyle  . Physical activity:    Days per week: Not on file    Minutes per session: Not on file  . Stress: Not on file  Relationships  . Social connections:    Talks on phone: Not on file    Gets together: Not on file    Attends religious service: Not on file    Active member of club or organization: Not on file    Attends meetings of clubs or organizations: Not on  file    Relationship status: Not on file  . Intimate partner violence:    Fear of current or ex partner: Not on file    Emotionally abused: Not on file    Physically abused: Not on file    Forced sexual activity: Not on file  Other Topics Concern  . Not on file  Social History Narrative  . Not on file    Past Medical History, Surgical history, Social history, and Family history were reviewed and updated as appropriate.   Please see review of systems for further details on the patient's review from today.   Review of Systems:  Review of Systems  Constitutional: Negative for appetite change, chills, diaphoresis and fever.  HENT: Negative for trouble swallowing.   Respiratory: Negative for cough, chest tightness and shortness of breath.   Cardiovascular: Negative for chest pain, palpitations and leg swelling.  Gastrointestinal: Positive for diarrhea and nausea. Negative for abdominal distention, abdominal pain, blood in stool, constipation and vomiting.  Genitourinary: Negative for decreased urine volume and difficulty urinating.  Neurological: Negative for dizziness and weakness.    Objective:   Physical Exam:  BP 119/84 (BP Location: Left Arm, Patient Position: Sitting)   Pulse 71   Temp 98.6 F (37 C) (Oral)   Resp 18   Ht '5\' 7"'$  (1.702 m)   Wt 155 lb 9.6 oz (70.6 kg)   SpO2 100%   BMI 24.37 kg/m  ECOG: 0  Physical Exam  Constitutional: No distress.  HENT:  Head: Normocephalic and atraumatic.  Mouth/Throat: Oropharynx is clear and moist.  Cardiovascular: Normal rate, regular rhythm and normal heart sounds. Exam reveals no gallop and no friction rub.  No murmur heard. Pulmonary/Chest: Effort normal and breath sounds normal. No respiratory distress. She has no wheezes. She has no rales.  Abdominal: Soft. She exhibits no distension and no mass. Bowel sounds are increased. There is no tenderness. There is no rebound and no guarding.  Musculoskeletal: She exhibits no  edema.  Neurological: She is alert.  Skin: Skin is warm and dry. She is not diaphoretic.    Lab Review:     Component Value Date/Time   NA 138 11/10/2017 1136   NA 138 01/27/2017 0802   K 4.0 11/10/2017 1136   K 4.1 01/27/2017 0802   CL 107 11/10/2017 1136   CO2 24 11/10/2017  1136   CO2 26 01/27/2017 0802   GLUCOSE 97 11/10/2017 1136   GLUCOSE 90 01/27/2017 0802   BUN 13 11/10/2017 1136   BUN 14.0 01/27/2017 0802   CREATININE 0.81 11/10/2017 1136   CREATININE 0.7 01/27/2017 0802   CALCIUM 8.9 11/10/2017 1136   CALCIUM 8.6 01/27/2017 0802   PROT 7.2 11/10/2017 1136   PROT 6.5 01/27/2017 0802   ALBUMIN 3.5 11/10/2017 1136   ALBUMIN 3.5 01/27/2017 0802   AST 18 11/10/2017 1136   AST 22 01/27/2017 0802   ALT 12 11/10/2017 1136   ALT 23 01/27/2017 0802   ALKPHOS 105 11/10/2017 1136   ALKPHOS 85 01/27/2017 0802   BILITOT 0.2 (L) 11/10/2017 1136   BILITOT 0.23 01/27/2017 0802   GFRNONAA >60 11/10/2017 1136   GFRAA >60 11/10/2017 1136       Component Value Date/Time   WBC 5.2 11/10/2017 1135   RBC 4.20 11/10/2017 1135   HGB 12.1 11/10/2017 1135   HGB 11.5 (L) 07/21/2017 0847   HGB 11.1 (L) 01/27/2017 0802   HCT 37.3 11/10/2017 1135   HCT 33.4 (L) 01/27/2017 0802   PLT 197 11/10/2017 1135   PLT 197 07/21/2017 0847   PLT 170 01/27/2017 0802   MCV 88.8 11/10/2017 1135   MCV 99.0 01/27/2017 0802   MCH 28.8 11/10/2017 1135   MCHC 32.4 11/10/2017 1135   RDW 13.5 11/10/2017 1135   RDW 18.2 (H) 01/27/2017 0802   LYMPHSABS 1.1 11/10/2017 1135   LYMPHSABS 1.7 01/27/2017 0802   MONOABS 0.4 11/10/2017 1135   MONOABS 0.5 01/27/2017 0802   EOSABS 0.1 11/10/2017 1135   EOSABS 0.0 01/27/2017 0802   BASOSABS 0.0 11/10/2017 1135   BASOSABS 0.0 01/27/2017 0802   -------------------------------  Imaging from last 24 hours (if applicable):  Radiology interpretation: No results found.      This case was discussed with Dr. Jana Hakim. He expressed his agreement with my  management of this patient.

## 2017-11-14 DIAGNOSIS — J029 Acute pharyngitis, unspecified: Secondary | ICD-10-CM | POA: Diagnosis not present

## 2017-11-20 ENCOUNTER — Other Ambulatory Visit: Payer: Self-pay

## 2017-11-20 DIAGNOSIS — Z171 Estrogen receptor negative status [ER-]: Secondary | ICD-10-CM

## 2017-11-20 DIAGNOSIS — C50512 Malignant neoplasm of lower-outer quadrant of left female breast: Secondary | ICD-10-CM

## 2017-11-21 ENCOUNTER — Inpatient Hospital Stay: Payer: 59

## 2017-11-21 ENCOUNTER — Inpatient Hospital Stay (HOSPITAL_BASED_OUTPATIENT_CLINIC_OR_DEPARTMENT_OTHER): Payer: 59 | Admitting: Hematology and Oncology

## 2017-11-21 ENCOUNTER — Telehealth: Payer: Self-pay | Admitting: Hematology and Oncology

## 2017-11-21 DIAGNOSIS — Z9013 Acquired absence of bilateral breasts and nipples: Secondary | ICD-10-CM

## 2017-11-21 DIAGNOSIS — Z79899 Other long term (current) drug therapy: Secondary | ICD-10-CM

## 2017-11-21 DIAGNOSIS — C50512 Malignant neoplasm of lower-outer quadrant of left female breast: Secondary | ICD-10-CM

## 2017-11-21 DIAGNOSIS — Z95828 Presence of other vascular implants and grafts: Secondary | ICD-10-CM

## 2017-11-21 DIAGNOSIS — Z171 Estrogen receptor negative status [ER-]: Secondary | ICD-10-CM

## 2017-11-21 DIAGNOSIS — S61411A Laceration without foreign body of right hand, initial encounter: Secondary | ICD-10-CM | POA: Diagnosis not present

## 2017-11-21 DIAGNOSIS — R197 Diarrhea, unspecified: Secondary | ICD-10-CM

## 2017-11-21 DIAGNOSIS — C773 Secondary and unspecified malignant neoplasm of axilla and upper limb lymph nodes: Secondary | ICD-10-CM

## 2017-11-21 DIAGNOSIS — Z9221 Personal history of antineoplastic chemotherapy: Secondary | ICD-10-CM

## 2017-11-21 DIAGNOSIS — Z923 Personal history of irradiation: Secondary | ICD-10-CM

## 2017-11-21 LAB — CMP (CANCER CENTER ONLY)
ALT: 14 U/L (ref 0–44)
AST: 17 U/L (ref 15–41)
Albumin: 3.6 g/dL (ref 3.5–5.0)
Alkaline Phosphatase: 99 U/L (ref 38–126)
Anion gap: 8 (ref 5–15)
BUN: 12 mg/dL (ref 6–20)
CO2: 25 mmol/L (ref 22–32)
Calcium: 9.2 mg/dL (ref 8.9–10.3)
Chloride: 106 mmol/L (ref 98–111)
Creatinine: 0.83 mg/dL (ref 0.44–1.00)
GFR, Est AFR Am: >60 mL/min (ref >60)
GFR, Estimated: >60 mL/min (ref >60)
Glucose, Bld: 80 mg/dL (ref 70–99)
Potassium: 4.3 mmol/L (ref 3.5–5.1)
Sodium: 139 mmol/L (ref 135–145)
Total Bilirubin: 0.3 mg/dL (ref 0.3–1.2)
Total Protein: 7.4 g/dL (ref 6.5–8.1)

## 2017-11-21 LAB — CBC WITH DIFFERENTIAL (CANCER CENTER ONLY)
Abs Immature Granulocytes: 0.02 10*3/uL (ref 0.00–0.07)
Basophils Absolute: 0.1 10*3/uL (ref 0.0–0.1)
Basophils Relative: 1 %
Eosinophils Absolute: 0.1 10*3/uL (ref 0.0–0.5)
Eosinophils Relative: 1 %
HCT: 37.4 % (ref 36.0–46.0)
Hemoglobin: 11.9 g/dL — ABNORMAL LOW (ref 12.0–15.0)
Immature Granulocytes: 0 %
Lymphocytes Relative: 20 %
Lymphs Abs: 1.2 10*3/uL (ref 0.7–4.0)
MCH: 28.8 pg (ref 26.0–34.0)
MCHC: 31.8 g/dL (ref 30.0–36.0)
MCV: 90.6 fL (ref 80.0–100.0)
Monocytes Absolute: 0.4 10*3/uL (ref 0.1–1.0)
Monocytes Relative: 7 %
Neutro Abs: 4.4 10*3/uL (ref 1.7–7.7)
Neutrophils Relative %: 71 %
Platelet Count: 214 10*3/uL (ref 150–400)
RBC: 4.13 MIL/uL (ref 3.87–5.11)
RDW: 13.6 % (ref 11.5–15.5)
WBC Count: 6.2 10*3/uL (ref 4.0–10.5)
nRBC: 0 % (ref 0.0–0.2)

## 2017-11-21 MED ORDER — DIPHENOXYLATE-ATROPINE 2.5-0.025 MG PO TABS: 1.0000 | ORAL_TABLET | Freq: Four times a day (QID) | ORAL | 3 refills | Status: DC | PRN

## 2017-11-21 MED ORDER — SODIUM CHLORIDE 0.9% FLUSH
10.0000 mL | INTRAVENOUS | Status: DC | PRN
  Administered 2017-11-21: 10 mL via INTRAVENOUS
  Filled 2017-11-21: qty 10

## 2017-11-21 MED ORDER — HEPARIN SOD (PORK) LOCK FLUSH 100 UNIT/ML IV SOLN
500.0000 [IU] | Freq: Once | INTRAVENOUS | Status: AC | PRN
  Administered 2017-11-21: 500 [IU] via INTRAVENOUS
  Filled 2017-11-21: qty 5

## 2017-11-21 NOTE — Telephone Encounter (Signed)
Gave patient avs and calendar.   °

## 2017-11-21 NOTE — Progress Notes (Signed)
Patient Care Team: [name], MD as PCP - General (Family Medicine)  DIAGNOSIS:  Encounter Diagnosis  Name Primary?  . Malignant neoplasm of lower-outer quadrant of left breast of female, estrogen receptor negative ([place])     SUMMARY OF ONCOLOGIC HISTORY:   Malignant neoplasm of lower-outer quadrant of left breast of female, estrogen receptor negative ([place])   10/20/2016 Mammogram    Mammogram and ultrasound of the left breast revealed 1.7 cm mass at 4:00 position, 6:30 position 5 x 4 x 4 mm mass, 6:00 position 5 cm nipple 7 x 6 x 11 mm, left axillary lymph node with thickened cortex, T1c N1 stage II a AJCC 8    10/24/2016 Initial Diagnosis    Left breast biopsy 6:30 position 3 cm from nipple: IDC grade 2, DCIS, ER 0%, PR 0%, Ki-67 15% HER-2 positive ratio 2.1; 4:00 position 3 cm from nipple: IDC grade 2, DCIS, ER 0%, PR 0%, Ki-67 35%, HER-2 positive ratio 2.02; left axillary lymph node biopsy positive    11/04/2016 - 02/17/2017 Neo-Adjuvant Chemotherapy    TCH Perjeta 6 cycles followed by Herceptin + Perjeta maintenance to be completed September 2019    11/30/2016 Genetic Testing    Negative genetic testing on the common hereditary cancer panel.  The Hereditary Gene Panel offered by [facility] includes sequencing and/or deletion duplication testing of the following 47 genes: APC, ATM, AXIN2, BARD1, BMPR1A, BRCA1, BRCA2, BRIP1, CDH1, CDK4, CDKN2A (p14ARF), CDKN2A (p16INK4a), CHEK2, CTNNA1, DICER1, EPCAM (Deletion/duplication testing only), GREM1 (promoter region deletion/duplication testing only), KIT, MEN1, MLH1, MSH2, MSH3, MSH6, MUTYH, NBN, NF1, NHTL1, PALB2, PDGFRA, PMS2, POLD1, POLE, PTEN, RAD50, RAD51C, RAD51D, SDHB, SDHC, SDHD, SMAD4, SMARCA4. STK11, TP53, TSC1, TSC2, and VHL.  The following genes were evaluated for sequence changes only: SDHA and HOXB13 c.251G>A variant only. The report date is November 30, 2016.     03/27/2017 Surgery    Bilateral mastectomies: Left mastectomy:  IDC grade 2 0.9 cm, nodes negative, right mastectomy benign, ER 0%, PR 0%, HER-2 positive ratio 2.6    05/08/2017 - 06/09/2017 Radiation Therapy    Adjuvant radiation therapy    10/23/2017 Miscellaneous    Neratinib     CHIEF COMPLIANT: Follow-up on neratinib therapy  INTERVAL HISTORY: Laura Anthony is a 42 year old with above-mentioned history of HER-2 positive breast cancer who underwent bilateral mastectomies after neoadjuvant chemotherapy and radiation is currently on neratinib after the conclusion of Herceptin and Perjeta maintenance.  She has had profound diarrhea from the right knee.  She is only on 4 tablets a day and had been going up to 10 times per day.  We gave her Questran and she started to have less diarrhea.  Unfortunately she is still having up to 5 diarrhea stools per day.  She has not been taking Lomotil or Imodium.  REVIEW OF SYSTEMS:   Constitutional: Denies fevers, chills or abnormal weight loss Eyes: Denies blurriness of vision Ears, nose, mouth, throat, and face: Denies mucositis or sore throat Respiratory: Denies cough, dyspnea or wheezes Cardiovascular: Denies palpitation, chest discomfort Gastrointestinal: Diarrhea Skin: Denies abnormal skin rashes Lymphatics: Denies new lymphadenopathy or easy bruising Neurological:Denies numbness, tingling or new weaknesses Behavioral/Psych: Mood is stable, no new changes  Extremities: No lower extremity edema   All other systems were reviewed with the patient and are negative.  I have reviewed the past medical history, past surgical history, social history and family history with the patient and they are unchanged from previous note.  ALLERGIES:  is allergic to sumatriptan and statins.  MEDICATIONS:  Current Outpatient Medications  Medication Sig Dispense Refill  . cholestyramine (QUESTRAN) 4 g packet Take 1 packet (4 g total) by mouth 3 (three) times daily with meals. 60 each 12  . diphenoxylate-atropine  (LOMOTIL) 2.5-0.025 MG tablet Take 1 tablet by mouth 4 (four) times daily as needed for diarrhea or loose stools. 30 tablet 3  . FLUoxetine (PROZAC) 20 MG capsule Take 20 mg by mouth every morning.    . lidocaine-prilocaine (EMLA) cream Apply to affected area once 30 g 3  . LORazepam (ATIVAN) 1 MG tablet Take 1 tablet (1 mg total) by mouth daily. (Patient taking differently: Take 1 mg by mouth daily as needed for sleep. ) 30 tablet 2  . Neratinib Maleate (NERLYNX) 40 MG tablet Take 6 tablets (240 mg total) by mouth daily. Take with food. 180 tablet 3  . Nutritional Supplements (JUICE PLUS FIBRE PO) Take 2 each by mouth daily. [employer]    . Nutritional Supplements (JUICE PLUS FIBRE PO) Take 2 each by mouth daily. [employer]    . oxyCODONE-acetaminophen (PERCOCET/ROXICET) 5-325 MG tablet Take 1 tablet by mouth every 4 (four) hours as needed for severe pain. (Patient not taking: Reported on 11/10/2017) 30 tablet 0  . rizatriptan (MAXALT) 10 MG tablet Take 10 mg by mouth daily as needed for migraine. May repeat in 2 hours if needed     . zolpidem (AMBIEN) 10 MG tablet Take 10 mg by mouth at bedtime as needed for sleep.     No current facility-administered medications for this visit.     PHYSICAL EXAMINATION: ECOG PERFORMANCE STATUS: 1 - Symptomatic but completely ambulatory  Vitals:   11/21/17 1006  BP: 102/73  Pulse: 64  Resp: 19  Temp: 98.6 F (37 C)  SpO2: 100%   Filed Weights   11/21/17 1006  Weight: 159 lb 1.6 oz (72.2 kg)    GENERAL:alert, no distress and comfortable SKIN: skin color, texture, turgor are normal, no rashes or significant lesions EYES: normal, Conjunctiva are pink and non-injected, sclera clear OROPHARYNX:no exudate, no erythema and lips, buccal mucosa, and tongue normal  NECK: supple, thyroid normal size, non-tender, without nodularity LYMPH:  no palpable lymphadenopathy in the cervical, axillary or inguinal LUNGS: clear to auscultation and percussion  with normal breathing effort HEART: regular rate & rhythm and no murmurs and no lower extremity edema ABDOMEN:abdomen soft, non-tender and normal bowel sounds MUSCULOSKELETAL:no cyanosis of digits and no clubbing  NEURO: alert & oriented x 3 with fluent speech, no focal motor/sensory deficits EXTREMITIES: No lower extremity edema   LABORATORY DATA:  I have reviewed the data as listed CMP Latest Ref Rng & Units 11/21/2017 11/10/2017 10/12/2017  Glucose 70 - 99 mg/dL 80 97 96  BUN 6 - 20 mg/dL _0 Creatinine 0.44 - 1.00 mg/dL 0.83 0.81 0.87  Sodium 135 - 145 mmol/L 139 138 139  Potassium 3.5 - 5.1 mmol/L 4.3 4.0 4.1  Chloride 98 - 111 mmol/L 106 107 106  CO2 22 - 32 mmol/L _1 Calcium 8.9 - 10.3 mg/dL 9.2 8.9 9.0  Total Protein 6.5 - 8.1 g/dL 7.4 7.2 7.2  Total Bilirubin 0.3 - 1.2 mg/dL 0.3 0.2(L) 0.4  Alkaline Phos 38 - 126 U/L 99 105 103  AST 15 - 41 U/L _2 ALT 0 - 44 U/L _3 Lab Results  Component Value Date   WBC  6.2 11/21/2017   HGB 11.9 (L) 11/21/2017   HCT 37.4 11/21/2017   MCV 90.6 11/21/2017   PLT 214 11/21/2017   NEUTROABS 4.4 11/21/2017    ASSESSMENT & PLAN:  Malignant neoplasm of lower-outer quadrant of left breast of female, estrogen receptor negative ([facility]) Mammogram and ultrasound of the left breast revealed 1.7 cm mass at 4:00 position, 6:30 position 5 x 4 x 4 mm mass, 6:00 position 5 cm nipple 7 x 6 x 11 mm, left axillary lymph node with thickened cortex, T1c N1 stage II a AJCC 8  10/24/2016: Left breast biopsy 6:30 position 3 cm from nipple: IDC grade 2, DCIS, ER 0%, PR 0%, 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 Neoadjuvant chemotherapy with TCHPcompleted 02/17/2017 this would be followed by Herceptinand Perjetamaintenance for 1 year completed September  2019 2.Bilateral mastectomies 03/28/2016:Bilateral mastectomies: Left mastectomy: IDC grade 2 0.9 cm, nodes negative, right mastectomy benign, ER 0%, PR 0%, HER-2 positive ratio 2.6 3.Adjuvant radiation4/08/2017 to 06/09/2017 ---------------------------------------------------------------------------- Current treatment:Neratinib started 10/12/2017 Patient underwent breast reconstruction with expanders.  Neratinib toxicities:  Profound diarrhea: Initially significant degree of diarrhea which finally got better with Questran but still having up to 5 loose stools per day. I instructed her to take Lomotil and Imodium. We will keep the dosage of neratinib at 4 tablets a day until the end of December. She is upset that she has not lost any weight in spite of all this time.  Severe anemia: Resolved  Return to clinic in 2 months for follow-up    No orders of the defined types were placed in this encounter.  The patient has a good understanding of the overall plan. she agrees with it. she will call with any problems that may develop before the next visit here.   Harriette Ohara, MD 11/21/17

## 2017-11-21 NOTE — Assessment & Plan Note (Signed)
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 Neoadjuvant chemotherapy with TCHPcompleted 02/17/2017 this would be followed by Herceptinand Perjetamaintenance for 1 year completed September 2019 2.Bilateral mastectomies 03/28/2016:Bilateral mastectomies: Left mastectomy: IDC grade 2 0.9 cm, nodes negative, right mastectomy benign, ER 0%, PR 0%, HER-2 positive ratio 2.6 3.Adjuvant radiation4/08/2017 to 06/09/2017 ---------------------------------------------------------------------------- Current treatment:Neratinib started 10/12/2017 Patient underwent breast reconstruction with expanders.  Neratinib toxicities:  Severe anemia: Monitoring closely. Return to clinic in 2 months for follow-up

## 2017-11-30 ENCOUNTER — Other Ambulatory Visit: Payer: Self-pay

## 2017-11-30 ENCOUNTER — Telehealth: Payer: Self-pay

## 2017-11-30 MED ORDER — ONDANSETRON HCL 8 MG PO TABS: 8.0000 mg | ORAL_TABLET | Freq: Three times a day (TID) | ORAL | 0 refills | Status: DC | PRN

## 2017-11-30 NOTE — Telephone Encounter (Signed)
Spoke with pt today and reported that she has had nausea x1 week and decreased appetite x1 week. Pt on Nerlynx with side effects of diarrhea (controlled by lomotil). Pt denies dizziness,fever, palpitation and decreased output. She states that she lost 10lbs since she hasn't eaten in a week.   Per Dr.Gudena's approval, obtained order for zofran. Sent to preferred pharmacy.Pt to call if her symptoms worsen, or if she needs to see symptom management.

## 2017-12-04 ENCOUNTER — Telehealth: Payer: Self-pay

## 2017-12-04 NOTE — Telephone Encounter (Signed)
Pt called to report that she had broken out in a rash all over her R torso and her back. They are nonpainful, non itching, non draining, and not accompanied by fever or chills. Pt is on nerlynx, taking 4 tablets 160mg  daily. She has been taking this for a while and never noticed a rash before. The only symptom she has had with Nerlynx is the diarrhea, which is tolerable and controlled with [place]. Pt would like ot know if she needs to continue taking Nerlynx at this time.   Discussed with Dr.Gudena and he would like for pt to stop until symptoms resolved. Suggested to have pt come in to see symptom management today or tomorrow. She may also try out some benadryl 1-2 tablets every 4-6hrs as needed in the next 24hrs, to see if it resolves her rash. Pt would like to wait until tomorrow and will call if she needs to be seen. Pt verbalized understanding and will start benadryl today. She will call and report updates tomorrow.

## 2017-12-05 ENCOUNTER — Telehealth: Payer: Self-pay | Admitting: *Deleted

## 2017-12-05 NOTE — Telephone Encounter (Signed)
Pt called with c/o "rash all over" stopped taking Nerylex yesterday and started benadryl. Rash is not red nor does it itch per pt report. Pt wishes to stop medication. Informed Dr. Lindi Adie- he agrees with pt request. Scheduled pt to see Dr. Lindi Adie on 11/18.

## 2017-12-06 DIAGNOSIS — Z853 Personal history of malignant neoplasm of breast: Secondary | ICD-10-CM | POA: Diagnosis not present

## 2017-12-06 DIAGNOSIS — Z9013 Acquired absence of bilateral breasts and nipples: Secondary | ICD-10-CM | POA: Diagnosis not present

## 2017-12-06 DIAGNOSIS — Z923 Personal history of irradiation: Secondary | ICD-10-CM | POA: Diagnosis not present

## 2017-12-06 NOTE — H&P (Signed)
Subjective:     Patient ID: Laura Anthony is a 42 y.o. female.  HPI  Nearly 9 months post op bilateral mastectomies with immediate expander based reconstruction. Here for follow up discussion prior to planned implant exchange.  Presented with palpable mass left breast. Diagnostic MMG identified multiple lesions in the left breast. [place] noted to have 1.7 cm mass at 4:00 position, 5 mm mass 6:30 position an 11 mm mass at 6:00 position along with an enlarged LN. Biopsy of breast lesions with IDC, ER/PR -and HER-2 +. LN positive for metastatic disease.   Completed neoadjuvant chemotherapy. Completed 1 year Herceptin/Perjeta 9.12.19. Final MRI with no residual enhancement. Final pathology with residual 0.9 cm IDC, 0/3 SLN (targeted). Completed adjuvant radiation 5.22.19. Neratanib stopped this week due to rash.  Genetics negative. Mother with breast ca. Bone scan 09/11/2017: No evidence of metastatic disease.  Of note patient with history DVT 2017- completed Eliquis. States negative hypercoaguability w/u, thought due to OCP use.  Prior 36 B cup, desires what she was prior to pregnancy, a C cup. Right mastectomy 256 g Left 244 g  Now at home with family, previously worked as Librarian, academic for [employer]. Mother has passed, had breast cancer and underwent SSM with TE and implant based reconstruction.  Review of Systems     Objective:   Physical Exam  Cardiovascular: Normal rate, regular rhythm and normal heart sounds.  Pulmonary/Chest: Effort normal and breath sounds normal.  Abd: soft no hernias  Chest: chest soft bilateral left faint hyperpigmentation Right side rippling SN to nipple R 22 L 22.5 cm Nipple to IMF 7 L 7 cm Port right chest   Assessment:     Left breast ca LOQ ER-, Her2+ Neoadjuvant chemotherapy Family history breast ca S/p bilateral NSM with prepectoral TE/ADM (Alloderm) reconstruction Adjuvant radiation    Plan:     Plan approximately 6 months  post completion XRT to implant exchange, removal port, lipofilling to bilateral chest. Reviewed radiation significantly increases risk reconstruction including wound healing problems capsular contracture. Options include proceed with implant exchange and fat grafting, implant exchange with LD flap for radiated chest, or coversion to autologous. Patient has elected for for implant exchange alone.  Reviewed saline vs silicone. Counseled silicone may offer less visible rippling, recommend HCG or capacity filled silicone implants given prepectoral. Reviewed MRI or [place] surveillance with silicone implants for rupture. Reviewed breast implants are not permanent devices, may require additional surgery for rupture, contracture, infection. Plan smooth round capacity filled.  Discussed purpose fat grafting to minimize visible rippling, thicken mastectomy flaps. Discussed donor site pain and compression, reviewed variable take graft and fat necrosis that presents as masses or cysts. Asked her to purchase Spanx type garment for post op use. Plan donor site infra and supraumbilical abdomen.  Reviewed size guided by BW.   Plan OP surgery, no drains anticipated.  Reviewed additional risks including but not limited to seroma, hematoma, asymmetry, need for additional procedures, DVT/PE, damage to adjacent structures, cardiopulmonary complications.  Rx for oxycodone and Bactrim given.  Natrelle [id number]-11-T 300 ml tissue expanders placed bilateral,  Total  fill volume 300 ml saline   Irene Limbo, MD [facility] Plastic & Reconstructive Surgery [phone], pin [phone]

## 2017-12-16 DIAGNOSIS — J40 Bronchitis, not specified as acute or chronic: Secondary | ICD-10-CM | POA: Diagnosis not present

## 2017-12-16 DIAGNOSIS — J22 Unspecified acute lower respiratory infection: Secondary | ICD-10-CM | POA: Diagnosis not present

## 2017-12-18 ENCOUNTER — Inpatient Hospital Stay: Payer: 59 | Attending: Hematology and Oncology | Admitting: Hematology and Oncology

## 2017-12-18 DIAGNOSIS — Z9013 Acquired absence of bilateral breasts and nipples: Secondary | ICD-10-CM | POA: Insufficient documentation

## 2017-12-18 DIAGNOSIS — Z923 Personal history of irradiation: Secondary | ICD-10-CM

## 2017-12-18 DIAGNOSIS — Z171 Estrogen receptor negative status [ER-]: Secondary | ICD-10-CM | POA: Diagnosis not present

## 2017-12-18 DIAGNOSIS — Z9221 Personal history of antineoplastic chemotherapy: Secondary | ICD-10-CM | POA: Diagnosis not present

## 2017-12-18 DIAGNOSIS — Z79899 Other long term (current) drug therapy: Secondary | ICD-10-CM | POA: Insufficient documentation

## 2017-12-18 DIAGNOSIS — C50512 Malignant neoplasm of lower-outer quadrant of left female breast: Secondary | ICD-10-CM | POA: Insufficient documentation

## 2017-12-18 NOTE — Assessment & Plan Note (Addendum)
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 Neoadjuvant chemotherapy with TCHPcompleted 02/17/2017 this would be followed by Herceptinand Perjetamaintenance for 1 year completed September 2019 2.Bilateral mastectomies 03/28/2016:Bilateral mastectomies: Left mastectomy: IDC grade 2 0.9 cm, nodes negative, right mastectomy benign, ER 0%, PR 0%, HER-2 positive ratio 2.6 3.Adjuvant radiation4/08/2017 to 06/09/2017 ---------------------------------------------------------------------------- Current treatment:Neratinib started 10/12/2017 Patient underwent breast reconstruction with expanders.  Neratinib toxicities: Profound diarrhea:  Return to clinic in 2 months for follow up

## 2017-12-18 NOTE — Progress Notes (Signed)
Patient Care Team: [name], MD as PCP - General (Family Medicine)  DIAGNOSIS:  Encounter Diagnosis  Name Primary?  . Malignant neoplasm of lower-outer quadrant of left breast of female, estrogen receptor negative ([place])     SUMMARY OF ONCOLOGIC HISTORY:   Malignant neoplasm of lower-outer quadrant of left breast of female, estrogen receptor negative ([place])   10/20/2016 Mammogram    Mammogram and ultrasound of the left breast revealed 1.7 cm mass at 4:00 position, 6:30 position 5 x 4 x 4 mm mass, 6:00 position 5 cm nipple 7 x 6 x 11 mm, left axillary lymph node with thickened cortex, T1c N1 stage II a AJCC 8    10/24/2016 Initial Diagnosis    Left breast biopsy 6:30 position 3 cm from nipple: IDC grade 2, DCIS, ER 0%, PR 0%, Ki-67 15% HER-2 positive ratio 2.1; 4:00 position 3 cm from nipple: IDC grade 2, DCIS, ER 0%, PR 0%, Ki-67 35%, HER-2 positive ratio 2.02; left axillary lymph node biopsy positive    11/04/2016 - 02/17/2017 Neo-Adjuvant Chemotherapy    TCH Perjeta 6 cycles followed by Herceptin + Perjeta maintenance to be completed September 2019    11/30/2016 Genetic Testing    Negative genetic testing on the common hereditary cancer panel.  The Hereditary Gene Panel offered by [facility] includes sequencing and/or deletion duplication testing of the following 47 genes: APC, ATM, AXIN2, BARD1, BMPR1A, BRCA1, BRCA2, BRIP1, CDH1, CDK4, CDKN2A (p14ARF), CDKN2A (p16INK4a), CHEK2, CTNNA1, DICER1, EPCAM (Deletion/duplication testing only), GREM1 (promoter region deletion/duplication testing only), KIT, MEN1, MLH1, MSH2, MSH3, MSH6, MUTYH, NBN, NF1, NHTL1, PALB2, PDGFRA, PMS2, POLD1, POLE, PTEN, RAD50, RAD51C, RAD51D, SDHB, SDHC, SDHD, SMAD4, SMARCA4. STK11, TP53, TSC1, TSC2, and VHL.  The following genes were evaluated for sequence changes only: SDHA and HOXB13 c.251G>A variant only. The report date is November 30, 2016.     03/27/2017 Surgery    Bilateral mastectomies: Left mastectomy:  IDC grade 2 0.9 cm, nodes negative, right mastectomy benign, ER 0%, PR 0%, HER-2 positive ratio 2.6    05/08/2017 - 06/09/2017 Radiation Therapy    Adjuvant radiation therapy    10/23/2017 Miscellaneous    Neratinib discontinued after 4 weeks for severe diarrhea     CHIEF COMPLIANT: Follow-up after discontinuation of Neratinib  INTERVAL HISTORY: Laura Anthony is a 42 year old with above-mentioned history of bilateral mastectomies for HER-2 positive left breast cancer currently on surveillance.  She tried Neratinib but she could not tolerate it because of severe diarrhea in spite of using multiple antidiarrheal medications.  She is doing a lot better since she stopped it.  She has recent diagnosis of bronchitis for which she is currently on antibiotic and prednisone.  REVIEW OF SYSTEMS:   Constitutional: Denies fevers, chills or abnormal weight loss Eyes: Denies blurriness of vision Ears, nose, mouth, throat, and face: Denies mucositis or sore throat Respiratory: Bronchitis Cardiovascular: Denies palpitation, chest discomfort Gastrointestinal:  Denies nausea, heartburn or change in bowel habits Skin: Denies abnormal skin rashes Lymphatics: Denies new lymphadenopathy or easy bruising Neurological:Denies numbness, tingling or new weaknesses Behavioral/Psych: Mood is stable, no new changes  Extremities: No lower extremity edema   All other systems were reviewed with the patient and are negative.  I have reviewed the past medical history, past surgical history, social history and family history with the patient and they are unchanged from previous note.  ALLERGIES:  is allergic to sumatriptan and statins.  MEDICATIONS:  Current Outpatient Medications  Medication Sig Dispense Refill  .  FLUoxetine (PROZAC) 20 MG capsule Take 20 mg by mouth every morning.    Marland Kitchen LORazepam (ATIVAN) 1 MG tablet Take 1 tablet (1 mg total) by mouth daily. (Patient taking differently: Take 1 mg by mouth  daily as needed for sleep. ) 30 tablet 2  . Nutritional Supplements (JUICE PLUS FIBRE PO) Take 2 each by mouth daily. [employer]    . Nutritional Supplements (JUICE PLUS FIBRE PO) Take 2 each by mouth daily. [employer]    . rizatriptan (MAXALT) 10 MG tablet Take 10 mg by mouth daily as needed for migraine. May repeat in 2 hours if needed      No current facility-administered medications for this visit.     PHYSICAL EXAMINATION: ECOG PERFORMANCE STATUS: 1 - Symptomatic but completely ambulatory  Vitals:   12/18/17 1536  BP: 124/80  Pulse: 71  Resp: 18  Temp: 98.8 F (37.1 C)  SpO2: 99%   Filed Weights   12/18/17 1536  Weight: 160 lb 1.6 oz (72.6 kg)    GENERAL:alert, no distress and comfortable SKIN: skin color, texture, turgor are normal, no rashes or significant lesions EYES: normal, Conjunctiva are pink and non-injected, sclera clear OROPHARYNX:no exudate, no erythema and lips, buccal mucosa, and tongue normal  NECK: supple, thyroid normal size, non-tender, without nodularity LYMPH:  no palpable lymphadenopathy in the cervical, axillary or inguinal LUNGS: clear to auscultation and percussion with normal breathing effort HEART: regular rate & rhythm and no murmurs and no lower extremity edema ABDOMEN:abdomen soft, non-tender and normal bowel sounds MUSCULOSKELETAL:no cyanosis of digits and no clubbing  NEURO: alert & oriented x 3 with fluent speech, no focal motor/sensory deficits EXTREMITIES: No lower extremity edema   LABORATORY DATA:  I have reviewed the data as listed CMP Latest Ref Rng & Units 11/21/2017 11/10/2017 10/12/2017  Glucose 70 - 99 mg/dL 80 97 96  BUN 6 - 20 mg/dL 12 13 18   Creatinine 0.44 - 1.00 mg/dL 0.83 0.81 0.87  Sodium 135 - 145 mmol/L 139 138 139  Potassium 3.5 - 5.1 mmol/L 4.3 4.0 4.1  Chloride 98 - 111 mmol/L 106 107 106  CO2 22 - 32 mmol/L 25 24 26   Calcium 8.9 - 10.3 mg/dL 9.2 8.9 9.0  Total Protein 6.5 - 8.1 g/dL 7.4 7.2 7.2  Total  Bilirubin 0.3 - 1.2 mg/dL 0.3 0.2(L) 0.4  Alkaline Phos 38 - 126 U/L 99 105 103  AST 15 - 41 U/L 17 18 16   ALT 0 - 44 U/L 14 12 11     Lab Results  Component Value Date   WBC 6.2 11/21/2017   HGB 11.9 (L) 11/21/2017   HCT 37.4 11/21/2017   MCV 90.6 11/21/2017   PLT 214 11/21/2017   NEUTROABS 4.4 11/21/2017    ASSESSMENT & PLAN:  Malignant neoplasm of lower-outer quadrant of left breast of female, estrogen receptor negative ([facility]) Mammogram and ultrasound of the left breast revealed 1.7 cm mass at 4:00 position, 6:30 position 5 x 4 x 4 mm mass, 6:00 position 5 cm nipple 7 x 6 x 11 mm, left axillary lymph node with thickened cortex, T1c N1 stage II a AJCC 8  10/24/2016: Left breast biopsy 6:30 position 3 cm from nipple: IDC grade 2, DCIS, ER 0%, PR 0%, 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 Neoadjuvant chemotherapy with TCHPcompleted  02/17/2017 this would be followed by Herceptinand Perjetamaintenance for 1 year completed September 2019 2.Bilateral mastectomies 03/28/2016:Bilateral mastectomies: Left mastectomy: IDC grade 2 0.9 cm, nodes negative, right mastectomy benign, ER 0%, PR 0%, HER-2 positive ratio 2.6 3.Adjuvant radiation4/08/2017 to 06/09/2017 ---------------------------------------------------------------------------- Current treatment:Neratinib started 10/12/2017 Patient underwent breast reconstruction with expanders.  Neratinib toxicities: Profound diarrhea led to discontinuation of Neratinib  Return to clinic in 6 months for follow up after that we can see her once a year    No orders of the defined types were placed in this encounter.  The patient has a good understanding of the overall plan. she agrees with it. she will call with any problems that may develop before the next visit here.   Harriette Ohara, MD 12/18/17

## 2017-12-19 ENCOUNTER — Telehealth: Payer: Self-pay | Admitting: Hematology and Oncology

## 2017-12-19 NOTE — Telephone Encounter (Signed)
Spoke with patient regarding her 6 month followup appt scheduled for 06/22/18 @ 11:15 am.

## 2017-12-22 DIAGNOSIS — C50819 Malignant neoplasm of overlapping sites of unspecified female breast: Secondary | ICD-10-CM | POA: Diagnosis not present

## 2017-12-25 ENCOUNTER — Encounter (HOSPITAL_BASED_OUTPATIENT_CLINIC_OR_DEPARTMENT_OTHER): Payer: Self-pay | Admitting: *Deleted

## 2017-12-25 ENCOUNTER — Other Ambulatory Visit: Payer: Self-pay

## 2018-01-04 NOTE — Progress Notes (Signed)
Ensure pre surgery drink given with instructions to complete by [place], surgical soap given with instructions, pt verbalized understanding.

## 2018-01-05 ENCOUNTER — Ambulatory Visit (HOSPITAL_BASED_OUTPATIENT_CLINIC_OR_DEPARTMENT_OTHER): Payer: 59 | Admitting: Anesthesiology

## 2018-01-05 ENCOUNTER — Ambulatory Visit (HOSPITAL_BASED_OUTPATIENT_CLINIC_OR_DEPARTMENT_OTHER)
Admission: RE | Admit: 2018-01-05 | Discharge: 2018-01-05 | Disposition: A | Discharge status: Home / Self Care | Payer: 59 | Source: Ambulatory Visit | Attending: Plastic Surgery | Admitting: Plastic Surgery

## 2018-01-05 ENCOUNTER — Encounter (HOSPITAL_BASED_OUTPATIENT_CLINIC_OR_DEPARTMENT_OTHER)
Admission: RE | Disposition: A | Discharge status: Home / Self Care | Payer: Self-pay | Source: Ambulatory Visit | Attending: Plastic Surgery

## 2018-01-05 ENCOUNTER — Encounter (HOSPITAL_BASED_OUTPATIENT_CLINIC_OR_DEPARTMENT_OTHER): Payer: Self-pay

## 2018-01-05 ENCOUNTER — Other Ambulatory Visit: Payer: Self-pay

## 2018-01-05 DIAGNOSIS — Z9221 Personal history of antineoplastic chemotherapy: Secondary | ICD-10-CM | POA: Diagnosis not present

## 2018-01-05 DIAGNOSIS — Z803 Family history of malignant neoplasm of breast: Secondary | ICD-10-CM | POA: Diagnosis not present

## 2018-01-05 DIAGNOSIS — Z452 Encounter for adjustment and management of vascular access device: Secondary | ICD-10-CM | POA: Insufficient documentation

## 2018-01-05 DIAGNOSIS — Z923 Personal history of irradiation: Secondary | ICD-10-CM | POA: Diagnosis not present

## 2018-01-05 DIAGNOSIS — Z1501 Genetic susceptibility to malignant neoplasm of breast: Secondary | ICD-10-CM | POA: Insufficient documentation

## 2018-01-05 DIAGNOSIS — Z853 Personal history of malignant neoplasm of breast: Secondary | ICD-10-CM | POA: Diagnosis not present

## 2018-01-05 DIAGNOSIS — Z9013 Acquired absence of bilateral breasts and nipples: Secondary | ICD-10-CM | POA: Insufficient documentation

## 2018-01-05 DIAGNOSIS — Z421 Encounter for breast reconstruction following mastectomy: Secondary | ICD-10-CM | POA: Diagnosis not present

## 2018-01-05 DIAGNOSIS — Z86718 Personal history of other venous thrombosis and embolism: Secondary | ICD-10-CM | POA: Insufficient documentation

## 2018-01-05 HISTORY — PX: REMOVAL OF BILATERAL TISSUE EXPANDERS WITH PLACEMENT OF BILATERAL BREAST IMPLANTS: SHX6431

## 2018-01-05 HISTORY — PX: PORT-A-CATH REMOVAL: SHX5289

## 2018-01-05 HISTORY — PX: LIPOSUCTION WITH LIPOFILLING: SHX6436

## 2018-01-05 SURGERY — REMOVAL PORT-A-CATH
Anesthesia: General | Site: Chest | Laterality: Right

## 2018-01-05 MED ORDER — SUFENTANIL CITRATE 50 MCG/ML IV SOLN
INTRAVENOUS | Status: AC
  Filled 2018-01-05: qty 1

## 2018-01-05 MED ORDER — EPINEPHRINE 30 MG/30ML IJ SOLN
INTRAMUSCULAR | Status: AC
  Filled 2018-01-05: qty 1

## 2018-01-05 MED ORDER — SODIUM CHLORIDE 0.9 % IV SOLN
INTRAVENOUS | Status: DC | PRN
  Administered 2018-01-05: 1000 mL

## 2018-01-05 MED ORDER — SUGAMMADEX SODIUM 200 MG/2ML IV SOLN
INTRAVENOUS | Status: DC | PRN
  Administered 2018-01-05: 150 mg via INTRAVENOUS

## 2018-01-05 MED ORDER — LIDOCAINE 2% (20 MG/ML) 5 ML SYRINGE
INTRAMUSCULAR | Status: AC
  Filled 2018-01-05: qty 5

## 2018-01-05 MED ORDER — ROCURONIUM BROMIDE 50 MG/5ML IV SOSY
PREFILLED_SYRINGE | INTRAVENOUS | Status: AC
  Filled 2018-01-05: qty 5

## 2018-01-05 MED ORDER — CEFAZOLIN SODIUM-DEXTROSE 2-4 GM/100ML-% IV SOLN
2.0000 g | INTRAVENOUS | Status: AC
  Administered 2018-01-05: 2 g via INTRAVENOUS

## 2018-01-05 MED ORDER — ROCURONIUM BROMIDE 100 MG/10ML IV SOLN
INTRAVENOUS | Status: DC | PRN
  Administered 2018-01-05: 50 mg via INTRAVENOUS

## 2018-01-05 MED ORDER — LIDOCAINE HCL (PF) 1 % IJ SOLN
INTRAMUSCULAR | Status: AC
  Filled 2018-01-05: qty 30

## 2018-01-05 MED ORDER — CHLORHEXIDINE GLUCONATE CLOTH 2 % EX PADS: 6.0000 | MEDICATED_PAD | Freq: Once | CUTANEOUS | Status: DC

## 2018-01-05 MED ORDER — MEPERIDINE HCL 25 MG/ML IJ SOLN: 6.2500 mg | INTRAMUSCULAR | Status: DC | PRN

## 2018-01-05 MED ORDER — FENTANYL CITRATE (PF) 100 MCG/2ML IJ SOLN: 50.0000 ug | INTRAMUSCULAR | Status: DC | PRN

## 2018-01-05 MED ORDER — EPHEDRINE 5 MG/ML INJ
INTRAVENOUS | Status: AC
  Filled 2018-01-05: qty 10

## 2018-01-05 MED ORDER — POVIDONE-IODINE 10 % EX SOLN
CUTANEOUS | Status: DC | PRN
  Administered 2018-01-05: 1 via TOPICAL

## 2018-01-05 MED ORDER — HYDROMORPHONE HCL 1 MG/ML IJ SOLN
INTRAMUSCULAR | Status: AC
  Filled 2018-01-05: qty 0.5

## 2018-01-05 MED ORDER — GABAPENTIN 300 MG PO CAPS
300.0000 mg | ORAL_CAPSULE | ORAL | Status: AC
  Administered 2018-01-05: 300 mg via ORAL

## 2018-01-05 MED ORDER — PROMETHAZINE HCL 25 MG/ML IJ SOLN: 6.2500 mg | INTRAMUSCULAR | Status: DC | PRN

## 2018-01-05 MED ORDER — CELECOXIB 200 MG PO CAPS
ORAL_CAPSULE | ORAL | Status: AC
  Filled 2018-01-05: qty 1

## 2018-01-05 MED ORDER — ONDANSETRON HCL 4 MG/2ML IJ SOLN
INTRAMUSCULAR | Status: DC | PRN
  Administered 2018-01-05: 4 mg via INTRAVENOUS

## 2018-01-05 MED ORDER — LIDOCAINE HCL (CARDIAC) PF 100 MG/5ML IV SOSY
PREFILLED_SYRINGE | INTRAVENOUS | Status: DC | PRN
  Administered 2018-01-05: 50 mg via INTRAVENOUS

## 2018-01-05 MED ORDER — CELECOXIB 200 MG PO CAPS
200.0000 mg | ORAL_CAPSULE | ORAL | Status: AC
  Administered 2018-01-05: 200 mg via ORAL

## 2018-01-05 MED ORDER — MIDAZOLAM HCL 2 MG/2ML IJ SOLN
1.0000 mg | INTRAMUSCULAR | Status: DC | PRN
  Administered 2018-01-05: 2 mg via INTRAVENOUS

## 2018-01-05 MED ORDER — PHENYLEPHRINE 40 MCG/ML (10ML) SYRINGE FOR IV PUSH (FOR BLOOD PRESSURE SUPPORT)
PREFILLED_SYRINGE | INTRAVENOUS | Status: AC
  Filled 2018-01-05: qty 10

## 2018-01-05 MED ORDER — MIDAZOLAM HCL 2 MG/2ML IJ SOLN: 0.5000 mg | Freq: Once | INTRAMUSCULAR | Status: DC | PRN

## 2018-01-05 MED ORDER — SCOPOLAMINE 1 MG/3DAYS TD PT72: 1.0000 | MEDICATED_PATCH | Freq: Once | TRANSDERMAL | Status: DC

## 2018-01-05 MED ORDER — HYDROMORPHONE HCL 1 MG/ML IJ SOLN
0.2500 mg | INTRAMUSCULAR | Status: DC | PRN
  Administered 2018-01-05 (×2): 0.5 mg via INTRAVENOUS

## 2018-01-05 MED ORDER — LACTATED RINGERS IV SOLN
INTRAVENOUS | Status: DC
  Administered 2018-01-05 (×2): via INTRAVENOUS

## 2018-01-05 MED ORDER — SUGAMMADEX SODIUM 200 MG/2ML IV SOLN
INTRAVENOUS | Status: AC
  Filled 2018-01-05: qty 2

## 2018-01-05 MED ORDER — SUFENTANIL CITRATE 50 MCG/ML IV SOLN
INTRAVENOUS | Status: DC | PRN
  Administered 2018-01-05: 10 ug via INTRAVENOUS
  Administered 2018-01-05: 20 ug via INTRAVENOUS

## 2018-01-05 MED ORDER — GABAPENTIN 300 MG PO CAPS
ORAL_CAPSULE | ORAL | Status: AC
  Filled 2018-01-05: qty 1

## 2018-01-05 MED ORDER — SODIUM BICARBONATE 4 % IV SOLN
INTRAVENOUS | Status: DC | PRN
  Administered 2018-01-05: 300 mL via INTRAMUSCULAR

## 2018-01-05 MED ORDER — SCOPOLAMINE 1 MG/3DAYS TD PT72
1.0000 | MEDICATED_PATCH | Freq: Once | TRANSDERMAL | Status: DC | PRN
  Administered 2018-01-05: 1.5 mg via TRANSDERMAL

## 2018-01-05 MED ORDER — PROPOFOL 500 MG/50ML IV EMUL
INTRAVENOUS | Status: DC | PRN
  Administered 2018-01-05: 12 ug/kg/min via INTRAVENOUS

## 2018-01-05 MED ORDER — SUCCINYLCHOLINE CHLORIDE 200 MG/10ML IV SOSY
PREFILLED_SYRINGE | INTRAVENOUS | Status: AC
  Filled 2018-01-05: qty 10

## 2018-01-05 MED ORDER — ONDANSETRON HCL 4 MG/2ML IJ SOLN
INTRAMUSCULAR | Status: AC
  Filled 2018-01-05: qty 2

## 2018-01-05 MED ORDER — BUPIVACAINE-EPINEPHRINE (PF) 0.25% -1:200000 IJ SOLN
INTRAMUSCULAR | Status: AC
  Filled 2018-01-05: qty 30

## 2018-01-05 MED ORDER — MIDAZOLAM HCL 2 MG/2ML IJ SOLN
INTRAMUSCULAR | Status: AC
  Filled 2018-01-05: qty 2

## 2018-01-05 MED ORDER — FENTANYL CITRATE (PF) 100 MCG/2ML IJ SOLN
INTRAMUSCULAR | Status: AC
  Filled 2018-01-05: qty 2

## 2018-01-05 MED ORDER — ACETAMINOPHEN 500 MG PO TABS
ORAL_TABLET | ORAL | Status: AC
  Filled 2018-01-05: qty 2

## 2018-01-05 MED ORDER — ACETAMINOPHEN 500 MG PO TABS
1000.0000 mg | ORAL_TABLET | ORAL | Status: AC
  Administered 2018-01-05: 1000 mg via ORAL

## 2018-01-05 MED ORDER — DEXAMETHASONE SODIUM PHOSPHATE 4 MG/ML IJ SOLN
INTRAMUSCULAR | Status: DC | PRN
  Administered 2018-01-05: 10 mg via INTRAVENOUS

## 2018-01-05 MED ORDER — PROPOFOL 10 MG/ML IV BOLUS
INTRAVENOUS | Status: DC | PRN
  Administered 2018-01-05: 150 mg via INTRAVENOUS

## 2018-01-05 MED ORDER — SCOPOLAMINE 1 MG/3DAYS TD PT72
MEDICATED_PATCH | TRANSDERMAL | Status: AC
  Filled 2018-01-05: qty 1

## 2018-01-05 MED ORDER — DEXAMETHASONE SODIUM PHOSPHATE 10 MG/ML IJ SOLN
INTRAMUSCULAR | Status: AC
  Filled 2018-01-05: qty 1

## 2018-01-05 MED ORDER — DIPHENHYDRAMINE HCL 50 MG/ML IJ SOLN
INTRAMUSCULAR | Status: DC | PRN
  Administered 2018-01-05: 6.25 mg via INTRAVENOUS

## 2018-01-05 MED ORDER — CEFAZOLIN SODIUM-DEXTROSE 2-4 GM/100ML-% IV SOLN
INTRAVENOUS | Status: AC
  Filled 2018-01-05: qty 100

## 2018-01-05 MED ORDER — EPHEDRINE SULFATE 50 MG/ML IJ SOLN
INTRAMUSCULAR | Status: DC | PRN
  Administered 2018-01-05 (×2): 10 mg via INTRAVENOUS

## 2018-01-05 SURGICAL SUPPLY — 97 items
BAG DECANTER FOR FLEXI CONT (MISCELLANEOUS) ×4 IMPLANT
BENZOIN TINCTURE PRP APPL 2/3 (GAUZE/BANDAGES/DRESSINGS) IMPLANT
BINDER ABDOMINAL 10 UNV 27-48 (MISCELLANEOUS) ×4 IMPLANT
BINDER ABDOMINAL 12 SM 30-45 (SOFTGOODS) IMPLANT
BINDER BREAST 3XL (GAUZE/BANDAGES/DRESSINGS) IMPLANT
BINDER BREAST LRG (GAUZE/BANDAGES/DRESSINGS) ×4 IMPLANT
BINDER BREAST MEDIUM (GAUZE/BANDAGES/DRESSINGS) IMPLANT
BINDER BREAST XLRG (GAUZE/BANDAGES/DRESSINGS) IMPLANT
BINDER BREAST XXLRG (GAUZE/BANDAGES/DRESSINGS) IMPLANT
BLADE CLIPPER SURG (BLADE) IMPLANT
BLADE SURG 10 STRL SS (BLADE) ×8 IMPLANT
BLADE SURG 11 STRL SS (BLADE) ×4 IMPLANT
BLADE SURG 15 STRL LF DISP TIS (BLADE) ×3 IMPLANT
BLADE SURG 15 STRL SS (BLADE) ×1
BNDG GAUZE ELAST 4 BULKY (GAUZE/BANDAGES/DRESSINGS) ×8 IMPLANT
CANISTER LIPO FAT HARVEST (MISCELLANEOUS) ×4 IMPLANT
CANISTER SUCT 1200ML W/VALVE (MISCELLANEOUS) ×4 IMPLANT
CHLORAPREP W/TINT 26ML (MISCELLANEOUS) ×8 IMPLANT
COVER BACK TABLE 60X90IN (DRAPES) ×4 IMPLANT
COVER MAYO STAND STRL (DRAPES) ×8 IMPLANT
COVER WAND RF STERILE (DRAPES) IMPLANT
DECANTER SPIKE VIAL GLASS SM (MISCELLANEOUS) IMPLANT
DERMABOND ADVANCED (GAUZE/BANDAGES/DRESSINGS) ×3
DERMABOND ADVANCED .7 DNX12 (GAUZE/BANDAGES/DRESSINGS) ×9 IMPLANT
DRAIN CHANNEL 15F RND FF W/TCR (WOUND CARE) IMPLANT
DRAPE TOP ARMCOVERS (MISCELLANEOUS) ×4 IMPLANT
DRAPE U-SHAPE 76X120 STRL (DRAPES) ×4 IMPLANT
DRSG PAD ABDOMINAL 8X10 ST (GAUZE/BANDAGES/DRESSINGS) ×16 IMPLANT
ELECT BLADE 4.0 EZ CLEAN MEGAD (MISCELLANEOUS) ×4
ELECT COATED BLADE 2.86 ST (ELECTRODE) ×4 IMPLANT
ELECT REM PT RETURN 9FT ADLT (ELECTROSURGICAL) ×4
ELECTRODE BLDE 4.0 EZ CLN MEGD (MISCELLANEOUS) ×3 IMPLANT
ELECTRODE REM PT RTRN 9FT ADLT (ELECTROSURGICAL) ×3 IMPLANT
EVACUATOR SILICONE 100CC (DRAIN) IMPLANT
GAUZE SPONGE 4X4 12PLY STRL LF (GAUZE/BANDAGES/DRESSINGS) IMPLANT
GLOVE BIO SURGEON STRL SZ 6 (GLOVE) ×16 IMPLANT
GLOVE BIOGEL M STRL SZ7.5 (GLOVE) ×8 IMPLANT
GLOVE BIOGEL PI IND STRL 7.0 (GLOVE) ×3 IMPLANT
GLOVE BIOGEL PI IND STRL 8 (GLOVE) ×3 IMPLANT
GLOVE BIOGEL PI INDICATOR 7.0 (GLOVE) ×1
GLOVE BIOGEL PI INDICATOR 8 (GLOVE) ×1
GOWN STRL REUS W/ TWL LRG LVL3 (GOWN DISPOSABLE) ×6 IMPLANT
GOWN STRL REUS W/ TWL XL LVL3 (GOWN DISPOSABLE) ×3 IMPLANT
GOWN STRL REUS W/TWL LRG LVL3 (GOWN DISPOSABLE) ×2
GOWN STRL REUS W/TWL XL LVL3 (GOWN DISPOSABLE) ×1
IMPL BREAST SILICONE 470CC (Breast) ×6 IMPLANT
IMPLANT BREAST SILICONE 470CC (Breast) ×8 IMPLANT
IV NS 500ML (IV SOLUTION)
IV NS 500ML BAXH (IV SOLUTION) IMPLANT
KIT FILL SYSTEM UNIVERSAL (SET/KITS/TRAYS/PACK) IMPLANT
LINER CANISTER 1000CC FLEX (MISCELLANEOUS) ×4 IMPLANT
MARKER SKIN DUAL TIP RULER LAB (MISCELLANEOUS) IMPLANT
NDL SAFETY ECLIPSE 18X1.5 (NEEDLE) ×3 IMPLANT
NEEDLE FILTER BLUNT 18X 1/2SAF (NEEDLE) ×1
NEEDLE FILTER BLUNT 18X1 1/2 (NEEDLE) ×3 IMPLANT
NEEDLE HYPO 18GX1.5 SHARP (NEEDLE) ×1
NEEDLE HYPO 25X1 1.5 SAFETY (NEEDLE) IMPLANT
NEEDLE HYPO 30GX1 BEV (NEEDLE) IMPLANT
NEEDLE PRECISIONGLIDE 27X1.5 (NEEDLE) IMPLANT
NS IRRIG 1000ML POUR BTL (IV SOLUTION) ×4 IMPLANT
PACK BASIN DAY SURGERY FS (CUSTOM PROCEDURE TRAY) ×4 IMPLANT
PAD ALCOHOL SWAB (MISCELLANEOUS) ×4 IMPLANT
PENCIL BUTTON HOLSTER BLD 10FT (ELECTRODE) ×4 IMPLANT
PIN SAFETY STERILE (MISCELLANEOUS) IMPLANT
PUNCH BIOPSY DERMAL 4MM (MISCELLANEOUS) IMPLANT
SHEET MEDIUM DRAPE 40X70 STRL (DRAPES) ×4 IMPLANT
SIZER BREAST REUSE 445CC (SIZER) ×4
SIZER BREAST REUSE 470CC (SIZER) ×4
SIZER BRST REUSE 445CC (SIZER) ×3 IMPLANT
SIZER BRST REUSE 470CC (SIZER) ×3 IMPLANT
SLEEVE SCD COMPRESS KNEE MED (MISCELLANEOUS) ×4 IMPLANT
SPONGE GAUZE 2X2 8PLY STRL LF (GAUZE/BANDAGES/DRESSINGS) IMPLANT
SPONGE LAP 18X18 RF (DISPOSABLE) ×8 IMPLANT
STAPLER VISISTAT 35W (STAPLE) ×4 IMPLANT
STRIP CLOSURE SKIN 1/2X4 (GAUZE/BANDAGES/DRESSINGS) IMPLANT
SUT ETHILON 2 0 FS 18 (SUTURE) IMPLANT
SUT MNCRL AB 4-0 PS2 18 (SUTURE) ×8 IMPLANT
SUT PDS AB 2-0 CT2 27 (SUTURE) ×4 IMPLANT
SUT VIC AB 3-0 PS1 18 (SUTURE)
SUT VIC AB 3-0 PS1 18XBRD (SUTURE) IMPLANT
SUT VIC AB 3-0 SH 27 (SUTURE) ×2
SUT VIC AB 3-0 SH 27X BRD (SUTURE) ×6 IMPLANT
SUT VICRYL 4-0 PS2 18IN ABS (SUTURE) ×8 IMPLANT
SYR 10ML LL (SYRINGE) ×16 IMPLANT
SYR 20CC LL (SYRINGE) ×4 IMPLANT
SYR 50ML LL SCALE MARK (SYRINGE) ×12 IMPLANT
SYR BULB 3OZ (MISCELLANEOUS) IMPLANT
SYR BULB IRRIGATION 50ML (SYRINGE) ×8 IMPLANT
SYR CONTROL 10ML LL (SYRINGE) ×4 IMPLANT
SYR TB 1ML LL NO SAFETY (SYRINGE) ×4 IMPLANT
TOWEL GREEN STERILE FF (TOWEL DISPOSABLE) ×8 IMPLANT
TRAY DSU PREP LF (CUSTOM PROCEDURE TRAY) IMPLANT
TUBE CONNECTING 20X1/4 (TUBING) ×8 IMPLANT
TUBING INFILTRATION IT-10001 (TUBING) ×4 IMPLANT
TUBING SET GRADUATE ASPIR 12FT (MISCELLANEOUS) ×4 IMPLANT
UNDERPAD 30X30 (UNDERPADS AND DIAPERS) ×8 IMPLANT
YANKAUER SUCT BULB TIP NO VENT (SUCTIONS) ×4 IMPLANT

## 2018-01-05 NOTE — Discharge Instructions (Signed)
° ° °  NO TYLENOL BEFORE 1 PM TODAY ! ° ° ° ° ° °Post Anesthesia Home Care Instructions ° °Activity: °Get plenty of rest for the remainder of the day. A responsible individual must stay with you for 24 hours following the procedure.  °For the next 24 hours, DO NOT: °-Drive a car °-Operate machinery °-Drink alcoholic beverages °-Take any medication unless instructed by your physician °-Make any legal decisions or sign important papers. ° °Meals: °Start with liquid foods such as gelatin or soup. Progress to regular foods as tolerated. Avoid greasy, spicy, heavy foods. If nausea and/or vomiting occur, drink only clear liquids until the nausea and/or vomiting subsides. Call your physician if vomiting continues. ° °Special Instructions/Symptoms: °Your throat may feel dry or sore from the anesthesia or the breathing tube placed in your throat during surgery. If this causes discomfort, gargle with warm salt water. The discomfort should disappear within 24 hours. ° °If you had a scopolamine patch placed behind your ear for the management of post- operative nausea and/or vomiting: ° °1. The medication in the patch is effective for 72 hours, after which it should be removed.  Wrap patch in a tissue and discard in the trash. Wash hands thoroughly with soap and water. °2. You may remove the patch earlier than 72 hours if you experience unpleasant side effects which may include dry mouth, dizziness or visual disturbances. °3. Avoid touching the patch. Wash your hands with soap and water after contact with the patch. °   ° ° °

## 2018-01-05 NOTE — Op Note (Signed)
Operative Note   DATE OF OPERATION: 12.6.2019  LOCATION: [facility]-outpatient  SURGICAL DIVISION: Plastic Surgery  PREOPERATIVE DIAGNOSES:  1. History breast cancer 2. Acquired absence breasts 3.History therapeutic radiation   POSTOPERATIVE DIAGNOSES:  same  PROCEDURE:  1. Removal right chest port 2. Removal bilateral chest tissue expanders and placement silicone implants 3. Lipofilling to bilateral chest  SURGEON: Irene Limbo MD MBA  ASSISTANT: none  ANESTHESIA:  General.   EBL: 15 ml  COMPLICATIONS: None immediate.   INDICATIONS FOR PROCEDURE:  The patient, Laura Anthony, is a 42 y.o. female born on 1975-08-09, is here for staged breast reconstruction following bilateral nipple sparing mastectomies with immediate prepectoral expander ADM reconstruction.   FINDINGS: Complete incorporation ADM bilateral. 55 ml fat infiltrated over right chest, 45 ml fat over left chest. Natrelle Inspira Smooth Round Extra Projection 470 ml implants infiltrated bilateral REF [id number] RIGHT SN [id number] LEFT SN [id number]  DESCRIPTION OF PROCEDURE:  The patient's operative site was marked with the patient in the preoperative area including infra and supraumbilical abdomen for liposuction. The patient was taken to the operating room. SCDs were placed and IV antibiotics were given. The patient's operative site was prepped and draped in a sterile fashion. A time out was performed and all information was confirmed to be correct. Incision made in scar over port. Incision carried through superficial fascia and capsule. Sutures removed and port removed intact. Closure completed with 4-0 vicryl in dermis and superficial fascia followed by 4-0 monocryl subcuticular skin closure.  Incision made in right inframammary fold scar and carried through superficial fascia and ADM. Expander removed. Capsulotomies performed medially and superiorly. Asymmetry of IMF noted with right being higher. The superficial  fascia of caudal IMF incision was sutured to chest wall with 2-0 PDS to stabilize desired IMF. Sizer placed.  Incision made in left inframammary fold scar and carried through superficial fascia and ADM. Expander removed. Full incorporation ADM noted. Capsulotomies performed superiorly. Right mastectomy flap thickness noted to be thinner than left. Sizer placed. Patient brought to upright sitting position and assessed for symmetry. Natrelle Inspira Extra Projection 470 ml implant selected for bilateral placement. Patient returned to supine position.  Stab incision made over bilateralabdomen. Tumescent fluid infiltrated over supra and infraumbilical [id number] tumescent infiltrated. Power assisted liposuction performed to endpoint symmetric contour and soft tissue thickness, total lipoaspirate384ml. The fat was then washed and prepared by gravity for infiltration. Harvested fat was then infiltrated in subcutaneous plane throughout total envelope mastectomy flaps.  Each cavity irrigated with bacitracin, Ancef, gentamicinsolution.Hemostasis ensured.Each cavity then irrigated with Betadine. The implant was placed inrightchest andimplant orientation ensured. Closure completed with 3-0 vicryl to closesuperficial fascia and ADMover implant.4-0 vicryl used to close dermis followed by 4-0 monocryl subcuticular. Implant placed in leftchest cavity.Closure completedin similar fashion. Tissue adhesive and dry dressing applied, followed by breastbinder. Abdominal incisions closed with simple suture 4-0 monocryl.  The patient was allowed to wake from anesthesia, extubated and taken to the recovery room in satisfactory condition.   SPECIMENS: none  DRAINS: none  Irene Limbo, MD [facility] Plastic & Reconstructive Surgery [phone], pin [phone]

## 2018-01-05 NOTE — Anesthesia Procedure Notes (Signed)

## 2018-01-05 NOTE — Interval H&P Note (Signed)
History and Physical Interval Note:  01/05/2018 6:56 AM  Laura Anthony  has presented today for surgery, with the diagnosis of history breast cancer, acquired absence breasts, history therapeutic radiation  The various methods of treatment have been discussed with the patient and family. After consideration of risks, benefits and other options for treatment, the patient has consented to removal bilateral tissue expanders and placement silicone implants, lipofilling from abdomen to bilateral chest, removal right chest port as a surgical intervention .  The patient's history has been reviewed, patient examined, no change in status, stable for surgery.  I have reviewed the patient's chart and labs.  Questions were answered to the patient's satisfaction.     Arnoldo Hooker Mylynn Dinh

## 2018-01-05 NOTE — Anesthesia Postprocedure Evaluation (Signed)
Anesthesia Post Note  Patient: Laura Anthony  Procedure(s) Performed: REMOVAL RIGHT CHEST PORT (Right Chest) REMOVAL OF BILATERAL TISSUE EXPANDERS WITH PLACEMENT OF BILATERAL BREAST IMPLANTS (Bilateral Breast) LIPOFILLING FROM ABDOMEN TO BILATERAL CHEST (Bilateral Chest)     Patient location during evaluation: PACU Anesthesia Type: General Level of consciousness: awake and alert, oriented and patient cooperative Pain management: pain level controlled Vital Signs Assessment: post-procedure vital signs reviewed and stable Respiratory status: spontaneous breathing, nonlabored ventilation and respiratory function stable Cardiovascular status: blood pressure returned to baseline and stable Postop Assessment: no apparent nausea or vomiting and adequate PO intake Anesthetic complications: no    Last Vitals:  Vitals:   01/05/18 1100 01/05/18 1130  BP: 118/74 122/77  Pulse: 84 85  Resp: 17 18  Temp:  36.7 C  SpO2: 100% 98%    Last Pain:  Vitals:   01/05/18 1130  TempSrc:   PainSc: 3                  Darin Arndt,E. Keymani Glynn

## 2018-01-05 NOTE — Transfer of Care (Signed)
Immediate Anesthesia Transfer of Care Note  Patient: Laura Anthony  Procedure(s) Performed: REMOVAL RIGHT CHEST PORT (Right Chest) REMOVAL OF BILATERAL TISSUE EXPANDERS WITH PLACEMENT OF BILATERAL BREAST IMPLANTS (Bilateral Breast) LIPOFILLING FROM ABDOMEN TO BILATERAL CHEST (Bilateral Chest)  Patient Location: PACU  Anesthesia Type:General  Level of Consciousness: awake, alert , oriented and drowsy  Airway & Oxygen Therapy: Patient Spontanous Breathing and Patient connected to face mask oxygen  Post-op Assessment: Report given to RN and Post -op Vital signs reviewed and stable  Post vital signs: Reviewed and stable  Last Vitals:  Vitals Value Taken Time  BP    Temp    Pulse    Resp    SpO2      Last Pain:  Vitals:   01/05/18 0638  TempSrc: Oral         Complications: No apparent anesthesia complications

## 2018-01-05 NOTE — Anesthesia Preprocedure Evaluation (Addendum)
Anesthesia Evaluation  Patient identified by MRN, date of birth, ID band Patient awake    Reviewed: Allergy & Precautions, NPO status , Patient's Chart, lab work & pertinent test results  History of Anesthesia Complications (+) PONV  Airway Mallampati: I  TM Distance: >3 FB Neck ROM: Full    Dental  (+) Dental Advisory Given   Pulmonary neg pulmonary ROS,    breath sounds clear to auscultation       Cardiovascular (-) angina+ DVT (2017)   Rhythm:Regular Rate:Normal  4/19 ECHO: EF 60-65%, valves OK   Neuro/Psych  Headaches, PSYCHIATRIC DISORDERS (ADD) Anxiety    GI/Hepatic negative GI ROS, Neg liver ROS,   Endo/Other  negative endocrine ROS  Renal/GU negative Renal ROS     Musculoskeletal   Abdominal   Peds  Hematology negative hematology ROS (+)   Anesthesia Other Findings   Reproductive/Obstetrics LMP 1 week ago                            Anesthesia Physical Anesthesia Plan  ASA: II  Anesthesia Plan: General   Post-op Pain Management:    Induction: Intravenous  PONV Risk Score and Plan: 4 or greater and Scopolamine patch - Pre-op, Propofol infusion, Dexamethasone, Ondansetron and Diphenhydramine  Airway Management Planned: Oral ETT  Additional Equipment:   Intra-op Plan:   Post-operative Plan: Extubation in OR  Informed Consent: I have reviewed the patients History and Physical, chart, labs and discussed the procedure including the risks, benefits and alternatives for the proposed anesthesia with the patient or authorized representative who has indicated his/her understanding and acceptance.   Dental advisory given  Plan Discussed with: CRNA and Surgeon  Anesthesia Plan Comments: (Plan routine monitors, GETA)        Anesthesia Quick Evaluation

## 2018-01-08 ENCOUNTER — Encounter (HOSPITAL_BASED_OUTPATIENT_CLINIC_OR_DEPARTMENT_OTHER): Payer: Self-pay | Admitting: Plastic Surgery

## 2018-01-19 ENCOUNTER — Inpatient Hospital Stay: Payer: 59 | Admitting: Hematology and Oncology

## 2018-01-19 ENCOUNTER — Inpatient Hospital Stay: Payer: 59

## 2018-01-26 ENCOUNTER — Encounter: Payer: Self-pay | Admitting: Hematology and Oncology

## 2018-01-29 ENCOUNTER — Other Ambulatory Visit: Payer: Self-pay

## 2018-01-29 ENCOUNTER — Telehealth: Payer: Self-pay

## 2018-01-29 DIAGNOSIS — C50512 Malignant neoplasm of lower-outer quadrant of left female breast: Secondary | ICD-10-CM

## 2018-01-29 DIAGNOSIS — Z171 Estrogen receptor negative status [ER-]: Secondary | ICD-10-CM

## 2018-01-29 NOTE — Progress Notes (Signed)
Pt scheduled for MRI brain w/wout contrast 02/06/2018.  Patient aware.  No further needs at this time.

## 2018-01-29 NOTE — Progress Notes (Signed)
Reviewed previous symptoms with Dr. Lindi Adie.  Prior to patient was able to call back to report BP of 95/63 and P of 80.  Dr. Lindi Adie recommends MRI Brain with and without contrast.  Nurse notified patient, patient voiced understanding and agreement.  Orders placed, will obtain PA.

## 2018-01-29 NOTE — Telephone Encounter (Signed)
Patient left voicemail to return call regarding HA X 2 weeks and tingling in left arm.   Nurse returned call.  Patient is having tingling in left arm "feels like pins and needles all over" X 1 week, along with HA X 2 weeks, mainly on left side behind left eye.  Patient is unable to take BP.  Denies chest pain, SOB, dizziness, and fevers.   Patient inquiring if this is delayed side effects from chemotherapy.  Nurse informed symptoms will be reviewed with provider.

## 2018-02-02 ENCOUNTER — Ambulatory Visit (HOSPITAL_COMMUNITY)
Admission: RE | Admit: 2018-02-02 | Discharge: 2018-02-02 | Disposition: A | Discharge status: Home / Self Care | Payer: 59 | Source: Ambulatory Visit | Attending: Hematology and Oncology | Admitting: Hematology and Oncology

## 2018-02-02 ENCOUNTER — Telehealth: Payer: Self-pay | Admitting: Hematology and Oncology

## 2018-02-02 DIAGNOSIS — C50512 Malignant neoplasm of lower-outer quadrant of left female breast: Secondary | ICD-10-CM

## 2018-02-02 DIAGNOSIS — C50919 Malignant neoplasm of unspecified site of unspecified female breast: Secondary | ICD-10-CM | POA: Diagnosis not present

## 2018-02-02 DIAGNOSIS — Z171 Estrogen receptor negative status [ER-]: Secondary | ICD-10-CM | POA: Insufficient documentation

## 2018-02-02 MED ORDER — GADOBUTROL 1 MMOL/ML IV SOLN
7.5000 mL | Freq: Once | INTRAVENOUS | Status: AC | PRN
  Administered 2018-02-02: 7 mL via INTRAVENOUS

## 2018-02-02 NOTE — Telephone Encounter (Signed)
I left a message for the patient to tell her that the brain MRI was normal

## 2018-02-06 ENCOUNTER — Other Ambulatory Visit: Payer: Self-pay

## 2018-02-06 MED ORDER — LORAZEPAM 1 MG PO TABS: 1.0000 mg | ORAL_TABLET | ORAL | 2 refills | Status: DC | PRN

## 2018-03-05 ENCOUNTER — Other Ambulatory Visit: Payer: 59

## 2018-04-02 DIAGNOSIS — Z79899 Other long term (current) drug therapy: Secondary | ICD-10-CM | POA: Diagnosis not present

## 2018-04-02 DIAGNOSIS — Z8042 Family history of malignant neoplasm of prostate: Secondary | ICD-10-CM | POA: Insufficient documentation

## 2018-04-02 DIAGNOSIS — Z803 Family history of malignant neoplasm of breast: Secondary | ICD-10-CM | POA: Insufficient documentation

## 2018-04-17 DIAGNOSIS — M79662 Pain in left lower leg: Secondary | ICD-10-CM | POA: Diagnosis not present

## 2018-05-29 DIAGNOSIS — M25521 Pain in right elbow: Secondary | ICD-10-CM | POA: Diagnosis not present

## 2018-05-31 DIAGNOSIS — M25521 Pain in right elbow: Secondary | ICD-10-CM | POA: Diagnosis not present

## 2018-06-04 DIAGNOSIS — L700 Acne vulgaris: Secondary | ICD-10-CM | POA: Diagnosis not present

## 2018-06-05 DIAGNOSIS — M25521 Pain in right elbow: Secondary | ICD-10-CM | POA: Diagnosis not present

## 2018-06-06 ENCOUNTER — Encounter: Payer: Self-pay | Admitting: Hematology and Oncology

## 2018-06-19 NOTE — Assessment & Plan Note (Signed)
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 Neoadjuvant chemotherapy with TCHPcompleted 02/17/2017 this would be followed by Herceptinand Perjetamaintenance for 1 yearcompleted September 2019 2.Bilateral mastectomies 03/28/2016:Bilateral mastectomies: Left mastectomy: IDC grade 2 0.9 cm, nodes negative, right mastectomy benign, ER 0%, PR 0%, HER-2 positive ratio 2.6 3.Adjuvant radiation4/08/2017 to 06/09/2017 4.  Neratinib started 10/12/2017 discontinued due to diarrhea ---------------------------------------------------------------------------- Breast cancer surveillance: No evidence of disease recurrence, no role of mammogram since she had bilateral mastectomies. Follow-up in 1 year for surveillance.

## 2018-06-21 ENCOUNTER — Telehealth: Payer: Self-pay | Admitting: Hematology and Oncology

## 2018-06-21 NOTE — Telephone Encounter (Signed)
Called patient regarding upcoming Webex appointment, patient is notified and e-mail has been sent. °

## 2018-06-21 NOTE — Progress Notes (Signed)
HEMATOLOGY-ONCOLOGY [place] VISIT PROGRESS NOTE  I connected with Laura Anthony on 06/22/2018 at 11:15 AM EDT by Webex video conference and verified that I am speaking with the correct person using two identifiers.  I discussed the limitations, risks, security and privacy concerns of performing an evaluation and management service by Webex and the availability of in person appointments.  I also discussed with the patient that there may be a patient responsible charge related to this service. The patient expressed understanding and agreed to proceed.  Patient's Location: Home Physician Location: Clinic  CHIEF COMPLIANT: Surveillance of breast cancer  INTERVAL HISTORY: Laura Anthony is a 43 y.o. 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 I last saw her 6 months ago. Brain MRI on 02/02/18 showed no evidence of malignancy. She underwent reconstruction on 01/05/18 with Dr. Iran Planas. She presents today over Webex for 57-monthfollow-up.  She denies any pain lumps or nodules in the chest wall or axilla.  She recently had a fracture of the medial epicondyle of the arm.  The MRI of the elbow did not show any evidence of cancer.    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  The Hereditary Gene Panel offered by [facility] includes sequencing and/or deletion duplication testing of the following 47 genes: APC, ATM, AXIN2, BARD1, BMPR1A, BRCA1, BRCA2, BRIP1, CDH1, CDK4, CDKN2A (p14ARF), CDKN2A (p16INK4a), CHEK2, CTNNA1, DICER1, EPCAM (Deletion/duplication testing only), GREM1 (promoter region deletion/duplication testing only), KIT, MEN1, MLH1, MSH2, MSH3, MSH6, MUTYH, NBN, NF1, NHTL1, PALB2, PDGFRA, PMS2, POLD1, POLE, PTEN, RAD50, RAD51C, RAD51D, SDHB, SDHC, SDHD, SMAD4, SMARCA4. STK11, TP53, TSC1, TSC2, and VHL.  The following genes were evaluated for sequence changes only: SDHA and HOXB13 c.251G>A variant only. The report date is November 30, 2016.     03/27/2017 Surgery    Bilateral mastectomies: Left mastectomy: IDC grade 2 0.9 cm, nodes negative, right mastectomy benign, ER 0%, PR 0%, HER-2 positive ratio 2.6    05/08/2017 - 06/09/2017 Radiation Therapy    Adjuvant radiation therapy    10/23/2017 Miscellaneous    Neratinib discontinued after 4 weeks for severe diarrhea     REVIEW OF SYSTEMS:   Constitutional: Denies fevers, chills or abnormal weight loss Eyes: Denies blurriness of vision Ears, nose, mouth, throat, and face: Denies mucositis or sore throat Respiratory: Denies cough, dyspnea or wheezes Cardiovascular: Denies palpitation, chest discomfort Gastrointestinal:  Denies nausea, heartburn or change in bowel habits Skin: Denies  abnormal skin rashes Lymphatics: Denies new lymphadenopathy or easy bruising Neurological:Denies numbness, tingling or new weaknesses Behavioral/Psych: Mood is stable, no new changes  Extremities: No lower extremity edema Breast: denies any pain or lumps or nodules  in either breasts All other systems were reviewed with the patient and are negative.  Observations/Objective:  There were no vitals filed for this visit. There is no height or weight on file to calculate BMI.  I have reviewed the data as listed CMP Latest Ref Rng & Units 11/21/2017 11/10/2017 10/12/2017  Glucose 70 - 99 mg/dL 80 97 96  BUN 6 - 20 mg/dL 12 13 18   Creatinine 0.44 - 1.00 mg/dL 0.83 0.81 0.87  Sodium 135 - 145 mmol/L 139 138 139  Potassium 3.5 - 5.1 mmol/L 4.3 4.0 4.1  Chloride 98 - 111 mmol/L 106 107 106  CO2 22 - 32 mmol/L 25 24 26   Calcium 8.9 - 10.3 mg/dL 9.2 8.9 9.0  Total Protein 6.5 - 8.1 g/dL 7.4 7.2 7.2  Total Bilirubin 0.3 - 1.2 mg/dL 0.3 0.2(L) 0.4  Alkaline Phos 38 - 126 U/L 99 105 103  AST 15 - 41 U/L 17 18 16   ALT 0 - 44 U/L 14 12 11     Lab Results  Component Value Date   WBC 6.2 11/21/2017   HGB 11.9 (L) 11/21/2017   HCT 37.4 11/21/2017   MCV 90.6 11/21/2017   PLT 214 11/21/2017   NEUTROABS 4.4 11/21/2017      Assessment Plan:  Malignant neoplasm of lower-outer quadrant of left breast of female, estrogen receptor negative ([place]) Mammogram and ultrasound of the left breast revealed 1.7 cm mass at 4:00 position, 6:30 position 5 x 4 x 4 mm mass, 6:00 position 5 cm nipple 7 x 6 x 11 mm, left axillary lymph node with thickened cortex, T1c N1 stage II a AJCC 8  10/24/2016: Left breast biopsy 6:30 position 3 cm from nipple: IDC grade 2, DCIS, ER 0%, PR 0%, 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 Neoadjuvant chemotherapy with TCHPcompleted 02/17/2017 this would be followed by Herceptinand Perjetamaintenance for 1 yearcompleted September 2019 2.Bilateral mastectomies 03/28/2016:Bilateral mastectomies: Left mastectomy: IDC grade 2 0.9 cm, nodes negative, right mastectomy benign, ER 0%, PR 0%,  HER-2 positive ratio 2.6 3.Adjuvant radiation4/08/2017 to 06/09/2017 4.  Neratinib started 10/12/2017 discontinued due to diarrhea ---------------------------------------------------------------------------- Breast cancer surveillance: No evidence of disease recurrence, no role of mammogram since she had bilateral mastectomies. Elbow fracture: recently, healing very well. She is enjoying her horses.  In fact we did the video call while she is tending to her horse. Follow-up in 1 year for surveillance.   I discussed the assessment and treatment plan with the patient. The patient was provided an opportunity to ask questions and all were answered. The patient agreed with the plan and demonstrated an understanding of the instructions. The patient was advised to call back or seek an in-person evaluation if the symptoms worsen or if the condition fails to improve as anticipated.   I provided 15 minutes of face-to-face Web Ex time during this encounter.    Rulon Eisenmenger, MD 06/22/2018

## 2018-06-22 ENCOUNTER — Inpatient Hospital Stay: Payer: 59 | Attending: Hematology and Oncology | Admitting: Hematology and Oncology

## 2018-06-22 DIAGNOSIS — C50512 Malignant neoplasm of lower-outer quadrant of left female breast: Secondary | ICD-10-CM | POA: Diagnosis not present

## 2018-06-22 DIAGNOSIS — Z9221 Personal history of antineoplastic chemotherapy: Secondary | ICD-10-CM

## 2018-06-22 DIAGNOSIS — Z171 Estrogen receptor negative status [ER-]: Secondary | ICD-10-CM

## 2018-06-22 DIAGNOSIS — Z923 Personal history of irradiation: Secondary | ICD-10-CM

## 2018-06-22 DIAGNOSIS — Z9013 Acquired absence of bilateral breasts and nipples: Secondary | ICD-10-CM

## 2018-06-26 ENCOUNTER — Telehealth: Payer: Self-pay | Admitting: Hematology and Oncology

## 2018-06-26 NOTE — Telephone Encounter (Signed)
Tried to reach regarding schedule °

## 2018-07-27 ENCOUNTER — Telehealth: Payer: Self-pay | Admitting: *Deleted

## 2018-07-27 NOTE — Telephone Encounter (Signed)
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 Pt relate Dr. Griffin Basil with [facility] called her with the results. Informed pt they made a referral to Dr. Harrold Donath at [facility].  Called [facility] and requested copy of report to be faxed to Dr. Lindi Adie for his review.

## 2018-07-30 ENCOUNTER — Telehealth: Payer: Self-pay | Admitting: *Deleted

## 2018-07-30 ENCOUNTER — Other Ambulatory Visit: Payer: Self-pay | Admitting: *Deleted

## 2018-07-30 DIAGNOSIS — C50919 Malignant neoplasm of unspecified site of unspecified female breast: Secondary | ICD-10-CM

## 2018-07-30 NOTE — Telephone Encounter (Signed)
Per Dr. Lindi Adie PET scan and Brain MRI ordered. Scheduled and confirmed with pt for 7/2 with arrival of 12:30 at [facility] radiology.

## 2018-08-02 ENCOUNTER — Encounter (HOSPITAL_COMMUNITY): Payer: 59

## 2018-08-02 ENCOUNTER — Ambulatory Visit (HOSPITAL_COMMUNITY)
Admission: RE | Admit: 2018-08-02 | Discharge: 2018-08-02 | Disposition: A | Discharge status: Home / Self Care | Payer: 59 | Source: Ambulatory Visit | Attending: Hematology and Oncology | Admitting: Hematology and Oncology

## 2018-08-02 ENCOUNTER — Telehealth: Payer: Self-pay | Admitting: *Deleted

## 2018-08-02 ENCOUNTER — Other Ambulatory Visit: Payer: Self-pay | Admitting: Hematology and Oncology

## 2018-08-02 ENCOUNTER — Other Ambulatory Visit: Payer: Self-pay

## 2018-08-02 DIAGNOSIS — C50919 Malignant neoplasm of unspecified site of unspecified female breast: Secondary | ICD-10-CM

## 2018-08-02 LAB — GLUCOSE, CAPILLARY: Glucose-Capillary: 93 mg/dL (ref 70–99)

## 2018-08-02 MED ORDER — GADOBUTROL 1 MMOL/ML IV SOLN
7.0000 mL | Freq: Once | INTRAVENOUS | Status: AC | PRN
  Administered 2018-08-02: 7 mL via INTRAVENOUS

## 2018-08-02 MED ORDER — FLUDEOXYGLUCOSE F - 18 (FDG) INJECTION
7.2700 | Freq: Once | INTRAVENOUS | Status: AC
  Administered 2018-08-02: 7.27 via INTRAVENOUS

## 2018-08-02 NOTE — Telephone Encounter (Signed)
Called pt and discussed brain MRI and PET scan results. Discussed next steps of bx lung nodule and send off for Caris testing. Will schedule pt to see Dr. Lindi Adie after lung bx to discuss pathology results and further treatment. Denies further questions at this time.

## 2018-08-06 ENCOUNTER — Ambulatory Visit: Payer: 59 | Admitting: Radiation Oncology

## 2018-08-06 ENCOUNTER — Ambulatory Visit: Payer: 59 | Admitting: Hematology and Oncology

## 2018-08-06 ENCOUNTER — Other Ambulatory Visit (HOSPITAL_COMMUNITY)
Admission: RE | Admit: 2018-08-06 | Discharge: 2018-08-06 | Disposition: A | Discharge status: Home / Self Care | Payer: 59 | Source: Ambulatory Visit | Attending: Hematology and Oncology | Admitting: Hematology and Oncology

## 2018-08-06 ENCOUNTER — Telehealth: Payer: Self-pay | Admitting: *Deleted

## 2018-08-06 DIAGNOSIS — Z1159 Encounter for screening for other viral diseases: Secondary | ICD-10-CM | POA: Insufficient documentation

## 2018-08-06 DIAGNOSIS — Z01812 Encounter for preprocedural laboratory examination: Secondary | ICD-10-CM | POA: Diagnosis present

## 2018-08-06 NOTE — Telephone Encounter (Signed)
Msg sent to Dr. [name] and Bryson Ha, [place] regarding appt for consult regarding xrt for pain/palliation for new met dz to right elbow. Discussed and confirmed with pt appt for 7/7 at 1:30pm to see Alison,PA and 3pm for SIM.

## 2018-08-07 ENCOUNTER — Other Ambulatory Visit: Payer: Self-pay | Admitting: Student

## 2018-08-07 ENCOUNTER — Ambulatory Visit
Admission: RE | Admit: 2018-08-07 | Discharge: 2018-08-07 | Disposition: A | Discharge status: Home / Self Care | Payer: 59 | Source: Ambulatory Visit | Attending: Radiation Oncology | Admitting: Radiation Oncology

## 2018-08-07 ENCOUNTER — Other Ambulatory Visit: Payer: Self-pay

## 2018-08-07 ENCOUNTER — Encounter: Payer: Self-pay | Admitting: *Deleted

## 2018-08-07 ENCOUNTER — Encounter: Payer: Self-pay | Admitting: Radiation Oncology

## 2018-08-07 ENCOUNTER — Other Ambulatory Visit: Payer: Self-pay | Admitting: Radiation Oncology

## 2018-08-07 DIAGNOSIS — C7951 Secondary malignant neoplasm of bone: Secondary | ICD-10-CM | POA: Insufficient documentation

## 2018-08-07 DIAGNOSIS — C50512 Malignant neoplasm of lower-outer quadrant of left female breast: Secondary | ICD-10-CM

## 2018-08-07 DIAGNOSIS — G893 Neoplasm related pain (acute) (chronic): Secondary | ICD-10-CM | POA: Insufficient documentation

## 2018-08-07 DIAGNOSIS — Z510 Encounter for antineoplastic radiation therapy: Secondary | ICD-10-CM | POA: Insufficient documentation

## 2018-08-07 DIAGNOSIS — Z171 Estrogen receptor negative status [ER-]: Secondary | ICD-10-CM | POA: Diagnosis not present

## 2018-08-07 LAB — SARS CORONAVIRUS 2 (TAT 6-24 HRS): SARS Coronavirus 2: NEGATIVE

## 2018-08-07 MED ORDER — PREDNISONE 10 MG PO TABS: ORAL_TABLET | ORAL | 0 refills | Status: DC

## 2018-08-07 NOTE — Progress Notes (Signed)
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 We discussed the option to give steroids for her symptoms. She is in agreement to try this with a prednisone taper. 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 I sent in a prescription for 60 mg per day for 5 days, then taper by 10 mg every 2 days.

## 2018-08-07 NOTE — Progress Notes (Signed)
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  Interval Since Last Radiation:  1 year 2 months  05/08/2017 - 06/21/2017: The patient initially received a dose of 50.4 Gy in 28 fractions to the left chest wall, supraclavicular region, and posterior axillary region. This was delivered using a 3-D conformal, 4 field technique. The patient then received a boost to the mastectomy scar. This delivered an additional 10 Gy in 5 fractions using an en face electron field. The total dose was 60.4 Gy.   Narrative:  [name] is a pleasant 43 y.o. female with a history of HER-2 amplified invasive ductal carcinoma of the left breast.  She received postmastectomy radiotherapy a little over a year ago to the left chest wall and regional nodes.  She had been followed after completing her [place] in surveillance, unfortunately in April she had a injury after falling from her horse and hurting her right arm.  She waited a few weeks before having and this evaluated as she felt that this was likely a bruise.  She was seen by orthopedics, and on 05/31/2018 an MRI of the right elbow was performed without contrast.This revealed at least an impaction fracture with stress injury of the medial epicondyle of the right humerus no adjacent ulnar nerve injury was identified.  She was followed and was planning to have reevaluation 6 weeks later but about 5 weeks post films, developed progressive pain which led to a repeat MRI on 07/25/2018.  This MRI revealed a 1 x 1.6 x 1.3 cm lesion that was destructive and enhancing in the medial condyle of the right humerus reaching the cortex of the posterior  lateral aspect of the medial epicondyle extending into the soft tissue with extensive marrow edema and enhancement of the medial epicondyle and adjacent tissues.  Edema and enhancement are seen about the ulnar nerve.  She underwent a PET scan on 08/02/2018 revealing bilateral hypermetabolic nodules in the lungs, multifocal lytic lesions in the bones, the largest of these was seen in the right inferior pubic rami measuring 1.3 cm, a tiny lytic lesion in the sacrum measuring 8 mm, T12 lesion measuring 6 mm, and a right 11th rib lesion measuring 6 mm.  She is seen to discuss options of palliative radiotherapy for the right medial epicondyle of the humerus.  She is planning to undergo a CT-guided biopsy of her lung nodule with the hopes of obtaining more biologic information on the behavior of her tumor to guide her systemic therapy.   On review of systems, the patient reports that she is having terrible pain in her right elbow.  This is really the only location of pain.  She is also noticed some functional changes in her menses.  She states that up until January she was having regular cycles.  Her husband has had a vasectomy, and the patient reports that there is no chance that she could be pregnant.  She had one episode of dyspareunia but denies any pain in the pelvis otherwise or in the hip on the right.  She denies any chest pain, shortness of breath, cough, fevers, chills, night sweats, unintended weight changes. She denies any bowel or  bladder disturbances, and denies abdominal pain, nausea or vomiting. She denies any new musculoskeletal or joint aches or pains, new skin lesions or concerns. A complete review of systems is obtained and is otherwise negative.  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   right   Breast cancer, left breast ([place])    S/P mastectomy  03/27/2017   DVT (deep venous thrombosis) ([place]) 2017   calf left - probably due to [facility] pills-took eliquis x3 mos, nonthing now   Family history of breast cancer    Family history of prostate cancer    High cholesterol    Impingement syndrome of right shoulder 07/2013   Migraine    "usually 1/month" (03/28/2017)   PONV (postoperative nausea and vomiting)    Right bicipital tenosynovitis 07/2013   Rotator cuff impingement syndrome of right shoulder 07/12/2013   Seizures ([place])    x 1 as a child - was never on anticonvulsants (03/28/2017)    Past Surgical History: Past Surgical History:  Procedure Laterality Date   ADENOIDECTOMY  1981   ANKLE ARTHROSCOPY Right    BREAST BIOPSY Left 10/2016   KNEE ARTHROSCOPY Right    KNEE ARTHROSCOPY W/ ACL RECONSTRUCTION Left    LIPOSUCTION WITH LIPOFILLING Bilateral 01/05/2018   Procedure: LIPOFILLING FROM ABDOMEN TO BILATERAL CHEST;  Surgeon: Irene Limbo, MD;  Location: [place];  Service: Plastics;  Laterality: Bilateral;   MASTECTOMY Left 03/27/2017   NIPPLE SPARING MASTECTOMY WITH RADIOACTIVE SEED TARGETED LYMPH NODE EXCISION AND LEFT AXILLARY SENTINEL LYMPH NODE BIOPSY   MASTECTOMY Right 03/27/2017   RIGHT PROPHYLACTIC NIPPLE SPARING MASTECTOMY   NIPPLE SPARING MASTECTOMY Right 03/27/2017   Procedure: RIGHT PROPHYLACTIC NIPPLE SPARING MASTECTOMY;  Surgeon: Rolm Bookbinder, MD;  Location: [place];  Service: General;  Laterality: Right;   PORT-A-CATH REMOVAL Right 01/05/2018   Procedure: REMOVAL RIGHT CHEST PORT;  Surgeon: Irene Limbo, MD;  Location: [place];  Service: Plastics;  Laterality: Right;   PORTACATH PLACEMENT N/A 11/01/2016   Procedure: INSERTION PORT-A-CATH WITH [place];  Surgeon: Rolm Bookbinder, MD;  Location: [place];  Service: General;  Laterality: N/A;   RADIOACTIVE SEED GUIDED AXILLARY SENTINEL LYMPH NODE Left 03/27/2017   Procedure: LEFT NIPPLE SPARING  MASTECTOMY WITH RADIOACTIVE SEED TARGETED LYMPH NODE EXCISION AND LEFT AXILLARY SENTINEL LYMPH NODE BIOPSY;  Surgeon: Rolm Bookbinder, MD;  Location: [place];  Service: General;  Laterality: Left;  REQUESTS RNFA   RECONSTRUCTION BREAST IMMEDIATE / DELAYED W/ TISSUE EXPANDER Bilateral 03/27/2017   BILATERAL BREAST RECONSTRUCTION WITH PLACEMENT OF TISSUE EXPANDER AND ALLODERM   REMOVAL OF BILATERAL TISSUE EXPANDERS WITH PLACEMENT OF BILATERAL BREAST IMPLANTS Bilateral 01/05/2018   Procedure: REMOVAL OF BILATERAL TISSUE EXPANDERS WITH PLACEMENT OF BILATERAL BREAST IMPLANTS;  Surgeon: Irene Limbo, MD;  Location: [place];  Service: Plastics;  Laterality: Bilateral;   SEPTOPLASTY WITH ETHMOIDECTOMY, AND MAXILLARY ANTROSTOMY  10/29/2010   bilat. max. antrostomy with left max. stripping; left ant. ethmoidectomy; right total ethmoidectomy; sphenoidotomy   SHOULDER ARTHROSCOPY WITH SUBACROMIAL DECOMPRESSION AND BICEP TENDON REPAIR Right 07/12/2013   Procedure: RIGHT SHOULDER ARTHROSCOPY DEBRIDEMENT EXTENSIVE DECOMPRESSION SUBACROMIAL PARTIAL ACROMIOPLASTY;  Surgeon: Johnny Bridge, MD;  Location: [place];  Service: Orthopedics;  Laterality: Right;   WRIST ARTHROSCOPY  01/17/2012   Procedure: ARTHROSCOPY WRIST; right wrist Surgeon:  Tennis Must, MD;  Location: [place];  Service: Orthopedics;  Laterality: Right;  RIGHT WRIST ARTHROSCOPY WITH TRIANGULAR FIBROCARTILAGE COMPLEX REPAIR AND DEBRIDEMENT     Social History:  Social History   Socioeconomic History   Marital status: Married    Spouse name: Not on file   Number of children: Not on file   Years of education: Not on file   Highest education level: Not on file  Occupational History   Not on file  Social Needs   Financial resource strain: Not on file   Food insecurity    Worry: Not on file    Inability: Not on file   Transportation needs    Medical: Not  on file    Non-medical: Not on file  Tobacco Use   Smoking status: Never Smoker   Smokeless tobacco: Never Used  Substance and Sexual Activity   Alcohol use: Yes    Comment: Drinks very rare   Drug use: No   Sexual activity: Yes    Birth control/protection: Surgical    Comment: husband has had a vasectomy  Lifestyle   Physical activity    Days per week: Not on file    Minutes per session: Not on file   Stress: Not on file  Relationships   Social connections    Talks on phone: Not on file    Gets together: Not on file    Attends religious service: Not on file    Active member of club or organization: Not on file    Attends meetings of clubs or organizations: Not on file    Relationship status: Not on file   Intimate partner violence    Fear of current or ex partner: Not on file    Emotionally abused: Not on file    Physically abused: Not on file    Forced sexual activity: Not on file  Other Topics Concern   Not on file  Social History Narrative   Not on file    Family History: Family History  Problem Relation Age of Onset   Breast cancer Mother 45       triple negative   Leukemia Father    Lung cancer Father    Heart attack Maternal Uncle    Prostate cancer Paternal Uncle    COPD Paternal Grandmother    Heart disease Paternal Grandfather    Prostate cancer Paternal Uncle    Leukemia Cousin       ALLERGIES:  is allergic to sumatriptan; adhesive [tape]; and statins.  Meds: Current Outpatient Medications  Medication Sig Dispense Refill   FLUoxetine (PROZAC) 40 MG capsule Take 40 mg by mouth every morning.      lisdexamfetamine (VYVANSE) 30 MG capsule Take 30 mg by mouth daily.      LORazepam (ATIVAN) 1 MG tablet Take 1 tablet (1 mg total) by mouth as needed. (Patient taking differently: Take 1 mg by mouth 2 (two) times daily as needed for anxiety. ) 30 tablet 2   rizatriptan (MAXALT) 10 MG tablet Take 10 mg by mouth daily as needed for  migraine. May repeat in 2 hours if needed      spironolactone (ALDACTONE) 100 MG tablet Take 100 mg by mouth daily.     No current facility-administered medications for this encounter.     Physical Findings:  vitals were not taken for this visit.  Pain Assessment Pain Score: 7  Pain Loc: Elbow/10 In general this is a well appearing Caucasian  female in no acute distress.  She's alert and oriented x4 and appropriate throughout the examination. Cardiopulmonary assessment is negative for acute distress and she exhibits normal effort.    Lab Findings: Lab Results  Component Value Date   WBC 6.2 11/21/2017   HGB 11.9 (L) 11/21/2017   HCT 37.4 11/21/2017   MCV 90.6 11/21/2017   PLT 214 11/21/2017     Radiographic Findings: Mr Jeri Cos LF Contrast  Result Date: 08/02/2018 CLINICAL DATA:  43 year old female with stage IV breast cancer. Restaging. EXAM: MRI HEAD WITHOUT AND WITH CONTRAST TECHNIQUE: Multiplanar, multiecho pulse sequences of the brain and surrounding structures were obtained without and with intravenous contrast. CONTRAST:  7 milliliters Gadavist COMPARISON:  Brain MRI 02/02/2018 and earlier. FINDINGS: Brain: No abnormal enhancement identified. No midline shift, mass effect, or evidence of intracranial mass lesion. No dural thickening. Small developmental venous anomaly in the posterior left hemisphere (series 12, image 96, normal variant). No restricted diffusion to suggest acute infarction. No ventriculomegaly, extra-axial collection or acute intracranial hemorrhage. Cervicomedullary junction and pituitary are within normal limits. Normal cerebral volume. Pearline Cables and white matter signal remains normal for age. No chronic blood products. Vascular: Major intracranial vascular flow voids are preserved. The major dural venous sinuses are enhancing and appear to be patent. Skull and upper cervical spine: Visualized bone marrow signal is within normal limits. Negative visible cervical spine  and spinal cord. Sinuses/Orbits: Negative orbits. Paranasal sinuses and mastoids are stable and well pneumatized. Other: Visible internal auditory structures appear normal. Scalp and face soft tissues appear negative. IMPRESSION: Stable and normal for age. No metastatic disease or acute intracranial abnormality. Electronically Signed   By: Genevie Ann M.D.   On: 08/02/2018 15:19   Nm Pet Image Initial (pi) Skull Base To Thigh  Result Date: 08/02/2018 CLINICAL DATA:  Initial treatment strategy for breast cancer. EXAM: NUCLEAR MEDICINE PET SKULL BASE TO THIGH TECHNIQUE: 7.27 mCi F-18 FDG was injected intravenously. Full-ring PET imaging was performed from the skull base to thigh after the radiotracer. CT data was obtained and used for attenuation correction and anatomic localization. Fasting blood glucose: 93 mg/dl COMPARISON:  None FINDINGS: Mediastinal blood pool activity: SUV max 2.06 Liver activity: SUV max NA NECK: No hypermetabolic lymph nodes in the neck. Incidental CT findings: none CHEST: No hypermetabolic supraclavicular or axillary lymph nodes. No hypermetabolic mediastinal or hilar lymph nodes. No pleural effusion. Bilateral hypermetabolic pulmonary nodules: -left upper lobe lung nodule measures 1.3 cm and has a SUV max of 3.88. -Peripheral lingular nodule measures 1.4 cm and has SUV max of 3.7. -Central lingular nodule measures 1.2 cm within SUV max of 9.76. -Right middle lobe lung nodule measures 1.5 cm within SUV max of 9.90. Incidental CT findings: Bilateral breast reconstruction. ABDOMEN/PELVIS: No abnormal radiotracer activity within the liver, pancreas, or spleen. No abnormal uptake within the adrenal glands. No hypermetabolic abdominopelvic lymph nodes. Incidental CT findings: none SKELETON: Multifocal hypermetabolic bone metastases are identified: -Index lytic lesion within the right inferior pubic rami measures 1.3 cm and has an SUV max of 3.81. -Tiny lytic lesion within the sacrum measures 0.8 cm  and has an SUV max of 4.05. -Within the T12 vertebra there is a small central lytic lesion measuring 6 mm with SUV max of 4.3. -Within the posteromedial aspect of the right eleventh rib there is a small lesion measuring approximately 6 mm with SUV max of 4.47. Incidental CT findings: none IMPRESSION: 1. Bilateral hypermetabolic pulmonary nodules and multifocal hypermetabolic lytic  bone lesions compatible with metastatic disease. Electronically Signed   By: Kerby Moors M.D.   On: 08/02/2018 14:43    Impression/Plan: 1.  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  The patient is counseled on the findings and concerns for this is a recurrent metastatic picture.  She is planning to undergo biopsy of the lung for tissue confirmation but also for biologic markers to determine her next course of systemic therapy.  We will follow-up with these results as well.  Dr. [name] has reviewed her case and would like to offer a palliative course to the right humerus along the medial condyle.  We discussed the risks, benefits, short and long-term effects of radiotherapy along with the rationale for administering this in a palliative fashion.  She will come today to do simulation at 3:00.  We discussed the course of 10 fractions the Dr. [name] recommends.  At the end of the conversation she is ready to proceed. 2. Contraception.  The patient's husband has had a vasectomy and she ensures [place] there is no chance that she could be pregnant.  She declines urine hCG testing.  We will proceed with simulation. 3. Amenorrhea.  I have encouraged the patient to contact her OB/GYN and to speak with Dr. Lindi Adie regarding this.  It is possible that some of her prior chemotherapy has had an impact on her ovarian function and she can have premature ovarian failure as a result of her prior treatment.  This encounter was provided by telemedicine platform Webex.    The patient has given verbal consent for this type of encounter and has been advised to only accept a meeting of this type in a secure network environment. The time spent during this encounter was 25 minutes. The attendants for this meeting include Mardene Sayer, LPN,  Hayden Pedro  and [place].  During the encounter,  Mardene Sayer, LPN and Hayden Pedro were located at [facility] Radiation Oncology Department.  Cloe Sockwell Poteat was located at home.      Carola Rhine, PAC

## 2018-08-08 ENCOUNTER — Ambulatory Visit (HOSPITAL_COMMUNITY)
Admission: RE | Admit: 2018-08-08 | Discharge: 2018-08-08 | Disposition: A | Discharge status: Home / Self Care | Payer: 59 | Source: Ambulatory Visit | Attending: Hematology and Oncology | Admitting: Hematology and Oncology

## 2018-08-08 ENCOUNTER — Inpatient Hospital Stay (HOSPITAL_COMMUNITY): Admission: RE | Admit: 2018-08-08 | Payer: 59 | Source: Ambulatory Visit

## 2018-08-08 ENCOUNTER — Encounter (HOSPITAL_COMMUNITY): Payer: Self-pay

## 2018-08-08 ENCOUNTER — Ambulatory Visit (HOSPITAL_COMMUNITY)
Admission: RE | Admit: 2018-08-08 | Discharge: 2018-08-08 | Disposition: A | Discharge status: Home / Self Care | Payer: 59 | Source: Ambulatory Visit | Attending: Interventional Radiology | Admitting: Interventional Radiology

## 2018-08-08 ENCOUNTER — Ambulatory Visit
Admission: RE | Admit: 2018-08-08 | Discharge: 2018-08-08 | Disposition: A | Discharge status: Home / Self Care | Payer: 59 | Source: Ambulatory Visit | Attending: Radiation Oncology | Admitting: Radiation Oncology

## 2018-08-08 ENCOUNTER — Other Ambulatory Visit: Payer: Self-pay

## 2018-08-08 DIAGNOSIS — Z888 Allergy status to other drugs, medicaments and biological substances status: Secondary | ICD-10-CM | POA: Diagnosis not present

## 2018-08-08 DIAGNOSIS — Z79899 Other long term (current) drug therapy: Secondary | ICD-10-CM | POA: Insufficient documentation

## 2018-08-08 DIAGNOSIS — C50919 Malignant neoplasm of unspecified site of unspecified female breast: Secondary | ICD-10-CM

## 2018-08-08 DIAGNOSIS — M899 Disorder of bone, unspecified: Secondary | ICD-10-CM | POA: Insufficient documentation

## 2018-08-08 DIAGNOSIS — C7802 Secondary malignant neoplasm of left lung: Secondary | ICD-10-CM | POA: Insufficient documentation

## 2018-08-08 DIAGNOSIS — Z801 Family history of malignant neoplasm of trachea, bronchus and lung: Secondary | ICD-10-CM | POA: Insufficient documentation

## 2018-08-08 DIAGNOSIS — Z853 Personal history of malignant neoplasm of breast: Secondary | ICD-10-CM | POA: Insufficient documentation

## 2018-08-08 DIAGNOSIS — Z86718 Personal history of other venous thrombosis and embolism: Secondary | ICD-10-CM | POA: Insufficient documentation

## 2018-08-08 DIAGNOSIS — R918 Other nonspecific abnormal finding of lung field: Secondary | ICD-10-CM | POA: Diagnosis present

## 2018-08-08 DIAGNOSIS — J95811 Postprocedural pneumothorax: Secondary | ICD-10-CM

## 2018-08-08 DIAGNOSIS — C7951 Secondary malignant neoplasm of bone: Secondary | ICD-10-CM | POA: Diagnosis not present

## 2018-08-08 LAB — CBC
HCT: 39.5 % (ref 36.0–46.0)
Hemoglobin: 13.3 g/dL (ref 12.0–15.0)
MCH: 32.1 pg (ref 26.0–34.0)
MCHC: 33.7 g/dL (ref 30.0–36.0)
MCV: 95.4 fL (ref 80.0–100.0)
Platelets: 309 10*3/uL (ref 150–400)
RBC: 4.14 MIL/uL (ref 3.87–5.11)
RDW: 13 % (ref 11.5–15.5)
WBC: 12.1 10*3/uL — ABNORMAL HIGH (ref 4.0–10.5)
nRBC: 0 % (ref 0.0–0.2)

## 2018-08-08 LAB — PREGNANCY, URINE: Preg Test, Ur: NEGATIVE

## 2018-08-08 LAB — PROTIME-INR
INR: 1.1 (ref 0.8–1.2)
Prothrombin Time: 13.7 seconds (ref 11.4–15.2)

## 2018-08-08 MED ORDER — SODIUM CHLORIDE 0.9 % IV SOLN: INTRAVENOUS | Status: DC

## 2018-08-08 MED ORDER — FENTANYL CITRATE (PF) 100 MCG/2ML IJ SOLN
INTRAMUSCULAR | Status: AC | PRN
  Administered 2018-08-08 (×2): 25 ug via INTRAVENOUS

## 2018-08-08 MED ORDER — LIDOCAINE HCL 1 % IJ SOLN
INTRAMUSCULAR | Status: AC
  Filled 2018-08-08: qty 20

## 2018-08-08 MED ORDER — MIDAZOLAM HCL 2 MG/2ML IJ SOLN
INTRAMUSCULAR | Status: AC | PRN
  Administered 2018-08-08: 1 mg via INTRAVENOUS

## 2018-08-08 MED ORDER — FENTANYL CITRATE (PF) 100 MCG/2ML IJ SOLN
INTRAMUSCULAR | Status: AC
  Filled 2018-08-08: qty 2

## 2018-08-08 MED ORDER — MIDAZOLAM HCL 2 MG/2ML IJ SOLN
INTRAMUSCULAR | Status: AC
  Filled 2018-08-08: qty 2

## 2018-08-08 NOTE — Procedures (Signed)
Interventional Radiology Procedure Note  Procedure: CT guided biopsy of left upper lobe nodule Complications: None Recommendations: - Bedrest until CXR cleared.  Minimize talking, coughing or otherwise straining.  - Follow up 1 hr CXR pending  - NPO until CXR cleared  Signed,  Corrie Mckusick, DO

## 2018-08-08 NOTE — H&P (Signed)
Chief Complaint: Patient was seen in consultation today for Pulmonary nodule biopsy at the request of [place]  Referring Physician(s): [place]  Supervising Physician: Corrie Mckusick  Patient Status: [facility] - Out-pt  History of Present Illness: Laura Anthony is a 43 y.o. female   Hx Breast Cancer 2018 [place] from [place] 05/2018-- developed right elbow pain Imaging revealed: lytic lesion of medial epicondyle right arm  PET 08/02/18: IMPRESSION: 1. Bilateral hypermetabolic pulmonary nodules and multifocal hypermetabolic lytic bone lesions compatible with metastatic Disease. 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   right   Breast cancer, left breast ([place])    S/P mastectomy 03/27/2017   DVT (deep venous thrombosis) ([place]) 2017   calf left - probably due to [facility] pills-took eliquis x3 mos, nonthing now   Family history of breast cancer    Family history of prostate cancer    High cholesterol    Impingement syndrome of right shoulder 07/2013   Migraine    "usually 1/month" (03/28/2017)   PONV (postoperative nausea and vomiting)    Right bicipital tenosynovitis 07/2013   Rotator cuff impingement syndrome of right shoulder 07/12/2013   Seizures ([place])    x 1 as a child - was never on anticonvulsants (03/28/2017)    Past Surgical History:  Procedure Laterality Date   ADENOIDECTOMY  1981   ANKLE ARTHROSCOPY Right    BREAST BIOPSY Left 10/2016   KNEE ARTHROSCOPY Right    KNEE ARTHROSCOPY W/ ACL RECONSTRUCTION Left    LIPOSUCTION WITH LIPOFILLING Bilateral 01/05/2018   Procedure: LIPOFILLING FROM ABDOMEN TO BILATERAL CHEST;  Surgeon: Irene Limbo, MD;  Location: [place];   Service: Plastics;  Laterality: Bilateral;   MASTECTOMY Left 03/27/2017   NIPPLE SPARING MASTECTOMY WITH RADIOACTIVE SEED TARGETED LYMPH NODE EXCISION AND LEFT AXILLARY SENTINEL LYMPH NODE BIOPSY   MASTECTOMY Right 03/27/2017   RIGHT PROPHYLACTIC NIPPLE SPARING MASTECTOMY   NIPPLE SPARING MASTECTOMY Right 03/27/2017   Procedure: RIGHT PROPHYLACTIC NIPPLE SPARING MASTECTOMY;  Surgeon: Rolm Bookbinder, MD;  Location: [place];  Service: General;  Laterality: Right;   PORT-A-CATH REMOVAL Right 01/05/2018   Procedure: REMOVAL RIGHT CHEST PORT;  Surgeon: Irene Limbo, MD;  Location: [place];  Service: Plastics;  Laterality: Right;   PORTACATH PLACEMENT N/A 11/01/2016   Procedure: INSERTION PORT-A-CATH WITH [place];  Surgeon: Rolm Bookbinder, MD;  Location: [place];  Service: General;  Laterality: N/A;   RADIOACTIVE SEED GUIDED AXILLARY SENTINEL LYMPH NODE Left 03/27/2017   Procedure: LEFT NIPPLE SPARING MASTECTOMY WITH RADIOACTIVE SEED TARGETED LYMPH NODE EXCISION AND LEFT AXILLARY SENTINEL LYMPH NODE BIOPSY;  Surgeon: Rolm Bookbinder, MD;  Location: [place];  Service: General;  Laterality: Left;  REQUESTS RNFA   RECONSTRUCTION BREAST IMMEDIATE / DELAYED W/ TISSUE EXPANDER Bilateral 03/27/2017   BILATERAL BREAST RECONSTRUCTION WITH PLACEMENT OF TISSUE EXPANDER AND ALLODERM   REMOVAL OF BILATERAL TISSUE EXPANDERS WITH PLACEMENT OF BILATERAL BREAST IMPLANTS Bilateral 01/05/2018   Procedure: REMOVAL OF BILATERAL TISSUE EXPANDERS WITH PLACEMENT OF BILATERAL BREAST IMPLANTS;  Surgeon: Irene Limbo, MD;  Location: [place];  Service: Plastics;  Laterality: Bilateral;   SEPTOPLASTY WITH ETHMOIDECTOMY, AND MAXILLARY ANTROSTOMY  10/29/2010   bilat. max. antrostomy with left  max. stripping; left ant. ethmoidectomy; right total ethmoidectomy; sphenoidotomy   SHOULDER ARTHROSCOPY WITH SUBACROMIAL DECOMPRESSION AND BICEP TENDON  REPAIR Right 07/12/2013   Procedure: RIGHT SHOULDER ARTHROSCOPY DEBRIDEMENT EXTENSIVE DECOMPRESSION SUBACROMIAL PARTIAL ACROMIOPLASTY;  Surgeon: Johnny Bridge, MD;  Location: [place];  Service: Orthopedics;  Laterality: Right;   WRIST ARTHROSCOPY  01/17/2012   Procedure: ARTHROSCOPY WRIST; right wrist Surgeon: Tennis Must, MD;  Location: [place];  Service: Orthopedics;  Laterality: Right;  RIGHT WRIST ARTHROSCOPY WITH TRIANGULAR FIBROCARTILAGE COMPLEX REPAIR AND DEBRIDEMENT     Allergies: Sumatriptan, Adhesive [tape], and Statins  Medications: Prior to Admission medications   Medication Sig Start Date End Date Taking? Authorizing Provider  FLUoxetine (PROZAC) 40 MG capsule Take 40 mg by mouth every morning.    Yes [provider]  lisdexamfetamine (VYVANSE) 30 MG capsule Take 30 mg by mouth daily.    Yes [provider]  LORazepam (ATIVAN) 1 MG tablet Take 1 tablet (1 mg total) by mouth as needed. Patient taking differently: Take 1 mg by mouth 2 (two) times daily as needed for anxiety.  02/06/18  Yes Nicholas Lose, MD  predniSONE (DELTASONE) 10 MG tablet Take 6 tabs po daily x 5 days, 5 tabs po x 2 days, 4 tabs po x 2 days, 3 tabs po x 2 days, 2 tabs po x 2 days, 1 tab po x 2 days, then dc 08/07/18  Yes Hayden Pedro, PA-C  spironolactone (ALDACTONE) 100 MG tablet Take 100 mg by mouth daily.   Yes [provider]  rizatriptan (MAXALT) 10 MG tablet Take 10 mg by mouth daily as needed for migraine. May repeat in 2 hours if needed     [provider]     Family History  Problem Relation Age of Onset   Breast cancer Mother 7       triple negative   Leukemia Father    Lung cancer Father    Heart attack Maternal Uncle    Prostate cancer Paternal Uncle    COPD Paternal Grandmother    Heart disease Paternal Grandfather    Prostate cancer Paternal Uncle    Leukemia Cousin     Social History    Socioeconomic History   Marital status: Married    Spouse name: Not on file   Number of children: Not on file   Years of education: Not on file   Highest education level: Not on file  Occupational History   Not on file  Social Needs   Financial resource strain: Not on file   Food insecurity    Worry: Not on file    Inability: Not on file   Transportation needs    Medical: Not on file    Non-medical: Not on file  Tobacco Use   Smoking status: Never Smoker   Smokeless tobacco: Never Used  Substance and Sexual Activity   Alcohol use: Yes    Comment: Drinks very rare   Drug use: No   Sexual activity: Yes    Birth control/protection: Surgical    Comment: husband has had a vasectomy  Lifestyle   Physical activity    Days per week: Not on file    Minutes per session: Not on file   Stress: Not on file  Relationships   Social connections    Talks on phone: Not on file    Gets together: Not on file    Attends religious service: Not on file  Active member of club or organization: Not on file    Attends meetings of clubs or organizations: Not on file    Relationship status: Not on file  Other Topics Concern   Not on file  Social History Narrative   Not on file    Review of Systems: A 12 point ROS discussed and pertinent positives are indicated in the HPI above.  All other systems are negative.  Review of Systems  Constitutional: Negative for activity change, fatigue and fever.  Respiratory: Negative for cough and shortness of breath.   Cardiovascular: Negative for chest pain.  Gastrointestinal: Negative for abdominal pain.  Musculoskeletal: Negative for gait problem.  Neurological: Negative for weakness.  Psychiatric/Behavioral: Negative for behavioral problems and confusion.    Vital Signs: BP 121/86    Pulse 69    Temp 97.7 F (36.5 C) (Oral)    Ht 5\' 7"  (1.702 m)    Wt 150 lb (68 kg)    SpO2 98%    BMI 23.49 kg/m   Physical Exam Vitals  signs reviewed.  Cardiovascular:     Rate and Rhythm: Normal rate and regular rhythm.     Heart sounds: Normal heart sounds.  Pulmonary:     Breath sounds: Normal breath sounds.  Abdominal:     Tenderness: There is no abdominal tenderness.  Musculoskeletal: Normal range of motion.     Comments: Right arm pain  Skin:    General: Skin is warm and dry.  Neurological:     Mental Status: She is alert and oriented to person, place, and time.  Psychiatric:        Mood and Affect: Mood normal.        Behavior: Behavior normal.        Thought Content: Thought content normal.        Judgment: Judgment normal.     Imaging: Mr Jeri Cos Wo Contrast  Result Date: 08/02/2018 CLINICAL DATA:  43 year old female with stage IV breast cancer. Restaging. EXAM: MRI HEAD WITHOUT AND WITH CONTRAST TECHNIQUE: Multiplanar, multiecho pulse sequences of the brain and surrounding structures were obtained without and with intravenous contrast. CONTRAST:  7 milliliters Gadavist COMPARISON:  Brain MRI 02/02/2018 and earlier. FINDINGS: Brain: No abnormal enhancement identified. No midline shift, mass effect, or evidence of intracranial mass lesion. No dural thickening. Small developmental venous anomaly in the posterior left hemisphere (series 12, image 96, normal variant). No restricted diffusion to suggest acute infarction. No ventriculomegaly, extra-axial collection or acute intracranial hemorrhage. Cervicomedullary junction and pituitary are within normal limits. Normal cerebral volume. Pearline Cables and white matter signal remains normal for age. No chronic blood products. Vascular: Major intracranial vascular flow voids are preserved. The major dural venous sinuses are enhancing and appear to be patent. Skull and upper cervical spine: Visualized bone marrow signal is within normal limits. Negative visible cervical spine and spinal cord. Sinuses/Orbits: Negative orbits. Paranasal sinuses and mastoids are stable and well  pneumatized. Other: Visible internal auditory structures appear normal. Scalp and face soft tissues appear negative. IMPRESSION: Stable and normal for age. No metastatic disease or acute intracranial abnormality. Electronically Signed   By: Genevie Ann M.D.   On: 08/02/2018 15:19   Nm Pet Image Initial (pi) Skull Base To Thigh  Result Date: 08/02/2018 CLINICAL DATA:  Initial treatment strategy for breast cancer. EXAM: NUCLEAR MEDICINE PET SKULL BASE TO THIGH TECHNIQUE: 7.27 mCi F-18 FDG was injected intravenously. Full-ring PET imaging was performed from the skull base to thigh after  the radiotracer. CT data was obtained and used for attenuation correction and anatomic localization. Fasting blood glucose: 93 mg/dl COMPARISON:  None FINDINGS: Mediastinal blood pool activity: SUV max 2.06 Liver activity: SUV max NA NECK: No hypermetabolic lymph nodes in the neck. Incidental CT findings: none CHEST: No hypermetabolic supraclavicular or axillary lymph nodes. No hypermetabolic mediastinal or hilar lymph nodes. No pleural effusion. Bilateral hypermetabolic pulmonary nodules: -left upper lobe lung nodule measures 1.3 cm and has a SUV max of 3.88. -Peripheral lingular nodule measures 1.4 cm and has SUV max of 3.7. -Central lingular nodule measures 1.2 cm within SUV max of 9.76. -Right middle lobe lung nodule measures 1.5 cm within SUV max of 9.90. Incidental CT findings: Bilateral breast reconstruction. ABDOMEN/PELVIS: No abnormal radiotracer activity within the liver, pancreas, or spleen. No abnormal uptake within the adrenal glands. No hypermetabolic abdominopelvic lymph nodes. Incidental CT findings: none SKELETON: Multifocal hypermetabolic bone metastases are identified: -Index lytic lesion within the right inferior pubic rami measures 1.3 cm and has an SUV max of 3.81. -Tiny lytic lesion within the sacrum measures 0.8 cm and has an SUV max of 4.05. -Within the T12 vertebra there is a small central lytic lesion measuring  6 mm with SUV max of 4.3. -Within the posteromedial aspect of the right eleventh rib there is a small lesion measuring approximately 6 mm with SUV max of 4.47. Incidental CT findings: none IMPRESSION: 1. Bilateral hypermetabolic pulmonary nodules and multifocal hypermetabolic lytic bone lesions compatible with metastatic disease. Electronically Signed   By: Kerby Moors M.D.   On: 08/02/2018 14:43    Labs:  CBC: Recent Labs    09/01/17 0910 10/12/17 1057 11/10/17 1135 11/21/17 0932  WBC 3.6* 5.1 5.2 6.2  HGB 10.9* 11.3* 12.1 11.9*  HCT 32.9* 34.5* 37.3 37.4  PLT 214 198 197 214    COAGS: No results for input(s): INR, APTT in the last 8760 hours.  BMP: Recent Labs    09/01/17 0910 10/12/17 1057 11/10/17 1136 11/21/17 1020  NA 141 139 138 139  K 3.7 4.1 4.0 4.3  CL 106 106 107 106  CO2 27 26 24 25   GLUCOSE 78 96 97 80  BUN 15 18 13 12   CALCIUM 8.7* 9.0 8.9 9.2  CREATININE 0.82 0.87 0.81 0.83  GFRNONAA >60 >60 >60 >60  GFRAA >60 >60 >60 >60    LIVER FUNCTION TESTS: Recent Labs    09/01/17 0910 10/12/17 1057 11/10/17 1136 11/21/17 1020  BILITOT 0.3 0.4 0.2* 0.3  AST 18 16 18 17   ALT 12 11 12 14   ALKPHOS 97 103 105 99  PROT 7.0 7.2 7.2 7.4  ALBUMIN 3.4* 3.5 3.5 3.6    TUMOR MARKERS: No results for input(s): AFPTM, CEA, CA199, CHROMGRNA in the last 8760 hours.  Assessment and Plan:  Hx breast cancer 2018 Right arm injury work up revealed Rt medial epicondyle lytic lesion +PET showing bony lesions and Pulmonary nodules Now scheduled for pulmonary nodule biopsy Risks and benefits of CT guided lung nodule biopsy was discussed with the patient including, but not limited to bleeding, hemoptysis, respiratory failure requiring intubation, infection, pneumothorax requiring chest tube placement, stroke from air embolism or even death.  All of the patient's questions were answered and the patient is agreeable to proceed. Consent signed and in chart.   Thank you  for this interesting consult.  I greatly enjoyed meeting Laura Anthony and look forward to participating in their care.  A copy of this report  was sent to the requesting provider on this date.  Electronically Signed: Lavonia Drafts, PA-C 08/08/2018, 10:10 AM   I spent a total of  30 Minutes   in face to face in clinical consultation, greater than 50% of which was counseling/coordinating care for pulmonary nodule biopsy

## 2018-08-08 NOTE — Discharge Instructions (Addendum)
Needle Biopsy, Care After °This sheet gives you information about how to care for yourself after your procedure. Your health care provider may also give you more specific instructions. If you have problems or questions, contact your health care provider. °What can I expect after the procedure? °After the procedure, it is common to have soreness, bruising, or mild pain at the puncture site. This should go away in a few days. °Follow these instructions at home: °Needle insertion site care ° °· Wash your hands with soap and water before you change your bandage (dressing). If you cannot use soap and water, use hand sanitizer. °· Follow instructions from your health care provider about how to take care of your puncture site. This includes: °? When and how to change your dressing. °? When to remove your dressing. °· Check your puncture site every day for signs of infection. Check for: °? Redness, swelling, or pain. °? Fluid or blood. °? Pus or a bad smell. °? Warmth. °General instructions °· Return to your normal activities as told by your health care provider. Ask your health care provider what activities are safe for you. °· Do not take baths, swim, or use a hot tub until your health care provider approves. Ask your health care provider if you may take showers. You may only be allowed to take sponge baths. °· Take over-the-counter and prescription medicines only as told by your health care provider. °· Keep all follow-up visits as told by your health care provider. This is important. °Contact a health care provider if: °· You have a fever. °· You have redness, swelling, or pain at the puncture site that lasts longer than a few days. °· You have fluid, blood, or pus coming from your puncture site. °· Your puncture site feels warm to the touch. °Get help right away if: °· You have severe bleeding from the puncture site. °Summary °· After the procedure, it is common to have soreness, bruising, or mild pain at the puncture  site. This should go away in a few days. °· Check your puncture site every day for signs of infection, such as redness, swelling, or pain. °· Get help right away if you have severe bleeding from your puncture site. °This information is not intended to replace advice given to you by your health care provider. Make sure you discuss any questions you have with your health care provider. °Document Released: 06/03/2014 Document Revised: 03/31/2017 Document Reviewed: 01/30/2017 °Elsevier Patient Education © 2020 [employer] °Moderate Conscious Sedation, Adult, Care After °These instructions provide you with information about caring for yourself after your procedure. Your health care provider may also give you more specific instructions. Your treatment has been planned according to current medical practices, but problems sometimes occur. Call your health care provider if you have any problems or questions after your procedure. °What can I expect after the procedure? °After your procedure, it is common: °· To feel sleepy for several hours. °· To feel clumsy and have poor balance for several hours. °· To have poor judgment for several hours. °· To vomit if you eat too soon. °Follow these instructions at home: °For at least 24 hours after the procedure: ° °· Do not: °? Participate in activities where you could fall or become injured. °? Drive. °? Use heavy machinery. °? Drink alcohol. °? Take sleeping pills or medicines that cause drowsiness. °? Make important decisions or sign legal documents. °? Take care of children on your own. °· Rest. °Eating and   drinking °· Follow the diet recommended by your health care provider. °· If you vomit: °? Drink water, juice, or soup when you can drink without vomiting. °? Make sure you have little or no nausea before eating solid foods. °General instructions °· Have a responsible adult stay with you until you are awake and alert. °· Take over-the-counter and prescription medicines only as  told by your health care provider. °· If you smoke, do not smoke without supervision. °· Keep all follow-up visits as told by your health care provider. This is important. °Contact a health care provider if: °· You keep feeling nauseous or you keep vomiting. °· You feel light-headed. °· You develop a rash. °· You have a fever. °Get help right away if: °· You have trouble breathing. °This information is not intended to replace advice given to you by your health care provider. Make sure you discuss any questions you have with your health care provider. °Document Released: 11/07/2012 Document Revised: 12/30/2016 Document Reviewed: 05/09/2015 °Elsevier Patient Education © 2020 [employer] ° °

## 2018-08-09 ENCOUNTER — Ambulatory Visit
Admission: RE | Admit: 2018-08-09 | Discharge: 2018-08-09 | Disposition: A | Discharge status: Home / Self Care | Payer: 59 | Source: Ambulatory Visit | Attending: Radiation Oncology | Admitting: Radiation Oncology

## 2018-08-09 ENCOUNTER — Other Ambulatory Visit: Payer: Self-pay

## 2018-08-09 DIAGNOSIS — C7951 Secondary malignant neoplasm of bone: Secondary | ICD-10-CM | POA: Diagnosis not present

## 2018-08-10 ENCOUNTER — Ambulatory Visit
Admission: RE | Admit: 2018-08-10 | Discharge: 2018-08-10 | Disposition: A | Discharge status: Home / Self Care | Payer: 59 | Source: Ambulatory Visit | Attending: Radiation Oncology | Admitting: Radiation Oncology

## 2018-08-10 ENCOUNTER — Other Ambulatory Visit: Payer: Self-pay

## 2018-08-10 DIAGNOSIS — C7951 Secondary malignant neoplasm of bone: Secondary | ICD-10-CM | POA: Diagnosis not present

## 2018-08-13 ENCOUNTER — Ambulatory Visit
Admission: RE | Admit: 2018-08-13 | Discharge: 2018-08-13 | Disposition: A | Discharge status: Home / Self Care | Payer: 59 | Source: Ambulatory Visit | Attending: Radiation Oncology | Admitting: Radiation Oncology

## 2018-08-13 ENCOUNTER — Other Ambulatory Visit: Payer: Self-pay

## 2018-08-13 DIAGNOSIS — C7951 Secondary malignant neoplasm of bone: Secondary | ICD-10-CM | POA: Diagnosis not present

## 2018-08-14 ENCOUNTER — Other Ambulatory Visit: Payer: Self-pay

## 2018-08-14 ENCOUNTER — Encounter: Payer: Self-pay | Admitting: *Deleted

## 2018-08-14 ENCOUNTER — Ambulatory Visit
Admission: RE | Admit: 2018-08-14 | Discharge: 2018-08-14 | Disposition: A | Discharge status: Home / Self Care | Payer: 59 | Source: Ambulatory Visit | Attending: Radiation Oncology | Admitting: Radiation Oncology

## 2018-08-14 ENCOUNTER — Other Ambulatory Visit: Payer: Self-pay | Admitting: General Surgery

## 2018-08-14 DIAGNOSIS — C7951 Secondary malignant neoplasm of bone: Secondary | ICD-10-CM | POA: Diagnosis not present

## 2018-08-14 NOTE — Progress Notes (Signed)
Patient Care Team: [name], MD as PCP - General (Family Medicine) Mauro Kaufmann, RN as Oncology Nurse Navigator Rockwell Germany, RN as Oncology Nurse Navigator  DIAGNOSIS:    ICD-10-CM   1. Bone metastases (HCC)  C79.51   2. Malignant neoplasm of lower-outer quadrant of left breast of female, estrogen receptor negative ([place])  C50.512    Z17.1     SUMMARY OF ONCOLOGIC HISTORY: 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, estrogen receptor negative ([place])  10/20/2016 Mammogram   Mammogram and ultrasound of the left breast revealed 1.7 cm mass at 4:00 position, 6:30 position 5 x 4 x 4 mm mass, 6:00 position 5 cm nipple 7 x 6 x 11 mm, left axillary lymph node with thickened cortex, T1c N1 stage II a AJCC 8   10/24/2016 Initial Diagnosis   Left breast biopsy 6:30 position 3 cm from nipple: IDC grade 2, DCIS, ER 0%, PR 0%, Ki-67 15% HER-2 positive ratio 2.1; 4:00 position 3 cm from nipple: IDC grade 2, DCIS, ER 0%, PR 0%, Ki-67 35%, HER-2 positive ratio 2.02; left axillary lymph node biopsy positive   11/04/2016 - 02/17/2017 Neo-Adjuvant Chemotherapy   TCH Perjeta 6 cycles followed by Herceptin + Perjeta maintenance to be completed September 2019   11/30/2016 Genetic Testing   Negative genetic testing on the common hereditary cancer panel.  The Hereditary Gene Panel offered by [facility] includes sequencing and/or deletion duplication testing of the following 47 genes: APC, ATM, AXIN2, BARD1, BMPR1A, BRCA1, BRCA2, BRIP1, CDH1, CDK4, CDKN2A (p14ARF), CDKN2A (p16INK4a), CHEK2, CTNNA1, DICER1, EPCAM (Deletion/duplication testing only), GREM1 (promoter region deletion/duplication testing only), KIT, MEN1, MLH1, MSH2, MSH3, MSH6, MUTYH, NBN, NF1, NHTL1, PALB2, PDGFRA, PMS2, POLD1, POLE, PTEN, RAD50, RAD51C, RAD51D, SDHB, SDHC, SDHD, SMAD4, SMARCA4. STK11, TP53, TSC1, TSC2, and VHL.  The following genes were evaluated for sequence changes only: SDHA  and HOXB13 c.251G>A variant only. The report date is November 30, 2016.    03/27/2017 Surgery   Bilateral mastectomies: Left mastectomy: IDC grade 2 0.9 cm, nodes negative, right mastectomy benign, ER 0%, PR 0%, HER-2 positive ratio 2.6   05/08/2017 - 06/09/2017 Radiation Therapy   Adjuvant radiation therapy   10/23/2017 Miscellaneous   Neratinib discontinued after 4 weeks for severe diarrhea   07/25/2018 Relapse/Recurrence   MRI of right elbow showed bone lesion consistent with malignancy. PET scan showed bilateral pulmonary nodules and several lytic bone lesions compatible with metastatic disease. Brain MRI on 08/02/18 showed no evidence of metastatic disease.   08/02/2018 PET scan   Bilateral hypermetabolic lung nodules, LUL 1.3 cm with SUV 3.88, lingular nodule 1.4 cm SUV 3.7, central lingular nodule 1.2 cm SUV 9.76, right middle lobe nodule 1.5 cm SUV 9.9, lytic bone metastases inferior pubic ramus, sacrum, T12, right 11th rib.   08/08/2018 Procedure   Lung biopsy: metastatic carcinoma, HER-2 negative (0), ER/PR negative.   08/10/2018 -  Radiation Therapy   Palliative radiatio to the right humerus along the medial condyle     CHIEF COMPLIANT: Follow-up to discuss lung biopsy pathology and treatment options  INTERVAL HISTORY: Laura Anthony is a 43 y.o. with above-mentioned history of HER2 positive left breast cancer treated with bilateral mastectomies, who is currently on surveillance after she could not tolerate Neratinib. Elbow MRI on 07/25/18 after a fracture showed metastatic carcinoma. PET scan on 08/02/18 showed bilateral hypermetabolic pulmonary nodules and multifocal hypermetabolic lytic bone lesions compatible with metastatic disease. Brain MRI  on 08/02/18 showed no evidence of metastases. Lung biopsy on 08/08/18 showed metastatic carcinoma, HER-2 negative (0), ER/PR negative. CARIS molecular testing is pending. She began 1 of 10 treatments of palliative radiation to the right humerus  on 08/09/18. She presents to the clinic today to review her lung biopsy pathology and discuss treatment options.  Patient is currently undergoing radiation to the elbow and she received steroids because of which her pain is under excellent control. She has made appointments to have consultations at [facility], [facility], [place] [facility], [facility] etc. She is experiencing diffuse aches and pains especially in the pelvis as well as in the right rib cage and some in the midsternal area.  REVIEW OF SYSTEMS:   Constitutional: Denies fevers, chills or abnormal weight loss Eyes: Denies blurriness of vision Ears, nose, mouth, throat, and face: Denies mucositis or sore throat Respiratory: Denies cough, dyspnea or wheezes Cardiovascular: Denies palpitation, chest discomfort Gastrointestinal: Denies nausea, heartburn or change in bowel habits Skin: Denies abnormal skin rashes Lymphatics: Denies new lymphadenopathy or easy bruising Neurological: Denies numbness, tingling or new weaknesses Behavioral/Psych: Mood is stable, no new changes  Extremities: No lower extremity edema Breast: denies any pain or lumps or nodules in either breasts All other systems were reviewed with the patient and are negative.  I have reviewed the past medical history, past surgical history, social history and family history with the patient and they are unchanged from previous note.  ALLERGIES:  is allergic to sumatriptan; adhesive [tape]; and statins.  MEDICATIONS:  Current Outpatient Medications  Medication Sig Dispense Refill  . FLUoxetine (PROZAC) 40 MG capsule Take 40 mg by mouth every morning.     . lisdexamfetamine (VYVANSE) 30 MG capsule Take 30 mg by mouth daily.     Marland Kitchen LORazepam (ATIVAN) 1 MG tablet Take 1 tablet (1 mg total) by mouth as needed. (Patient taking differently: Take 1 mg by mouth 2 (two) times daily as needed for anxiety. ) 30 tablet 2  . predniSONE (DELTASONE) 10 MG tablet Take 6 tabs po daily x 5  days, 5 tabs po x 2 days, 4 tabs po x 2 days, 3 tabs po x 2 days, 2 tabs po x 2 days, 1 tab po x 2 days, then dc 60 tablet 0  . rizatriptan (MAXALT) 10 MG tablet Take 10 mg by mouth daily as needed for migraine. May repeat in 2 hours if needed     . spironolactone (ALDACTONE) 100 MG tablet Take 100 mg by mouth daily.     No current facility-administered medications for this visit.     PHYSICAL EXAMINATION: ECOG PERFORMANCE STATUS: 1 - Symptomatic but completely ambulatory  Vitals:   08/15/18 1518  BP: 132/81  Pulse: 84  Resp: 18  Temp: 99.1 F (37.3 C)  SpO2: 99%   Filed Weights   08/15/18 1518  Weight: 149 lb 1.6 oz (67.6 kg)    GENERAL: alert, no distress and comfortable SKIN: skin color, texture, turgor are normal, no rashes or significant lesions EYES: normal, Conjunctiva are pink and non-injected, sclera clear OROPHARYNX: no exudate, no erythema and lips, buccal mucosa, and tongue normal  NECK: supple, thyroid normal size, non-tender, without nodularity LYMPH: no palpable lymphadenopathy in the cervical, axillary or inguinal LUNGS: clear to auscultation and percussion with normal breathing effort HEART: regular rate & rhythm and no murmurs and no lower extremity edema ABDOMEN: abdomen soft, non-tender and normal bowel sounds MUSCULOSKELETAL: no cyanosis of digits and no clubbing  NEURO: alert &  oriented x 3 with fluent speech, no focal motor/sensory deficits EXTREMITIES: No lower extremity edema  LABORATORY DATA:  I have reviewed the data as listed CMP Latest Ref Rng & Units 11/21/2017 11/10/2017 10/12/2017  Glucose 70 - 99 mg/dL 80 97 96  BUN 6 - 20 mg/dL 12 13 18   Creatinine 0.44 - 1.00 mg/dL 0.83 0.81 0.87  Sodium 135 - 145 mmol/L 139 138 139  Potassium 3.5 - 5.1 mmol/L 4.3 4.0 4.1  Chloride 98 - 111 mmol/L 106 107 106  CO2 22 - 32 mmol/L 25 24 26   Calcium 8.9 - 10.3 mg/dL 9.2 8.9 9.0  Total Protein 6.5 - 8.1 g/dL 7.4 7.2 7.2  Total Bilirubin 0.3 - 1.2 mg/dL  0.3 0.2(L) 0.4  Alkaline Phos 38 - 126 U/L 99 105 103  AST 15 - 41 U/L 17 18 16   ALT 0 - 44 U/L 14 12 11     Lab Results  Component Value Date   WBC 12.1 (H) 08/08/2018   HGB 13.3 08/08/2018   HCT 39.5 08/08/2018   MCV 95.4 08/08/2018   PLT 309 08/08/2018   NEUTROABS 4.4 11/21/2017    ASSESSMENT & PLAN:  Malignant neoplasm of lower-outer quadrant of left breast of female, estrogen receptor negative ([place]) Mammogram and ultrasound of the left breast revealed 1.7 cm mass at 4:00 position, 6:30 position 5 x 4 x 4 mm mass, 6:00 position 5 cm nipple 7 x 6 x 11 mm, left axillary lymph node with thickened cortex, T1c N1 stage II a AJCC 8  10/24/2016: Left breast biopsy 6:30 position 3 cm from nipple: IDC grade 2, DCIS, ER 0%, PR 0%, Ki-67 15% HER-2 positive ratio 2.1; 4:00 position 3 cm from nipple: IDC grade 2, DCIS, ER 0%, PR 0%, Ki-67 35%, HER-2 positive ratio 2.02 Lymph node biopsy positive  Treatment Summary: 1. Neoadjuvant chemotherapy with TCHPcompleted 02/17/2017 this would be followed by Herceptinand Perjetamaintenance for 1 yearcompleted September 2019 2.Bilateral mastectomies 03/28/2016:Bilateral mastectomies: Left mastectomy: IDC grade 2 0.9 cm, nodes negative, right mastectomy benign, ER 0%, PR 0%, HER-2 positive ratio 2.6 3.Adjuvant radiation4/08/2017 to 06/09/2017 4.  Neratinib started 10/12/2017 discontinued due to diarrhea 5.  Elbow fracture: Due to metastatic disease, palliative radiation therapy ---------------------------------------------------------------------------------------------------------------- Elbow pain: MRI of the elbow revealed bone metastases. PET CT scan: Bilateral pulmonary nodules hypermetabolic, bone metastatic disease Lung nodule biopsy: Metastatic breast cancer triple negative  Recommendation: 1.  [facility]is molecular testing including PDL 1 2.  Systemic chemotherapy with Abraxane or Halaven or Xeloda or Sacituzumab-Govitecan.   Immunotherapy can  be added if PDL 1 is positive. 3.  Bisphosphonate therapy for bone metastases.  Patient is going to [facility] as well as [facility] and [facility] for second and third opinions. I discussed with her that if she qualifies for clinical trial then she needs to proceed with that plan.  She understands that with best care is provided through a clinical trial. I briefly provided her information on Bintrafusp alfa in HMGA2-expressing Triple Negative Breast Cancer which will open in about a month for second line therapy for triple negative breast cancer.  We will decide on the proper treatment once she gets all these opinions.  No orders of the defined types were placed in this encounter.  The patient has a good understanding of the overall plan. she agrees with it. she will call with any problems that may develop before the next visit here.  Nicholas Lose, MD 08/15/2018  Julious Oka Dorshimer am acting  as scribe for Dr. Nicholas Lose.  I have reviewed the above documentation for accuracy and completeness, and I agree with the above.

## 2018-08-14 NOTE — Progress Notes (Signed)
Faxed recent path report to attn Erin @ [phone], confirmation received, also faxed to [facility] [phone], confirmation received, patient requested 2nd opinion.

## 2018-08-14 NOTE — Progress Notes (Signed)
Per Dr. Geralyn Flash request, [facility] Molecular test filled out and faxed.

## 2018-08-15 ENCOUNTER — Telehealth: Payer: Self-pay | Admitting: *Deleted

## 2018-08-15 ENCOUNTER — Inpatient Hospital Stay: Payer: 59 | Attending: Hematology and Oncology | Admitting: Hematology and Oncology

## 2018-08-15 ENCOUNTER — Ambulatory Visit
Admission: RE | Admit: 2018-08-15 | Discharge: 2018-08-15 | Disposition: A | Discharge status: Home / Self Care | Payer: 59 | Source: Ambulatory Visit | Attending: Radiation Oncology | Admitting: Radiation Oncology

## 2018-08-15 ENCOUNTER — Other Ambulatory Visit: Payer: Self-pay

## 2018-08-15 VITALS — BP 132/81 | HR 84 | Temp 99.1°F | Resp 18 | Ht 67.0 in | Wt 149.1 lb

## 2018-08-15 DIAGNOSIS — C7951 Secondary malignant neoplasm of bone: Secondary | ICD-10-CM | POA: Diagnosis not present

## 2018-08-15 DIAGNOSIS — Z9013 Acquired absence of bilateral breasts and nipples: Secondary | ICD-10-CM | POA: Diagnosis not present

## 2018-08-15 DIAGNOSIS — Z171 Estrogen receptor negative status [ER-]: Secondary | ICD-10-CM

## 2018-08-15 DIAGNOSIS — Z79899 Other long term (current) drug therapy: Secondary | ICD-10-CM | POA: Diagnosis not present

## 2018-08-15 DIAGNOSIS — Z9221 Personal history of antineoplastic chemotherapy: Secondary | ICD-10-CM | POA: Insufficient documentation

## 2018-08-15 DIAGNOSIS — C50512 Malignant neoplasm of lower-outer quadrant of left female breast: Secondary | ICD-10-CM

## 2018-08-15 DIAGNOSIS — R918 Other nonspecific abnormal finding of lung field: Secondary | ICD-10-CM | POA: Diagnosis not present

## 2018-08-15 DIAGNOSIS — Z923 Personal history of irradiation: Secondary | ICD-10-CM | POA: Diagnosis not present

## 2018-08-15 NOTE — Assessment & Plan Note (Signed)
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 Neoadjuvant chemotherapy with TCHPcompleted 02/17/2017 this would be followed by Herceptinand Perjetamaintenance for 1 yearcompleted September 2019 2.Bilateral mastectomies 03/28/2016:Bilateral mastectomies: Left mastectomy: IDC grade 2 0.9 cm, nodes negative, right mastectomy benign, ER 0%, PR 0%, HER-2 positive ratio 2.6 3.Adjuvant radiation4/08/2017 to 06/09/2017 4.  Neratinib started 10/12/2017 discontinued due to diarrhea 5.  Elbow fracture: Due to metastatic disease, palliative radiation therapy ---------------------------------------------------------------------------------------------------------------- Elbow pain: MRI of the elbow revealed bone metastases. PET CT scan: Bilateral pulmonary nodules hypermetabolic, bone metastatic disease Lung nodule biopsy: Metastatic breast cancer triple negative  Recommendation: 1.  Caris molecular testing 2.  Systemic chemotherapy with Abraxane or Halaven or Xeloda.   Immunotherapy can be added if PDL 1 is positive. Patient is going to [place] for second and third opinions. I discussed with her that if she qualifies for clinical trial then she needs to proceed with that plan.  She understands that with best care is provided through a clinical trial.

## 2018-08-15 NOTE — Telephone Encounter (Signed)
Referral faxed to [facility] - Release [phone]

## 2018-08-15 NOTE — Telephone Encounter (Signed)
Referral faxed to [facility] - Release [phone]

## 2018-08-16 ENCOUNTER — Encounter (HOSPITAL_BASED_OUTPATIENT_CLINIC_OR_DEPARTMENT_OTHER)
Admission: RE | Admit: 2018-08-16 | Discharge: 2018-08-16 | Disposition: A | Discharge status: Home / Self Care | Payer: 59 | Source: Ambulatory Visit | Attending: General Surgery | Admitting: General Surgery

## 2018-08-16 ENCOUNTER — Other Ambulatory Visit: Payer: Self-pay

## 2018-08-16 ENCOUNTER — Ambulatory Visit
Admission: RE | Admit: 2018-08-16 | Discharge: 2018-08-16 | Disposition: A | Discharge status: Home / Self Care | Payer: 59 | Source: Ambulatory Visit | Attending: Radiation Oncology | Admitting: Radiation Oncology

## 2018-08-16 ENCOUNTER — Encounter (HOSPITAL_BASED_OUTPATIENT_CLINIC_OR_DEPARTMENT_OTHER): Payer: Self-pay | Admitting: *Deleted

## 2018-08-16 DIAGNOSIS — Z01818 Encounter for other preprocedural examination: Secondary | ICD-10-CM | POA: Insufficient documentation

## 2018-08-16 DIAGNOSIS — C7951 Secondary malignant neoplasm of bone: Secondary | ICD-10-CM | POA: Diagnosis not present

## 2018-08-16 MED ORDER — ENSURE PRE-SURGERY PO LIQD: 296.0000 mL | Freq: Once | ORAL | Status: DC

## 2018-08-17 ENCOUNTER — Other Ambulatory Visit: Payer: Self-pay

## 2018-08-17 ENCOUNTER — Telehealth: Payer: Self-pay | Admitting: *Deleted

## 2018-08-17 ENCOUNTER — Ambulatory Visit
Admission: RE | Admit: 2018-08-17 | Discharge: 2018-08-17 | Disposition: A | Discharge status: Home / Self Care | Payer: 59 | Source: Ambulatory Visit | Attending: Radiation Oncology | Admitting: Radiation Oncology

## 2018-08-17 DIAGNOSIS — C7951 Secondary malignant neoplasm of bone: Secondary | ICD-10-CM | POA: Diagnosis not present

## 2018-08-17 NOTE — Telephone Encounter (Signed)
On 08-17-18 faxed medical records to [employer]

## 2018-08-20 ENCOUNTER — Encounter (HOSPITAL_BASED_OUTPATIENT_CLINIC_OR_DEPARTMENT_OTHER)
Admission: RE | Admit: 2018-08-20 | Discharge: 2018-08-20 | Disposition: A | Discharge status: Home / Self Care | Payer: 59 | Source: Ambulatory Visit | Attending: General Surgery | Admitting: General Surgery

## 2018-08-20 ENCOUNTER — Other Ambulatory Visit (HOSPITAL_COMMUNITY)
Admission: RE | Admit: 2018-08-20 | Discharge: 2018-08-20 | Disposition: A | Discharge status: Home / Self Care | Payer: 59 | Source: Ambulatory Visit | Attending: General Surgery | Admitting: General Surgery

## 2018-08-20 ENCOUNTER — Other Ambulatory Visit: Payer: Self-pay

## 2018-08-20 ENCOUNTER — Other Ambulatory Visit: Payer: Self-pay | Admitting: Radiation Oncology

## 2018-08-20 ENCOUNTER — Ambulatory Visit
Admission: RE | Admit: 2018-08-20 | Discharge: 2018-08-20 | Disposition: A | Discharge status: Home / Self Care | Payer: 59 | Source: Ambulatory Visit | Attending: Radiation Oncology | Admitting: Radiation Oncology

## 2018-08-20 DIAGNOSIS — Z1159 Encounter for screening for other viral diseases: Secondary | ICD-10-CM | POA: Insufficient documentation

## 2018-08-20 DIAGNOSIS — Z01812 Encounter for preprocedural laboratory examination: Secondary | ICD-10-CM | POA: Insufficient documentation

## 2018-08-20 DIAGNOSIS — C50919 Malignant neoplasm of unspecified site of unspecified female breast: Secondary | ICD-10-CM | POA: Diagnosis not present

## 2018-08-20 DIAGNOSIS — C7951 Secondary malignant neoplasm of bone: Secondary | ICD-10-CM | POA: Diagnosis not present

## 2018-08-20 LAB — BASIC METABOLIC PANEL
Anion gap: 9 (ref 5–15)
BUN: 12 mg/dL (ref 6–20)
CO2: 28 mmol/L (ref 22–32)
Calcium: 8.8 mg/dL — ABNORMAL LOW (ref 8.9–10.3)
Chloride: 101 mmol/L (ref 98–111)
Creatinine, Ser: 0.97 mg/dL (ref 0.44–1.00)
GFR calc Af Amer: >60 mL/min (ref >60)
GFR calc non Af Amer: >60 mL/min (ref >60)
Glucose, Bld: 92 mg/dL (ref 70–99)
Potassium: 3.5 mmol/L (ref 3.5–5.1)
Sodium: 138 mmol/L (ref 135–145)

## 2018-08-20 LAB — SARS CORONAVIRUS 2 (TAT 6-24 HRS): SARS Coronavirus 2: NEGATIVE

## 2018-08-20 LAB — POCT PREGNANCY, URINE: Preg Test, Ur: NEGATIVE

## 2018-08-20 MED ORDER — OXYCODONE HCL 5 MG PO TABS: 5.0000 mg | ORAL_TABLET | ORAL | 0 refills | Status: DC | PRN

## 2018-08-20 NOTE — Progress Notes (Signed)
I spoke with the patient and she is having terrible right sided back pain at the site of the right posterior 11th rib. She was offered pain medication and I will reach out to Dr. Tammi Klippel since Dr. [name] is not in town this week. We will try to coordinate simulation if Dr. Tammi Klippel agrees with treatment.

## 2018-08-20 NOTE — Progress Notes (Signed)
Ensure Pre-Surgery drink given to patient with instructions to complete by 0600 DOS.  Surgical soap also given to patient with instructions for use.  Patient verbalized understanding of instructions.

## 2018-08-20 NOTE — Progress Notes (Signed)
I reviewed the patient's course and that Dr. Tammi Klippel has offered treatment. I called her back and let her know on a VM that we can proceed with simulation tomorrow following her last treatment to the elbow. I asked her to call back if that did not work for her schedule.

## 2018-08-21 ENCOUNTER — Other Ambulatory Visit: Payer: Self-pay

## 2018-08-21 ENCOUNTER — Ambulatory Visit
Admission: RE | Admit: 2018-08-21 | Discharge: 2018-08-21 | Disposition: A | Discharge status: Home / Self Care | Payer: 59 | Source: Ambulatory Visit | Attending: Radiation Oncology | Admitting: Radiation Oncology

## 2018-08-21 ENCOUNTER — Telehealth: Payer: Self-pay | Admitting: *Deleted

## 2018-08-21 DIAGNOSIS — C7951 Secondary malignant neoplasm of bone: Secondary | ICD-10-CM

## 2018-08-21 DIAGNOSIS — Z171 Estrogen receptor negative status [ER-]: Secondary | ICD-10-CM

## 2018-08-21 DIAGNOSIS — C50512 Malignant neoplasm of lower-outer quadrant of left female breast: Secondary | ICD-10-CM

## 2018-08-21 NOTE — Telephone Encounter (Signed)
Late entry 08/20/18: Pt called with c/o severe right mid back pain at site of rib met "it takes my breath away". Informed that on 7/19 it was tolerable but that is has progressively gotten worse. Msg sent to Dr. [name] and Bryson Ha PA regarding possibility of palliative xrt.

## 2018-08-21 NOTE — Progress Notes (Signed)
  Radiation Oncology         [phone] ________________________________  Name: Laura Anthony MRN: [id number]  Date: 08/21/2018  DOB: [birth year]  SIMULATION AND TREATMENT PLANNING NOTE    ICD-10-CM   1. Malignant neoplasm of lower-outer quadrant of left breast of female, estrogen receptor negative ([place])  C50.512    Z17.1   2. Bone metastases (HCC)  C79.51     DIAGNOSIS:  43 yo woman with painful proximal right 11th rib metastasis from the lower outer quadrant of the left breast cancer  NARRATIVE:  The patient was brought to the [place].  Identity was confirmed.  All relevant records and images related to the planned course of therapy were reviewed.  The patient freely provided informed written consent to proceed with treatment after reviewing the details related to the planned course of therapy. The consent form was witnessed and verified by the simulation staff.  Then, the patient was set-up in a stable reproducible  supine position for radiation therapy.  CT images were obtained.  Surface markings were placed.  The CT images were loaded into the planning software.  Then the target and avoidance structures were contoured.  Treatment planning then occurred.  The radiation prescription was entered and confirmed.  Then, I designed and supervised the construction of a total of at least 3 medically necessary complex treatment devices in the form of MLCs to shield spinal cord and lungs maximally.  I have requested : 3D Simulation  I have requested a DVH of the following structures: target, spinal cord, lungs.   PLAN:  The patient will receive 30 Gy in 10 fractions.  ________________________________  Sheral Apley Tammi Klippel, M.D.

## 2018-08-22 ENCOUNTER — Other Ambulatory Visit: Payer: Self-pay

## 2018-08-22 ENCOUNTER — Ambulatory Visit
Admission: RE | Admit: 2018-08-22 | Discharge: 2018-08-22 | Disposition: A | Discharge status: Home / Self Care | Payer: 59 | Source: Ambulatory Visit | Attending: Radiation Oncology | Admitting: Radiation Oncology

## 2018-08-22 ENCOUNTER — Other Ambulatory Visit: Payer: Self-pay | Admitting: Hematology and Oncology

## 2018-08-22 ENCOUNTER — Telehealth: Payer: Self-pay | Admitting: *Deleted

## 2018-08-22 DIAGNOSIS — C7951 Secondary malignant neoplasm of bone: Secondary | ICD-10-CM | POA: Diagnosis not present

## 2018-08-22 MED ORDER — FENTANYL 25 MCG/HR TD PT72: 1.0000 | MEDICATED_PATCH | TRANSDERMAL | 0 refills | Status: DC

## 2018-08-22 NOTE — Progress Notes (Signed)
Pain is not under good control. I instructed her to double up on the oxycodone We will add Duragesic patches 25 mcg every 72 hours starting today.  I sent a new prescription today.

## 2018-08-22 NOTE — Telephone Encounter (Signed)
Received call from pt stating that 5 mg oxy IR is not helping to control her back pain.  Per Dr. Lindi Adie, okay to take 2 tablets (10 mg total) to help control the pain.  Dr. Lindi Adie also sent in script for fentanyl patch to help control pain as well.  Pt very appreciative of call and will keep the office updated on her pain.

## 2018-08-23 ENCOUNTER — Encounter (HOSPITAL_BASED_OUTPATIENT_CLINIC_OR_DEPARTMENT_OTHER): Payer: Self-pay | Admitting: Certified Registered Nurse Anesthetist

## 2018-08-23 ENCOUNTER — Ambulatory Visit (HOSPITAL_BASED_OUTPATIENT_CLINIC_OR_DEPARTMENT_OTHER): Payer: 59 | Admitting: Certified Registered Nurse Anesthetist

## 2018-08-23 ENCOUNTER — Ambulatory Visit (HOSPITAL_BASED_OUTPATIENT_CLINIC_OR_DEPARTMENT_OTHER)
Admission: RE | Admit: 2018-08-23 | Discharge: 2018-08-23 | Disposition: A | Discharge status: Home / Self Care | Payer: 59 | Attending: General Surgery | Admitting: General Surgery

## 2018-08-23 ENCOUNTER — Other Ambulatory Visit: Payer: Self-pay | Admitting: Hematology and Oncology

## 2018-08-23 ENCOUNTER — Ambulatory Visit (HOSPITAL_COMMUNITY): Payer: 59

## 2018-08-23 ENCOUNTER — Encounter (HOSPITAL_BASED_OUTPATIENT_CLINIC_OR_DEPARTMENT_OTHER)
Admission: RE | Disposition: A | Discharge status: Home / Self Care | Payer: Self-pay | Source: Home / Self Care | Attending: General Surgery

## 2018-08-23 ENCOUNTER — Encounter: Payer: Self-pay | Admitting: Hematology and Oncology

## 2018-08-23 ENCOUNTER — Ambulatory Visit: Payer: 59

## 2018-08-23 DIAGNOSIS — Z4682 Encounter for fitting and adjustment of non-vascular catheter: Secondary | ICD-10-CM

## 2018-08-23 DIAGNOSIS — Z86718 Personal history of other venous thrombosis and embolism: Secondary | ICD-10-CM | POA: Insufficient documentation

## 2018-08-23 DIAGNOSIS — Z801 Family history of malignant neoplasm of trachea, bronchus and lung: Secondary | ICD-10-CM | POA: Insufficient documentation

## 2018-08-23 DIAGNOSIS — Z95828 Presence of other vascular implants and grafts: Secondary | ICD-10-CM

## 2018-08-23 DIAGNOSIS — Z79899 Other long term (current) drug therapy: Secondary | ICD-10-CM | POA: Diagnosis not present

## 2018-08-23 DIAGNOSIS — G43909 Migraine, unspecified, not intractable, without status migrainosus: Secondary | ICD-10-CM | POA: Insufficient documentation

## 2018-08-23 DIAGNOSIS — Z803 Family history of malignant neoplasm of breast: Secondary | ICD-10-CM | POA: Insufficient documentation

## 2018-08-23 DIAGNOSIS — C50919 Malignant neoplasm of unspecified site of unspecified female breast: Secondary | ICD-10-CM | POA: Insufficient documentation

## 2018-08-23 DIAGNOSIS — Z9013 Acquired absence of bilateral breasts and nipples: Secondary | ICD-10-CM | POA: Diagnosis not present

## 2018-08-23 DIAGNOSIS — C7951 Secondary malignant neoplasm of bone: Secondary | ICD-10-CM | POA: Insufficient documentation

## 2018-08-23 DIAGNOSIS — C7800 Secondary malignant neoplasm of unspecified lung: Secondary | ICD-10-CM | POA: Insufficient documentation

## 2018-08-23 DIAGNOSIS — F988 Other specified behavioral and emotional disorders with onset usually occurring in childhood and adolescence: Secondary | ICD-10-CM | POA: Insufficient documentation

## 2018-08-23 HISTORY — PX: PORTACATH PLACEMENT: SHX2246

## 2018-08-23 SURGERY — INSERTION, TUNNELED CENTRAL VENOUS DEVICE, WITH PORT
Anesthesia: General | Site: Chest

## 2018-08-23 MED ORDER — BUPIVACAINE HCL (PF) 0.25 % IJ SOLN
INTRAMUSCULAR | Status: DC | PRN
  Administered 2018-08-23: 10 mL

## 2018-08-23 MED ORDER — FENTANYL CITRATE (PF) 100 MCG/2ML IJ SOLN
INTRAMUSCULAR | Status: AC
  Filled 2018-08-23: qty 2

## 2018-08-23 MED ORDER — SCOPOLAMINE 1 MG/3DAYS TD PT72
1.0000 | MEDICATED_PATCH | Freq: Once | TRANSDERMAL | Status: DC
  Administered 2018-08-23: 1.5 mg via TRANSDERMAL

## 2018-08-23 MED ORDER — DEXAMETHASONE SODIUM PHOSPHATE 10 MG/ML IJ SOLN
INTRAMUSCULAR | Status: DC | PRN
  Administered 2018-08-23: 10 mg via INTRAVENOUS

## 2018-08-23 MED ORDER — CHLORHEXIDINE GLUCONATE CLOTH 2 % EX PADS: 6.0000 | MEDICATED_PAD | Freq: Once | CUTANEOUS | Status: DC

## 2018-08-23 MED ORDER — MIDAZOLAM HCL 2 MG/2ML IJ SOLN
INTRAMUSCULAR | Status: AC
  Filled 2018-08-23: qty 2

## 2018-08-23 MED ORDER — FENTANYL CITRATE (PF) 100 MCG/2ML IJ SOLN: 50.0000 ug | INTRAMUSCULAR | Status: DC | PRN

## 2018-08-23 MED ORDER — ACETAMINOPHEN 500 MG PO TABS
ORAL_TABLET | ORAL | Status: AC
  Filled 2018-08-23: qty 2

## 2018-08-23 MED ORDER — BUPIVACAINE HCL (PF) 0.25 % IJ SOLN
INTRAMUSCULAR | Status: AC
  Filled 2018-08-23: qty 30

## 2018-08-23 MED ORDER — BUPIVACAINE-EPINEPHRINE (PF) 0.5% -1:200000 IJ SOLN
INTRAMUSCULAR | Status: AC
  Filled 2018-08-23: qty 30

## 2018-08-23 MED ORDER — DIPHENHYDRAMINE HCL 50 MG/ML IJ SOLN
INTRAMUSCULAR | Status: AC
  Filled 2018-08-23: qty 1

## 2018-08-23 MED ORDER — ENSURE PRE-SURGERY PO LIQD: 296.0000 mL | Freq: Once | ORAL | Status: DC

## 2018-08-23 MED ORDER — KETOROLAC TROMETHAMINE 15 MG/ML IJ SOLN
INTRAMUSCULAR | Status: AC
  Filled 2018-08-23: qty 1

## 2018-08-23 MED ORDER — LACTATED RINGERS IV SOLN
INTRAVENOUS | Status: DC
  Administered 2018-08-23 (×2): via INTRAVENOUS

## 2018-08-23 MED ORDER — DIPHENHYDRAMINE HCL 50 MG/ML IJ SOLN
25.0000 mg | Freq: Once | INTRAMUSCULAR | Status: AC
  Administered 2018-08-23: 25 mg via INTRAVENOUS

## 2018-08-23 MED ORDER — SCOPOLAMINE 1 MG/3DAYS TD PT72: 1.0000 | MEDICATED_PATCH | TRANSDERMAL | Status: DC

## 2018-08-23 MED ORDER — HEPARIN SOD (PORK) LOCK FLUSH 100 UNIT/ML IV SOLN
INTRAVENOUS | Status: DC | PRN
  Administered 2018-08-23: 500 [IU]

## 2018-08-23 MED ORDER — MIDAZOLAM HCL 5 MG/5ML IJ SOLN
INTRAMUSCULAR | Status: DC | PRN
  Administered 2018-08-23: 2 mg via INTRAVENOUS

## 2018-08-23 MED ORDER — CEFAZOLIN SODIUM-DEXTROSE 2-4 GM/100ML-% IV SOLN
2.0000 g | INTRAVENOUS | Status: AC
  Administered 2018-08-23: 2 g via INTRAVENOUS

## 2018-08-23 MED ORDER — FENTANYL CITRATE (PF) 100 MCG/2ML IJ SOLN: 25.0000 ug | INTRAMUSCULAR | Status: DC | PRN

## 2018-08-23 MED ORDER — GABAPENTIN 100 MG PO CAPS
ORAL_CAPSULE | ORAL | Status: AC
  Filled 2018-08-23: qty 1

## 2018-08-23 MED ORDER — ONDANSETRON HCL 4 MG/2ML IJ SOLN
INTRAMUSCULAR | Status: AC
  Filled 2018-08-23: qty 2

## 2018-08-23 MED ORDER — LIDOCAINE 2% (20 MG/ML) 5 ML SYRINGE
INTRAMUSCULAR | Status: DC | PRN
  Administered 2018-08-23: 80 mg via INTRAVENOUS

## 2018-08-23 MED ORDER — FENTANYL CITRATE (PF) 100 MCG/2ML IJ SOLN
INTRAMUSCULAR | Status: DC | PRN
  Administered 2018-08-23 (×2): 50 ug via INTRAVENOUS

## 2018-08-23 MED ORDER — KETOROLAC TROMETHAMINE 15 MG/ML IJ SOLN
15.0000 mg | INTRAMUSCULAR | Status: AC
  Administered 2018-08-23: 15 mg via INTRAVENOUS

## 2018-08-23 MED ORDER — HEPARIN (PORCINE) IN NACL 2-0.9 UNITS/ML
INTRAMUSCULAR | Status: AC | PRN
  Administered 2018-08-23: 1 via INTRAVENOUS

## 2018-08-23 MED ORDER — OXYCODONE HCL 5 MG PO TABS: 5.0000 mg | ORAL_TABLET | Freq: Once | ORAL | Status: DC | PRN

## 2018-08-23 MED ORDER — HEPARIN (PORCINE) IN NACL 1000-0.9 UT/500ML-% IV SOLN
INTRAVENOUS | Status: AC
  Filled 2018-08-23: qty 500

## 2018-08-23 MED ORDER — OXYCODONE HCL 5 MG/5ML PO SOLN: 5.0000 mg | Freq: Once | ORAL | Status: DC | PRN

## 2018-08-23 MED ORDER — HEPARIN SOD (PORK) LOCK FLUSH 100 UNIT/ML IV SOLN
INTRAVENOUS | Status: AC
  Filled 2018-08-23: qty 5

## 2018-08-23 MED ORDER — MEPERIDINE HCL 25 MG/ML IJ SOLN: 6.2500 mg | INTRAMUSCULAR | Status: DC | PRN

## 2018-08-23 MED ORDER — PROPOFOL 500 MG/50ML IV EMUL
INTRAVENOUS | Status: AC
  Filled 2018-08-23: qty 50

## 2018-08-23 MED ORDER — SCOPOLAMINE 1 MG/3DAYS TD PT72
MEDICATED_PATCH | TRANSDERMAL | Status: AC
  Filled 2018-08-23: qty 1

## 2018-08-23 MED ORDER — GABAPENTIN 100 MG PO CAPS
100.0000 mg | ORAL_CAPSULE | ORAL | Status: AC
  Administered 2018-08-23: 100 mg via ORAL

## 2018-08-23 MED ORDER — ACETAMINOPHEN 500 MG PO TABS
1000.0000 mg | ORAL_TABLET | ORAL | Status: AC
  Administered 2018-08-23: 1000 mg via ORAL

## 2018-08-23 MED ORDER — CEFAZOLIN SODIUM-DEXTROSE 2-4 GM/100ML-% IV SOLN
INTRAVENOUS | Status: AC
  Filled 2018-08-23: qty 100

## 2018-08-23 MED ORDER — EPHEDRINE SULFATE-NACL 50-0.9 MG/10ML-% IV SOSY
PREFILLED_SYRINGE | INTRAVENOUS | Status: DC | PRN
  Administered 2018-08-23 (×2): 10 mg via INTRAVENOUS

## 2018-08-23 MED ORDER — ONDANSETRON HCL 4 MG/2ML IJ SOLN
INTRAMUSCULAR | Status: DC | PRN
  Administered 2018-08-23: 4 mg via INTRAVENOUS

## 2018-08-23 MED ORDER — MIDAZOLAM HCL 2 MG/2ML IJ SOLN: 1.0000 mg | INTRAMUSCULAR | Status: DC | PRN

## 2018-08-23 MED ORDER — PROPOFOL 10 MG/ML IV BOLUS
INTRAVENOUS | Status: DC | PRN
  Administered 2018-08-23: 150 mg via INTRAVENOUS

## 2018-08-23 MED ORDER — METOCLOPRAMIDE HCL 5 MG/ML IJ SOLN: 10.0000 mg | Freq: Once | INTRAMUSCULAR | Status: DC | PRN

## 2018-08-23 SURGICAL SUPPLY — 51 items
BAG DECANTER FOR FLEXI CONT (MISCELLANEOUS) ×2 IMPLANT
BENZOIN TINCTURE PRP APPL 2/3 (GAUZE/BANDAGES/DRESSINGS) ×2 IMPLANT
BLADE SURG 11 STRL SS (BLADE) ×2 IMPLANT
BLADE SURG 15 STRL LF DISP TIS (BLADE) ×1 IMPLANT
BLADE SURG 15 STRL SS (BLADE) ×1
CANISTER SUCT 1200ML W/VALVE (MISCELLANEOUS) IMPLANT
CHLORAPREP W/TINT 26 (MISCELLANEOUS) ×2 IMPLANT
COVER BACK TABLE REUSABLE LG (DRAPES) ×2 IMPLANT
COVER MAYO STAND REUSABLE (DRAPES) ×2 IMPLANT
COVER PROBE 5X48 (MISCELLANEOUS)
COVER WAND RF STERILE (DRAPES) IMPLANT
DECANTER SPIKE VIAL GLASS SM (MISCELLANEOUS) IMPLANT
DERMABOND ADVANCED (GAUZE/BANDAGES/DRESSINGS) ×1
DERMABOND ADVANCED .7 DNX12 (GAUZE/BANDAGES/DRESSINGS) ×1 IMPLANT
DRAPE C-ARM 42X72 X-RAY (DRAPES) ×2 IMPLANT
DRAPE LAPAROSCOPIC ABDOMINAL (DRAPES) ×2 IMPLANT
DRAPE UTILITY XL STRL (DRAPES) ×2 IMPLANT
DRSG TEGADERM 4X4.75 (GAUZE/BANDAGES/DRESSINGS) IMPLANT
ELECT COATED BLADE 2.86 ST (ELECTRODE) ×2 IMPLANT
ELECT REM PT RETURN 9FT ADLT (ELECTROSURGICAL) ×2
ELECTRODE REM PT RTRN 9FT ADLT (ELECTROSURGICAL) ×1 IMPLANT
GAUZE SPONGE 4X4 12PLY STRL LF (GAUZE/BANDAGES/DRESSINGS) ×2 IMPLANT
GLOVE BIO SURGEON STRL SZ7 (GLOVE) ×2 IMPLANT
GLOVE BIOGEL PI IND STRL 7.0 (GLOVE) ×2 IMPLANT
GLOVE BIOGEL PI IND STRL 7.5 (GLOVE) ×1 IMPLANT
GLOVE BIOGEL PI INDICATOR 7.0 (GLOVE) ×2
GLOVE BIOGEL PI INDICATOR 7.5 (GLOVE) ×1
GLOVE ECLIPSE 6.5 STRL STRAW (GLOVE) ×2 IMPLANT
GOWN STRL REUS W/ TWL LRG LVL3 (GOWN DISPOSABLE) ×2 IMPLANT
GOWN STRL REUS W/TWL LRG LVL3 (GOWN DISPOSABLE) ×2
IV KIT MINILOC 20X1 SAFETY (NEEDLE) IMPLANT
KIT CVR 48X5XPRB PLUP LF (MISCELLANEOUS) IMPLANT
KIT PORT POWER 8FR ISP CVUE (Port) ×2 IMPLANT
NDL SAFETY ECLIPSE 18X1.5 (NEEDLE) IMPLANT
NEEDLE HYPO 18GX1.5 SHARP (NEEDLE)
NEEDLE HYPO 25X1 1.5 SAFETY (NEEDLE) ×2 IMPLANT
PACK BASIN DAY SURGERY FS (CUSTOM PROCEDURE TRAY) ×2 IMPLANT
PENCIL BUTTON HOLSTER BLD 10FT (ELECTRODE) ×2 IMPLANT
SLEEVE SCD COMPRESS KNEE MED (MISCELLANEOUS) ×2 IMPLANT
STRIP CLOSURE SKIN 1/2X4 (GAUZE/BANDAGES/DRESSINGS) ×2 IMPLANT
SUT MNCRL AB 4-0 PS2 18 (SUTURE) ×2 IMPLANT
SUT PROLENE 2 0 SH DA (SUTURE) ×2 IMPLANT
SUT SILK 2 0 TIES 17X18 (SUTURE)
SUT SILK 2-0 18XBRD TIE BLK (SUTURE) IMPLANT
SUT VIC AB 3-0 SH 27 (SUTURE) ×1
SUT VIC AB 3-0 SH 27X BRD (SUTURE) ×1 IMPLANT
SYR 5ML LUER SLIP (SYRINGE) ×2 IMPLANT
SYR CONTROL 10ML LL (SYRINGE) ×2 IMPLANT
TOWEL GREEN STERILE FF (TOWEL DISPOSABLE) ×2 IMPLANT
TUBE CONNECTING 20X1/4 (TUBING) IMPLANT
YANKAUER SUCT BULB TIP NO VENT (SUCTIONS) IMPLANT

## 2018-08-23 NOTE — Discharge Instructions (Signed)
PORT-A-CATH: POST OP INSTRUCTIONS  Always review your discharge instruction sheet given to you by the facility where your surgery was performed.   1. A prescription for pain medication may be given to you upon discharge. Take your pain medication as prescribed, if needed. If narcotic pain medicine is not needed, then you make take acetaminophen (Tylenol) or ibuprofen (Advil) as needed.  2. Take your usually prescribed medications unless otherwise directed. 3. If you need a refill on your pain medication, please contact our office. All narcotic pain medicine now requires a paper prescription.  Phoned in and fax refills are no longer allowed by law.  Prescriptions will not be filled after 5 pm or on weekends.  4. You should follow a light diet for the remainder of the day after your procedure. 5. Most patients will experience some mild swelling and/or bruising in the area of the incision. It may take several days to resolve. 6. It is common to experience some constipation if taking pain medication after surgery. Increasing fluid intake and taking a stool softener (such as Colace) will usually help or prevent this problem from occurring. A mild laxative (Milk of Magnesia or Miralax) should be taken according to package directions if there are no bowel movements after 48 hours.  7. Unless discharge instructions indicate otherwise, you may remove your bandages 48 hours after surgery, and you may shower at that time. You may have steri-strips (small white skin tapes) in place directly over the incision.  These strips should be left on the skin for 7-10 days.  If your surgeon used Dermabond (skin glue) on the incision, you may shower in 24 hours.  The glue will flake off over the next 2-3 weeks.  8. If your port is left accessed at the end of surgery (needle left in port), the dressing cannot get wet and should only by changed by a healthcare professional. When the port is no longer accessed (when the  needle has been removed), follow step 7.   9. ACTIVITIES:  Limit activity involving your arms for the next 72 hours. Do no strenuous exercise or activity for 1 week. You may drive when you are no longer taking prescription pain medication, you can comfortably wear a seatbelt, and you can maneuver your car. 10.You may need to see your doctor in the office for a follow-up appointment.  Please       check with your doctor.  11.When you receive a new Port-a-Cath, you will get a product guide and        ID card.  Please keep them in case you need them.  WHEN TO CALL YOUR DOCTOR [phone]): 1. Fever over 101.0 2. Chills 3. Continued bleeding from incision 4. Increased redness and tenderness at the site 5. Shortness of breath, difficulty breathing   The clinic staff is available to answer your questions during regular business hours. Please dont hesitate to call and ask to speak to one of the nurses or medical assistants for clinical concerns. If you have a medical emergency, go to the nearest emergency room or call 911.  A surgeon from [facility] is always on call at the hospital.     For further information, please visit [email or web address]    Post Anesthesia Home Care Instructions  Activity: Get plenty of rest for the remainder of the day. A responsible individual must stay with you for 24 hours following the procedure.  For the next 24 hours, DO NOT: -Drive a  car -Paediatric nurse -Drink alcoholic beverages -Take any medication unless instructed by your physician -Make any legal decisions or sign important papers.  Meals: Start with liquid foods such as gelatin or soup. Progress to regular foods as tolerated. Avoid greasy, spicy, heavy foods. If nausea and/or vomiting occur, drink only clear liquids until the nausea and/or vomiting subsides. Call your physician if vomiting continues.  Special Instructions/Symptoms: Your throat may feel dry or sore  from the anesthesia or the breathing tube placed in your throat during surgery. If this causes discomfort, gargle with warm salt water. The discomfort should disappear within 24 hours.  If you had a scopolamine patch placed behind your ear for the management of post- operative nausea and/or vomiting:  1. The medication in the patch is effective for 72 hours, after which it should be removed.  Wrap patch in a tissue and discard in the trash. Wash hands thoroughly with soap and water. 2. You may remove the patch earlier than 72 hours if you experience unpleasant side effects which may include dry mouth, dizziness or visual disturbances. 3. Avoid touching the patch. Wash your hands with soap and water after contact with the patch.     You received toradol IV at 8:25 am please wait until after 2:25 pm to take ibuprofen if needed.

## 2018-08-23 NOTE — Anesthesia Preprocedure Evaluation (Signed)
Anesthesia Evaluation  Patient identified by MRN, date of birth, ID band Patient awake    Reviewed: Allergy & Precautions, NPO status , Patient's Chart, lab work & pertinent test results  History of Anesthesia Complications (+) PONV and history of anesthetic complications  Airway Mallampati: I  TM Distance: >3 FB Neck ROM: Full    Dental no notable dental hx.    Pulmonary neg pulmonary ROS,    Pulmonary exam normal breath sounds clear to auscultation       Cardiovascular + DVT  Normal cardiovascular exam Rhythm:Regular Rate:Normal     Neuro/Psych  Headaches, Seizures -, Well Controlled,  PSYCHIATRIC DISORDERS Anxiety Depression ADD Neuromuscular disease    GI/Hepatic negative GI ROS, Neg liver ROS,   Endo/Other  Left Breast Ca with metastasis to bone S/P bilateral mastectomies S/P RT  Renal/GU negative Renal ROS  negative genitourinary   Musculoskeletal   Abdominal   Peds  Hematology  (+) anemia ,   Anesthesia Other Findings   Reproductive/Obstetrics                             Anesthesia Physical Anesthesia Plan  ASA: II  Anesthesia Plan: General   Post-op Pain Management:    Induction: Intravenous  PONV Risk Score and Plan: 4 or greater and Scopolamine patch - Pre-op, Midazolam, Ondansetron, Treatment may vary due to age or medical condition and Dexamethasone  Airway Management Planned: LMA  Additional Equipment:   Intra-op Plan:   Post-operative Plan:   Informed Consent: I have reviewed the patients History and Physical, chart, labs and discussed the procedure including the risks, benefits and alternatives for the proposed anesthesia with the patient or authorized representative who has indicated his/her understanding and acceptance.     Dental advisory given  Plan Discussed with: CRNA and Surgeon  Anesthesia Plan Comments:         Anesthesia Quick  Evaluation

## 2018-08-23 NOTE — Anesthesia Postprocedure Evaluation (Signed)
Anesthesia Post Note  Patient: Laura Anthony  Procedure(s) Performed: INSERTION PORT-A-CATH WITH ULTRASOUND (N/A Chest)     Patient location during evaluation: PACU Anesthesia Type: General Level of consciousness: awake and alert and oriented Pain management: pain level controlled Vital Signs Assessment: post-procedure vital signs reviewed and stable Respiratory status: spontaneous breathing, nonlabored ventilation and respiratory function stable Cardiovascular status: blood pressure returned to baseline and stable Postop Assessment: no apparent nausea or vomiting Anesthetic complications: no    Last Vitals:  Vitals:   08/23/18 1045 08/23/18 1100  BP: 123/78 116/81  Pulse: 86 75  Resp: 14 10  Temp:    SpO2: 100% 100%    Last Pain:  Vitals:   08/23/18 1100  TempSrc:   PainSc: 0-No pain                 Taras Rask A.

## 2018-08-23 NOTE — H&P (Signed)
Laura Anthony is an 43 y.o. female.   Chief Complaint: breast cancer HPI: 84 yof with stage IV breast cancer in need of systemic therapy.  I have treated before with bilateral nsm and sn biopsy after primary systemic therapy. She had done well until she developed elbow pain that appears to be metastasis. She was then evaluated and found to have stage IV disease confirmed with lung biopsy. She is here for port placement today  Past Medical History:  Diagnosis Date  . ADD (attention deficit disorder)   . Anemia   . Anxiety   . Breast cancer, left breast ([place])    S/P mastectomy 03/27/2017  . DVT (deep venous thrombosis) ([place]) 2017   calf left - probably due to [facility] pills-took eliquis x3 mos, nonthing now  . High cholesterol   . Impingement syndrome of right shoulder 07/2013  . Migraine    "usually 1/month" (03/28/2017)  . PONV (postoperative nausea and vomiting)   . Right bicipital tenosynovitis 07/2013  . Rotator cuff impingement syndrome of right shoulder 07/12/2013  . Seizures ([place])    x 1 as a child - was never on anticonvulsants (03/28/2017)    Past Surgical History:  Procedure Laterality Date  . ADENOIDECTOMY  1981  . ANKLE ARTHROSCOPY Right   . BREAST BIOPSY Left 10/2016  . KNEE ARTHROSCOPY Right   . KNEE ARTHROSCOPY W/ ACL RECONSTRUCTION Left   . LIPOSUCTION WITH LIPOFILLING Bilateral 01/05/2018   Procedure: LIPOFILLING FROM ABDOMEN TO BILATERAL CHEST;  Surgeon: Irene Limbo, MD;  Location: [facility];  Service: Plastics;  Laterality: Bilateral;  . MASTECTOMY Left 03/27/2017   NIPPLE SPARING MASTECTOMY WITH RADIOACTIVE SEED TARGETED LYMPH NODE EXCISION AND LEFT AXILLARY SENTINEL LYMPH NODE BIOPSY  . MASTECTOMY Right 03/27/2017   RIGHT PROPHYLACTIC NIPPLE SPARING MASTECTOMY  . NIPPLE SPARING MASTECTOMY Right 03/27/2017   Procedure: RIGHT PROPHYLACTIC NIPPLE SPARING MASTECTOMY;  Surgeon: Rolm Bookbinder, MD;  Location: [facility];   Service: General;  Laterality: Right;  . PORT-A-CATH REMOVAL Right 01/05/2018   Procedure: REMOVAL RIGHT CHEST PORT;  Surgeon: Irene Limbo, MD;  Location: [place];  Service: Plastics;  Laterality: Right;  . PORTACATH PLACEMENT N/A 11/01/2016   Procedure: INSERTION PORT-A-CATH WITH [place];  Surgeon: Rolm Bookbinder, MD;  Location: [place];  Service: General;  Laterality: N/A;  . RADIOACTIVE SEED GUIDED AXILLARY SENTINEL LYMPH NODE Left 03/27/2017   Procedure: LEFT NIPPLE SPARING MASTECTOMY WITH RADIOACTIVE SEED TARGETED LYMPH NODE EXCISION AND LEFT AXILLARY SENTINEL LYMPH NODE BIOPSY;  Surgeon: Rolm Bookbinder, MD;  Location: [facility];  Service: General;  Laterality: Left;  REQUESTS RNFA  . RECONSTRUCTION BREAST IMMEDIATE / DELAYED W/ TISSUE EXPANDER Bilateral 03/27/2017   BILATERAL BREAST RECONSTRUCTION WITH PLACEMENT OF TISSUE EXPANDER AND ALLODERM  . REMOVAL OF BILATERAL TISSUE EXPANDERS WITH PLACEMENT OF BILATERAL BREAST IMPLANTS Bilateral 01/05/2018   Procedure: REMOVAL OF BILATERAL TISSUE EXPANDERS WITH PLACEMENT OF BILATERAL BREAST IMPLANTS;  Surgeon: Irene Limbo, MD;  Location: [facility];  Service: Plastics;  Laterality: Bilateral;  . SEPTOPLASTY WITH ETHMOIDECTOMY, AND MAXILLARY ANTROSTOMY  10/29/2010   bilat. max. antrostomy with left max. stripping; left ant. ethmoidectomy; right total ethmoidectomy; sphenoidotomy  . SHOULDER ARTHROSCOPY WITH SUBACROMIAL DECOMPRESSION AND BICEP TENDON REPAIR Right 07/12/2013   Procedure: RIGHT SHOULDER ARTHROSCOPY DEBRIDEMENT EXTENSIVE DECOMPRESSION SUBACROMIAL PARTIAL ACROMIOPLASTY;  Surgeon: Johnny Bridge, MD;  Location: [facility];  Service: Orthopedics;  Laterality: Right;  . WRIST ARTHROSCOPY  01/17/2012  Procedure: ARTHROSCOPY WRIST; right wrist Surgeon: Tennis Must, MD;  Location: [facility];  Service: Orthopedics;  Laterality: Right;  RIGHT WRIST ARTHROSCOPY  WITH TRIANGULAR FIBROCARTILAGE COMPLEX REPAIR AND DEBRIDEMENT     Family History  Problem Relation Age of Onset  . Breast cancer Mother 18       triple negative  . Leukemia Father   . Lung cancer Father   . Heart attack Maternal Uncle   . Prostate cancer Paternal Uncle   . COPD Paternal Grandmother   . Heart disease Paternal Grandfather   . Prostate cancer Paternal Uncle   . Leukemia Cousin    Social History:  reports that she has never smoked. She has never used smokeless tobacco. She reports current alcohol use. She reports that she does not use drugs.  Allergies:  Allergies  Allergen Reactions  . Sumatriptan Other (See Comments)    Numbness to face   . Adhesive [Tape] Rash    Rash from dressing over port-a-cath  . Statins Other (See Comments)    Leg pain     Medications Prior to Admission  Medication Sig Dispense Refill  . fentaNYL (DURAGESIC) 25 MCG/HR Place 1 patch onto the skin every 3 (three) days. 10 patch 0  . FLUoxetine (PROZAC) 40 MG capsule Take 40 mg by mouth every morning.     . lisdexamfetamine (VYVANSE) 30 MG capsule Take 30 mg by mouth daily.     Marland Kitchen LORazepam (ATIVAN) 1 MG tablet Take 1 tablet (1 mg total) by mouth as needed. (Patient taking differently: Take 1 mg by mouth 2 (two) times daily as needed for anxiety. ) 30 tablet 2  . oxyCODONE (OXY IR/ROXICODONE) 5 MG immediate release tablet Take 1-2 tablets (5-10 mg total) by mouth every 4 (four) hours as needed for severe pain. 60 tablet 0  . rizatriptan (MAXALT) 10 MG tablet Take 10 mg by mouth daily as needed for migraine. May repeat in 2 hours if needed     . spironolactone (ALDACTONE) 100 MG tablet Take 100 mg by mouth daily.     . predniSONE (DELTASONE) 10 MG tablet Take 6 tabs po daily x 5 days, 5 tabs po x 2 days, 4 tabs po x 2 days, 3 tabs po x 2 days, 2 tabs po x 2 days, 1 tab po x 2 days, then dc 60 tablet 0    No results found for this or any previous visit (from the past 48 hour(s)). No results  found.  Review of Systems  Constitutional: Positive for malaise/fatigue.  Musculoskeletal: Positive for back pain.  All other systems reviewed and are negative.   Blood pressure 101/69, pulse 67, temperature 98.5 F (36.9 C), temperature source Oral, height 5\' 7"  (1.702 m), weight 70.7 kg, last menstrual period 08/05/2018, SpO2 97 %. Physical Exam  Constitutional: She appears well-developed and well-nourished.  Neck: Neck supple.  Cardiovascular: Normal rate and regular rhythm.  Respiratory: Breath sounds normal. No respiratory distress.     Assessment/Plan Stage IV breast cancer Port placement today, begin systemic therapy next week  Rolm Bookbinder, MD 08/23/2018, 9:22 AM

## 2018-08-23 NOTE — Interval H&P Note (Signed)
History and Physical Interval Note:  08/23/2018 9:24 AM  Laura Anthony  has presented today for surgery, with the diagnosis of BREAST CANCER.  The various methods of treatment have been discussed with the patient and family. After consideration of risks, benefits and other options for treatment, the patient has consented to  Procedure(s): INSERTION PORT-A-CATH WITH ULTRASOUND (N/A) as a surgical intervention.  The patient's history has been reviewed, patient examined, no change in status, stable for surgery.  I have reviewed the patient's chart and labs.  Questions were answered to the patient's satisfaction.     Rolm Bookbinder

## 2018-08-23 NOTE — Op Note (Signed)
Preoperative diagnosis:stage IV breast cancer Postoperative diagnosis: Same as above Procedure: Right internal jugular port placement with ultrasound guidance Surgeon: Dr. Serita Grammes Anesthesia: General Estimate blood loss: Minimal Complications: None Drains: None Specimens: None Special count was correct at completion Disposition to recovery stable condition  Indications: This a 26 yof I know well from breast cancer. She now has stage IV disease. Due to start systemic therapy next week. Discussed port placement  Procedure: After informed consent was obtained the patient was taken to the operating room. She was given antibiotics. SCDs were in place. She was then placed under general anesthesia without complications. She was padded and her arms placed at her side. She was then prepped and draped in the standard sterile surgical fashion. Surgical timeout was then performed.  I used the ultrasound to identify the right internal jugular vein.  I then was able to access the internal jugular vein with a needle on the first pass. I then placed the wire. The wire was confirmed to be in the vein by ultrasound. I then did fluoroscopy and the wire was in the correct position as well. I then infiltrated Marcaine in her upper right chest and made an incision.this was the site of her prior port I developed a pocket below this.I then used the [employer] to create the tunnel and brought the line from the neck incision to the chest wall. I then placed the dilator over the wire under fluoroscopic vision. I removed the wire assembly. I then placed a line through the dilator and removed the peel-away sheath. I pulled this back to be in the superior vena cava. I then hooked this up to the port. I sutured the port down with 2-0 Prolene suture. The port flushed easily and withdrew blood. I then placed heparin in the port. I then closed this with 3-0 Vicryl, 4-0 Monocryl, and glue. I  placed a dressing overlying all of this. She tolerated this well and was transferred to recovery.

## 2018-08-23 NOTE — Anesthesia Procedure Notes (Signed)
Procedure Name: LMA Insertion Date/Time: 08/23/2018 9:39 AM Performed by: Genelle Bal, CRNA Pre-anesthesia Checklist: Patient identified, Emergency Drugs available, Suction available and Patient being monitored Patient Re-evaluated:Patient Re-evaluated prior to induction Oxygen Delivery Method: Circle system utilized Preoxygenation: Pre-oxygenation with 100% oxygen Induction Type: IV induction Ventilation: Mask ventilation without difficulty LMA: LMA inserted LMA Size: 4.0 Number of attempts: 1 Airway Equipment and Method: Bite block Placement Confirmation: positive ETCO2 Tube secured with: Tape Dental Injury: Teeth and Oropharynx as per pre-operative assessment

## 2018-08-23 NOTE — Transfer of Care (Signed)
Immediate Anesthesia Transfer of Care Note  Patient: Laura Anthony  Procedure(s) Performed: INSERTION PORT-A-CATH WITH ULTRASOUND (N/A Chest)  Patient Location: PACU  Anesthesia Type:General  Level of Consciousness: drowsy and patient cooperative  Airway & Oxygen Therapy: Patient Spontanous Breathing and Patient connected to face mask oxygen  Post-op Assessment: Report given to RN and Post -op Vital signs reviewed and stable  Post vital signs: Reviewed and stable  Last Vitals:  Vitals Value Taken Time  BP 102/62   Temp    Pulse 76 08/23/18 1025  Resp 10 08/23/18 1025  SpO2 100 % 08/23/18 1025  Vitals shown include unvalidated device data.  Last Pain:  Vitals:   08/23/18 0803  TempSrc: Oral  PainSc: 5          Complications: No apparent anesthesia complications

## 2018-08-24 ENCOUNTER — Ambulatory Visit
Admission: RE | Admit: 2018-08-24 | Discharge: 2018-08-24 | Disposition: A | Discharge status: Home / Self Care | Payer: 59 | Source: Ambulatory Visit | Attending: Radiation Oncology | Admitting: Radiation Oncology

## 2018-08-24 ENCOUNTER — Encounter (HOSPITAL_BASED_OUTPATIENT_CLINIC_OR_DEPARTMENT_OTHER): Payer: Self-pay | Admitting: General Surgery

## 2018-08-24 ENCOUNTER — Telehealth: Payer: Self-pay | Admitting: *Deleted

## 2018-08-24 ENCOUNTER — Other Ambulatory Visit: Payer: Self-pay

## 2018-08-24 DIAGNOSIS — C7951 Secondary malignant neoplasm of bone: Secondary | ICD-10-CM | POA: Diagnosis not present

## 2018-08-24 MED ORDER — LIDOCAINE-PRILOCAINE 2.5-2.5 % EX CREA: 1.0000 "application " | TOPICAL_CREAM | CUTANEOUS | 0 refills | Status: DC | PRN

## 2018-08-24 NOTE — Telephone Encounter (Signed)
Reach out to [facility] regarding update on testing results. Informed it is in the initial phases of testing and that they are aware it is a stat read.

## 2018-08-27 ENCOUNTER — Other Ambulatory Visit: Payer: Self-pay | Admitting: Hematology and Oncology

## 2018-08-27 ENCOUNTER — Other Ambulatory Visit: Payer: Self-pay

## 2018-08-27 ENCOUNTER — Ambulatory Visit
Admission: RE | Admit: 2018-08-27 | Discharge: 2018-08-27 | Disposition: A | Discharge status: Home / Self Care | Payer: 59 | Source: Ambulatory Visit | Attending: Radiation Oncology | Admitting: Radiation Oncology

## 2018-08-27 DIAGNOSIS — C50512 Malignant neoplasm of lower-outer quadrant of left female breast: Secondary | ICD-10-CM

## 2018-08-27 DIAGNOSIS — C7951 Secondary malignant neoplasm of bone: Secondary | ICD-10-CM | POA: Diagnosis not present

## 2018-08-27 DIAGNOSIS — Z171 Estrogen receptor negative status [ER-]: Secondary | ICD-10-CM

## 2018-08-27 NOTE — Progress Notes (Signed)
CT-guided lung biopsy will be repeated because we do not have enough material to do molecular testing. I discussed the case with Dr. Pascal Lux who reviewed the images and felt that the original biopsy itself was very difficult however he was willing to consider repeating this again because we do not have any other lesion to obtain enough tissue for molecular testing. Bone unfortunately is a very poor biopsy material.

## 2018-08-28 ENCOUNTER — Ambulatory Visit
Admission: RE | Admit: 2018-08-28 | Discharge: 2018-08-28 | Disposition: A | Discharge status: Home / Self Care | Payer: 59 | Source: Ambulatory Visit | Attending: Radiation Oncology | Admitting: Radiation Oncology

## 2018-08-28 DIAGNOSIS — C7951 Secondary malignant neoplasm of bone: Secondary | ICD-10-CM | POA: Diagnosis not present

## 2018-08-29 ENCOUNTER — Ambulatory Visit
Admission: RE | Admit: 2018-08-29 | Discharge: 2018-08-29 | Disposition: A | Discharge status: Home / Self Care | Payer: 59 | Source: Ambulatory Visit | Attending: Radiation Oncology | Admitting: Radiation Oncology

## 2018-08-29 ENCOUNTER — Other Ambulatory Visit: Payer: Self-pay

## 2018-08-29 DIAGNOSIS — C7951 Secondary malignant neoplasm of bone: Secondary | ICD-10-CM | POA: Diagnosis not present

## 2018-08-30 ENCOUNTER — Ambulatory Visit
Admission: RE | Admit: 2018-08-30 | Discharge: 2018-08-30 | Disposition: A | Discharge status: Home / Self Care | Payer: 59 | Source: Ambulatory Visit | Attending: Radiation Oncology | Admitting: Radiation Oncology

## 2018-08-30 ENCOUNTER — Other Ambulatory Visit: Payer: Self-pay

## 2018-08-30 DIAGNOSIS — C7951 Secondary malignant neoplasm of bone: Secondary | ICD-10-CM | POA: Diagnosis not present

## 2018-08-31 ENCOUNTER — Other Ambulatory Visit: Payer: Self-pay

## 2018-08-31 ENCOUNTER — Ambulatory Visit
Admission: RE | Admit: 2018-08-31 | Discharge: 2018-08-31 | Disposition: A | Discharge status: Home / Self Care | Payer: 59 | Source: Ambulatory Visit | Attending: Radiation Oncology | Admitting: Radiation Oncology

## 2018-08-31 DIAGNOSIS — C7951 Secondary malignant neoplasm of bone: Secondary | ICD-10-CM | POA: Diagnosis not present

## 2018-09-03 ENCOUNTER — Other Ambulatory Visit: Payer: Self-pay

## 2018-09-03 ENCOUNTER — Ambulatory Visit
Admission: RE | Admit: 2018-09-03 | Discharge: 2018-09-03 | Disposition: A | Discharge status: Home / Self Care | Payer: 59 | Source: Ambulatory Visit | Attending: Radiation Oncology | Admitting: Radiation Oncology

## 2018-09-03 DIAGNOSIS — C7951 Secondary malignant neoplasm of bone: Secondary | ICD-10-CM | POA: Diagnosis present

## 2018-09-03 DIAGNOSIS — Z510 Encounter for antineoplastic radiation therapy: Secondary | ICD-10-CM | POA: Insufficient documentation

## 2018-09-03 DIAGNOSIS — C50512 Malignant neoplasm of lower-outer quadrant of left female breast: Secondary | ICD-10-CM | POA: Insufficient documentation

## 2018-09-03 DIAGNOSIS — Z171 Estrogen receptor negative status [ER-]: Secondary | ICD-10-CM | POA: Insufficient documentation

## 2018-09-04 ENCOUNTER — Ambulatory Visit: Payer: 59

## 2018-09-04 ENCOUNTER — Encounter: Payer: Self-pay | Admitting: *Deleted

## 2018-09-04 ENCOUNTER — Other Ambulatory Visit: Payer: Self-pay

## 2018-09-04 ENCOUNTER — Ambulatory Visit
Admission: RE | Admit: 2018-09-04 | Discharge: 2018-09-04 | Disposition: A | Discharge status: Home / Self Care | Payer: 59 | Source: Ambulatory Visit | Attending: Radiation Oncology | Admitting: Radiation Oncology

## 2018-09-04 DIAGNOSIS — C7951 Secondary malignant neoplasm of bone: Secondary | ICD-10-CM | POA: Diagnosis not present

## 2018-09-05 ENCOUNTER — Ambulatory Visit
Admission: RE | Admit: 2018-09-05 | Discharge: 2018-09-05 | Disposition: A | Discharge status: Home / Self Care | Payer: 59 | Source: Ambulatory Visit | Attending: Radiation Oncology | Admitting: Radiation Oncology

## 2018-09-05 ENCOUNTER — Encounter: Payer: Self-pay | Admitting: Radiation Oncology

## 2018-09-05 ENCOUNTER — Other Ambulatory Visit: Payer: Self-pay

## 2018-09-05 DIAGNOSIS — C7951 Secondary malignant neoplasm of bone: Secondary | ICD-10-CM | POA: Diagnosis not present

## 2018-09-11 ENCOUNTER — Encounter: Payer: Self-pay | Admitting: *Deleted

## 2018-09-11 NOTE — Progress Notes (Signed)
Per request of pt, RN faxed medical records to [facility].  Successful fax notification received.

## 2018-09-14 ENCOUNTER — Ambulatory Visit
Admission: RE | Admit: 2018-09-14 | Discharge: 2018-09-14 | Disposition: A | Discharge status: Home / Self Care | Payer: 59 | Source: Ambulatory Visit | Attending: Adult Health | Admitting: Adult Health

## 2018-09-14 ENCOUNTER — Other Ambulatory Visit: Payer: Self-pay | Admitting: Adult Health

## 2018-09-14 DIAGNOSIS — C50912 Malignant neoplasm of unspecified site of left female breast: Secondary | ICD-10-CM

## 2018-09-21 NOTE — Progress Notes (Signed)
  Radiation Oncology         [phone] ________________________________  Name: Laura Anthony MRN: [id number]  Date: 08/07/2018  DOB: 1975-09-25  SIMULATION AND TREATMENT PLANNING NOTE  DIAGNOSIS:     ICD-10-CM   1. Bone metastases ([place])  C79.51      Site:  Right humerus  NARRATIVE:  The patient was brought to the [facility].  Identity was confirmed.  All relevant records and images related to the planned course of therapy were reviewed.   Written consent to proceed with treatment was confirmed which was freely given after reviewing the details related to the planned course of therapy had been reviewed with the patient.  Then, the patient was set-up in a stable reproducible  supine position for radiation therapy.  CT images were obtained.  Surface markings were placed.    Medically necessary complex treatment device(s) for immobilization:  Vac-lock bag.   The CT images were loaded into the planning software.  Then the target and avoidance structures were contoured.  Treatment planning then occurred.  The radiation prescription was entered and confirmed.  A total of 2 complex treatment devices were fabricated which relate to the designed radiation treatment fields. Each of these customized fields/ complex treatment devices will be used on a daily basis during the radiation course. I have requested : Isodose Plan.   PLAN:  The patient will receive 30 Gy in 10 fractions.  ________________________________   Jodelle Gross, MD, PhD

## 2018-10-10 ENCOUNTER — Telehealth: Payer: Self-pay | Admitting: Radiation Oncology

## 2018-10-10 NOTE — Telephone Encounter (Signed)
  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 This was delivered using a 3-D conformal, 4 field technique. The patient then received a boost to the mastectomy scar. This delivered an additional 10 Gy in 5 fractions using an en face electron field. The total dose was 60.4 Gy.  Narrative:  The patient was contacted today for routine follow-up. During treatment she did very well with radiotherapy and did not have significant desquamation. She reports she is now at [facility] for a clinical trial and has enrolled and received her first cycle. She reports her right elbow and right rib area are doing better. She is having pain along the bra strap distribution more on the right as well. I went back and looked at her planning films, and the most recent treatment was truly just the rib, not the vertebral body below, nor extending higher up at the 8th rib on the right where a recent bone scan confirmed disease along with left humerus and manubrium. She does have pain that radiates along the chest wall, and some discomfort in the left humerus but is hopeful to continue with her protocol without interruption.  Impression/Plan: 1. 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 The patient seems to be doing better  since completing her recent radiotherapy. She will continue with the oncology team at [facility]. If her symptoms progress or do not improve with her trial therapy, we could consider palliating other sites, but hopefully her systemic therapy will address this and she can remain on trial. She will let us know how things are going and we'd be happy to see her back as needed.     Carola Rhine, PAC

## 2018-10-10 NOTE — Progress Notes (Signed)
  Radiation Oncology         [phone] ________________________________  Name: Laura Anthony MRN: [id number]  Date: 09/05/2018  DOB: [birth year]  End of Treatment Note  Diagnosis:   Stage IV breast cancer with bone metastasis     Indication for treatment::  palliative       Radiation treatment dates:   08/08/2018 through 09/05/2018  Site/dose:    1.  The patient was treated to the right elbow/distal humerus to a dose of 30 Gy in 10 fractions using a 2 field isodose plan. 2.  The patient was treated to the posterior/medial right 11th rib at the vertebral body junction to a dose of 30 Gy in 10 fractions using a 3 field 3D conformal technique.  Narrative: The patient tolerated radiation treatment relatively well.   The patient's pain significantly improved during treatment.  She did not have any significant difficulties during her course.  Plan: The patient has completed radiation treatment. The patient will return to radiation oncology clinic for routine followup in one month. I advised the patient to call or return sooner if they have any questions or concerns related to their recovery or treatment. ________________________________  Jodelle Gross, M.D., Ph.D.

## 2018-12-05 NOTE — Progress Notes (Signed)
Patient Care Team: [name], MD as PCP - General (Family Medicine) Mauro Kaufmann, RN as Oncology Nurse Navigator Rockwell Germany, RN as Oncology Nurse Navigator  DIAGNOSIS:    ICD-10-CM   1. Bone metastases (HCC)  C79.51   2. Malignant neoplasm of lower-outer quadrant of left breast of female, estrogen receptor negative ([place])  C50.512    Z17.1     SUMMARY OF ONCOLOGIC HISTORY: 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, estrogen receptor negative ([place])  10/20/2016 Mammogram   Mammogram and ultrasound of the left breast revealed 1.7 cm mass at 4:00 position, 6:30 position 5 x 4 x 4 mm mass, 6:00 position 5 cm nipple 7 x 6 x 11 mm, left axillary lymph node with thickened cortex, T1c N1 stage II a AJCC 8   10/24/2016 Initial Diagnosis   Left breast biopsy 6:30 position 3 cm from nipple: IDC grade 2, DCIS, ER 0%, PR 0%, Ki-67 15% HER-2 positive ratio 2.1; 4:00 position 3 cm from nipple: IDC grade 2, DCIS, ER 0%, PR 0%, Ki-67 35%, HER-2 positive ratio 2.02; left axillary lymph node biopsy positive   11/04/2016 - 02/17/2017 Neo-Adjuvant Chemotherapy   TCH Perjeta 6 cycles followed by Herceptin + Perjeta maintenance to be completed September 2019   11/30/2016 Genetic Testing   Negative genetic testing on the common hereditary cancer panel.  The Hereditary Gene Panel offered by [facility] includes sequencing and/or deletion duplication testing of the following 47 genes: APC, ATM, AXIN2, BARD1, BMPR1A, BRCA1, BRCA2, BRIP1, CDH1, CDK4, CDKN2A (p14ARF), CDKN2A (p16INK4a), CHEK2, CTNNA1, DICER1, EPCAM (Deletion/duplication testing only), GREM1 (promoter region deletion/duplication testing only), KIT, MEN1, MLH1, MSH2, MSH3, MSH6, MUTYH, NBN, NF1, NHTL1, PALB2, PDGFRA, PMS2, POLD1, POLE, PTEN, RAD50, RAD51C, RAD51D, SDHB, SDHC, SDHD, SMAD4, SMARCA4. STK11, TP53, TSC1, TSC2, and VHL.  The following genes were evaluated for sequence changes only: SDHA  and HOXB13 c.251G>A variant only. The report date is November 30, 2016.    03/27/2017 Surgery   Bilateral mastectomies: Left mastectomy: IDC grade 2 0.9 cm, nodes negative, right mastectomy benign, ER 0%, PR 0%, HER-2 positive ratio 2.6   05/08/2017 - 06/09/2017 Radiation Therapy   Adjuvant radiation therapy   10/23/2017 Miscellaneous   Neratinib discontinued after 4 weeks for severe diarrhea   07/25/2018 Relapse/Recurrence   MRI of right elbow showed bone lesion consistent with malignancy. PET scan showed bilateral pulmonary nodules and several lytic bone lesions compatible with metastatic disease. Brain MRI on 08/02/18 showed no evidence of metastatic disease.   08/02/2018 PET scan   Bilateral hypermetabolic lung nodules, LUL 1.3 cm with SUV 3.88, lingular nodule 1.4 cm SUV 3.7, central lingular nodule 1.2 cm SUV 9.76, right middle lobe nodule 1.5 cm SUV 9.9, lytic bone metastases inferior pubic ramus, sacrum, T12, right 11th rib.   08/08/2018 Procedure   Lung biopsy: metastatic carcinoma, HER-2 negative (0), ER/PR negative.   08/10/2018 -  Radiation Therapy   Palliative radiatio to the right humerus along the medial condyle   08/24/2018 - 09/05/2018 Radiation Therapy   Palliative radiation to the right 11th rib and right elbow   09/26/2018 - 12/04/2018 Chemotherapy   Carboplatin atezolizumab at [facility] with Dr. Janan Halter on [facility] clinical trial stopped because of new T5 metastases (toxicities included myopathy required prednisone, immune mediated thyroiditis), right upper extremity DVT on apixaban     CHIEF COMPLIANT: Follow-up of metastatic breast cancer  INTERVAL HISTORY: Belissa Kooy is  a 43 y.o. with above-mentioned history of metastatic breast cancer with metastases in the lungs and bone. She is currently enrolled in a clinical trial at [facility]. She presents to the clinic today for follow-up.  She is complaining of mid upper back pain which is new and getting worse.  She  is taking oxycodone 10 mg every 8 hours along with MS Contin 15 p.o. twice daily.  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  She will need to get started on thyroid replacement therapy.  She is here today accompanied by her husband.  Her goal is quality of life.  REVIEW OF SYSTEMS:   Constitutional: Denies fevers, chills or abnormal weight loss Eyes: Denies blurriness of vision Ears, nose, mouth, throat, and face: Denies mucositis or sore throat Respiratory: Denies cough, dyspnea or wheezes Cardiovascular: Denies palpitation, chest discomfort Gastrointestinal: Denies nausea, heartburn or change in bowel habits Skin: Denies abnormal skin rashes Lymphatics: Denies new lymphadenopathy or easy bruising Neurological: Denies numbness, tingling or new weaknesses, complaining of mid upper back pain Behavioral/Psych: Mood is stable, no new changes  Extremities: No lower extremity edema Breast: denies any pain or lumps or nodules in either breasts All other systems were reviewed with the patient and are negative.  I have reviewed the past medical history, past surgical history, social history and family history with the patient and they are unchanged from previous note.  ALLERGIES:  is allergic to sumatriptan; adhesive [tape]; and statins.  MEDICATIONS:  Current Outpatient Medications  Medication Sig Dispense Refill  . fentaNYL (DURAGESIC) 25 MCG/HR Place 1 patch onto the skin every 3 (three) days. 10 patch 0  . FLUoxetine (PROZAC) 40 MG capsule Take 40 mg by mouth every morning.     . lidocaine-prilocaine (EMLA) cream Apply 1 application topically as needed. 30 g 0  . lisdexamfetamine (VYVANSE) 30 MG capsule Take 30 mg by mouth daily.     Marland Kitchen LORazepam (ATIVAN) 1 MG tablet TAKE 1 TABLET BY MOUTH AS NEEDED 30 tablet 1  . oxyCODONE (OXY IR/ROXICODONE) 5 MG immediate release tablet Take 1-2 tablets (5-10 mg total) by mouth every 4 (four) hours as needed for  severe pain. 60 tablet 0  . predniSONE (DELTASONE) 10 MG tablet Take 6 tabs po daily x 5 days, 5 tabs po x 2 days, 4 tabs po x 2 days, 3 tabs po x 2 days, 2 tabs po x 2 days, 1 tab po x 2 days, then dc 60 tablet 0  . rizatriptan (MAXALT) 10 MG tablet Take 10 mg by mouth daily as needed for migraine. May repeat in 2 hours if needed     . spironolactone (ALDACTONE) 100 MG tablet Take 100 mg by mouth daily.      No current facility-administered medications for this visit.     PHYSICAL EXAMINATION: ECOG PERFORMANCE STATUS: 1 - Symptomatic but completely ambulatory  There were no vitals filed for this visit. There were no vitals filed for this visit.  GENERAL: alert, no distress and comfortable SKIN: skin color, texture, turgor are normal, no rashes or significant lesions EYES: normal, Conjunctiva are pink and non-injected, sclera clear OROPHARYNX: no exudate, no erythema and lips, buccal mucosa, and tongue normal  NECK: supple, thyroid normal size, non-tender, without nodularity LYMPH: no palpable lymphadenopathy in the cervical, axillary or inguinal LUNGS: clear to auscultation and percussion with normal breathing effort HEART: regular rate & rhythm and no murmurs and no lower extremity edema ABDOMEN: abdomen soft,  non-tender and normal bowel sounds MUSCULOSKELETAL: no cyanosis of digits and no clubbing  NEURO: alert & oriented x 3 with fluent speech, no focal motor/sensory deficits EXTREMITIES: No lower extremity edema  LABORATORY DATA:  I have reviewed the data as listed CMP Latest Ref Rng & Units 08/20/2018 11/21/2017 11/10/2017  Glucose 70 - 99 mg/dL 92 80 97  BUN 6 - 20 mg/dL _0 Creatinine 0.44 - 1.00 mg/dL 0.97 0.83 0.81  Sodium 135 - 145 mmol/L 138 139 138  Potassium 3.5 - 5.1 mmol/L 3.5 4.3 4.0  Chloride 98 - 111 mmol/L 101 106 107  CO2 22 - 32 mmol/L _1 Calcium 8.9 - 10.3 mg/dL 8.8(L) 9.2 8.9  Total Protein 6.5 - 8.1 g/dL - 7.4 7.2  Total Bilirubin 0.3 - 1.2  mg/dL - 0.3 0.2(L)  Alkaline Phos 38 - 126 U/L - 99 105  AST 15 - 41 U/L - 17 18  ALT 0 - 44 U/L - 14 12    Lab Results  Component Value Date   WBC 12.1 (H) 08/08/2018   HGB 13.3 08/08/2018   HCT 39.5 08/08/2018   MCV 95.4 08/08/2018   PLT 309 08/08/2018   NEUTROABS 4.4 11/21/2017    ASSESSMENT & PLAN:  Malignant neoplasm of lower-outer quadrant of left breast of female, estrogen receptor negative ([place]) Mammogram and ultrasound of the left breast revealed 1.7 cm mass at 4:00 position, 6:30 position 5 x 4 x 4 mm mass, 6:00 position 5 cm nipple 7 x 6 x 11 mm, left axillary lymph node with thickened cortex, T1c N1 stage II a AJCC 8  10/24/2016: Left breast biopsy 6:30 position 3 cm from nipple: IDC grade 2, DCIS, ER 0%, PR 0%, Ki-67 15% HER-2 positive ratio 2.1; 4:00 position 3 cm from nipple: IDC grade 2, DCIS, ER 0%, PR 0%, Ki-67 35%, HER-2 positive ratio 2.02 Lymph node biopsy positive  Treatment Summary: 1. Neoadjuvant chemotherapy with TCHPcompleted 02/17/2017 this would be followed by Herceptinand Perjetamaintenance for 1 yearcompleted September 2019 2.Bilateral mastectomies 03/28/2016:Bilateral mastectomies: Left mastectomy: IDC grade 2 0.9 cm, nodes negative, right mastectomy benign, ER 0%, PR 0%, HER-2 positive ratio 2.6 3.Adjuvant radiation4/08/2017 to 06/09/2017 4.  Neratinib started 10/12/2017 discontinued due to diarrhea 5.  Elbow fracture: Due to metastatic disease, palliative radiation therapy 6.  Carboplatin atezolizumab at [facility] on a clinical trial [facility] 043 stopped for progression 12/04/2018 ---------------------------------------------------------------------------------------------------------------- Lung nodule biopsy: Metastatic breast cancer triple negative (originally HER-2 positive)  CT CAP 12/03/2018: Progression of lung nodules and bone metastases done at [place] at [facility]: Taxol Herceptin Perjeta Treatment options:  Sacituzumab-Govitecan versus Halaven Herceptin Perjeta.   After discussing different options, we decided to do Halaven with Herceptin and Perjeta. I discussed the risks and benefits of Halaven and she understands this and is agreeable to proceed. We will plan to start chemotherapy next week.  Back pain: MRI of the upper back will be obtained.  Depending on that we may have to consult radiation oncology. Return to clinic in 1 week for starting therapy.  No orders of the defined types were placed in this encounter.  The patient has a good understanding of the overall plan. she agrees with it. she will call with any problems that may develop before the next visit here.  Nicholas Lose, MD 12/06/2018  Julious Oka Dorshimer am acting as scribe for Dr. Nicholas Lose.  I have reviewed the above documentation for accuracy  and completeness, and I agree with the above.       

## 2018-12-06 ENCOUNTER — Inpatient Hospital Stay: Payer: 59 | Attending: Hematology and Oncology | Admitting: Hematology and Oncology

## 2018-12-06 ENCOUNTER — Telehealth: Payer: Self-pay | Admitting: Hematology and Oncology

## 2018-12-06 ENCOUNTER — Other Ambulatory Visit: Payer: Self-pay | Admitting: Hematology and Oncology

## 2018-12-06 ENCOUNTER — Other Ambulatory Visit: Payer: Self-pay

## 2018-12-06 VITALS — BP 117/82 | HR 70 | Temp 98.3°F | Resp 20 | Ht 67.0 in | Wt 169.7 lb

## 2018-12-06 DIAGNOSIS — Z9013 Acquired absence of bilateral breasts and nipples: Secondary | ICD-10-CM | POA: Insufficient documentation

## 2018-12-06 DIAGNOSIS — Z79899 Other long term (current) drug therapy: Secondary | ICD-10-CM | POA: Insufficient documentation

## 2018-12-06 DIAGNOSIS — C7951 Secondary malignant neoplasm of bone: Secondary | ICD-10-CM | POA: Insufficient documentation

## 2018-12-06 DIAGNOSIS — G893 Neoplasm related pain (acute) (chronic): Secondary | ICD-10-CM | POA: Diagnosis not present

## 2018-12-06 DIAGNOSIS — Z7189 Other specified counseling: Secondary | ICD-10-CM | POA: Diagnosis not present

## 2018-12-06 DIAGNOSIS — Z5111 Encounter for antineoplastic chemotherapy: Secondary | ICD-10-CM | POA: Diagnosis not present

## 2018-12-06 DIAGNOSIS — Z9221 Personal history of antineoplastic chemotherapy: Secondary | ICD-10-CM | POA: Diagnosis not present

## 2018-12-06 DIAGNOSIS — C50512 Malignant neoplasm of lower-outer quadrant of left female breast: Secondary | ICD-10-CM | POA: Insufficient documentation

## 2018-12-06 DIAGNOSIS — Z923 Personal history of irradiation: Secondary | ICD-10-CM | POA: Diagnosis not present

## 2018-12-06 DIAGNOSIS — Z171 Estrogen receptor negative status [ER-]: Secondary | ICD-10-CM | POA: Diagnosis not present

## 2018-12-06 MED ORDER — ONDANSETRON HCL 8 MG PO TABS: 8.0000 mg | ORAL_TABLET | Freq: Two times a day (BID) | ORAL | 1 refills | Status: DC | PRN

## 2018-12-06 MED ORDER — PROCHLORPERAZINE MALEATE 10 MG PO TABS: 10.0000 mg | ORAL_TABLET | Freq: Four times a day (QID) | ORAL | 1 refills | Status: DC | PRN

## 2018-12-06 MED ORDER — OXYCODONE HCL 10 MG PO TABS: 10.0000 mg | ORAL_TABLET | Freq: Four times a day (QID) | ORAL | 0 refills | Status: DC | PRN

## 2018-12-06 MED ORDER — LIDOCAINE-PRILOCAINE 2.5-2.5 % EX CREA: TOPICAL_CREAM | CUTANEOUS | 3 refills | Status: DC

## 2018-12-06 MED ORDER — LEVOTHYROXINE SODIUM 88 MCG PO TABS: 88.0000 ug | ORAL_TABLET | Freq: Every day | ORAL | 1 refills | Status: DC

## 2018-12-06 NOTE — Progress Notes (Signed)
DISCONTINUE ON PATHWAY REGIMEN - Breast     A cycle is every 21 days:     Pertuzumab      Pertuzumab      Trastuzumab      Trastuzumab      Carboplatin      Docetaxel   **Always confirm dose/schedule in your pharmacy ordering system**  REASON: Disease Progression PRIOR TREATMENT: [id number]: Docetaxel + Carboplatin + Trastuzumab + Pertuzumab (TCHP) q21 Days x 6 Cycles TREATMENT RESPONSE: Progressive Disease (PD)  START OFF PATHWAY REGIMEN - Breast   [id number]:Eribulin 1.4 mg/m2 D1, 8 q21 Days:   A cycle is every 21 days:     Eribulin mesylate   **Always confirm dose/schedule in your pharmacy ordering system**  [id number]:Pertuzumab 420 mg IV + Trastuzumab 6 mg/kg IV q21 Days:   A cycle is every 21 days:     Trastuzumab-xxxx      Pertuzumab   **Always confirm dose/schedule in your pharmacy ordering system**  Patient Characteristics: Distant Metastases or Locoregional Recurrent Disease - Unresected or Locally Advanced Unresectable Disease Progressing after Neoadjuvant and Local Therapies, HER2 Positive, ER Negative/Unknown, Chemotherapy, Third Line Therapeutic Status: Distant Metastases BRCA Mutation Status: Absent ER Status: Negative (-) HER2 Status: Positive (+) PR Status: Negative (-) Line of Therapy: Third Line Intent of Therapy: Curative Intent, Discussed with Patient

## 2018-12-06 NOTE — Telephone Encounter (Signed)
I talk with patient regarding schedule  

## 2018-12-06 NOTE — Assessment & Plan Note (Signed)
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 Neoadjuvant chemotherapy with TCHPcompleted 02/17/2017 this would be followed by Herceptinand Perjetamaintenance for 1 yearcompleted September 2019 2.Bilateral mastectomies 03/28/2016:Bilateral mastectomies: Left mastectomy: IDC grade 2 0.9 cm, nodes negative, right mastectomy benign, ER 0%, PR 0%, HER-2 positive ratio 2.6 3.Adjuvant radiation4/08/2017 to 06/09/2017 4.  Neratinib started 10/12/2017 discontinued due to diarrhea 5.  Elbow fracture: Due to metastatic disease, palliative radiation therapy 6.  Carboplatin atezolizumab at [facility] on a clinical trial [facility] 043 stopped for progression 12/04/2018 ---------------------------------------------------------------------------------------------------------------- Lung nodule biopsy: Metastatic breast cancer triple negative  CT CAP 12/03/2018: Progression of lung nodules and bone metastases done at [place] at [facility]: Taxol Herceptin Perjeta Treatment options: Sacituzumab-Govitecan versus Kadcyla  Sacituzumab-Govitecan counseling: I discussed with the patient that this drug is an antibody drug conjugate. Its toxicities are related to neutropenia and diarrhea and hypersensitivity reactions primarily.  The range of adverse effects can be quite extensive including's edema, skin reactions, anemia, elevation of LFTs, dizziness fatigue joint pains and dyspnea.  Diarrhea can be quite prevalent and up to 62% of  patients.  Hypersensitivity reactions and 37%.  Grade 3 and 4 neutropenia and 26% of patients.  Phase 2 study showed one third of patients who got this drug had a response that lasted an average of 7.7 months.

## 2018-12-09 ENCOUNTER — Inpatient Hospital Stay (HOSPITAL_COMMUNITY)
Admission: EM | Admit: 2018-12-09 | Discharge: 2018-12-14 | DRG: 543 | Disposition: A | Discharge status: Home / Self Care | Payer: 59 | Attending: Student | Admitting: Student

## 2018-12-09 ENCOUNTER — Encounter (HOSPITAL_COMMUNITY): Payer: Self-pay | Admitting: Emergency Medicine

## 2018-12-09 ENCOUNTER — Ambulatory Visit (INDEPENDENT_AMBULATORY_CARE_PROVIDER_SITE_OTHER)
Admission: EM | Admit: 2018-12-09 | Discharge: 2018-12-09 | Disposition: A | Discharge status: Home / Self Care | Payer: 59 | Source: Home / Self Care | Attending: Family Medicine | Admitting: Family Medicine

## 2018-12-09 ENCOUNTER — Emergency Department (HOSPITAL_COMMUNITY): Payer: 59

## 2018-12-09 ENCOUNTER — Encounter (HOSPITAL_COMMUNITY): Payer: Self-pay

## 2018-12-09 ENCOUNTER — Other Ambulatory Visit: Payer: Self-pay

## 2018-12-09 DIAGNOSIS — Z803 Family history of malignant neoplasm of breast: Secondary | ICD-10-CM

## 2018-12-09 DIAGNOSIS — Z801 Family history of malignant neoplasm of trachea, bronchus and lung: Secondary | ICD-10-CM

## 2018-12-09 DIAGNOSIS — C50512 Malignant neoplasm of lower-outer quadrant of left female breast: Secondary | ICD-10-CM

## 2018-12-09 DIAGNOSIS — E785 Hyperlipidemia, unspecified: Secondary | ICD-10-CM | POA: Diagnosis present

## 2018-12-09 DIAGNOSIS — F419 Anxiety disorder, unspecified: Secondary | ICD-10-CM | POA: Diagnosis present

## 2018-12-09 DIAGNOSIS — F988 Other specified behavioral and emotional disorders with onset usually occurring in childhood and adolescence: Secondary | ICD-10-CM | POA: Diagnosis present

## 2018-12-09 DIAGNOSIS — M84550A Pathological fracture in neoplastic disease, pelvis, initial encounter for fracture: Secondary | ICD-10-CM | POA: Diagnosis present

## 2018-12-09 DIAGNOSIS — Z7901 Long term (current) use of anticoagulants: Secondary | ICD-10-CM

## 2018-12-09 DIAGNOSIS — C50919 Malignant neoplasm of unspecified site of unspecified female breast: Secondary | ICD-10-CM | POA: Diagnosis present

## 2018-12-09 DIAGNOSIS — Z86718 Personal history of other venous thrombosis and embolism: Secondary | ICD-10-CM

## 2018-12-09 DIAGNOSIS — Z806 Family history of leukemia: Secondary | ICD-10-CM

## 2018-12-09 DIAGNOSIS — S72009A Fracture of unspecified part of neck of unspecified femur, initial encounter for closed fracture: Secondary | ICD-10-CM

## 2018-12-09 DIAGNOSIS — G893 Neoplasm related pain (acute) (chronic): Secondary | ICD-10-CM | POA: Diagnosis present

## 2018-12-09 DIAGNOSIS — Z923 Personal history of irradiation: Secondary | ICD-10-CM

## 2018-12-09 DIAGNOSIS — M542 Cervicalgia: Secondary | ICD-10-CM | POA: Diagnosis not present

## 2018-12-09 DIAGNOSIS — Z006 Encounter for examination for normal comparison and control in clinical research program: Secondary | ICD-10-CM

## 2018-12-09 DIAGNOSIS — G4700 Insomnia, unspecified: Secondary | ICD-10-CM | POA: Diagnosis present

## 2018-12-09 DIAGNOSIS — Z9013 Acquired absence of bilateral breasts and nipples: Secondary | ICD-10-CM

## 2018-12-09 DIAGNOSIS — E039 Hypothyroidism, unspecified: Secondary | ICD-10-CM | POA: Diagnosis present

## 2018-12-09 DIAGNOSIS — T451X5A Adverse effect of antineoplastic and immunosuppressive drugs, initial encounter: Secondary | ICD-10-CM | POA: Diagnosis present

## 2018-12-09 DIAGNOSIS — Z825 Family history of asthma and other chronic lower respiratory diseases: Secondary | ICD-10-CM

## 2018-12-09 DIAGNOSIS — E7800 Pure hypercholesterolemia, unspecified: Secondary | ICD-10-CM | POA: Diagnosis present

## 2018-12-09 DIAGNOSIS — C7951 Secondary malignant neoplasm of bone: Principal | ICD-10-CM | POA: Diagnosis present

## 2018-12-09 DIAGNOSIS — Z95828 Presence of other vascular implants and grafts: Secondary | ICD-10-CM

## 2018-12-09 DIAGNOSIS — K5900 Constipation, unspecified: Secondary | ICD-10-CM | POA: Diagnosis present

## 2018-12-09 DIAGNOSIS — Z7189 Other specified counseling: Secondary | ICD-10-CM

## 2018-12-09 DIAGNOSIS — D6481 Anemia due to antineoplastic chemotherapy: Secondary | ICD-10-CM | POA: Diagnosis present

## 2018-12-09 DIAGNOSIS — Z20828 Contact with and (suspected) exposure to other viral communicable diseases: Secondary | ICD-10-CM | POA: Diagnosis present

## 2018-12-09 DIAGNOSIS — I871 Compression of vein: Secondary | ICD-10-CM | POA: Diagnosis present

## 2018-12-09 DIAGNOSIS — F329 Major depressive disorder, single episode, unspecified: Secondary | ICD-10-CM | POA: Diagnosis present

## 2018-12-09 DIAGNOSIS — F32A Depression, unspecified: Secondary | ICD-10-CM | POA: Diagnosis present

## 2018-12-09 DIAGNOSIS — Z79899 Other long term (current) drug therapy: Secondary | ICD-10-CM

## 2018-12-09 DIAGNOSIS — Z79890 Hormone replacement therapy (postmenopausal): Secondary | ICD-10-CM

## 2018-12-09 DIAGNOSIS — C7800 Secondary malignant neoplasm of unspecified lung: Secondary | ICD-10-CM | POA: Diagnosis present

## 2018-12-09 DIAGNOSIS — S14113A Complete lesion at C3 level of cervical spinal cord, initial encounter: Secondary | ICD-10-CM | POA: Diagnosis present

## 2018-12-09 DIAGNOSIS — Z853 Personal history of malignant neoplasm of breast: Secondary | ICD-10-CM

## 2018-12-09 DIAGNOSIS — Z171 Estrogen receptor negative status [ER-]: Secondary | ICD-10-CM

## 2018-12-09 DIAGNOSIS — D630 Anemia in neoplastic disease: Secondary | ICD-10-CM | POA: Diagnosis present

## 2018-12-09 DIAGNOSIS — Z888 Allergy status to other drugs, medicaments and biological substances status: Secondary | ICD-10-CM

## 2018-12-09 DIAGNOSIS — Z8249 Family history of ischemic heart disease and other diseases of the circulatory system: Secondary | ICD-10-CM

## 2018-12-09 DIAGNOSIS — Z9882 Breast implant status: Secondary | ICD-10-CM

## 2018-12-09 DIAGNOSIS — E876 Hypokalemia: Secondary | ICD-10-CM | POA: Diagnosis present

## 2018-12-09 LAB — CBC WITH DIFFERENTIAL/PLATELET
Abs Immature Granulocytes: 0.12 K/uL — ABNORMAL HIGH (ref 0.00–0.07)
Basophils Absolute: 0 K/uL (ref 0.0–0.1)
Basophils Relative: 0 %
Eosinophils Absolute: 0 K/uL (ref 0.0–0.5)
Eosinophils Relative: 0 %
HCT: 32.9 % — ABNORMAL LOW (ref 36.0–46.0)
Hemoglobin: 10.9 g/dL — ABNORMAL LOW (ref 12.0–15.0)
Immature Granulocytes: 1 %
Lymphocytes Relative: 6 %
Lymphs Abs: 0.6 K/uL — ABNORMAL LOW (ref 0.7–4.0)
MCH: 35.2 pg — ABNORMAL HIGH (ref 26.0–34.0)
MCHC: 33.1 g/dL (ref 30.0–36.0)
MCV: 106.1 fL — ABNORMAL HIGH (ref 80.0–100.0)
Monocytes Absolute: 0.5 K/uL (ref 0.1–1.0)
Monocytes Relative: 5 %
Neutro Abs: 8.9 K/uL — ABNORMAL HIGH (ref 1.7–7.7)
Neutrophils Relative %: 88 %
Platelets: 232 K/uL (ref 150–400)
RBC: 3.1 MIL/uL — ABNORMAL LOW (ref 3.87–5.11)
RDW: 19.5 % — ABNORMAL HIGH (ref 11.5–15.5)
WBC: 10.1 K/uL (ref 4.0–10.5)
nRBC: 0 % (ref 0.0–0.2)

## 2018-12-09 LAB — COMPREHENSIVE METABOLIC PANEL
ALT: 54 U/L — ABNORMAL HIGH (ref 0–44)
AST: 31 U/L (ref 15–41)
Albumin: 3.4 g/dL — ABNORMAL LOW (ref 3.5–5.0)
Alkaline Phosphatase: 86 U/L (ref 38–126)
Anion gap: 10 (ref 5–15)
BUN: 16 mg/dL (ref 6–20)
CO2: 29 mmol/L (ref 22–32)
Calcium: 9.3 mg/dL (ref 8.9–10.3)
Chloride: 96 mmol/L — ABNORMAL LOW (ref 98–111)
Creatinine, Ser: 0.74 mg/dL (ref 0.44–1.00)
GFR calc Af Amer: >60 mL/min (ref >60)
GFR calc non Af Amer: >60 mL/min (ref >60)
Glucose, Bld: 119 mg/dL — ABNORMAL HIGH (ref 70–99)
Potassium: 4.2 mmol/L (ref 3.5–5.1)
Sodium: 135 mmol/L (ref 135–145)
Total Bilirubin: 0.9 mg/dL (ref 0.3–1.2)
Total Protein: 7 g/dL (ref 6.5–8.1)

## 2018-12-09 MED ORDER — HYDROMORPHONE HCL 1 MG/ML IJ SOLN
2.0000 mg | Freq: Once | INTRAMUSCULAR | Status: AC
  Administered 2018-12-09: 2 mg via INTRAVENOUS
  Filled 2018-12-09: qty 2

## 2018-12-09 MED ORDER — IOHEXOL 300 MG/ML  SOLN
100.0000 mL | Freq: Once | INTRAMUSCULAR | Status: AC | PRN
  Administered 2018-12-09: 100 mL via INTRAVENOUS

## 2018-12-09 MED ORDER — SPIRONOLACTONE 100 MG PO TABS: 100.0000 mg | ORAL_TABLET | Freq: Every day | ORAL | Status: DC

## 2018-12-09 MED ORDER — MIRTAZAPINE 15 MG PO TABS
15.0000 mg | ORAL_TABLET | Freq: Every day | ORAL | Status: DC
  Administered 2018-12-09 – 2018-12-13 (×5): 15 mg via ORAL
  Filled 2018-12-09 (×5): qty 1

## 2018-12-09 MED ORDER — ACETAMINOPHEN 500 MG PO TABS
1000.0000 mg | ORAL_TABLET | Freq: Three times a day (TID) | ORAL | Status: DC
  Administered 2018-12-09 – 2018-12-14 (×15): 1000 mg via ORAL
  Filled 2018-12-09 (×15): qty 2

## 2018-12-09 MED ORDER — HYDROMORPHONE HCL 1 MG/ML IJ SOLN
1.0000 mg | INTRAMUSCULAR | Status: DC | PRN
  Administered 2018-12-09 – 2018-12-10 (×3): 1 mg via INTRAVENOUS
  Filled 2018-12-09 (×3): qty 1

## 2018-12-09 MED ORDER — ONDANSETRON HCL 8 MG PO TABS: 8.0000 mg | ORAL_TABLET | Freq: Two times a day (BID) | ORAL | Status: DC | PRN

## 2018-12-09 MED ORDER — GABAPENTIN 300 MG PO CAPS
300.0000 mg | ORAL_CAPSULE | Freq: Every evening | ORAL | Status: DC
  Administered 2018-12-09 – 2018-12-13 (×5): 300 mg via ORAL
  Filled 2018-12-09 (×5): qty 1

## 2018-12-09 MED ORDER — FLUOXETINE HCL 20 MG PO CAPS
40.0000 mg | ORAL_CAPSULE | Freq: Every day | ORAL | Status: DC
  Administered 2018-12-10 – 2018-12-14 (×5): 40 mg via ORAL
  Filled 2018-12-09 (×5): qty 2

## 2018-12-09 MED ORDER — CYCLOBENZAPRINE HCL 10 MG PO TABS
5.0000 mg | ORAL_TABLET | Freq: Three times a day (TID) | ORAL | Status: DC
  Administered 2018-12-09: 5 mg via ORAL
  Filled 2018-12-09 (×2): qty 1

## 2018-12-09 MED ORDER — LISDEXAMFETAMINE DIMESYLATE 30 MG PO CAPS
30.0000 mg | ORAL_CAPSULE | Freq: Every day | ORAL | Status: DC
  Administered 2018-12-10 – 2018-12-14 (×5): 30 mg via ORAL
  Filled 2018-12-09 (×6): qty 1

## 2018-12-09 MED ORDER — PROCHLORPERAZINE MALEATE 10 MG PO TABS
10.0000 mg | ORAL_TABLET | Freq: Four times a day (QID) | ORAL | Status: DC | PRN
  Filled 2018-12-09 (×2): qty 1

## 2018-12-09 MED ORDER — MORPHINE SULFATE ER 30 MG PO TBCR
30.0000 mg | EXTENDED_RELEASE_TABLET | Freq: Two times a day (BID) | ORAL | Status: DC
  Administered 2018-12-09 – 2018-12-14 (×10): 30 mg via ORAL
  Filled 2018-12-09: qty 2
  Filled 2018-12-09 (×7): qty 1
  Filled 2018-12-09: qty 2
  Filled 2018-12-09: qty 1

## 2018-12-09 MED ORDER — LEVOTHYROXINE SODIUM 88 MCG PO TABS
88.0000 ug | ORAL_TABLET | Freq: Every day | ORAL | Status: DC
  Administered 2018-12-10 – 2018-12-14 (×5): 88 ug via ORAL
  Filled 2018-12-09 (×5): qty 1

## 2018-12-09 MED ORDER — OXYCODONE HCL 5 MG PO TABS
10.0000 mg | ORAL_TABLET | Freq: Four times a day (QID) | ORAL | Status: DC | PRN
  Administered 2018-12-10: 10 mg via ORAL
  Filled 2018-12-09: qty 2

## 2018-12-09 MED ORDER — APIXABAN 5 MG PO TABS
5.0000 mg | ORAL_TABLET | Freq: Two times a day (BID) | ORAL | Status: DC
  Administered 2018-12-09 – 2018-12-14 (×10): 5 mg via ORAL
  Filled 2018-12-09 (×11): qty 1

## 2018-12-09 NOTE — ED Provider Notes (Signed)
[facility] EMERGENCY DEPARTMENT Provider Note   CSN: [id number] Arrival date & time: 12/09/18  1548     History   Chief Complaint No chief complaint on file.   HPI Laura Anthony is a 43 y.o. female.     HPI Patient presents with neck pain.  Has known stage IV breast cancer.  Developed neck pain yesterday.  No injury no fall.  No numbness or weakness.  Has increasing swelling of her face 2.  Unable to turn her head much.  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  Has a known DVT in the right arm and is on Eliquis for it.  No numbness or weakness.  States it does feel more difficult to swallow.  Had bone scan and CTs done around 6 days ago at [facility].  Did not show any neck pathology at that time.  States that they ultrasounded her arm but were unable to look at her neck due to the port.  On 30 of MS Contin twice a day and 10 mg of oxycodone for breakthrough pain.  States this is not really touched her pain.  Patient states that her phone was unable to recognize her face today.  Is planning on coming back to the [facility] system from [facility] for neck stages of treatment since she is not going to be on study. Past Medical History:  Diagnosis Date   ADD (attention deficit disorder)    Anemia    Anxiety    Breast cancer, left breast ([place])    S/P mastectomy 03/27/2017   DVT (deep venous thrombosis) ([place]) 2017   calf left - probably due to [facility] pills-took eliquis x3 mos, nonthing now   High cholesterol    Impingement syndrome of right shoulder 07/2013   Migraine    "usually 1/month" (03/28/2017)   PONV (postoperative nausea and vomiting)    Right bicipital tenosynovitis 07/2013   Rotator cuff impingement syndrome of right shoulder 07/12/2013   Seizures ([place])    x 1 as a child - was never on anticonvulsants (03/28/2017)    Patient Active Problem List   Diagnosis Date Noted   Neck  pain 12/09/2018   Goals of care, counseling/discussion 12/06/2018   Bone metastases ([place]) 08/07/2018   Pain from bone metastases ([place]) 08/07/2018   Family history of breast cancer 04/02/2018   Family history of prostate cancer 04/02/2018   History of therapeutic radiation 10/16/2017   Acquired absence of both breasts 04/03/2017   Breast cancer, stage 2, left ([place]) 03/27/2017   Port-A-Cath in place 12/16/2016   Genetic testing 12/06/2016   Encounter for antineoplastic chemotherapy 11/25/2016   Malignant neoplasm of lower-outer quadrant of left breast of female, estrogen receptor negative ([place]) 10/27/2016   Breast lump on left side at 1 o'clock position 05/22/2015   Migraine without aura or status migrainosus 05/29/2014   Hyperlipidemia LDL goal <100 10/22/2013   Depression 09/09/2013   Rotator cuff impingement syndrome of left shoulder 07/12/2013   ADD (attention deficit disorder) 01/09/2013   Anxiety 12/13/2012   Insomnia 12/13/2012    Past Surgical History:  Procedure Laterality Date   ADENOIDECTOMY  1981   ANKLE ARTHROSCOPY Right    BREAST BIOPSY Left 10/2016   KNEE ARTHROSCOPY Right    KNEE ARTHROSCOPY W/ ACL RECONSTRUCTION Left    LIPOSUCTION WITH LIPOFILLING Bilateral 01/05/2018   Procedure: LIPOFILLING FROM ABDOMEN TO BILATERAL CHEST;  Surgeon: Irene Limbo, MD;  Location: [facility];  Service: Plastics;  Laterality: Bilateral;   MASTECTOMY Left 03/27/2017   NIPPLE SPARING MASTECTOMY WITH RADIOACTIVE SEED TARGETED LYMPH NODE EXCISION AND LEFT AXILLARY SENTINEL LYMPH NODE BIOPSY   MASTECTOMY Right 03/27/2017   RIGHT PROPHYLACTIC NIPPLE SPARING MASTECTOMY   NIPPLE SPARING MASTECTOMY Right 03/27/2017   Procedure: RIGHT PROPHYLACTIC NIPPLE SPARING MASTECTOMY;  Surgeon: Rolm Bookbinder, MD;  Location: [place];  Service: General;  Laterality: Right;   PORT-A-CATH REMOVAL Right 01/05/2018   Procedure: REMOVAL  RIGHT CHEST PORT;  Surgeon: Irene Limbo, MD;  Location: [place];  Service: Plastics;  Laterality: Right;   PORTACATH PLACEMENT N/A 11/01/2016   Procedure: INSERTION PORT-A-CATH WITH [place];  Surgeon: Rolm Bookbinder, MD;  Location: [place];  Service: General;  Laterality: N/A;   PORTACATH PLACEMENT N/A 08/23/2018   Procedure: INSERTION PORT-A-CATH WITH ULTRASOUND;  Surgeon: Rolm Bookbinder, MD;  Location: [place];  Service: General;  Laterality: N/A;   RADIOACTIVE SEED GUIDED AXILLARY SENTINEL LYMPH NODE Left 03/27/2017   Procedure: LEFT NIPPLE SPARING MASTECTOMY WITH RADIOACTIVE SEED TARGETED LYMPH NODE EXCISION AND LEFT AXILLARY SENTINEL LYMPH NODE BIOPSY;  Surgeon: Rolm Bookbinder, MD;  Location: [place];  Service: General;  Laterality: Left;  REQUESTS RNFA   RECONSTRUCTION BREAST IMMEDIATE / DELAYED W/ TISSUE EXPANDER Bilateral 03/27/2017   BILATERAL BREAST RECONSTRUCTION WITH PLACEMENT OF TISSUE EXPANDER AND ALLODERM   REMOVAL OF BILATERAL TISSUE EXPANDERS WITH PLACEMENT OF BILATERAL BREAST IMPLANTS Bilateral 01/05/2018   Procedure: REMOVAL OF BILATERAL TISSUE EXPANDERS WITH PLACEMENT OF BILATERAL BREAST IMPLANTS;  Surgeon: Irene Limbo, MD;  Location: [place];  Service: Plastics;  Laterality: Bilateral;   SEPTOPLASTY WITH ETHMOIDECTOMY, AND MAXILLARY ANTROSTOMY  10/29/2010   bilat. max. antrostomy with left max. stripping; left ant. ethmoidectomy; right total ethmoidectomy; sphenoidotomy   SHOULDER ARTHROSCOPY WITH SUBACROMIAL DECOMPRESSION AND BICEP TENDON REPAIR Right 07/12/2013   Procedure: RIGHT SHOULDER ARTHROSCOPY DEBRIDEMENT EXTENSIVE DECOMPRESSION SUBACROMIAL PARTIAL ACROMIOPLASTY;  Surgeon: Johnny Bridge, MD;  Location: [facility];  Service: Orthopedics;  Laterality: Right;   WRIST ARTHROSCOPY  01/17/2012   Procedure: ARTHROSCOPY WRIST; right wrist Surgeon: Tennis Must, MD;   Location: [place];  Service: Orthopedics;  Laterality: Right;  RIGHT WRIST ARTHROSCOPY WITH TRIANGULAR FIBROCARTILAGE COMPLEX REPAIR AND DEBRIDEMENT      OB History   No obstetric history on file.      Home Medications    Prior to Admission medications   Medication Sig Start Date End Date Taking? Authorizing Provider  acetaminophen (TYLENOL) 500 MG tablet Take 1,000 mg by mouth every 8 (eight) hours.    Yes [provider]  ELIQUIS 5 MG TABS tablet Take 5 mg by mouth 2 (two) times daily. 11/21/18  Yes [provider]  FLUoxetine (PROZAC) 40 MG capsule Take 40 mg by mouth every morning.    Yes [provider]  gabapentin (NEURONTIN) 300 MG capsule Take 300 mg by mouth every evening.  11/13/18  Yes [provider]  levothyroxine (SYNTHROID) 88 MCG tablet Take 1 tablet (88 mcg total) by mouth daily before breakfast. 12/06/18  Yes Nicholas Lose, MD  lidocaine-prilocaine (EMLA) cream APPLY 1 APPLICATION TOPICALLY AS NEEDED. Patient taking differently: Apply 1 application topically as needed (for pain).  12/06/18  Yes Nicholas Lose, MD  lisdexamfetamine (VYVANSE) 30 MG capsule Take 30 mg by mouth every morning.    Yes [provider]  mirtazapine (REMERON) 15 MG tablet Take 15 mg by mouth at bedtime.  11/26/18 11/26/19 Yes [provider]  morphine (MS CONTIN) 30 MG 12 hr tablet Take 30 mg by mouth every 12 (twelve) hours.  12/07/18  Yes [provider]  ondansetron (ZOFRAN) 8 MG tablet Take 1 tablet (8 mg total) by mouth 2 (two) times daily as needed (Nausea or vomiting). 12/06/18  Yes Nicholas Lose, MD  Oxycodone HCl 10 MG TABS Take 1 tablet (10 mg total) by mouth every 6 (six) hours as needed. Patient taking differently: Take 10 mg by mouth every 6 (six) hours as needed (for breakthrough pain).  12/06/18  Yes Nicholas Lose, MD  predniSONE (DELTASONE) 10 MG tablet Take 6 tabs po daily x 5 days, 5 tabs po x 2 days, 4  tabs po x 2 days, 3 tabs po x 2 days, 2 tabs po x 2 days, 1 tab po x 2 days, then dc Patient taking differently: Take 20-70 mg by mouth See admin instructions. Take 70 mg by mouth once a day for 7 days, 60 mg once a day for 7 days, 50 mg once a day for 7 days, 40 mg once a day for 7 days, 30 mg once a day for 7 days, 20 mg once a day for 7 days, then to be re-evaluated 08/07/18  Yes Hayden Pedro, PA-C  prochlorperazine (COMPAZINE) 10 MG tablet Take 1 tablet (10 mg total) by mouth every 6 (six) hours as needed (Nausea or vomiting). 12/06/18  Yes Nicholas Lose, MD  rizatriptan (MAXALT) 10 MG tablet Take 10 mg by mouth daily as needed for migraine (and may repeat once in 2 hours, if no relief).    Yes [provider]  spironolactone (ALDACTONE) 100 MG tablet Take 100 mg by mouth daily.    Yes [provider]  cyclobenzaprine (FLEXERIL) 5 MG tablet Take 5 mg by mouth 3 (three) times daily. 09/13/18   [provider]  lidocaine-prilocaine (EMLA) cream Apply to affected area once Patient not taking: Reported on 12/09/2018 12/06/18   Nicholas Lose, MD  LORazepam (ATIVAN) 1 MG tablet TAKE 1 TABLET BY MOUTH AS NEEDED Patient not taking: Reported on 12/09/2018 08/24/18   Nicholas Lose, MD    Family History Family History  Problem Relation Age of Onset   Breast cancer Mother 61       triple negative   Leukemia Father    Lung cancer Father    Heart attack Maternal Uncle    Prostate cancer Paternal Uncle    COPD Paternal Grandmother    Heart disease Paternal Grandfather    Prostate cancer Paternal Uncle    Leukemia Cousin     Social History Social History   Tobacco Use   Smoking status: Never Smoker   Smokeless tobacco: Never Used  Substance Use Topics   Alcohol use: Yes    Comment: Drinks very rare   Drug use: No     Allergies   Sumatriptan, Statins, and Tape   Review of Systems Review of Systems  Constitutional: Negative for appetite  change.  HENT: Positive for trouble swallowing.   Respiratory: Negative for shortness of breath.   Gastrointestinal: Negative for abdominal pain.  Genitourinary: Negative for flank pain.  Musculoskeletal: Positive for neck pain.  Skin: Negative for wound.  Neurological: Negative for weakness.  Psychiatric/Behavioral: Negative for confusion.     Physical Exam Updated Vital Signs BP 129/83    Pulse 85    Resp  20    SpO2 95%   Physical Exam Vitals signs and nursing note reviewed.  HENT:     Head: Normocephalic.  Neck:     Comments: Decreased range of motion in neck.  No masses felt.  Has swelling of her face. Cardiovascular:     Rate and Rhythm: Regular rhythm.  Pulmonary:     Breath sounds: No wheezing or rhonchi.  Abdominal:     Tenderness: There is no abdominal tenderness.  Musculoskeletal:     Right lower leg: No edema.     Left lower leg: No edema.  Skin:    General: Skin is warm.     Capillary Refill: Capillary refill takes less than 2 seconds.  Neurological:     Mental Status: She is alert and oriented to person, place, and time. Mental status is at baseline.      ED Treatments / Results  Labs (all labs ordered are listed, but only abnormal results are displayed) Labs Reviewed  CBC WITH DIFFERENTIAL/PLATELET - Abnormal; Notable for the following components:      Result Value   RBC 3.10 (*)    Hemoglobin 10.9 (*)    HCT 32.9 (*)    MCV 106.1 (*)    MCH 35.2 (*)    RDW 19.5 (*)    Neutro Abs 8.9 (*)    Lymphs Abs 0.6 (*)    Abs Immature Granulocytes 0.12 (*)    All other components within normal limits  COMPREHENSIVE METABOLIC PANEL - Abnormal; Notable for the following components:   Chloride 96 (*)    Glucose, Bld 119 (*)    Albumin 3.4 (*)    ALT 54 (*)    All other components within normal limits  SARS CORONAVIRUS 2 (TAT 6-24 HRS)    EKG None  Radiology Ct Soft Tissue Neck W Contrast  Result Date: 12/09/2018 CLINICAL DATA:  43 year old  female with metastatic breast cancer and increasing neck pain and swelling. Currently on chemotherapy. Query as feces syndrome. EXAM: CT NECK WITH CONTRAST TECHNIQUE: Multidetector CT imaging of the neck was performed using the standard protocol following the bolus administration of intravenous contrast. CONTRAST:  135mL OMNIPAQUE IOHEXOL 300 MG/ML SOLN in conjunction with contrast enhanced imaging of the chest reported separately. COMPARISON:  Chest CT today reported separately. Brain MRI 08/02/2018. FINDINGS: Pharynx and larynx: The glottis is closed. Laryngeal and pharyngeal soft tissue contours are within normal limits. Negative parapharyngeal spaces. Negative retropharyngeal space. Salivary glands: Negative sublingual space. Submandibular glands and parotid glands are within normal limits. Thyroid: Diminutive, negative. Lymph nodes: No cervical lymphadenopathy. Diminutive bilateral cervical nodes. Vascular: The bilateral internal jugular veins are patent. The left IJ is dominant but there is a right IJ approach porta cath in place. The visible bilateral subclavian veins are patent. 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 See also chest CT findings today reported separately. In the neck and at the skull base the major vascular structures are patent. Limited intracranial: Negative. Visualized orbits: Negative. Mastoids and visualized paranasal sinuses: Clear. Skeleton: Lytic metastasis in the C3 vertebral body is eccentric to the right and breaks through the posterior cortex (series 14, image 49 and sagittal image 48. No obvious epidural extension. There are patent epidural veins overlying the lesion. No other osseous metastasis identified in the neck. Upper chest: Reported separately today. IMPRESSION: 1. Patent major vascular structures in the neck and  at the skull base. 2. There is functional stenosis of the  right innominate vein related to the right IJ approach port catheter, but the visible SVC remains patent. The See CT Chest today reported separately. 3. Lytic C3 vertebral body metastasis which has broken through the posterior cortex, but no epidural tumor is evident by CT. 4. No other metastatic disease identified in the neck. Electronically Signed   By: Genevie Ann M.D.   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 CONTRAST:  174mL OMNIPAQUE IOHEXOL 300 MG/ML  SOLN COMPARISON:  PET-CT, 08/02/2018 FINDINGS: Cardiovascular: Interval placement of a right chest port catheter. There is new narrowing of the right brachiocephalic vein about the port catheter tubing (series 9, image 46) with new prominent small internal mammary and chest wall venous collaterals about the chest. Normal heart size. No pericardial effusion. Mediastinum/Nodes: No enlarged mediastinal, hilar, or axillary lymph nodes. Thyroid gland, trachea, and esophagus demonstrate no significant findings. Lungs/Pleura: There are numerous bilateral pulmonary nodules of varying sizes, a number of dominant nodule seen on initial staging examination dated 08/02/2018 not significantly changed in size, however with numerous new small pulmonary nodules throughout the lungs, for example a 5 mm nodule in the left lower lobe (series 11, image 86) and adjacent 2-3 mm nodules of the right lower lobe (series 11, image 113). No pleural effusion or pneumothorax. Upper Abdomen: No acute abnormality. Hepatic steatosis. Unchanged small left adrenal nodule, partially imaged. Musculoskeletal: Status post bilateral mastectomy and implant reconstruction. IMPRESSION: 1. Interval placement of a right chest port catheter. There is new narrowing of  the right brachiocephalic vein about the port catheter tubing (series 9, image 46) with new prominent small internal mammary and chest wall venous collaterals about the chest. 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 The superior vena cava itself is widely patent to the cavoatrial junction. A specifically tailored multiphasic CT central venogram may be helpful to further assess functional degree of stenosis and diagnostic/therapeutic interventional venogram can be considered. 2. There are numerous bilateral pulmonary nodules of varying sizes, a number of dominant nodule seen on initial staging examination dated 08/02/2018 not significantly changed in size, however with numerous new small pulmonary nodules throughout the lungs, for example a 5 mm nodule in the left lower lobe (series 11, image 86) and adjacent 2-3 mm nodules of the right lower lobe (series 11, image 113). Findings are consistent with worsened pulmonary metastatic disease. Electronically Signed   By: Eddie Candle M.D.   On: 12/09/2018 19:24    Procedures Procedures (including critical care time)  Medications Ordered in ED Medications  HYDROmorphone (DILAUDID) injection 2 mg (2 mg Intravenous Given 12/09/18 1725)  iohexol (OMNIPAQUE) 300 MG/ML solution 100 mL (100 mLs Intravenous Contrast Given 12/09/18 1832)  HYDROmorphone (DILAUDID) injection 2 mg (2 mg Intravenous Given 12/09/18 1938)     Initial Impression / Assessment and Plan / ED Course  I have reviewed the triage vital signs and the nursing notes.  Pertinent labs & imaging results that were available during my care of the patient were reviewed by me and considered in my medical decision making (see chart for details).        Patient with neck pain.  Facial swelling.  Has new metastatic lesion on C3 with some posterior  breakthrough.  This could be the cause of the pain. Also has brachiocephalic narrowing due to her port.  Will need further  evaluation.  Will admit for pain control and further consultation with neurosurgery and potentially interventional radiology or surgery.  Final Clinical Impressions(s) / ED Diagnoses   Final diagnoses:  Metastatic cancer to spine [facility])    ED Discharge Orders    None       Davonna Belling, MD 12/09/18 2049

## 2018-12-09 NOTE — Progress Notes (Signed)
NEUROSURGERY PROGRESS NOTE  Called in regards to this patients CT neck results. Patient has a history of breast cancer in which she is currently receiving chemotherapy for. Came in with neck pain. Was reported by edp that patient is neurologically intact. CT c spine shows a metastatic lesion to the C3 vertebral body with posterior cortex involvement. Would recommend aspen collar and mri c spine with and without contrast. Medicine admitting. Will plan for formal consult in the morning.   Temp:  [98.6 F (37 C)] 98.6 F (37 C) (11/08 1503) Pulse Rate:  [79-100] 81 (11/08 1945) Resp:  [11-22] 12 (11/08 1945) BP: (118-139)/(76-92) 133/89 (11/08 1945) SpO2:  [94 %-97 %] 94 % (11/08 1945)  Eleonore Chiquito, NP 12/09/2018 8:00 PM

## 2018-12-09 NOTE — ED Notes (Signed)
ED TO INPATIENT HANDOFF REPORT  ED Nurse Name and Phone #: Julita Ozbun   S Name/Age/Gender Laura Anthony 43 y.o. female Room/Bed: 042C/042C  Code Status   Code Status: Prior  Home/SNF/Other Home Patient oriented to: self, place, time and situation Is this baseline? Yes   Triage Complete: Triage complete  Chief Complaint neck pain  Triage Note Patient developed bilateral posterior neck pain yesterday. 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 Also has right groin pain. Takes po morphine 30 x 2 daily and no relief   Allergies Allergies  Allergen Reactions  . Sumatriptan Other (See Comments)    Numbness to face   . Statins Other (See Comments)    Leg pain  . Tape Rash and Other (See Comments)    Rash from dressing over port-a-cath     Level of Care/Admitting Diagnosis ED Disposition    ED Disposition Condition [place]: [place] [[id number]]  Level of Care: Med-Surg [16]  I expect the patient will be discharged within 24 hours: Yes  LOW acuity---Tx typically complete <24 hrs---ACUTE conditions typically can be evaluated <24 hours---LABS likely to return to acceptable levels <24 hours---IS near functional baseline---EXPECTED to return to current living arrangement---NOT newly hypoxic: Meets criteria for 5C-Observation unit  Covid Evaluation: Asymptomatic Screening Protocol (No Symptoms)  Diagnosis: Neck pain [id number]  Admitting Physician: Shela Leff [id number]  Attending Physician: Shela Leff [id number]  PT Class (Do Not Modify): Observation [104]  PT Acc Code (Do Not Modify): Observation [10022]       B Medical/Surgery History Past Medical History:  Diagnosis Date  . ADD (attention deficit disorder)   . Anemia   . Anxiety   . Breast cancer, left breast ([place])    S/P mastectomy 03/27/2017  . DVT (deep venous thrombosis) ([place]) 2017   calf left  - probably due to [facility] pills-took eliquis x3 mos, nonthing now  . High cholesterol   . Impingement syndrome of right shoulder 07/2013  . Migraine    "usually 1/month" (03/28/2017)  . PONV (postoperative nausea and vomiting)   . Right bicipital tenosynovitis 07/2013  . Rotator cuff impingement syndrome of right shoulder 07/12/2013  . Seizures ([place])    x 1 as a child - was never on anticonvulsants (03/28/2017)   Past Surgical History:  Procedure Laterality Date  . ADENOIDECTOMY  1981  . ANKLE ARTHROSCOPY Right   . BREAST BIOPSY Left 10/2016  . KNEE ARTHROSCOPY Right   . KNEE ARTHROSCOPY W/ ACL RECONSTRUCTION Left   . LIPOSUCTION WITH LIPOFILLING Bilateral 01/05/2018   Procedure: LIPOFILLING FROM ABDOMEN TO BILATERAL CHEST;  Surgeon: Irene Limbo, MD;  Location: [place];  Service: Plastics;  Laterality: Bilateral;  . MASTECTOMY Left 03/27/2017   NIPPLE SPARING MASTECTOMY WITH RADIOACTIVE SEED TARGETED LYMPH NODE EXCISION AND LEFT AXILLARY SENTINEL LYMPH NODE BIOPSY  . MASTECTOMY Right 03/27/2017   RIGHT PROPHYLACTIC NIPPLE SPARING MASTECTOMY  . NIPPLE SPARING MASTECTOMY Right 03/27/2017   Procedure: RIGHT PROPHYLACTIC NIPPLE SPARING MASTECTOMY;  Surgeon: Rolm Bookbinder, MD;  Location: [place];  Service: General;  Laterality: Right;  . PORT-A-CATH REMOVAL Right 01/05/2018   Procedure: REMOVAL RIGHT CHEST PORT;  Surgeon: Irene Limbo, MD;  Location: [place];  Service: Plastics;  Laterality: Right;  . PORTACATH PLACEMENT N/A 11/01/2016   Procedure: INSERTION PORT-A-CATH WITH [place];  Surgeon: Rolm Bookbinder, MD;  Location: [place];  Service: General;  Laterality:  N/A;  . PORTACATH PLACEMENT N/A 08/23/2018   Procedure: INSERTION PORT-A-CATH WITH ULTRASOUND;  Surgeon: Rolm Bookbinder, MD;  Location: [place];  Service: General;  Laterality: N/A;  . RADIOACTIVE SEED GUIDED AXILLARY SENTINEL LYMPH NODE Left 03/27/2017    Procedure: LEFT NIPPLE SPARING MASTECTOMY WITH RADIOACTIVE SEED TARGETED LYMPH NODE EXCISION AND LEFT AXILLARY SENTINEL LYMPH NODE BIOPSY;  Surgeon: Rolm Bookbinder, MD;  Location: [place];  Service: General;  Laterality: Left;  REQUESTS RNFA  . RECONSTRUCTION BREAST IMMEDIATE / DELAYED W/ TISSUE EXPANDER Bilateral 03/27/2017   BILATERAL BREAST RECONSTRUCTION WITH PLACEMENT OF TISSUE EXPANDER AND ALLODERM  . REMOVAL OF BILATERAL TISSUE EXPANDERS WITH PLACEMENT OF BILATERAL BREAST IMPLANTS Bilateral 01/05/2018   Procedure: REMOVAL OF BILATERAL TISSUE EXPANDERS WITH PLACEMENT OF BILATERAL BREAST IMPLANTS;  Surgeon: Irene Limbo, MD;  Location: [place];  Service: Plastics;  Laterality: Bilateral;  . SEPTOPLASTY WITH ETHMOIDECTOMY, AND MAXILLARY ANTROSTOMY  10/29/2010   bilat. max. antrostomy with left max. stripping; left ant. ethmoidectomy; right total ethmoidectomy; sphenoidotomy  . SHOULDER ARTHROSCOPY WITH SUBACROMIAL DECOMPRESSION AND BICEP TENDON REPAIR Right 07/12/2013   Procedure: RIGHT SHOULDER ARTHROSCOPY DEBRIDEMENT EXTENSIVE DECOMPRESSION SUBACROMIAL PARTIAL ACROMIOPLASTY;  Surgeon: Johnny Bridge, MD;  Location: [place];  Service: Orthopedics;  Laterality: Right;  . WRIST ARTHROSCOPY  01/17/2012   Procedure: ARTHROSCOPY WRIST; right wrist Surgeon: Tennis Must, MD;  Location: [place];  Service: Orthopedics;  Laterality: Right;  RIGHT WRIST ARTHROSCOPY WITH TRIANGULAR FIBROCARTILAGE COMPLEX REPAIR AND DEBRIDEMENT      A IV Location/Drains/Wounds Patient Lines/Drains/Airways Status   Active Line/Drains/Airways    Name:   Placement date:   Placement time:   Site:   Days:   Implanted Port 08/23/18 Right Other (Comment)   08/23/18    1019    Other (Comment)   108   Peripheral IV 03/27/17 Right Wrist   03/27/17    0651    Wrist   622   Peripheral IV 12/09/18 Right Antecubital   12/09/18    1715    Antecubital    less than 1   Closed System Drain 1 Right;Lateral Breast Bulb (JP) 15 Fr.   03/27/17    1032    Breast   622   Closed System Drain 2 Right;Medial Breast Bulb (JP) 19 Fr.   03/27/17    1033    Breast   622   Closed System Drain 3 Left;Medial Breast Bulb (JP) 15 Fr.   03/27/17    1114    Breast   622   Closed System Drain 4 Left;Lateral Bulb (JP) 19 Fr.   03/27/17    1115    -   622   Incision (Closed) 07/12/13 Shoulder Right   07/12/13    1355     1976   Incision (Closed) 11/01/16 Breast Right   11/01/16    0951     768   Incision (Closed) 03/27/17 Breast Right   03/27/17    1136     622   Incision (Closed) 03/27/17 Breast Left   03/27/17    1136     622   Incision (Closed) 03/27/17 Axilla Left   03/27/17    1201     622   Incision (Closed) 01/05/18 Breast Right   01/05/18    0844     338   Incision (Closed) 01/05/18 Breast Left   01/05/18    0844  338   Incision (Closed) 01/05/18 Abdomen Right   01/05/18    0844     338   Incision (Closed) 01/05/18 Abdomen Left   01/05/18    0844     338   Incision (Closed) 01/05/18 Chest Right   01/05/18    0800     338   Incision (Closed) 08/08/18 Chest Left   08/08/18    1211     123   Incision (Closed) 08/23/18 Chest Right   08/23/18    1028     108          Intake/Output Last 24 hours No intake or output data in the 24 hours ending 12/09/18 2106  Labs/Imaging Results for orders placed or performed during the hospital encounter of 12/09/18 (from the past 48 hour(s))  CBC with Differential     Status: Abnormal   Collection Time: 12/09/18  5:27 PM  Result Value Ref Range   WBC 10.1 4.0 - 10.5 K/uL   RBC 3.10 (L) 3.87 - 5.11 MIL/uL   Hemoglobin 10.9 (L) 12.0 - 15.0 g/dL   HCT 32.9 (L) 36.0 - 46.0 %   MCV 106.1 (H) 80.0 - 100.0 fL   MCH 35.2 (H) 26.0 - 34.0 pg   MCHC 33.1 30.0 - 36.0 g/dL   RDW 19.5 (H) 11.5 - 15.5 %   Platelets 232 150 - 400 K/uL   nRBC 0.0 0.0 - 0.2 %   Neutrophils Relative % 88 %   Neutro Abs 8.9 (H) 1.7 - 7.7 K/uL    Lymphocytes Relative 6 %   Lymphs Abs 0.6 (L) 0.7 - 4.0 K/uL   Monocytes Relative 5 %   Monocytes Absolute 0.5 0.1 - 1.0 K/uL   Eosinophils Relative 0 %   Eosinophils Absolute 0.0 0.0 - 0.5 K/uL   Basophils Relative 0 %   Basophils Absolute 0.0 0.0 - 0.1 K/uL   Immature Granulocytes 1 %   Abs Immature Granulocytes 0.12 (H) 0.00 - 0.07 K/uL    Comment: Performed at [facility], [address], [place], [place] [address]  Comprehensive metabolic panel     Status: Abnormal   Collection Time: 12/09/18  5:27 PM  Result Value Ref Range   Sodium 135 135 - 145 mmol/L   Potassium 4.2 3.5 - 5.1 mmol/L   Chloride 96 (L) 98 - 111 mmol/L   CO2 29 22 - 32 mmol/L   Glucose, Bld 119 (H) 70 - 99 mg/dL   BUN 16 6 - 20 mg/dL   Creatinine, Ser 0.74 0.44 - 1.00 mg/dL   Calcium 9.3 8.9 - 10.3 mg/dL   Total Protein 7.0 6.5 - 8.1 g/dL   Albumin 3.4 (L) 3.5 - 5.0 g/dL   AST 31 15 - 41 U/L   ALT 54 (H) 0 - 44 U/L   Alkaline Phosphatase 86 38 - 126 U/L   Total Bilirubin 0.9 0.3 - 1.2 mg/dL   GFR calc non Af Amer >60 >60 mL/min   GFR calc Af Amer >60 >60 mL/min   Anion gap 10 5 - 15    Comment: Performed at [facility] [address], [place], [place] [address]   Ct Soft Tissue Neck W Contrast  Result Date: 12/09/2018 CLINICAL DATA:  43 year old female with metastatic breast cancer and increasing neck pain and swelling. Currently on chemotherapy. Query as feces syndrome. EXAM: CT NECK WITH CONTRAST TECHNIQUE: Multidetector CT imaging of the neck was performed using the  standard protocol following the bolus administration of intravenous contrast. CONTRAST:  138mL OMNIPAQUE IOHEXOL 300 MG/ML SOLN in conjunction with contrast enhanced imaging of the chest reported separately. COMPARISON:  Chest CT today reported separately. Brain MRI 08/02/2018. FINDINGS: Pharynx and larynx: The glottis is closed. Laryngeal and pharyngeal soft tissue contours are within normal limits. Negative parapharyngeal  spaces. Negative retropharyngeal space. Salivary glands: Negative sublingual space. Submandibular glands and parotid glands are within normal limits. Thyroid: Diminutive, negative. Lymph nodes: No cervical lymphadenopathy. Diminutive bilateral cervical nodes. Vascular: The bilateral internal jugular veins are patent. The left IJ is dominant but there is a right IJ approach porta cath in place. The visible bilateral subclavian veins are patent. 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 See also chest CT findings today reported separately. In the neck and at the skull base the major vascular structures are patent. Limited intracranial: Negative. Visualized orbits: Negative. Mastoids and visualized paranasal sinuses: Clear. Skeleton: Lytic metastasis in the C3 vertebral body is eccentric to the right and breaks through the posterior cortex (series 14, image 49 and sagittal image 48. No obvious epidural extension. There are patent epidural veins overlying the lesion. No other osseous metastasis identified in the neck. Upper chest: Reported separately today. IMPRESSION: 1. Patent major vascular structures in the neck and at the skull base. 2. 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 The See CT Chest today reported separately. 3. Lytic C3 vertebral body metastasis which has broken through the posterior cortex, but no epidural tumor is evident by CT. 4. No other metastatic disease identified in the neck. Electronically Signed   By: Genevie Ann M.D.   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 CONTRAST:  115mL OMNIPAQUE IOHEXOL 300 MG/ML  SOLN COMPARISON:  PET-CT, 08/02/2018 FINDINGS: Cardiovascular: Interval placement of a right chest port catheter. There is new narrowing of the right brachiocephalic vein about the port catheter tubing (series 9, image 46) with new prominent small internal mammary and chest wall venous collaterals about the chest. Normal heart size. No pericardial effusion. Mediastinum/Nodes: No enlarged mediastinal, hilar, or axillary lymph nodes. Thyroid gland, trachea, and esophagus demonstrate no significant findings. Lungs/Pleura: There are numerous bilateral pulmonary nodules of varying sizes, a number of dominant nodule seen on initial staging examination dated 08/02/2018 not significantly changed in size, however with numerous new small pulmonary nodules throughout the lungs, for example a 5 mm nodule in the left lower lobe (series 11, image 86) and adjacent 2-3 mm nodules of the right lower lobe (series 11, image 113). No pleural effusion or pneumothorax. Upper Abdomen: No acute abnormality. Hepatic steatosis. Unchanged small left adrenal nodule, partially imaged. Musculoskeletal: Status post bilateral mastectomy and implant reconstruction. IMPRESSION: 1. Interval placement of a right chest port catheter. There is new narrowing of the right brachiocephalic vein about the port catheter tubing (series 9, image 46) with new prominent small internal mammary and chest wall venous collaterals about the chest. 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 The superior vena cava itself is widely patent to the cavoatrial junction. A specifically tailored multiphasic CT central  venogram may be helpful to further assess functional degree of stenosis and diagnostic/therapeutic interventional venogram can be considered. 2. There are numerous bilateral pulmonary nodules of varying sizes, a number of dominant nodule seen on initial staging examination  dated 08/02/2018 not significantly changed in size, however with numerous new small pulmonary nodules throughout the lungs, for example a 5 mm nodule in the left lower lobe (series 11, image 86) and adjacent 2-3 mm nodules of the right lower lobe (series 11, image 113). Findings are consistent with worsened pulmonary metastatic disease. Electronically Signed   By: Eddie Candle M.D.   On: 12/09/2018 19:24    Pending Labs Unresulted Labs (From admission, onward)    Start     Ordered   12/09/18 1938  SARS CORONAVIRUS 2 (TAT 6-24 HRS) Nasopharyngeal Nasopharyngeal Swab  (Asymptomatic/Tier 2 Patients Labs)  Once,   STAT    Question Answer Comment  Is this test for diagnosis or screening Screening   Symptomatic for COVID-19 as defined by CDC No   Hospitalized for COVID-19 No   Admitted to ICU for COVID-19 No   Previously tested for COVID-19 Yes   Resident in a congregate (group) care setting No   Employed in healthcare setting No   Pregnant No      12/09/18 1937          Vitals/Pain Today's Vitals   12/09/18 2015 12/09/18 2030 12/09/18 2045 12/09/18 2100  BP:  129/83 120/78 135/74  Pulse:  85 92 89  Resp:  20 (!) 22 15  SpO2:  95% 95% 95%  PainSc: 4        Isolation Precautions No active isolations  Medications Medications  HYDROmorphone (DILAUDID) injection 2 mg (2 mg Intravenous Given 12/09/18 1725)  iohexol (OMNIPAQUE) 300 MG/ML solution 100 mL (100 mLs Intravenous Contrast Given 12/09/18 1832)  HYDROmorphone (DILAUDID) injection 2 mg (2 mg Intravenous Given 12/09/18 1938)    Mobility walks Low fall risk   Focused Assessments    R Recommendations: See Admitting Provider Note  Report given to:   Additional Notes:

## 2018-12-09 NOTE — Discharge Instructions (Signed)
I have spoken to Dr. Marin Olp and oncology.  With patient's severe pain and facial swelling, he feels like she might have superior vena cava syndrome.  He recommends transferring patient to the emergency department for scanning.

## 2018-12-09 NOTE — ED Provider Notes (Signed)
[place]    CSN: [id number] Arrival date & time: 12/09/18  1440      History   Chief Complaint Chief Complaint  Patient presents with  . Neck Pain    HPI Laura Anthony is a 43 y.o. female.   HPI  chart is reviewed Not all notes are available as she has oncology care at [facility] where she has been receiving clinical trial treatment.  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 Had a CT scan of chest, and bone scan on Monday 11/2.  Patient is aware of results but does not think there was any neck involvement. She has been having thoracic back pain that is controlled with acetaminophen, oxycodone and MS contin Yesterday during the day developed increasingly severe neck pain.  No injury.  No fall.  No prior neck problems.  She states that when she laid down she could not even get up to get out of bed without assistance.  Today the pain is worse.  She is also having a vague sensation of swelling in her throat when she swallows.  She is able to swallow food fluids and medicine.  She also has swelling in her face.  She states that this became apparent to her when she tried to use face recognition on her telephone and computer, it does not recognize her. She is on Eliquis twice a day  Past Medical History:  Diagnosis Date  . ADD (attention deficit disorder)   . Anemia   . Anxiety   . Breast cancer, left breast ([place])    S/P mastectomy 03/27/2017  . DVT (deep venous thrombosis) ([place]) 2017   calf left - probably due to [facility] pills-took eliquis x3 mos, nonthing now  . High cholesterol   . Impingement syndrome of right shoulder 07/2013  . Migraine    "usually 1/month" (03/28/2017)  . PONV (postoperative nausea and vomiting)   . Right bicipital tenosynovitis 07/2013  . Rotator cuff impingement syndrome of right shoulder 07/12/2013  . Seizures ([place])    x 1 as a child - was never on anticonvulsants (03/28/2017)    Patient Active Problem List   Diagnosis Date  Noted  . Goals of care, counseling/discussion 12/06/2018  . Bone metastases ([place]) 08/07/2018  . Pain from bone metastases ([place]) 08/07/2018  . Family history of breast cancer 04/02/2018  . Family history of prostate cancer 04/02/2018  . History of therapeutic radiation 10/16/2017  . Acquired absence of both breasts 04/03/2017  . Breast cancer, stage 2, left ([place]) 03/27/2017  . Port-A-Cath in place 12/16/2016  . Genetic testing 12/06/2016  . Encounter for antineoplastic chemotherapy 11/25/2016  . Malignant neoplasm of lower-outer quadrant of left breast of female, estrogen receptor negative ([place]) 10/27/2016  . Breast lump on left side at 1 o'clock position 05/22/2015  . Migraine without aura or status migrainosus 05/29/2014  . Hyperlipidemia LDL goal <100 10/22/2013  . Depression 09/09/2013  . Rotator cuff impingement syndrome of left shoulder 07/12/2013  . ADD (attention deficit disorder) 01/09/2013  . Anxiety 12/13/2012  . Insomnia 12/13/2012    Past Surgical History:  Procedure Laterality Date  . ADENOIDECTOMY  1981  . ANKLE ARTHROSCOPY Right   . BREAST BIOPSY Left 10/2016  . KNEE ARTHROSCOPY Right   . KNEE ARTHROSCOPY W/ ACL RECONSTRUCTION Left   . LIPOSUCTION WITH LIPOFILLING Bilateral 01/05/2018   Procedure: LIPOFILLING FROM ABDOMEN TO BILATERAL CHEST;  Surgeon: Irene Limbo, MD;  Location: [place];  Service: Clinical cytogeneticist;  Laterality: Bilateral;  . MASTECTOMY Left 03/27/2017   NIPPLE SPARING MASTECTOMY WITH RADIOACTIVE SEED TARGETED LYMPH NODE EXCISION AND LEFT AXILLARY SENTINEL LYMPH NODE BIOPSY  . MASTECTOMY Right 03/27/2017   RIGHT PROPHYLACTIC NIPPLE SPARING MASTECTOMY  . NIPPLE SPARING MASTECTOMY Right 03/27/2017   Procedure: RIGHT PROPHYLACTIC NIPPLE SPARING MASTECTOMY;  Surgeon: Rolm Bookbinder, MD;  Location: [place];  Service: General;  Laterality: Right;  . PORT-A-CATH REMOVAL Right 01/05/2018   Procedure: REMOVAL RIGHT CHEST  PORT;  Surgeon: Irene Limbo, MD;  Location: [place];  Service: Plastics;  Laterality: Right;  . PORTACATH PLACEMENT N/A 11/01/2016   Procedure: INSERTION PORT-A-CATH WITH [place];  Surgeon: Rolm Bookbinder, MD;  Location: [place];  Service: General;  Laterality: N/A;  . PORTACATH PLACEMENT N/A 08/23/2018   Procedure: INSERTION PORT-A-CATH WITH ULTRASOUND;  Surgeon: Rolm Bookbinder, MD;  Location: [place];  Service: General;  Laterality: N/A;  . RADIOACTIVE SEED GUIDED AXILLARY SENTINEL LYMPH NODE Left 03/27/2017   Procedure: LEFT NIPPLE SPARING MASTECTOMY WITH RADIOACTIVE SEED TARGETED LYMPH NODE EXCISION AND LEFT AXILLARY SENTINEL LYMPH NODE BIOPSY;  Surgeon: Rolm Bookbinder, MD;  Location: [place];  Service: General;  Laterality: Left;  REQUESTS RNFA  . RECONSTRUCTION BREAST IMMEDIATE / DELAYED W/ TISSUE EXPANDER Bilateral 03/27/2017   BILATERAL BREAST RECONSTRUCTION WITH PLACEMENT OF TISSUE EXPANDER AND ALLODERM  . REMOVAL OF BILATERAL TISSUE EXPANDERS WITH PLACEMENT OF BILATERAL BREAST IMPLANTS Bilateral 01/05/2018   Procedure: REMOVAL OF BILATERAL TISSUE EXPANDERS WITH PLACEMENT OF BILATERAL BREAST IMPLANTS;  Surgeon: Irene Limbo, MD;  Location: [place];  Service: Plastics;  Laterality: Bilateral;  . SEPTOPLASTY WITH ETHMOIDECTOMY, AND MAXILLARY ANTROSTOMY  10/29/2010   bilat. max. antrostomy with left max. stripping; left ant. ethmoidectomy; right total ethmoidectomy; sphenoidotomy  . SHOULDER ARTHROSCOPY WITH SUBACROMIAL DECOMPRESSION AND BICEP TENDON REPAIR Right 07/12/2013   Procedure: RIGHT SHOULDER ARTHROSCOPY DEBRIDEMENT EXTENSIVE DECOMPRESSION SUBACROMIAL PARTIAL ACROMIOPLASTY;  Surgeon: Johnny Bridge, MD;  Location: [place];  Service: Orthopedics;  Laterality: Right;  . WRIST ARTHROSCOPY  01/17/2012   Procedure: ARTHROSCOPY WRIST; right wrist Surgeon: Tennis Must, MD;  Location: [place];  Service: Orthopedics;  Laterality: Right;  RIGHT WRIST ARTHROSCOPY WITH TRIANGULAR FIBROCARTILAGE COMPLEX REPAIR AND DEBRIDEMENT     OB History   No obstetric history on file.      Home Medications    Prior to Admission medications   Medication Sig Start Date End Date Taking? Authorizing Provider  acetaminophen (TYLENOL) 500 MG tablet Take 1,000 mg by mouth every 8 (eight) hours as needed for moderate pain.   Yes [provider]  cyclobenzaprine (FLEXERIL) 5 MG tablet Take 5 mg by mouth 3 (three) times daily. 09/13/18  Yes [provider]  ELIQUIS 5 MG TABS tablet Take 5 mg by mouth 2 (two) times daily. 11/21/18  Yes [provider]  FLUoxetine (PROZAC) 40 MG capsule Take 40 mg by mouth every morning.    Yes [provider]  gabapentin (NEURONTIN) 300 MG capsule Take by mouth. 11/13/18  Yes [provider]  levothyroxine (SYNTHROID) 88 MCG tablet Take 1 tablet (88 mcg total) by mouth daily before breakfast. 12/06/18  Yes Nicholas Lose, MD  lidocaine-prilocaine (EMLA) cream APPLY 1 APPLICATION TOPICALLY AS NEEDED. 12/06/18  Yes Nicholas Lose, MD  lidocaine-prilocaine (EMLA) cream Apply to affected area once 12/06/18  Yes Nicholas Lose, MD  lisdexamfetamine (VYVANSE) 30 MG capsule Take 30 mg by  mouth daily.    Yes [provider]  mirtazapine (REMERON) 15 MG tablet Take by mouth. 11/26/18 11/26/19 Yes [provider]  morphine (MS CONTIN) 30 MG 12 hr tablet Take by mouth. 12/07/18  Yes [provider]  ondansetron (ZOFRAN) 8 MG tablet Take 1 tablet (8 mg total) by mouth 2 (two) times daily as needed (Nausea or vomiting). 12/06/18  Yes Nicholas Lose, MD  Oxycodone HCl 10 MG TABS Take 1 tablet (10 mg total) by mouth every 6 (six) hours as needed. 12/06/18  Yes Nicholas Lose, MD  predniSONE (DELTASONE) 10 MG tablet Take 6 tabs po daily x 5 days, 5 tabs po x 2 days, 4 tabs po x 2 days, 3 tabs po x 2 days, 2  tabs po x 2 days, 1 tab po x 2 days, then dc 08/07/18  Yes Hayden Pedro, PA-C  prochlorperazine (COMPAZINE) 10 MG tablet Take 1 tablet (10 mg total) by mouth every 6 (six) hours as needed (Nausea or vomiting). 12/06/18  Yes Nicholas Lose, MD  rizatriptan (MAXALT) 10 MG tablet Take 10 mg by mouth daily as needed for migraine. May repeat in 2 hours if needed    Yes [provider]  spironolactone (ALDACTONE) 100 MG tablet Take 100 mg by mouth daily.    Yes [provider]  LORazepam (ATIVAN) 1 MG tablet TAKE 1 TABLET BY MOUTH AS NEEDED 08/24/18   Nicholas Lose, MD    Family History Family History  Problem Relation Age of Onset  . Breast cancer Mother 39       triple negative  . Leukemia Father   . Lung cancer Father   . Heart attack Maternal Uncle   . Prostate cancer Paternal Uncle   . COPD Paternal Grandmother   . Heart disease Paternal Grandfather   . Prostate cancer Paternal Uncle   . Leukemia Cousin     Social History Social History   Tobacco Use  . Smoking status: Never Smoker  . Smokeless tobacco: Never Used  Substance Use Topics  . Alcohol use: Yes    Comment: Drinks very rare  . Drug use: No     Allergies   Sumatriptan, Adhesive [tape], and Statins   Review of Systems Review of Systems  Constitutional: Negative for chills and fever.  HENT: Positive for trouble swallowing. Negative for ear pain and sore throat.        Face swelling  Eyes: Negative for pain and visual disturbance.  Respiratory: Negative for cough and shortness of breath.   Cardiovascular: Negative for chest pain and palpitations.  Gastrointestinal: Negative for abdominal pain and vomiting.  Genitourinary: Negative for dysuria and hematuria.  Musculoskeletal: Positive for neck pain and neck stiffness. Negative for arthralgias and back pain.  Skin: Negative for color change and rash.  Neurological: Negative for seizures and syncope.  All other systems reviewed and are  negative.    Physical Exam Triage Vital Signs ED Triage Vitals  Enc Vitals Group     BP 12/09/18 1503 134/88     Pulse Rate 12/09/18 1503 86     Resp 12/09/18 1503 18     Temp 12/09/18 1503 98.6 F (37 C)     Temp Source 12/09/18 1503 Oral     SpO2 12/09/18 1503 96 %     Weight --      Height --      Head Circumference --      Peak Flow --  Pain Score 12/09/18 1458 7     Pain Loc --      Pain Edu? --      Excl. in [place]? --    No data found.  Updated Vital Signs BP 134/88 (BP Location: Right Arm)   Pulse 86   Temp 98.6 F (37 C) (Oral)   Resp 18   SpO2 96%     Physical Exam Constitutional:      General: She is not in acute distress.    Appearance: She is well-developed.     Comments: Appears uncomfortable.  Stiff and guarded movements  HENT:     Head: Normocephalic and atraumatic.     Right Ear: Tympanic membrane, ear canal and external ear normal.     Left Ear: Tympanic membrane, ear canal and external ear normal.     Nose: Nose normal. No congestion.     Mouth/Throat:     Mouth: Mucous membranes are moist.     Pharynx: No posterior oropharyngeal erythema.  Eyes:     Conjunctiva/sclera: Conjunctivae normal.     Pupils: Pupils are equal, round, and reactive to light.  Neck:     Musculoskeletal: No muscular tenderness.     Comments: Very limited range of motion.  I cannot elicit any tenderness to palpation of the bones, muscles, or neck structures. Cardiovascular:     Rate and Rhythm: Normal rate.  Pulmonary:     Effort: Pulmonary effort is normal. No respiratory distress.  Abdominal:     General: There is no distension.     Palpations: Abdomen is soft.  Musculoskeletal: Normal range of motion.  Skin:    General: Skin is warm and dry.  Neurological:     Mental Status: She is alert.  Psychiatric:        Mood and Affect: Mood normal.        Behavior: Behavior normal.      UC Treatments / Results  Labs (all labs ordered are listed, but only  abnormal results are displayed) Labs Reviewed - No data to display  EKG   Radiology No results found.  Procedures Procedures (including critical care time)  Medications Ordered in UC Medications - No data to display  Initial Impression / Assessment and Plan / UC Course  I have reviewed the triage vital signs and the nursing notes.  Pertinent labs & imaging results that were available during my care of the patient were reviewed by me and considered in my medical decision making (see chart for details).     I called the on-call physician for allergy.  I spoke with Dr. Marin Olp.  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  Called the emergency room to speak with the charge nurse, to make sure that she was not in the lobby.  She was roomed promptly. Final Clinical Impressions(s) / UC Diagnoses   Final diagnoses:  Neck pain  Breast cancer metastasized to bone, unspecified laterality [facility])     Discharge Instructions     I have spoken to Dr. Marin Olp and oncology.  With patient's severe pain and facial swelling, he feels like she might have superior vena cava syndrome.  He recommends transferring patient to the emergency department for scanning.   ED Prescriptions    None     PDMP not reviewed this encounter.   Raylene Everts, MD 12/09/18 [id number]

## 2018-12-09 NOTE — ED Triage Notes (Signed)
Pt has mete static breast cancer and is in a trial.  A month ago she was having a lot of musculoskeletal pain and they put her on steroids.  That seemed to get better, but then last night she was having so much neck pain she couldn't lift her head off her pillow.  Pt called her oncologist and they recommended she come here due to the severe pain and swelling in her face.

## 2018-12-09 NOTE — H&P (Signed)
History and Physical    Laura Anthony [id number] DOB: 09-21-1975 DOA: 12/09/2018  PCP: [name], MD Patient coming from: Home  Chief Complaint: Neck pain  HPI: Laura Anthony is a 43 y.o. female with medical history significant of metastatic breast cancer, provoked DVT of right upper extremity on Eliquis, hyperlipidemia, right shoulder rotator cuff impingement syndrome presenting with complaints of neck pain.  Patient reports 1 day history of severe right-sided neck pain.  She did not fall or sustain any injuries to her neck.  States today she noticed that her face appears a little red and swollen.  No weakness or paresthesias of her extremities.  No dyspnea or dysphagia.  States she has breast cancer with metastasis to several bones and was enrolled in a clinical trial at [facility].  Recent scans done at [facility] revealed that there was progression of disease.  She has an appointment with Dr. Sonny Dandy later this week.  ED Course: CT soft tissue neck showing lytic C3 vertebral body metastasis which is broken through the posterior cortex but no epidural tumor evident on CT.  CT chest showing new narrowing of the right brachiocephalic vein about the Port-A-Cath tubing with new prominent small internal mammary and chest wall venous collaterals about the chest.  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  SVC widely patent to the cavoatrial junction.  Review of Systems:  All systems reviewed and apart from history of presenting illness, are negative.  Past Medical History:  Diagnosis Date   ADD (attention deficit disorder)    Anemia    Anxiety    Breast cancer, left breast ([place])    S/P mastectomy 03/27/2017   DVT (deep venous thrombosis) ([place]) 2017   calf left - probably due to [facility] pills-took eliquis x3 mos, nonthing now   High cholesterol    Impingement syndrome of right shoulder 07/2013   Migraine    "usually 1/month"  (03/28/2017)   PONV (postoperative nausea and vomiting)    Right bicipital tenosynovitis 07/2013   Rotator cuff impingement syndrome of right shoulder 07/12/2013   Seizures ([place])    x 1 as a child - was never on anticonvulsants (03/28/2017)    Past Surgical History:  Procedure Laterality Date   ADENOIDECTOMY  1981   ANKLE ARTHROSCOPY Right    BREAST BIOPSY Left 10/2016   KNEE ARTHROSCOPY Right    KNEE ARTHROSCOPY W/ ACL RECONSTRUCTION Left    LIPOSUCTION WITH LIPOFILLING Bilateral 01/05/2018   Procedure: LIPOFILLING FROM ABDOMEN TO BILATERAL CHEST;  Surgeon: Irene Limbo, MD;  Location: [place];  Service: Plastics;  Laterality: Bilateral;   MASTECTOMY Left 03/27/2017   NIPPLE SPARING MASTECTOMY WITH RADIOACTIVE SEED TARGETED LYMPH NODE EXCISION AND LEFT AXILLARY SENTINEL LYMPH NODE BIOPSY   MASTECTOMY Right 03/27/2017   RIGHT PROPHYLACTIC NIPPLE SPARING MASTECTOMY   NIPPLE SPARING MASTECTOMY Right 03/27/2017   Procedure: RIGHT PROPHYLACTIC NIPPLE SPARING MASTECTOMY;  Surgeon: Rolm Bookbinder, MD;  Location: [place];  Service: General;  Laterality: Right;   PORT-A-CATH REMOVAL Right 01/05/2018   Procedure: REMOVAL RIGHT CHEST PORT;  Surgeon: Irene Limbo, MD;  Location: [place];  Service: Plastics;  Laterality: Right;   PORTACATH PLACEMENT N/A 11/01/2016   Procedure: INSERTION PORT-A-CATH WITH [place];  Surgeon: Rolm Bookbinder, MD;  Location: [place];  Service: General;  Laterality: N/A;   PORTACATH PLACEMENT N/A 08/23/2018   Procedure: INSERTION PORT-A-CATH WITH ULTRASOUND;  Surgeon: Rolm Bookbinder, MD;  Location: [facility];  Service: General;  Laterality: N/A;   RADIOACTIVE SEED GUIDED AXILLARY SENTINEL LYMPH NODE Left 03/27/2017   Procedure: LEFT NIPPLE SPARING MASTECTOMY WITH RADIOACTIVE SEED TARGETED LYMPH NODE EXCISION AND LEFT AXILLARY SENTINEL LYMPH NODE BIOPSY;  Surgeon: Rolm Bookbinder, MD;  Location: [place];  Service: General;  Laterality: Left;  REQUESTS RNFA   RECONSTRUCTION BREAST IMMEDIATE / DELAYED W/ TISSUE EXPANDER Bilateral 03/27/2017   BILATERAL BREAST RECONSTRUCTION WITH PLACEMENT OF TISSUE EXPANDER AND ALLODERM   REMOVAL OF BILATERAL TISSUE EXPANDERS WITH PLACEMENT OF BILATERAL BREAST IMPLANTS Bilateral 01/05/2018   Procedure: REMOVAL OF BILATERAL TISSUE EXPANDERS WITH PLACEMENT OF BILATERAL BREAST IMPLANTS;  Surgeon: Irene Limbo, MD;  Location: [place];  Service: Plastics;  Laterality: Bilateral;   SEPTOPLASTY WITH ETHMOIDECTOMY, AND MAXILLARY ANTROSTOMY  10/29/2010   bilat. max. antrostomy with left max. stripping; left ant. ethmoidectomy; right total ethmoidectomy; sphenoidotomy   SHOULDER ARTHROSCOPY WITH SUBACROMIAL DECOMPRESSION AND BICEP TENDON REPAIR Right 07/12/2013   Procedure: RIGHT SHOULDER ARTHROSCOPY DEBRIDEMENT EXTENSIVE DECOMPRESSION SUBACROMIAL PARTIAL ACROMIOPLASTY;  Surgeon: Johnny Bridge, MD;  Location: [place];  Service: Orthopedics;  Laterality: Right;   WRIST ARTHROSCOPY  01/17/2012   Procedure: ARTHROSCOPY WRIST; right wrist Surgeon: Tennis Must, MD;  Location: [place];  Service: Orthopedics;  Laterality: Right;  RIGHT WRIST ARTHROSCOPY WITH TRIANGULAR FIBROCARTILAGE COMPLEX REPAIR AND DEBRIDEMENT      reports that she has never smoked. She has never used smokeless tobacco. She reports current alcohol use. She reports that she does not use drugs.  Allergies  Allergen Reactions   Sumatriptan Other (See Comments)    Numbness to face    Statins Other (See Comments)    Leg pain   Tape Rash and Other (See Comments)    Rash from dressing over port-a-cath     Family History  Problem Relation Age of Onset   Breast cancer Mother 4       triple negative   Leukemia Father    Lung cancer Father    Heart attack Maternal Uncle    Prostate  cancer Paternal Uncle    COPD Paternal Grandmother    Heart disease Paternal Grandfather    Prostate cancer Paternal Uncle    Leukemia Cousin     Prior to Admission medications   Medication Sig Start Date End Date Taking? Authorizing Provider  acetaminophen (TYLENOL) 500 MG tablet Take 1,000 mg by mouth every 8 (eight) hours.    Yes [provider]  ELIQUIS 5 MG TABS tablet Take 5 mg by mouth 2 (two) times daily. 11/21/18  Yes [provider]  FLUoxetine (PROZAC) 40 MG capsule Take 40 mg by mouth every morning.    Yes [provider]  gabapentin (NEURONTIN) 300 MG capsule Take 300 mg by mouth every evening.  11/13/18  Yes [provider]  levothyroxine (SYNTHROID) 88 MCG tablet Take 1 tablet (88 mcg total) by mouth daily before breakfast. 12/06/18  Yes Nicholas Lose, MD  lidocaine-prilocaine (EMLA) cream APPLY 1 APPLICATION TOPICALLY AS NEEDED. Patient taking differently: Apply 1 application topically as needed (for pain).  12/06/18  Yes Nicholas Lose, MD  lisdexamfetamine (VYVANSE) 30 MG capsule Take 30 mg by mouth every morning.    Yes [provider]  mirtazapine (REMERON) 15 MG tablet Take 15 mg by mouth at bedtime.  11/26/18 11/26/19 Yes [provider]  morphine (MS CONTIN) 30 MG 12 hr tablet Take 30 mg by mouth every  12 (twelve) hours.  12/07/18  Yes [provider]  ondansetron (ZOFRAN) 8 MG tablet Take 1 tablet (8 mg total) by mouth 2 (two) times daily as needed (Nausea or vomiting). 12/06/18  Yes Nicholas Lose, MD  Oxycodone HCl 10 MG TABS Take 1 tablet (10 mg total) by mouth every 6 (six) hours as needed. Patient taking differently: Take 10 mg by mouth every 6 (six) hours as needed (for breakthrough pain).  12/06/18  Yes Nicholas Lose, MD  predniSONE (DELTASONE) 10 MG tablet Take 6 tabs po daily x 5 days, 5 tabs po x 2 days, 4 tabs po x 2 days, 3 tabs po x 2 days, 2 tabs po x 2 days, 1 tab po x 2 days, then dc Patient  taking differently: Take 20-70 mg by mouth See admin instructions. Take 70 mg by mouth once a day for 7 days, 60 mg once a day for 7 days, 50 mg once a day for 7 days, 40 mg once a day for 7 days, 30 mg once a day for 7 days, 20 mg once a day for 7 days, then to be re-evaluated 08/07/18  Yes Hayden Pedro, PA-C  prochlorperazine (COMPAZINE) 10 MG tablet Take 1 tablet (10 mg total) by mouth every 6 (six) hours as needed (Nausea or vomiting). 12/06/18  Yes Nicholas Lose, MD  rizatriptan (MAXALT) 10 MG tablet Take 10 mg by mouth daily as needed for migraine (and may repeat once in 2 hours, if no relief).    Yes [provider]  spironolactone (ALDACTONE) 100 MG tablet Take 100 mg by mouth daily.    Yes [provider]  cyclobenzaprine (FLEXERIL) 5 MG tablet Take 5 mg by mouth 3 (three) times daily. 09/13/18   [provider]  lidocaine-prilocaine (EMLA) cream Apply to affected area once Patient not taking: Reported on 12/09/2018 12/06/18   Nicholas Lose, MD  LORazepam (ATIVAN) 1 MG tablet TAKE 1 TABLET BY MOUTH AS NEEDED Patient not taking: Reported on 12/09/2018 08/24/18   Nicholas Lose, MD    Physical Exam: Vitals:   12/09/18 2100 12/09/18 2130 12/09/18 2200 12/09/18 2217  BP: 135/74 123/74 (!) 133/91 (!) 135/93  Pulse: 89 86 (!) 101 87  Resp: 15 16 (!) 21   Temp:    99 F (37.2 C)  TempSrc:    Oral  SpO2: 95% 95% 95%     Physical Exam  Constitutional: She is oriented to person, place, and time. She appears well-developed and well-nourished. No distress.  HENT:  Head: Normocephalic.  Eyes: Right eye exhibits no discharge. Left eye exhibits no discharge.  Neck:  Aspen collar in place Mild facial swelling and erythema  Cardiovascular: Normal rate, regular rhythm and intact distal pulses.  Pulmonary/Chest: Effort normal and breath sounds normal. No respiratory distress. She has no wheezes. She has no rales.  Abdominal: Soft. Bowel sounds are normal. She  exhibits no distension. There is no abdominal tenderness. There is no guarding.  Musculoskeletal:        General: No tenderness.     Comments: Strength 5 out of 5 in bilateral upper and lower extremities. No upper extremity edema appreciated.  Neurological: She is alert and oriented to person, place, and time.  Sensation to light touch intact throughout.  Skin: Skin is warm and dry. She is not diaphoretic.     Labs on Admission: I have personally reviewed following labs and imaging studies  CBC: Recent Labs  Lab 12/09/18 1727  WBC  10.1  NEUTROABS 8.9*  HGB 10.9*  HCT 32.9*  MCV 106.1*  PLT [id number]   Basic Metabolic Panel: Recent Labs  Lab 12/09/18 1727  NA 135  K 4.2  CL 96*  CO2 29  GLUCOSE 119*  BUN 16  CREATININE 0.74  CALCIUM 9.3   GFR: Estimated Creatinine Clearance: 98.1 mL/min (by C-G formula based on SCr of 0.74 mg/dL). Liver Function Tests: Recent Labs  Lab 12/09/18 1727  AST 31  ALT 54*  ALKPHOS 86  BILITOT 0.9  PROT 7.0  ALBUMIN 3.4*   No results for input(s): LIPASE, AMYLASE in the last 168 hours. No results for input(s): AMMONIA in the last 168 hours. Coagulation Profile: No results for input(s): INR, PROTIME in the last 168 hours. Cardiac Enzymes: No results for input(s): CKTOTAL, CKMB, CKMBINDEX, TROPONINI in the last 168 hours. BNP (last 3 results) No results for input(s): PROBNP in the last 8760 hours. HbA1C: No results for input(s): HGBA1C in the last 72 hours. CBG: No results for input(s): GLUCAP in the last 168 hours. Lipid Profile: No results for input(s): CHOL, HDL, LDLCALC, TRIG, CHOLHDL, LDLDIRECT in the last 72 hours. Thyroid Function Tests: No results for input(s): TSH, T4TOTAL, FREET4, T3FREE, THYROIDAB in the last 72 hours. Anemia Panel: No results for input(s): VITAMINB12, FOLATE, FERRITIN, TIBC, IRON, RETICCTPCT in the last 72 hours. Urine analysis: No results found for: COLORURINE, APPEARANCEUR, LABSPEC, [place],  GLUCOSEU, HGBUR, BILIRUBINUR, KETONESUR, PROTEINUR, UROBILINOGEN, NITRITE, LEUKOCYTESUR  Radiological Exams on Admission: Ct Soft Tissue Neck W Contrast  Result Date: 12/09/2018 CLINICAL DATA:  43 year old female with metastatic breast cancer and increasing neck pain and swelling. Currently on chemotherapy. Query as feces syndrome. EXAM: CT NECK WITH CONTRAST TECHNIQUE: Multidetector CT imaging of the neck was performed using the standard protocol following the bolus administration of intravenous contrast. CONTRAST:  181mL OMNIPAQUE IOHEXOL 300 MG/ML SOLN in conjunction with contrast enhanced imaging of the chest reported separately. COMPARISON:  Chest CT today reported separately. Brain MRI 08/02/2018. FINDINGS: Pharynx and larynx: The glottis is closed. Laryngeal and pharyngeal soft tissue contours are within normal limits. Negative parapharyngeal spaces. Negative retropharyngeal space. Salivary glands: Negative sublingual space. Submandibular glands and parotid glands are within normal limits. Thyroid: Diminutive, negative. Lymph nodes: No cervical lymphadenopathy. Diminutive bilateral cervical nodes. Vascular: The bilateral internal jugular veins are patent. The left IJ is dominant but there is a right IJ approach porta cath in place. The visible bilateral subclavian veins are patent. 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 See also chest CT findings today reported separately. In the neck and at the skull base the major vascular structures are patent. Limited intracranial: Negative. Visualized orbits: Negative. Mastoids and visualized paranasal sinuses: Clear. Skeleton: Lytic metastasis in the C3 vertebral body is eccentric to the right and breaks through the posterior cortex (series 14, image 49 and sagittal image 48. No obvious epidural extension. There are patent epidural veins overlying the lesion. No  other osseous metastasis identified in the neck. Upper chest: Reported separately today. IMPRESSION: 1. Patent major vascular structures in the neck and at the skull base. 2. 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 The See CT Chest today reported separately. 3. Lytic C3 vertebral body metastasis which has broken through the posterior cortex, but no epidural tumor is evident by  CT. 4. No other metastatic disease identified in the neck. Electronically Signed   By: Genevie Ann M.D.   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 CONTRAST:  159mL OMNIPAQUE IOHEXOL 300 MG/ML  SOLN COMPARISON:  PET-CT, 08/02/2018 FINDINGS: Cardiovascular: Interval placement of a right chest port catheter. There is new narrowing of the right brachiocephalic vein about the port catheter tubing (series 9, image 46) with new prominent small internal mammary and chest wall venous collaterals about the chest. Normal heart size. No pericardial effusion. Mediastinum/Nodes: No enlarged mediastinal, hilar, or axillary lymph nodes. Thyroid gland, trachea, and esophagus demonstrate no significant findings. Lungs/Pleura: There are numerous bilateral pulmonary nodules of varying sizes, a number of dominant nodule seen on initial staging examination dated 08/02/2018 not significantly changed in size, however with numerous new small pulmonary nodules throughout the lungs, for example a 5 mm nodule in the left lower lobe (series 11, image 86) and adjacent 2-3 mm nodules of the right lower lobe (series 11, image 113). No pleural effusion or pneumothorax. Upper Abdomen: No acute abnormality. Hepatic steatosis. Unchanged  small left adrenal nodule, partially imaged. Musculoskeletal: Status post bilateral mastectomy and implant reconstruction. IMPRESSION: 1. Interval placement of a right chest port catheter. There is new narrowing of the right brachiocephalic vein about the port catheter tubing (series 9, image 46) with new prominent small internal mammary and chest wall venous collaterals about the chest. 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 The superior vena cava itself is widely patent to the cavoatrial junction. A specifically tailored multiphasic CT central venogram may be helpful to further assess functional degree of stenosis and diagnostic/therapeutic interventional venogram can be considered. 2. There are numerous bilateral pulmonary nodules of varying sizes, a number of dominant nodule seen on initial staging examination dated 08/02/2018 not significantly changed in size, however with numerous new small pulmonary nodules throughout the lungs, for example a 5 mm nodule in the left lower lobe (series 11, image 86) and adjacent 2-3 mm nodules of the right lower lobe (series 11, image 113). Findings are consistent with worsened pulmonary metastatic disease. Electronically Signed   By: Eddie Candle M.D.   On: 12/09/2018 19:24    Assessment/Plan Principal Problem:   Neck pain Active Problems:   Depression   Insomnia   Bone metastases (HCC)   Hypothyroidism   Neck pain and facial swelling -CT soft tissue neck showing lytic C3 vertebral body metastasis which is broken through the posterior cortex but no epidural tumor evident on CT.  Neurosurgery has reviewed the patient's images and recommended Aspen collar and MRI C-spine without contrast.  Will consult in a.m. Aspen collar placed in the ED.  MRI C-spine ordered.  Patient continues to have pain despite taking home morphine, oxycodone, gabapentin, and flexeril.  Will continue these medications and also give Dilaudid as needed for severe  pain.  Continuous pulse ox to monitor oxygen saturation. -CT chest showing new narrowing of the right brachiocephalic vein about the Port-A-Cath tubing with new prominent small internal mammary and chest wall venous collaterals about the chest.  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  SVC widely patent to the cavoatrial junction.  Radiology recommending CT venogram.  Unable to find order for the study in epic.  Discussed with on-call radiologist  who recommended contacting radiology department in the daytime so that this study can be scheduled.  Breast cancer with osseous mets and metastasis to lungs Followed by oncology here in [place] and involved in a clinical trial at [facility].  Per last [facility] oncology note, patient has had progression of disease after 3 cycles of carboplatin and plan is for her to follow-up with Dr. Lindi Adie to discuss treatment options.  CT done in the ED today showing lytic C3 vertebral body metastasis and worsening pulmonary metastatic disease.  Please read report for details. -Consult oncology in a.m. -Pain management  History of provoked right upper extremity DVT in the setting of metastatic cancer -Continue Eliquis for anticoagulation  Macrocytic anemia -Hemoglobin 10.9, was 13.3 on 08/08/2018.  Likely related to chemotherapy for breast cancer.  Patient is not endorsing any symptoms of GI bleed.  Depression/ insomnia -Continue Prozac, mirtazapine  Hypothyroidism -Continue Synthroid  DVT prophylaxis: Continue Eliquis Code Status: Full code Family Communication: Husband at bedside. Disposition Plan: Anticipate discharge after clinical improvement. Consults called: None Admission status: It is my clinical opinion that referral for OBSERVATION is reasonable and necessary in this patient based on the above information provided. The aforementioned taken together are felt to place the patient at high risk for further clinical  deterioration. However it is anticipated that the patient may be medically stable for discharge from the hospital within 24 to 48 hours.  The medical decision making on this patient was of high complexity and the patient is at high risk for clinical deterioration, therefore this is a level 3 visit.  Shela Leff MD [facility] Pager [phone]  If 7PM-7AM, please contact night-coverage [email or web address] Password [facility]  12/09/2018, 10:32 PM

## 2018-12-09 NOTE — ED Triage Notes (Signed)
Patient developed bilateral posterior neck pain yesterday. 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 Also has right groin pain. Takes po morphine 30 x 2 daily and no relief

## 2018-12-10 ENCOUNTER — Inpatient Hospital Stay (HOSPITAL_COMMUNITY): Payer: 59

## 2018-12-10 ENCOUNTER — Ambulatory Visit
Admission: RE | Admit: 2018-12-10 | Discharge: 2018-12-10 | Disposition: A | Discharge status: Home / Self Care | Payer: 59 | Source: Ambulatory Visit | Attending: Radiation Oncology | Admitting: Radiation Oncology

## 2018-12-10 ENCOUNTER — Observation Stay (HOSPITAL_COMMUNITY): Payer: 59

## 2018-12-10 ENCOUNTER — Encounter (HOSPITAL_COMMUNITY): Payer: Self-pay

## 2018-12-10 DIAGNOSIS — F419 Anxiety disorder, unspecified: Secondary | ICD-10-CM | POA: Diagnosis present

## 2018-12-10 DIAGNOSIS — F329 Major depressive disorder, single episode, unspecified: Secondary | ICD-10-CM | POA: Diagnosis present

## 2018-12-10 DIAGNOSIS — Z20828 Contact with and (suspected) exposure to other viral communicable diseases: Secondary | ICD-10-CM | POA: Diagnosis present

## 2018-12-10 DIAGNOSIS — I871 Compression of vein: Secondary | ICD-10-CM | POA: Diagnosis present

## 2018-12-10 DIAGNOSIS — K5900 Constipation, unspecified: Secondary | ICD-10-CM | POA: Diagnosis present

## 2018-12-10 DIAGNOSIS — Z79890 Hormone replacement therapy (postmenopausal): Secondary | ICD-10-CM | POA: Diagnosis not present

## 2018-12-10 DIAGNOSIS — Z9013 Acquired absence of bilateral breasts and nipples: Secondary | ICD-10-CM | POA: Diagnosis not present

## 2018-12-10 DIAGNOSIS — D630 Anemia in neoplastic disease: Secondary | ICD-10-CM | POA: Diagnosis present

## 2018-12-10 DIAGNOSIS — E785 Hyperlipidemia, unspecified: Secondary | ICD-10-CM | POA: Diagnosis present

## 2018-12-10 DIAGNOSIS — M542 Cervicalgia: Secondary | ICD-10-CM | POA: Diagnosis not present

## 2018-12-10 DIAGNOSIS — E876 Hypokalemia: Secondary | ICD-10-CM | POA: Diagnosis present

## 2018-12-10 DIAGNOSIS — Z86718 Personal history of other venous thrombosis and embolism: Secondary | ICD-10-CM | POA: Diagnosis not present

## 2018-12-10 DIAGNOSIS — S14113A Complete lesion at C3 level of cervical spinal cord, initial encounter: Secondary | ICD-10-CM | POA: Diagnosis not present

## 2018-12-10 DIAGNOSIS — E039 Hypothyroidism, unspecified: Secondary | ICD-10-CM | POA: Diagnosis present

## 2018-12-10 DIAGNOSIS — S72001A Fracture of unspecified part of neck of right femur, initial encounter for closed fracture: Secondary | ICD-10-CM | POA: Diagnosis not present

## 2018-12-10 DIAGNOSIS — C7951 Secondary malignant neoplasm of bone: Principal | ICD-10-CM

## 2018-12-10 DIAGNOSIS — E7800 Pure hypercholesterolemia, unspecified: Secondary | ICD-10-CM | POA: Diagnosis present

## 2018-12-10 DIAGNOSIS — F988 Other specified behavioral and emotional disorders with onset usually occurring in childhood and adolescence: Secondary | ICD-10-CM | POA: Diagnosis present

## 2018-12-10 DIAGNOSIS — D6481 Anemia due to antineoplastic chemotherapy: Secondary | ICD-10-CM | POA: Diagnosis present

## 2018-12-10 DIAGNOSIS — C7800 Secondary malignant neoplasm of unspecified lung: Secondary | ICD-10-CM | POA: Diagnosis present

## 2018-12-10 DIAGNOSIS — M84550A Pathological fracture in neoplastic disease, pelvis, initial encounter for fracture: Secondary | ICD-10-CM | POA: Diagnosis present

## 2018-12-10 DIAGNOSIS — Z006 Encounter for examination for normal comparison and control in clinical research program: Secondary | ICD-10-CM | POA: Diagnosis not present

## 2018-12-10 DIAGNOSIS — Z95828 Presence of other vascular implants and grafts: Secondary | ICD-10-CM | POA: Diagnosis not present

## 2018-12-10 DIAGNOSIS — T451X5A Adverse effect of antineoplastic and immunosuppressive drugs, initial encounter: Secondary | ICD-10-CM | POA: Diagnosis present

## 2018-12-10 DIAGNOSIS — Z888 Allergy status to other drugs, medicaments and biological substances status: Secondary | ICD-10-CM | POA: Diagnosis not present

## 2018-12-10 DIAGNOSIS — G893 Neoplasm related pain (acute) (chronic): Secondary | ICD-10-CM | POA: Diagnosis present

## 2018-12-10 DIAGNOSIS — Z7901 Long term (current) use of anticoagulants: Secondary | ICD-10-CM | POA: Diagnosis not present

## 2018-12-10 LAB — HIV ANTIBODY (ROUTINE TESTING W REFLEX): HIV Screen 4th Generation wRfx: NONREACTIVE

## 2018-12-10 LAB — SARS CORONAVIRUS 2 (TAT 6-24 HRS): SARS Coronavirus 2: NEGATIVE

## 2018-12-10 MED ORDER — LORAZEPAM 1 MG PO TABS
1.0000 mg | ORAL_TABLET | Freq: Two times a day (BID) | ORAL | Status: DC | PRN
  Filled 2018-12-10: qty 1

## 2018-12-10 MED ORDER — POLYETHYLENE GLYCOL 3350 17 G PO PACK
17.0000 g | PACK | Freq: Every day | ORAL | Status: DC
  Administered 2018-12-10 – 2018-12-14 (×5): 17 g via ORAL
  Filled 2018-12-10 (×5): qty 1

## 2018-12-10 MED ORDER — HYDROMORPHONE HCL 1 MG/ML IJ SOLN
2.0000 mg | INTRAMUSCULAR | Status: DC | PRN
  Administered 2018-12-10 – 2018-12-14 (×10): 2 mg via INTRAVENOUS
  Filled 2018-12-10 (×11): qty 2

## 2018-12-10 MED ORDER — OXYCODONE HCL 5 MG PO TABS
10.0000 mg | ORAL_TABLET | ORAL | Status: DC | PRN
  Administered 2018-12-11 – 2018-12-14 (×10): 10 mg via ORAL
  Filled 2018-12-10 (×10): qty 2

## 2018-12-10 MED ORDER — INFLUENZA VAC SPLIT QUAD 0.5 ML IM SUSY: 0.5000 mL | PREFILLED_SYRINGE | INTRAMUSCULAR | Status: DC | PRN

## 2018-12-10 MED ORDER — PREDNISONE 5 MG PO TABS
30.0000 mg | ORAL_TABLET | Freq: Every day | ORAL | Status: DC
  Administered 2018-12-10 – 2018-12-14 (×5): 30 mg via ORAL
  Filled 2018-12-10 (×6): qty 1

## 2018-12-10 MED ORDER — SENNOSIDES-DOCUSATE SODIUM 8.6-50 MG PO TABS
1.0000 | ORAL_TABLET | Freq: Two times a day (BID) | ORAL | Status: AC
  Administered 2018-12-10 – 2018-12-11 (×3): 1 via ORAL
  Filled 2018-12-10 (×3): qty 1

## 2018-12-10 MED ORDER — HYDROMORPHONE HCL 1 MG/ML IJ SOLN
1.5000 mg | INTRAMUSCULAR | Status: DC | PRN
  Administered 2018-12-10: 1.5 mg via INTRAVENOUS
  Filled 2018-12-10: qty 2

## 2018-12-10 NOTE — Progress Notes (Addendum)
[facility] PROGRESS NOTE  Espyn Dyes Whitcomb [id number] DOB: 03-Oct-1975 DOA: 12/09/2018 PCP: [name], MD  Assessment/Plan: Neck pain and facial swelling/lesion C3. CT soft tissue neck showing lytic C3 vertebral body metastasis which is broken through the posterior cortex but no epidural tumor evident on CT.  Neurosurgery evaluated and opined no surgical intervention at this time but recommended evaluation by radiation onc for possible stereotactic radiosurgery. Soft c collar ok. Continues with pain this am.  -increased IV dilaudid to 2 mg.  -continue home morphine, oxycodone, gabapentin.  Brachiocephalic vein stenosis. Marland Kitchen -CT chest showing new narrowing of the right brachiocephalic vein about the Port-A-Cath tubing with new prominent small internal mammary and chest wall venous collaterals about the chest.  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  SVC widely patent to the cavoatrial junction. discussed with Dr Vernard Gambles radiology who opined there is a stenosis but that left IJ big and patent and collateral venous drainage will be slow but suffice. He reports no clot burden to be dealt with at this time. 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  Per last [facility] oncology note, patient has had progression of disease after 3 cycles of carboplatin and plan is for her to follow-up with Dr. Lindi Adie to discuss treatment options.  CT as noted above. -oncology following -Pain management   History of provoked right upper extremity DVT in the setting of metastatic cancer -Continue Eliquis for anticoagulation   Macrocytic anemia -Hemoglobin 10.9, was 13.3 on 08/08/2018.  Likely related to chemotherapy for breast cancer.  Patient is not endorsing any symptoms of GI bleed.   Depression/  insomnia -Continue Prozac, mirtazapine   Hypothyroidism -Continue Synthroid     Code Status: full conde Family Communication: Dr. Eliseo Squires discussed with husband Disposition Plan: in hospital until Friday   Consultants: [facility] neurosurgery Oncology Radiation oncology    HPI/Subjective: Sitting up in bed watching tv. Reports no pain relief with 1mg  iv dilaudid  Objective: Vitals:   12/10/18 0810 12/10/18 1129  BP: 110/83   Pulse: 96   Resp: 20 16  Temp: 98.3 F (36.8 C) 99.1 F (37.3 C)  SpO2: 95%     Intake/Output Summary (Last 24 hours) at 12/10/2018 1533 Last data filed at 12/10/2018 0900 Gross per 24 hour  Intake 240 ml  Output --  Net 240 ml   There were no vitals filed for this visit.  Exam:  General:  Awake alert no acute distress Cardiovascular: rrr no mgr no LE edema Respiratory: normal effort BS clear bilaterally no wheeze Abdomen: abdomen soft +BS no guarding or rebounding Musculoskeletal: joints without swelling/erythema HEENT: face with bilateral puffyness. Decreased rom of head due to neck pain   Data Reviewed: Basic Metabolic Panel: Recent Labs  Lab 12/09/18 1727  NA 135  K 4.2  CL 96*  CO2 29  GLUCOSE 119*  BUN 16  CREATININE 0.74  CALCIUM 9.3   Liver Function Tests: Recent Labs  Lab 12/09/18 1727  AST 31  ALT 54*  ALKPHOS 86  BILITOT 0.9  PROT 7.0  ALBUMIN 3.4*   No results for input(s): LIPASE, AMYLASE in the last 168 hours. No results for input(s): AMMONIA in the last 168 hours. CBC: Recent Labs  Lab 12/09/18 1727  WBC 10.1  NEUTROABS 8.9*  HGB 10.9*  HCT 32.9*  MCV 106.1*  PLT 232   Cardiac Enzymes: No results for input(s): CKTOTAL, CKMB, CKMBINDEX, TROPONINI in the last 168 hours. BNP (last 3 results) No results for input(s): BNP in the last 8760 hours.  ProBNP (last 3 results) No results for input(s): PROBNP in the last 8760 hours.  CBG: No results for input(s): GLUCAP in the last 168 hours.  Recent  Results (from the past 240 hour(s))  SARS CORONAVIRUS 2 (TAT 6-24 HRS) Nasopharyngeal Nasopharyngeal Swab     Status: None   Collection Time: 12/09/18  8:24 PM   Specimen: Nasopharyngeal Swab  Result Value Ref Range Status   SARS Coronavirus 2 NEGATIVE NEGATIVE Final    Comment: (NOTE) SARS-CoV-2 target nucleic acids are NOT DETECTED. The SARS-CoV-2 RNA is generally detectable in upper and lower respiratory specimens during the acute phase of infection. Negative results do not preclude SARS-CoV-2 infection, do not rule out co-infections with other pathogens, and should not be used as the sole basis for treatment or other patient management decisions. Negative results must be combined with clinical observations, patient history, and epidemiological information. The expected result is Negative. Fact Sheet for Patients: SugarRoll.be Fact Sheet for Healthcare Providers: https://www.woods-mathews.com/ This test is not yet approved or cleared by the [place] FDA and  has been authorized for detection and/or diagnosis of SARS-CoV-2 by FDA under an Emergency Use Authorization (EUA). This EUA will remain  in effect (meaning this test can be used) for the duration of the COVID-19 declaration under Section 56 4(b)(1) of the Act, 21 U.S.C. section 360bbb-3(b)(1), unless the authorization is terminated or revoked sooner. Performed at [facility], [place] [address] [place], [place] [address]      Studies: Ct Soft Tissue Neck W Contrast  Result Date: 12/09/2018 CLINICAL DATA:  43 year old female with metastatic breast cancer and increasing neck pain and swelling. Currently on chemotherapy. Query as feces syndrome. EXAM: CT NECK WITH CONTRAST TECHNIQUE: Multidetector CT imaging of the neck was performed using the standard protocol following the bolus administration of intravenous contrast. CONTRAST:  18mL OMNIPAQUE IOHEXOL 300 MG/ML SOLN in  conjunction with contrast enhanced imaging of the chest reported separately. COMPARISON:  Chest CT today reported separately. Brain MRI 08/02/2018. FINDINGS: Pharynx and larynx: The glottis is closed. Laryngeal and pharyngeal soft tissue contours are within normal limits. Negative parapharyngeal spaces. Negative retropharyngeal space. Salivary glands: Negative sublingual space. Submandibular glands and parotid glands are within normal limits. Thyroid: Diminutive, negative. Lymph nodes: No cervical lymphadenopathy. Diminutive bilateral cervical nodes. Vascular: The bilateral internal jugular veins are patent. The left IJ is dominant but there is a right IJ approach porta cath in place. The visible bilateral subclavian veins are patent. 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 See also chest CT findings today reported separately. In the neck and at the skull base the major vascular structures are patent. Limited intracranial: Negative. Visualized orbits: Negative. Mastoids and visualized paranasal sinuses: Clear. Skeleton: Lytic metastasis in the C3 vertebral body is eccentric to the right and breaks through the posterior cortex (series 14, image 49 and sagittal image 48. No obvious epidural extension. There are patent epidural veins overlying the lesion. No other osseous metastasis identified in the neck. Upper chest: Reported separately today. IMPRESSION: 1. Patent major vascular structures in the neck and at the skull base. 2. There is functional stenosis of the right innominate vein related to the  right IJ approach port catheter, but the visible SVC remains patent. The See CT Chest today reported separately. 3. Lytic C3 vertebral body metastasis which has broken through the posterior cortex, but no epidural tumor is evident by CT. 4. No other metastatic disease identified in the neck. Electronically Signed   By: Genevie Ann M.D.   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 CONTRAST:  188mL OMNIPAQUE IOHEXOL 300 MG/ML  SOLN COMPARISON:  PET-CT, 08/02/2018 FINDINGS: Cardiovascular: Interval placement of a right chest port catheter. There is new narrowing of the right brachiocephalic vein about the port catheter tubing (series 9, image 46) with new prominent small internal mammary and chest wall venous collaterals about the chest. Normal heart size. No pericardial effusion. Mediastinum/Nodes: No enlarged mediastinal, hilar, or axillary lymph nodes. Thyroid gland, trachea, and esophagus demonstrate no significant findings. Lungs/Pleura: There are numerous bilateral pulmonary nodules of varying sizes, a number of dominant nodule seen on initial staging examination dated 08/02/2018 not significantly changed in size, however with numerous new small pulmonary nodules throughout the lungs, for example a 5 mm nodule in the left lower lobe (series 11, image 86) and adjacent 2-3 mm nodules of the right lower lobe (series 11, image 113). No pleural effusion or pneumothorax. Upper Abdomen: No acute abnormality. Hepatic steatosis. Unchanged small left adrenal nodule, partially imaged. Musculoskeletal: Status post bilateral mastectomy and implant reconstruction. IMPRESSION: 1. Interval placement of a right chest port catheter. There is new narrowing of the right brachiocephalic vein about the port catheter tubing (series 9, image 46) with new prominent small internal mammary and chest wall venous collaterals about the chest. 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 The superior vena cava itself is widely patent to the cavoatrial junction. A specifically tailored multiphasic CT  central venogram may be helpful to further assess functional degree of stenosis and diagnostic/therapeutic interventional venogram can be considered. 2. There are numerous bilateral pulmonary nodules of varying sizes, a number of dominant nodule seen on initial staging examination dated 08/02/2018 not significantly changed in size, however with numerous new small pulmonary nodules throughout the lungs, for example a 5 mm nodule in the left lower lobe (series 11, image 86) and adjacent 2-3 mm nodules of the right lower lobe (series 11, image 113). Findings are consistent with worsened pulmonary metastatic disease. Electronically Signed   By: Eddie Candle M.D.   On: 12/09/2018 19:24   Mr Cervical Spine Wo Contrast  Result Date: 12/10/2018 CLINICAL DATA:  Onset severe right neck pain 2-3 days ago. History of metastatic breast cancer. EXAM: MRI CERVICAL SPINE WITHOUT CONTRAST TECHNIQUE: Multiplanar, multisequence MR imaging of the cervical spine was performed. No intravenous contrast was administered. COMPARISON:  None. FINDINGS: Alignment: Maintained. Vertebrae: There is a lesion in the right side of the C3 vertebral body measuring approximately 1.3 cm AP x 1.3 cm transverse x 1.2 cm craniocaudal highly suspicious for a metastatic deposit. T2 hyperintensity is also seen in the left transverse processes of C7 and T1 worrisome for metastatic disease. No fracture. Cord: Normal signal throughout. Posterior Fossa, vertebral arteries, paraspinal tissues: Negative. Disc levels: C2-3: Mild disc bulge without stenosis. C3-4: The posterior margin of C3 on the right is mildly expanded by tumor deposit but the central canal and foramina appear open.  No disc bulge or protrusion. C4-5: Minimal disc bulge without stenosis. C5-6: There is a shallow disc bulge and some uncovertebral disease. No stenosis. C6-7: Negative. C7-T1: Negative. IMPRESSION: Findings most consistent with metastatic disease in the C3 vertebral body and left  transverse processes of C7 and T1. The posterior margin of C3 is mildly expanded but no central canal or foraminal stenosis is identified. Postcontrast imaging to evaluate for epidural tumor extension is recommended. Whole-body bone scan could be used to evaluate for additional sites of metastatic disease. These results will be called to the ordering clinician or representative by the Radiologist Assistant, and communication documented in the PACS or zVision Dashboard. Electronically Signed   By: Inge Rise M.D.   On: 12/10/2018 05:38    Scheduled Meds:  acetaminophen  1,000 mg Oral Q8H   apixaban  5 mg Oral BID   FLUoxetine  40 mg Oral Daily   gabapentin  300 mg Oral QPM   levothyroxine  88 mcg Oral QAC breakfast   lisdexamfetamine  30 mg Oral Q breakfast   mirtazapine  15 mg Oral QHS   morphine  30 mg Oral Q12H   polyethylene glycol  17 g Oral Daily   predniSONE  30 mg Oral Q breakfast   senna-docusate  1 tablet Oral BID   Continuous Infusions:   Principal Problem:   Neck pain Active Problems:   Bone metastases (HCC)   Pain from bone metastases (HCC)   Stenosis of brachiocephalic vein   Hypothyroidism   Depression   Insomnia   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

## 2018-12-10 NOTE — Progress Notes (Signed)
We were notified of the patient's admission today. Dr. [name] is going to evaluate her MRI scans prior to making recommendations for treatment to her cervical spine disease.  Fortunately it does not appear that there's any compromise To her spinal cord. We will be in touch tomorrow with the patient.  Carola Rhine, PAC

## 2018-12-10 NOTE — Progress Notes (Signed)
Radiation Oncology         [phone] ________________________________  Name: Laura Anthony MRN: [id number]  Date: 12/09/2018  DOB: February 21, 1975  Chart Note:  I reviewed this patient's most recent findings and wanted to take a minute to document my impression.  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 Currently on chemotherapy. Query as feces syndrome. EXAM: CT NECK WITH CONTRAST TECHNIQUE: Multidetector CT imaging of the neck was performed using the standard protocol following the bolus administration of intravenous contrast. CONTRAST:  172m OMNIPAQUE IOHEXOL 300 MG/ML SOLN in conjunction with contrast enhanced imaging of the chest reported separately. COMPARISON:  Chest CT today reported separately. Brain MRI 08/02/2018. FINDINGS: Pharynx and larynx: The glottis is closed. Laryngeal and pharyngeal soft tissue contours are within normal limits. Negative parapharyngeal spaces. Negative retropharyngeal space. Salivary glands: Negative sublingual space. Submandibular glands and parotid glands are within normal limits. Thyroid: Diminutive, negative. Lymph nodes: No cervical lymphadenopathy. Diminutive bilateral cervical nodes. Vascular: The bilateral internal jugular veins are patent. The left IJ is dominant but there is a right IJ approach porta cath in place. The visible bilateral subclavian veins are patent. 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 See also chest CT findings today reported separately. In the neck and at the skull base the major vascular structures  are patent. Limited intracranial: Negative. Visualized orbits: Negative. Mastoids and visualized paranasal sinuses: Clear. Skeleton: Lytic metastasis in the C3 vertebral body is eccentric to the right and breaks through the posterior cortex (series 14, image 49 and sagittal image 48. No obvious epidural extension. There are patent epidural veins overlying the lesion. No other osseous metastasis identified in the neck. Upper chest: Reported separately today. IMPRESSION: 1. Patent major vascular structures in the neck and at the skull base. 2. 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 The See CT Chest today reported separately. 3. Lytic C3 vertebral body metastasis which has broken through the posterior cortex, but no epidural tumor is evident by CT. 4. No other metastatic disease identified in the neck. Electronically Signed   By: HGenevie AnnM.D.   On: 12/09/2018 19:07   Mr Cervical Spine Wo Contrast  Result Date: 12/10/2018 CLINICAL DATA:  Onset severe right neck pain 2-3 days ago. History of metastatic breast cancer. EXAM: MRI CERVICAL SPINE WITHOUT CONTRAST TECHNIQUE: Multiplanar, multisequence MR imaging of the cervical spine was performed. No intravenous contrast was administered. COMPARISON:  None. FINDINGS: Alignment: Maintained. Vertebrae: There is a lesion in the right side of the C3 vertebral body measuring approximately 1.3 cm AP x 1.3 cm transverse x 1.2 cm craniocaudal highly suspicious for a metastatic deposit. T2 hyperintensity is also seen in the left transverse processes of C7 and T1 worrisome for metastatic disease. No fracture. Cord: Normal signal throughout. Posterior Fossa, vertebral arteries, paraspinal tissues: Negative. Disc levels: C2-3: Mild disc bulge without stenosis. C3-4: The posterior margin of C3 on the right is mildly expanded by tumor deposit but the central canal and  foramina appear  open. No disc bulge or protrusion. C4-5: Minimal disc bulge without stenosis. C5-6: There is a shallow disc bulge and some uncovertebral disease. No stenosis. C6-7: Negative. C7-T1: Negative. IMPRESSION: Findings most consistent with metastatic disease in the C3 vertebral body and left transverse processes of C7 and T1. The posterior margin of C3 is mildly expanded but no central canal or foraminal stenosis is identified. Postcontrast imaging to evaluate for epidural tumor extension is recommended. Whole-body bone scan could be used to evaluate for additional sites of metastatic disease. These results will be called to the ordering clinician or representative by the Radiologist Assistant, and communication documented in the PACS or zVision Dashboard. Electronically Signed   By: Inge Rise M.D.   On: 12/10/2018 05:38   Impression/Plan:  In light of this information, the patient will be transferred to [facility] for consideration of the cervical spine  ________________________________  Sheral Apley. Tammi Klippel, M.D.

## 2018-12-10 NOTE — Consult Note (Signed)
Reason for Consult: c3 met Referring Physician: edp  Laura Anthony is an 43 y.o. female.   HPI:  Very pleasant 42 year old presented to the ED last night after severe neck pain. She has a history of metastatic breast cancer that has spread to her lungs, ribs, lumbar spine, right elbow, and pelvis. She is currently getting chemo treatment. Her neck pain has progressively gotten worse. Denies any radicular symptoms or weakness down her arms. Dr. [name] is her radiation doctor.    Past Medical History:  Diagnosis Date  . ADD (attention deficit disorder)   . Anemia   . Anxiety   . Breast cancer, left breast ([place])    S/P mastectomy 03/27/2017  . DVT (deep venous thrombosis) ([place]) 2017   calf left - probably due to [facility] pills-took eliquis x3 mos, nonthing now  . High cholesterol   . Impingement syndrome of right shoulder 07/2013  . Migraine    "usually 1/month" (03/28/2017)  . PONV (postoperative nausea and vomiting)   . Right bicipital tenosynovitis 07/2013  . Rotator cuff impingement syndrome of right shoulder 07/12/2013  . Seizures ([place])    x 1 as a child - was never on anticonvulsants (03/28/2017)    Past Surgical History:  Procedure Laterality Date  . ADENOIDECTOMY  1981  . ANKLE ARTHROSCOPY Right   . BREAST BIOPSY Left 10/2016  . KNEE ARTHROSCOPY Right   . KNEE ARTHROSCOPY W/ ACL RECONSTRUCTION Left   . LIPOSUCTION WITH LIPOFILLING Bilateral 01/05/2018   Procedure: LIPOFILLING FROM ABDOMEN TO BILATERAL CHEST;  Surgeon: Irene Limbo, MD;  Location: [place];  Service: Plastics;  Laterality: Bilateral;  . MASTECTOMY Left 03/27/2017   NIPPLE SPARING MASTECTOMY WITH RADIOACTIVE SEED TARGETED LYMPH NODE EXCISION AND LEFT AXILLARY SENTINEL LYMPH NODE BIOPSY  . MASTECTOMY Right 03/27/2017   RIGHT PROPHYLACTIC NIPPLE SPARING MASTECTOMY  . NIPPLE SPARING MASTECTOMY Right 03/27/2017   Procedure: RIGHT PROPHYLACTIC NIPPLE SPARING MASTECTOMY;  Surgeon: Rolm Bookbinder, MD;  Location: [place];  Service: General;  Laterality: Right;  . PORT-A-CATH REMOVAL Right 01/05/2018   Procedure: REMOVAL RIGHT CHEST PORT;  Surgeon: Irene Limbo, MD;  Location: [place];  Service: Plastics;  Laterality: Right;  . PORTACATH PLACEMENT N/A 11/01/2016   Procedure: INSERTION PORT-A-CATH WITH [place];  Surgeon: Rolm Bookbinder, MD;  Location: [place];  Service: General;  Laterality: N/A;  . PORTACATH PLACEMENT N/A 08/23/2018   Procedure: INSERTION PORT-A-CATH WITH ULTRASOUND;  Surgeon: Rolm Bookbinder, MD;  Location: [place];  Service: General;  Laterality: N/A;  . RADIOACTIVE SEED GUIDED AXILLARY SENTINEL LYMPH NODE Left 03/27/2017   Procedure: LEFT NIPPLE SPARING MASTECTOMY WITH RADIOACTIVE SEED TARGETED LYMPH NODE EXCISION AND LEFT AXILLARY SENTINEL LYMPH NODE BIOPSY;  Surgeon: Rolm Bookbinder, MD;  Location: [place];  Service: General;  Laterality: Left;  REQUESTS RNFA  . RECONSTRUCTION BREAST IMMEDIATE / DELAYED W/ TISSUE EXPANDER Bilateral 03/27/2017   BILATERAL BREAST RECONSTRUCTION WITH PLACEMENT OF TISSUE EXPANDER AND ALLODERM  . REMOVAL OF BILATERAL TISSUE EXPANDERS WITH PLACEMENT OF BILATERAL BREAST IMPLANTS Bilateral 01/05/2018   Procedure: REMOVAL OF BILATERAL TISSUE EXPANDERS WITH PLACEMENT OF BILATERAL BREAST IMPLANTS;  Surgeon: Irene Limbo, MD;  Location: [place];  Service: Plastics;  Laterality: Bilateral;  . SEPTOPLASTY WITH ETHMOIDECTOMY, AND MAXILLARY ANTROSTOMY  10/29/2010   bilat. max. antrostomy with left max. stripping; left ant. ethmoidectomy; right total ethmoidectomy; sphenoidotomy  . SHOULDER ARTHROSCOPY WITH SUBACROMIAL DECOMPRESSION AND BICEP TENDON REPAIR  Right 07/12/2013   Procedure: RIGHT SHOULDER ARTHROSCOPY DEBRIDEMENT EXTENSIVE DECOMPRESSION SUBACROMIAL PARTIAL ACROMIOPLASTY;  Surgeon: Johnny Bridge, MD;  Location: [place];   Service: Orthopedics;  Laterality: Right;  . WRIST ARTHROSCOPY  01/17/2012   Procedure: ARTHROSCOPY WRIST; right wrist Surgeon: Tennis Must, MD;  Location: [place];  Service: Orthopedics;  Laterality: Right;  RIGHT WRIST ARTHROSCOPY WITH TRIANGULAR FIBROCARTILAGE COMPLEX REPAIR AND DEBRIDEMENT     Allergies  Allergen Reactions  . Sumatriptan Other (See Comments)    Numbness to face   . Statins Other (See Comments)    Leg pain  . Tape Rash and Other (See Comments)    Rash from dressing over port-a-cath     Social History   Tobacco Use  . Smoking status: Never Smoker  . Smokeless tobacco: Never Used  Substance Use Topics  . Alcohol use: Yes    Comment: Drinks very rare    Family History  Problem Relation Age of Onset  . Breast cancer Mother 7       triple negative  . Leukemia Father   . Lung cancer Father   . Heart attack Maternal Uncle   . Prostate cancer Paternal Uncle   . COPD Paternal Grandmother   . Heart disease Paternal Grandfather   . Prostate cancer Paternal Uncle   . Leukemia Cousin      Review of Systems  Positive ROS: as above  All other systems have been reviewed and were otherwise negative with the exception of those mentioned in the HPI and as above.  Objective: Vital signs in last 24 hours: Temp:  [98.3 F (36.8 C)-99.1 F (37.3 C)] 98.3 F (36.8 C) (11/09 0810) Pulse Rate:  [71-101] 96 (11/09 0810) Resp:  [11-22] 20 (11/09 0810) BP: (110-139)/(74-95) 110/83 (11/09 0810) SpO2:  [94 %-97 %] 95 % (11/09 0810)  General Appearance: Alert, cooperative, no distress, appears stated age Head: Normocephalic, without obvious abnormality, atraumatic Neck: Supple, symmetrical, trachea midline, no adenopathy; thyroid: No enlargement/tenderness/nodules; no carotid bruit or JVD, tender upon palpation and pain with rotation Lungs:  respirations unlabored Heart: Regular rate and rhythm Skin: Skin color, texture, turgor normal, no rashes or  lesions  NEUROLOGIC:   Mental status: A&O x4, no aphasia, good attention span, Memory and fund of knowledge Motor Exam - grossly normal, normal tone and bulk Sensory Exam - grossly normal Reflexes: symmetric, no pathologic reflexes, No Hoffman's, No clonus Coordination - not tested Gait - not tested Balance - not tested Cranial Nerves: I: smell Not tested  II: visual acuity  OS: na    OD: na  II: visual fields Full to confrontation  II: pupils Equal, round, reactive to light  III,VII: ptosis None  III,IV,VI: extraocular muscles  Full ROM  V: mastication   V: facial light touch sensation    V,VII: corneal reflex    VII: facial muscle function - upper    VII: facial muscle function - lower   VIII: hearing   IX: soft palate elevation    IX,X: gag reflex   XI: trapezius strength    XI: sternocleidomastoid strength   XI: neck flexion strength    XII: tongue strength      Data Review Lab Results  Component Value Date   WBC 10.1 12/09/2018   HGB 10.9 (L) 12/09/2018   HCT 32.9 (L) 12/09/2018   MCV 106.1 (H) 12/09/2018   PLT 232 12/09/2018   Lab Results  Component Value Date   NA  135 12/09/2018   K 4.2 12/09/2018   CL 96 (L) 12/09/2018   CO2 29 12/09/2018   BUN 16 12/09/2018   CREATININE 0.74 12/09/2018   GLUCOSE 119 (H) 12/09/2018   Lab Results  Component Value Date   INR 1.1 08/08/2018    Radiology: Ct Soft Tissue Neck W Contrast  Result Date: 12/09/2018 CLINICAL DATA:  43 year old female with metastatic breast cancer and increasing neck pain and swelling. Currently on chemotherapy. Query as feces syndrome. EXAM: CT NECK WITH CONTRAST TECHNIQUE: Multidetector CT imaging of the neck was performed using the standard protocol following the bolus administration of intravenous contrast. CONTRAST:  144m OMNIPAQUE IOHEXOL 300 MG/ML SOLN in conjunction with contrast enhanced imaging of the chest reported separately. COMPARISON:  Chest CT today reported separately. Brain  MRI 08/02/2018. FINDINGS: Pharynx and larynx: The glottis is closed. Laryngeal and pharyngeal soft tissue contours are within normal limits. Negative parapharyngeal spaces. Negative retropharyngeal space. Salivary glands: Negative sublingual space. Submandibular glands and parotid glands are within normal limits. Thyroid: Diminutive, negative. Lymph nodes: No cervical lymphadenopathy. Diminutive bilateral cervical nodes. Vascular: The bilateral internal jugular veins are patent. The left IJ is dominant but there is a right IJ approach porta cath in place. The visible bilateral subclavian veins are patent. 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 See also chest CT findings today reported separately. In the neck and at the skull base the major vascular structures are patent. Limited intracranial: Negative. Visualized orbits: Negative. Mastoids and visualized paranasal sinuses: Clear. Skeleton: Lytic metastasis in the C3 vertebral body is eccentric to the right and breaks through the posterior cortex (series 14, image 49 and sagittal image 48. No obvious epidural extension. There are patent epidural veins overlying the lesion. No other osseous metastasis identified in the neck. Upper chest: Reported separately today. IMPRESSION: 1. Patent major vascular structures in the neck and at the skull base. 2. 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 The See CT Chest today reported separately. 3. Lytic C3 vertebral body metastasis which has broken through the posterior cortex, but no epidural tumor is evident by CT. 4. No other metastatic disease identified in the neck. Electronically Signed   By: HGenevie AnnM.D.   On: 12/09/2018 19:07   Ct Chest W Contrast  Result Date: 12/09/2018 CLINICAL DATA:  Neck pain, questionable SVC  syndrome, metastatic breast cancer EXAM: CT CHEST WITH CONTRAST TECHNIQUE: Multidetector CT imaging of the chest was performed during intravenous contrast administration. CONTRAST:  1019mOMNIPAQUE IOHEXOL 300 MG/ML  SOLN COMPARISON:  PET-CT, 08/02/2018 FINDINGS: Cardiovascular: Interval placement of a right chest port catheter. There is new narrowing of the right brachiocephalic vein about the port catheter tubing (series 9, image 46) with new prominent small internal mammary and chest wall venous collaterals about the chest. Normal heart size. No pericardial effusion. Mediastinum/Nodes: No enlarged mediastinal, hilar, or axillary lymph nodes. Thyroid gland, trachea, and esophagus demonstrate no significant findings. Lungs/Pleura: There are numerous bilateral pulmonary nodules of varying sizes, a number of dominant nodule seen on initial staging examination dated 08/02/2018 not significantly changed in size, however with numerous new small pulmonary nodules throughout the lungs, for example a 5 mm nodule in the left lower lobe (series 11, image 86) and adjacent 2-3 mm nodules of the right lower lobe (series  11, image 113). No pleural effusion or pneumothorax. Upper Abdomen: No acute abnormality. Hepatic steatosis. Unchanged small left adrenal nodule, partially imaged. Musculoskeletal: Status post bilateral mastectomy and implant reconstruction. IMPRESSION: 1. Interval placement of a right chest port catheter. There is new narrowing of the right brachiocephalic vein about the port catheter tubing (series 9, image 46) with new prominent small internal mammary and chest wall venous collaterals about the chest. 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 The superior vena cava itself is widely patent to the cavoatrial junction. A specifically tailored multiphasic CT central venogram may be helpful to further assess functional degree of stenosis and diagnostic/therapeutic interventional  venogram can be considered. 2. There are numerous bilateral pulmonary nodules of varying sizes, a number of dominant nodule seen on initial staging examination dated 08/02/2018 not significantly changed in size, however with numerous new small pulmonary nodules throughout the lungs, for example a 5 mm nodule in the left lower lobe (series 11, image 86) and adjacent 2-3 mm nodules of the right lower lobe (series 11, image 113). Findings are consistent with worsened pulmonary metastatic disease. Electronically Signed   By: Eddie Candle M.D.   On: 12/09/2018 19:24   Mr Cervical Spine Wo Contrast  Result Date: 12/10/2018 CLINICAL DATA:  Onset severe right neck pain 2-3 days ago. History of metastatic breast cancer. EXAM: MRI CERVICAL SPINE WITHOUT CONTRAST TECHNIQUE: Multiplanar, multisequence MR imaging of the cervical spine was performed. No intravenous contrast was administered. COMPARISON:  None. FINDINGS: Alignment: Maintained. Vertebrae: There is a lesion in the right side of the C3 vertebral body measuring approximately 1.3 cm AP x 1.3 cm transverse x 1.2 cm craniocaudal highly suspicious for a metastatic deposit. T2 hyperintensity is also seen in the left transverse processes of C7 and T1 worrisome for metastatic disease. No fracture. Cord: Normal signal throughout. Posterior Fossa, vertebral arteries, paraspinal tissues: Negative. Disc levels: C2-3: Mild disc bulge without stenosis. C3-4: The posterior margin of C3 on the right is mildly expanded by tumor deposit but the central canal and foramina appear open. No disc bulge or protrusion. C4-5: Minimal disc bulge without stenosis. C5-6: There is a shallow disc bulge and some uncovertebral disease. No stenosis. C6-7: Negative. C7-T1: Negative. IMPRESSION: Findings most consistent with metastatic disease in the C3 vertebral body and left transverse processes of C7 and T1. The posterior margin of C3 is mildly expanded but no central canal or foraminal stenosis  is identified. Postcontrast imaging to evaluate for epidural tumor extension is recommended. Whole-body bone scan could be used to evaluate for additional sites of metastatic disease. These results will be called to the ordering clinician or representative by the Radiologist Assistant, and communication documented in the PACS or zVision Dashboard. Electronically Signed   By: Inge Rise M.D.   On: 12/10/2018 05:38     Assessment/Plan: Very pleasant 43 year old presented to the ED last night with neck pain. CT c spine showed a metastatic lesion in the vertebral body of C3 with penetration through the posterior cortex. MRI c-spine reveals the same thing as well as left tp c7 and T1 mets with not obvious central or foraminal stenosis. No surgical intervention needed at this time. This can likely be treated with stereotactic radiosurgery. Will discuss with Dr. [name] for definitive plan of care. She may transition out of the hard aspen collar to a soft collar if she desires. Collar not required though.    Ocie Cornfield [facility] 12/10/2018 9:15 AM

## 2018-12-10 NOTE — Progress Notes (Addendum)
HEMATOLOGY-ONCOLOGY PROGRESS NOTE  SUBJECTIVE: The patient presented to the emergency room over the weekend secondary to neck pain.  A CT of the neck showed a lytic C3 vertebral body metastasis which has broken through the posterior cortex, but no epidural tumor was evident by CT.  CT of the chest showed numerous bilateral pulmonary nodules of varying sizes which had increased in size since the prior CT.  MRI of the cervical spine showed findings consistent with metastatic disease in the C3 vertebral body and left transverse processes of C7 and T1, the posterior margin of C3 is mildly expanded but no central canal or foraminal stenosis is identified.  The patient has been seen by neurosurgery who do not plan for surgical intervention at this time.  They plan to discuss this case with radiation oncology for consideration of stereotactic radiosurgery.  She is in a soft collar at the time of my visit. States this collar feels better. Pain controlled with PRN IV Dilaudid and PRN Oxycodone. She was on 1 mg of IV Dilaudid this am, but it was not fully effective. Now on 1.5 mg which seems to be working better. Pain worsens with movement. She had back pain last week which has now resolved. Denies numbness or weakness in her extremities. Reports facial swelling. She has no other complaints today.  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, estrogen receptor negative ([place])  10/20/2016 Mammogram   Mammogram and ultrasound of the left breast revealed 1.7 cm mass at 4:00 position, 6:30 position 5 x 4 x 4 mm mass, 6:00 position 5 cm nipple 7 x 6 x 11 mm, left axillary lymph node with thickened cortex, T1c N1 stage II a AJCC 8   10/24/2016 Initial Diagnosis   Left breast biopsy 6:30 position 3 cm from nipple: IDC grade 2, DCIS, ER 0%, PR 0%, Ki-67 15% HER-2 positive ratio 2.1; 4:00 position 3 cm from nipple: IDC grade 2, DCIS, ER 0%, PR 0%, Ki-67 35%, HER-2 positive ratio 2.02;  left axillary lymph node biopsy positive   11/04/2016 - 02/17/2017 Neo-Adjuvant Chemotherapy   TCH Perjeta 6 cycles followed by Herceptin + Perjeta maintenance to be completed September 2019   11/30/2016 Genetic Testing   Negative genetic testing on the common hereditary cancer panel.  The Hereditary Gene Panel offered by [facility] includes sequencing and/or deletion duplication testing of the following 47 genes: APC, ATM, AXIN2, BARD1, BMPR1A, BRCA1, BRCA2, BRIP1, CDH1, CDK4, CDKN2A (p14ARF), CDKN2A (p16INK4a), CHEK2, CTNNA1, DICER1, EPCAM (Deletion/duplication testing only), GREM1 (promoter region deletion/duplication testing only), KIT, MEN1, MLH1, MSH2, MSH3, MSH6, MUTYH, NBN, NF1, NHTL1, PALB2, PDGFRA, PMS2, POLD1, POLE, PTEN, RAD50, RAD51C, RAD51D, SDHB, SDHC, SDHD, SMAD4, SMARCA4. STK11, TP53, TSC1, TSC2, and VHL.  The following genes were evaluated for sequence changes only: SDHA and HOXB13 c.251G>A variant only. The report date is November 30, 2016.    03/27/2017 Surgery   Bilateral mastectomies: Left mastectomy: IDC grade 2 0.9 cm, nodes negative, right mastectomy benign, ER 0%, PR 0%, HER-2 positive ratio 2.6   05/08/2017 - 06/09/2017 Radiation Therapy   Adjuvant radiation therapy   10/23/2017 Miscellaneous   Neratinib discontinued after 4 weeks for severe diarrhea   07/25/2018 Relapse/Recurrence   MRI of right elbow showed bone lesion consistent with malignancy. PET scan showed bilateral pulmonary nodules and several lytic bone lesions compatible with metastatic disease. Brain MRI on 08/02/18 showed no evidence of metastatic disease.   08/02/2018 PET scan   Bilateral hypermetabolic lung  nodules, LUL 1.3 cm with SUV 3.88, lingular nodule 1.4 cm SUV 3.7, central lingular nodule 1.2 cm SUV 9.76, right middle lobe nodule 1.5 cm SUV 9.9, lytic bone metastases inferior pubic ramus, sacrum, T12, right 11th rib.   08/08/2018 Procedure   Lung biopsy: metastatic carcinoma, HER-2 negative (0), ER/PR  negative.   08/10/2018 -  Radiation Therapy   Palliative radiatio to the right humerus along the medial condyle   08/24/2018 - 09/05/2018 Radiation Therapy   Palliative radiation to the right 11th rib and right elbow   09/26/2018 - 12/04/2018 Chemotherapy   Carboplatin atezolizumab at [facility] with Dr. Janan Halter on [place] clinical trial stopped because of new T5 metastases (toxicities included myopathy required prednisone, immune mediated thyroiditis), right upper extremity DVT on apixaban   12/13/2018 -  Chemotherapy   The patient had eriBULin mesylate (HALAVEN) 2.65 mg in sodium chloride 0.9 % 100 mL chemo infusion, 1.4 mg/m2 = 2.65 mg, Intravenous,  Once, 0 of 4 cycles  for chemotherapy treatment.    Bone metastases ([place])  08/07/2018 Initial Diagnosis   Bone metastases ([place])   12/13/2018 -  Chemotherapy   The patient had pertuzumab (PERJETA) 420 mg in sodium chloride 0.9 % 250 mL chemo infusion, 420 mg (100 % of original dose 420 mg), Intravenous, Once, 0 of 6 cycles Dose modification: 420 mg (original dose 420 mg, Cycle 1, Reason: Provider Judgment) trastuzumab-anns (KANJINTI) 609 mg in sodium chloride 0.9 % 250 mL chemo infusion, 8 mg/kg = 609 mg (original dose ), Intravenous,  Once, 0 of 6 cycles Dose modification: 8 mg/kg (Cycle 1, Reason: Other (see comments), Comment: insurance requires Kanjinti), 6 mg/kg (Cycle 2, Reason: Other (see comments), Comment: insurance requires Kanjinti) trastuzumab-dkst (OGIVRI) 609 mg in sodium chloride 0.9 % 250 mL chemo infusion, 8 mg/kg = 609 mg, Intravenous,  Once, 0 of 6 cycles  for chemotherapy treatment.       REVIEW OF SYSTEMS:   Constitutional: Denies fevers, chills  Eyes: Denies blurriness of vision Ears, nose, mouth, throat, and face: Denies mucositis or sore throat Respiratory: Denies cough, dyspnea or wheezes Cardiovascular: Denies palpitation, chest discomfort Gastrointestinal:  Denies nausea, heartburn or change in bowel  habits Skin: Denies abnormal skin rashes Lymphatics: Denies new lymphadenopathy or easy bruising Neurological:Denies numbness, tingling or new weaknesses Behavioral/Psych: Mood is stable, no new changes  Extremities: No lower extremity edema All other systems were reviewed with the patient and are negative.  I have reviewed the past medical history, past surgical history, social history and family history with the patient and they are unchanged from previous note.   PHYSICAL EXAMINATION: ECOG PERFORMANCE STATUS: 1 - Symptomatic but completely ambulatory  Vitals:   12/10/18 0400 12/10/18 0810  BP: 119/86 110/83  Pulse: 71 96  Resp:  20  Temp:  98.3 F (36.8 C)  SpO2: 95% 95%   There were no vitals filed for this visit.  Intake/Output from previous day: No intake/output data recorded.  GENERAL:alert, no distress and comfortable SKIN: skin color, texture, turgor are normal, no rashes or significant lesions EYES: normal, Conjunctiva are pink and non-injected, sclera clear OROPHARYNX:no exudate, no erythema and lips, buccal mucosa, and tongue normal  NECK: Neck collar in place LYMPH:  no palpable lymphadenopathy in the cervical, axillary or inguinal LUNGS: clear to auscultation and percussion with normal breathing effort HEART: regular rate & rhythm and no murmurs and no lower extremity edema ABDOMEN:abdomen soft, non-tender and normal bowel sounds Musculoskeletal:no cyanosis of  digits and no clubbing  NEURO: alert & oriented x 3 with fluent speech, no focal motor/sensory deficits  LABORATORY DATA:  I have reviewed the data as listed CMP Latest Ref Rng & Units 12/09/2018 08/20/2018 11/21/2017  Glucose 70 - 99 mg/dL 119(H) 92 80  BUN 6 - 20 mg/dL _0 Creatinine 0.44 - 1.00 mg/dL 0.74 0.97 0.83  Sodium 135 - 145 mmol/L 135 138 139  Potassium 3.5 - 5.1 mmol/L 4.2 3.5 4.3  Chloride 98 - 111 mmol/L 96(L) 101 106  CO2 22 - 32 mmol/L _1 Calcium 8.9 - 10.3 mg/dL 9.3  8.8(L) 9.2  Total Protein 6.5 - 8.1 g/dL 7.0 - 7.4  Total Bilirubin 0.3 - 1.2 mg/dL 0.9 - 0.3  Alkaline Phos 38 - 126 U/L 86 - 99  AST 15 - 41 U/L 31 - 17  ALT 0 - 44 U/L 54(H) - 14    Lab Results  Component Value Date   WBC 10.1 12/09/2018   HGB 10.9 (L) 12/09/2018   HCT 32.9 (L) 12/09/2018   MCV 106.1 (H) 12/09/2018   PLT 232 12/09/2018   NEUTROABS 8.9 (H) 12/09/2018    Ct Soft Tissue Neck W Contrast  Result Date: 12/09/2018 CLINICAL DATA:  43 year old female with metastatic breast cancer and increasing neck pain and swelling. Currently on chemotherapy. Query as feces syndrome. EXAM: CT NECK WITH CONTRAST TECHNIQUE: Multidetector CT imaging of the neck was performed using the standard protocol following the bolus administration of intravenous contrast. CONTRAST:  176m OMNIPAQUE IOHEXOL 300 MG/ML SOLN in conjunction with contrast enhanced imaging of the chest reported separately. COMPARISON:  Chest CT today reported separately. Brain MRI 08/02/2018. FINDINGS: Pharynx and larynx: The glottis is closed. Laryngeal and pharyngeal soft tissue contours are within normal limits. Negative parapharyngeal spaces. Negative retropharyngeal space. Salivary glands: Negative sublingual space. Submandibular glands and parotid glands are within normal limits. Thyroid: Diminutive, negative. Lymph nodes: No cervical lymphadenopathy. Diminutive bilateral cervical nodes. Vascular: The bilateral internal jugular veins are patent. The left IJ is dominant but there is a right IJ approach porta cath in place. The visible bilateral subclavian veins are patent. 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 See also chest CT findings today reported separately. In the neck and at the skull base the major vascular structures are patent. Limited intracranial: Negative. Visualized orbits: Negative. Mastoids and visualized  paranasal sinuses: Clear. Skeleton: Lytic metastasis in the C3 vertebral body is eccentric to the right and breaks through the posterior cortex (series 14, image 49 and sagittal image 48. No obvious epidural extension. There are patent epidural veins overlying the lesion. No other osseous metastasis identified in the neck. Upper chest: Reported separately today. IMPRESSION: 1. Patent major vascular structures in the neck and at the skull base. 2. 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 The See CT Chest today reported separately. 3. Lytic C3 vertebral body metastasis which has broken through the posterior cortex, but no epidural tumor is evident by CT. 4. No other metastatic disease identified in the neck. Electronically Signed   By: HGenevie AnnM.D.   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 CONTRAST:  165m OMNIPAQUE IOHEXOL 300 MG/ML  SOLN COMPARISON:  PET-CT, 08/02/2018 FINDINGS: Cardiovascular: Interval placement of a right chest port catheter. There is new narrowing of the right brachiocephalic vein about the port catheter tubing (series 9, image 46) with new prominent small internal mammary and chest wall venous collaterals about the chest. Normal heart size. No pericardial effusion. Mediastinum/Nodes: No enlarged mediastinal, hilar, or axillary lymph nodes. Thyroid gland, trachea, and esophagus demonstrate no significant findings. Lungs/Pleura: There are numerous bilateral pulmonary nodules of varying sizes, a number of dominant nodule seen on initial staging examination dated 08/02/2018 not significantly changed in size, however with numerous new small pulmonary nodules  throughout the lungs, for example a 5 mm nodule in the left lower lobe (series 11, image 86) and adjacent 2-3 mm nodules of the right lower lobe (series 11, image 113). No pleural effusion or pneumothorax. Upper Abdomen: No acute abnormality. Hepatic steatosis. Unchanged small left adrenal nodule, partially imaged. Musculoskeletal: Status post bilateral mastectomy and implant reconstruction. IMPRESSION: 1. Interval placement of a right chest port catheter. There is new narrowing of the right brachiocephalic vein about the port catheter tubing (series 9, image 46) with new prominent small internal mammary and chest wall venous collaterals about the chest. 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 The superior vena cava itself is widely patent to the cavoatrial junction. A specifically tailored multiphasic CT central venogram may be helpful to further assess functional degree of stenosis and diagnostic/therapeutic interventional venogram can be considered. 2. There are numerous bilateral pulmonary nodules of varying sizes, a number of dominant nodule seen on initial staging examination dated 08/02/2018 not significantly changed in size, however with numerous new small pulmonary nodules throughout the lungs, for example a 5 mm nodule in the left lower lobe (series 11, image 86) and adjacent 2-3 mm nodules of the right lower lobe (series 11, image 113). Findings are consistent with worsened pulmonary metastatic disease. Electronically Signed   By: AEddie CandleM.D.   On: 12/09/2018 19:24   Mr Cervical Spine Wo Contrast  Result Date: 12/10/2018 CLINICAL DATA:  Onset severe right neck pain 2-3 days ago. History of metastatic breast cancer. EXAM: MRI CERVICAL SPINE WITHOUT CONTRAST TECHNIQUE: Multiplanar, multisequence MR imaging of the cervical spine was performed. No intravenous contrast was administered. COMPARISON:  None. FINDINGS: Alignment: Maintained. Vertebrae: There is a lesion in  the right side of the C3 vertebral body measuring approximately 1.3 cm AP x 1.3 cm transverse x 1.2 cm craniocaudal highly suspicious for a metastatic deposit. T2 hyperintensity is also seen in the left transverse processes of C7 and T1 worrisome for metastatic disease. No fracture. Cord: Normal signal throughout. Posterior Fossa, vertebral arteries, paraspinal tissues: Negative. Disc levels: C2-3: Mild disc bulge without stenosis. C3-4: The posterior margin of C3 on the right is mildly expanded by tumor deposit but the central canal and foramina appear open. No disc bulge or protrusion. C4-5: Minimal disc bulge without stenosis. C5-6: There is a shallow disc bulge and some uncovertebral disease. No stenosis. C6-7: Negative. C7-T1: Negative. IMPRESSION: Findings most consistent with metastatic disease in the C3 vertebral body and left transverse processes of C7 and T1. The posterior margin of C3 is mildly expanded but no central canal or foraminal stenosis is identified. Postcontrast imaging to evaluate for epidural tumor extension is recommended. Whole-body bone scan could be used to evaluate for additional sites of metastatic disease. These  results will be called to the ordering clinician or representative by the Radiologist Assistant, and communication documented in the PACS or zVision Dashboard. Electronically Signed   By: Inge Rise M.D.   On: 12/10/2018 05:38    ASSESSMENT AND PLAN: 1.  Metastatic breast cancer 2.  Neck pain secondary to C3 bone metastasis 3.  History of provoked right upper extremity DVT 4.  Anemia  -The patient has been evaluated by Neurosurgery. No plans for surgical intervention at this time. Neurosurgery recommends evaluation by Radiation Oncology for consideration of stereotactic radiosurgery. I have placed a formal consult.  -Continue current pain medications. May need to increase the dose of Dilaudid if current dose ineffective.  -Continue bowel regimen.   -Scheduled  to begin [place], Herceptin. Perjeta on 12/14/2018. May need to delay due to hospitalization.  -Continue Eliquis for DVT.  -Anemia is mild and likely due to recent chemo and underlying malignancy. Continue to monitor.    LOS: 0 days   Mikey Bussing, DNP, AGPCNP-BC, AOCNP 12/10/18  Addendum: Attending Note  I personally saw the patient, reviewed the chart and examined the patient. The plan of care was discussed with the patient and the admitting team. I agree with the assessment and plan as documented above. Thank you very much for the consultation. Metastatic breast cancer: Severe neck pain due to metastatic lesion at C3 currently on Dilaudid which appears to be working for her at 2 mg every 3 hours as needed. I called and discussed the case with Dr. Tammi Klippel who recommended that the patient be transferred to [facility] long hospital to consider palliative radiation for urgent pain relief.  Patient is agreeable and is willing to be treated with radiation. Currently on oral prednisone.  We will have to postpone her chemotherapy until radiation is complete. I will follow her while she is in the hospital. Thank you very much for the excellent care

## 2018-12-10 NOTE — Progress Notes (Signed)
Report given to Ailene Ravel, Therapist, sports at [facility]. Called [facility] for transport.

## 2018-12-10 NOTE — Progress Notes (Signed)
Orthopedic Tech Progress Note Patient Details:  Carleena Neupert [facility] 10-08-1975 [id number]  Ortho Devices Type of Ortho Device: Soft collar Ortho Device/Splint Location: neck Ortho Device/Splint Interventions: Ordered, Application   Post Interventions Patient Tolerated: Well Instructions Provided: Care of device   Braulio Bosch 12/10/2018, 1:20 PM

## 2018-12-11 ENCOUNTER — Inpatient Hospital Stay (HOSPITAL_COMMUNITY): Payer: 59

## 2018-12-11 ENCOUNTER — Ambulatory Visit
Admission: RE | Admit: 2018-12-11 | Discharge: 2018-12-11 | Disposition: A | Discharge status: Home / Self Care | Payer: 59 | Source: Ambulatory Visit | Attending: Radiation Oncology | Admitting: Radiation Oncology

## 2018-12-11 DIAGNOSIS — C50919 Malignant neoplasm of unspecified site of unspecified female breast: Secondary | ICD-10-CM

## 2018-12-11 DIAGNOSIS — C7951 Secondary malignant neoplasm of bone: Secondary | ICD-10-CM

## 2018-12-11 DIAGNOSIS — C50512 Malignant neoplasm of lower-outer quadrant of left female breast: Secondary | ICD-10-CM

## 2018-12-11 DIAGNOSIS — S72001A Fracture of unspecified part of neck of right femur, initial encounter for closed fracture: Secondary | ICD-10-CM

## 2018-12-11 DIAGNOSIS — I871 Compression of vein: Secondary | ICD-10-CM

## 2018-12-11 DIAGNOSIS — Z171 Estrogen receptor negative status [ER-]: Secondary | ICD-10-CM

## 2018-12-11 MED ORDER — SENNOSIDES-DOCUSATE SODIUM 8.6-50 MG PO TABS
1.0000 | ORAL_TABLET | Freq: Two times a day (BID) | ORAL | Status: DC
  Administered 2018-12-11 – 2018-12-14 (×7): 1 via ORAL
  Filled 2018-12-11 (×7): qty 1

## 2018-12-11 MED ORDER — GADOBUTROL 1 MMOL/ML IV SOLN
8.0000 mL | Freq: Once | INTRAVENOUS | Status: AC | PRN
  Administered 2018-12-11: 8 mL via INTRAVENOUS

## 2018-12-11 MED ORDER — TRAZODONE HCL 50 MG PO TABS
50.0000 mg | ORAL_TABLET | Freq: Every evening | ORAL | Status: DC | PRN
  Administered 2018-12-11 – 2018-12-13 (×3): 50 mg via ORAL
  Filled 2018-12-11 (×3): qty 1

## 2018-12-11 NOTE — Progress Notes (Signed)
Patient ID: Laura Anthony, female   DOB: 28-Jul-1975, 43 y.o.   MRN: [id number] Stopped by to see her. Getting an MRI.  I will follow up after discussing with vas surgery

## 2018-12-11 NOTE — Progress Notes (Signed)
[facility]                                                                              Patient Demographics  Laura Anthony, is a 43 y.o. female, DOB - 04/26/1975, PJK:[id number]  Admit date - 12/09/2018   Admitting Physician Shela Leff, MD  Outpatient Primary MD for the patient is [name], MD  Outpatient specialists:   LOS - 1  days   Medical records reviewed and are as summarized below:    No chief complaint on file.      Brief summary   Patient is a 43 year old female with history of metastatic breast CA, provoked DVT of the right upper extremity on Eliquis, hyperlipidemia, right shoulder rotator cuff impingement syndrome, presented with neck pain.  Patient reported 1 day history of severe right-sided neck pain, no fall or any injuries to her neck.  Patient also reported her face appears swollen and little red.  No focal weakness or paresthesias of the extremities.  No dyspnea or dysphagia.  Patient has breast CA with metastasis to several bones, enrolled in the clinical trial at [facility].  Recent scans done at [facility] revealed that there was progression of the disease. CT soft tissue neck showed lytic C3 vertebral body metastasis, broken through the posterior cortex but no epidural tumor evident on CT.  CT chest showed new narrowing of the right brachiocephalic vein, suggestive of right brachiocephalic vein stenosis, SVC widely patent to cavoatrial junction  Patient was admitted at [facility], was evaluated by neurosurgery, placed on Aspen collar.  MRI C-spine revealed metastatic disease in the C3 vertebral body, left transverse process of C7 and T1 with no obvious central or foraminal stenosis, no surgical intervention at this time and recommended stereotactic radiosurgery neurosurgery.  Patient also complained of right groin pain on ambulation.  Right hip and pelvic x-ray showed linear lucency of the right inferior pubic rami may indicate  nondisplaced fracture, no fracture or dislocation of the right hip.    Patient was also seen by oncology, radiation oncology, transferred to [facility] for simulation and radiation therapy.  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  Recommended XRT.  Soft C-collar okay. -Radiation oncology and oncology consulted -Currently pain controlled, will continue current regimen, added bowel regimen  Brachiocephalic vein stenosis -CT chest showed new narrowing of the right brachiocephalic vein about the port catheter tubing with new prominent small internal mammary and chest wall venous collaterals about the chest suggestive of right brachiocephalic vein stenosis, SVC itself is widely patent. -Dr. Eliseo Squires discussed with Dr. Vernard Gambles, recommended no intervention at this time.  There is stenosis but left IJ is patent and collateral venous drainage will be slow but suffice, no clot burden to be dealt with at this time.  Metastatic breast CA with osseous mets, lungs, stage IV, ER/PR negative, HER-2/neu amplified -Follows Dr. Lindi Adie  and involved in clinical trial at [facility].  Per last [facility] oncology patient has progression of the disease. -Oncology following, seen by Dr. Lindi Adie on 11/9, scheduled to begin [place], Herceptin, [place] on 11/13, may need to delay due to hospitalization  Right pelvic/groin pain -X-ray of the right hip and pelvis shows linear lucency of the right inferior pubic rami may indicate nondisplaced fracture, no fracture or dislocation of the right hip -Discussed with Dr. Lucia Gaskins, orthopedics, reviewed the films, recommended CT of the pelvis.  No acute orthopedic interventions, weightbearing as tolerated -May need palliative XRT  to the area if pathological fracture  History of provoked RUE DVT in the setting of metastatic breast CA -Continue Eliquis for anticoagulation  Macrocytic anemia, mild to due to recent chemo and underlying malignancy -H&H currently stable  Depression Continue Prozac, mirtazapine  Hypothyroidism Continue Synthroid   Code Status: Full CODE STATUS DVT Prophylaxis: Eliquis Family Communication: Discussed all imaging results, lab results, explained to the patient    Disposition Plan: Plan per oncology, radiation oncology  Time Spent in minutes 45 minutes  Procedures:  MRI C-spine, CT chest  Consultants:   Oncology, Dr. Lindi Adie Neurosurgery Radiation oncology Orthopedics via phone consultation, Dr. Lucia Gaskins  Antimicrobials:   Anti-infectives (From admission, onward)   None          Medications  Scheduled Meds:  acetaminophen  1,000 mg Oral Q8H   apixaban  5 mg Oral BID   FLUoxetine  40 mg Oral Daily   gabapentin  300 mg Oral QPM   levothyroxine  88 mcg Oral QAC breakfast   lisdexamfetamine  30 mg Oral Q breakfast   mirtazapine  15 mg Oral QHS   morphine  30 mg Oral Q12H   polyethylene glycol  17 g Oral Daily   predniSONE  30 mg Oral Q breakfast   senna-docusate  1 tablet Oral BID   Continuous Infusions: PRN Meds:.HYDROmorphone (DILAUDID) injection, [START ON 12/13/2018] influenza vac split quadrivalent PF, LORazepam, ondansetron, oxyCODONE, prochlorperazine      Subjective:   Laura Anthony was seen and examined today.  States pain is controlled with the current regimen.  Had a small BM yesterday, still somewhat constipated.  No focal weakness, numbness or tingling in the shoulder or arms.  Right groin pain only on ambulation otherwise no focal weakness, urinary or bowel incontinence.  No dizziness, chest pain or shortness of breath, abdominal pain.  No acute issues overnight.  Objective:   Vitals:   12/10/18 2026 12/10/18 2122 12/11/18  0200 12/11/18 0621  BP: 124/83 131/85  (!) 127/95  Pulse: 71 85  75  Resp:  18  16  Temp: 98.4 F (36.9 C) 98.2 F (36.8 C)  98.2 F (36.8 C)  TempSrc: Oral Oral  Oral  SpO2:  94%  97%  Weight:   77 kg   Height:   5' 7"  (1.702 m)    No intake or output data in the 24 hours ending 12/11/18 1041   Wt Readings from Last 3 Encounters:  12/11/18 77 kg  12/06/18 77 kg  08/23/18 70.7 kg     Exam  General: Alert and oriented x 3, NAD, pleasant  Eyes:   HEENT: C-collar in place  Cardiovascular: S1 S2 auscultated, no murmurs, RRR  Respiratory: Clear to auscultation bilaterally, no wheezing.  Right sided port+  Gastrointestinal: Soft, nontender, nondistended, + bowel sounds  Ext: no pedal edema bilaterally  Neuro: Strength 5/5 upper and lower extremities bilaterally  Musculoskeletal: No digital cyanosis, clubbing  Skin: No rashes  Psych: Normal affect and demeanor, alert and oriented x3    Data Reviewed:  I have personally reviewed following labs and imaging studies  Micro Results Recent Results (from the past 240 hour(s))  SARS CORONAVIRUS 2 (TAT 6-24 HRS) Nasopharyngeal Nasopharyngeal Swab     Status: None   Collection Time: 12/09/18  8:24 PM   Specimen: Nasopharyngeal Swab  Result Value Ref Range Status   SARS Coronavirus 2 NEGATIVE NEGATIVE Final    Comment: (NOTE) SARS-CoV-2 target nucleic acids are NOT DETECTED. The SARS-CoV-2 RNA is generally detectable in upper and lower respiratory specimens during the acute phase of infection. Negative results do not preclude SARS-CoV-2 infection, do not rule out co-infections with other pathogens, and should not be used as the sole basis for treatment or other patient management decisions. Negative results must be combined with clinical observations, patient history, and epidemiological information. The expected result is Negative. Fact Sheet for Patients: SugarRoll.be Fact Sheet for  Healthcare Providers: https://www.woods-mathews.com/ This test is not yet approved or cleared by the [place] FDA and  has been authorized for detection and/or diagnosis of SARS-CoV-2 by FDA under an Emergency Use Authorization (EUA). This EUA will remain  in effect (meaning this test can be used) for the duration of the COVID-19 declaration under Section 56 4(b)(1) of the Act, 21 U.S.C. section 360bbb-3(b)(1), unless the authorization is terminated or revoked sooner. Performed at [facility], [place] [address], [place] [address]     Radiology Reports Ct Soft Tissue Neck W Contrast  Result Date: 12/09/2018 CLINICAL DATA:  43 year old female with metastatic breast cancer and increasing neck pain and swelling. Currently on chemotherapy. Query as feces syndrome. EXAM: CT NECK WITH CONTRAST TECHNIQUE: Multidetector CT imaging of the neck was performed using the standard protocol following the bolus administration of intravenous contrast. CONTRAST:  158m OMNIPAQUE IOHEXOL 300 MG/ML SOLN in conjunction with contrast enhanced imaging of the chest reported separately. COMPARISON:  Chest CT today reported separately. Brain MRI 08/02/2018. FINDINGS: Pharynx and larynx: The glottis is closed. Laryngeal and pharyngeal soft tissue contours are within normal limits. Negative parapharyngeal spaces. Negative retropharyngeal space. Salivary glands: Negative sublingual space. Submandibular glands and parotid glands are within normal limits. Thyroid: Diminutive, negative. Lymph nodes: No cervical lymphadenopathy. Diminutive bilateral cervical nodes. Vascular: The bilateral internal jugular veins are patent. The left IJ is dominant but there is a right IJ approach porta cath in place. The visible bilateral subclavian veins are patent. 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 See also  chest CT findings today reported separately. In the neck and at the skull base the major vascular structures are patent. Limited intracranial: Negative. Visualized orbits: Negative. Mastoids and visualized paranasal sinuses: Clear. Skeleton: Lytic metastasis in the C3 vertebral body is eccentric to the right and breaks through the posterior cortex (series 14, image 49 and sagittal image 48. No obvious epidural extension. There are patent epidural veins overlying the lesion. No other osseous metastasis identified in the neck. Upper chest: Reported separately today. IMPRESSION: 1. Patent major vascular structures in the neck and at the skull base. 2. 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 The See CT Chest today reported separately. 3. Lytic C3 vertebral body metastasis which has broken  through the posterior cortex, but no epidural tumor is evident by CT. 4. No other metastatic disease identified in the neck. Electronically Signed   By: Genevie Ann M.D.   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 CONTRAST:  112m OMNIPAQUE IOHEXOL 300 MG/ML  SOLN COMPARISON:  PET-CT, 08/02/2018 FINDINGS: Cardiovascular: Interval placement of a right chest port catheter. There is new narrowing of the right brachiocephalic vein about the port catheter tubing (series 9, image 46) with new prominent small internal mammary and chest wall venous collaterals about the chest. Normal heart size. No pericardial effusion. Mediastinum/Nodes: No enlarged mediastinal, hilar, or axillary lymph nodes. Thyroid gland, trachea, and esophagus demonstrate no significant findings. Lungs/Pleura: There are numerous  bilateral pulmonary nodules of varying sizes, a number of dominant nodule seen on initial staging examination dated 08/02/2018 not significantly changed in size, however with numerous new small pulmonary nodules throughout the lungs, for example a 5 mm nodule in the left lower lobe (series 11, image 86) and adjacent 2-3 mm nodules of the right lower lobe (series 11, image 113). No pleural effusion or pneumothorax. Upper Abdomen: No acute abnormality. Hepatic steatosis. Unchanged small left adrenal nodule, partially imaged. Musculoskeletal: Status post bilateral mastectomy and implant reconstruction. IMPRESSION: 1. Interval placement of a right chest port catheter. There is new narrowing of the right brachiocephalic vein about the port catheter tubing (series 9, image 46) with new prominent small internal mammary and chest wall venous collaterals about the chest. 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 The superior vena cava itself is widely patent to the cavoatrial junction. A specifically tailored multiphasic CT central venogram may be helpful to further assess functional degree of stenosis and diagnostic/therapeutic interventional venogram can be considered. 2. There are numerous bilateral pulmonary nodules of varying sizes, a number of dominant nodule seen on initial staging examination dated 08/02/2018 not significantly changed in size, however with numerous new small pulmonary nodules throughout the lungs, for example a 5 mm nodule in the left lower lobe (series 11, image 86) and adjacent 2-3 mm nodules of the right lower lobe (series 11, image 113). Findings are consistent with worsened pulmonary metastatic disease. Electronically Signed   By: AEddie CandleM.D.   On: 12/09/2018 19:24   Mr Cervical Spine Wo Contrast  Result Date: 12/10/2018 CLINICAL DATA:  Onset severe right neck pain 2-3 days ago. History of metastatic breast cancer. EXAM: MRI CERVICAL SPINE WITHOUT CONTRAST  TECHNIQUE: Multiplanar, multisequence MR imaging of the cervical spine was performed. No intravenous contrast was administered. COMPARISON:  None. FINDINGS: Alignment: Maintained. Vertebrae: There is a lesion in the right side of the C3 vertebral body measuring approximately 1.3 cm AP x 1.3 cm transverse x 1.2 cm craniocaudal highly suspicious for a metastatic deposit. T2 hyperintensity is also seen in the left transverse processes of C7 and T1 worrisome for metastatic disease. No fracture. Cord: Normal signal throughout. Posterior Fossa, vertebral arteries, paraspinal tissues: Negative. Disc levels: C2-3: Mild disc bulge without stenosis. C3-4: The posterior margin of C3 on the right is mildly expanded by tumor deposit but the central canal and foramina appear open. No disc bulge or protrusion. C4-5: Minimal disc bulge without stenosis. C5-6: There is a shallow disc bulge and some uncovertebral disease. No stenosis. C6-7: Negative. C7-T1:  Negative. IMPRESSION: Findings most consistent with metastatic disease in the C3 vertebral body and left transverse processes of C7 and T1. The posterior margin of C3 is mildly expanded but no central canal or foraminal stenosis is identified. Postcontrast imaging to evaluate for epidural tumor extension is recommended. Whole-body bone scan could be used to evaluate for additional sites of metastatic disease. These results will be called to the ordering clinician or representative by the Radiologist Assistant, and communication documented in the PACS or zVision Dashboard. Electronically Signed   By: Inge Rise M.D.   On: 12/10/2018 05:38   Dg Hip Unilat With Pelvis 2-3 Views Right  Result Date: 12/10/2018 CLINICAL DATA:  Right hip pain EXAM: DG HIP (WITH OR WITHOUT PELVIS) 2-3V RIGHT COMPARISON:  None. FINDINGS: No fracture or dislocation at the right hip. There is a linear lucency of the right inferior pubic ramus without displacement. The visualized pelvic and left hip  osseous structures are normal otherwise. IMPRESSION: Linear lucency of the right inferior pubic ramus may indicate a nondisplaced fracture (CT would be more conclusive). No fracture or dislocation of the right hip. Electronically Signed   By: Ulyses Jarred M.D.   On: 12/10/2018 19:27    Lab Data:  CBC: Recent Labs  Lab 12/09/18 1727  WBC 10.1  NEUTROABS 8.9*  HGB 10.9*  HCT 32.9*  MCV 106.1*  PLT 407   Basic Metabolic Panel: Recent Labs  Lab 12/09/18 1727  NA 135  K 4.2  CL 96*  CO2 29  GLUCOSE 119*  BUN 16  CREATININE 0.74  CALCIUM 9.3   GFR: Estimated Creatinine Clearance: 98.1 mL/min (by C-G formula based on SCr of 0.74 mg/dL). Liver Function Tests: Recent Labs  Lab 12/09/18 1727  AST 31  ALT 54*  ALKPHOS 86  BILITOT 0.9  PROT 7.0  ALBUMIN 3.4*   No results for input(s): LIPASE, AMYLASE in the last 168 hours. No results for input(s): AMMONIA in the last 168 hours. Coagulation Profile: No results for input(s): INR, PROTIME in the last 168 hours. Cardiac Enzymes: No results for input(s): CKTOTAL, CKMB, CKMBINDEX, TROPONINI in the last 168 hours. BNP (last 3 results) No results for input(s): PROBNP in the last 8760 hours. HbA1C: No results for input(s): HGBA1C in the last 72 hours. CBG: No results for input(s): GLUCAP in the last 168 hours. Lipid Profile: No results for input(s): CHOL, HDL, LDLCALC, TRIG, CHOLHDL, LDLDIRECT in the last 72 hours. Thyroid Function Tests: No results for input(s): TSH, T4TOTAL, FREET4, T3FREE, THYROIDAB in the last 72 hours. Anemia Panel: No results for input(s): VITAMINB12, FOLATE, FERRITIN, TIBC, IRON, RETICCTPCT in the last 72 hours. Urine analysis: No results found for: COLORURINE, APPEARANCEUR, LABSPEC, PHURINE, GLUCOSEU, HGBUR, BILIRUBINUR, KETONESUR, PROTEINUR, UROBILINOGEN, NITRITE, LEUKOCYTESUR   Arlene Genova M.D. [facility] 12/11/2018, 10:41 AM  Pager: [phone] Between 7am to 7pm - call Pager -  [phone]  After 7pm go to [email or web address] - password [email or web address]  Call night coverage person covering after 7pm

## 2018-12-11 NOTE — Progress Notes (Signed)
I called the patient back to review her pelvic CT imaging and left a message asking her to call me back but that Dr. [name] would recommend a 3 week palliative radiotherapy course to the pelvis and sacrum.     Carola Rhine, PAC

## 2018-12-11 NOTE — Consult Note (Addendum)
Radiation Oncology         [phone] ________________________________  Name: Laura Anthony        MRN: [id number]  Date of Service: 12/11/2018  DOB: 03-22-1975  GY:BWLSLH, [name], MD  No ref. provider found     REFERRING PHYSICIAN: Dr. Tana Coast  DIAGNOSIS: The primary encounter diagnosis was Metastatic cancer to spine [facility]). A diagnosis of Hip fracture ([employer]) was also pertinent to this visit.   HISTORY OF PRESENT ILLNESS: Laura Anthony is a 43 y.o. female seen at the request of Dr. Tana Coast and the inpatient hospitalist team for concerns of a cervical spine metastasis. The patient is well known to our service who completed a course of palliative radiotherapy to the right 11th rib and right elbow in the summer. 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 She was recently undergoing treatment at [facility] on a clinical trial. Unfortunately she did have some progressive disease and decided to return to [facility] with Dr. Lindi Adie to begin a different systemic therapy. She is scheduled for Herceptin/Perjecta/[place] which was originally going to start this week. She developed progressive neck pain at the end of last week and through the weekend this became unbearable despite heat, oxycodone, and in the midst of this developed facial swelling. 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 Her chest CT also showed numerous bilateral pulmonary nodules the dominant unchanged in size but the others subcentimeter. She did have narrowing of the right brachiocephalic vein about the PAC site with prominent internal mammary and chest wall collateral vessels. No  adenopathy was noted and the SVC was patent. She had an MRI of the cervical spine without contrast revealing a 1.3 x 1.3 x 1.2 cm lesion in the right aspect of the vertebral body and hyperintensity seen at the transverse processes on the left in both C7 and T1. No evidence of compromise to the cords was noted. She also has right groin pain and plain films of the hip and pelvis yesterday also showed linear lucency of the right inferior pubic ramus, possibly consistent with a fracture. Orthopedics recommended a CT of the pelvis but did not anticipate a role for acute surgical intervention. She is contacted today to discuss options of radiotherapy.   PREVIOUS RADIATION THERAPY: Yes   08/22/2018-09/05/2018: 30 Gy in 10 fractions to the right 11th rib  08/08/2018-08/21/2018: 30 Gy in 10 fractions to the right elbow  05/08/2017 - 06/21/2017: The patient initially received a dose of 50.4 Gy in 28 fractions to the leftchest wall,supraclavicular region, and posterior axillary region. This was delivered using a 3-D conformal, 4 field technique. The patient then received a boost to the mastectomy scar. This delivered an additional 10 Gy in 5 fractions using an en face electron field. The total dose was 60.4 Gy.   PAST MEDICAL HISTORY:  Past Medical History:  Diagnosis Date   ADD (attention deficit disorder)    Anemia    Anxiety    Breast cancer, left breast ([place])    S/P mastectomy 03/27/2017   DVT (deep venous thrombosis) ([place]) 2017   calf left - probably due to [facility] pills-took eliquis x3  mos, nonthing now   High cholesterol    Impingement syndrome of right shoulder 07/2013   Migraine    "usually 1/month" (03/28/2017)   PONV (postoperative nausea and vomiting)    Right bicipital tenosynovitis 07/2013   Rotator cuff impingement syndrome of right shoulder 07/12/2013   Seizures ([place])    x 1 as a child - was never on anticonvulsants (03/28/2017)       PAST SURGICAL HISTORY: Past Surgical  History:  Procedure Laterality Date   ADENOIDECTOMY  1981   ANKLE ARTHROSCOPY Right    BREAST BIOPSY Left 10/2016   KNEE ARTHROSCOPY Right    KNEE ARTHROSCOPY W/ ACL RECONSTRUCTION Left    LIPOSUCTION WITH LIPOFILLING Bilateral 01/05/2018   Procedure: LIPOFILLING FROM ABDOMEN TO BILATERAL CHEST;  Surgeon: Irene Limbo, MD;  Location: [place];  Service: Plastics;  Laterality: Bilateral;   MASTECTOMY Left 03/27/2017   NIPPLE SPARING MASTECTOMY WITH RADIOACTIVE SEED TARGETED LYMPH NODE EXCISION AND LEFT AXILLARY SENTINEL LYMPH NODE BIOPSY   MASTECTOMY Right 03/27/2017   RIGHT PROPHYLACTIC NIPPLE SPARING MASTECTOMY   NIPPLE SPARING MASTECTOMY Right 03/27/2017   Procedure: RIGHT PROPHYLACTIC NIPPLE SPARING MASTECTOMY;  Surgeon: Rolm Bookbinder, MD;  Location: [place];  Service: General;  Laterality: Right;   PORT-A-CATH REMOVAL Right 01/05/2018   Procedure: REMOVAL RIGHT CHEST PORT;  Surgeon: Irene Limbo, MD;  Location: [place];  Service: Plastics;  Laterality: Right;   PORTACATH PLACEMENT N/A 11/01/2016   Procedure: INSERTION PORT-A-CATH WITH [place];  Surgeon: Rolm Bookbinder, MD;  Location: [place];  Service: General;  Laterality: N/A;   PORTACATH PLACEMENT N/A 08/23/2018   Procedure: INSERTION PORT-A-CATH WITH ULTRASOUND;  Surgeon: Rolm Bookbinder, MD;  Location: [place];  Service: General;  Laterality: N/A;   RADIOACTIVE SEED GUIDED AXILLARY SENTINEL LYMPH NODE Left 03/27/2017   Procedure: LEFT NIPPLE SPARING MASTECTOMY WITH RADIOACTIVE SEED TARGETED LYMPH NODE EXCISION AND LEFT AXILLARY SENTINEL LYMPH NODE BIOPSY;  Surgeon: Rolm Bookbinder, MD;  Location: [place];  Service: General;  Laterality: Left;  REQUESTS RNFA   RECONSTRUCTION BREAST IMMEDIATE / DELAYED W/ TISSUE EXPANDER Bilateral 03/27/2017   BILATERAL BREAST RECONSTRUCTION WITH PLACEMENT OF TISSUE EXPANDER AND ALLODERM     REMOVAL OF BILATERAL TISSUE EXPANDERS WITH PLACEMENT OF BILATERAL BREAST IMPLANTS Bilateral 01/05/2018   Procedure: REMOVAL OF BILATERAL TISSUE EXPANDERS WITH PLACEMENT OF BILATERAL BREAST IMPLANTS;  Surgeon: Irene Limbo, MD;  Location: [place];  Service: Plastics;  Laterality: Bilateral;   SEPTOPLASTY WITH ETHMOIDECTOMY, AND MAXILLARY ANTROSTOMY  10/29/2010   bilat. max. antrostomy with left max. stripping; left ant. ethmoidectomy; right total ethmoidectomy; sphenoidotomy   SHOULDER ARTHROSCOPY WITH SUBACROMIAL DECOMPRESSION AND BICEP TENDON REPAIR Right 07/12/2013   Procedure: RIGHT SHOULDER ARTHROSCOPY DEBRIDEMENT EXTENSIVE DECOMPRESSION SUBACROMIAL PARTIAL ACROMIOPLASTY;  Surgeon: Johnny Bridge, MD;  Location: [place];  Service: Orthopedics;  Laterality: Right;   WRIST ARTHROSCOPY  01/17/2012   Procedure: ARTHROSCOPY WRIST; right wrist Surgeon: Tennis Must, MD;  Location: [place];  Service: Orthopedics;  Laterality: Right;  RIGHT WRIST ARTHROSCOPY WITH TRIANGULAR FIBROCARTILAGE COMPLEX REPAIR AND DEBRIDEMENT      FAMILY HISTORY:  Family History  Problem Relation Age of Onset   Breast cancer Mother 23       triple negative   Leukemia Father    Lung cancer Father    Heart attack Maternal Uncle    Prostate cancer Paternal Uncle    COPD Paternal Grandmother  Heart disease Paternal Grandfather    Prostate cancer Paternal Uncle    Leukemia Cousin      SOCIAL HISTORY:  reports that she has never smoked. She has never used smokeless tobacco. She reports current alcohol use. She reports that she does not use drugs. The patient is married and lives in [place]. She has teenage children.   ALLERGIES: Sumatriptan, Statins, and Tape   MEDICATIONS:  Current Facility-Administered Medications  Medication Dose Route Frequency Provider Last Rate Last Dose   acetaminophen (TYLENOL) tablet 1,000 mg  1,000 mg Oral  Q8H Shela Leff, MD   1,000 mg at 12/10/18 2151   apixaban (ELIQUIS) tablet 5 mg  5 mg Oral BID Shela Leff, MD   5 mg at 12/10/18 2152   FLUoxetine (PROZAC) capsule 40 mg  40 mg Oral Daily Shela Leff, MD   40 mg at 12/10/18 4081   gabapentin (NEURONTIN) capsule 300 mg  300 mg Oral QPM Shela Leff, MD   300 mg at 12/10/18 2150   HYDROmorphone (DILAUDID) injection 2 mg  2 mg Intravenous Q3H PRN Eulogio Bear U, DO   2 mg at 12/11/18 0254   [START ON 12/13/2018] influenza vac split quadrivalent PF (FLUARIX) injection 0.5 mL  0.5 mL Intramuscular Prior to discharge Vann, Jessica U, DO       levothyroxine (SYNTHROID) tablet 88 mcg  88 mcg Oral QAC breakfast Shela Leff, MD   88 mcg at 12/10/18 4481   lisdexamfetamine (VYVANSE) capsule 30 mg  30 mg Oral Q breakfast Shela Leff, MD   30 mg at 12/10/18 8563   LORazepam (ATIVAN) tablet 1 mg  1 mg Oral Q12H PRN Radene Gunning, NP       mirtazapine (REMERON) tablet 15 mg  15 mg Oral QHS Shela Leff, MD   15 mg at 12/10/18 2152   morphine (MS CONTIN) 12 hr tablet 30 mg  30 mg Oral Q12H Shela Leff, MD   30 mg at 12/10/18 2151   ondansetron (ZOFRAN) tablet 8 mg  8 mg Oral BID PRN Shela Leff, MD       oxyCODONE (Oxy IR/ROXICODONE) immediate release tablet 10 mg  10 mg Oral Q4H PRN Vann, Jessica U, DO       polyethylene glycol (MIRALAX / GLYCOLAX) packet 17 g  17 g Oral Daily Black, Karen M, NP   17 g at 12/10/18 1137   predniSONE (DELTASONE) tablet 30 mg  30 mg Oral Q breakfast Radene Gunning, NP   30 mg at 12/10/18 1137   prochlorperazine (COMPAZINE) tablet 10 mg  10 mg Oral Q6H PRN Shela Leff, MD       senna-docusate (Senokot-S) tablet 1 tablet  1 tablet Oral BID Radene Gunning, NP   1 tablet at 12/10/18 2151     REVIEW OF SYSTEMS: On review of systems, the patient reports that she  is doing okay with pain relief. She describes a deep aching pain in her upper neck, that  at its worst, keeps her from being able to elevate her neck off the bed or pillow. She reports her right arm and facial swelling have improved, the arm most improved compared to the face. With her neck pain, states the dilaudid is better than her oral oxycodone, but is worried about going home without her pain controlled. She also states in the past few weeks she's had a slow onset of right groin pain that is sometimes worse with weight bearing. She denies any chest pain,  shortness of breath, cough, fevers, chills, night sweats, unintended weight changes. She denies any bowel or bladder disturbances, and denies abdominal pain, nausea or vomiting. She denies any new musculoskeletal or joint aches or pains. A complete review of systems is obtained and is otherwise negative.     PHYSICAL EXAM:  Wt Readings from Last 3 Encounters:  12/11/18 169 lb 11.2 oz (77 kg)  12/06/18 169 lb 11.2 oz (77 kg)  08/23/18 155 lb 13.8 oz (70.7 kg)   Temp Readings from Last 3 Encounters:  12/10/18 98.2 F (36.8 C) (Oral)  12/09/18 98.6 F (37 C) (Oral)  12/06/18 98.3 F (36.8 C) (Temporal)   BP Readings from Last 3 Encounters:  12/10/18 131/85  12/09/18 134/88  12/06/18 117/82   Pulse Readings from Last 3 Encounters:  12/10/18 85  12/09/18 86  12/06/18 70   Pain Assessment Pain Score: Asleep/10  Unable to assess due to encounter type.   ECOG = 3  0 - Asymptomatic (Fully active, able to carry on all predisease activities without restriction)  1 - Symptomatic but completely ambulatory (Restricted in physically strenuous activity but ambulatory and able to carry out work of a light or sedentary nature. For example, light housework, office work)  2 - Symptomatic, <50% in bed during the day (Ambulatory and capable of all self care but unable to carry out any work activities. Up and about more than 50% of waking hours)  3 - Symptomatic, >50% in bed, but not bedbound (Capable of only limited self-care,  confined to bed or chair 50% or more of waking hours)  4 - Bedbound (Completely disabled. Cannot carry on any self-care. Totally confined to bed or chair)  5 - Death   Eustace Pen MM, Creech RH, Tormey DC, et al. [phone]). "Toxicity and response criteria of the [facility] Group". [place] Oncol. 5 (6): 649-55    LABORATORY DATA:  Lab Results  Component Value Date   WBC 10.1 12/09/2018   HGB 10.9 (L) 12/09/2018   HCT 32.9 (L) 12/09/2018   MCV 106.1 (H) 12/09/2018   PLT 232 12/09/2018   Lab Results  Component Value Date   NA 135 12/09/2018   K 4.2 12/09/2018   CL 96 (L) 12/09/2018   CO2 29 12/09/2018   Lab Results  Component Value Date   ALT 54 (H) 12/09/2018   AST 31 12/09/2018   ALKPHOS 86 12/09/2018   BILITOT 0.9 12/09/2018      RADIOGRAPHY: Ct Soft Tissue Neck W Contrast  Result Date: 12/09/2018 CLINICAL DATA:  43 year old female with metastatic breast cancer and increasing neck pain and swelling. Currently on chemotherapy. Query as feces syndrome. EXAM: CT NECK WITH CONTRAST TECHNIQUE: Multidetector CT imaging of the neck was performed using the standard protocol following the bolus administration of intravenous contrast. CONTRAST:  125m OMNIPAQUE IOHEXOL 300 MG/ML SOLN in conjunction with contrast enhanced imaging of the chest reported separately. COMPARISON:  Chest CT today reported separately. Brain MRI 08/02/2018. FINDINGS: Pharynx and larynx: The glottis is closed. Laryngeal and pharyngeal soft tissue contours are within normal limits. Negative parapharyngeal spaces. Negative retropharyngeal space. Salivary glands: Negative sublingual space. Submandibular glands and parotid glands are within normal limits. Thyroid: Diminutive, negative. Lymph nodes: No cervical lymphadenopathy. Diminutive bilateral cervical nodes. Vascular: The bilateral internal jugular veins are patent. The left IJ is dominant but there is a right IJ approach porta cath in place. The  visible bilateral subclavian veins are patent. There is functional stenosis of  the right innominate vein at the site of port catheter (coronal image 72), but the visible SVC remains patent. See also chest CT findings today reported separately. In the neck and at the skull base the major vascular structures are patent. Limited intracranial: Negative. Visualized orbits: Negative. Mastoids and visualized paranasal sinuses: Clear. Skeleton: Lytic metastasis in the C3 vertebral body is eccentric to the right and breaks through the posterior cortex (series 14, image 49 and sagittal image 48. No obvious epidural extension. There are patent epidural veins overlying the lesion. No other osseous metastasis identified in the neck. Upper chest: Reported separately today. IMPRESSION: 1. Patent major vascular structures in the neck and at the skull base. 2. 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 The See CT Chest today reported separately. 3. Lytic C3 vertebral body metastasis which has broken through the posterior cortex, but no epidural tumor is evident by CT. 4. No other metastatic disease identified in the neck. Electronically Signed   By: Genevie Ann M.D.   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 CONTRAST:  147m OMNIPAQUE IOHEXOL 300 MG/ML  SOLN COMPARISON:  PET-CT, 08/02/2018 FINDINGS: Cardiovascular: Interval placement of a right chest port catheter. There is new narrowing of the right brachiocephalic vein about the port catheter tubing (series 9, image 46) with new prominent small internal mammary and chest wall venous collaterals about the chest. Normal heart size. No pericardial  effusion. Mediastinum/Nodes: No enlarged mediastinal, hilar, or axillary lymph nodes. Thyroid gland, trachea, and esophagus demonstrate no significant findings. Lungs/Pleura: There are numerous bilateral pulmonary nodules of varying sizes, a number of dominant nodule seen on initial staging examination dated 08/02/2018 not significantly changed in size, however with numerous new small pulmonary nodules throughout the lungs, for example a 5 mm nodule in the left lower lobe (series 11, image 86) and adjacent 2-3 mm nodules of the right lower lobe (series 11, image 113). No pleural effusion or pneumothorax. Upper Abdomen: No acute abnormality. Hepatic steatosis. Unchanged small left adrenal nodule, partially imaged. Musculoskeletal: Status post bilateral mastectomy and implant reconstruction. IMPRESSION: 1. Interval placement of a right chest port catheter. There is new narrowing of the right brachiocephalic vein about the port catheter tubing (series 9, image 46) with new prominent small internal mammary and chest wall venous collaterals about the chest. 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 The superior vena cava itself is widely patent to the cavoatrial junction. A specifically tailored multiphasic CT central venogram may be helpful to further assess functional degree of stenosis and diagnostic/therapeutic interventional venogram can be considered. 2. There are numerous bilateral pulmonary nodules of varying sizes, a number of dominant nodule seen on initial staging examination dated 08/02/2018 not significantly changed in size, however with numerous new small pulmonary nodules throughout the lungs, for example a 5 mm nodule in the left lower lobe (series 11, image 86) and adjacent 2-3 mm nodules of the right lower lobe (series 11, image 113). Findings are consistent with worsened pulmonary metastatic disease. Electronically Signed   By: AEddie CandleM.D.   On: 12/09/2018 19:24   Mr  Cervical Spine Wo Contrast  Result Date: 12/10/2018 CLINICAL DATA:  Onset severe right neck  pain 2-3 days ago. History of metastatic breast cancer. EXAM: MRI CERVICAL SPINE WITHOUT CONTRAST TECHNIQUE: Multiplanar, multisequence MR imaging of the cervical spine was performed. No intravenous contrast was administered. COMPARISON:  None. FINDINGS: Alignment: Maintained. Vertebrae: There is a lesion in the right side of the C3 vertebral body measuring approximately 1.3 cm AP x 1.3 cm transverse x 1.2 cm craniocaudal highly suspicious for a metastatic deposit. T2 hyperintensity is also seen in the left transverse processes of C7 and T1 worrisome for metastatic disease. No fracture. Cord: Normal signal throughout. Posterior Fossa, vertebral arteries, paraspinal tissues: Negative. Disc levels: C2-3: Mild disc bulge without stenosis. C3-4: The posterior margin of C3 on the right is mildly expanded by tumor deposit but the central canal and foramina appear open. No disc bulge or protrusion. C4-5: Minimal disc bulge without stenosis. C5-6: There is a shallow disc bulge and some uncovertebral disease. No stenosis. C6-7: Negative. C7-T1: Negative. IMPRESSION: Findings most consistent with metastatic disease in the C3 vertebral body and left transverse processes of C7 and T1. The posterior margin of C3 is mildly expanded but no central canal or foraminal stenosis is identified. Postcontrast imaging to evaluate for epidural tumor extension is recommended. Whole-body bone scan could be used to evaluate for additional sites of metastatic disease. These results will be called to the ordering clinician or representative by the Radiologist Assistant, and communication documented in the PACS or zVision Dashboard. Electronically Signed   By: Inge Rise M.D.   On: 12/10/2018 05:38   Dg Hip Unilat With Pelvis 2-3 Views Right  Result Date: 12/10/2018 CLINICAL DATA:  Right hip pain EXAM: DG HIP (WITH OR WITHOUT PELVIS) 2-3V  RIGHT COMPARISON:  None. FINDINGS: No fracture or dislocation at the right hip. There is a linear lucency of the right inferior pubic ramus without displacement. The visualized pelvic and left hip osseous structures are normal otherwise. IMPRESSION: Linear lucency of the right inferior pubic ramus may indicate a nondisplaced fracture (CT would be more conclusive). No fracture or dislocation of the right hip. Electronically Signed   By: Ulyses Jarred M.D.   On: 12/10/2018 19:27       IMPRESSION/PLAN: 1. 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 Dr. [name] has reviewed her case and her imaging. In an ideal scenario he would like to offer more local control with curative intent to the spine at C3 and C7-T1. The options for treatment would include single fraction stereotactic radiosurgery [facility]) to these cervical spine sites for a better chance at local control, versus palliative radiotherapy given to the cervical spine, covering the upper T spine, daily over 3 weeks (due to plans for beginning systemic therapy)  She is aware that either form of treatment could improve her symptoms of pain, but that it can take up to two weeks from the start of a therapy for a patient to have a noticeable difference. She elects to proceed with SRS and a repeat MRI through C2-4 and C6-T2 will need to be performed. I've reached out to MRI at [facility] to coordinate while she's inpatient. We could proceed with simulation tomorrow and begin the process of coordinating multi-isocenter treatment. We will also follow up with the results of her pelvic MRI for Dr. [name] to review and then make recommendations of the fractionation scheme for treatment to the right pubic ramus.  We discussed the risks, benefits, short, and long  term effects of radiotherapy, and the patient is interested in proceeding. Dr. [name] discusses the  delivery and logistics of radiotherapy and will anticipate simulation tomorrow.  2. Pain secondary to #1. She is aware that the hospitalist team will be very helpful in trying to maximize her pain medication so she can have enough relief to complete therapy as an outpatient. 3. 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 The patient is bothered by her symptoms. She is curious if having her PAC removed would make much difference in her symptoms and if so may consider proceeding. She intends to reach out to Dr. Donne Hazel for his thoughts on options.  In a visit lasting 70 minutes, greater than 50% of the time was spent by phone and in floor time, coordinating the patient's care.     Carola Rhine, PAC

## 2018-12-12 ENCOUNTER — Ambulatory Visit
Admission: RE | Admit: 2018-12-12 | Discharge: 2018-12-12 | Disposition: A | Discharge status: Home / Self Care | Payer: 59 | Source: Ambulatory Visit | Attending: Radiation Oncology | Admitting: Radiation Oncology

## 2018-12-12 DIAGNOSIS — I871 Compression of vein: Secondary | ICD-10-CM

## 2018-12-12 DIAGNOSIS — C7951 Secondary malignant neoplasm of bone: Secondary | ICD-10-CM

## 2018-12-12 DIAGNOSIS — S14113A Complete lesion at C3 level of cervical spinal cord, initial encounter: Secondary | ICD-10-CM

## 2018-12-12 DIAGNOSIS — E039 Hypothyroidism, unspecified: Secondary | ICD-10-CM

## 2018-12-12 DIAGNOSIS — E876 Hypokalemia: Secondary | ICD-10-CM

## 2018-12-12 DIAGNOSIS — C50919 Malignant neoplasm of unspecified site of unspecified female breast: Secondary | ICD-10-CM

## 2018-12-12 DIAGNOSIS — G893 Neoplasm related pain (acute) (chronic): Secondary | ICD-10-CM

## 2018-12-12 LAB — CBC
HCT: 35.5 % — ABNORMAL LOW (ref 36.0–46.0)
Hemoglobin: 11 g/dL — ABNORMAL LOW (ref 12.0–15.0)
MCH: 33.8 pg (ref 26.0–34.0)
MCHC: 31 g/dL (ref 30.0–36.0)
MCV: 109.2 fL — ABNORMAL HIGH (ref 80.0–100.0)
Platelets: 296 10*3/uL (ref 150–400)
RBC: 3.25 MIL/uL — ABNORMAL LOW (ref 3.87–5.11)
RDW: 19.5 % — ABNORMAL HIGH (ref 11.5–15.5)
WBC: 8.3 10*3/uL (ref 4.0–10.5)
nRBC: 0 % (ref 0.0–0.2)

## 2018-12-12 LAB — BASIC METABOLIC PANEL
Anion gap: 10 (ref 5–15)
BUN: 21 mg/dL — ABNORMAL HIGH (ref 6–20)
CO2: 26 mmol/L (ref 22–32)
Calcium: 8.7 mg/dL — ABNORMAL LOW (ref 8.9–10.3)
Chloride: 100 mmol/L (ref 98–111)
Creatinine, Ser: 0.69 mg/dL (ref 0.44–1.00)
GFR calc Af Amer: >60 mL/min (ref >60)
GFR calc non Af Amer: >60 mL/min (ref >60)
Glucose, Bld: 96 mg/dL (ref 70–99)
Potassium: 3.1 mmol/L — ABNORMAL LOW (ref 3.5–5.1)
Sodium: 136 mmol/L (ref 135–145)

## 2018-12-12 NOTE — Progress Notes (Signed)
HEMATOLOGY-ONCOLOGY PROGRESS NOTE  SUBJECTIVE: Doing much better with neck pain.  She woke up this morning with sternal pain and the plan is to receive radiation to the sternum.  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, estrogen receptor negative ([place])  10/20/2016 Mammogram   Mammogram and ultrasound of the left breast revealed 1.7 cm mass at 4:00 position, 6:30 position 5 x 4 x 4 mm mass, 6:00 position 5 cm nipple 7 x 6 x 11 mm, left axillary lymph node with thickened cortex, T1c N1 stage II a AJCC 8   10/24/2016 Initial Diagnosis   Left breast biopsy 6:30 position 3 cm from nipple: IDC grade 2, DCIS, ER 0%, PR 0%, Ki-67 15% HER-2 positive ratio 2.1; 4:00 position 3 cm from nipple: IDC grade 2, DCIS, ER 0%, PR 0%, Ki-67 35%, HER-2 positive ratio 2.02; left axillary lymph node biopsy positive   11/04/2016 - 02/17/2017 Neo-Adjuvant Chemotherapy   TCH Perjeta 6 cycles followed by Herceptin + Perjeta maintenance to be completed September 2019   11/30/2016 Genetic Testing   Negative genetic testing on the common hereditary cancer panel.  The Hereditary Gene Panel offered by [facility] includes sequencing and/or deletion duplication testing of the following 47 genes: APC, ATM, AXIN2, BARD1, BMPR1A, BRCA1, BRCA2, BRIP1, CDH1, CDK4, CDKN2A (p14ARF), CDKN2A (p16INK4a), CHEK2, CTNNA1, DICER1, EPCAM (Deletion/duplication testing only), GREM1 (promoter region deletion/duplication testing only), KIT, MEN1, MLH1, MSH2, MSH3, MSH6, MUTYH, NBN, NF1, NHTL1, PALB2, PDGFRA, PMS2, POLD1, POLE, PTEN, RAD50, RAD51C, RAD51D, SDHB, SDHC, SDHD, SMAD4, SMARCA4. STK11, TP53, TSC1, TSC2, and VHL.  The following genes were evaluated for sequence changes only: SDHA and HOXB13 c.251G>A variant only. The report date is November 30, 2016.    03/27/2017 Surgery   Bilateral mastectomies: Left mastectomy: IDC grade 2 0.9 cm, nodes negative, right mastectomy benign, ER 0%, PR 0%, HER-2 positive  ratio 2.6   05/08/2017 - 06/09/2017 Radiation Therapy   Adjuvant radiation therapy   10/23/2017 Miscellaneous   Neratinib discontinued after 4 weeks for severe diarrhea   07/25/2018 Relapse/Recurrence   MRI of right elbow showed bone lesion consistent with malignancy. PET scan showed bilateral pulmonary nodules and several lytic bone lesions compatible with metastatic disease. Brain MRI on 08/02/18 showed no evidence of metastatic disease.   08/02/2018 PET scan   Bilateral hypermetabolic lung nodules, LUL 1.3 cm with SUV 3.88, lingular nodule 1.4 cm SUV 3.7, central lingular nodule 1.2 cm SUV 9.76, right middle lobe nodule 1.5 cm SUV 9.9, lytic bone metastases inferior pubic ramus, sacrum, T12, right 11th rib.   08/08/2018 Procedure   Lung biopsy: metastatic carcinoma, HER-2 negative (0), ER/PR negative.   08/10/2018 -  Radiation Therapy   Palliative radiatio to the right humerus along the medial condyle   08/24/2018 - 09/05/2018 Radiation Therapy   Palliative radiation to the right 11th rib and right elbow   09/26/2018 - 12/04/2018 Chemotherapy   Carboplatin atezolizumab at [facility] with Dr. Janan Halter on [facility] clinical trial stopped because of new T5 metastases (toxicities included myopathy required prednisone, immune mediated thyroiditis), right upper extremity DVT on apixaban   12/14/2018 -  Chemotherapy   The patient had eriBULin mesylate (HALAVEN) 2.65 mg in sodium chloride 0.9 % 100 mL chemo infusion, 1.4 mg/m2 = 2.65 mg, Intravenous,  Once, 0 of 4 cycles  for chemotherapy treatment.    Bone metastases ([place])  08/07/2018 Initial Diagnosis   Bone metastases ([place])   12/13/2018 -  Chemotherapy  The patient had pertuzumab (PERJETA) 420 mg in sodium chloride 0.9 % 250 mL chemo infusion, 420 mg (100 % of original dose 420 mg), Intravenous, Once, 0 of 6 cycles Dose modification: 420 mg (original dose 420 mg, Cycle 1, Reason: Provider Judgment) trastuzumab-dkst (OGIVRI) 609 mg in sodium  chloride 0.9 % 250 mL chemo infusion, 8 mg/kg = 609 mg, Intravenous,  Once, 0 of 6 cycles  for chemotherapy treatment.      OBJECTIVE: REVIEW OF SYSTEMS:   Pain in the neck sternum back.  Otherwise all of the systems negative Constipation which got relieved today.  PHYSICAL EXAMINATION: ECOG PERFORMANCE STATUS: 2 - Symptomatic, <50% confined to bed  Vitals:   12/12/18 0515 12/12/18 1415  BP: 130/83 (!) 133/92  Pulse: 71 89  Resp: 17 20  Temp: 98.2 F (36.8 C) 98.8 F (37.1 C)  SpO2: 96% 95%   Filed Weights   12/11/18 0200  Weight: 169 lb 11.2 oz (77 kg)    No pertinent physical exam findings LABORATORY DATA:  I have reviewed the data as listed CMP Latest Ref Rng & Units 12/12/2018 12/09/2018 08/20/2018  Glucose 70 - 99 mg/dL 96 119(H) 92  BUN 6 - 20 mg/dL 21(H) 16 12  Creatinine 0.44 - 1.00 mg/dL 0.69 0.74 0.97  Sodium 135 - 145 mmol/L 136 135 138  Potassium 3.5 - 5.1 mmol/L 3.1(L) 4.2 3.5  Chloride 98 - 111 mmol/L 100 96(L) 101  CO2 22 - 32 mmol/L _0 Calcium 8.9 - 10.3 mg/dL 8.7(L) 9.3 8.8(L)  Total Protein 6.5 - 8.1 g/dL - 7.0 -  Total Bilirubin 0.3 - 1.2 mg/dL - 0.9 -  Alkaline Phos 38 - 126 U/L - 86 -  AST 15 - 41 U/L - 31 -  ALT 0 - 44 U/L - 54(H) -    Lab Results  Component Value Date   WBC 8.3 12/12/2018   HGB 11.0 (L) 12/12/2018   HCT 35.5 (L) 12/12/2018   MCV 109.2 (H) 12/12/2018   PLT 296 12/12/2018   NEUTROABS 8.9 (H) 12/09/2018    ASSESSMENT AND PLAN:  Metastatic breast cancer with bone metastases currently on pain relief with OxyContin and Dilaudid Palliative addition therapy is ongoing. Treatment plan: I recommended that we start her chemotherapy with [place] trastuzumab Pertuzumab as inpatient. In order to do that we will need an echocardiogram which will be requested for tomorrow. As an outpatient we can also add bisphosphonate therapy with Delton See to her treatment.

## 2018-12-12 NOTE — Progress Notes (Signed)
PROGRESS NOTE  Laura Anthony QPR:[id number] DOB: 01/07/1976   PCP: [name], MD  Patient is from: Home  DOA: 12/09/2018 LOS: 2  Brief Narrative / Interim history: Patient is a 43 year old female with history of metastatic breast CA, provoked DVT of the right upper extremity on Eliquis, hyperlipidemia, right shoulder rotator cuff impingement syndrome, presented with neck pain.  Patient reported 1 day history of severe right-sided neck pain, no fall or any injuries to her neck.  Patient also reported her face appears swollen and little red.  No focal weakness or paresthesias of the extremities.  No dyspnea or dysphagia.  Patient has breast CA with metastasis to several bones, enrolled in the clinical trial at [facility].  Recent scans done at [facility] revealed that there was progression of the disease. CT soft tissue neck showed lytic C3 vertebral body metastasis, broken through the posterior cortex but no epidural tumor evident on CT.  CT chest showed new narrowing of the right brachiocephalic vein, suggestive of right brachiocephalic vein stenosis, SVC widely patent to cavoatrial junction  Patient was admitted at [facility], was evaluated by neurosurgery, placed on Aspen collar.  MRI C-spine revealed metastatic disease in the C3 vertebral body, left transverse process of C7 and T1 with no obvious central or foraminal stenosis, no surgical intervention at this time and recommended stereotactic radiosurgery neurosurgery.  Patient also complained of right groin pain on ambulation.  Right hip and pelvic x-ray showed linear lucency of the right inferior pubic rami may indicate nondisplaced fracture, no fracture or dislocation of the right hip.    Patient was also seen by oncology, radiation oncology, transferred to [facility] for simulation and radiation therapy.  Subjective: No major events overnight of this morning.  Complaints new significant sternal pain.  Pain is  reproducible to palpation.  Worse with deep inhalation.  She rates her pain 6/10.  Received 2 mg of IV Dilaudid about an hour ago.  Denies shortness of breath, nausea, vomiting, abdominal pain or UTI symptoms.  Neck pain and hip pain fairly controlled.  Objective: Vitals:   12/11/18 1539 12/11/18 2059 12/12/18 0515 12/12/18 1415  BP: 138/84 (!) 120/96 130/83 (!) 133/92  Pulse: 93 84 71 89  Resp: _0 Temp: 98.7 F (37.1 C) 98.5 F (36.9 C) 98.2 F (36.8 C) 98.8 F (37.1 C)  TempSrc: Oral Oral Oral Oral  SpO2: 93% 97% 96% 95%  Weight:      Height:       No intake or output data in the 24 hours ending 12/12/18 1418 Filed Weights   12/11/18 0200  Weight: 77 kg    Examination:  GENERAL: No acute distress.  Appears well.  HEENT: MMM.  Vision and hearing grossly intact.  NECK: Supple.  No apparent JVD.  RESP:  No IWOB. Good air movement bilaterally. CVS:  RRR. Heart sounds normal.  ABD/GI/GU: Bowel sounds present. Soft. Non tender.  MSK/EXT:  No apparent deformity or edema. Moves extremities. SKIN: no apparent skin lesion or wound NEURO: Awake, alert and oriented appropriately.  No gross deficit.  PSYCH: Calm. Normal affect.  Port-a-cath on right chest.  Assessment & Plan: Progressive stage IV metastatic breast CA with osseous mets (C3, sternum, left transverse process of C7 and T1, pelvis), lungs, ER/PR negative, HER-2/neu amplified Neck, sternal and hip pain due to metastatic lesion -Follows Dr. Lindi Adie and involved in clinical trial at [facility]. -MRI of the C-spine confirmed mets C3,  left TP of C7 and T1 with no central or foraminal stenosis. -CT pelvis with 2 cm rounded lucency in right inferior pubic ramus and 2.4 cm bilobed lucency in sacrum concerning for lytic lesion -Neurosurgery consulted and recommended XRT.  Soft C-collar okay. -Radiation oncology & oncology planning to start palliative treatment for sternal, sacral and pelvic lesion 11/12, and SRS  treatment for T3 and C7-T1 lesion next Wednesday. -Continue scheduled MS Contin, and oxycodone and Dilaudid as needed -Aggressive bowel regimen. -PT eval  Brachiocephalic vein stenosis/facial swelling -CT chest showed new narrowing of the right brachiocephalic vein about the port catheter tubing with new prominent small internal mammary and chest wall venous collaterals about the chest suggestive of right brachiocephalic vein stenosis, SVC itself is widely patent. -Dr. Eliseo Squires discussed with Dr. Vernard Gambles, recommended no intervention at this time.  There is stenosis but left IJ is patent and collateral venous drainage will be slow but suffice, no clot burden to be dealt with at this time.   History of provoked RUE DVT in the setting of metastatic breast CA -Continue Eliquis for anticoagulation  Macrocytic anemia, mild to due to recent chemo and underlying malignancy -H&H currently stable  Depression Continue Prozac, mirtazapine  Hypothyroidism Continue Synthroid  Hypokalemia: -Replenish and recheck.  Anemia of chronic disease: H&H stable.                DVT prophylaxis: On Eliquis Code Status: Full code Family Communication: Updated patient and patient's husband at bedside. Disposition Plan: Remains inpatient pending clearance by radiation oncology. Consultants: Oncology, radiation oncology, surgery  Procedures:  None  Microbiology summarized: 11/8-COVID-19 screen negative.  Sch Meds:  Scheduled Meds: . acetaminophen  1,000 mg Oral Q8H  . apixaban  5 mg Oral BID  . FLUoxetine  40 mg Oral Daily  . gabapentin  300 mg Oral QPM  . levothyroxine  88 mcg Oral QAC breakfast  . lisdexamfetamine  30 mg Oral Q breakfast  . mirtazapine  15 mg Oral QHS  . morphine  30 mg Oral Q12H  . polyethylene glycol  17 g Oral Daily  . predniSONE  30 mg Oral Q breakfast  . senna-docusate  1 tablet Oral BID   Continuous Infusions: PRN Meds:.HYDROmorphone (DILAUDID) injection,  [START ON 12/13/2018] influenza vac split quadrivalent PF, LORazepam, ondansetron, oxyCODONE, prochlorperazine, traZODone  Antimicrobials: Anti-infectives (From admission, onward)   None       I have personally reviewed the following labs and images: CBC: Recent Labs  Lab 12/09/18 1727 12/12/18 0529  WBC 10.1 8.3  NEUTROABS 8.9*  --   HGB 10.9* 11.0*  HCT 32.9* 35.5*  MCV 106.1* 109.2*  PLT 232 296   BMP &GFR Recent Labs  Lab 12/09/18 1727 12/12/18 0529  NA 135 136  K 4.2 3.1*  CL 96* 100  CO2 29 26  GLUCOSE 119* 96  BUN 16 21*  CREATININE 0.74 0.69  CALCIUM 9.3 8.7*   Estimated Creatinine Clearance: 98.1 mL/min (by C-G formula based on SCr of 0.69 mg/dL). Liver & Pancreas: Recent Labs  Lab 12/09/18 1727  AST 31  ALT 54*  ALKPHOS 86  BILITOT 0.9  PROT 7.0  ALBUMIN 3.4*   No results for input(s): LIPASE, AMYLASE in the last 168 hours. No results for input(s): AMMONIA in the last 168 hours. Diabetic: No results for input(s): HGBA1C in the last 72 hours. No results for input(s): GLUCAP in the last 168 hours. Cardiac Enzymes: No results for input(s): CKTOTAL, CKMB, CKMBINDEX, TROPONINI  in the last 168 hours. No results for input(s): PROBNP in the last 8760 hours. Coagulation Profile: No results for input(s): INR, PROTIME in the last 168 hours. Thyroid Function Tests: No results for input(s): TSH, T4TOTAL, FREET4, T3FREE, THYROIDAB in the last 72 hours. Lipid Profile: No results for input(s): CHOL, HDL, LDLCALC, TRIG, CHOLHDL, LDLDIRECT in the last 72 hours. Anemia Panel: No results for input(s): VITAMINB12, FOLATE, FERRITIN, TIBC, IRON, RETICCTPCT in the last 72 hours. Urine analysis: No results found for: COLORURINE, APPEARANCEUR, LABSPEC, PHURINE, GLUCOSEU, HGBUR, BILIRUBINUR, KETONESUR, PROTEINUR, UROBILINOGEN, NITRITE, LEUKOCYTESUR Sepsis Labs: Invalid input(s): PROCALCITONIN, [place]  Microbiology: Recent Results (from the past 240 hour(s))   SARS CORONAVIRUS 2 (TAT 6-24 HRS) Nasopharyngeal Nasopharyngeal Swab     Status: None   Collection Time: 12/09/18  8:24 PM   Specimen: Nasopharyngeal Swab  Result Value Ref Range Status   SARS Coronavirus 2 NEGATIVE NEGATIVE Final    Comment: (NOTE) SARS-CoV-2 target nucleic acids are NOT DETECTED. The SARS-CoV-2 RNA is generally detectable in upper and lower respiratory specimens during the acute phase of infection. Negative results do not preclude SARS-CoV-2 infection, do not rule out co-infections with other pathogens, and should not be used as the sole basis for treatment or other patient management decisions. Negative results must be combined with clinical observations, patient history, and epidemiological information. The expected result is Negative. Fact Sheet for Patients: SugarRoll.be Fact Sheet for Healthcare Providers: https://www.woods-mathews.com/ This test is not yet approved or cleared by the [place] FDA and  has been authorized for detection and/or diagnosis of SARS-CoV-2 by FDA under an Emergency Use Authorization (EUA). This EUA will remain  in effect (meaning this test can be used) for the duration of the COVID-19 declaration under Section 56 4(b)(1) of the Act, 21 U.S.C. section 360bbb-3(b)(1), unless the authorization is terminated or revoked sooner. Performed at [facility], [place] [address] [place], [place] [address]     Radiology Studies: Mr Cervical Spine W Wo Contrast  Result Date: 12/11/2018 CLINICAL DATA:  Abnormal noncontrast cervical spine study EXAM: MRI CERVICAL SPINE WITHOUT AND WITH CONTRAST TECHNIQUE: Multiplanar and multiecho pulse sequences of the cervical spine, to include the craniocervical junction and cervicothoracic junction, were obtained without and with intravenous contrast. CONTRAST:  72m GADAVIST GADOBUTROL 1 MMOL/ML IV SOLN COMPARISON:  Cervical spine MRI December 10, 2018 FINDINGS:  Alignment: Stable. Vertebrae: Stable vertebral body heights. Abnormal STIR hyperintensity enhancement is again identified within the C3 vertebral body eccentric to the right. There is mild right ventral epidural extension partially effacing the subarachnoid space. Previously questioned T2 hyperintensity involving the left transverse processes of C7 and T1 cannot be definitively characterized as metastatic disease as this area is not covered on STIR or postcontrast imaging. Therefore, this could reflect fat signal rather than edema. Cord: No abnormal cord signal. Posterior Fossa, vertebral arteries, paraspinal tissues: Unremarkable. Disc levels: No change since 1 day prior. No significant degenerative stenosis. IMPRESSION: Metastatic lesion at C3 with mild right ventral epidural extension partially effacing the subarachnoid space. No cord compression. Previously questioned left C7 and T1 transverse process lesions cannot be definitively characterized metastases as this area incompletely included on this study. Electronically Signed   By: PMacy MisM.D.   On: 12/11/2018 16:12   35 minutes with more than 50% spent in reviewing records, counseling patient/family and coordinating care.  Taye T. [place] If 7PM-7AM, please contact night-coverage [email or web address] Password [email or web address] 12/12/2018, 2:18 PM

## 2018-12-12 NOTE — Progress Notes (Addendum)
The patient came to our department for simulation today. We consented her for [facility] treatment to C3, C7-T1. She will receive this next Wednesday. She would also benefit from palliative treatment to the right pelvic ramus, sacrum (based on CT imaging), and today started noticing pain in her right sternum so after film review this will also be treated. We will begin the palliative treatment tomorrow for 3 weeks so she can still receive systemic therapy.      Carola Rhine, PAC

## 2018-12-12 NOTE — Progress Notes (Signed)
Discussed w/ Dr. Lindi Adie - plan to begin [place] as inpatient on 12/14/18.  08/23/17 ECHO: EF = 60-65% Pt previously on Trastuzumab/Pertuzumab as neoadj tx. (11/04/16 - 10/12/17)  Most recently was on clinical trial at [facility] w/ Carbo/Atezolizumab; d/c'd due to new T5 lesion.  PA auth'd for [facility] (trastuzumab-dkst)/Pertuzumab.  IP chemo team has been alerted of planned chemo start.  Kennith Center, Pharm.D., CPP 12/12/2018@4 :09 PM

## 2018-12-13 ENCOUNTER — Ambulatory Visit
Admission: RE | Admit: 2018-12-13 | Discharge: 2018-12-13 | Disposition: A | Discharge status: Home / Self Care | Payer: 59 | Source: Ambulatory Visit | Attending: Radiation Oncology | Admitting: Radiation Oncology

## 2018-12-13 ENCOUNTER — Ambulatory Visit: Payer: 59 | Admitting: Radiation Oncology

## 2018-12-13 ENCOUNTER — Inpatient Hospital Stay (HOSPITAL_COMMUNITY): Payer: 59

## 2018-12-13 DIAGNOSIS — I871 Compression of vein: Secondary | ICD-10-CM

## 2018-12-13 LAB — MAGNESIUM: Magnesium: 2.2 mg/dL (ref 1.7–2.4)

## 2018-12-13 LAB — BASIC METABOLIC PANEL
Anion gap: 10 (ref 5–15)
BUN: 17 mg/dL (ref 6–20)
CO2: 28 mmol/L (ref 22–32)
Calcium: 8.5 mg/dL — ABNORMAL LOW (ref 8.9–10.3)
Chloride: 98 mmol/L (ref 98–111)
Creatinine, Ser: 0.74 mg/dL (ref 0.44–1.00)
GFR calc Af Amer: >60 mL/min (ref >60)
GFR calc non Af Amer: >60 mL/min (ref >60)
Glucose, Bld: 90 mg/dL (ref 70–99)
Potassium: 3.3 mmol/L — ABNORMAL LOW (ref 3.5–5.1)
Sodium: 136 mmol/L (ref 135–145)

## 2018-12-13 LAB — ECHOCARDIOGRAM COMPLETE
Height: 67 in
Weight: 2715.19 oz

## 2018-12-13 MED ORDER — POTASSIUM CHLORIDE CRYS ER 20 MEQ PO TBCR
40.0000 meq | EXTENDED_RELEASE_TABLET | ORAL | Status: AC
  Administered 2018-12-13 (×2): 40 meq via ORAL
  Filled 2018-12-13 (×2): qty 2

## 2018-12-13 MED ORDER — ALUM & MAG HYDROXIDE-SIMETH 200-200-20 MG/5ML PO SUSP
30.0000 mL | Freq: Four times a day (QID) | ORAL | Status: DC | PRN
  Administered 2018-12-13: 30 mL via ORAL
  Filled 2018-12-13: qty 30

## 2018-12-13 NOTE — Evaluation (Signed)
Occupational Therapy Evaluation Patient Details Name: Laura Anthony MRN: [id number] DOB: Dec 14, 1975 Today's Date: 12/13/2018    History of Present Illness 43 year old female with history of metastatic breast CA, provoked DVT of the right upper extremity on Eliquis, hyperlipidemia, right shoulder rotator cuff impingement syndrome, presented with neck pain. Imaging showed mets to C3, C7, T1, R inferior pubic rami.   Clinical Impression   Pt was admitted for the above.  At baseline, she is independent/mod I with adls. She is at this level now, but she c/o dropping things.  Provided lidded mug with handle, washmitt, built up foam for utensils and pencil.  Also educated on energy conservation.  No further OT is needed at this time    Follow Up Recommendations  No OT follow up    Equipment Recommendations  None recommended by OT    Recommendations for Other Services       Precautions / Restrictions Precautions Required Braces or Orthoses: Cervical Brace Cervical Brace: (don in sitting; off for showering) Restrictions Weight Bearing Restrictions: No      Mobility Bed Mobility Overal bed mobility: Modified Independent             General bed mobility comments: HOB up, used rail  Transfers Overall transfer level: Modified independent Equipment used: None             General transfer comment: used bedrail for single UE support    Balance Overall balance assessment: Modified Independent                                         ADL either performed or assessed with clinical judgement   ADL Overall ADL's : Modified independent                                       General ADL Comments: has a tendency to drop things:  can feel light touch and proprioception, but may not be normal. She has a h/o neuropathy. Provided lidded mug, built up foam for toothbrush and utensils and pencil and also a wash mitt.  Educated on Psychologist, clinical      Pertinent Vitals/Pain Pain Assessment: 0-10 Pain Score: 4  Pain Location: sternum Pain Descriptors / Indicators: Sore Pain Intervention(s): Limited activity within patient's tolerance;Monitored during session     Hand Dominance     Extremity/Trunk Assessment Upper Extremity Assessment Upper Extremity Assessment: Overall WFL for tasks assessed(able to feel light touch and proprioception)      Cervical / Trunk Assessment Cervical / Trunk Assessment: Normal   Communication Communication Communication: No difficulties   Cognition Arousal/Alertness: Awake/alert Behavior During Therapy: WFL for tasks assessed/performed Overall Cognitive Status: Within Functional Limits for tasks assessed                                     General Comments  pt denies h/o falls in past 1 year    Exercises     Shoulder Instructions      Home Living Family/patient expects to be discharged to:: Private residence Living Arrangements: Spouse/significant other;Children Available Help at Discharge: Family;Available 24 hours/day  Type of Home: House Home Access: Stairs to enter [employer] of Steps: 1 Entrance Stairs-Rails: None Home Layout: Two level;Able to live on main level with bedroom/bathroom Alternate Level Stairs-Number of Steps: flight   Bathroom Shower/Tub: Walk-in shower         Home Equipment: Shower seat - built in   Additional Comments: husband is putting comfort height commodes in      Prior Functioning/Environment Level of Independence: Independent                 OT Problem List:        OT Treatment/Interventions:      OT Goals(Current goals can be found in the care plan section) Acute Rehab OT Goals Patient Stated Goal: to be safe and mobile, stop dropping things OT Goal Formulation: All assessment and education complete, DC therapy  OT Frequency:     Barriers  to D/C:            Co-evaluation              AM-PAC OT "6 Clicks" Daily Activity     Outcome Measure Help from another person eating meals?: None Help from another person taking care of personal grooming?: None Help from another person toileting, which includes using toliet, bedpan, or urinal?: None Help from another person bathing (including washing, rinsing, drying)?: None Help from another person to put on and taking off regular upper body clothing?: None Help from another person to put on and taking off regular lower body clothing?: None 6 Click Score: 24   End of Session    Activity Tolerance: Patient tolerated treatment well Patient left: in bed;with call bell/phone within reach  OT Visit Diagnosis: Muscle weakness (generalized) (M62.81)                Time: [id number] OT Time Calculation (min): 14 min Charges:  OT General Charges $OT Visit: 1 Visit OT Evaluation $OT Eval Low Complexity: [place], OTR/L Acute Rehabilitation Services [phone] [facility] pager [phone] office 12/13/2018  [place] 12/13/2018, 1:53 PM

## 2018-12-13 NOTE — Progress Notes (Signed)
  Echocardiogram 2D Echocardiogram has been performed.  Laura Anthony 12/13/2018, 12:26 PM

## 2018-12-13 NOTE — Evaluation (Signed)
Physical Therapy Evaluation Patient Details Name: Laura Anthony MRN: [id number] DOB: December 12, 1975 Today's Date: 12/13/2018   History of Present Illness  43 year old female with history of metastatic breast CA, provoked DVT of the right upper extremity on Eliquis, hyperlipidemia, right shoulder rotator cuff impingement syndrome, presented with neck pain. Imaging showed mets to C3, C7, T1, R inferior pubic rami.  Clinical Impression  Pt admitted with above diagnosis. Pt ambulated 220' with RW, no loss of balance. Pt reports feeling more steady with use of RW, rollator recommended for home to minimize fall risk. Discussed in detail home modifications for safety, recommended grab bars for bathroom, and for step entering home, removal of throw rugs, placement of items in kitchen at easy to reach level, use of hand held shower, etc. Pt is safe and independent with mobility using a RW, no further acute PT indicated.  Encouraged pt to ambulate in halls to minimize deconditioning during hospitalization.  Pt reported she's been dropping things for ~ 1 month, she denies sensation loss in UEs, OT consult placed.      Follow Up Recommendations No PT follow up    Equipment Recommendations  3in1 (PT);Other (comment)(rollator (4 wheeled RW))    Recommendations for Other Services       Precautions / Restrictions Precautions Required Braces or Orthoses: Cervical Brace Cervical Brace: Other (comment)(may don in sitting, can remove to shower) Restrictions Weight Bearing Restrictions: No      Mobility  Bed Mobility Overal bed mobility: Modified Independent             General bed mobility comments: HOB up, used rail  Transfers Overall transfer level: Modified independent Equipment used: None             General transfer comment: used bedrail for single UE support  Ambulation/Gait Ambulation/Gait assistance: Modified independent (Device/Increase time) Gait Distance (Feet): 220  Feet Assistive device: Rolling walker (2 wheeled) Gait Pattern/deviations: WFL(Within Functional Limits) Gait velocity: mildly decr   General Gait Details: steady with RW  Stairs            Wheelchair Mobility    Modified Rankin (Stroke Patients Only)       Balance Overall balance assessment: Modified Independent                                           Pertinent Vitals/Pain Pain Assessment: 0-10 Pain Score: 4  Pain Location: sternum Pain Descriptors / Indicators: Sore Pain Intervention(s): Limited activity within patient's tolerance;Monitored during session;Premedicated before session    Home Living Family/patient expects to be discharged to:: Private residence Living Arrangements: Spouse/significant other;Children Available Help at Discharge: Family;Available 24 hours/day Type of Home: House Home Access: Stairs to enter Entrance Stairs-Rails: None Entrance Stairs-Number of Steps: 1 Home Layout: Two level;Able to live on main level with bedroom/bathroom Home Equipment: Shower seat - built in      Prior Function Level of Independence: Independent               Hand Dominance        Extremity/Trunk Assessment   Upper Extremity Assessment Upper Extremity Assessment: Defer to OT evaluation(pt reports trouble with dropping things for about a month. AROM and sensation are intact grossly. OT order placed.)    Lower Extremity Assessment Lower Extremity Assessment: Overall WFL for tasks assessed    Cervical / Trunk Assessment Cervical / Trunk  Assessment: Normal  Communication   Communication: No difficulties  Cognition Arousal/Alertness: Awake/alert Behavior During Therapy: WFL for tasks assessed/performed Overall Cognitive Status: Within Functional Limits for tasks assessed                                        General Comments General comments (skin integrity, edema, etc.): pt denies h/o falls in past 1  year    Exercises     Assessment/Plan    PT Assessment Patent does not need any further PT services  PT Problem List         PT Treatment Interventions      PT Goals (Current goals can be found in the Care Plan section)  Acute Rehab PT Goals Patient Stated Goal: to be safe and mobile, stop dropping things PT Goal Formulation: All assessment and education complete, DC therapy    Frequency     Barriers to discharge        Co-evaluation               AM-PAC PT "6 Clicks" Mobility  Outcome Measure Help needed turning from your back to your side while in a flat bed without using bedrails?: A Little Help needed moving from lying on your back to sitting on the side of a flat bed without using bedrails?: A Little Help needed moving to and from a bed to a chair (including a wheelchair)?: None Help needed standing up from a chair using your arms (e.g., wheelchair or bedside chair)?: None Help needed to walk in hospital room?: None Help needed climbing 3-5 steps with a railing? : None 6 Click Score: 22    End of Session Equipment Utilized During Treatment: Gait belt Activity Tolerance: Patient tolerated treatment well Patient left: in bed;with call bell/phone within reach Nurse Communication: Mobility status      Time: 1021-1106 PT Time Calculation (min) (ACUTE ONLY): 45 min   Charges:   PT Evaluation $PT Eval Low Complexity: 1 Low PT Treatments $Gait Training: 8-22 mins $Therapeutic Activity: 8-22 mins       Laura Anthony PT 12/13/2018  Acute Rehabilitation Services Pager [phone] Office [phone]

## 2018-12-13 NOTE — Progress Notes (Signed)
PROGRESS NOTE  Laura Anthony RZN:[id number] DOB: 05-May-1975   PCP: [name], MD  Patient is from: Home  DOA: 12/09/2018 LOS: 3  Brief Narrative / Interim history: Patient is a 43 year old female with history of metastatic breast CA, provoked DVT of the right upper extremity on Eliquis, hyperlipidemia, right shoulder rotator cuff impingement syndrome, presented with neck pain.  Patient reported 1 day history of severe right-sided neck pain, no fall or any injuries to her neck.  Patient also reported her face appears swollen and little red.  No focal weakness or paresthesias of the extremities.  No dyspnea or dysphagia.  Patient has breast CA with metastasis to several bones, enrolled in the clinical trial at [facility].  Recent scans done at [facility] revealed that there was progression of the disease. CT soft tissue neck showed lytic C3 vertebral body metastasis, broken through the posterior cortex but no epidural tumor evident on CT.  CT chest showed new narrowing of the right brachiocephalic vein, suggestive of right brachiocephalic vein stenosis, SVC widely patent to cavoatrial junction  Patient was admitted at [facility], was evaluated by neurosurgery, placed on Aspen collar.  MRI C-spine revealed metastatic disease in the C3 vertebral body, left transverse process of C7 and T1 with no obvious central or foraminal stenosis, no surgical intervention at this time and recommended stereotactic radiosurgery neurosurgery.  Patient also complained of right groin pain on ambulation.  Right hip and pelvic x-ray showed linear lucency of the right inferior pubic rami may indicate nondisplaced fracture, no fracture or dislocation of the right hip.    Patient was also seen by oncology, radiation oncology, transferred to [facility] for simulation and chemo-radiation therapy.  Subjective: No major events overnight of this morning.  Continues to endorse substernal pain although improved  proved from yesterday.  No other complaints.  Denies dyspnea, GI or UTI symptoms.  Neck and hip pain fairly controlled.  Facial swelling better.  Objective: Vitals:   12/11/18 2059 12/12/18 0515 12/12/18 1415 12/12/18 2057  BP: (!) 120/96 130/83 (!) 133/92 122/85  Pulse: 84 71 89 84  Resp: 17 17 20 20   Temp: 98.5 F (36.9 C) 98.2 F (36.8 C) 98.8 F (37.1 C) 98.9 F (37.2 C)  TempSrc: Oral Oral Oral Oral  SpO2: 97% 96% 95% 94%  Weight:      Height:       No intake or output data in the 24 hours ending 12/13/18 1257 Filed Weights   12/11/18 0200  Weight: 77 kg    Examination:  GENERAL: No acute distress.  Appears well.  HEENT: MMM.  Vision and hearing grossly intact.  NECK: Supple.  No apparent JVD.  RESP:  No IWOB. Good air movement bilaterally. CVS:  RRR. Heart sounds normal.  ABD/GI/GU: Bowel sounds present. Soft. Non tender.  MSK/EXT:  Moves extremities. No apparent deformity or edema.  Tenderness to palpation over sternum SKIN: no apparent skin lesion or wound NEURO: Awake, alert and oriented appropriately.  No apparent focal neuro deficit. PSYCH: Calm. Normal affect.  Port-a-cath on right chest.  Assessment & Plan: Progressive stage IV metastatic breast CA with osseous mets (C3, sternum, left transverse process of C7 and T1, pelvis), lungs, ER/PR negative, HER-2/neu amplified Neck, sternal and hip pain due to metastatic lesion-continues to endorse significant sternal pain. -Follows Dr. Lindi Adie and involved in clinical trial at [facility]. -MRI of the C-spine confirmed mets C3, left TP of C7 and T1 with no central  or foraminal stenosis. -CT pelvis with 2 cm rounded lucency in right inferior pubic ramus and 2.4 cm bilobed lucency in sacrum concerning for lytic lesion -Neurosurgery consulted and recommended XRT.  Soft C-collar okay. -Palliative XRT for sternal, sacral & pelvic lesion today, and SRS tx for T3 & C7-T1 lesion next Wednesday. per radiation  oncology -Plan for inpatient chemotherapy after echocardiogram per Oncology, Dr. Lindi Adie -Continue scheduled MS Contin, and oxycodone and Dilaudid as needed -Aggressive bowel regimen. -PT eval  Brachiocephalic vein stenosis/facial swelling -CT chest showed new narrowing of the right brachiocephalic vein about the port catheter tubing with new prominent small internal mammary and chest wall venous collaterals about the chest suggestive of right brachiocephalic vein stenosis, SVC itself is widely patent. -Dr. Eliseo Squires discussed with Dr. Vernard Gambles, recommended no intervention at this time.  There is stenosis but left IJ is patent and collateral venous drainage will be slow but suffice, no clot burden to be dealt with at this time.  History of provoked RUE DVT in the setting of metastatic breast CA -Continue Eliquis for anticoagulation  Macrocytic anemia, mild to due to recent chemo and underlying malignancy -H&H currently stable  Depression Continue Prozac, mirtazapine  Hypothyroidism Continue Synthroid  Hypokalemia: -Replenish and recheck.  Anemia of chronic disease: H&H stable.                DVT prophylaxis: On Eliquis Code Status: Full code Family Communication: Updated patient and patient's husband at bedside. Disposition Plan: Remains inpatient for pain control.  Still requires significant amount of IV Dilaudid for pain control.  Radiation oncology to start XRT.  Oncology to start chemo.  Consultants: Oncology, radiation oncology, surgery  Procedures:  None  Microbiology summarized: 11/8-COVID-19 screen negative.  Sch Meds:  Scheduled Meds:  acetaminophen  1,000 mg Oral Q8H   apixaban  5 mg Oral BID   FLUoxetine  40 mg Oral Daily   gabapentin  300 mg Oral QPM   levothyroxine  88 mcg Oral QAC breakfast   lisdexamfetamine  30 mg Oral Q breakfast   mirtazapine  15 mg Oral QHS   morphine  30 mg Oral Q12H   polyethylene glycol  17 g Oral Daily    potassium chloride  40 mEq Oral Q4H   predniSONE  30 mg Oral Q breakfast   senna-docusate  1 tablet Oral BID   Continuous Infusions: PRN Meds:.HYDROmorphone (DILAUDID) injection, influenza vac split quadrivalent PF, LORazepam, ondansetron, oxyCODONE, prochlorperazine, traZODone  Antimicrobials: Anti-infectives (From admission, onward)   None       I have personally reviewed the following labs and images: CBC: Recent Labs  Lab 12/09/18 1727 12/12/18 0529  WBC 10.1 8.3  NEUTROABS 8.9*  --   HGB 10.9* 11.0*  HCT 32.9* 35.5*  MCV 106.1* 109.2*  PLT 232 296   BMP &GFR Recent Labs  Lab 12/09/18 1727 12/12/18 0529 12/13/18 0439  NA 135 136 136  K 4.2 3.1* 3.3*  CL 96* 100 98  CO2 29 26 28   GLUCOSE 119* 96 90  BUN 16 21* 17  CREATININE 0.74 0.69 0.74  CALCIUM 9.3 8.7* 8.5*  MG  --   --  2.2   Estimated Creatinine Clearance: 98.1 mL/min (by C-G formula based on SCr of 0.74 mg/dL). Liver & Pancreas: Recent Labs  Lab 12/09/18 1727  AST 31  ALT 54*  ALKPHOS 86  BILITOT 0.9  PROT 7.0  ALBUMIN 3.4*   No results for input(s): LIPASE, AMYLASE in the last 168  hours. No results for input(s): AMMONIA in the last 168 hours. Diabetic: No results for input(s): HGBA1C in the last 72 hours. No results for input(s): GLUCAP in the last 168 hours. Cardiac Enzymes: No results for input(s): CKTOTAL, CKMB, CKMBINDEX, TROPONINI in the last 168 hours. No results for input(s): PROBNP in the last 8760 hours. Coagulation Profile: No results for input(s): INR, PROTIME in the last 168 hours. Thyroid Function Tests: No results for input(s): TSH, T4TOTAL, FREET4, T3FREE, THYROIDAB in the last 72 hours. Lipid Profile: No results for input(s): CHOL, HDL, LDLCALC, TRIG, CHOLHDL, LDLDIRECT in the last 72 hours. Anemia Panel: No results for input(s): VITAMINB12, FOLATE, FERRITIN, TIBC, IRON, RETICCTPCT in the last 72 hours. Urine analysis: No results found for: COLORURINE, APPEARANCEUR,  LABSPEC, PHURINE, GLUCOSEU, HGBUR, BILIRUBINUR, KETONESUR, PROTEINUR, UROBILINOGEN, NITRITE, LEUKOCYTESUR Sepsis Labs: Invalid input(s): PROCALCITONIN, [place]  Microbiology: Recent Results (from the past 240 hour(s))  SARS CORONAVIRUS 2 (TAT 6-24 HRS) Nasopharyngeal Nasopharyngeal Swab     Status: None   Collection Time: 12/09/18  8:24 PM   Specimen: Nasopharyngeal Swab  Result Value Ref Range Status   SARS Coronavirus 2 NEGATIVE NEGATIVE Final    Comment: (NOTE) SARS-CoV-2 target nucleic acids are NOT DETECTED. The SARS-CoV-2 RNA is generally detectable in upper and lower respiratory specimens during the acute phase of infection. Negative results do not preclude SARS-CoV-2 infection, do not rule out co-infections with other pathogens, and should not be used as the sole basis for treatment or other patient management decisions. Negative results must be combined with clinical observations, patient history, and epidemiological information. The expected result is Negative. Fact Sheet for Patients: SugarRoll.be Fact Sheet for Healthcare Providers: https://www.woods-mathews.com/ This test is not yet approved or cleared by the [place] FDA and  has been authorized for detection and/or diagnosis of SARS-CoV-2 by FDA under an Emergency Use Authorization (EUA). This EUA will remain  in effect (meaning this test can be used) for the duration of the COVID-19 declaration under Section 56 4(b)(1) of the Act, 21 U.S.C. section 360bbb-3(b)(1), unless the authorization is terminated or revoked sooner. Performed at [facility], [place] [address], [place], [place] [address]     Radiology Studies: No results found.  Katharine Rochefort T. Reading  If 7PM-7AM, please contact night-coverage [email or web address] Password [email or web address] 12/13/2018, 12:57 PM

## 2018-12-14 ENCOUNTER — Inpatient Hospital Stay: Payer: 59

## 2018-12-14 ENCOUNTER — Ambulatory Visit
Admission: RE | Admit: 2018-12-14 | Discharge: 2018-12-14 | Disposition: A | Discharge status: Home / Self Care | Payer: 59 | Source: Ambulatory Visit | Attending: Radiation Oncology | Admitting: Radiation Oncology

## 2018-12-14 ENCOUNTER — Inpatient Hospital Stay: Payer: 59 | Admitting: Hematology and Oncology

## 2018-12-14 ENCOUNTER — Ambulatory Visit: Payer: 59

## 2018-12-14 ENCOUNTER — Other Ambulatory Visit: Payer: Self-pay | Admitting: Oncology

## 2018-12-14 DIAGNOSIS — F329 Major depressive disorder, single episode, unspecified: Secondary | ICD-10-CM

## 2018-12-14 LAB — CBC
HCT: 31.7 % — ABNORMAL LOW (ref 36.0–46.0)
Hemoglobin: 10 g/dL — ABNORMAL LOW (ref 12.0–15.0)
MCH: 35 pg — ABNORMAL HIGH (ref 26.0–34.0)
MCHC: 31.5 g/dL (ref 30.0–36.0)
MCV: 110.8 fL — ABNORMAL HIGH (ref 80.0–100.0)
Platelets: 303 10*3/uL (ref 150–400)
RBC: 2.86 MIL/uL — ABNORMAL LOW (ref 3.87–5.11)
RDW: 19 % — ABNORMAL HIGH (ref 11.5–15.5)
WBC: 7.3 10*3/uL (ref 4.0–10.5)
nRBC: 0 % (ref 0.0–0.2)

## 2018-12-14 LAB — BASIC METABOLIC PANEL
Anion gap: 10 (ref 5–15)
BUN: 19 mg/dL (ref 6–20)
CO2: 27 mmol/L (ref 22–32)
Calcium: 8.2 mg/dL — ABNORMAL LOW (ref 8.9–10.3)
Chloride: 100 mmol/L (ref 98–111)
Creatinine, Ser: 0.65 mg/dL (ref 0.44–1.00)
GFR calc Af Amer: >60 mL/min (ref >60)
GFR calc non Af Amer: >60 mL/min (ref >60)
Glucose, Bld: 99 mg/dL (ref 70–99)
Potassium: 3.9 mmol/L (ref 3.5–5.1)
Sodium: 137 mmol/L (ref 135–145)

## 2018-12-14 LAB — MAGNESIUM: Magnesium: 2.4 mg/dL (ref 1.7–2.4)

## 2018-12-14 MED ORDER — SODIUM CHLORIDE 0.9 % IV SOLN
1.4200 mg/m2 | Freq: Once | INTRAVENOUS | Status: AC
  Administered 2018-12-14: 2.7 mg via INTRAVENOUS
  Filled 2018-12-14: qty 5.4

## 2018-12-14 MED ORDER — HEPARIN SOD (PORK) LOCK FLUSH 100 UNIT/ML IV SOLN
500.0000 [IU] | Freq: Once | INTRAVENOUS | Status: AC
  Administered 2018-12-14: 500 [IU] via INTRAVENOUS
  Filled 2018-12-14: qty 5

## 2018-12-14 MED ORDER — DIPHENHYDRAMINE HCL 50 MG PO CAPS
50.0000 mg | ORAL_CAPSULE | Freq: Once | ORAL | Status: AC
  Administered 2018-12-14: 50 mg via ORAL
  Filled 2018-12-14: qty 1

## 2018-12-14 MED ORDER — PROCHLORPERAZINE MALEATE 10 MG PO TABS
10.0000 mg | ORAL_TABLET | Freq: Once | ORAL | Status: AC
  Administered 2018-12-14: 10 mg via ORAL
  Filled 2018-12-14: qty 1

## 2018-12-14 MED ORDER — SENNOSIDES-DOCUSATE SODIUM 8.6-50 MG PO TABS: 1.0000 | ORAL_TABLET | Freq: Two times a day (BID) | ORAL | 0 refills | Status: DC

## 2018-12-14 MED ORDER — ACETAMINOPHEN 325 MG PO TABS
650.0000 mg | ORAL_TABLET | Freq: Once | ORAL | Status: AC
  Administered 2018-12-14: 650 mg via ORAL
  Filled 2018-12-14: qty 2

## 2018-12-14 MED ORDER — SODIUM CHLORIDE 0.9 % IV SOLN
420.0000 mg | Freq: Once | INTRAVENOUS | Status: AC
  Administered 2018-12-14: 420 mg via INTRAVENOUS
  Filled 2018-12-14: qty 14

## 2018-12-14 MED ORDER — CHLORHEXIDINE GLUCONATE CLOTH 2 % EX PADS
6.0000 | MEDICATED_PAD | Freq: Every day | CUTANEOUS | Status: DC
  Administered 2018-12-14: 6 via TOPICAL

## 2018-12-14 MED ORDER — TRAZODONE HCL 50 MG PO TABS: 50.0000 mg | ORAL_TABLET | Freq: Every evening | ORAL | 1 refills | Status: DC | PRN

## 2018-12-14 MED ORDER — POLYETHYLENE GLYCOL 3350 17 G PO PACK: 17.0000 g | PACK | Freq: Every day | ORAL | 0 refills | Status: DC | PRN

## 2018-12-14 MED ORDER — PREDNISONE 10 MG PO TABS: ORAL_TABLET | ORAL | 0 refills | Status: DC

## 2018-12-14 MED ORDER — SODIUM CHLORIDE 0.9 % IV SOLN
Freq: Once | INTRAVENOUS | Status: AC
  Administered 2018-12-14: 13:00:00 via INTRAVENOUS

## 2018-12-14 MED ORDER — TRASTUZUMAB-DKST CHEMO 150 MG IV SOLR
600.0000 mg | Freq: Once | INTRAVENOUS | Status: AC
  Administered 2018-12-14: 600 mg via INTRAVENOUS
  Filled 2018-12-14: qty 28.57

## 2018-12-14 NOTE — Progress Notes (Signed)
Chemotherapy independently checked with Lottie Dawson, RN based on patients BSA and normal dosing of Halaven, Perjeta, and Ogivri.

## 2018-12-14 NOTE — Progress Notes (Addendum)
HEMATOLOGY-ONCOLOGY PROGRESS NOTE  SUBJECTIVE: Neck pain is much better.  She seems to be having increased pain in her sternum.  She is receiving radiation to the sternum, pelvis, and sacrum.  SRS to the neck is planned for next week.  She is doing well overall with oral pain medication but typically requires a dose of IV pain medication after she first wakes up in the morning.  Denies abdominal pain, nausea, vomiting.  No diarrhea.  She had a repeat echocardiogram performed 12/13/2018 which showed a left ventricular ejection fraction of 60 to 65%.  Chemotherapy is planned for 12 PM today.  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, estrogen receptor negative ([place])  10/20/2016 Mammogram   Mammogram and ultrasound of the left breast revealed 1.7 cm mass at 4:00 position, 6:30 position 5 x 4 x 4 mm mass, 6:00 position 5 cm nipple 7 x 6 x 11 mm, left axillary lymph node with thickened cortex, T1c N1 stage II a AJCC 8   10/24/2016 Initial Diagnosis   Left breast biopsy 6:30 position 3 cm from nipple: IDC grade 2, DCIS, ER 0%, PR 0%, Ki-67 15% HER-2 positive ratio 2.1; 4:00 position 3 cm from nipple: IDC grade 2, DCIS, ER 0%, PR 0%, Ki-67 35%, HER-2 positive ratio 2.02; left axillary lymph node biopsy positive   11/04/2016 - 02/17/2017 Neo-Adjuvant Chemotherapy   TCH Perjeta 6 cycles followed by Herceptin + Perjeta maintenance to be completed September 2019   11/30/2016 Genetic Testing   Negative genetic testing on the common hereditary cancer panel.  The Hereditary Gene Panel offered by [facility] includes sequencing and/or deletion duplication testing of the following 47 genes: APC, ATM, AXIN2, BARD1, BMPR1A, BRCA1, BRCA2, BRIP1, CDH1, CDK4, CDKN2A (p14ARF), CDKN2A (p16INK4a), CHEK2, CTNNA1, DICER1, EPCAM (Deletion/duplication testing only), GREM1 (promoter region deletion/duplication testing only), KIT, MEN1, MLH1, MSH2, MSH3, MSH6, MUTYH, NBN, NF1, NHTL1, PALB2,  PDGFRA, PMS2, POLD1, POLE, PTEN, RAD50, RAD51C, RAD51D, SDHB, SDHC, SDHD, SMAD4, SMARCA4. STK11, TP53, TSC1, TSC2, and VHL.  The following genes were evaluated for sequence changes only: SDHA and HOXB13 c.251G>A variant only. The report date is November 30, 2016.    03/27/2017 Surgery   Bilateral mastectomies: Left mastectomy: IDC grade 2 0.9 cm, nodes negative, right mastectomy benign, ER 0%, PR 0%, HER-2 positive ratio 2.6   05/08/2017 - 06/09/2017 Radiation Therapy   Adjuvant radiation therapy   10/23/2017 Miscellaneous   Neratinib discontinued after 4 weeks for severe diarrhea   07/25/2018 Relapse/Recurrence   MRI of right elbow showed bone lesion consistent with malignancy. PET scan showed bilateral pulmonary nodules and several lytic bone lesions compatible with metastatic disease. Brain MRI on 08/02/18 showed no evidence of metastatic disease.   08/02/2018 PET scan   Bilateral hypermetabolic lung nodules, LUL 1.3 cm with SUV 3.88, lingular nodule 1.4 cm SUV 3.7, central lingular nodule 1.2 cm SUV 9.76, right middle lobe nodule 1.5 cm SUV 9.9, lytic bone metastases inferior pubic ramus, sacrum, T12, right 11th rib.   08/08/2018 Procedure   Lung biopsy: metastatic carcinoma, HER-2 negative (0), ER/PR negative.   08/10/2018 -  Radiation Therapy   Palliative radiatio to the right humerus along the medial condyle   08/24/2018 - 09/05/2018 Radiation Therapy   Palliative radiation to the right 11th rib and right elbow   09/26/2018 - 12/04/2018 Chemotherapy   Carboplatin atezolizumab at [facility] with Dr. Janan Halter on [place] clinical trial stopped because of new T5 metastases (toxicities  included myopathy required prednisone, immune mediated thyroiditis), right upper extremity DVT on apixaban   12/14/2018 -  Chemotherapy   The patient had eriBULin mesylate (HALAVEN) 2.65 mg in sodium chloride 0.9 % 100 mL chemo infusion, 1.4 mg/m2 = 2.65 mg, Intravenous,  Once, 1 of 4 cycles  for chemotherapy  treatment.    Bone metastases ([place])  08/07/2018 Initial Diagnosis   Bone metastases ([place])   12/14/2018 -  Chemotherapy   The patient had pertuzumab (PERJETA) 420 mg in sodium chloride 0.9 % 250 mL chemo infusion, 420 mg (100 % of original dose 420 mg), Intravenous, Once, 1 of 6 cycles Dose modification: 420 mg (original dose 420 mg, Cycle 1, Reason: Provider Judgment) trastuzumab-dkst (OGIVRI) 609 mg in sodium chloride 0.9 % 250 mL chemo infusion, 8 mg/kg = 609 mg, Intravenous,  Once, 1 of 6 cycles  for chemotherapy treatment.      OBJECTIVE: REVIEW OF SYSTEMS:   Pain in the neck, sternum, and back.  Otherwise all of the systems negative  PHYSICAL EXAMINATION: ECOG PERFORMANCE STATUS: 2 - Symptomatic, <50% confined to bed  Vitals:   12/13/18 1614 12/14/18 0453  BP: 103/78 (!) 107/59  Pulse: 80 72  Resp: 16 18  Temp: 98.6 F (37 C) 98.5 F (36.9 C)  SpO2: 95% 95%   Filed Weights   12/11/18 0200  Weight: 169 lb 11.2 oz (77 kg)    No pertinent physical exam findings LABORATORY DATA:  I have reviewed the data as listed CMP Latest Ref Rng & Units 12/14/2018 12/13/2018 12/12/2018  Glucose 70 - 99 mg/dL 99 90 96  BUN 6 - 20 mg/dL 19 17 21(H)  Creatinine 0.44 - 1.00 mg/dL 0.65 0.74 0.69  Sodium 135 - 145 mmol/L 137 136 136  Potassium 3.5 - 5.1 mmol/L 3.9 3.3(L) 3.1(L)  Chloride 98 - 111 mmol/L 100 98 100  CO2 22 - 32 mmol/L 27 28 26   Calcium 8.9 - 10.3 mg/dL 8.2(L) 8.5(L) 8.7(L)  Total Protein 6.5 - 8.1 g/dL - - -  Total Bilirubin 0.3 - 1.2 mg/dL - - -  Alkaline Phos 38 - 126 U/L - - -  AST 15 - 41 U/L - - -  ALT 0 - 44 U/L - - -    Lab Results  Component Value Date   WBC 7.3 12/14/2018   HGB 10.0 (L) 12/14/2018   HCT 31.7 (L) 12/14/2018   MCV 110.8 (H) 12/14/2018   PLT 303 12/14/2018   NEUTROABS 8.9 (H) 12/09/2018    ASSESSMENT AND PLAN: Metastatic breast cancer with bone metastases.  Pain is controlled with MS Contin and oxycodone as needed.  She is still  requiring 1 dose of IV pain medication daily typically in the morning when she first wakes up.  Labs have been reviewed and are adequate for treatment.  Cardiogram shows a normal left ventricular ejection fraction.  She will proceed with Halaven, trastuzumab, and Pertuzumab today as scheduled.  We can consider adding a bisphosphonate as an outpatient.  The patient has requested a refill of trazodone to help her sleep at night for when she goes home.  I have sent this refill to her pharmacy.  The patient states that she would like to go home and is inquiring as to whether she can go home later today after chemotherapy.  Will defer this discussion to Dr. Lindi Adie.  Mikey Bussing, DNP, AGPCNP-BC, AOCNP  Attending Note  I personally saw the patient, reviewed the chart and examined the patient.  The plan of care was discussed with the patient and the admitting team. I agree with the assessment and plan as documented above. Thank you very much for the consultation. 1.  Metastatic breast cancer: Received first dose of Halaven with Herceptin and Perjeta. 2.  Neck and sternal pain: Continue with palliative radiation. 3.  I believe patient can be discharged home after the chemotherapy. We will see her next week for her day 8 of Halaven and to see how she is doing. Thank you very much

## 2018-12-14 NOTE — Discharge Summary (Signed)
° °Physician Discharge Summary  °Laura Anthony MRN:[id number] DOB: 12/30/1975 DOA: 12/09/2018 ° °PCP: Badger, [name], MD ° °Admit date: 12/09/2018 °Discharge date: 12/14/2018  ° °Admitted From: Home °Disposition: Home ° °Recommendations for Outpatient Follow-up:  °1. Follow up with PCP in 1-2 weeks °2. Oncology to arrange outpatient follow-up °3. Please obtain CBC/BMP/Mag at follow up °4. Please follow up on the following pending results: None ° °Home Health: None needed °Equipment/Devices: 3 in 1 commode and walker ° °Discharge Condition: Stable °CODE STATUS: Full code ° °Follow-up Information   ° Badger, [name], MD. Schedule an appointment as soon as possible for a visit in 1 week(s).   °Specialty: Family Medicine °Contact information: °[address] °[place] [address] °[phone] ° ° °  °  °  ° ° ° °Hospital Course: °Patient is a 43-year-old female with history of metastatic breast CA, provoked DVT of the right upper extremity on Eliquis, hyperlipidemia, right shoulder rotator cuff impingement syndrome, presented with neck pain.  Patient reported 1 day history of severe right-sided neck pain, no fall or any injuries to her neck.  Patient also reported her face appears swollen and little red.  No focal weakness or paresthesias of the extremities.  No dyspnea or dysphagia.  Patient has breast CA with metastasis to several bones, enrolled in the clinical trial at [facility].  Recent scans done at [facility] revealed that there was progression of the disease. °CT soft tissue neck showed lytic C3 vertebral body metastasis, broken through the posterior cortex but no epidural tumor evident on CT.  CT chest showed new narrowing of the right brachiocephalic vein, suggestive of right brachiocephalic vein stenosis, SVC widely patent to cavoatrial junction °  °Patient was admitted at [facility], was evaluated by neurosurgery, placed on Aspen collar.  MRI C-spine revealed metastatic disease in the C3  vertebral body, left transverse process of C7 and T1 with no obvious central or foraminal stenosis, no surgical intervention at this time and recommended stereotactic radiosurgery neurosurgery.  °Patient also complained of right groin pain on ambulation.  Right hip and pelvic x-ray showed linear lucency of the right inferior pubic rami may indicate nondisplaced fracture, no fracture or dislocation of the right hip.   °  °Patient was also seen by oncology, radiation oncology, transferred to [place] for simulation and chemo-radiation therapy. ° °Patient was started on palliative chemotherapy for osseous lesions.  She was also started on chemotherapy.  Pain improved after radiation therapy.  Cleared for discharge by oncology.  Oncology to arrange outpatient follow-up for further chemoradiation therapy.  ° °See individual problem list below for more on hospital course. ° °Subjective: °No major events overnight of this morning.  Pain improved significantly.  Excited about going home.  Cleared by oncology.  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 °-Follows Dr. Gudena and involved in clinical trial at [facility]. °-MRI of the C-spine confirmed mets C3, left TP of C7 and T1 with no central or foraminal stenosis. °-CT pelvis with 2 cm rounded lucency in right inferior pubic ramus and 2.4 cm bilobed lucency in sacrum concerning for lytic lesion °-Neurosurgery consulted and recommended XRT.  Soft C-collar okay if needed. °-Started palliative XRT for sternal, sacral & pelvic lesion 12/13/2018, and SRS tx for T3 & C7-T1 lesion next Wednesday. °-Echocardiogram not impressive.  Started chemotherapy 12/14/2018 °-Oncology and radiation oncology   to arrange outpatient follow-up  for further therapy. °-Continued steroid taper. °-Continue scheduled MS Contin and as needed oxycodone for pain control °-Aggressive bowel regimen. °  °Brachiocephalic vein stenosis/facial swelling °-CT chest showed new narrowing of the right brachiocephalic vein about the port catheter tubing with new prominent small internal mammary and chest wall venous collaterals about the chest suggestive of right brachiocephalic vein stenosis, SVC itself is widely patent. °-Dr. Vann discussed with Dr. Hassell, recommended no intervention at this time.  There is stenosis but left IJ is patent and collateral venous drainage will be slow but suffice, no clot burden to be dealt with at this time. °  °History of provoked RUE DVT in the setting of metastatic breast CA °-Continue Eliquis for anticoagulation °  °Macrocytic anemia, anemia of chronic disease: Likely due to recent chemo and underlying malignancy °-H&H currently stable °-Recheck CBC at follow-up °  °Depression/insomnia °-Continue Prozac, mirtazapine and trazodone °  °Hypothyroidism °-Continue Synthroid °  °Hypokalemia: Resolved. °-Recheck BMP at follow-up ° ° °Discharge Instructions ° °Discharge Instructions   ° Call MD for:  difficulty breathing, headache or visual disturbances   Complete by: As directed °  ° Call MD for:  extreme fatigue   Complete by: As directed °  ° Call MD for:  persistant dizziness or light-headedness   Complete by: As directed °  ° Call MD for:  persistant nausea and vomiting   Complete by: As directed °  ° Call MD for:  severe uncontrolled pain   Complete by: As directed °  ° Call MD for:  temperature >100.4   Complete by: As directed °  ° Diet - low sodium heart healthy   Complete by: As directed °  ° Increase activity slowly   Complete by: As directed °  °  ° °Allergies as of 12/14/2018   °   Reactions  ° Sumatriptan Other (See Comments)  ° Numbness to face  ° Statins Other (See Comments)  ° Leg pain  ° Tape Rash, Other (See Comments)  ° Rash  from dressing over port-a-cath  °  °  °Medication List  °  °STOP taking these medications   °LORazepam 1 MG tablet °Commonly known as: ATIVAN °  °  °TAKE these medications   °acetaminophen 500 MG tablet °Commonly known as: TYLENOL °Take 1,000 mg by mouth every 8 (eight) hours. °  °cyclobenzaprine 5 MG tablet °Commonly known as: FLEXERIL °Take 5 mg by mouth 3 (three) times daily. °  °Eliquis 5 MG Tabs tablet °Generic drug: apixaban °Take 5 mg by mouth 2 (two) times daily. °  °FLUoxetine 40 MG capsule °Commonly known as: PROZAC °Take 40 mg by mouth every morning. °  °gabapentin 300 MG capsule °Commonly known as: NEURONTIN °Take 300 mg by mouth every evening. °  °levothyroxine 88 MCG tablet °Commonly known as: Synthroid °Take 1 tablet (88 mcg total) by mouth daily before breakfast. °  °lidocaine-prilocaine cream °Commonly known as: EMLA °APPLY 1 APPLICATION TOPICALLY AS NEEDED. °What changed:  °· reasons to take this °· Another medication with the same name was removed. Continue taking this medication, and follow the directions you see here. °  °lisdexamfetamine 30 MG capsule °Commonly known as: VYVANSE °Take 30 mg by mouth every morning. °  °mirtazapine 15 MG tablet °Commonly known as: REMERON °Take 15 mg by mouth at bedtime. °  °morphine 30 MG 12 hr tablet °Commonly known as: MS CONTIN °Take 30 mg by mouth every 12 (twelve) hours. °  °ondansetron 8 MG tablet °Commonly known as: Zofran °Take   1 tablet (8 mg total) by mouth 2 (two) times daily as needed (Nausea or vomiting). °  °Oxycodone HCl 10 MG Tabs °Take 1 tablet (10 mg total) by mouth every 6 (six) hours as needed. °What changed: reasons to take this °  °polyethylene glycol 17 g packet °Commonly known as: MIRALAX / GLYCOLAX °Take 17 g by mouth daily as needed for mild constipation or moderate constipation. °  °predniSONE 10 MG tablet °Commonly known as: DELTASONE °Take 3 tablets (30 mg total) by mouth daily with breakfast for 5 days, THEN 2 tablets (20 mg total)  daily with breakfast for 7 days, THEN 1 tablet (10 mg total) daily with breakfast for 7 days, THEN 0.5 tablets (5 mg total) daily with breakfast for 7 days. °Start taking on: December 15, 2018 °What changed: See the new instructions. °  °prochlorperazine 10 MG tablet °Commonly known as: COMPAZINE °Take 1 tablet (10 mg total) by mouth every 6 (six) hours as needed (Nausea or vomiting). °  °rizatriptan 10 MG tablet °Commonly known as: MAXALT °Take 10 mg by mouth daily as needed for migraine (and may repeat once in 2 hours, if no relief). °  °senna-docusate 8.6-50 MG tablet °Commonly known as: Senokot-S °Take 1 tablet by mouth 2 (two) times daily. °  °spironolactone 100 MG tablet °Commonly known as: ALDACTONE °Take 100 mg by mouth daily. °  °traZODone 50 MG tablet °Commonly known as: DESYREL °Take 1 tablet (50 mg total) by mouth at bedtime as needed for sleep. °  °  °  °  ° °  °Durable Medical Equipment  °(From admission, onward)  °  ° °  °  Start     Ordered  ° 12/14/18 0738  For home use only DME 4 wheeled rolling walker with seat  Once    °Question:  Patient needs a walker to treat with the following condition  Answer:  Generalized weakness  ° 12/14/18 0737  °  °  °  ° ° °Consultations: °· Oncology °· Radiation oncology °· Neurosurgery ° °Procedures/Studies: °· 2D Echo on 12/13/2018 °1. Left ventricular ejection fraction, by visual estimation, is 60 to 65%. The left ventricle has normal function. There is no left ventricular hypertrophy. ° 2. Indeterminate diastolic filling due to E-A fusion. ° 3. The left ventricle has no regional wall motion abnormalities. ° 4. Global right ventricle has normal systolic function.The right ventricular size is normal. No increase in right ventricular wall thickness. ° 5. Left atrial size was normal. ° 6. Right atrial size was normal. ° 7. The mitral valve is grossly normal. No evidence of mitral valve regurgitation. ° 8. The tricuspid valve is grossly normal. Tricuspid valve  regurgitation is not demonstrated. ° 9. The aortic valve is tricuspid. Aortic valve regurgitation is not visualized. No evidence of aortic valve sclerosis or stenosis. °10. The pulmonic valve was grossly normal. Pulmonic valve regurgitation is not visualized. °11. TR signal is inadequate for assessing pulmonary artery systolic pressure. °12. The inferior vena cava is normal in size with greater than 50% respiratory variability, suggesting right atrial pressure of 3 mmHg ° °· Radiation therapy on 12/13/2018 ° °· Chemotherapy on 12/14/2018 ° °Ct Soft Tissue Neck W Contrast ° °Result Date: 12/09/2018 °CLINICAL DATA:  43-year-old female with metastatic breast cancer and increasing neck pain and swelling. Currently on chemotherapy. Query as feces syndrome. EXAM: CT NECK WITH CONTRAST TECHNIQUE: Multidetector CT imaging of the neck was performed using the standard protocol following the bolus administration of intravenous   intravenous contrast. CONTRAST:  156m OMNIPAQUE IOHEXOL 300 MG/ML SOLN in conjunction with contrast enhanced imaging of the chest reported separately. COMPARISON:  Chest CT today reported separately. Brain MRI 08/02/2018. FINDINGS: Pharynx and larynx: The glottis is closed. Laryngeal and pharyngeal soft tissue contours are within normal limits. Negative parapharyngeal spaces. Negative retropharyngeal space. Salivary glands: Negative sublingual space. Submandibular glands and parotid glands are within normal limits. Thyroid: Diminutive, negative. Lymph nodes: No cervical lymphadenopathy. Diminutive bilateral cervical nodes. Vascular: The bilateral internal jugular veins are patent. The left IJ is dominant but there is a right IJ approach porta cath in place. The visible bilateral subclavian veins are patent. 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 See also chest CT findings today reported separately. In the neck and  at the skull base the major vascular structures are patent. Limited intracranial: Negative. Visualized orbits: Negative. Mastoids and visualized paranasal sinuses: Clear. Skeleton: Lytic metastasis in the C3 vertebral body is eccentric to the right and breaks through the posterior cortex (series 14, image 49 and sagittal image 48. No obvious epidural extension. There are patent epidural veins overlying the lesion. No other osseous metastasis identified in the neck. Upper chest: Reported separately today. IMPRESSION: 1. Patent major vascular structures in the neck and at the skull base. 2. 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 The See CT Chest today reported separately. 3. Lytic C3 vertebral body metastasis which has broken through the posterior cortex, but no epidural tumor is evident by CT. 4. No other metastatic disease identified in the neck. Electronically Signed   By: HGenevie AnnM.D.   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 CONTRAST:  1020mOMNIPAQUE IOHEXOL 300 MG/ML  SOLN COMPARISON:  PET-CT, 08/02/2018 FINDINGS: Cardiovascular: Interval placement of a right chest port catheter. There is new narrowing of the right brachiocephalic vein about the port catheter tubing (series 9, image 46) with new prominent small internal mammary and chest wall venous collaterals about the chest. Normal heart size. No pericardial effusion. Mediastinum/Nodes: No enlarged mediastinal, hilar, or axillary lymph nodes. Thyroid gland, trachea, and esophagus demonstrate no significant findings. Lungs/Pleura: There are numerous bilateral pulmonary nodules of varying sizes, a number of dominant  nodule seen on initial staging examination dated 08/02/2018 not significantly changed in size, however with numerous new small pulmonary nodules throughout the lungs, for example a 5 mm nodule in the left lower lobe (series 11, image 86) and adjacent 2-3 mm nodules of the right lower lobe (series 11, image 113). No pleural effusion or pneumothorax. Upper Abdomen: No acute abnormality. Hepatic steatosis. Unchanged small left adrenal nodule, partially imaged. Musculoskeletal: Status post bilateral mastectomy and implant reconstruction. IMPRESSION: 1. Interval placement of a right chest port catheter. There is new narrowing of the right brachiocephalic vein about the port catheter tubing (series 9, image 46) with new prominent small internal mammary and chest wall venous collaterals about the chest. 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 The superior vena cava itself is widely patent to the cavoatrial junction. A specifically tailored multiphasic CT central venogram may be helpful to further assess  functional degree of stenosis and diagnostic/therapeutic interventional venogram can be considered. 2. There are numerous bilateral pulmonary nodules of varying sizes, a number of dominant nodule seen on initial staging examination dated 08/02/2018 not significantly changed in size, however with numerous new small pulmonary nodules throughout the lungs, for example a 5 mm nodule in the left lower lobe (series 11, image 86) and adjacent 2-3 mm nodules of the right lower lobe (series 11, image 113). Findings are consistent with worsened pulmonary metastatic disease. Electronically Signed   By: Eddie Candle M.D.   On: 12/09/2018 19:24   Ct Pelvis Wo Contrast  Result Date: 12/11/2018 CLINICAL DATA:  Abnormal x-ray. EXAM: CT PELVIS WITHOUT CONTRAST TECHNIQUE: Multidetector CT imaging of the pelvis was performed following the standard protocol without intravenous contrast. COMPARISON:  December 10, 2018. FINDINGS: Urinary Tract:  No abnormality visualized. Bowel:  Unremarkable visualized pelvic bowel loops. Vascular/Lymphatic: No pathologically enlarged lymph nodes. No significant vascular abnormality seen. Reproductive: Uterus is unremarkable. No adnexal abnormality is noted. Other:  None. Musculoskeletal: Rounded lucency is noted in right inferior pubic ramus which measures 2 cm in length concerning for lytic lesion. There is no definite evidence of fracture. Also noted is bilobed lucency in the sacrum that measures 2.4 cm concerning for metastatic lesion. IMPRESSION: 2 cm rounded lucency seen in right inferior pubic ramus concerning for lytic lesion or metastatic lesion. Also noted is bilobed lucency in the sacrum measuring 2.4 cm, also concerning for metastatic disease. Bone scan may be performed for further evaluation. Electronically Signed   By: Marijo Conception M.D.   On: 12/11/2018 12:36   Mr Cervical Spine Wo Contrast  Result Date: 12/10/2018 CLINICAL DATA:  Onset severe right neck pain 2-3 days ago. History of metastatic breast cancer. EXAM: MRI CERVICAL SPINE WITHOUT CONTRAST TECHNIQUE: Multiplanar, multisequence MR imaging of the cervical spine was performed. No intravenous contrast was administered. COMPARISON:  None. FINDINGS: Alignment: Maintained. Vertebrae: There is a lesion in the right side of the C3 vertebral body measuring approximately 1.3 cm AP x 1.3 cm transverse x 1.2 cm craniocaudal highly suspicious for a metastatic deposit. T2 hyperintensity is also seen in the left transverse processes of C7 and T1 worrisome for metastatic disease. No fracture. Cord: Normal signal throughout. Posterior Fossa, vertebral arteries, paraspinal tissues: Negative. Disc levels: C2-3: Mild disc bulge without stenosis. C3-4: The posterior margin of C3 on the right is mildly expanded by tumor deposit but the central canal and foramina appear open. No disc bulge or protrusion. C4-5: Minimal disc bulge  without stenosis. C5-6: There is a shallow disc bulge and some uncovertebral disease. No stenosis. C6-7: Negative. C7-T1: Negative. IMPRESSION: Findings most consistent with metastatic disease in the C3 vertebral body and left transverse processes of C7 and T1. The posterior margin of C3 is mildly expanded but no central canal or foraminal stenosis is identified. Postcontrast imaging to evaluate for epidural tumor extension is recommended. Whole-body bone scan could be used to evaluate for additional sites of metastatic disease. These results will be called to the ordering clinician or representative by the Radiologist Assistant, and communication documented in the PACS or zVision Dashboard. Electronically Signed   By: Inge Rise M.D.   On: 12/10/2018 05:38   Mr Cervical Spine W Wo Contrast  Result Date: 12/11/2018 CLINICAL DATA:  Abnormal noncontrast cervical spine study EXAM: MRI CERVICAL SPINE WITHOUT AND WITH CONTRAST TECHNIQUE: Multiplanar and multiecho pulse sequences of the cervical spine, to include the craniocervical junction and  cervicothoracic junction, were obtained without and with intravenous contrast. CONTRAST:  49m GADAVIST GADOBUTROL 1 MMOL/ML IV SOLN COMPARISON:  Cervical spine MRI December 10, 2018 FINDINGS: Alignment: Stable. Vertebrae: Stable vertebral body heights. Abnormal STIR hyperintensity enhancement is again identified within the C3 vertebral body eccentric to the right. There is mild right ventral epidural extension partially effacing the subarachnoid space. Previously questioned T2 hyperintensity involving the left transverse processes of C7 and T1 cannot be definitively characterized as metastatic disease as this area is not covered on STIR or postcontrast imaging. Therefore, this could reflect fat signal rather than edema. Cord: No abnormal cord signal. Posterior Fossa, vertebral arteries, paraspinal tissues: Unremarkable. Disc levels: No change since 1 day prior. No  significant degenerative stenosis. IMPRESSION: Metastatic lesion at C3 with mild right ventral epidural extension partially effacing the subarachnoid space. No cord compression. Previously questioned left C7 and T1 transverse process lesions cannot be definitively characterized metastases as this area incompletely included on this study. Electronically Signed   By: PMacy MisM.D.   On: 12/11/2018 16:12   Dg Hip Unilat With Pelvis 2-3 Views Right  Result Date: 12/10/2018 CLINICAL DATA:  Right hip pain EXAM: DG HIP (WITH OR WITHOUT PELVIS) 2-3V RIGHT COMPARISON:  None. FINDINGS: No fracture or dislocation at the right hip. There is a linear lucency of the right inferior pubic ramus without displacement. The visualized pelvic and left hip osseous structures are normal otherwise. IMPRESSION: Linear lucency of the right inferior pubic ramus may indicate a nondisplaced fracture (CT would be more conclusive). No fracture or dislocation of the right hip. Electronically Signed   By: KUlyses JarredM.D.   On: 12/10/2018 19:27       Discharge Exam: Vitals:   12/13/18 1614 12/14/18 0453  BP: 103/78 (!) 107/59  Pulse: 80 72  Resp: 16 18  Temp: 98.6 F (37 C) 98.5 F (36.9 C)  SpO2: 95% 95%    GENERAL: No acute distress.  Appears well.  HEENT: MMM.  Vision and hearing grossly intact.  NECK: Supple.  No apparent JVD.  RESP:  No IWOB. Good air movement bilaterally. CVS:  RRR. Heart sounds normal.  ABD/GI/GU: Bowel sounds present. Soft. Non tender.  MSK/EXT:  Moves extremities. No apparent deformity or edema.  Some tenderness over sternum SKIN: no apparent skin lesion or wound NEURO: Awake, alert and oriented appropriately.  No gross deficit.  PSYCH: Calm. Normal affect.   The results of significant diagnostics from this hospitalization (including imaging, microbiology, ancillary and laboratory) are listed below for reference.     Microbiology: Recent Results (from the past 240 hour(s))    SARS CORONAVIRUS 2 (TAT 6-24 HRS) Nasopharyngeal Nasopharyngeal Swab     Status: None   Collection Time: 12/09/18  8:24 PM   Specimen: Nasopharyngeal Swab  Result Value Ref Range Status   SARS Coronavirus 2 NEGATIVE NEGATIVE Final    Comment: (NOTE) SARS-CoV-2 target nucleic acids are NOT DETECTED. The SARS-CoV-2 RNA is generally detectable in upper and lower respiratory specimens during the acute phase of infection. Negative results do not preclude SARS-CoV-2 infection, do not rule out co-infections with other pathogens, and should not be used as the sole basis for treatment or other patient management decisions. Negative results must be combined with clinical observations, patient history, and epidemiological information. The expected result is Negative. Fact Sheet for Patients: hSugarRoll.beFact Sheet for Healthcare Providers: hhttps://www.woods-mathews.com/This test is not yet approved or cleared by the [place] and  has  authorized for detection and/or diagnosis of SARS-CoV-2 by °FDA under an Emergency Use Authorization (EUA). This EUA will remain  °in effect (meaning this test can be used) for the duration of the °COVID-19 declaration under Section 56 °4(b)(1) of the Act, 21 U.S.C. °section 360bbb-3(b)(1), unless the authorization is terminated or °revoked sooner. °Performed at [facility], [address] [place], [place] °[address] °  °  ° °Labs: °BNP (last 3 results) °No results for input(s): BNP in the last 8760 hours. °Basic Metabolic Panel: °Recent Labs  °Lab 12/09/18 °1727 12/12/18 °0529 12/13/18 °0439 12/14/18 °0546  °NA 135 136 136 137  °K 4.2 3.1* 3.3* 3.9  °CL 96* 100 98 100  °CO2 29 26 28 27  °GLUCOSE 119* 96 90 99  °BUN 16 21* 17 19  °CREATININE 0.74 0.69 0.74 0.65  °CALCIUM 9.3 8.7* 8.5* 8.2*  °MG  --   --  2.2 2.4  ° °Liver Function Tests: °Recent Labs  °Lab 12/09/18 °1727  °AST 31  °ALT 54*  °ALKPHOS 86  °BILITOT  0.9  °PROT 7.0  °ALBUMIN 3.4*  ° °No results for input(s): LIPASE, AMYLASE in the last 168 hours. °No results for input(s): AMMONIA in the last 168 hours. °CBC: °Recent Labs  °Lab 12/09/18 °1727 12/12/18 °0529 12/14/18 °0546  °WBC 10.1 8.3 7.3  °NEUTROABS 8.9*  --   --   °HGB 10.9* 11.0* 10.0*  °HCT 32.9* 35.5* 31.7*  °MCV 106.1* 109.2* 110.8*  °PLT 232 296 303  ° °Cardiac Enzymes: °No results for input(s): CKTOTAL, CKMB, CKMBINDEX, TROPONINI in the last 168 hours. °BNP: °Invalid input(s): POCBNP °CBG: °No results for input(s): GLUCAP in the last 168 hours. °D-Dimer °No results for input(s): DDIMER in the last 72 hours. °Hgb A1c °No results for input(s): HGBA1C in the last 72 hours. °Lipid Profile °No results for input(s): CHOL, HDL, LDLCALC, TRIG, CHOLHDL, LDLDIRECT in the last 72 hours. °Thyroid function studies °No results for input(s): TSH, T4TOTAL, T3FREE, THYROIDAB in the last 72 hours. ° °Invalid input(s): FREET3 °Anemia work up °No results for input(s): VITAMINB12, FOLATE, FERRITIN, TIBC, IRON, RETICCTPCT in the last 72 hours. °Urinalysis °No results found for: COLORURINE, APPEARANCEUR, LABSPEC, PHURINE, GLUCOSEU, HGBUR, BILIRUBINUR, KETONESUR, PROTEINUR, UROBILINOGEN, NITRITE, LEUKOCYTESUR °Sepsis Labs °Invalid input(s): PROCALCITONIN,  WBC,  LACTICIDVEN ° ° °Time coordinating discharge: 45 minutes ° °SIGNED: ° °Taye T Gonfa, MD  °[facility] °12/14/2018, 2:08 PM ° °If 7PM-7AM, please contact night-coverage [email or web address] °Password [email or web address] °

## 2018-12-14 NOTE — TOC Transition Note (Signed)
Transition of Care [facility]) - CM/SW Discharge Note   Patient Details  Name: Laura Anthony MRN: [id number] Date of Birth: 1975-10-28  Transition of [facility]) CM/SW Contact:  Jerrie Schussler, Marjie Skiff, RN Phone Number:[phone] 12/14/2018, 2:14 PM   Clinical Narrative:     Adapt liaison informed of need for Rollator and 3in1.

## 2018-12-17 ENCOUNTER — Ambulatory Visit: Payer: 59

## 2018-12-17 ENCOUNTER — Encounter: Payer: Self-pay | Admitting: *Deleted

## 2018-12-17 ENCOUNTER — Ambulatory Visit
Admission: RE | Admit: 2018-12-17 | Discharge: 2018-12-17 | Disposition: A | Discharge status: Home / Self Care | Payer: 59 | Source: Ambulatory Visit | Attending: Radiation Oncology | Admitting: Radiation Oncology

## 2018-12-17 ENCOUNTER — Other Ambulatory Visit: Payer: Self-pay

## 2018-12-17 DIAGNOSIS — C50512 Malignant neoplasm of lower-outer quadrant of left female breast: Secondary | ICD-10-CM | POA: Insufficient documentation

## 2018-12-17 DIAGNOSIS — Z171 Estrogen receptor negative status [ER-]: Secondary | ICD-10-CM | POA: Insufficient documentation

## 2018-12-17 DIAGNOSIS — C7951 Secondary malignant neoplasm of bone: Secondary | ICD-10-CM | POA: Insufficient documentation

## 2018-12-17 DIAGNOSIS — Z510 Encounter for antineoplastic radiation therapy: Secondary | ICD-10-CM | POA: Diagnosis not present

## 2018-12-18 ENCOUNTER — Ambulatory Visit
Admission: RE | Admit: 2018-12-18 | Discharge: 2018-12-18 | Disposition: A | Discharge status: Home / Self Care | Payer: 59 | Source: Ambulatory Visit | Attending: Radiation Oncology | Admitting: Radiation Oncology

## 2018-12-18 ENCOUNTER — Other Ambulatory Visit: Payer: Self-pay

## 2018-12-18 ENCOUNTER — Ambulatory Visit: Payer: 59

## 2018-12-18 DIAGNOSIS — C7951 Secondary malignant neoplasm of bone: Secondary | ICD-10-CM | POA: Diagnosis not present

## 2018-12-19 ENCOUNTER — Ambulatory Visit: Payer: 59

## 2018-12-19 ENCOUNTER — Ambulatory Visit
Admission: RE | Admit: 2018-12-19 | Discharge: 2018-12-19 | Disposition: A | Discharge status: Home / Self Care | Payer: 59 | Source: Ambulatory Visit | Attending: Radiation Oncology | Admitting: Radiation Oncology

## 2018-12-19 ENCOUNTER — Other Ambulatory Visit: Payer: Self-pay

## 2018-12-19 DIAGNOSIS — C7951 Secondary malignant neoplasm of bone: Secondary | ICD-10-CM | POA: Diagnosis not present

## 2018-12-19 NOTE — Procedures (Signed)
   Name: Laura Anthony  MRN: [id number]  Date: 12/19/2018   DOB: Dec 05, 1975  Stereotactic Radiosurgery Operative Note  PRE-OPERATIVE DIAGNOSIS:  Spinal Metastasis  POST-OPERATIVE DIAGNOSIS:  Spinal Metastasis  PROCEDURE:  Stereotactic Radiosurgery  SURGEON:  Tong Pieczynski P, MD  NARRATIVE: The patient underwent a radiation treatment planning session in the radiation oncology simulation suite under the care of the radiation oncology physician and physicist.  I participated closely in the radiation treatment planning afterwards. The patient underwent planning CT myelogram which was fused to the MRI.  These images were fused on the planning system.  Radiation oncology contoured the gross target volume and subsequently expanded this to yield the Planning Target Volume. I actively participated in the planning process.  I helped to define and review the target contours and also the contours of the spinal cord, and selected nearby organs at risk.  All the dose constraints for critical structures were reviewed and compared to AAPM Task Group 101.  The prescription dose conformity was reviewed.  I approved the plan electronically.    Accordingly, Laura Anthony was brought to the TrueBeam stereotactic radiation treatment linac and placed in the custom immobilization device.  The patient was aligned according to the IR fiducial markers with BrainLab Exactrac, then orthogonal x-rays were used in ExacTrac with the 6DOF robotic table and the shifts were made to align the patient.  Then conebeam CT was performed to verify precision.  Laura Anthony received stereotactic radiosurgery uneventfully.  The detailed description of the procedure is recorded in the radiation oncology procedure note.  I was present for the duration of the procedure.  DISPOSITION:  Following delivery, the patient was transported to nursing in stable condition and monitored for possible acute effects to be  discharged to home in stable condition with follow-up in one month.  Neldon Shepard P, MD 12/19/2018 11:01 AM

## 2018-12-19 NOTE — Progress Notes (Signed)
Nurse monitoring following SRS treatment complete. Patient denies any development of new neurologic symptoms. Instructed patient to avoid strenuous activity for the next 24 hours. Also, instructed patient to phone [phone] and request the radiation oncologist with new symptoms or concerns related to today's treatment. Patient verbalized understanding. Patient exited clinic in no distress ambulatory with a steady gait.

## 2018-12-20 ENCOUNTER — Other Ambulatory Visit: Payer: Self-pay

## 2018-12-20 ENCOUNTER — Ambulatory Visit: Payer: 59

## 2018-12-20 ENCOUNTER — Ambulatory Visit
Admission: RE | Admit: 2018-12-20 | Discharge: 2018-12-20 | Disposition: A | Discharge status: Home / Self Care | Payer: 59 | Source: Ambulatory Visit | Attending: Radiation Oncology | Admitting: Radiation Oncology

## 2018-12-20 DIAGNOSIS — C7951 Secondary malignant neoplasm of bone: Secondary | ICD-10-CM | POA: Diagnosis not present

## 2018-12-20 NOTE — Progress Notes (Signed)
Patient Care Team: [name], MD as PCP - General (Family Medicine) Mauro Kaufmann, RN as Oncology Nurse Navigator Rockwell Germany, RN as Oncology Nurse Navigator  DIAGNOSIS:    ICD-10-CM   1. Bone metastases (HCC)  C79.51   2. Malignant neoplasm of lower-outer quadrant of left breast of female, estrogen receptor negative ([place])  C50.512    Z17.1     SUMMARY OF ONCOLOGIC HISTORY: 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, estrogen receptor negative ([place])  10/20/2016 Mammogram   Mammogram and ultrasound of the left breast revealed 1.7 cm mass at 4:00 position, 6:30 position 5 x 4 x 4 mm mass, 6:00 position 5 cm nipple 7 x 6 x 11 mm, left axillary lymph node with thickened cortex, T1c N1 stage II a AJCC 8   10/24/2016 Initial Diagnosis   Left breast biopsy 6:30 position 3 cm from nipple: IDC grade 2, DCIS, ER 0%, PR 0%, Ki-67 15% HER-2 positive ratio 2.1; 4:00 position 3 cm from nipple: IDC grade 2, DCIS, ER 0%, PR 0%, Ki-67 35%, HER-2 positive ratio 2.02; left axillary lymph node biopsy positive   11/04/2016 - 02/17/2017 Neo-Adjuvant Chemotherapy   TCH Perjeta 6 cycles followed by Herceptin + Perjeta maintenance to be completed September 2019   11/30/2016 Genetic Testing   Negative genetic testing on the common hereditary cancer panel.  The Hereditary Gene Panel offered by [facility] includes sequencing and/or deletion duplication testing of the following 47 genes: APC, ATM, AXIN2, BARD1, BMPR1A, BRCA1, BRCA2, BRIP1, CDH1, CDK4, CDKN2A (p14ARF), CDKN2A (p16INK4a), CHEK2, CTNNA1, DICER1, EPCAM (Deletion/duplication testing only), GREM1 (promoter region deletion/duplication testing only), KIT, MEN1, MLH1, MSH2, MSH3, MSH6, MUTYH, NBN, NF1, NHTL1, PALB2, PDGFRA, PMS2, POLD1, POLE, PTEN, RAD50, RAD51C, RAD51D, SDHB, SDHC, SDHD, SMAD4, SMARCA4. STK11, TP53, TSC1, TSC2, and VHL.  The following genes were evaluated for sequence changes only: SDHA  and HOXB13 c.251G>A variant only. The report date is November 30, 2016.    03/27/2017 Surgery   Bilateral mastectomies: Left mastectomy: IDC grade 2 0.9 cm, nodes negative, right mastectomy benign, ER 0%, PR 0%, HER-2 positive ratio 2.6   05/08/2017 - 06/09/2017 Radiation Therapy   Adjuvant radiation therapy   10/23/2017 Miscellaneous   Neratinib discontinued after 4 weeks for severe diarrhea   07/25/2018 Relapse/Recurrence   MRI of right elbow showed bone lesion consistent with malignancy. PET scan showed bilateral pulmonary nodules and several lytic bone lesions compatible with metastatic disease. Brain MRI on 08/02/18 showed no evidence of metastatic disease.   08/02/2018 PET scan   Bilateral hypermetabolic lung nodules, LUL 1.3 cm with SUV 3.88, lingular nodule 1.4 cm SUV 3.7, central lingular nodule 1.2 cm SUV 9.76, right middle lobe nodule 1.5 cm SUV 9.9, lytic bone metastases inferior pubic ramus, sacrum, T12, right 11th rib.   08/08/2018 Procedure   Lung biopsy: metastatic carcinoma, HER-2 negative (0), ER/PR negative.   08/10/2018 -  Radiation Therapy   Palliative radiatio to the right humerus along the medial condyle   08/24/2018 - 09/05/2018 Radiation Therapy   Palliative radiation to the right 11th rib and right elbow   09/26/2018 - 12/04/2018 Chemotherapy   Carboplatin atezolizumab at [facility] with Dr. Janan Halter on [place] clinical trial stopped because of new T5 metastases (toxicities included myopathy required prednisone, immune mediated thyroiditis), right upper extremity DVT on apixaban   12/14/2018 -  Chemotherapy   The patient had eriBULin mesylate (HALAVEN) 2.7 mg in sodium  chloride 0.9 % 100 mL chemo infusion, 1.42 mg/m2 = 2.65 mg, Intravenous,  Once, 1 of 4 cycles Administration: 2.7 mg (12/14/2018)  for chemotherapy treatment.    12/17/2018 -  Radiation Therapy   Palliative radiation to sternal, sacral & pelvic lesions and SRS for T3 and C7-T1 lesions.   Bone  metastases ([place])  08/07/2018 Initial Diagnosis   Bone metastases ([place])   12/14/2018 -  Chemotherapy   The patient had pertuzumab (PERJETA) 420 mg in sodium chloride 0.9 % 250 mL chemo infusion, 420 mg (100 % of original dose 420 mg), Intravenous, Once, 1 of 6 cycles Dose modification: 420 mg (original dose 420 mg, Cycle 1, Reason: Provider Judgment) Administration: 420 mg (12/14/2018) trastuzumab-dkst (OGIVRI) 600 mg in sodium chloride 0.9 % 250 mL chemo infusion, 609 mg, Intravenous,  Once, 1 of 6 cycles Administration: 600 mg (12/14/2018)  for chemotherapy treatment.    12/17/2018 -  Radiation Therapy   Palliative radiation to sternal, sacral & pelvic lesions and SRS for T3 and C7-T1 lesions.     CHIEF COMPLIANT: Follow-up of metastatic breast cancer on Halaven with Herceptin and Perjeta  INTERVAL HISTORY: Laura Anthony is a 43 y.o. with above-mentioned history of metastatic breast cancer with metastases in the lungs and bone. She is currently on treatment with Hal[place] with Herceptin and Perjeta. She is also undergoing palliative radiation to sternal, sacral &pelvic lesions and SRS for T3 and C7-T1 lesions. She was admitted to [facility] from 11/8-11/13 after presenting to the ED for neck pain. She presents to the clinic today for follow-up of her hospitalization and treatment.   REVIEW OF SYSTEMS:   Constitutional: Denies fevers, chills or abnormal weight loss Eyes: Denies blurriness of vision Ears, nose, mouth, throat, and face: Denies mucositis or sore throat Respiratory: Denies cough, dyspnea or wheezes Cardiovascular: Denies palpitation, chest discomfort Gastrointestinal: Denies nausea, heartburn or change in bowel habits Skin: Denies abnormal skin rashes Lymphatics: Denies new lymphadenopathy or easy bruising Neurological: Denies numbness, tingling or new weaknesses Behavioral/Psych: Mood is stable, no new changes  Extremities: No lower extremity edema Breast:  denies any pain or lumps or nodules in either breasts All other systems were reviewed with the patient and are negative.  I have reviewed the past medical history, past surgical history, social history and family history with the patient and they are unchanged from previous note.  ALLERGIES:  is allergic to sumatriptan; statins; and tape.  MEDICATIONS:  Current Outpatient Medications  Medication Sig Dispense Refill   acetaminophen (TYLENOL) 500 MG tablet Take 1,000 mg by mouth every 8 (eight) hours.      ELIQUIS 5 MG TABS tablet Take 5 mg by mouth 2 (two) times daily.     FLUoxetine (PROZAC) 40 MG capsule Take 40 mg by mouth every morning.      gabapentin (NEURONTIN) 300 MG capsule Take 300 mg by mouth every evening.      levothyroxine (SYNTHROID) 88 MCG tablet Take 1 tablet (88 mcg total) by mouth daily before breakfast. 30 tablet 1   lidocaine-prilocaine (EMLA) cream APPLY 1 APPLICATION TOPICALLY AS NEEDED. (Patient taking differently: Apply 1 application topically as needed (for pain). ) 30 g 0   lisdexamfetamine (VYVANSE) 30 MG capsule Take 30 mg by mouth every morning.      morphine (MS CONTIN) 30 MG 12 hr tablet Take 30 mg by mouth every 12 (twelve) hours.      ondansetron (ZOFRAN) 8 MG tablet Take 1 tablet (8 mg total)  by mouth 2 (two) times daily as needed (Nausea or vomiting). 30 tablet 1   Oxycodone HCl 10 MG TABS Take 1 tablet (10 mg total) by mouth every 6 (six) hours as needed. (Patient taking differently: Take 10 mg by mouth every 6 (six) hours as needed (for breakthrough pain). ) 90 tablet 0   polyethylene glycol (MIRALAX / GLYCOLAX) 17 g packet Take 17 g by mouth daily as needed for mild constipation or moderate constipation. 14 each 0   predniSONE (DELTASONE) 10 MG tablet Take 3 tablets (30 mg total) by mouth daily with breakfast for 5 days, THEN 2 tablets (20 mg total) daily with breakfast for 7 days, THEN 1 tablet (10 mg total) daily with breakfast for 7 days, THEN  0.5 tablets (5 mg total) daily with breakfast for 7 days. 39.5 tablet 0   prochlorperazine (COMPAZINE) 10 MG tablet Take 1 tablet (10 mg total) by mouth every 6 (six) hours as needed (Nausea or vomiting). 30 tablet 1   rizatriptan (MAXALT) 10 MG tablet Take 10 mg by mouth daily as needed for migraine (and may repeat once in 2 hours, if no relief).      senna-docusate (SENOKOT-S) 8.6-50 MG tablet Take 1 tablet by mouth 2 (two) times daily. 60 tablet 0   spironolactone (ALDACTONE) 100 MG tablet Take 100 mg by mouth daily.      traZODone (DESYREL) 50 MG tablet Take 1 tablet (50 mg total) by mouth at bedtime as needed for sleep. 90 tablet 1   No current facility-administered medications for this visit.    Facility-Administered Medications Ordered in Other Visits  Medication Dose Route Frequency Provider Last Rate Last Dose   sodium chloride flush (NS) 0.9 % injection 10 mL  10 mL Intravenous PRN Nicholas Lose, MD   10 mL at 12/21/18 1152    PHYSICAL EXAMINATION: ECOG PERFORMANCE STATUS: 1 - Symptomatic but completely ambulatory  There were no vitals filed for this visit. There were no vitals filed for this visit.  GENERAL: alert, no distress and comfortable SKIN: skin color, texture, turgor are normal, no rashes or significant lesions EYES: normal, Conjunctiva are pink and non-injected, sclera clear OROPHARYNX: no exudate, no erythema and lips, buccal mucosa, and tongue normal  NECK: supple, thyroid normal size, non-tender, without nodularity LYMPH: no palpable lymphadenopathy in the cervical, axillary or inguinal LUNGS: clear to auscultation and percussion with normal breathing effort HEART: regular rate & rhythm and no murmurs and no lower extremity edema ABDOMEN: abdomen soft, non-tender and normal bowel sounds MUSCULOSKELETAL: no cyanosis of digits and no clubbing  NEURO: alert & oriented x 3 with fluent speech, no focal motor/sensory deficits EXTREMITIES: No lower extremity  edema  LABORATORY DATA:  I have reviewed the data as listed CMP Latest Ref Rng & Units 12/14/2018 12/13/2018 12/12/2018  Glucose 70 - 99 mg/dL 99 90 96  BUN 6 - 20 mg/dL 19 17 21(H)  Creatinine 0.44 - 1.00 mg/dL 0.65 0.74 0.69  Sodium 135 - 145 mmol/L 137 136 136  Potassium 3.5 - 5.1 mmol/L 3.9 3.3(L) 3.1(L)  Chloride 98 - 111 mmol/L 100 98 100  CO2 22 - 32 mmol/L _0 Calcium 8.9 - 10.3 mg/dL 8.2(L) 8.5(L) 8.7(L)  Total Protein 6.5 - 8.1 g/dL - - -  Total Bilirubin 0.3 - 1.2 mg/dL - - -  Alkaline Phos 38 - 126 U/L - - -  AST 15 - 41 U/L - - -  ALT 0 - 44 U/L - - -  Lab Results  Component Value Date   WBC 2.5 (L) 12/21/2018   HGB 10.8 (L) 12/21/2018   HCT 32.4 (L) 12/21/2018   MCV 105.2 (H) 12/21/2018   PLT 356 12/21/2018   NEUTROABS 1.7 12/21/2018    ASSESSMENT & PLAN:  Malignant neoplasm of lower-outer quadrant of left breast of female, estrogen receptor negative (HCC) Mammogram and ultrasound of the left breast revealed 1.7 cm mass at 4:00 position, 6:30 position 5 x 4 x 4 mm mass, 6:00 position 5 cm nipple 7 x 6 x 11 mm, left axillary lymph node with thickened cortex, T1c N1 stage II a AJCC 8  10/24/2016: Left breast biopsy 6:30 position 3 cm from nipple: IDC grade 2, DCIS, ER 0%, PR 0%, Ki-67 15% HER-2 positive ratio 2.1; 4:00 position 3 cm from nipple: IDC grade 2, DCIS, ER 0%, PR 0%, Ki-67 35%, HER-2 positive ratio 2.02 Lymph node biopsy positive  Treatment Summary: 1. Neoadjuvant chemotherapy with TCHPcompleted 02/17/2017 this would be followed by Herceptinand Perjetamaintenance for 1 yearcompleted September 2019 2.Bilateral mastectomies 03/28/2016:Bilateral mastectomies: Left mastectomy: IDC grade 2 0.9 cm, nodes negative, right mastectomy benign, ER 0%, PR 0%, HER-2 positive ratio 2.6 3.Adjuvant radiation4/08/2017 to 06/09/2017 4.  Neratinib started 10/12/2017 discontinued due to diarrhea 5.  Elbow fracture: Due to metastatic disease, palliative  radiation therapy 6.  Carboplatin atezolizumab at [facility] on a clinical trial [facility] 043 stopped for progression 12/04/2018 ---------------------------------------------------------------------------------------------------------------- Lung nodule biopsy: Metastatic breast cancer triple negative  CT CAP 12/03/2018: Progression of lung nodules and bone metastases done at [facility]  Current treatment: Halaven days 1 and 8 every 3 weeks with Herceptin and Perjeta every 3 weeks, today cycle 1 day 8 [place] Chemo toxicities:  Bone metastases: Xgeva Return to clinic in 2 weeks for cycle 2    No orders of the defined types were placed in this encounter.  The patient has a good understanding of the overall plan. she agrees with it. she will call with any problems that may develop before the next visit here.  Nicholas Lose, MD 12/21/2018  Julious Oka Dorshimer, am acting as scribe for Dr. Nicholas Lose.  I have reviewed the above documentation for accuracy and completeness, and I agree with the above.

## 2018-12-21 ENCOUNTER — Inpatient Hospital Stay: Payer: 59

## 2018-12-21 ENCOUNTER — Ambulatory Visit: Payer: 59

## 2018-12-21 ENCOUNTER — Other Ambulatory Visit: Payer: Self-pay

## 2018-12-21 ENCOUNTER — Ambulatory Visit
Admission: RE | Admit: 2018-12-21 | Discharge: 2018-12-21 | Disposition: A | Discharge status: Home / Self Care | Payer: 59 | Source: Ambulatory Visit | Attending: Radiation Oncology | Admitting: Radiation Oncology

## 2018-12-21 ENCOUNTER — Inpatient Hospital Stay (HOSPITAL_BASED_OUTPATIENT_CLINIC_OR_DEPARTMENT_OTHER): Payer: 59 | Admitting: Hematology and Oncology

## 2018-12-21 VITALS — BP 134/87 | HR 98 | Temp 98.3°F | Resp 18 | Ht 67.0 in | Wt 177.0 lb

## 2018-12-21 DIAGNOSIS — Z171 Estrogen receptor negative status [ER-]: Secondary | ICD-10-CM | POA: Diagnosis not present

## 2018-12-21 DIAGNOSIS — Z9013 Acquired absence of bilateral breasts and nipples: Secondary | ICD-10-CM | POA: Diagnosis not present

## 2018-12-21 DIAGNOSIS — Z5111 Encounter for antineoplastic chemotherapy: Secondary | ICD-10-CM | POA: Diagnosis not present

## 2018-12-21 DIAGNOSIS — C7951 Secondary malignant neoplasm of bone: Secondary | ICD-10-CM | POA: Diagnosis not present

## 2018-12-21 DIAGNOSIS — C50512 Malignant neoplasm of lower-outer quadrant of left female breast: Secondary | ICD-10-CM

## 2018-12-21 DIAGNOSIS — Z9221 Personal history of antineoplastic chemotherapy: Secondary | ICD-10-CM | POA: Diagnosis not present

## 2018-12-21 DIAGNOSIS — E032 Hypothyroidism due to medicaments and other exogenous substances: Secondary | ICD-10-CM | POA: Diagnosis not present

## 2018-12-21 DIAGNOSIS — Z79899 Other long term (current) drug therapy: Secondary | ICD-10-CM | POA: Diagnosis not present

## 2018-12-21 DIAGNOSIS — Z923 Personal history of irradiation: Secondary | ICD-10-CM | POA: Diagnosis not present

## 2018-12-21 DIAGNOSIS — Z7189 Other specified counseling: Secondary | ICD-10-CM

## 2018-12-21 DIAGNOSIS — Z95828 Presence of other vascular implants and grafts: Secondary | ICD-10-CM

## 2018-12-21 LAB — CBC WITH DIFFERENTIAL (CANCER CENTER ONLY)
Abs Immature Granulocytes: 0.19 10*3/uL — ABNORMAL HIGH (ref 0.00–0.07)
Basophils Absolute: 0 10*3/uL (ref 0.0–0.1)
Basophils Relative: 0 %
Eosinophils Absolute: 0 10*3/uL (ref 0.0–0.5)
Eosinophils Relative: 0 %
HCT: 32.4 % — ABNORMAL LOW (ref 36.0–46.0)
Hemoglobin: 10.8 g/dL — ABNORMAL LOW (ref 12.0–15.0)
Immature Granulocytes: 8 %
Lymphocytes Relative: 19 %
Lymphs Abs: 0.5 10*3/uL — ABNORMAL LOW (ref 0.7–4.0)
MCH: 35.1 pg — ABNORMAL HIGH (ref 26.0–34.0)
MCHC: 33.3 g/dL (ref 30.0–36.0)
MCV: 105.2 fL — ABNORMAL HIGH (ref 80.0–100.0)
Monocytes Absolute: 0.2 10*3/uL (ref 0.1–1.0)
Monocytes Relative: 7 %
Neutro Abs: 1.7 10*3/uL (ref 1.7–7.7)
Neutrophils Relative %: 66 %
Platelet Count: 356 10*3/uL (ref 150–400)
RBC: 3.08 MIL/uL — ABNORMAL LOW (ref 3.87–5.11)
RDW: 16.6 % — ABNORMAL HIGH (ref 11.5–15.5)
WBC Count: 2.5 10*3/uL — ABNORMAL LOW (ref 4.0–10.5)
nRBC: 1.6 % — ABNORMAL HIGH (ref 0.0–0.2)

## 2018-12-21 LAB — CMP (CANCER CENTER ONLY)
ALT: 50 U/L — ABNORMAL HIGH (ref 0–44)
AST: 29 U/L (ref 15–41)
Albumin: 3.4 g/dL — ABNORMAL LOW (ref 3.5–5.0)
Alkaline Phosphatase: 105 U/L (ref 38–126)
Anion gap: 11 (ref 5–15)
BUN: 18 mg/dL (ref 6–20)
CO2: 29 mmol/L (ref 22–32)
Calcium: 8.7 mg/dL — ABNORMAL LOW (ref 8.9–10.3)
Chloride: 99 mmol/L (ref 98–111)
Creatinine: 0.82 mg/dL (ref 0.44–1.00)
GFR, Est AFR Am: >60 mL/min (ref >60)
GFR, Estimated: >60 mL/min (ref >60)
Glucose, Bld: 110 mg/dL — ABNORMAL HIGH (ref 70–99)
Potassium: 3.3 mmol/L — ABNORMAL LOW (ref 3.5–5.1)
Sodium: 139 mmol/L (ref 135–145)
Total Bilirubin: 0.3 mg/dL (ref 0.3–1.2)
Total Protein: 7 g/dL (ref 6.5–8.1)

## 2018-12-21 LAB — TSH: TSH: 2.178 u[IU]/mL (ref 0.308–3.960)

## 2018-12-21 MED ORDER — SODIUM CHLORIDE 0.9% FLUSH
10.0000 mL | INTRAVENOUS | Status: DC | PRN
  Administered 2018-12-21: 10 mL
  Filled 2018-12-21: qty 10

## 2018-12-21 MED ORDER — PROCHLORPERAZINE MALEATE 10 MG PO TABS
ORAL_TABLET | ORAL | Status: AC
  Filled 2018-12-21: qty 1

## 2018-12-21 MED ORDER — SODIUM CHLORIDE 0.9 % IV SOLN
1.4200 mg/m2 | Freq: Once | INTRAVENOUS | Status: AC
  Administered 2018-12-21: 2.7 mg via INTRAVENOUS
  Filled 2018-12-21: qty 5.4

## 2018-12-21 MED ORDER — PROCHLORPERAZINE MALEATE 10 MG PO TABS
10.0000 mg | ORAL_TABLET | Freq: Once | ORAL | Status: AC
  Administered 2018-12-21: 10 mg via ORAL

## 2018-12-21 MED ORDER — SODIUM CHLORIDE 0.9% FLUSH
10.0000 mL | INTRAVENOUS | Status: DC | PRN
  Administered 2018-12-21: 10 mL via INTRAVENOUS
  Filled 2018-12-21: qty 10

## 2018-12-21 MED ORDER — HEPARIN SOD (PORK) LOCK FLUSH 100 UNIT/ML IV SOLN
500.0000 [IU] | Freq: Once | INTRAVENOUS | Status: AC | PRN
  Administered 2018-12-21: 500 [IU]
  Filled 2018-12-21: qty 5

## 2018-12-21 MED ORDER — SODIUM CHLORIDE 0.9 % IV SOLN
Freq: Once | INTRAVENOUS | Status: AC
  Administered 2018-12-21: 13:00:00 via INTRAVENOUS
  Filled 2018-12-21: qty 250

## 2018-12-21 NOTE — Assessment & Plan Note (Signed)
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 Neoadjuvant chemotherapy with TCHPcompleted 02/17/2017 this would be followed by Herceptinand Perjetamaintenance for 1 yearcompleted September 2019 2.Bilateral mastectomies 03/28/2016:Bilateral mastectomies: Left mastectomy: IDC grade 2 0.9 cm, nodes negative, right mastectomy benign, ER 0%, PR 0%, HER-2 positive ratio 2.6 3.Adjuvant radiation4/08/2017 to 06/09/2017 4.  Neratinib started 10/12/2017 discontinued due to diarrhea 5.  Elbow fracture: Due to metastatic disease, palliative radiation therapy 6.  Carboplatin atezolizumab at [facility] on a clinical trial [facility] [id number] stopped for progression 12/04/2018 ---------------------------------------------------------------------------------------------------------------- Lung nodule biopsy: Metastatic breast cancer triple negative  CT CAP 12/03/2018: Progression of lung nodules and bone metastases done at [facility]  Current treatment: Halaven days 1 and 8 every 3 weeks with Herceptin and Perjeta every 3 weeks, today cycle 1 day 8 [place] Chemo toxicities:  Bone metastases: Xgeva Return to clinic in 2 weeks for cycle 2   

## 2018-12-21 NOTE — Patient Instructions (Addendum)
[facility] Discharge Instructions for Patients Receiving Chemotherapy  Today you received the following chemotherapy agents: Halaven.  To help prevent nausea and vomiting after your treatment, we encourage you to take your nausea medication as directed.   If you develop nausea and vomiting that is not controlled by your nausea medication, call the clinic.   BELOW ARE SYMPTOMS THAT SHOULD BE REPORTED IMMEDIATELY:  *FEVER GREATER THAN 100.5 F  *CHILLS WITH OR WITHOUT FEVER  NAUSEA AND VOMITING THAT IS NOT CONTROLLED WITH YOUR NAUSEA MEDICATION  *UNUSUAL SHORTNESS OF BREATH  *UNUSUAL BRUISING OR BLEEDING  TENDERNESS IN MOUTH AND THROAT WITH OR WITHOUT PRESENCE OF ULCERS  *URINARY PROBLEMS  *BOWEL PROBLEMS  UNUSUAL RASH Items with * indicate a potential emergency and should be followed up as soon as possible.  Feel free to call the clinic should you have any questions or concerns. The clinic phone number is [phone].  Please show the [facility] at check-in to the Emergency Department and triage nurse.  Eribulin solution for injection What is this medicine? ERIBULIN (er e bu lin) is a chemotherapy drug. It is used to treat breast cancer and liposarcoma. This medicine may be used for other purposes; ask your health care provider or pharmacist if you have questions. COMMON BRAND NAME(S): Halaven What should I tell my health care provider before I take this medicine? They need to know if you have any of these conditions:  heart disease  history of irregular heartbeat  kidney disease  liver disease  low blood counts, like low white cell, platelet, or red cell counts  low levels of potassium or magnesium in the blood  an unusual or allergic reaction to eribulin, other medicines, foods, dyes, or preservatives  pregnant or trying to get pregnant  breast-feeding How should I use this medicine? This medicine is for infusion into a vein. It is  given by a health care professional in a hospital or clinic setting. Talk to your pediatrician regarding the use of this medicine in children. Special care may be needed. Overdosage: If you think you have taken too much of this medicine contact a poison control center or emergency room at once. NOTE: This medicine is only for you. Do not share this medicine with others. What if I miss a dose? It is important not to miss your dose. Call your doctor or health care professional if you are unable to keep an appointment. What may interact with this medicine? Do not take this medicine with any of the following medications:  amiodarone  astemizole  arsenic trioxide  bepridil  bretylium  chloroquine  chlorpromazine  cisapride  clarithromycin  dextromethorphan, quinidine  disopyramide  droperidol  dronedarone  erythromycin  grepafloxacin  halofantrine  haloperidol  ibutilide  levomethadyl  mesoridazine  methadone  pentamidine  procainamide  quinidine  pimozide  posaconazole  probucol  propafenone  saquinavir  sotalol  sparfloxacin  terfenadine  thioridazine  troleandomycin This medicine may also interact with the following medications:  dofetilide  ziprasidone This list may not describe all possible interactions. Give your health care provider a list of all the medicines, herbs, non-prescription drugs, or dietary supplements you use. Also tell them if you smoke, drink alcohol, or use illegal drugs. Some items may interact with your medicine. What should I watch for while using this medicine? This drug may make you feel generally unwell. This is not uncommon, as chemotherapy can affect healthy cells as well as cancer cells. Report  any side effects. Continue your course of treatment even though you feel ill unless your doctor tells you to stop. Call your doctor or health care professional for advice if you get a fever, chills or sore throat, or  other symptoms of a cold or flu. Do not treat yourself. This drug decreases your body's ability to fight infections. Try to avoid being around people who are sick. This medicine may increase your risk to bruise or bleed. Call your doctor or health care professional if you notice any unusual bleeding. You may need blood work done while you are taking this medicine. Do not become pregnant while taking this medicine or for 2 weeks after stopping it. Women should inform their doctor if they wish to become pregnant or think they might be pregnant. Men should not father a child while taking this medicine and for 3.5 months after stopping it. There is a potential for serious side effects to an unborn child. Talk to your health care professional or pharmacist for more information. Do not breast-feed an infant while taking this medicine or for 2 weeks after stopping it. What side effects may I notice from receiving this medicine? Side effects that you should report to your doctor or health care professional as soon as possible:  allergic reactions like skin rash, itching or hives, swelling of the face, lips, or tongue  low blood counts - this medicine may decrease the number of white blood cells, red blood cells and platelets. You may be at increased risk for infections and bleeding.  signs of infection - fever or chills, cough, sore throat, pain or difficulty passing urine  signs of decreased platelets or bleeding - bruising, pinpoint red spots on the skin, black, tarry stools, blood in the urine  signs of decreased red blood cells - unusually weak or tired, fainting spells, lightheadedness  pain, tingling, numbness in the hands or feet Side effects that usually do not require medical attention (report to your doctor or health care professional if they continue or are bothersome):  constipation  hair loss  headache  loss of appetite  muscle or joint pain  nausea, vomiting  stomach pain This  list may not describe all possible side effects. Call your doctor for medical advice about side effects. You may report side effects to FDA at 1-800-FDA-1088. Where should I keep my medicine? This drug is given in a hospital or clinic and will not be stored at home. NOTE: This sheet is a summary. It may not cover all possible information. If you have questions about this medicine, talk to your doctor, pharmacist, or health care provider.  2020 Elsevier/Gold Standard (2018-01-08 11:00:16)  Denosumab injection What is this medicine? DENOSUMAB (den oh sue mab) slows bone breakdown. Prolia is used to treat osteoporosis in women after menopause and in men, and in people who are taking corticosteroids for 6 months or more. Delton See is used to treat a high calcium level due to cancer and to prevent bone fractures and other bone problems caused by multiple myeloma or cancer bone metastases. Delton See is also used to treat giant cell tumor of the bone. This medicine may be used for other purposes; ask your health care provider or pharmacist if you have questions. COMMON BRAND NAME(S): Prolia, XGEVA What should I tell my health care provider before I take this medicine? They need to know if you have any of these conditions:  dental disease  having surgery or tooth extraction  infection  kidney disease  low levels of calcium or Vitamin D in the blood  malnutrition  on hemodialysis  skin conditions or sensitivity  thyroid or parathyroid disease  an unusual reaction to denosumab, other medicines, foods, dyes, or preservatives  pregnant or trying to get pregnant  breast-feeding How should I use this medicine? This medicine is for injection under the skin. It is given by a health care professional in a hospital or clinic setting. A special MedGuide will be given to you before each treatment. Be sure to read this information carefully each time. For Prolia, talk to your pediatrician regarding the  use of this medicine in children. Special care may be needed. For Delton See, talk to your pediatrician regarding the use of this medicine in children. While this drug may be prescribed for children as young as 13 years for selected conditions, precautions do apply. Overdosage: If you think you have taken too much of this medicine contact a poison control center or emergency room at once. NOTE: This medicine is only for you. Do not share this medicine with others. What if I miss a dose? It is important not to miss your dose. Call your doctor or health care professional if you are unable to keep an appointment. What may interact with this medicine? Do not take this medicine with any of the following medications:  other medicines containing denosumab This medicine may also interact with the following medications:  medicines that lower your chance of fighting infection  steroid medicines like prednisone or cortisone This list may not describe all possible interactions. Give your health care provider a list of all the medicines, herbs, non-prescription drugs, or dietary supplements you use. Also tell them if you smoke, drink alcohol, or use illegal drugs. Some items may interact with your medicine. What should I watch for while using this medicine? Visit your doctor or health care professional for regular checks on your progress. Your doctor or health care professional may order blood tests and other tests to see how you are doing. Call your doctor or health care professional for advice if you get a fever, chills or sore throat, or other symptoms of a cold or flu. Do not treat yourself. This drug may decrease your body's ability to fight infection. Try to avoid being around people who are sick. You should make sure you get enough calcium and vitamin D while you are taking this medicine, unless your doctor tells you not to. Discuss the foods you eat and the vitamins you take with your health care  professional. See your dentist regularly. Brush and floss your teeth as directed. Before you have any dental work done, tell your dentist you are receiving this medicine. Do not become pregnant while taking this medicine or for 5 months after stopping it. Talk with your doctor or health care professional about your birth control options while taking this medicine. Women should inform their doctor if they wish to become pregnant or think they might be pregnant. There is a potential for serious side effects to an unborn child. Talk to your health care professional or pharmacist for more information. What side effects may I notice from receiving this medicine? Side effects that you should report to your doctor or health care professional as soon as possible:  allergic reactions like skin rash, itching or hives, swelling of the face, lips, or tongue  bone pain  breathing problems  dizziness  jaw pain, especially after dental work  redness, blistering, peeling of the skin  signs  and symptoms of infection like fever or chills; cough; sore throat; pain or trouble passing urine  signs of low calcium like fast heartbeat, muscle cramps or muscle pain; pain, tingling, numbness in the hands or feet; seizures  unusual bleeding or bruising  unusually weak or tired Side effects that usually do not require medical attention (report to your doctor or health care professional if they continue or are bothersome):  constipation  diarrhea  headache  joint pain  loss of appetite  muscle pain  runny nose  tiredness  upset stomach This list may not describe all possible side effects. Call your doctor for medical advice about side effects. You may report side effects to FDA at 1-800-FDA-1088. Where should I keep my medicine? This medicine is only given in a clinic, doctor's office, or other health care setting and will not be stored at home. NOTE: This sheet is a summary. It may not cover all  possible information. If you have questions about this medicine, talk to your doctor, pharmacist, or health care provider.  2020 Elsevier/Gold Standard (2017-05-26 16:10:44)

## 2018-12-21 NOTE — Patient Instructions (Signed)

## 2018-12-23 ENCOUNTER — Ambulatory Visit
Admission: RE | Admit: 2018-12-23 | Discharge: 2018-12-23 | Disposition: A | Discharge status: Home / Self Care | Payer: 59 | Source: Ambulatory Visit | Attending: Radiation Oncology | Admitting: Radiation Oncology

## 2018-12-23 ENCOUNTER — Ambulatory Visit: Payer: 59

## 2018-12-23 ENCOUNTER — Other Ambulatory Visit: Payer: Self-pay

## 2018-12-23 DIAGNOSIS — C7951 Secondary malignant neoplasm of bone: Secondary | ICD-10-CM | POA: Diagnosis not present

## 2018-12-24 ENCOUNTER — Ambulatory Visit
Admission: RE | Admit: 2018-12-24 | Discharge: 2018-12-24 | Disposition: A | Discharge status: Home / Self Care | Payer: 59 | Source: Ambulatory Visit | Attending: Radiation Oncology | Admitting: Radiation Oncology

## 2018-12-24 ENCOUNTER — Other Ambulatory Visit: Payer: Self-pay

## 2018-12-24 ENCOUNTER — Ambulatory Visit: Payer: 59

## 2018-12-24 DIAGNOSIS — C7951 Secondary malignant neoplasm of bone: Secondary | ICD-10-CM | POA: Diagnosis not present

## 2018-12-25 ENCOUNTER — Other Ambulatory Visit: Payer: Self-pay | Admitting: Radiation Oncology

## 2018-12-25 ENCOUNTER — Ambulatory Visit: Payer: 59

## 2018-12-25 ENCOUNTER — Other Ambulatory Visit: Payer: Self-pay

## 2018-12-25 ENCOUNTER — Ambulatory Visit
Admission: RE | Admit: 2018-12-25 | Discharge: 2018-12-25 | Disposition: A | Discharge status: Home / Self Care | Payer: 59 | Source: Ambulatory Visit | Attending: Radiation Oncology | Admitting: Radiation Oncology

## 2018-12-25 DIAGNOSIS — C7951 Secondary malignant neoplasm of bone: Secondary | ICD-10-CM | POA: Diagnosis not present

## 2018-12-25 MED ORDER — LIDOCAINE VISCOUS HCL 2 % MT SOLN: 10.0000 mL | Freq: Four times a day (QID) | OROMUCOSAL | 1 refills | Status: DC | PRN

## 2018-12-26 ENCOUNTER — Ambulatory Visit: Payer: 59

## 2018-12-26 ENCOUNTER — Telehealth: Payer: Self-pay | Admitting: *Deleted

## 2018-12-26 DIAGNOSIS — I82409 Acute embolism and thrombosis of unspecified deep veins of unspecified lower extremity: Secondary | ICD-10-CM | POA: Insufficient documentation

## 2018-12-26 DIAGNOSIS — D709 Neutropenia, unspecified: Secondary | ICD-10-CM | POA: Insufficient documentation

## 2018-12-26 DIAGNOSIS — J029 Acute pharyngitis, unspecified: Secondary | ICD-10-CM | POA: Insufficient documentation

## 2018-12-26 DIAGNOSIS — R5081 Fever presenting with conditions classified elsewhere: Secondary | ICD-10-CM | POA: Insufficient documentation

## 2018-12-26 NOTE — Telephone Encounter (Signed)
Received message from pt stating she is at [facility] and is experiencing a fever of 101.8.  Pt states she went to the hospital there and was admitted.  Covid test, strep test, and influenza test all negative.  Pending blood cultures.  Pt states she will keep our office updated on any results and care she receives.

## 2018-12-28 ENCOUNTER — Other Ambulatory Visit: Payer: Self-pay | Admitting: Hematology and Oncology

## 2018-12-30 ENCOUNTER — Ambulatory Visit: Payer: 59

## 2018-12-31 ENCOUNTER — Other Ambulatory Visit: Payer: Self-pay

## 2018-12-31 ENCOUNTER — Ambulatory Visit
Admission: RE | Admit: 2018-12-31 | Discharge: 2018-12-31 | Disposition: A | Discharge status: Home / Self Care | Payer: 59 | Source: Ambulatory Visit | Attending: Radiation Oncology | Admitting: Radiation Oncology

## 2018-12-31 ENCOUNTER — Ambulatory Visit: Payer: 59

## 2019-01-01 ENCOUNTER — Encounter (HOSPITAL_COMMUNITY): Payer: Self-pay | Admitting: Radiology

## 2019-01-01 ENCOUNTER — Emergency Department (HOSPITAL_COMMUNITY): Payer: 59

## 2019-01-01 ENCOUNTER — Telehealth: Payer: Self-pay | Admitting: Radiation Oncology

## 2019-01-01 ENCOUNTER — Other Ambulatory Visit: Payer: Self-pay | Admitting: Radiation Oncology

## 2019-01-01 ENCOUNTER — Other Ambulatory Visit: Payer: Self-pay

## 2019-01-01 ENCOUNTER — Inpatient Hospital Stay: Payer: 59

## 2019-01-01 ENCOUNTER — Other Ambulatory Visit: Payer: Self-pay | Admitting: Medical

## 2019-01-01 ENCOUNTER — Inpatient Hospital Stay: Payer: 59 | Attending: Hematology and Oncology | Admitting: Medical

## 2019-01-01 ENCOUNTER — Telehealth: Payer: Self-pay | Admitting: *Deleted

## 2019-01-01 ENCOUNTER — Ambulatory Visit: Payer: 59

## 2019-01-01 ENCOUNTER — Emergency Department (HOSPITAL_COMMUNITY)
Admission: EM | Admit: 2019-01-01 | Discharge: 2019-01-01 | Disposition: A | Discharge status: Home / Self Care | Payer: 59 | Source: Home / Self Care | Attending: Emergency Medicine | Admitting: Emergency Medicine

## 2019-01-01 VITALS — BP 116/81 | HR 86 | Temp 98.2°F | Resp 22

## 2019-01-01 DIAGNOSIS — Z79899 Other long term (current) drug therapy: Secondary | ICD-10-CM | POA: Insufficient documentation

## 2019-01-01 DIAGNOSIS — M545 Low back pain, unspecified: Secondary | ICD-10-CM

## 2019-01-01 DIAGNOSIS — C7951 Secondary malignant neoplasm of bone: Secondary | ICD-10-CM

## 2019-01-01 DIAGNOSIS — Z5111 Encounter for antineoplastic chemotherapy: Secondary | ICD-10-CM | POA: Insufficient documentation

## 2019-01-01 DIAGNOSIS — C50512 Malignant neoplasm of lower-outer quadrant of left female breast: Secondary | ICD-10-CM

## 2019-01-01 DIAGNOSIS — Z171 Estrogen receptor negative status [ER-]: Secondary | ICD-10-CM | POA: Insufficient documentation

## 2019-01-01 DIAGNOSIS — D696 Thrombocytopenia, unspecified: Secondary | ICD-10-CM | POA: Insufficient documentation

## 2019-01-01 DIAGNOSIS — Z853 Personal history of malignant neoplasm of breast: Secondary | ICD-10-CM | POA: Insufficient documentation

## 2019-01-01 DIAGNOSIS — Z923 Personal history of irradiation: Secondary | ICD-10-CM | POA: Insufficient documentation

## 2019-01-01 DIAGNOSIS — Z7901 Long term (current) use of anticoagulants: Secondary | ICD-10-CM | POA: Insufficient documentation

## 2019-01-01 DIAGNOSIS — C50919 Malignant neoplasm of unspecified site of unspecified female breast: Secondary | ICD-10-CM | POA: Diagnosis not present

## 2019-01-01 DIAGNOSIS — Z9013 Acquired absence of bilateral breasts and nipples: Secondary | ICD-10-CM | POA: Insufficient documentation

## 2019-01-01 DIAGNOSIS — R52 Pain, unspecified: Secondary | ICD-10-CM

## 2019-01-01 DIAGNOSIS — Z9221 Personal history of antineoplastic chemotherapy: Secondary | ICD-10-CM | POA: Insufficient documentation

## 2019-01-01 DIAGNOSIS — E039 Hypothyroidism, unspecified: Secondary | ICD-10-CM | POA: Insufficient documentation

## 2019-01-01 LAB — CMP (CANCER CENTER ONLY)
ALT: 59 U/L — ABNORMAL HIGH (ref 0–44)
AST: 51 U/L — ABNORMAL HIGH (ref 15–41)
Albumin: 3.7 g/dL (ref 3.5–5.0)
Alkaline Phosphatase: 104 U/L (ref 38–126)
Anion gap: 11 (ref 5–15)
BUN: 13 mg/dL (ref 6–20)
CO2: 30 mmol/L (ref 22–32)
Calcium: 9.3 mg/dL (ref 8.9–10.3)
Chloride: 100 mmol/L (ref 98–111)
Creatinine: 0.85 mg/dL (ref 0.44–1.00)
GFR, Est AFR Am: >60 mL/min (ref >60)
GFR, Estimated: >60 mL/min (ref >60)
Glucose, Bld: 78 mg/dL (ref 70–99)
Potassium: 3.6 mmol/L (ref 3.5–5.1)
Sodium: 141 mmol/L (ref 135–145)
Total Bilirubin: 0.2 mg/dL — ABNORMAL LOW (ref 0.3–1.2)
Total Protein: 7 g/dL (ref 6.5–8.1)

## 2019-01-01 LAB — CBC WITH DIFFERENTIAL (CANCER CENTER ONLY)
Abs Immature Granulocytes: 1.39 10*3/uL — ABNORMAL HIGH (ref 0.00–0.07)
Basophils Absolute: 0 10*3/uL (ref 0.0–0.1)
Basophils Relative: 1 %
Eosinophils Absolute: 0 10*3/uL (ref 0.0–0.5)
Eosinophils Relative: 0 %
HCT: 31.9 % — ABNORMAL LOW (ref 36.0–46.0)
Hemoglobin: 10.3 g/dL — ABNORMAL LOW (ref 12.0–15.0)
Immature Granulocytes: 19 %
Lymphocytes Relative: 16 %
Lymphs Abs: 1.2 10*3/uL (ref 0.7–4.0)
MCH: 34.3 pg — ABNORMAL HIGH (ref 26.0–34.0)
MCHC: 32.3 g/dL (ref 30.0–36.0)
MCV: 106.3 fL — ABNORMAL HIGH (ref 80.0–100.0)
Monocytes Absolute: 1.5 10*3/uL — ABNORMAL HIGH (ref 0.1–1.0)
Monocytes Relative: 20 %
Neutro Abs: 3.3 10*3/uL (ref 1.7–7.7)
Neutrophils Relative %: 45 %
Platelet Count: 156 10*3/uL (ref 150–400)
RBC: 3 MIL/uL — ABNORMAL LOW (ref 3.87–5.11)
RDW: 17 % — ABNORMAL HIGH (ref 11.5–15.5)
WBC Count: 7.4 10*3/uL (ref 4.0–10.5)
nRBC: 4 /100 WBC — ABNORMAL HIGH
nRBC: 4.4 % — ABNORMAL HIGH (ref 0.0–0.2)

## 2019-01-01 LAB — URINALYSIS, ROUTINE W REFLEX MICROSCOPIC
Bilirubin Urine: NEGATIVE
Glucose, UA: NEGATIVE mg/dL
Hgb urine dipstick: NEGATIVE
Ketones, ur: 5 mg/dL — AB
Leukocytes,Ua: NEGATIVE
Nitrite: NEGATIVE
Protein, ur: 30 mg/dL — AB
Specific Gravity, Urine: 1.028 (ref 1.005–1.030)
pH: 5 (ref 5.0–8.0)

## 2019-01-01 LAB — PREGNANCY, URINE: Preg Test, Ur: NEGATIVE

## 2019-01-01 MED ORDER — MORPHINE SULFATE 4 MG/ML IJ SOLN
2.0000 mg | Freq: Once | INTRAMUSCULAR | Status: AC
  Administered 2019-01-01: 2 mg via INTRAVENOUS
  Filled 2019-01-01: qty 1

## 2019-01-01 MED ORDER — FENTANYL CITRATE (PF) 100 MCG/2ML IJ SOLN
50.0000 ug | INTRAMUSCULAR | Status: AC | PRN
  Administered 2019-01-01 (×2): 50 ug via INTRAVENOUS
  Filled 2019-01-01 (×2): qty 2

## 2019-01-01 MED ORDER — MORPHINE SULFATE (PF) 4 MG/ML IV SOLN
INTRAVENOUS | Status: AC
  Filled 2019-01-01: qty 1

## 2019-01-01 MED ORDER — SODIUM CHLORIDE (PF) 0.9 % IJ SOLN
INTRAMUSCULAR | Status: AC
  Filled 2019-01-01: qty 50

## 2019-01-01 MED ORDER — IOHEXOL 300 MG/ML  SOLN
100.0000 mL | Freq: Once | INTRAMUSCULAR | Status: AC | PRN
  Administered 2019-01-01: 100 mL via INTRAVENOUS

## 2019-01-01 MED ORDER — SODIUM CHLORIDE 0.9 % IV SOLN
Freq: Once | INTRAVENOUS | Status: AC
  Administered 2019-01-01: 11:00:00 via INTRAVENOUS
  Filled 2019-01-01: qty 250

## 2019-01-01 MED ORDER — HEPARIN SOD (PORK) LOCK FLUSH 100 UNIT/ML IV SOLN
INTRAVENOUS | Status: AC
  Filled 2019-01-01: qty 5

## 2019-01-01 MED ORDER — OXYCODONE-ACETAMINOPHEN 5-325 MG PO TABS
1.0000 | ORAL_TABLET | Freq: Once | ORAL | Status: AC
  Administered 2019-01-01: 1 via ORAL
  Filled 2019-01-01: qty 1

## 2019-01-01 MED ORDER — HYDROMORPHONE HCL 1 MG/ML IJ SOLN
1.0000 mg | Freq: Once | INTRAMUSCULAR | Status: AC
  Administered 2019-01-01: 1 mg via INTRAVENOUS
  Filled 2019-01-01: qty 1

## 2019-01-01 MED ORDER — HEPARIN SOD (PORK) LOCK FLUSH 100 UNIT/ML IV SOLN
500.0000 [IU] | Freq: Once | INTRAVENOUS | Status: AC
  Administered 2019-01-01: 500 [IU]

## 2019-01-01 NOTE — Patient Instructions (Signed)
COVID-19: How to Protect Yourself and Others Know how it spreads  There is currently no vaccine to prevent coronavirus disease 2019 (COVID-19).  The best way to prevent illness is to avoid being exposed to this virus.  The virus is thought to spread mainly from person-to-person. ? Between people who are in close contact with one another (within about 6 feet). ? Through respiratory droplets produced when an infected person coughs, sneezes or talks. ? These droplets can land in the mouths or noses of people who are nearby or possibly be inhaled into the lungs. ? Some recent studies have suggested that COVID-19 may be spread by people who are not showing symptoms. Everyone should Clean your hands often  Wash your hands often with soap and water for at least 20 seconds especially after you have been in a public place, or after blowing your nose, coughing, or sneezing.  If soap and water are not readily available, use a hand sanitizer that contains at least 60% alcohol. Cover all surfaces of your hands and rub them together until they feel dry.  Avoid touching your eyes, nose, and mouth with unwashed hands. Avoid close contact  Stay home if you are sick.  Avoid close contact with people who are sick.  Put distance between yourself and other people. ? Remember that some people without symptoms may be able to spread virus. ? This is especially important for people who are at higher risk of getting very sick.www.cdc.gov/coronavirus/2019-ncov/need-extra-precautions/people-at-higher-risk.html Cover your mouth and nose with a cloth face cover when around others  You could spread COVID-19 to others even if you do not feel sick.  Everyone should wear a cloth face cover when they have to go out in public, for example to the grocery store or to pick up other necessities. ? Cloth face coverings should not be placed on young children under age 2, anyone who has trouble breathing, or is unconscious,  incapacitated or otherwise unable to remove the mask without assistance.  The cloth face cover is meant to protect other people in case you are infected.  Do NOT use a facemask meant for a healthcare worker.  Continue to keep about 6 feet between yourself and others. The cloth face cover is not a substitute for social distancing. Cover coughs and sneezes  If you are in a private setting and do not have on your cloth face covering, remember to always cover your mouth and nose with a tissue when you cough or sneeze or use the inside of your elbow.  Throw used tissues in the trash.  Immediately wash your hands with soap and water for at least 20 seconds. If soap and water are not readily available, clean your hands with a hand sanitizer that contains at least 60% alcohol. Clean and disinfect  Clean AND disinfect frequently touched surfaces daily. This includes tables, doorknobs, light switches, countertops, handles, desks, phones, keyboards, toilets, faucets, and sinks. www.cdc.gov/coronavirus/2019-ncov/prevent-getting-sick/disinfecting-your-home.html  If surfaces are dirty, clean them: Use detergent or soap and water prior to disinfection.  Then, use a household disinfectant. You can see a list of EPA-registered household disinfectants here. cdc.gov/coronavirus 06/05/2018 This information is not intended to replace advice given to you by your health care provider. Make sure you discuss any questions you have with your health care provider. Document Released: 05/15/2018 Document Revised: 06/13/2018 Document Reviewed: 05/15/2018 Elsevier Patient Education  2020 Elsevier Inc.  

## 2019-01-01 NOTE — Progress Notes (Signed)
I called the patient after we were notified by Sandi Mealy, PAC and Bary Castilla RN about the patient's situation. She is currently in terrible pain in what sounds like her lumbar spine. She was sent to the ED and received pain medication and is planning to discharge home. I couldn't get through to the patient so I spoke with her husband Larkin Ina and we discussed cancelling today's treatment, coming in tomorrow around 2:30 pm to simulate the lower spine area and to place a guidewire on the site where she's having pain, and to then proceed with her radiation to the pelvis that she's currently receiving. We would likely plan to start her treatment to any other sites early next week. I did leave a message for her friend Ailene Ravel who her husband asked me to call about this plan.

## 2019-01-01 NOTE — Telephone Encounter (Signed)
Pt called with c/o left lower pain under rib cage. Relate pain taking breath away and feels like "organs are having spasms".  Called and discussed with [name], [place]. Pt to come in and see [name] and have lab work done. Called pt, she will come in to [facility] now.

## 2019-01-01 NOTE — ED Provider Notes (Signed)
[facility] DEPT Provider Note   CSN: [id number] Arrival date & time: 01/01/19  1151     History   Chief Complaint Chief Complaint  Patient presents with  . Back Pain    HPI Laura Anthony is a 43 y.o. female.  Presents emerged part with a chief complaint of back pain.  States she has had various pain related to her metastatic disease, but not pain is severe or in this particular location.  Pain is worse on her left side, low back.  Sharp, stabbing, severe, up to 10 out of 10.  Pain was improved with dose of fentanyl.  No vomiting or nausea.  No dysuria or hematuria.  No fevers.  She was recently admitted to hospital a week ago for neutropenic fever.  States her cultures were negative and she was discharged after 48 hours of observation.  No fever since.  Seen at oncology office earlier today and was recommended to go to ER for further imaging and pain management.  Past medical history metastatic breast cancer.  Currently in between rounds of chemotherapy.     HPI  Past Medical History:  Diagnosis Date  . ADD (attention deficit disorder)   . Anemia   . Anxiety   . Breast cancer, left breast ([place])    S/P mastectomy 03/27/2017  . DVT (deep venous thrombosis) ([place]) 2017   calf left - probably due to [facility] pills-took eliquis x3 mos, nonthing now  . High cholesterol   . Impingement syndrome of right shoulder 07/2013  . Migraine    "usually 1/month" (03/28/2017)  . PONV (postoperative nausea and vomiting)   . Right bicipital tenosynovitis 07/2013  . Rotator cuff impingement syndrome of right shoulder 07/12/2013  . Seizures ([place])    x 1 as a child - was never on anticonvulsants (03/28/2017)    Patient Active Problem List   Diagnosis Date Noted  . 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 Metastatic breast cancer ([place]) 12/10/2018  . Neck pain 12/09/2018  .  Hypothyroidism 12/09/2018  . Goals of care, counseling/discussion 12/06/2018  . Bone metastases ([place]) 08/07/2018  . Pain from bone metastases ([place]) 08/07/2018  . Family history of breast cancer 04/02/2018  . Family history of prostate cancer 04/02/2018  . History of therapeutic radiation 10/16/2017  . Acquired absence of both breasts 04/03/2017  . Breast cancer, stage 2, left ([place]) 03/27/2017  . Port-A-Cath in place 12/16/2016  . Genetic testing 12/06/2016  . Encounter for antineoplastic chemotherapy 11/25/2016  . Malignant neoplasm of lower-outer quadrant of left breast of female, estrogen receptor negative ([place]) 10/27/2016  . Breast lump on left side at 1 o'clock position 05/22/2015  . Migraine without aura or status migrainosus 05/29/2014  . Hyperlipidemia LDL goal <100 10/22/2013  . Depression 09/09/2013  . Rotator cuff impingement syndrome of left shoulder 07/12/2013  . ADD (attention deficit disorder) 01/09/2013  . Anxiety 12/13/2012  . Insomnia 12/13/2012    Past Surgical History:  Procedure Laterality Date  . ADENOIDECTOMY  1981  . ANKLE ARTHROSCOPY Right   . BREAST BIOPSY Left 10/2016  . KNEE ARTHROSCOPY Right   . KNEE ARTHROSCOPY W/ ACL RECONSTRUCTION Left   . LIPOSUCTION WITH LIPOFILLING Bilateral 01/05/2018   Procedure: LIPOFILLING FROM ABDOMEN TO BILATERAL CHEST;  Surgeon: Irene [place], MD;  Location: [facility];  Service: Plastics;  Laterality: Bilateral;  . MASTECTOMY Left 03/27/2017  NIPPLE SPARING MASTECTOMY WITH RADIOACTIVE SEED TARGETED LYMPH NODE EXCISION AND LEFT AXILLARY SENTINEL LYMPH NODE BIOPSY  . MASTECTOMY Right 03/27/2017   RIGHT PROPHYLACTIC NIPPLE SPARING MASTECTOMY  . NIPPLE SPARING MASTECTOMY Right 03/27/2017   Procedure: RIGHT PROPHYLACTIC NIPPLE SPARING MASTECTOMY;  Surgeon: Rolm Bookbinder, MD;  Location: [facility];  Service: General;  Laterality: Right;  . PORT-A-CATH REMOVAL Right 01/05/2018   Procedure:  REMOVAL RIGHT CHEST PORT;  Surgeon: Irene [place], MD;  Location: [facility];  Service: Plastics;  Laterality: Right;  . PORTACATH PLACEMENT N/A 11/01/2016   Procedure: INSERTION PORT-A-CATH WITH [place];  Surgeon: Rolm Bookbinder, MD;  Location: [facility];  Service: General;  Laterality: N/A;  . PORTACATH PLACEMENT N/A 08/23/2018   Procedure: INSERTION PORT-A-CATH WITH ULTRASOUND;  Surgeon: Rolm Bookbinder, MD;  Location: [facility];  Service: General;  Laterality: N/A;  . RADIOACTIVE SEED GUIDED AXILLARY SENTINEL LYMPH NODE Left 03/27/2017   Procedure: LEFT NIPPLE SPARING MASTECTOMY WITH RADIOACTIVE SEED TARGETED LYMPH NODE EXCISION AND LEFT AXILLARY SENTINEL LYMPH NODE BIOPSY;  Surgeon: Rolm Bookbinder, MD;  Location: [facility];  Service: General;  Laterality: Left;  REQUESTS RNFA  . RECONSTRUCTION BREAST IMMEDIATE / DELAYED W/ TISSUE EXPANDER Bilateral 03/27/2017   BILATERAL BREAST RECONSTRUCTION WITH PLACEMENT OF TISSUE EXPANDER AND ALLODERM  . REMOVAL OF BILATERAL TISSUE EXPANDERS WITH PLACEMENT OF BILATERAL BREAST IMPLANTS Bilateral 01/05/2018   Procedure: REMOVAL OF BILATERAL TISSUE EXPANDERS WITH PLACEMENT OF BILATERAL BREAST IMPLANTS;  Surgeon: Irene Limbo, MD;  Location: [facility];  Service: Plastics;  Laterality: Bilateral;  . SEPTOPLASTY WITH ETHMOIDECTOMY, AND MAXILLARY ANTROSTOMY  10/29/2010   bilat. max. antrostomy with left max. stripping; left ant. ethmoidectomy; right total ethmoidectomy; sphenoidotomy  . SHOULDER ARTHROSCOPY WITH SUBACROMIAL DECOMPRESSION AND BICEP TENDON REPAIR Right 07/12/2013   Procedure: RIGHT SHOULDER ARTHROSCOPY DEBRIDEMENT EXTENSIVE DECOMPRESSION SUBACROMIAL PARTIAL ACROMIOPLASTY;  Surgeon: Johnny Bridge, MD;  Location: [facility];  Service: Orthopedics;  Laterality: Right;  . WRIST ARTHROSCOPY  01/17/2012   Procedure: ARTHROSCOPY WRIST; right wrist Surgeon: Tennis Must,  MD;  Location: [facility];  Service: Orthopedics;  Laterality: Right;  RIGHT WRIST ARTHROSCOPY WITH TRIANGULAR FIBROCARTILAGE COMPLEX REPAIR AND DEBRIDEMENT      OB History   No obstetric history on file.      Home Medications    Prior to Admission medications   Medication Sig Start Date End Date Taking? Authorizing Provider  acetaminophen (TYLENOL) 500 MG tablet Take 1,000 mg by mouth every 8 (eight) hours.     [provider]  ELIQUIS 5 MG TABS tablet Take 5 mg by mouth 2 (two) times daily. 11/21/18   [provider]  FLUoxetine (PROZAC) 40 MG capsule Take 40 mg by mouth every morning.     [provider]  gabapentin (NEURONTIN) 300 MG capsule Take 300 mg by mouth every evening.  11/13/18   [provider]  levofloxacin (LEVAQUIN) 750 MG tablet Take by mouth. 12/28/18 01/02/19  [provider]  levothyroxine (SYNTHROID) 88 MCG tablet TAKE 1 TABLET (88 MCG TOTAL) BY MOUTH DAILY BEFORE BREAKFAST. 12/28/18   Nicholas Lose, MD  lidocaine (XYLOCAINE) 2 % solution Use as directed 10 mLs in the mouth or throat every 6 (six) hours as needed for mouth pain. 12/25/18   Kyung Rudd, MD  lidocaine-prilocaine (EMLA) cream APPLY 1 APPLICATION TOPICALLY AS NEEDED. Patient taking differently: Apply 1 application topically as needed (for pain).  12/06/18   Lindi Adie,  Vinay, MD  lisdexamfetamine (VYVANSE) 30 MG capsule Take 30 mg by mouth every morning.     [provider]  morphine (MS CONTIN) 30 MG 12 hr tablet Take 30 mg by mouth every 12 (twelve) hours.  12/07/18   [provider]  ondansetron (ZOFRAN) 8 MG tablet Take 1 tablet (8 mg total) by mouth 2 (two) times daily as needed (Nausea or vomiting). 12/06/18   Nicholas Lose, MD  Oxycodone HCl 10 MG TABS Take 1 tablet (10 mg total) by mouth every 6 (six) hours as needed. Patient taking differently: Take 10 mg by mouth every 6 (six) hours as needed (for breakthrough pain).  12/06/18    Nicholas Lose, MD  polyethylene glycol (MIRALAX / GLYCOLAX) 17 g packet Take 17 g by mouth daily as needed for mild constipation or moderate constipation. 12/14/18   Mercy Riding, MD  predniSONE (DELTASONE) 10 MG tablet Take 3 tablets (30 mg total) by mouth daily with breakfast for 5 days, THEN 2 tablets (20 mg total) daily with breakfast for 7 days, THEN 1 tablet (10 mg total) daily with breakfast for 7 days, THEN 0.5 tablets (5 mg total) daily with breakfast for 7 days. 12/15/18 01/10/19  Mercy Riding, MD  prochlorperazine (COMPAZINE) 10 MG tablet Take 1 tablet (10 mg total) by mouth every 6 (six) hours as needed (Nausea or vomiting). 12/06/18   Nicholas Lose, MD  rizatriptan (MAXALT) 10 MG tablet Take 10 mg by mouth daily as needed for migraine (and may repeat once in 2 hours, if no relief).     [provider]  senna-docusate (SENOKOT-S) 8.6-50 MG tablet Take 1 tablet by mouth 2 (two) times daily. 12/14/18   Mercy Riding, MD  spironolactone (ALDACTONE) 100 MG tablet Take 100 mg by mouth daily.     [provider]  traZODone (DESYREL) 50 MG tablet Take 1 tablet (50 mg total) by mouth at bedtime as needed for sleep. 12/14/18   Mercy Riding, MD    Family History Family History  Problem Relation Age of Onset  . Breast cancer Mother 4       triple negative  . Leukemia Father   . Lung cancer Father   . Heart attack Maternal Uncle   . Prostate cancer Paternal Uncle   . COPD Paternal Grandmother   . Heart disease Paternal Grandfather   . Prostate cancer Paternal Uncle   . Leukemia Cousin     Social History Social History   Tobacco Use  . Smoking status: Never Smoker  . Smokeless tobacco: Never Used  Substance Use Topics  . Alcohol use: Yes    Comment: Drinks very rare  . Drug use: No     Allergies   Sumatriptan, Statins, and Tape   Review of Systems Review of Systems  Constitutional: Negative for chills and fever.  HENT: Negative for ear pain and sore  throat.   Eyes: Negative for pain and visual disturbance.  Respiratory: Negative for cough and shortness of breath.   Cardiovascular: Negative for chest pain and palpitations.  Gastrointestinal: Negative for abdominal pain and vomiting.  Genitourinary: Negative for dysuria and hematuria.  Musculoskeletal: Positive for back pain. Negative for arthralgias.  Skin: Negative for color change and rash.  Neurological: Negative for seizures and syncope.  All other systems reviewed and are negative.    Physical Exam Updated Vital Signs BP 133/88 (BP Location: Left Arm)   Pulse 89   Temp 98.4 F (36.9 C) (  Oral)   Resp (!) 23   SpO2 100%   Physical Exam Vitals signs and nursing note reviewed.  Constitutional:      General: She is not in acute distress.    Appearance: She is well-developed.  HENT:     Head: Normocephalic and atraumatic.  Eyes:     Conjunctiva/sclera: Conjunctivae normal.  Neck:     Musculoskeletal: Neck supple.  Cardiovascular:     Rate and Rhythm: Normal rate and regular rhythm.     Heart sounds: No murmur.  Pulmonary:     Effort: Pulmonary effort is normal. No respiratory distress.     Breath sounds: Normal breath sounds.  Abdominal:     Palpations: Abdomen is soft.     Tenderness: There is no abdominal tenderness.  Musculoskeletal:     Comments: TTP across lumbar region, no focal midline TTP  Skin:    General: Skin is warm and dry.  Neurological:     Mental Status: She is alert.     Comments: 5/5 strength in LEs, sensation to light touch intact in LEs      ED Treatments / Results  Labs (all labs ordered are listed, but only abnormal results are displayed) Labs Reviewed  URINALYSIS, ROUTINE W REFLEX MICROSCOPIC - Abnormal; Notable for the following components:      Result Value   Ketones, ur 5 (*)    Protein, ur 30 (*)    Bacteria, UA RARE (*)    All other components within normal limits    EKG None  Radiology No results found.  Procedures  Procedures (including critical care time)  Medications Ordered in ED Medications  fentaNYL (SUBLIMAZE) injection 50 mcg (50 mcg Intravenous Given 01/01/19 1242)     Initial Impression / Assessment and Plan / ED Course  I have reviewed the triage vital signs and the nursing notes.  Pertinent labs & imaging results that were available during my care of the patient were reviewed by me and considered in my medical decision making (see chart for details).  Clinical Course as of Dec 31 1524  Tue Jan 01, 2019  1501 Recheck patient, pain drastically improved, states minimal at this time   [RD]    Clinical Course User Index [RD] Lucrezia Starch, MD       43 year old lady metastatic breast cancer with known metastases to spine presents to ER with acute on chronic low back pain.  On initial exam patient noted to be very uncomfortable in severe pain.  CT scan demonstrated stable mets, multiple levels throughout thoracic and lumbar spine.  No new findings though.  No acute abdominal or pelvic process.  Urine negative for infection.  Labs completed at oncology office earlier in the morning were within normal limits.  After receiving a couple doses of IV pain medicine, patient had drastic improvement in her pain and she feels well and not controlled to go home.  Believe patient is appropriate for discharge and outpatient management.  She has close follow-up already scheduled with her oncologist.  Reviewed return precautions, will discharge home.    After the discussed management above, the patient was determined to be safe for discharge.  The patient was in agreement with this plan and all questions regarding their care were answered.  ED return precautions were discussed and the patient will return to the ED with any significant worsening of condition.    Final Clinical Impressions(s) / ED Diagnoses   Final diagnoses:  Spine metastasis ([place])  Low back pain, unspecified back pain laterality,  unspecified chronicity, unspecified whether sciatica present    ED Discharge Orders    None       Lucrezia Starch, MD 01/01/19 1528

## 2019-01-01 NOTE — ED Notes (Signed)
Per [name], patient has metastatic breast cancer-acute back pain-possible compression fracture-coming for pain control

## 2019-01-01 NOTE — ED Triage Notes (Signed)
Pt presents from cancer center crying out in pain with back pain and spasms.  Patient reports metastatic breast cancer with 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 and bones.  Patient reportedly received 2mg  of morphine at cancer center

## 2019-01-01 NOTE — Telephone Encounter (Signed)
The patient called me back and we reviewed her schedule for tomorrow. She is worried about not having any medication at home to take other than her MS contin 30 mg BID, and Oxycodone 10 mg q 6 hours prn. We reviewed the medications she received today which I believe was 50 mcg of IV Fentanyl in the ED, 2 mg IV Dilaudid in the ED, and 2 mg IV morphine given twice in the cancer center prior to the ED. We discussed the morphine conversion factors and I believe that she has had between 20-30 mg of morphine metabolites which has helped her pain. We discussed the rationale to consider off label adjustment in her MS Contin. Instead of BID, it would be reasonable to consider taking this TID. She is aware of off label recommendations for this but perhaps this could really help her while awaiting relief from her bone disease as we suspect she will need more radiotherapy. She has been given instructions on the next time to take her prn medications and plans to take her pm dose of MS Contin. She will record the number of times she needs to take prn Oxycodone in the next 24 hours along with the adjustment in the MS Contin so we can decide if any further modifications are needed. She is in agreement with this plan. The side effect profiles were again reviewed with the patient.

## 2019-01-01 NOTE — Discharge Instructions (Addendum)
Please take your previously prescribed pain medicine.  If you develop fever, numbness, weakness, worsening pain, please return to the emergency department for reassessment or discuss with your oncologist.

## 2019-01-01 NOTE — [place] Notes (Signed)
VO to d/c urine sample per PA Van.  Lab aware.  Pt transported to [facility] via [facility]c with belongings and PA [name] who will give bedside report.

## 2019-01-01 NOTE — Progress Notes (Signed)
Sympt[facility] Management [facility] Progress Note   Laura Anthony October 12, 1975 43 y.o.  Laura Anthony is managed by Dr. Nicholas Lose  Actively treated with chemotherapy/immunotherapy/hormonal therapy: yes  Current therapy: Halaven days 1 and 8 every 3 weeks with Herceptin and Perjeta every 3 weeks, today cycle 1 day 8 Halaven (cycle 1 , day 8 on 12/21/2018), Xgeva, and palliative radiation to sternal, sacral &pelvic lesions and SRS for T3 and C7-T1 lesions.  Next scheduled appointment with provider: 01/03/2019  Assessment: Plan:    Acute midline low back pain without sciatica - Plan: morphine 4 MG/ML injection 2 mg, 0.9 %  sodium chloride infusion  Pain crisis  Metastatic breast cancer (HCC)  Bone metastases (HCC)   Metastatic breast cancer with bony metastases: Laura Anthony is currently treated with Halaven days 1 and 8 every 3 weeks with Herceptin and Perjeta every 3 weeks, today cycle 1 day 8 Halaven (cycle 1 , day 8 on 12/21/2018), Xgeva, and palliative radiation to sternal, sacral &pelvic lesions and SRS for T3 and C7-T1 lesions. She is scheduled to be seen by Dr. Lindi Adie next on 01/03/2019.  Pain crisis with left lower back pain: Laura Anthony was given morphine sulfate 2 mg IV x 1 at 11:25 AM. There was little benefit from this intervention. Laura Anthony was transported to the emergency room for evaluation and management.  Please see After Visit Summary for patient specific instructions.  Future Appointments  Date Time Provider [place]  01/01/2019  2:45 PM [facility] [id number] [facility] None  01/02/2019  2:45 PM [facility] [id number] [facility] None  01/03/2019  9:00 AM [facility] [id number] [facility] None  01/03/2019 10:45 AM [facility] LAB 6 [facility] None  01/03/2019 11:00 AM [facility] [place] FLUSH [facility] None  01/03/2019 11:30 AM Nicholas Lose, MD [facility] None  01/03/2019 12:30 PM [facility] INFUSION [facility] None  01/07/2019  11:10 AM [facility] MR 2 [facility]315MRI [facility] [address]  01/10/2019  8:00 AM [facility] LAB 5 [facility] None  01/10/2019  8:15 AM [facility] FLUSH [facility] None  01/10/2019  9:15 AM [facility] INFUSION [facility] None  01/24/2019 10:45 AM [facility] LAB 2 [facility] None  01/24/2019 11:00 AM [facility] [place] FLUSH [facility] None  01/24/2019 11:30 AM Nicholas Lose, MD [facility] None  01/24/2019 12:30 PM [facility] INFUSION [facility] None  06/21/2019 10:45 AM Nicholas Lose, MD [facility] None    No orders of the defined types were placed in this encounter.      Subjective:   Patient ID:  Laura Anthony is a 43 y.o. (DOB [birth year]) female.  Chief Complaint:  Chief Complaint  Patient presents with   Back Pain    HPI Laura Anthony is a 43 y.o. female with a diagnosis of a metastatic breast cancer with bony metastases. She is followed by Dr. Lindi Adie and is currently treated with Halaven days 1 and 8 every 3 weeks with Herceptin and Perjeta every 3 weeks, today cycle 1 day 8 Halaven (cycle 1 , day 8 on 12/21/2018), Xgeva, and palliative radiation to sternal, sacral &pelvic lesions and SRS for T3 and C7-T1 lesions. She has had 2 recent hospitalizations. One at [facility] and one last week in [facility] when she was treated for febrile neutropenia. She presents with acute left back pain today which began after awakening. She denies trauma or changes in activity. This provider was called to the front of the cancer center to see this patient as she was being checked in for her visit. She was crying  out in pain. Laura Anthony was given morphine sulfate 2 mg IV x 1 at 11:25 AM. There was little benefit from this intervention. Her case was discussed with Dr. Lindi Adie prior to her being transported to the emergency room for evaluation and management.  Medications: I have reviewed the patient's current medications.  Allergies:  Allergies  Allergen Reactions    Sumatriptan Other (See Comments)    Numbness to face    Statins Other (See Comments)    Leg pain   Tape Rash and Other (See Comments)    Rash from dressing over port-a-cath     Past Medical History:  Diagnosis Date   ADD (attention deficit disorder)    Anemia    Anxiety    Breast cancer, left breast ([place])    S/P mastectomy 03/27/2017   DVT (deep venous thrombosis) ([place]) 2017   calf left - probably due to [facility] pills-took eliquis x3 mos, nonthing now   High cholesterol    Impingement syndrome of right shoulder 07/2013   Migraine    "usually 1/month" (03/28/2017)   PONV (postoperative nausea and vomiting)    Right bicipital tenosynovitis 07/2013   Rotator cuff impingement syndrome of right shoulder 07/12/2013   Seizures ([place])    x 1 as a child - was never on anticonvulsants (03/28/2017)    Past Surgical History:  Procedure Laterality Date   ADENOIDECTOMY  1981   ANKLE ARTHROSCOPY Right    BREAST BIOPSY Left 10/2016   KNEE ARTHROSCOPY Right    KNEE ARTHROSCOPY W/ ACL RECONSTRUCTION Left    LIPOSUCTION WITH LIPOFILLING Bilateral 01/05/2018   Procedure: LIPOFILLING FROM ABDOMEN TO BILATERAL CHEST;  Surgeon: Irene Limbo, MD;  Location: [place];  Service: Plastics;  Laterality: Bilateral;   MASTECTOMY Left 03/27/2017   NIPPLE SPARING MASTECTOMY WITH RADIOACTIVE SEED TARGETED LYMPH NODE EXCISION AND LEFT AXILLARY SENTINEL LYMPH NODE BIOPSY   MASTECTOMY Right 03/27/2017   RIGHT PROPHYLACTIC NIPPLE SPARING MASTECTOMY   NIPPLE SPARING MASTECTOMY Right 03/27/2017   Procedure: RIGHT PROPHYLACTIC NIPPLE SPARING MASTECTOMY;  Surgeon: Rolm Bookbinder, MD;  Location: [place];  Service: General;  Laterality: Right;   PORT-A-CATH REMOVAL Right 01/05/2018   Procedure: REMOVAL RIGHT CHEST PORT;  Surgeon: Irene Limbo, MD;  Location: [place];  Service: Plastics;  Laterality: Right;   PORTACATH PLACEMENT N/A  11/01/2016   Procedure: INSERTION PORT-A-CATH WITH [place];  Surgeon: Rolm Bookbinder, MD;  Location: [place];  Service: General;  Laterality: N/A;   PORTACATH PLACEMENT N/A 08/23/2018   Procedure: INSERTION PORT-A-CATH WITH ULTRASOUND;  Surgeon: Rolm Bookbinder, MD;  Location: [place];  Service: General;  Laterality: N/A;   RADIOACTIVE SEED GUIDED AXILLARY SENTINEL LYMPH NODE Left 03/27/2017   Procedure: LEFT NIPPLE SPARING MASTECTOMY WITH RADIOACTIVE SEED TARGETED LYMPH NODE EXCISION AND LEFT AXILLARY SENTINEL LYMPH NODE BIOPSY;  Surgeon: Rolm Bookbinder, MD;  Location: [place];  Service: General;  Laterality: Left;  REQUESTS RNFA   RECONSTRUCTION BREAST IMMEDIATE / DELAYED W/ TISSUE EXPANDER Bilateral 03/27/2017   BILATERAL BREAST RECONSTRUCTION WITH PLACEMENT OF TISSUE EXPANDER AND ALLODERM   REMOVAL OF BILATERAL TISSUE EXPANDERS WITH PLACEMENT OF BILATERAL BREAST IMPLANTS Bilateral 01/05/2018   Procedure: REMOVAL OF BILATERAL TISSUE EXPANDERS WITH PLACEMENT OF BILATERAL BREAST IMPLANTS;  Surgeon: Irene Limbo, MD;  Location: [place];  Service: Plastics;  Laterality: Bilateral;   SEPTOPLASTY WITH ETHMOIDECTOMY, AND MAXILLARY ANTROSTOMY  10/29/2010   bilat. max. antrostomy with left max. stripping; left  ant. ethmoidectomy; right total ethmoidectomy; sphenoidotomy   SHOULDER ARTHROSCOPY WITH SUBACROMIAL DECOMPRESSION AND BICEP TENDON REPAIR Right 07/12/2013   Procedure: RIGHT SHOULDER ARTHROSCOPY DEBRIDEMENT EXTENSIVE DECOMPRESSION SUBACROMIAL PARTIAL ACROMIOPLASTY;  Surgeon: Johnny Bridge, MD;  Location: [place];  Service: Orthopedics;  Laterality: Right;   WRIST ARTHROSCOPY  01/17/2012   Procedure: ARTHROSCOPY WRIST; right wrist Surgeon: Tennis Must, MD;  Location: [place];  Service: Orthopedics;  Laterality: Right;  RIGHT WRIST ARTHROSCOPY WITH TRIANGULAR FIBROCARTILAGE COMPLEX REPAIR AND  DEBRIDEMENT     Family History  Problem Relation Age of Onset   Breast cancer Mother 24       triple negative   Leukemia Father    Lung cancer Father    Heart attack Maternal Uncle    Prostate cancer Paternal Uncle    COPD Paternal Grandmother    Heart disease Paternal Grandfather    Prostate cancer Paternal Uncle    Leukemia Cousin     Social History   Socioeconomic History   Marital status: Married    Spouse name: Not on file   Number of children: Not on file   Years of education: Not on file   Highest education level: Not on file  Occupational History   Not on file  Social Needs   Financial resource strain: Not on file   Food insecurity    Worry: Not on file    Inability: Not on file   Transportation needs    Medical: Not on file    Non-medical: Not on file  Tobacco Use   Smoking status: Never Smoker   Smokeless tobacco: Never Used  Substance and Sexual Activity   Alcohol use: Yes    Comment: Drinks very rare   Drug use: No   Sexual activity: Yes    Birth control/protection: Surgical    Comment: husband has had a vasectomy  Lifestyle   Physical activity    Days per week: Not on file    Minutes per session: Not on file   Stress: Not on file  Relationships   Social connections    Talks on phone: Not on file    Gets together: Not on file    Attends religious service: Not on file    Active member of club or organization: Not on file    Attends meetings of clubs or organizations: Not on file    Relationship status: Not on file   Intimate partner violence    Fear of current or ex partner: Not on file    Emotionally abused: Not on file    Physically abused: Not on file    Forced sexual activity: Not on file  Other Topics Concern   Not on file  Social History Narrative   Not on file    Past Medical History, Surgical history, Social history, and Family history were reviewed and updated as appropriate.   Please see review of  systems for further details on the patient's review from today.   Review of Systems:  Review of Systems  Constitutional: Negative for activity change, appetite change, chills, diaphoresis and fever.  Respiratory: Negative for cough, chest tightness and shortness of breath.   Cardiovascular: Negative for chest pain, palpitations and leg swelling.  Gastrointestinal: Negative for abdominal distention, abdominal pain, constipation, diarrhea, nausea and vomiting.  Musculoskeletal: Positive for back pain, gait problem and myalgias.    Objective:   Physical Exam:  BP 116/81 (BP Location: Right Arm, Patient Position: Sitting)  Pulse 86    Temp 98.2 F (36.8 C) (Oral)    Resp (!) 22    SpO2 96%  ECOG: 1  Physical Exam Constitutional:      General: She is in acute distress.     Appearance: She is not diaphoretic.     Comments: The patient is an adult female who is ambulating with the use of a wheelchair and is in significant left back and lower rib pain.  The remainder of the physical exam had to be deferred due to her pain level which was reported as 10/10 spasm-like pain in the left back and lower left ribs.  HENT:     Head: Normocephalic and atraumatic.  Skin:    General: Skin is warm and dry.     Coloration: Skin is not pale.     Findings: No erythema or rash.  Neurological:     Mental Status: She is alert.     Gait: Gait abnormal (The patient is ambulating with the use of a wheelchair.).     Lab Review:     Component Value Date/Time   NA 141 01/01/2019 1129   NA 138 01/27/2017 0802   K 3.6 01/01/2019 1129   K 4.1 01/27/2017 0802   CL 100 01/01/2019 1129   CO2 30 01/01/2019 1129   CO2 26 01/27/2017 0802   GLUCOSE 78 01/01/2019 1129   GLUCOSE 90 01/27/2017 0802   BUN 13 01/01/2019 1129   BUN 14.0 01/27/2017 0802   CREATININE 0.85 01/01/2019 1129   CREATININE 0.7 01/27/2017 0802   CALCIUM 9.3 01/01/2019 1129   CALCIUM 8.6 01/27/2017 0802   PROT 7.0 01/01/2019 1129    PROT 6.5 01/27/2017 0802   ALBUMIN 3.7 01/01/2019 1129   ALBUMIN 3.5 01/27/2017 0802   AST 51 (H) 01/01/2019 1129   AST 22 01/27/2017 0802   ALT 59 (H) 01/01/2019 1129   ALT 23 01/27/2017 0802   ALKPHOS 104 01/01/2019 1129   ALKPHOS 85 01/27/2017 0802   BILITOT 0.2 (L) 01/01/2019 1129   BILITOT 0.23 01/27/2017 0802   GFRNONAA >60 01/01/2019 1129   GFRAA >60 01/01/2019 1129       Component Value Date/Time   WBC 7.4 01/01/2019 1129   WBC 7.3 12/14/2018 0546   RBC 3.00 (L) 01/01/2019 1129   HGB 10.3 (L) 01/01/2019 1129   HGB 11.1 (L) 01/27/2017 0802   HCT 31.9 (L) 01/01/2019 1129   HCT 33.4 (L) 01/27/2017 0802   PLT 156 01/01/2019 1129   PLT 170 01/27/2017 0802   MCV 106.3 (H) 01/01/2019 1129   MCV 99.0 01/27/2017 0802   MCH 34.3 (H) 01/01/2019 1129   MCHC 32.3 01/01/2019 1129   RDW 17.0 (H) 01/01/2019 1129   RDW 18.2 (H) 01/27/2017 0802   LYMPHSABS PENDING 01/01/2019 1129   LYMPHSABS 1.7 01/27/2017 0802   MONOABS PENDING 01/01/2019 1129   MONOABS 0.5 01/27/2017 0802   EOSABS PENDING 01/01/2019 1129   EOSABS 0.0 01/27/2017 0802   BASOSABS PENDING 01/01/2019 1129   BASOSABS 0.0 01/27/2017 0802   -------------------------------  Imaging from last 24 hours (if applicable):  Radiology interpretation: Ct Soft Tissue Neck W Contrast  Result Date: 12/09/2018 CLINICAL DATA:  43 year old female with metastatic breast cancer and increasing neck pain and swelling. Currently on chemotherapy. Query as feces syndrome. EXAM: CT NECK WITH CONTRAST TECHNIQUE: Multidetector CT imaging of the neck was performed using the standard protocol following the bolus administration of intravenous contrast. CONTRAST:  143mL OMNIPAQUE IOHEXOL  300 MG/ML SOLN in conjunction with contrast enhanced imaging of the chest reported separately. COMPARISON:  Chest CT today reported separately. Brain MRI 08/02/2018. FINDINGS: Pharynx and larynx: The glottis is closed. Laryngeal and pharyngeal soft tissue  contours are within normal limits. Negative parapharyngeal spaces. Negative retropharyngeal space. Salivary glands: Negative sublingual space. Submandibular glands and parotid glands are within normal limits. Thyroid: Diminutive, negative. Lymph nodes: No cervical lymphadenopathy. Diminutive bilateral cervical nodes. Vascular: The bilateral internal jugular veins are patent. The left IJ is dominant but there is a right IJ approach porta cath in place. The visible bilateral subclavian veins are patent. 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 See also chest CT findings today reported separately. In the neck and at the skull base the major vascular structures are patent. Limited intracranial: Negative. Visualized orbits: Negative. Mastoids and visualized paranasal sinuses: Clear. Skeleton: Lytic metastasis in the C3 vertebral body is eccentric to the right and breaks through the posterior cortex (series 14, image 49 and sagittal image 48. No obvious epidural extension. There are patent epidural veins overlying the lesion. No other osseous metastasis identified in the neck. Upper chest: Reported separately today. IMPRESSION: 1. Patent major vascular structures in the neck and at the skull base. 2. 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 The See CT Chest today reported separately. 3. Lytic C3 vertebral body metastasis which has broken through the posterior cortex, but no epidural tumor is evident by CT. 4. No other metastatic disease identified in the neck. Electronically Signed   By: Genevie Ann M.D.   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 CONTRAST:  178mL OMNIPAQUE IOHEXOL 300 MG/ML  SOLN COMPARISON:  PET-CT, 08/02/2018 FINDINGS: Cardiovascular: Interval placement of a right chest port catheter. There is new narrowing of the right brachiocephalic vein about the port catheter tubing (series 9, image 46) with new prominent small internal mammary and chest wall venous collaterals about the chest. Normal heart size. No pericardial effusion. Mediastinum/Nodes: No enlarged mediastinal, hilar, or axillary lymph nodes. Thyroid gland, trachea, and esophagus demonstrate no significant findings. Lungs/Pleura: There are numerous bilateral pulmonary nodules of varying sizes, a number of dominant nodule seen on initial staging examination dated 08/02/2018 not significantly changed in size, however with numerous new small pulmonary nodules throughout the lungs, for example a 5 mm nodule in the left lower lobe (series 11, image 86) and adjacent 2-3 mm nodules of the right lower lobe (series 11, image 113). No pleural effusion or pneumothorax. Upper Abdomen: No acute abnormality. Hepatic steatosis. Unchanged small left adrenal nodule, partially imaged. Musculoskeletal: Status post bilateral mastectomy and implant reconstruction. IMPRESSION: 1. Interval placement of a right chest port catheter. There is new narrowing of the right brachiocephalic vein about the port catheter tubing (series 9, image 46) with new prominent small internal mammary and chest wall venous collaterals about the chest. 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 The superior vena cava itself is widely patent to the cavoatrial junction. A specifically tailored multiphasic CT central venogram may be helpful to further assess functional degree of stenosis and diagnostic/therapeutic interventional  venogram can be considered. 2. There are numerous bilateral pulmonary nodules of varying sizes, a number of  dominant nodule seen on initial staging examination dated 08/02/2018 not significantly changed in size, however with numerous new small pulmonary nodules throughout the lungs, for example a 5 mm nodule in the left lower lobe (series 11, image 86) and adjacent 2-3 mm nodules of the right lower lobe (series 11, image 113). Findings are consistent with worsened pulmonary metastatic disease. Electronically Signed   By: Eddie Candle M.D.   On: 12/09/2018 19:24   Ct Pelvis Wo Contrast  Result Date: 12/11/2018 CLINICAL DATA:  Abnormal x-ray. EXAM: CT PELVIS WITHOUT CONTRAST TECHNIQUE: Multidetector CT imaging of the pelvis was performed following the standard protocol without intravenous contrast. COMPARISON:  December 10, 2018. FINDINGS: Urinary Tract:  No abnormality visualized. Bowel:  Unremarkable visualized pelvic bowel loops. Vascular/Lymphatic: No pathologically enlarged lymph nodes. No significant vascular abnormality seen. Reproductive: Uterus is unremarkable. No adnexal abnormality is noted. Other:  None. Musculoskeletal: Rounded lucency is noted in right inferior pubic ramus which measures 2 cm in length concerning for lytic lesion. There is no definite evidence of fracture. Also noted is bilobed lucency in the sacrum that measures 2.4 cm concerning for metastatic lesion. IMPRESSION: 2 cm rounded lucency seen in right inferior pubic ramus concerning for lytic lesion or metastatic lesion. Also noted is bilobed lucency in the sacrum measuring 2.4 cm, also concerning for metastatic disease. Bone scan may be performed for further evaluation. Electronically Signed   By: Marijo Conception M.D.   On: 12/11/2018 12:36   Mr Cervical Spine Wo Contrast  Result Date: 12/10/2018 CLINICAL DATA:  Onset severe right neck pain 2-3 days ago. History of metastatic breast cancer. EXAM: MRI CERVICAL SPINE WITHOUT CONTRAST TECHNIQUE: Multiplanar, multisequence MR imaging of the cervical spine was performed. No intravenous  contrast was administered. COMPARISON:  None. FINDINGS: Alignment: Maintained. Vertebrae: There is a lesion in the right side of the C3 vertebral body measuring approximately 1.3 cm AP x 1.3 cm transverse x 1.2 cm craniocaudal highly suspicious for a metastatic deposit. T2 hyperintensity is also seen in the left transverse processes of C7 and T1 worrisome for metastatic disease. No fracture. Cord: Normal signal throughout. Posterior Fossa, vertebral arteries, paraspinal tissues: Negative. Disc levels: C2-3: Mild disc bulge without stenosis. C3-4: The posterior margin of C3 on the right is mildly expanded by tumor deposit but the central canal and foramina appear open. No disc bulge or protrusion. C4-5: Minimal disc bulge without stenosis. C5-6: There is a shallow disc bulge and some uncovertebral disease. No stenosis. C6-7: Negative. C7-T1: Negative. IMPRESSION: Findings most consistent with metastatic disease in the C3 vertebral body and left transverse processes of C7 and T1. The posterior margin of C3 is mildly expanded but no central canal or foraminal stenosis is identified. Postcontrast imaging to evaluate for epidural tumor extension is recommended. Whole-body bone scan could be used to evaluate for additional sites of metastatic disease. These results will be called to the ordering clinician or representative by the Radiologist Assistant, and communication documented in the PACS or zVision Dashboard. Electronically Signed   By: Inge Rise M.D.   On: 12/10/2018 05:38   Mr Cervical Spine W Wo Contrast  Result Date: 12/11/2018 CLINICAL DATA:  Abnormal noncontrast cervical spine study EXAM: MRI CERVICAL SPINE WITHOUT AND WITH CONTRAST TECHNIQUE: Multiplanar and multiecho pulse sequences of the cervical spine, to include the craniocervical junction and cervicothoracic junction, were obtained without and with  intravenous contrast. CONTRAST:  22mL GADAVIST GADOBUTROL 1 MMOL/ML IV SOLN COMPARISON:   Cervical spine MRI December 10, 2018 FINDINGS: Alignment: Stable. Vertebrae: Stable vertebral body heights. Abnormal STIR hyperintensity enhancement is again identified within the C3 vertebral body eccentric to the right. There is mild right ventral epidural extension partially effacing the subarachnoid space. Previously questioned T2 hyperintensity involving the left transverse processes of C7 and T1 cannot be definitively characterized as metastatic disease as this area is not covered on STIR or postcontrast imaging. Therefore, this could reflect fat signal rather than edema. Cord: No abnormal cord signal. Posterior Fossa, vertebral arteries, paraspinal tissues: Unremarkable. Disc levels: No change since 1 day prior. No significant degenerative stenosis. IMPRESSION: Metastatic lesion at C3 with mild right ventral epidural extension partially effacing the subarachnoid space. No cord compression. Previously questioned left C7 and T1 transverse process lesions cannot be definitively characterized metastases as this area incompletely included on this study. Electronically Signed   By: Macy Mis M.D.   On: 12/11/2018 16:12   Dg Hip Unilat With Pelvis 2-3 Views Right  Result Date: 12/10/2018 CLINICAL DATA:  Right hip pain EXAM: DG HIP (WITH OR WITHOUT PELVIS) 2-3V RIGHT COMPARISON:  None. FINDINGS: No fracture or dislocation at the right hip. There is a linear lucency of the right inferior pubic ramus without displacement. The visualized pelvic and left hip osseous structures are normal otherwise. IMPRESSION: Linear lucency of the right inferior pubic ramus may indicate a nondisplaced fracture (CT would be more conclusive). No fracture or dislocation of the right hip. Electronically Signed   By: Ulyses Jarred M.D.   On: 12/10/2018 19:27        This case was discussed with Dr. Lindi Adie. He expresses agreement with my management of this patient.

## 2019-01-02 ENCOUNTER — Encounter (HOSPITAL_COMMUNITY): Payer: Self-pay | Admitting: Emergency Medicine

## 2019-01-02 ENCOUNTER — Other Ambulatory Visit: Payer: Self-pay

## 2019-01-02 ENCOUNTER — Ambulatory Visit: Payer: 59 | Admitting: Radiation Oncology

## 2019-01-02 ENCOUNTER — Ambulatory Visit: Payer: 59

## 2019-01-02 ENCOUNTER — Inpatient Hospital Stay (HOSPITAL_COMMUNITY)
Admission: EM | Admit: 2019-01-02 | Discharge: 2019-01-06 | DRG: 948 | Disposition: A | Discharge status: Home / Self Care | Payer: 59 | Attending: Internal Medicine | Admitting: Internal Medicine

## 2019-01-02 ENCOUNTER — Other Ambulatory Visit: Payer: Self-pay | Admitting: *Deleted

## 2019-01-02 DIAGNOSIS — Z91048 Other nonmedicinal substance allergy status: Secondary | ICD-10-CM

## 2019-01-02 DIAGNOSIS — D539 Nutritional anemia, unspecified: Secondary | ICD-10-CM | POA: Diagnosis present

## 2019-01-02 DIAGNOSIS — I959 Hypotension, unspecified: Secondary | ICD-10-CM | POA: Diagnosis present

## 2019-01-02 DIAGNOSIS — I871 Compression of vein: Secondary | ICD-10-CM | POA: Diagnosis present

## 2019-01-02 DIAGNOSIS — Z79890 Hormone replacement therapy (postmenopausal): Secondary | ICD-10-CM | POA: Diagnosis not present

## 2019-01-02 DIAGNOSIS — Z171 Estrogen receptor negative status [ER-]: Secondary | ICD-10-CM

## 2019-01-02 DIAGNOSIS — Z7901 Long term (current) use of anticoagulants: Secondary | ICD-10-CM | POA: Diagnosis not present

## 2019-01-02 DIAGNOSIS — Y92230 Patient room in hospital as the place of occurrence of the external cause: Secondary | ICD-10-CM | POA: Diagnosis not present

## 2019-01-02 DIAGNOSIS — D630 Anemia in neoplastic disease: Secondary | ICD-10-CM | POA: Diagnosis present

## 2019-01-02 DIAGNOSIS — Z86718 Personal history of other venous thrombosis and embolism: Secondary | ICD-10-CM

## 2019-01-02 DIAGNOSIS — T402X5A Adverse effect of other opioids, initial encounter: Secondary | ICD-10-CM | POA: Diagnosis not present

## 2019-01-02 DIAGNOSIS — Z923 Personal history of irradiation: Secondary | ICD-10-CM | POA: Diagnosis not present

## 2019-01-02 DIAGNOSIS — Z79899 Other long term (current) drug therapy: Secondary | ICD-10-CM

## 2019-01-02 DIAGNOSIS — Z853 Personal history of malignant neoplasm of breast: Secondary | ICD-10-CM | POA: Diagnosis not present

## 2019-01-02 DIAGNOSIS — R52 Pain, unspecified: Secondary | ICD-10-CM | POA: Diagnosis not present

## 2019-01-02 DIAGNOSIS — F329 Major depressive disorder, single episode, unspecified: Secondary | ICD-10-CM | POA: Diagnosis present

## 2019-01-02 DIAGNOSIS — G43909 Migraine, unspecified, not intractable, without status migrainosus: Secondary | ICD-10-CM | POA: Diagnosis present

## 2019-01-02 DIAGNOSIS — C50512 Malignant neoplasm of lower-outer quadrant of left female breast: Secondary | ICD-10-CM

## 2019-01-02 DIAGNOSIS — Z9221 Personal history of antineoplastic chemotherapy: Secondary | ICD-10-CM | POA: Diagnosis not present

## 2019-01-02 DIAGNOSIS — Z20828 Contact with and (suspected) exposure to other viral communicable diseases: Secondary | ICD-10-CM | POA: Diagnosis present

## 2019-01-02 DIAGNOSIS — G893 Neoplasm related pain (acute) (chronic): Secondary | ICD-10-CM | POA: Diagnosis present

## 2019-01-02 DIAGNOSIS — Z9013 Acquired absence of bilateral breasts and nipples: Secondary | ICD-10-CM

## 2019-01-02 DIAGNOSIS — Z803 Family history of malignant neoplasm of breast: Secondary | ICD-10-CM

## 2019-01-02 DIAGNOSIS — Z888 Allergy status to other drugs, medicaments and biological substances status: Secondary | ICD-10-CM

## 2019-01-02 DIAGNOSIS — K5903 Drug induced constipation: Secondary | ICD-10-CM | POA: Diagnosis not present

## 2019-01-02 DIAGNOSIS — E039 Hypothyroidism, unspecified: Secondary | ICD-10-CM | POA: Diagnosis present

## 2019-01-02 DIAGNOSIS — C50919 Malignant neoplasm of unspecified site of unspecified female breast: Secondary | ICD-10-CM

## 2019-01-02 DIAGNOSIS — C7801 Secondary malignant neoplasm of right lung: Secondary | ICD-10-CM | POA: Diagnosis present

## 2019-01-02 DIAGNOSIS — C7951 Secondary malignant neoplasm of bone: Secondary | ICD-10-CM

## 2019-01-02 DIAGNOSIS — Z515 Encounter for palliative care: Secondary | ICD-10-CM

## 2019-01-02 DIAGNOSIS — Z7189 Other specified counseling: Secondary | ICD-10-CM

## 2019-01-02 DIAGNOSIS — D696 Thrombocytopenia, unspecified: Secondary | ICD-10-CM | POA: Diagnosis not present

## 2019-01-02 LAB — CBC WITH DIFFERENTIAL/PLATELET
Abs Immature Granulocytes: 0.51 10*3/uL — ABNORMAL HIGH (ref 0.00–0.07)
Basophils Absolute: 0.1 10*3/uL (ref 0.0–0.1)
Basophils Relative: 1 %
Eosinophils Absolute: 0 10*3/uL (ref 0.0–0.5)
Eosinophils Relative: 0 %
HCT: 31.5 % — ABNORMAL LOW (ref 36.0–46.0)
Hemoglobin: 10.1 g/dL — ABNORMAL LOW (ref 12.0–15.0)
Immature Granulocytes: 6 %
Lymphocytes Relative: 13 %
Lymphs Abs: 1.1 10*3/uL (ref 0.7–4.0)
MCH: 34.8 pg — ABNORMAL HIGH (ref 26.0–34.0)
MCHC: 32.1 g/dL (ref 30.0–36.0)
MCV: 108.6 fL — ABNORMAL HIGH (ref 80.0–100.0)
Monocytes Absolute: 1.2 10*3/uL — ABNORMAL HIGH (ref 0.1–1.0)
Monocytes Relative: 14 %
Neutro Abs: 5.6 10*3/uL (ref 1.7–7.7)
Neutrophils Relative %: 66 %
Platelets: 141 10*3/uL — ABNORMAL LOW (ref 150–400)
RBC: 2.9 MIL/uL — ABNORMAL LOW (ref 3.87–5.11)
RDW: 17.2 % — ABNORMAL HIGH (ref 11.5–15.5)
WBC: 8.5 10*3/uL (ref 4.0–10.5)
nRBC: 3.2 % — ABNORMAL HIGH (ref 0.0–0.2)

## 2019-01-02 LAB — BASIC METABOLIC PANEL
Anion gap: 9 (ref 5–15)
BUN: 17 mg/dL (ref 6–20)
CO2: 28 mmol/L (ref 22–32)
Calcium: 8.9 mg/dL (ref 8.9–10.3)
Chloride: 101 mmol/L (ref 98–111)
Creatinine, Ser: 1 mg/dL (ref 0.44–1.00)
GFR calc Af Amer: >60 mL/min (ref >60)
GFR calc non Af Amer: >60 mL/min (ref >60)
Glucose, Bld: 144 mg/dL — ABNORMAL HIGH (ref 70–99)
Potassium: 3.6 mmol/L (ref 3.5–5.1)
Sodium: 138 mmol/L (ref 135–145)

## 2019-01-02 LAB — SARS CORONAVIRUS 2 (TAT 6-24 HRS): SARS Coronavirus 2: NEGATIVE

## 2019-01-02 MED ORDER — ONDANSETRON HCL 4 MG/2ML IJ SOLN: 4.0000 mg | Freq: Four times a day (QID) | INTRAMUSCULAR | Status: DC | PRN

## 2019-01-02 MED ORDER — LISDEXAMFETAMINE DIMESYLATE 30 MG PO CAPS
30.0000 mg | ORAL_CAPSULE | Freq: Every morning | ORAL | Status: DC
  Administered 2019-01-03 – 2019-01-06 (×4): 30 mg via ORAL
  Filled 2019-01-02 (×5): qty 1

## 2019-01-02 MED ORDER — HYDROMORPHONE HCL 1 MG/ML IJ SOLN
1.0000 mg | INTRAMUSCULAR | Status: DC | PRN
  Filled 2019-01-02: qty 1

## 2019-01-02 MED ORDER — BISACODYL 5 MG PO TBEC
10.0000 mg | DELAYED_RELEASE_TABLET | Freq: Every day | ORAL | Status: DC
  Administered 2019-01-02 – 2019-01-03 (×2): 10 mg via ORAL
  Filled 2019-01-02 (×2): qty 2

## 2019-01-02 MED ORDER — APIXABAN 5 MG PO TABS
5.0000 mg | ORAL_TABLET | Freq: Two times a day (BID) | ORAL | Status: DC
  Administered 2019-01-02 – 2019-01-06 (×9): 5 mg via ORAL
  Filled 2019-01-02 (×10): qty 1

## 2019-01-02 MED ORDER — ENOXAPARIN SODIUM 40 MG/0.4ML ~~LOC~~ SOLN: 40.0000 mg | SUBCUTANEOUS | Status: DC

## 2019-01-02 MED ORDER — DEXAMETHASONE SODIUM PHOSPHATE 4 MG/ML IJ SOLN
4.0000 mg | Freq: Two times a day (BID) | INTRAMUSCULAR | Status: DC
  Administered 2019-01-02 – 2019-01-06 (×8): 4 mg via INTRAVENOUS
  Filled 2019-01-02 (×8): qty 1

## 2019-01-02 MED ORDER — LEVOTHYROXINE SODIUM 88 MCG PO TABS
88.0000 ug | ORAL_TABLET | Freq: Every day | ORAL | Status: DC
  Administered 2019-01-03 – 2019-01-06 (×4): 88 ug via ORAL
  Filled 2019-01-02 (×4): qty 1

## 2019-01-02 MED ORDER — DIAZEPAM 5 MG PO TABS
5.0000 mg | ORAL_TABLET | Freq: Once | ORAL | Status: AC
  Administered 2019-01-02: 5 mg via ORAL
  Filled 2019-01-02: qty 1

## 2019-01-02 MED ORDER — DIPHENHYDRAMINE HCL 50 MG/ML IJ SOLN: 12.5000 mg | Freq: Four times a day (QID) | INTRAMUSCULAR | Status: DC | PRN

## 2019-01-02 MED ORDER — HYDROMORPHONE HCL 2 MG/ML IJ SOLN
2.0000 mg | Freq: Once | INTRAMUSCULAR | Status: AC
  Administered 2019-01-02: 2 mg via INTRAVENOUS
  Filled 2019-01-02: qty 1

## 2019-01-02 MED ORDER — CYCLOBENZAPRINE HCL 5 MG PO TABS
7.5000 mg | ORAL_TABLET | Freq: Three times a day (TID) | ORAL | Status: DC
  Administered 2019-01-02 – 2019-01-06 (×12): 7.5 mg via ORAL
  Filled 2019-01-02: qty 1.5
  Filled 2019-01-02 (×4): qty 2
  Filled 2019-01-02: qty 1.5
  Filled 2019-01-02 (×8): qty 2

## 2019-01-02 MED ORDER — FLUOXETINE HCL 20 MG PO CAPS
40.0000 mg | ORAL_CAPSULE | ORAL | Status: DC
  Administered 2019-01-03 – 2019-01-06 (×4): 40 mg via ORAL
  Filled 2019-01-02 (×5): qty 2

## 2019-01-02 MED ORDER — PROCHLORPERAZINE MALEATE 10 MG PO TABS: 10.0000 mg | ORAL_TABLET | Freq: Four times a day (QID) | ORAL | Status: DC | PRN

## 2019-01-02 MED ORDER — MORPHINE SULFATE ER 30 MG PO TBCR
30.0000 mg | EXTENDED_RELEASE_TABLET | Freq: Two times a day (BID) | ORAL | Status: DC
  Administered 2019-01-02 – 2019-01-04 (×5): 30 mg via ORAL
  Filled 2019-01-02: qty 2
  Filled 2019-01-02 (×4): qty 1

## 2019-01-02 MED ORDER — MORPHINE SULFATE ER 15 MG PO TBCR
15.0000 mg | EXTENDED_RELEASE_TABLET | Freq: Two times a day (BID) | ORAL | Status: DC
  Administered 2019-01-02 – 2019-01-04 (×5): 15 mg via ORAL
  Filled 2019-01-02 (×5): qty 1

## 2019-01-02 MED ORDER — NALOXONE HCL 0.4 MG/ML IJ SOLN: 0.4000 mg | INTRAMUSCULAR | Status: DC | PRN

## 2019-01-02 MED ORDER — HYDROMORPHONE HCL 2 MG/ML IJ SOLN: 2.0000 mg | INTRAMUSCULAR | Status: DC | PRN

## 2019-01-02 MED ORDER — HYDROMORPHONE 1 MG/ML IV SOLN
INTRAVENOUS | Status: DC
  Administered 2019-01-02: 2.5 mg via INTRAVENOUS
  Administered 2019-01-02: 30 mg via INTRAVENOUS
  Administered 2019-01-03: 1.5 mg via INTRAVENOUS
  Administered 2019-01-03: 0 mg via INTRAVENOUS
  Administered 2019-01-03: 2.5 mg via INTRAVENOUS
  Administered 2019-01-03: 6 mg via INTRAVENOUS
  Administered 2019-01-03: 4.5 mg via INTRAVENOUS
  Administered 2019-01-03: 1.5 mg via INTRAVENOUS
  Administered 2019-01-04: 30 mg via INTRAVENOUS
  Administered 2019-01-04 (×2): 1.5 mg via INTRAVENOUS
  Administered 2019-01-04: 0.5 mg via INTRAVENOUS
  Administered 2019-01-04: 1.5 mg via INTRAVENOUS
  Administered 2019-01-04 (×2): 1 mg via INTRAVENOUS
  Administered 2019-01-05: 1.5 mg via INTRAVENOUS
  Administered 2019-01-05: 0.5 mg via INTRAVENOUS
  Administered 2019-01-05: 1 mg via INTRAVENOUS
  Filled 2019-01-02 (×3): qty 30

## 2019-01-02 MED ORDER — HYDROMORPHONE HCL 2 MG/ML IJ SOLN
2.0000 mg | INTRAMUSCULAR | Status: DC | PRN
  Administered 2019-01-02: 2 mg via INTRAVENOUS
  Filled 2019-01-02: qty 1

## 2019-01-02 MED ORDER — DIPHENHYDRAMINE HCL 12.5 MG/5ML PO ELIX: 12.5000 mg | ORAL_SOLUTION | Freq: Four times a day (QID) | ORAL | Status: DC | PRN

## 2019-01-02 MED ORDER — SODIUM CHLORIDE 0.9% FLUSH: 9.0000 mL | INTRAVENOUS | Status: DC | PRN

## 2019-01-02 MED ORDER — GABAPENTIN 300 MG PO CAPS
300.0000 mg | ORAL_CAPSULE | Freq: Every evening | ORAL | Status: DC
  Administered 2019-01-02 – 2019-01-05 (×4): 300 mg via ORAL
  Filled 2019-01-02 (×4): qty 1

## 2019-01-02 MED ORDER — SODIUM CHLORIDE 0.9 % IV SOLN
INTRAVENOUS | Status: DC
  Administered 2019-01-02 – 2019-01-06 (×5): via INTRAVENOUS

## 2019-01-02 MED ORDER — PREDNISONE 5 MG PO TABS: 5.0000 mg | ORAL_TABLET | Freq: Every day | ORAL | Status: DC

## 2019-01-02 MED ORDER — DOCUSATE SODIUM 100 MG PO CAPS
200.0000 mg | ORAL_CAPSULE | Freq: Every day | ORAL | Status: DC
  Administered 2019-01-02 – 2019-01-03 (×2): 200 mg via ORAL
  Filled 2019-01-02 (×2): qty 2

## 2019-01-02 NOTE — H&P (Signed)
History and Physical    Laura Anthony [id number] DOB: 10/05/1975 DOA: 01/02/2019  PCP: [name], MD Patient coming from: Home  Chief Complaint: Back pain  HPI: Laura Anthony is a 43 y.o. female with medical history significant of metastatic breast cancer with bony mets, hyperlipidemia, right shoulder rotator cuff impingement syndrome, provoked right upper extremity DVT on Eliquis admitted with intractable back pain with no response to p.o. narcotics at home.  Patient had oncology office visit yesterday.  She received morphine IV which apparently did not help.  She describes the pain as 10 out of 10 before getting the fentanyl and 7 out of 10 after receiving fentanyl in ER.  She does complain of pain getting worse with deep breathing.  She denies any nausea vomiting diarrhea abdominal pain fever chills cough or chest pain.  She lives at home with her family her husband and 3 children.  She is due to see Dr. Payton Mccallum 01/03/2019.  And she is due to have XRT today at the cancer center.  CT scan abdomen and pelvis 01/01/2019-multiple small bone mets which are progressed since the PET scan 08/02/2018 but unchanged since pelvic CT 12/11/2018.  Healing fracture of the posterolateral aspect of the right eighth rib.  18 x 13 mm mets in the right middle lobe.  9 mm low-density nodule in the left adrenal gland.  Patient admitted to the hospital 12/19/2018 with neck pain at that time she was found to have lytic C3 vertebral body mets.  Neurosurgery was consulted recommended Aspen collar.  MRI of the C-spine showed metastatic disease in C3 vertebral body left transverse process of C7 and T1 with no obvious central stenosis.  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  ED Course: She received fentanyl and Dilaudid in the ER which seems to help some.  Sodium 138 potassium 3.6 BUN 17 creatinine 1.0 white count 8.5 hemoglobin 10.1 platelet count  141.  Review of Systems: As per HPI otherwise all other systems reviewed and are negative  Ambulatory Status: Ambulatory at baseline lives at home with family her husband and 3 children.  Past Medical History:  Diagnosis Date  . ADD (attention deficit disorder)   . Anemia   . Anxiety   . Breast cancer, left breast ([place])    S/P mastectomy 03/27/2017  . DVT (deep venous thrombosis) ([place]) 2017   calf left - probably due to [facility] pills-took eliquis x3 mos, nonthing now  . High cholesterol   . Impingement syndrome of right shoulder 07/2013  . Migraine    "usually 1/month" (03/28/2017)  . PONV (postoperative nausea and vomiting)   . Right bicipital tenosynovitis 07/2013  . Rotator cuff impingement syndrome of right shoulder 07/12/2013  . Seizures ([place])    x 1 as a child - was never on anticonvulsants (03/28/2017)    Past Surgical History:  Procedure Laterality Date  . ADENOIDECTOMY  1981  . ANKLE ARTHROSCOPY Right   . BREAST BIOPSY Left 10/2016  . KNEE ARTHROSCOPY Right   . KNEE ARTHROSCOPY W/ ACL RECONSTRUCTION Left   . LIPOSUCTION WITH LIPOFILLING Bilateral 01/05/2018   Procedure: LIPOFILLING FROM ABDOMEN TO BILATERAL CHEST;  Surgeon: Irene Limbo, MD;  Location: [place];  Service: Plastics;  Laterality: Bilateral;  . MASTECTOMY Left 03/27/2017   NIPPLE SPARING MASTECTOMY WITH RADIOACTIVE SEED TARGETED LYMPH NODE EXCISION AND LEFT AXILLARY SENTINEL LYMPH NODE BIOPSY  . MASTECTOMY Right 03/27/2017   RIGHT PROPHYLACTIC  NIPPLE SPARING MASTECTOMY  . NIPPLE SPARING MASTECTOMY Right 03/27/2017   Procedure: RIGHT PROPHYLACTIC NIPPLE SPARING MASTECTOMY;  Surgeon: Rolm Bookbinder, MD;  Location: [place];  Service: General;  Laterality: Right;  . PORT-A-CATH REMOVAL Right 01/05/2018   Procedure: REMOVAL RIGHT CHEST PORT;  Surgeon: Irene Limbo, MD;  Location: [place];  Service: Plastics;  Laterality: Right;  . PORTACATH PLACEMENT  N/A 11/01/2016   Procedure: INSERTION PORT-A-CATH WITH [place];  Surgeon: Rolm Bookbinder, MD;  Location: [place];  Service: General;  Laterality: N/A;  . PORTACATH PLACEMENT N/A 08/23/2018   Procedure: INSERTION PORT-A-CATH WITH ULTRASOUND;  Surgeon: Rolm Bookbinder, MD;  Location: [place];  Service: General;  Laterality: N/A;  . RADIOACTIVE SEED GUIDED AXILLARY SENTINEL LYMPH NODE Left 03/27/2017   Procedure: LEFT NIPPLE SPARING MASTECTOMY WITH RADIOACTIVE SEED TARGETED LYMPH NODE EXCISION AND LEFT AXILLARY SENTINEL LYMPH NODE BIOPSY;  Surgeon: Rolm Bookbinder, MD;  Location: [place];  Service: General;  Laterality: Left;  REQUESTS RNFA  . RECONSTRUCTION BREAST IMMEDIATE / DELAYED W/ TISSUE EXPANDER Bilateral 03/27/2017   BILATERAL BREAST RECONSTRUCTION WITH PLACEMENT OF TISSUE EXPANDER AND ALLODERM  . REMOVAL OF BILATERAL TISSUE EXPANDERS WITH PLACEMENT OF BILATERAL BREAST IMPLANTS Bilateral 01/05/2018   Procedure: REMOVAL OF BILATERAL TISSUE EXPANDERS WITH PLACEMENT OF BILATERAL BREAST IMPLANTS;  Surgeon: Irene Limbo, MD;  Location: [place];  Service: Plastics;  Laterality: Bilateral;  . SEPTOPLASTY WITH ETHMOIDECTOMY, AND MAXILLARY ANTROSTOMY  10/29/2010   bilat. max. antrostomy with left max. stripping; left ant. ethmoidectomy; right total ethmoidectomy; sphenoidotomy  . SHOULDER ARTHROSCOPY WITH SUBACROMIAL DECOMPRESSION AND BICEP TENDON REPAIR Right 07/12/2013   Procedure: RIGHT SHOULDER ARTHROSCOPY DEBRIDEMENT EXTENSIVE DECOMPRESSION SUBACROMIAL PARTIAL ACROMIOPLASTY;  Surgeon: Johnny Bridge, MD;  Location: [place];  Service: Orthopedics;  Laterality: Right;  . WRIST ARTHROSCOPY  01/17/2012   Procedure: ARTHROSCOPY WRIST; right wrist Surgeon: Tennis Must, MD;  Location: [place];  Service: Orthopedics;  Laterality: Right;  RIGHT WRIST ARTHROSCOPY WITH TRIANGULAR FIBROCARTILAGE COMPLEX REPAIR AND  DEBRIDEMENT     Social History   Socioeconomic History  . Marital status: Married    Spouse name: Not on file  . Number of children: Not on file  . Years of education: Not on file  . Highest education level: Not on file  Occupational History  . Not on file  Social Needs  . Financial resource strain: Not on file  . Food insecurity    Worry: Not on file    Inability: Not on file  . Transportation needs    Medical: Not on file    Non-medical: Not on file  Tobacco Use  . Smoking status: Never Smoker  . Smokeless tobacco: Never Used  Substance and Sexual Activity  . Alcohol use: Yes    Comment: Drinks very rare  . Drug use: No  . Sexual activity: Yes    Birth control/protection: Surgical    Comment: husband has had a vasectomy  Lifestyle  . Physical activity    Days per week: Not on file    Minutes per session: Not on file  . Stress: Not on file  Relationships  . Social Herbalist on phone: Not on file    Gets together: Not on file    Attends religious service: Not on file    Active member of club or organization: Not on file    Attends meetings of clubs or organizations: Not  on file    Relationship status: Not on file  . Intimate partner violence    Fear of current or ex partner: Not on file    Emotionally abused: Not on file    Physically abused: Not on file    Forced sexual activity: Not on file  Other Topics Concern  . Not on file  Social History Narrative  . Not on file    Allergies  Allergen Reactions  . Sumatriptan Other (See Comments)    Numbness to face   . Statins Other (See Comments)    Leg pain  . Tape Rash and Other (See Comments)    Rash from dressing over port-a-cath     Family History  Problem Relation Age of Onset  . Breast cancer Mother 37       triple negative  . Leukemia Father   . Lung cancer Father   . Heart attack Maternal Uncle   . Prostate cancer Paternal Uncle   . COPD Paternal Grandmother   . Heart disease  Paternal Grandfather   . Prostate cancer Paternal Uncle   . Leukemia Cousin      Prior to Admission medications   Medication Sig Start Date End Date Taking? Authorizing Provider  acetaminophen (TYLENOL) 500 MG tablet Take 1,000 mg by mouth every 8 (eight) hours.     [provider]  ELIQUIS 5 MG TABS tablet Take 5 mg by mouth 2 (two) times daily. 11/21/18   [provider]  FLUoxetine (PROZAC) 40 MG capsule Take 40 mg by mouth every morning.     [provider]  gabapentin (NEURONTIN) 300 MG capsule Take 300 mg by mouth every evening.  11/13/18   [provider]  levofloxacin (LEVAQUIN) 750 MG tablet Take by mouth. 12/28/18 01/02/19  [provider]  levothyroxine (SYNTHROID) 88 MCG tablet TAKE 1 TABLET (88 MCG TOTAL) BY MOUTH DAILY BEFORE BREAKFAST. 12/28/18   Nicholas Lose, MD  lidocaine (XYLOCAINE) 2 % solution Use as directed 10 mLs in the mouth or throat every 6 (six) hours as needed for mouth pain. 12/25/18   Kyung Rudd, MD  lidocaine-prilocaine (EMLA) cream APPLY 1 APPLICATION TOPICALLY AS NEEDED. Patient taking differently: Apply 1 application topically as needed (for pain).  12/06/18   Nicholas Lose, MD  lisdexamfetamine (VYVANSE) 30 MG capsule Take 30 mg by mouth every morning.     [provider]  morphine (MS CONTIN) 30 MG 12 hr tablet Take 30 mg by mouth every 12 (twelve) hours.  12/07/18   [provider]  ondansetron (ZOFRAN) 8 MG tablet Take 1 tablet (8 mg total) by mouth 2 (two) times daily as needed (Nausea or vomiting). 12/06/18   Nicholas Lose, MD  Oxycodone HCl 10 MG TABS Take 1 tablet (10 mg total) by mouth every 6 (six) hours as needed. Patient taking differently: Take 10 mg by mouth every 6 (six) hours as needed (for breakthrough pain).  12/06/18   Nicholas Lose, MD  polyethylene glycol (MIRALAX / GLYCOLAX) 17 g packet Take 17 g by mouth daily as needed for mild constipation or moderate constipation. 12/14/18    Mercy Riding, MD  predniSONE (DELTASONE) 10 MG tablet Take 3 tablets (30 mg total) by mouth daily with breakfast for 5 days, THEN 2 tablets (20 mg total) daily with breakfast for 7 days, THEN 1 tablet (10 mg total) daily with breakfast for 7 days, THEN 0.5 tablets (5 mg total) daily with breakfast for 7 days. 12/15/18 01/10/19  Mercy Riding, MD  prochlorperazine (COMPAZINE) 10 MG tablet Take 1 tablet (10 mg total) by mouth every 6 (six) hours as needed (Nausea or vomiting). 12/06/18   Nicholas Lose, MD  rizatriptan (MAXALT) 10 MG tablet Take 10 mg by mouth daily as needed for migraine (and may repeat once in 2 hours, if no relief).     [provider]  senna-docusate (SENOKOT-S) 8.6-50 MG tablet Take 1 tablet by mouth 2 (two) times daily. 12/14/18   Mercy Riding, MD  spironolactone (ALDACTONE) 100 MG tablet Take 100 mg by mouth daily.     [provider]  sucralfate (CARAFATE) 1 g tablet Take 1 tablet by mouth 4 (four) times daily. Crush and mix with 1oz of water and take before meals and at bedtime 12/24/18   [provider]  traZODone (DESYREL) 50 MG tablet Take 1 tablet (50 mg total) by mouth at bedtime as needed for sleep. 12/14/18   Mercy Riding, MD    Physical Exam: Vitals:   01/02/19 1000 01/02/19 1003 01/02/19 1030 01/02/19 1100  BP: 106/78 106/78 105/69 98/75  Pulse: 95 90 86 87  Resp: 14 13 16 18   Temp:      TempSrc:      SpO2: 90% 93% 90% 90%     . General: Appears calm and comfortable . Eyes: PERRL, EOMI, normal lids, iris . ENT: grossly normal hearing, lips & tongue, mmm . Neck: no LAD, masses or thyromegaly . Cardiovascular:  RRR, no m/r/g. No LE edema.  Marland Kitchen Respiratory: CTA bilaterally, no w/r/r. Normal respiratory effort. . Abdomen: soft, ntnd, NABS . Skin:  no rash or induration seen on limited exam . Musculoskeletal: Tender left posterior lower ribs  . psychiatric: grossly normal mood and affect, speech fluent and appropriate, AOx3 .  Neurologic:  CN 2-12 grossly intact, moves all extremities in coordinated fashion, sensation intact  Labs on Admission: I have personally reviewed following labs and imaging studies  CBC: Recent Labs  Lab 01/01/19 1129 01/02/19 0821  WBC 7.4 8.5  NEUTROABS 3.3 5.6  HGB 10.3* 10.1*  HCT 31.9* 31.5*  MCV 106.3* 108.6*  PLT 156 [id number]*   Basic Metabolic Panel: Recent Labs  Lab 01/01/19 1129 01/02/19 0821  NA 141 138  K 3.6 3.6  CL 100 101  CO2 30 28  GLUCOSE 78 144*  BUN 13 17  CREATININE 0.85 1.00  CALCIUM 9.3 8.9   GFR: Estimated Creatinine Clearance: 79.9 mL/min (by C-G formula based on SCr of 1 mg/dL). Liver Function Tests: Recent Labs  Lab 01/01/19 1129  AST 51*  ALT 59*  ALKPHOS 104  BILITOT 0.2*  PROT 7.0  ALBUMIN 3.7   No results for input(s): LIPASE, AMYLASE in the last 168 hours. No results for input(s): AMMONIA in the last 168 hours. Coagulation Profile: No results for input(s): INR, PROTIME in the last 168 hours. Cardiac Enzymes: No results for input(s): CKTOTAL, CKMB, CKMBINDEX, TROPONINI in the last 168 hours. BNP (last 3 results) No results for input(s): PROBNP in the last 8760 hours. HbA1C: No results for input(s): HGBA1C in the last 72 hours. CBG: No results for input(s): GLUCAP in the last 168 hours. Lipid Profile: No results for input(s): CHOL, HDL, LDLCALC, TRIG, CHOLHDL, LDLDIRECT in the last 72 hours. Thyroid Function Tests: No results for input(s): TSH, T4TOTAL, FREET4, T3FREE, THYROIDAB in the last 72 hours. Anemia Panel: No results for input(s): VITAMINB12, FOLATE, FERRITIN, TIBC, IRON, RETICCTPCT in the last 72 hours. Urine  analysis:    Component Value Date/Time   COLORURINE YELLOW 01/01/2019 [place] 01/01/2019 1223   LABSPEC 1.028 01/01/2019 1223   PHURINE 5.0 01/01/2019 1223   GLUCOSEU NEGATIVE 01/01/2019 1223   HGBUR NEGATIVE 01/01/2019 1223   BILIRUBINUR NEGATIVE 01/01/2019 1223   KETONESUR 5 (A)  01/01/2019 1223   PROTEINUR 30 (A) 01/01/2019 1223   NITRITE NEGATIVE 01/01/2019 1223   LEUKOCYTESUR NEGATIVE 01/01/2019 1223    Creatinine Clearance: Estimated Creatinine Clearance: 79.9 mL/min (by C-G formula based on SCr of 1 mg/dL).  Sepsis Labs: @LABRCNTIP (procalcitonin:4,lacticidven:4) )No results found for this or any previous visit (from the past 240 hour(s)).   Radiological Exams on Admission: Ct Abdomen Pelvis W Contrast  Result Date: 01/01/2019 CLINICAL DATA:  Back pain. Metastatic breast cancer. Left flank pain. EXAM: CT ABDOMEN AND PELVIS WITH CONTRAST TECHNIQUE: Multidetector CT imaging of the abdomen and pelvis was performed using the standard protocol following bolus administration of intravenous contrast. CONTRAST:  190mL OMNIPAQUE IOHEXOL 300 MG/ML  SOLN COMPARISON:  CT scan of the pelvis dated 12/11/2018 and PET-CT dated 08/02/2018 and chest CT dated 12/09/2018 no acute abnormalities. FINDINGS: Lower chest: There is a 18 x 13 mm metastasis in right middle lobe. This was visible on the prior study of 12/09/2018 when it measured 19 x 15 mm. Hepatobiliary: No focal liver abnormality is seen. No gallstones, gallbladder wall thickening, or biliary dilatation. Pancreas: Unremarkable. No pancreatic ductal dilatation or surrounding inflammatory changes. Spleen: Normal in size without focal abnormality. Adrenals/Urinary Tract: Stable 9 mm low-density nodule in the left adrenal gland. Right adrenal gland is normal. Kidneys are normal. No hydronephrosis. Bladder is normal. Stomach/Bowel: Stomach is within normal limits. Appendix appears normal. No evidence of bowel wall thickening, distention, or inflammatory changes. Vascular/Lymphatic: No significant vascular findings are present. No enlarged abdominal or pelvic lymph nodes. Reproductive: Uterus and bilateral adnexa are unremarkable. Other: No abdominal wall hernia or abnormality. No abdominopelvic ascites. Musculoskeletal: The patient has  multiple small bone metastases which are progressed since the PET-CT of 08/02/2018. There is a healing fracture of the posterolateral aspect of the right eighth rib. There subtle lesions in the lateral aspect of the T9 vertebral body and in the central portion of the T12 vertebral body. There is a subtle lesion in the left side of the L3 vertebral body as well as a tiny lesion in the left transverse process of L4 and a 24 x 14 mm lesion in the left side of the S1 segment of the sacrum as well as a 10 mm lesion in the left ilium adjacent to the left SI joint. There is a lytic lesion in the right inferior pubic ramus. 9 mm lesion in the right sacral ala. No discrete muscle abnormality. No visible tumor in the spinal canal. IMPRESSION: 1. No acute abnormality of the abdomen or pelvis. 2. No significant change in the metastasis in the right middle lobe. 3. Multiple small bone metastases which are progressed since the PET-CT of 08/02/2018 but essentially unchanged since the pelvic CT of 12/11/2018 and chest CT of 12/09/2018. 4. Healing fracture of the posterolateral aspect of the right eighth rib. Electronically Signed   By: Lorriane Shire M.D.   On: 01/01/2019 15:04    Assessment/Plan Active Problems:   * No active hospital problems. *  #1 intractable pain secondary to metastatic breast cancer with bony mets C3, left transverse process of C7, T1, pelvis, lungs, and meta stasis to the lungs.-currently on chemo and radiation.  Informed Worthy Flank and Dr. Payton Mccallum about patient's admission. Patient has been on oxycodone and MS Contin 30 mg twice a day at home. I will increase her MS Contin to 45 mg twice a day with IV Dilaudid 2 mg every 3 as needed. Palliative care consulted. Add stool softeners  #2 DVT right upper extremity continue Eliquis  #3 hypothyroidism continue Synthroid  #4 depression continue Prozac and Remeron  #5 chronic macrocytic anemia stable  #6 hypotension systolic blood pressure  in the 90s patient appears dry I will give her 1 L of normal saline.  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  No intervention was recommended at that time.  Severity of Illness: The appropriate patient status for this patient is INPATIENT. Inpatient status is judged to be reasonable and necessary in order to provide the required intensity of service to ensure the patient's safety. The patient's presenting symptoms, physical exam findings, and initial radiographic and laboratory data in the context of their chronic comorbidities is felt to place them at high risk for further clinical deterioration. Furthermore, it is not anticipated that the patient will be medically stable for discharge from the hospital within 2 midnights of admission. The following factors support the patient status of inpatient.   " The patient's presenting symptoms include intractable back pain. " The worrisome physical exam findings include tender posterior left lower ribs  " The initial radiographic and laboratory data are worrisome because of multiple mets " The chronic co-morbidities include stage IV metastatic breast cancer   * I certify that at the point of admission it is my clinical judgment that the patient will require inpatient hospital care spanning beyond 2 midnights from the point of admission due to high intensity of service, high risk for further deterioration and high frequency of surveillance required.*   Estimated body mass index is 27.72 kg/m as calculated from the following:   Height as of 12/21/18: 5\' 7"  (1.702 m).   Weight as of 12/21/18: 80.3 kg.   DVT prophylaxis: Eliquis  code Status: Full code discussed with patient Family Communication: Attempted to call husband justin no response Disposition Plan: Pending clinical improvement Consults called: Oncology, radiation oncology Admission status: Inpatient   Georgette Shell MD [facility]  If 7PM-7AM,  please contact night-coverage [email or web address] Password [facility]  01/02/2019, 11:48 AM

## 2019-01-02 NOTE — Progress Notes (Signed)
HEMATOLOGY-ONCOLOGY PROGRESS NOTE  SUBJECTIVE: Intractable back pain requiring hospitalization. She was in the emergency room yesterday and was discharged home after the CT of the abdomen did not reveal any particular etiology.  The pain continued to get worse and she came back into the emergency room and has been admitted.  She is currently on MS Contin 45 mg twice daily and Dilaudid which has been just changed to 2 mg IV every 2 hours as needed.  Patient tells me that she continues to have pain but it is slightly more bearable.  She pinpoints to area in the lower part of the chest and the back with radiating pain along the rib cage.  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, estrogen receptor negative ([place])  10/20/2016 Mammogram   Mammogram and ultrasound of the left breast revealed 1.7 cm mass at 4:00 position, 6:30 position 5 x 4 x 4 mm mass, 6:00 position 5 cm nipple 7 x 6 x 11 mm, left axillary lymph node with thickened cortex, T1c N1 stage II a AJCC 8   10/24/2016 Initial Diagnosis   Left breast biopsy 6:30 position 3 cm from nipple: IDC grade 2, DCIS, ER 0%, PR 0%, Ki-67 15% HER-2 positive ratio 2.1; 4:00 position 3 cm from nipple: IDC grade 2, DCIS, ER 0%, PR 0%, Ki-67 35%, HER-2 positive ratio 2.02; left axillary lymph node biopsy positive   11/04/2016 - 02/17/2017 Neo-Adjuvant Chemotherapy   TCH Perjeta 6 cycles followed by Herceptin + Perjeta maintenance to be completed September 2019   11/30/2016 Genetic Testing   Negative genetic testing on the common hereditary cancer panel.  The Hereditary Gene Panel offered by [facility] includes sequencing and/or deletion duplication testing of the following 47 genes: APC, ATM, AXIN2, BARD1, BMPR1A, BRCA1, BRCA2, BRIP1, CDH1, CDK4, CDKN2A (p14ARF), CDKN2A (p16INK4a), CHEK2, CTNNA1, DICER1, EPCAM (Deletion/duplication testing only), GREM1 (promoter region deletion/duplication testing only), KIT, MEN1, MLH1,  MSH2, MSH3, MSH6, MUTYH, NBN, NF1, NHTL1, PALB2, PDGFRA, PMS2, POLD1, POLE, PTEN, RAD50, RAD51C, RAD51D, SDHB, SDHC, SDHD, SMAD4, SMARCA4. STK11, TP53, TSC1, TSC2, and VHL.  The following genes were evaluated for sequence changes only: SDHA and HOXB13 c.251G>A variant only. The report date is November 30, 2016.    03/27/2017 Surgery   Bilateral mastectomies: Left mastectomy: IDC grade 2 0.9 cm, nodes negative, right mastectomy benign, ER 0%, PR 0%, HER-2 positive ratio 2.6   05/08/2017 - 06/09/2017 Radiation Therapy   Adjuvant radiation therapy   10/23/2017 Miscellaneous   Neratinib discontinued after 4 weeks for severe diarrhea   07/25/2018 Relapse/Recurrence   MRI of right elbow showed bone lesion consistent with malignancy. PET scan showed bilateral pulmonary nodules and several lytic bone lesions compatible with metastatic disease. Brain MRI on 08/02/18 showed no evidence of metastatic disease.   08/02/2018 PET scan   Bilateral hypermetabolic lung nodules, LUL 1.3 cm with SUV 3.88, lingular nodule 1.4 cm SUV 3.7, central lingular nodule 1.2 cm SUV 9.76, right middle lobe nodule 1.5 cm SUV 9.9, lytic bone metastases inferior pubic ramus, sacrum, T12, right 11th rib.   08/08/2018 Procedure   Lung biopsy: metastatic carcinoma, HER-2 negative (0), ER/PR negative.   08/10/2018 -  Radiation Therapy   Palliative radiatio to the right humerus along the medial condyle   08/24/2018 - 09/05/2018 Radiation Therapy   Palliative radiation to the right 11th rib and right elbow   09/26/2018 - 12/04/2018 Chemotherapy   Carboplatin atezolizumab at [facility] with Dr. 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    12/17/2018 -  Radiation Therapy   Palliative radiation to sternal, sacral & pelvic lesions and SRS for T3 and C7-T1 lesions.   Bone metastases ([place])  08/07/2018 Initial Diagnosis   Bone metastases ([place])   12/14/2018 -  Chemotherapy   The patient had pertuzumab (PERJETA) 420 mg in sodium chloride 0.9 % 250 mL chemo infusion, 420 mg (100 % of original dose 420 mg), Intravenous, Once, 1 of 6 cycles Dose modification: 420 mg (original dose 420 mg, Cycle 1, Reason: Provider Judgment) Administration: 420 mg (12/14/2018) trastuzumab-dkst (OGIVRI) 600 mg in sodium chloride 0.9 % 250 mL chemo infusion, 609 mg, Intravenous,  Once, 1 of 6 cycles Administration: 600 mg (12/14/2018)  for chemotherapy treatment.    12/17/2018 -  Radiation Therapy   Palliative radiation to sternal, sacral & pelvic lesions and SRS for T3 and C7-T1 lesions.     OBJECTIVE: REVIEW OF SYSTEMS:   Severe intractable pain as above   PHYSICAL EXAMINATION: ECOG PERFORMANCE STATUS: 3 - Symptomatic, >50% confined to bed  Vitals:   01/02/19 1521 01/02/19 1557  BP: 111/72 116/88  Pulse: 94 92  Resp: 15 18  Temp:  98.9 F (37.2 C)  SpO2: 91% 93%   There were no vitals filed for this visit. Awake alert and oriented x3 Lungs clear Heart S1-S2 normal Abdomen nontender nondistended Neuro: Able to move her extremities.  LABORATORY DATA:  I have reviewed the data as listed CMP Latest Ref Rng & Units 01/02/2019 01/01/2019 12/21/2018  Glucose 70 - 99 mg/dL 144(H) 78 110(H)  BUN 6 - 20 mg/dL 17 13 18   Creatinine 0.44 - 1.00 mg/dL 1.00 0.85 0.82  Sodium 135 - 145 mmol/L 138 141 139  Potassium 3.5 - 5.1 mmol/L 3.6 3.6 3.3(L)  Chloride 98 - 111 mmol/L 101 100 99  CO2 22 - 32 mmol/L 28 30 29   Calcium 8.9 - 10.3 mg/dL 8.9 9.3 8.7(L)  Total Protein 6.5 - 8.1 g/dL - 7.0 7.0  Total Bilirubin 0.3 - 1.2  mg/dL - 0.2(L) 0.3  Alkaline Phos 38 - 126 U/L - 104 105  AST 15 - 41 U/L - 51(H) 29  ALT 0 - 44 U/L - 59(H) 50(H)    Lab Results  Component Value Date   WBC 8.5 01/02/2019   HGB 10.1 (L) 01/02/2019   HCT 31.5 (L) 01/02/2019   MCV 108.6 (H) 01/02/2019   PLT 141 (L) 01/02/2019   NEUTROABS 5.6 01/02/2019    ASSESSMENT AND PLAN: 1.  Severe and intractable pain: I suspect it is coming from the thoracic vertebrae. We will plan to get an MRI of the thoracic spine. Depending on the MRI report, further radiation may need to be considered. Palliative care has been consulted. 2.  Metastatic breast cancer: On [place] Herceptin chemotherapy. Recent hospitalization for neutropenic fever requiring growth factor injections 3.  Pain regimen: We will await palliative care to adjust her medication.  I change the Dilaudid to every 2 hours as needed for the time being.

## 2019-01-02 NOTE — Progress Notes (Signed)
Per MD, referral placed for palliative care and intake called to [facility].

## 2019-01-02 NOTE — Progress Notes (Signed)
  Radiation Oncology         [phone]) [phone] ________________________________  Name: Laura Anthony MRN: [id number]  Date: 12/12/2018  DOB: 08-04-1975  SIMULATION AND TREATMENT PLANNING NOTE  DIAGNOSIS:     ICD-10-CM   1. Bone metastases ([place])  C79.51     Site:   1.  Spine at the C3 level 2.  Spine at the T1 level  NARRATIVE:  The patient was brought to the [facility].  Identity was confirmed.  All relevant records and images related to the planned course of therapy were reviewed.   Written consent to proceed with treatment was confirmed which was freely given after reviewing the details related to the planned course of therapy had been reviewed with the patient.  Then, the patient was set-up in a stable reproducible supine position for radiation therapy.  CT images were obtained.  Surface markings were placed.    Medically necessary complex treatment device(s) for immobilization:  1.  customized body-fix device, 2.  customized accuform device.   The CT images were loaded into the planning software.  Then the target and avoidance structures were contoured.  Treatment planning then occurred.  The radiation prescription was entered and confirmed.   The patient will receive a course of stereotactic radiosurgery to the spine. I am therefore requesting a 3-D conformal technique. The target volume has been contoured in addition to the spinal cord and surrounding critical at risk organs.  Dose volume histograms of each of these structures will be carefully reviewed as part of the 3-D conformal technique.   PLAN:  The patient will receive 18 Gy in 1 fraction to the 2 separate target regions above  ________________________________   Jodelle Gross, MD, PhD

## 2019-01-02 NOTE — Progress Notes (Signed)
  Radiation Oncology         [phone]) [phone] ________________________________  Name: Laura Anthony MRN: [id number]  Date: 12/12/2018  DOB: 02/29/1975  SIMULATION AND TREATMENT PLANNING NOTE  DIAGNOSIS:     ICD-10-CM   1. Bone metastases ([place])  C79.51      Site:   1.  Sternum 2.  Sacrum 3.  Right inferior pelvis  NARRATIVE:  The patient was brought to the [facility].  Identity was confirmed.  All relevant records and images related to the planned course of therapy were reviewed.   Written consent to proceed with treatment was confirmed which was freely given after reviewing the details related to the planned course of therapy had been reviewed with the patient.  Then, the patient was set-up in a stable reproducible  supine position for radiation therapy.  CT images were obtained.  Surface markings were placed.    Medically necessary complex treatment device(s) for immobilization:  Vac-lock bag.   The CT images were loaded into the planning software.  Then the target and avoidance structures were contoured.  Treatment planning then occurred.  The radiation prescription was entered and confirmed.  A total of 8 complex treatment devices were fabricated which relate to the designed radiation treatment fields. Each of these customized fields/ complex treatment devices will be used on a daily basis during the radiation course. I have requested : Isodose Plan.   PLAN:  The patient will receive 37.5 Gy in 15 fractions.  ________________________________   Jodelle Gross, MD, PhD

## 2019-01-02 NOTE — ED Triage Notes (Signed)
Pt reports has breast cancer and was here yesterday for back pains. CT scan showed more lesions. Reports home medications arent helping with the pain.

## 2019-01-02 NOTE — Progress Notes (Signed)
I spoke with the patient. She is still in the ED awaiting bed placement for admission, and still in pain. She requests postponing simulation until tomorrow. We will still try to get her down to our department for treatment around 4pm today.     Carola Rhine, PAC

## 2019-01-02 NOTE — ED Provider Notes (Signed)
[name] Provider Note   CSN: [id number] Arrival date & time: 01/02/19  0745     History   Chief Complaint Chief Complaint  Patient presents with  . Back Pain  . Flank Pain    HPI Laura Anthony is a 43 y.o. female.     HPI Patient presents to the emergency department with worsening back pain from her metastatic disease.  Patient was seen here in the emergency department yesterday and assessed.  She had fairly good pain control and was discharged home.  The patient states she woke in the night with worsening pain.  Patient states that certain movements.  Past Medical History:  Diagnosis Date  . ADD (attention deficit disorder)   . Anemia   . Anxiety   . Breast cancer, left breast ([place])    S/P mastectomy 03/27/2017  . DVT (deep venous thrombosis) ([place]) 2017   calf left - probably due to [facility] pills-took eliquis x3 mos, nonthing now  . High cholesterol   . Impingement syndrome of right shoulder 07/2013  . Migraine    "usually 1/month" (03/28/2017)  . PONV (postoperative nausea and vomiting)   . Right bicipital tenosynovitis 07/2013  . Rotator cuff impingement syndrome of right shoulder 07/12/2013  . Seizures ([place])    x 1 as a child - was never on anticonvulsants (03/28/2017)    Patient Active Problem List   Diagnosis Date Noted  . 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 Metastatic breast cancer ([place]) 12/10/2018  . Neck pain 12/09/2018  . Hypothyroidism 12/09/2018  . Goals of care, counseling/discussion 12/06/2018  . Bone metastases ([place]) 08/07/2018  . Pain from bone metastases ([place]) 08/07/2018  . Family history of breast cancer 04/02/2018  . Family history of prostate cancer 04/02/2018  . History of therapeutic radiation 10/16/2017  . Acquired absence of both breasts 04/03/2017  . Breast cancer, stage 2, left ([place]) 03/27/2017  . Port-A-Cath in  place 12/16/2016  . Genetic testing 12/06/2016  . Encounter for antineoplastic chemotherapy 11/25/2016  . Malignant neoplasm of lower-outer quadrant of left breast of female, estrogen receptor negative ([place]) 10/27/2016  . Breast lump on left side at 1 o'clock position 05/22/2015  . Migraine without aura or status migrainosus 05/29/2014  . Hyperlipidemia LDL goal <100 10/22/2013  . Depression 09/09/2013  . Rotator cuff impingement syndrome of left shoulder 07/12/2013  . ADD (attention deficit disorder) 01/09/2013  . Anxiety 12/13/2012  . Insomnia 12/13/2012    Past Surgical History:  Procedure Laterality Date  . ADENOIDECTOMY  1981  . ANKLE ARTHROSCOPY Right   . BREAST BIOPSY Left 10/2016  . KNEE ARTHROSCOPY Right   . KNEE ARTHROSCOPY W/ ACL RECONSTRUCTION Left   . LIPOSUCTION WITH LIPOFILLING Bilateral 01/05/2018   Procedure: LIPOFILLING FROM ABDOMEN TO BILATERAL CHEST;  Surgeon: Irene Limbo, MD;  Location: [facility];  Service: Plastics;  Laterality: Bilateral;  . MASTECTOMY Left 03/27/2017   NIPPLE SPARING MASTECTOMY WITH RADIOACTIVE SEED TARGETED LYMPH NODE EXCISION AND LEFT AXILLARY SENTINEL LYMPH NODE BIOPSY  . MASTECTOMY Right 03/27/2017   RIGHT PROPHYLACTIC NIPPLE SPARING MASTECTOMY  . NIPPLE SPARING MASTECTOMY Right 03/27/2017   Procedure: RIGHT PROPHYLACTIC NIPPLE SPARING MASTECTOMY;  Surgeon: Rolm Bookbinder, MD;  Location: [facility];  Service: General;  Laterality: Right;  . PORT-A-CATH REMOVAL Right 01/05/2018   Procedure: REMOVAL RIGHT CHEST PORT;  Surgeon: Irene Limbo, MD;  Location:  [facility];  Service: Plastics;  Laterality: Right;  . PORTACATH PLACEMENT N/A 11/01/2016   Procedure: INSERTION PORT-A-CATH WITH [place];  Surgeon: Rolm Bookbinder, MD;  Location: [facility];  Service: General;  Laterality: N/A;  . PORTACATH PLACEMENT N/A 08/23/2018   Procedure: INSERTION PORT-A-CATH WITH ULTRASOUND;  Surgeon: Rolm Bookbinder, MD;  Location: [facility];  Service: General;  Laterality: N/A;  . RADIOACTIVE SEED GUIDED AXILLARY SENTINEL LYMPH NODE Left 03/27/2017   Procedure: LEFT NIPPLE SPARING MASTECTOMY WITH RADIOACTIVE SEED TARGETED LYMPH NODE EXCISION AND LEFT AXILLARY SENTINEL LYMPH NODE BIOPSY;  Surgeon: Rolm Bookbinder, MD;  Location: [facility];  Service: General;  Laterality: Left;  REQUESTS RNFA  . RECONSTRUCTION BREAST IMMEDIATE / DELAYED W/ TISSUE EXPANDER Bilateral 03/27/2017   BILATERAL BREAST RECONSTRUCTION WITH PLACEMENT OF TISSUE EXPANDER AND ALLODERM  . REMOVAL OF BILATERAL TISSUE EXPANDERS WITH PLACEMENT OF BILATERAL BREAST IMPLANTS Bilateral 01/05/2018   Procedure: REMOVAL OF BILATERAL TISSUE EXPANDERS WITH PLACEMENT OF BILATERAL BREAST IMPLANTS;  Surgeon: Irene Limbo, MD;  Location: [facility] [place];  Service: Plastics;  Laterality: Bilateral;  . SEPTOPLASTY WITH ETHMOIDECTOMY, AND MAXILLARY ANTROSTOMY  10/29/2010   bilat. max. antrostomy with left max. stripping; left ant. ethmoidectomy; right total ethmoidectomy; sphenoidotomy  . SHOULDER ARTHROSCOPY WITH SUBACROMIAL DECOMPRESSION AND BICEP TENDON REPAIR Right 07/12/2013   Procedure: RIGHT SHOULDER ARTHROSCOPY DEBRIDEMENT EXTENSIVE DECOMPRESSION SUBACROMIAL PARTIAL ACROMIOPLASTY;  Surgeon: Johnny Bridge, MD;  Location: [facility];  Service: Orthopedics;  Laterality: Right;  . WRIST ARTHROSCOPY  01/17/2012   Procedure: ARTHROSCOPY WRIST; right wrist Surgeon: Tennis Must, MD;  Location: [facility];  Service: Orthopedics;  Laterality: Right;  RIGHT WRIST ARTHROSCOPY WITH TRIANGULAR FIBROCARTILAGE COMPLEX REPAIR AND DEBRIDEMENT      OB History   No obstetric history on file.      Home Medications    Prior to Admission medications   Medication Sig Start Date End Date Taking? Authorizing Provider  acetaminophen (TYLENOL) 500 MG tablet Take 1,000 mg by mouth every  8 (eight) hours.     [provider]  ELIQUIS 5 MG TABS tablet Take 5 mg by mouth 2 (two) times daily. 11/21/18   [provider]  FLUoxetine (PROZAC) 40 MG capsule Take 40 mg by mouth every morning.     [provider]  gabapentin (NEURONTIN) 300 MG capsule Take 300 mg by mouth every evening.  11/13/18   [provider]  levofloxacin (LEVAQUIN) 750 MG tablet Take by mouth. 12/28/18 01/02/19  [provider]  levothyroxine (SYNTHROID) 88 MCG tablet TAKE 1 TABLET (88 MCG TOTAL) BY MOUTH DAILY BEFORE BREAKFAST. 12/28/18   Nicholas Lose, MD  lidocaine (XYLOCAINE) 2 % solution Use as directed 10 mLs in the mouth or throat every 6 (six) hours as needed for mouth pain. 12/25/18   Kyung Rudd, MD  lidocaine-prilocaine (EMLA) cream APPLY 1 APPLICATION TOPICALLY AS NEEDED. Patient taking differently: Apply 1 application topically as needed (for pain).  12/06/18   Nicholas Lose, MD  lisdexamfetamine (VYVANSE) 30 MG capsule Take 30 mg by mouth every morning.     [provider]  morphine (MS CONTIN) 30 MG 12 hr tablet Take 30 mg by mouth every 12 (twelve) hours.  12/07/18   [provider]  ondansetron (ZOFRAN) 8 MG tablet Take 1 tablet (8 mg total) by mouth 2 (two) times daily as needed (Nausea or vomiting). 12/06/18   Nicholas Lose, MD  Oxycodone HCl 10 MG  TABS Take 1 tablet (10 mg total) by mouth every 6 (six) hours as needed. Patient taking differently: Take 10 mg by mouth every 6 (six) hours as needed (for breakthrough pain).  12/06/18   Nicholas Lose, MD  polyethylene glycol (MIRALAX / GLYCOLAX) 17 g packet Take 17 g by mouth daily as needed for mild constipation or moderate constipation. 12/14/18   Mercy Riding, MD  predniSONE (DELTASONE) 10 MG tablet Take 3 tablets (30 mg total) by mouth daily with breakfast for 5 days, THEN 2 tablets (20 mg total) daily with breakfast for 7 days, THEN 1 tablet (10 mg total) daily with breakfast for 7 days, THEN  0.5 tablets (5 mg total) daily with breakfast for 7 days. 12/15/18 01/10/19  Mercy Riding, MD  prochlorperazine (COMPAZINE) 10 MG tablet Take 1 tablet (10 mg total) by mouth every 6 (six) hours as needed (Nausea or vomiting). 12/06/18   Nicholas Lose, MD  rizatriptan (MAXALT) 10 MG tablet Take 10 mg by mouth daily as needed for migraine (and may repeat once in 2 hours, if no relief).     [provider]  senna-docusate (SENOKOT-S) 8.6-50 MG tablet Take 1 tablet by mouth 2 (two) times daily. 12/14/18   Mercy Riding, MD  spironolactone (ALDACTONE) 100 MG tablet Take 100 mg by mouth daily.     [provider]  traZODone (DESYREL) 50 MG tablet Take 1 tablet (50 mg total) by mouth at bedtime as needed for sleep. 12/14/18   Mercy Riding, MD    Family History Family History  Problem Relation Age of Onset  . Breast cancer Mother 66       triple negative  . Leukemia Father   . Lung cancer Father   . Heart attack Maternal Uncle   . Prostate cancer Paternal Uncle   . COPD Paternal Grandmother   . Heart disease Paternal Grandfather   . Prostate cancer Paternal Uncle   . Leukemia Cousin     Social History Social History   Tobacco Use  . Smoking status: Never Smoker  . Smokeless tobacco: Never Used  Substance Use Topics  . Alcohol use: Yes    Comment: Drinks very rare  . Drug use: No     Allergies   Sumatriptan, Statins, and Tape   Review of Systems Review of Systems All other systems negative except as documented in the HPI. All pertinent positives and negatives as reviewed in the HPI.  Physical Exam Updated Vital Signs BP (!) 137/92 (BP Location: Right Arm) Comment: Simultaneous filing. User may not have seen previous data.  Pulse (!) 106 Comment: Simultaneous filing. User may not have seen previous data.  Temp 99.1 F (37.3 C) (Oral)   Resp 20 Comment: Simultaneous filing. User may not have seen previous data.  SpO2 97% Comment: Simultaneous filing. User  may not have seen previous data.  Physical Exam Vitals signs and nursing note reviewed.  Constitutional:      General: She is not in acute distress.    Appearance: She is well-developed.  HENT:     Head: Normocephalic and atraumatic.  Eyes:     Pupils: Pupils are equal, round, and reactive to light.  Neck:     Musculoskeletal: Normal range of motion and neck supple.  Cardiovascular:     Rate and Rhythm: Normal rate and regular rhythm.     Heart sounds: Normal heart sounds. No murmur. No friction rub. No gallop.   Pulmonary:  Effort: Pulmonary effort is normal. No respiratory distress.     Breath sounds: Normal breath sounds. No wheezing.  Abdominal:     General: Bowel sounds are normal. There is no distension.     Palpations: Abdomen is soft.     Tenderness: There is no abdominal tenderness.  Skin:    General: Skin is warm and dry.     Capillary Refill: Capillary refill takes less than 2 seconds.     Findings: No erythema or rash.  Neurological:     Mental Status: She is alert and oriented to person, place, and time.     Motor: No abnormal muscle tone.     Coordination: Coordination normal.  Psychiatric:        Behavior: Behavior normal.      ED Treatments / Results  Labs (all labs ordered are listed, but only abnormal results are displayed) Labs Reviewed  BASIC METABOLIC PANEL  CBC WITH DIFFERENTIAL/PLATELET    EKG None  Radiology Ct Abdomen Pelvis W Contrast  Result Date: 01/01/2019 CLINICAL DATA:  Back pain. Metastatic breast cancer. Left flank pain. EXAM: CT ABDOMEN AND PELVIS WITH CONTRAST TECHNIQUE: Multidetector CT imaging of the abdomen and pelvis was performed using the standard protocol following bolus administration of intravenous contrast. CONTRAST:  150mL OMNIPAQUE IOHEXOL 300 MG/ML  SOLN COMPARISON:  CT scan of the pelvis dated 12/11/2018 and PET-CT dated 08/02/2018 and chest CT dated 12/09/2018 no acute abnormalities. FINDINGS: Lower chest: There  is a 18 x 13 mm metastasis in right middle lobe. This was visible on the prior study of 12/09/2018 when it measured 19 x 15 mm. Hepatobiliary: No focal liver abnormality is seen. No gallstones, gallbladder wall thickening, or biliary dilatation. Pancreas: Unremarkable. No pancreatic ductal dilatation or surrounding inflammatory changes. Spleen: Normal in size without focal abnormality. Adrenals/Urinary Tract: Stable 9 mm low-density nodule in the left adrenal gland. Right adrenal gland is normal. Kidneys are normal. No hydronephrosis. Bladder is normal. Stomach/Bowel: Stomach is within normal limits. Appendix appears normal. No evidence of bowel wall thickening, distention, or inflammatory changes. Vascular/Lymphatic: No significant vascular findings are present. No enlarged abdominal or pelvic lymph nodes. Reproductive: Uterus and bilateral adnexa are unremarkable. Other: No abdominal wall hernia or abnormality. No abdominopelvic ascites. Musculoskeletal: The patient has multiple small bone metastases which are progressed since the PET-CT of 08/02/2018. There is a healing fracture of the posterolateral aspect of the right eighth rib. There subtle lesions in the lateral aspect of the T9 vertebral body and in the central portion of the T12 vertebral body. There is a subtle lesion in the left side of the L3 vertebral body as well as a tiny lesion in the left transverse process of L4 and a 24 x 14 mm lesion in the left side of the S1 segment of the sacrum as well as a 10 mm lesion in the left ilium adjacent to the left SI joint. There is a lytic lesion in the right inferior pubic ramus. 9 mm lesion in the right sacral ala. No discrete muscle abnormality. No visible tumor in the spinal canal. IMPRESSION: 1. No acute abnormality of the abdomen or pelvis. 2. No significant change in the metastasis in the right middle lobe. 3. Multiple small bone metastases which are progressed since the PET-CT of 08/02/2018 but  essentially unchanged since the pelvic CT of 12/11/2018 and chest CT of 12/09/2018. 4. Healing fracture of the posterolateral aspect of the right eighth rib. Electronically Signed   By: Jeneen Rinks  Maxwell M.D.   On: 01/01/2019 15:04    Procedures Procedures (including critical care time)  Medications Ordered in ED Medications  diazepam (VALIUM) tablet 5 mg (has no administration in time range)  HYDROmorphone (DILAUDID) injection 2 mg (2 mg Intravenous Given 01/02/19 0828)     Initial Impression / Assessment and Plan / ED Course  I have reviewed the triage vital signs and the nursing notes.  Pertinent labs & imaging results that were available during my care of the patient were reviewed by me and considered in my medical decision making (see chart for details).        The patient I feel need admission for further pain control due to the fact that she is now had 2 visits in the last 2 days.  She is due to see her oncology team today for several things.  Patient is advised of the plan and all questions were answered.  I spoke with the [facility]  Final Clinical Impressions(s) / ED Diagnoses   Final diagnoses:  None    ED Discharge Orders    None       Dalia Heading, PA-C 01/02/19 1503    Lucrezia Starch, MD 01/04/19 [phone]

## 2019-01-02 NOTE — Consult Note (Signed)
Consultation Note Date: 01/02/2019   Patient Name: Laura Anthony  DOB: 1975/03/31  MRN: [id number]  Age / Sex: 43 y.o., female  PCP: [name], MD Referring Physician: Georgette Shell, MD  Reason for Consultation: Pain control  HPI/Patient Profile: 43 y.o. female  with past medical history of metastatic breast cancer admitted on 01/02/2019 with intractable back pain.  Palliative consulted for pain management.   I met today with [name].  We discussed her increased pain.  Reports pain is sharp 10/10 and has been slightly improved with current pain medications.  We reviewed her home pain regimen and discussed options for pain management overnight.    SUMMARY OF RECOMMENDATIONS   - Pain: bone pain with somatic as well as neuropathic components.  Plan for addition of steroids as well as PCA overnight.   - Reassess her overnight opioid needs in AM.  Code Status/Advance Care Planning:  Full code   Symptom Management:   Continue MS Contin.  Plan for hydromorphone PCA overnight.  Will reassess tomorrow once better determine her needs to obtain better pain control.  Palliative Prophylaxis:   Frequent Pain Assessment  Additional Recommendations (Limitations, Scope, Preferences):  Full Scope Treatment  Psycho-social/Spiritual:   Desire for further Chaplaincy support:Did not address today  Additional Recommendations: Caregiving  Support/Resources  Prognosis:   Unable to determine  Discharge Planning: To Be Determined      Primary Diagnoses: Present on Admission: . Inadequate pain control   I have reviewed the medical record, interviewed the patient and family, and examined the patient. The following aspects are pertinent.  Past Medical History:  Diagnosis Date  . ADD (attention deficit disorder)   . Anemia   . Anxiety   . Breast cancer, left breast ([place])    S/P mastectomy 03/27/2017  . DVT (deep venous thrombosis) ([place]) 2017   calf left - probably due to [facility] pills-took eliquis x3 mos, nonthing now  . High cholesterol   . Impingement syndrome of right shoulder 07/2013  . Migraine    "usually 1/month" (03/28/2017)  . PONV (postoperative nausea and vomiting)   . Right bicipital tenosynovitis 07/2013  . Rotator cuff impingement syndrome of right shoulder 07/12/2013  . Seizures ([place])    x 1 as a child - was never on anticonvulsants (03/28/2017)   Social History   Socioeconomic History  . Marital status: Married    Spouse name: Not on file  . Number of children: Not on file  . Years of education: Not on file  . Highest education level: Not on file  Occupational History  . Not on file  Social Needs  . Financial resource strain: Not on file  . Food insecurity    Worry: Not on file    Inability: Not on file  . Transportation needs    Medical: Not on file    Non-medical: Not on file  Tobacco Use  . Smoking status: Never Smoker  . Smokeless tobacco: Never Used  Substance and Sexual Activity  .  Alcohol use: Yes    Comment: Drinks very rare  . Drug use: No  . Sexual activity: Yes    Birth control/protection: Surgical    Comment: husband has had a vasectomy  Lifestyle  . Physical activity    Days per week: Not on file    Minutes per session: Not on file  . Stress: Not on file  Relationships  . Social Herbalist on phone: Not on file    Gets together: Not on file    Attends religious service: Not on file    Active member of club or organization: Not on file    Attends meetings of clubs or organizations: Not on file    Relationship status: Not on file  Other Topics Concern  . Not on file  Social History Narrative  . Not on file   Family History  Problem Relation Age of Onset  . Breast cancer Mother 76       triple negative  . Leukemia Father   . Lung cancer Father   . Heart attack Maternal Uncle   . Prostate cancer  Paternal Uncle   . COPD Paternal Grandmother   . Heart disease Paternal Grandfather   . Prostate cancer Paternal Uncle   . Leukemia Cousin    Scheduled Meds: . apixaban  5 mg Oral BID  . bisacodyl  10 mg Oral Daily  . cyclobenzaprine  7.5 mg Oral TID  . dexamethasone (DECADRON) injection  4 mg Intravenous Q12H  . docusate sodium  200 mg Oral Daily  . [START ON 01/03/2019] FLUoxetine  40 mg Oral BH-q7a  . gabapentin  300 mg Oral QPM  . HYDROmorphone   Intravenous Q4H  . [START ON 01/03/2019] levothyroxine  88 mcg Oral Q0600  . [START ON 01/03/2019] lisdexamfetamine  30 mg Oral q morning - 10a  . morphine  15 mg Oral Q12H  . morphine  30 mg Oral Q12H   Continuous Infusions: . sodium chloride 100 mL/hr at 01/02/19 1507   PRN Meds:.diphenhydrAMINE **OR** diphenhydrAMINE, HYDROmorphone (DILAUDID) injection, naloxone **AND** sodium chloride flush, ondansetron (ZOFRAN) IV, prochlorperazine Medications Prior to Admission:  Prior to Admission medications   Medication Sig Start Date End Date Taking? Authorizing Provider  ELIQUIS 5 MG TABS tablet Take 5 mg by mouth 2 (two) times daily. 11/21/18  Yes [provider]  FLUoxetine (PROZAC) 40 MG capsule Take 40 mg by mouth every morning.    Yes [provider]  gabapentin (NEURONTIN) 300 MG capsule Take 300 mg by mouth every evening.  11/13/18  Yes [provider]  levofloxacin (LEVAQUIN) 750 MG tablet Take 750 mg by mouth daily.  12/28/18 01/02/19 Yes [provider]  levothyroxine (SYNTHROID) 88 MCG tablet TAKE 1 TABLET (88 MCG TOTAL) BY MOUTH DAILY BEFORE BREAKFAST. 12/28/18  Yes Nicholas Lose, MD  lidocaine (XYLOCAINE) 2 % solution Use as directed 10 mLs in the mouth or throat every 6 (six) hours as needed for mouth pain. 12/25/18  Yes Kyung Rudd, MD  lidocaine-prilocaine (EMLA) cream APPLY 1 APPLICATION TOPICALLY AS NEEDED. Patient taking differently: Apply 1 application topically as needed (for pain).   12/06/18  Yes Nicholas Lose, MD  lisdexamfetamine (VYVANSE) 30 MG capsule Take 30 mg by mouth every morning.    Yes [provider]  morphine (MS CONTIN) 30 MG 12 hr tablet Take 30 mg by mouth every 12 (twelve) hours.  12/07/18  Yes [provider]  ondansetron (ZOFRAN) 8 MG tablet Take  1 tablet (8 mg total) by mouth 2 (two) times daily as needed (Nausea or vomiting). 12/06/18  Yes Nicholas Lose, MD  Oxycodone HCl 10 MG TABS Take 1 tablet (10 mg total) by mouth every 6 (six) hours as needed. Patient taking differently: Take 10 mg by mouth every 6 (six) hours as needed (for breakthrough pain).  12/06/18  Yes Nicholas Lose, MD  polyethylene glycol (MIRALAX / GLYCOLAX) 17 g packet Take 17 g by mouth daily as needed for mild constipation or moderate constipation. 12/14/18  Yes Mercy Riding, MD  predniSONE (DELTASONE) 10 MG tablet Take 3 tablets (30 mg total) by mouth daily with breakfast for 5 days, THEN 2 tablets (20 mg total) daily with breakfast for 7 days, THEN 1 tablet (10 mg total) daily with breakfast for 7 days, THEN 0.5 tablets (5 mg total) daily with breakfast for 7 days. 12/15/18 01/10/19 Yes Mercy Riding, MD  prochlorperazine (COMPAZINE) 10 MG tablet Take 1 tablet (10 mg total) by mouth every 6 (six) hours as needed (Nausea or vomiting). 12/06/18  Yes Nicholas Lose, MD  rizatriptan (MAXALT) 10 MG tablet Take 10 mg by mouth daily as needed for migraine (and may repeat once in 2 hours, if no relief).    Yes [provider]  senna-docusate (SENOKOT-S) 8.6-50 MG tablet Take 1 tablet by mouth 2 (two) times daily. Patient taking differently: Take 1 tablet by mouth 2 (two) times daily as needed for moderate constipation.  12/14/18  Yes Mercy Riding, MD  spironolactone (ALDACTONE) 100 MG tablet Take 100 mg by mouth daily.    Yes [provider]  sucralfate (CARAFATE) 1 g tablet Take 1 tablet by mouth 4 (four) times daily as needed (for indigestion). Crush and mix with  1oz of water and take before meals and at bedtime 12/24/18  Yes [provider]  traZODone (DESYREL) 50 MG tablet Take 1 tablet (50 mg total) by mouth at bedtime as needed for sleep. Patient taking differently: Take 50 mg by mouth at bedtime.  12/14/18  Yes Mercy Riding, MD   Allergies  Allergen Reactions  . Sumatriptan Other (See Comments)    Numbness to face   . Statins Other (See Comments)    Leg pain  . Tape Rash and Other (See Comments)    Rash from dressing over port-a-cath    Review of Systems  Constitutional: Positive for activity change and fatigue.  Musculoskeletal: Positive for back pain.  Psychiatric/Behavioral: Positive for decreased concentration, dysphoric mood and sleep disturbance.   Physical Exam General: Alert, awake, in moderate distress.  HEENT: No bruits, no goiter, no JVD Heart: Regular rate and rhythm. No murmur appreciated. Lungs: Good air movement, clear Abdomen: Soft, nontender, nondistended, positive bowel sounds.  Ext: No significant edema Skin: Warm and dry Neuro: Grossly intact, nonfocal.  Vital Signs: BP 111/87 (BP Location: Right Arm)   Pulse 80   Temp 98.5 F (36.9 C) (Oral)   Resp 20   Ht 5' 7"  (1.702 m)   Wt 80.2 kg   SpO2 94%   BMI 27.69 kg/m  Pain Scale: 0-10 POSS *See Group Information*: 1-Acceptable,Awake and alert Pain Score: 4    SpO2: SpO2: 94 % O2 Device:SpO2: 94 % O2 Flow Rate: .O2 Flow Rate (L/min): 2 L/min  IO: Intake/output summary:   Intake/Output Summary (Last 24 hours) at 01/02/2019 2218 Last data filed at 01/02/2019 1800 Gross per 24 hour  Intake 438.33 ml  Output -  Net 438.33  ml    LBM: Last BM Date: 12/31/18 Baseline Weight: Weight: 80.2 kg Most recent weight: Weight: 80.2 kg     Palliative Assessment/Data:    Time Total: 50 minutes Greater than 50%  of this time was spent counseling and coordinating care related to the above assessment and plan.  Signed by: Micheline Rough, MD    Please contact Palliative Medicine Team phone at [phone] for questions and concerns.  For individual provider: See Shea Evans

## 2019-01-03 ENCOUNTER — Other Ambulatory Visit: Payer: 59

## 2019-01-03 ENCOUNTER — Ambulatory Visit: Payer: 59

## 2019-01-03 ENCOUNTER — Ambulatory Visit
Admission: RE | Admit: 2019-01-03 | Discharge: 2019-01-03 | Disposition: A | Discharge status: Home / Self Care | Payer: 59 | Source: Ambulatory Visit | Attending: Radiation Oncology | Admitting: Radiation Oncology

## 2019-01-03 ENCOUNTER — Inpatient Hospital Stay (HOSPITAL_COMMUNITY): Payer: 59

## 2019-01-03 ENCOUNTER — Ambulatory Visit: Payer: 59 | Admitting: Hematology and Oncology

## 2019-01-03 DIAGNOSIS — T402X5A Adverse effect of other opioids, initial encounter: Secondary | ICD-10-CM

## 2019-01-03 DIAGNOSIS — R5081 Fever presenting with conditions classified elsewhere: Secondary | ICD-10-CM | POA: Insufficient documentation

## 2019-01-03 DIAGNOSIS — Z510 Encounter for antineoplastic radiation therapy: Secondary | ICD-10-CM | POA: Insufficient documentation

## 2019-01-03 DIAGNOSIS — K5903 Drug induced constipation: Secondary | ICD-10-CM

## 2019-01-03 DIAGNOSIS — C50512 Malignant neoplasm of lower-outer quadrant of left female breast: Secondary | ICD-10-CM | POA: Insufficient documentation

## 2019-01-03 DIAGNOSIS — D709 Neutropenia, unspecified: Secondary | ICD-10-CM | POA: Insufficient documentation

## 2019-01-03 DIAGNOSIS — Z171 Estrogen receptor negative status [ER-]: Secondary | ICD-10-CM | POA: Insufficient documentation

## 2019-01-03 DIAGNOSIS — C7951 Secondary malignant neoplasm of bone: Secondary | ICD-10-CM | POA: Insufficient documentation

## 2019-01-03 MED ORDER — GADOBUTROL 1 MMOL/ML IV SOLN
8.0000 mL | Freq: Once | INTRAVENOUS | Status: AC | PRN
  Administered 2019-01-03: 8 mL via INTRAVENOUS

## 2019-01-03 MED ORDER — SENNOSIDES-DOCUSATE SODIUM 8.6-50 MG PO TABS
2.0000 | ORAL_TABLET | Freq: Two times a day (BID) | ORAL | Status: DC
  Administered 2019-01-03 – 2019-01-06 (×6): 2 via ORAL
  Filled 2019-01-03 (×6): qty 2

## 2019-01-03 MED ORDER — PROCHLORPERAZINE MALEATE 10 MG PO TABS
10.0000 mg | ORAL_TABLET | Freq: Once | ORAL | Status: AC
  Administered 2019-01-03: 10 mg via ORAL
  Filled 2019-01-03: qty 1

## 2019-01-03 MED ORDER — SODIUM CHLORIDE 0.9 % IV SOLN
1.0500 mg/m2 | Freq: Once | INTRAVENOUS | Status: AC
  Administered 2019-01-03: 2 mg via INTRAVENOUS
  Filled 2019-01-03: qty 4

## 2019-01-03 MED ORDER — HYDROMORPHONE HCL 1 MG/ML IJ SOLN
1.0000 mg | Freq: Once | INTRAMUSCULAR | Status: AC
  Administered 2019-01-03: 2 mg via INTRAVENOUS

## 2019-01-03 MED ORDER — POLYETHYLENE GLYCOL 3350 17 G PO PACK
17.0000 g | PACK | Freq: Every day | ORAL | Status: DC
  Administered 2019-01-03 – 2019-01-06 (×4): 17 g via ORAL
  Filled 2019-01-03 (×4): qty 1

## 2019-01-03 MED ORDER — SODIUM CHLORIDE 0.9 % IV SOLN: Freq: Once | INTRAVENOUS | Status: DC

## 2019-01-03 MED ORDER — CHLORHEXIDINE GLUCONATE CLOTH 2 % EX PADS
6.0000 | MEDICATED_PAD | Freq: Every day | CUTANEOUS | Status: DC
  Administered 2019-01-03 – 2019-01-05 (×3): 6 via TOPICAL

## 2019-01-03 NOTE — Progress Notes (Signed)
PROGRESS NOTE    Laura Anthony  [id number] DOB: 10-11-1975 DOA: 01/02/2019 PCP: [name], MD    Brief Narrative:  43 year old lady with prior h/o metastatic breast cancer with bone mets , right upper extremity DVT on Eliquis, hyperlipidemia, right shoulder rotator cuff impingement syndrome, presents to ED for worsening back pain. She follows up with Dr Lindi Adie for her metastatic breast cancer.   Assessment & Plan:   Active Problems:   Inadequate pain control  Intractable Pain secondary to Bone mets from metastatic breast cancer:  CT abd and pelvis on 01/01/19 shows Multiple small bone metastases which are progressed since the PET-CT of 08/02/2018 but essentially unchanged since the pelvic CT of 12/11/2018 and chest CT of 12/09/2018. Healing fracture of the posterolateral aspect of the right eighth rib. It was followed up by a MRI  Thoracic spine , showing Findings consistent with metastatic disease in the T12 and T8 vertebral bodies  Negative for fracture. Negative for central canal or foraminal stenosis. No epidural Tumor.  Pain control with PCA dilaudid.  Appreciate palliative care input.  Pt reprots better pain control with MS contin and with PCA dilaudid.    Metastatic breast cancer : Further management as per oncology. Continue with IV decadron .    Hypothyroidism:  Resume synthroid.     Right upper extremity DVT.  Resume Eliquis.      DVT prophylaxis: eliquis.  Code Status: full code.  Family Communication: none at bedside. Disposition Plan: pending clinical improvement and pain control.    Consultants:   Oncology   Palliative care.    Procedures: none.   Antimicrobials: none.   Subjective: Pain better controlled with pCA dilaudid.   Objective: Vitals:   01/03/19 0800 01/03/19 1014 01/03/19 1158 01/03/19 1212  BP:  102/71  115/75  Pulse:  84  73  Resp: 10 15 10 12   Temp:  98.6 F (37 C)  99.5 F (37.5 C)  TempSrc:   Oral  Oral  SpO2: 96% 97% 92% (!) 89%  Weight:      Height:        Intake/Output Summary (Last 24 hours) at 01/03/2019 1556 Last data filed at 01/03/2019 1419 Gross per 24 hour  Intake 918.33 ml  Output 900 ml  Net 18.33 ml   Filed Weights   01/02/19 1557  Weight: 80.2 kg    Examination:  General exam: Appears calm and comfortable  Respiratory system: diminished air entry at bases.  Cardiovascular system: S1 & S2 heard, RRR. No JVD,. No pedal edema. Gastrointestinal system: Abdomen is nondistended, soft and nontender. . Normal bowel sounds heard. Central nervous system: Alert and oriented. No focal neurological deficits. Extremities: Symmetric 5 x 5 power. Skin: No rashes, lesions or ulcers Psychiatry:  Mood & affect appropriate.     Data Reviewed: I have personally reviewed following labs and imaging studies  CBC: Recent Labs  Lab 01/01/19 1129 01/02/19 0821  WBC 7.4 8.5  NEUTROABS 3.3 5.6  HGB 10.3* 10.1*  HCT 31.9* 31.5*  MCV 106.3* 108.6*  PLT 156 Q000111Q*   Basic Metabolic Panel: Recent Labs  Lab 01/01/19 1129 01/02/19 0821  NA 141 138  K 3.6 3.6  CL 100 101  CO2 30 28  GLUCOSE 78 144*  BUN 13 17  CREATININE 0.85 1.00  CALCIUM 9.3 8.9   GFR: Estimated Creatinine Clearance: 79.8 mL/min (by C-G formula based on SCr of 1 mg/dL). Liver Function Tests: Recent Labs  Lab 01/01/19  1129  AST 51*  ALT 59*  ALKPHOS 104  BILITOT 0.2*  PROT 7.0  ALBUMIN 3.7   No results for input(s): LIPASE, AMYLASE in the last 168 hours. No results for input(s): AMMONIA in the last 168 hours. Coagulation Profile: No results for input(s): INR, PROTIME in the last 168 hours. Cardiac Enzymes: No results for input(s): CKTOTAL, CKMB, CKMBINDEX, TROPONINI in the last 168 hours. BNP (last 3 results) No results for input(s): PROBNP in the last 8760 hours. HbA1C: No results for input(s): HGBA1C in the last 72 hours. CBG: No results for input(s): GLUCAP in the last 168  hours. Lipid Profile: No results for input(s): CHOL, HDL, LDLCALC, TRIG, CHOLHDL, LDLDIRECT in the last 72 hours. Thyroid Function Tests: No results for input(s): TSH, T4TOTAL, FREET4, T3FREE, THYROIDAB in the last 72 hours. Anemia Panel: No results for input(s): VITAMINB12, FOLATE, FERRITIN, TIBC, IRON, RETICCTPCT in the last 72 hours. Sepsis Labs: No results for input(s): PROCALCITON, LATICACIDVEN in the last 168 hours.  Recent Results (from the past 240 hour(s))  SARS CORONAVIRUS 2 (TAT 6-24 HRS) Nasopharyngeal Nasopharyngeal Swab     Status: None   Collection Time: 01/02/19 11:25 AM   Specimen: Nasopharyngeal Swab  Result Value Ref Range Status   SARS Coronavirus 2 NEGATIVE NEGATIVE Final    Comment: (NOTE) SARS-CoV-2 target nucleic acids are NOT DETECTED. The SARS-CoV-2 RNA is generally detectable in upper and lower respiratory specimens during the acute phase of infection. Negative results do not preclude SARS-CoV-2 infection, do not rule out co-infections with other pathogens, and should not be used as the sole basis for treatment or other patient management decisions. Negative results must be combined with clinical observations, patient history, and epidemiological information. The expected result is Negative. Fact Sheet for Patients: SugarRoll.be Fact Sheet for Healthcare Providers: https://www.woods-mathews.com/ This test is not yet approved or cleared by the [place] FDA and  has been authorized for detection and/or diagnosis of SARS-CoV-2 by FDA under an Emergency Use Authorization (EUA). This EUA will remain  in effect (meaning this test can be used) for the duration of the COVID-19 declaration under Section 56 4(b)(1) of the Act, 21 U.S.C. section 360bbb-3(b)(1), unless the authorization is terminated or revoked sooner. Performed at [facility], [place] [address] [place], [place] [address]           Radiology Studies: Mr Thoracic Spine W Wo Contrast  Result Date: 01/03/2019 CLINICAL DATA:  Worsening thoracic spine pain in a patient with metastatic breast cancer. EXAM: MRI THORACIC WITHOUT AND WITH CONTRAST TECHNIQUE: Multiplanar and multiecho pulse sequences of the thoracic spine were obtained without and with intravenous contrast. CONTRAST:  8 mL GADAVIST IV COMPARISON:  CT chest 12/09/2018. PET CT scan 08/02/2018. FINDINGS: MRI THORACIC SPINE FINDINGS Alignment:  Normal. Vertebrae: There is no fracture. An enhancing lesion in the central aspect of T12 is consistent with a metastatic deposit measuring approximately 1.2 cm AP x 1.1 cm craniocaudal x 1.7 cm transverse. A 0.5 cm in diameter enhancing lesion is also seen in the right side of the T8 vertebral body. Benign hemangioma in T8 is noted. Increased T1 and T2 signal in the majority of T10, throughout T11 and the superior aspect of T12 is most consistent with prior radiation therapy. Cord:  Normal signal throughout. No epidural tumor. Paraspinal and other soft tissues: Dependent atelectasis noted. 0.4 cm in diameter T2 hyperintense focus in the posterior arc of the right third rib may also represent metastatic disease Disc levels:  No disc bulge or protrusion. The central canal and foramina are widely patent at all levels. IMPRESSION: 1. Findings consistent with metastatic disease in the T12 and T8 vertebral bodies as described above. Negative for fracture. 2. Negative for central canal or foraminal stenosis. No epidural tumor. Electronically Signed   By: Inge Rise M.D.   On: 01/03/2019 15:42        Scheduled Meds: . apixaban  5 mg Oral BID  . Chlorhexidine Gluconate Cloth  6 each Topical Daily  . cyclobenzaprine  7.5 mg Oral TID  . dexamethasone (DECADRON) injection  4 mg Intravenous Q12H  . eriBULin mesylate ([place]) CHEMO IV infusion  1.05 mg/m2 (Treatment Plan Recorded) Intravenous Once  . FLUoxetine  40 mg Oral BH-q7a  .  gabapentin  300 mg Oral QPM  . HYDROmorphone   Intravenous Q4H  . levothyroxine  88 mcg Oral Q0600  . lisdexamfetamine  30 mg Oral q morning - 10a  . morphine  15 mg Oral Q12H  . morphine  30 mg Oral Q12H  . polyethylene glycol  17 g Oral Daily  . prochlorperazine  10 mg Oral Once  . senna-docusate  2 tablet Oral BID   Continuous Infusions: . sodium chloride 100 mL/hr at 01/03/19 1402  . sodium chloride       LOS: 1 day        Hosie Poisson, MD [facility]  01/03/2019, 3:56 PM

## 2019-01-03 NOTE — Progress Notes (Signed)
Spoke with Dr. Lindi Adie - patient will receive eribulin only today. Dose will be reduce to 1.1mg /m2 for today and future doses due to the recent neutropenic fever admission at an outside facility.   Trastuzumab and Pertuzumab to be resumed on an outpatient basis with D8 of her treatments.   Demetrius Charity, PharmD, [facility] Pharmac[facility] Phone: [phone] 01/03/2019

## 2019-01-03 NOTE — Progress Notes (Signed)
Hematology oncology Patient is due for her chemotherapy today. We will try to give her chemotherapy either today or tomorrow depending on pharmacy and nursing ability.

## 2019-01-03 NOTE — Progress Notes (Signed)
Daily Progress Note   Patient Name: Laura Anthony       Date: 01/03/2019 DOB: 1975/10/02  Age: 43 y.o. MRN#: [id number] Attending Physician: Hosie Poisson, MD Primary Care Physician: [name], MD Admit Date: 01/02/2019  Reason for Consultation/Follow-up: Pain control  Subjective: I saw and examined Laura Anthony today.  She reports pain better controlled today with PCA.  Currently rates pain 5/10 (vs 10/10 yeterday).  Reports goa would be 3/10.  She also had radiation today.  Reports pain usually responds really well to radiation.  In the last 24 hours she has had: MS Contin: 45mg  x 2 doses Dilaudid IV 8.5mg  via IV push and PCA  Total oral morphine equivalent of approx 260mg  in 24 hours.  Still with constipation.  Length of Stay: 1  Current Medications: Scheduled Meds:  . apixaban  5 mg Oral BID  . Chlorhexidine Gluconate Cloth  6 each Topical Daily  . cyclobenzaprine  7.5 mg Oral TID  . dexamethasone (DECADRON) injection  4 mg Intravenous Q12H  . FLUoxetine  40 mg Oral BH-q7a  . gabapentin  300 mg Oral QPM  . HYDROmorphone   Intravenous Q4H  . levothyroxine  88 mcg Oral Q0600  . lisdexamfetamine  30 mg Oral q morning - 10a  . morphine  15 mg Oral Q12H  . morphine  30 mg Oral Q12H  . polyethylene glycol  17 g Oral Daily  . senna-docusate  2 tablet Oral BID    Continuous Infusions: . sodium chloride 100 mL/hr at 01/03/19 1402  . sodium chloride      PRN Meds: diphenhydrAMINE **OR** diphenhydrAMINE, naloxone **AND** sodium chloride flush, ondansetron (ZOFRAN) IV, prochlorperazine  Physical Exam         General: Alert, awake, in no distress.  Much brighter and more interactive  HEENT: No bruits, no goiter, no JVD Heart: Regular rate and rhythm. No  murmur appreciated. Lungs: Good air movement, clear Abdomen: Soft, nontender, nondistended, positive bowel sounds.  Ext: No significant edema Skin: Warm and dry Neuro: Grossly intact, nonfocal.  Vital Signs: BP 115/74 (BP Location: Right Arm)   Pulse 73   Temp 98.8 F (37.1 C) (Oral)   Resp 10   Ht 5\' 7"  (1.702 m)   Wt 80.2 kg   SpO2 98%   BMI 27.69  kg/m  SpO2: SpO2: 98 % O2 Device: O2 Device: Nasal Cannula O2 Flow Rate: O2 Flow Rate (L/min): 2 L/min  Intake/output summary:   Intake/Output Summary (Last 24 hours) at 01/03/2019 2221 Last data filed at 01/03/2019 2008 Gross per 24 hour  Intake 480 ml  Output 1200 ml  Net -720 ml   LBM: Last BM Date: 12/31/18 Baseline Weight: Weight: 80.2 kg Most recent weight: Weight: 80.2 kg       Palliative Assessment/Data:      Patient Active Problem List   Diagnosis Date Noted  . Inadequate pain control 01/02/2019  . Acute embolism and thrombosis of unspecified deep veins of unspecified lower extremity ([place]) 12/26/2018  . Neutropenic fever ([place]) 12/26/2018  . Sorethroat 12/26/2018  . 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 Neck pain 12/09/2018  . Hypothyroidism 12/09/2018  . Goals of care, counseling/discussion 12/06/2018  . Metastatic breast cancer ([place]) 08/08/2018  . Bone metastases ([place]) 08/07/2018  . Pain from bone metastases ([place]) 08/07/2018  . Family history of breast cancer 04/02/2018  . Family history of prostate cancer 04/02/2018  . History of therapeutic radiation 10/16/2017  . Acquired absence of both breasts 04/03/2017  . Breast cancer, stage 2, left ([place]) 03/27/2017  . Port-A-Cath in place 12/16/2016  . Genetic testing 12/06/2016  . Encounter for antineoplastic chemotherapy 11/25/2016  . Malignant neoplasm of lower-outer quadrant of left breast of female, estrogen receptor negative ([place]) 10/27/2016  . Breast lump on left side at  1 o'clock position 05/22/2015  . Migraine without aura or status migrainosus 05/29/2014  . Hyperlipidemia LDL goal <100 10/22/2013  . Depression 09/09/2013  . Rotator cuff impingement syndrome of left shoulder 07/12/2013  . ADD (attention deficit disorder) 01/09/2013  . Anxiety 12/13/2012  . Insomnia 12/13/2012    Palliative Care Assessment & Plan   Patient Profile: 43 y.o. female  with past medical history of metastatic breast cancer admitted on 01/02/2019 with intractable back pain.  Palliative consulted for pain management.   Assessment: Patient Active Problem List   Diagnosis Date Noted  . Inadequate pain control 01/02/2019  . Acute embolism and thrombosis of unspecified deep veins of unspecified lower extremity ([place]) 12/26/2018  . Neutropenic fever ([place]) 12/26/2018  . Sorethroat 12/26/2018  . 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 Neck pain 12/09/2018  . Hypothyroidism 12/09/2018  . Goals of care, counseling/discussion 12/06/2018  . Metastatic breast cancer ([place]) 08/08/2018  . Bone metastases ([place]) 08/07/2018  . Pain from bone metastases ([place]) 08/07/2018  . Family history of breast cancer 04/02/2018  . Family history of prostate cancer 04/02/2018  . History of therapeutic radiation 10/16/2017  . Acquired absence of both breasts 04/03/2017  . Breast cancer, stage 2, left ([place]) 03/27/2017  . Port-A-Cath in place 12/16/2016  . Genetic testing 12/06/2016  . Encounter for antineoplastic chemotherapy 11/25/2016  . Malignant neoplasm of lower-outer quadrant of left breast of female, estrogen receptor negative ([place]) 10/27/2016  . Breast lump on left side at 1 o'clock position 05/22/2015  . Migraine without aura or status migrainosus 05/29/2014  . Hyperlipidemia LDL goal <100 10/22/2013  . Depression 09/09/2013  . Rotator cuff impingement syndrome of left shoulder 07/12/2013  . ADD (attention  deficit disorder) 01/09/2013  . Anxiety 12/13/2012  . Insomnia 12/13/2012   Recommendations/Plan: - Pain: bone pain with somatic as well  as neuropathic components.  Improved today  Plan for continuation of steroids as well as PCA.  Would like to get a full 24 hours on PCA to get better idea of her overall needs prior to working to transition to oral regimen. - Constipation: Opioid related.  Addition of senna and miralax.  Reports good results with these in the past.    Goals of Care and Additional Recommendations:  Limitations on Scope of Treatment: Full Scope Treatment  Code Status:    Code Status Orders  (From admission, onward)         Start     Ordered   01/02/19 1209  Full code  Continuous     01/02/19 1208        Code Status History    Date Active Date Inactive Code Status Order ID Comments User Context   12/09/2018 2151 12/14/2018 2023 Full Code [id number]  Shela Leff, MD ED   03/27/2017 1311 03/29/2017 1520 Full Code [id number]  Rolm Bookbinder, MD Inpatient   Advance Care Planning Activity       Prognosis:   Unable to determine  Discharge Planning:  Home with [place] was discussed with patient  Thank you for allowing the Palliative Medicine Team to assist in the care of this patient.   Time In: 1020 Time Out: 1100 Total Time 40 Prolonged Time Billed No      Greater than 50%  of this time was spent counseling and coordinating care related to the above assessment and plan.  Micheline Rough, MD  Please contact Palliative Medicine Team phone at [phone] for questions and concerns.

## 2019-01-04 ENCOUNTER — Ambulatory Visit: Payer: 59

## 2019-01-04 ENCOUNTER — Ambulatory Visit
Admission: RE | Admit: 2019-01-04 | Discharge: 2019-01-04 | Disposition: A | Discharge status: Home / Self Care | Payer: 59 | Source: Ambulatory Visit | Attending: Radiation Oncology | Admitting: Radiation Oncology

## 2019-01-04 ENCOUNTER — Telehealth: Payer: Self-pay

## 2019-01-04 LAB — CBC WITH DIFFERENTIAL/PLATELET
Abs Immature Granulocytes: 0.13 10*3/uL — ABNORMAL HIGH (ref 0.00–0.07)
Basophils Absolute: 0 10*3/uL (ref 0.0–0.1)
Basophils Relative: 0 %
Eosinophils Absolute: 0 10*3/uL (ref 0.0–0.5)
Eosinophils Relative: 0 %
HCT: 28.8 % — ABNORMAL LOW (ref 36.0–46.0)
Hemoglobin: 9.3 g/dL — ABNORMAL LOW (ref 12.0–15.0)
Immature Granulocytes: 1 %
Lymphocytes Relative: 4 %
Lymphs Abs: 0.5 10*3/uL — ABNORMAL LOW (ref 0.7–4.0)
MCH: 34.1 pg — ABNORMAL HIGH (ref 26.0–34.0)
MCHC: 32.3 g/dL (ref 30.0–36.0)
MCV: 105.5 fL — ABNORMAL HIGH (ref 80.0–100.0)
Monocytes Absolute: 0.7 10*3/uL (ref 0.1–1.0)
Monocytes Relative: 6 %
Neutro Abs: 9.7 10*3/uL — ABNORMAL HIGH (ref 1.7–7.7)
Neutrophils Relative %: 89 %
Platelets: 125 10*3/uL — ABNORMAL LOW (ref 150–400)
RBC: 2.73 MIL/uL — ABNORMAL LOW (ref 3.87–5.11)
RDW: 16.3 % — ABNORMAL HIGH (ref 11.5–15.5)
WBC: 11 10*3/uL — ABNORMAL HIGH (ref 4.0–10.5)
nRBC: 0.5 % — ABNORMAL HIGH (ref 0.0–0.2)

## 2019-01-04 LAB — COMPREHENSIVE METABOLIC PANEL
ALT: 30 U/L (ref 0–44)
AST: 24 U/L (ref 15–41)
Albumin: 3.2 g/dL — ABNORMAL LOW (ref 3.5–5.0)
Alkaline Phosphatase: 89 U/L (ref 38–126)
Anion gap: 10 (ref 5–15)
BUN: 15 mg/dL (ref 6–20)
CO2: 26 mmol/L (ref 22–32)
Calcium: 8.9 mg/dL (ref 8.9–10.3)
Chloride: 102 mmol/L (ref 98–111)
Creatinine, Ser: 0.65 mg/dL (ref 0.44–1.00)
GFR calc Af Amer: >60 mL/min (ref >60)
GFR calc non Af Amer: >60 mL/min (ref >60)
Glucose, Bld: 137 mg/dL — ABNORMAL HIGH (ref 70–99)
Potassium: 3.7 mmol/L (ref 3.5–5.1)
Sodium: 138 mmol/L (ref 135–145)
Total Bilirubin: 0.3 mg/dL (ref 0.3–1.2)
Total Protein: 6.8 g/dL (ref 6.5–8.1)

## 2019-01-04 MED ORDER — PANTOPRAZOLE SODIUM 40 MG PO TBEC
40.0000 mg | DELAYED_RELEASE_TABLET | Freq: Every day | ORAL | Status: DC
  Administered 2019-01-04 – 2019-01-06 (×3): 40 mg via ORAL
  Filled 2019-01-04 (×3): qty 1

## 2019-01-04 MED ORDER — MORPHINE SULFATE ER 30 MG PO TBCR
60.0000 mg | EXTENDED_RELEASE_TABLET | Freq: Two times a day (BID) | ORAL | Status: DC
  Administered 2019-01-04 – 2019-01-06 (×4): 60 mg via ORAL
  Filled 2019-01-04 (×4): qty 2

## 2019-01-04 NOTE — Progress Notes (Signed)
Daily Progress Note   Patient Name: Laura Anthony       Date: 01/04/2019 DOB: [birth year]  Age: 43 y.o. MRN#: [id number] Attending Physician: Hosie Poisson, MD Primary Care Physician: [name], MD Admit Date: 01/02/2019  Reason for Consultation/Follow-up: Pain control  Subjective: I saw and examined Laura Anthony today.  She reports pain remains controlled today with PCA.  Slept better last evening and also had BM last night.  In the last 24 hours she has had: MS Contin: 45mg  x 2 doses Dilaudid IV 13mg  via IV PCA  In the last 12 hours, she has only used 4mg  via PCA  Total oral morphine equivalent of approx 350mg  in 24 hours.  Still with constipation.  Length of Stay: 2  Current Medications: Scheduled Meds:  . apixaban  5 mg Oral BID  . Chlorhexidine Gluconate Cloth  6 each Topical Daily  . cyclobenzaprine  7.5 mg Oral TID  . dexamethasone (DECADRON) injection  4 mg Intravenous Q12H  . FLUoxetine  40 mg Oral BH-q7a  . gabapentin  300 mg Oral QPM  . HYDROmorphone   Intravenous Q4H  . levothyroxine  88 mcg Oral Q0600  . lisdexamfetamine  30 mg Oral q morning - 10a  . morphine  60 mg Oral Q12H  . pantoprazole  40 mg Oral Q0600  . polyethylene glycol  17 g Oral Daily  . senna-docusate  2 tablet Oral BID    Continuous Infusions: . sodium chloride 100 mL/hr at 01/04/19 0846  . sodium chloride      PRN Meds: diphenhydrAMINE **OR** diphenhydrAMINE, naloxone **AND** sodium chloride flush, ondansetron (ZOFRAN) IV, prochlorperazine  Physical Exam         General: Alert, awake, in no distress.   HEENT: No bruits, no goiter, no JVD Heart: Regular rate and rhythm. No murmur appreciated. Lungs: Good air movement, clear Abdomen: Soft, nontender, nondistended,  positive bowel sounds.  Ext: No significant edema Skin: Warm and dry Neuro: Grossly intact, nonfocal.  Vital Signs: BP 110/74 (BP Location: Left Arm)   Pulse 80   Temp 98.6 F (37 C) (Oral)   Resp 20   Ht 5\' 7"  (1.702 m)   Wt 80.2 kg   SpO2 100%   BMI 27.69 kg/m  SpO2: SpO2: 100 % O2 Device: O2 Device: Nasal Cannula  O2 Flow Rate: O2 Flow Rate (L/min): 2 L/min  Intake/output summary:   Intake/Output Summary (Last 24 hours) at 01/04/2019 1126 Last data filed at 01/04/2019 0846 Gross per 24 hour  Intake 1951.67 ml  Output 600 ml  Net 1351.67 ml   LBM: Last BM Date: 12/31/18 Baseline Weight: Weight: 80.2 kg Most recent weight: Weight: 80.2 kg       Palliative Assessment/Data:      Patient Active Problem List   Diagnosis Date Noted  . Inadequate pain control 01/02/2019  . Acute embolism and thrombosis of unspecified deep veins of unspecified lower extremity ([place]) 12/26/2018  . Neutropenic fever ([place]) 12/26/2018  . Sorethroat 12/26/2018  . 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 Neck pain 12/09/2018  . Hypothyroidism 12/09/2018  . Goals of care, counseling/discussion 12/06/2018  . Metastatic breast cancer ([place]) 08/08/2018  . Bone metastases ([place]) 08/07/2018  . Pain from bone metastases ([place]) 08/07/2018  . Family history of breast cancer 04/02/2018  . Family history of prostate cancer 04/02/2018  . History of therapeutic radiation 10/16/2017  . Acquired absence of both breasts 04/03/2017  . Breast cancer, stage 2, left ([place]) 03/27/2017  . Port-A-Cath in place 12/16/2016  . Genetic testing 12/06/2016  . Encounter for antineoplastic chemotherapy 11/25/2016  . Malignant neoplasm of lower-outer quadrant of left breast of female, estrogen receptor negative ([place]) 10/27/2016  . Breast lump on left side at 1 o'clock position 05/22/2015  . Migraine without aura or status migrainosus 05/29/2014   . Hyperlipidemia LDL goal <100 10/22/2013  . Depression 09/09/2013  . Rotator cuff impingement syndrome of left shoulder 07/12/2013  . ADD (attention deficit disorder) 01/09/2013  . Anxiety 12/13/2012  . Insomnia 12/13/2012    Palliative Care Assessment & Plan   Patient Profile: 43 y.o. female  with past medical history of metastatic breast cancer admitted on 01/02/2019 with intractable back pain.  Palliative consulted for pain management.   Assessment: Patient Active Problem List   Diagnosis Date Noted  . Inadequate pain control 01/02/2019  . Acute embolism and thrombosis of unspecified deep veins of unspecified lower extremity ([place]) 12/26/2018  . Neutropenic fever ([place]) 12/26/2018  . Sorethroat 12/26/2018  . 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 Complete lesion at C3 level of cervical spinal cord ([place]) 12/10/2018  . Neck pain 12/09/2018  . Hypothyroidism 12/09/2018  . Goals of care, counseling/discussion 12/06/2018  . Metastatic breast cancer ([place]) 08/08/2018  . Bone metastases ([place]) 08/07/2018  . Pain from bone metastases ([place]) 08/07/2018  . Family history of breast cancer 04/02/2018  . Family history of prostate cancer 04/02/2018  . History of therapeutic radiation 10/16/2017  . Acquired absence of both breasts 04/03/2017  . Breast cancer, stage 2, left ([place]) 03/27/2017  . Port-A-Cath in place 12/16/2016  . Genetic testing 12/06/2016  . Encounter for antineoplastic chemotherapy 11/25/2016  . Malignant neoplasm of lower-outer quadrant of left breast of female, estrogen receptor negative ([place]) 10/27/2016  . Breast lump on left side at 1 o'clock position 05/22/2015  . Migraine without aura or status migrainosus 05/29/2014  . Hyperlipidemia LDL goal <100 10/22/2013  . Depression 09/09/2013  . Rotator cuff impingement syndrome of left shoulder 07/12/2013  . ADD (attention deficit disorder) 01/09/2013  . Anxiety 12/13/2012  . Insomnia 12/13/2012    Recommendations/Plan: - Pain: bone pain with somatic as well as neuropathic components.  Plan for continuation of steroids as well as  PCA.  In the last 12 hours, her needs seem to have come down significantly (4mg  in 12 hours vs 9 mg in the prior 12 hours).  Will plan for slight increase in the MS Contin to 60 BID.  Continue PCA.  She generally responds well to radiation, so will favor higher doses of short acting medication for at least the next couple of weeks until we see how her pain responds to treatments.  She feels that dilaudid works better than oxycodone for her pain and so will likely trial oral dilaudid for breakthrough medication as we titrate off PCA in the next day or two. - Constipation: Opioid related.  BM last night.  Continue senna and miralax.   Goals of Care and Additional Recommendations:  Limitations on Scope of Treatment: Full Scope Treatment  Code Status:    Code Status Orders  (From admission, onward)         Start     Ordered   01/02/19 1209  Full code  Continuous     01/02/19 1208        Code Status History    Date Active Date Inactive Code Status Order ID Comments User Context   12/09/2018 2151 12/14/2018 2023 Full Code [id number]  Shela Leff, MD ED   03/27/2017 1311 03/29/2017 1520 Full Code [id number]  Rolm Bookbinder, MD Inpatient   Advance Care Planning Activity       Prognosis:   Unable to determine  Discharge Planning:  Home with [place] was discussed with patient  Thank you for allowing the Palliative Medicine Team to assist in the care of this patient.   Time In: 1110 Time Out: 1135 Total Time 25 Prolonged Time Billed No      Greater than 50%  of this time was spent counseling and coordinating care related to the above assessment and plan.  Micheline Rough, MD  Please contact Palliative Medicine Team phone at [phone] for questions and concerns.

## 2019-01-04 NOTE — TOC Initial Note (Signed)
Transition of Care [facility]) - Initial/Assessment Note    Patient Details  Name: Laura Anthony MRN: [id number] Date of Birth: 1976/01/24  Transition of Care (TOC) CM/SW Contact:    Lynnell Catalan, RN Phone Number: 01/04/2019, 10:54 AM    Expected Discharge Plan: Home/Self Care Barriers to Discharge: Continued Medical Work up   Expected Discharge Plan and Services Expected Discharge Plan: Home/Self Care       Prior Living Arrangements/Services   Lives with:: Spouse                   Activities of Daily Living Home Assistive Devices/Equipment: Bedside commode/3-in-1, Built-in shower seat, Other (Comment), Walker (specify type)(4 wheeled walker-rollator, walk-in shower) ADL Screening (condition at time of admission) Patient's cognitive ability adequate to safely complete daily activities?: Yes Is the patient deaf or have difficulty hearing?: No Does the patient have difficulty seeing, even when wearing glasses/contacts?: No Does the patient have difficulty concentrating, remembering, or making decisions?: No Patient able to express need for assistance with ADLs?: Yes Does the patient have difficulty dressing or bathing?: Yes(secondary to increased back pain) Independently performs ADLs?: No(secondary to increased back pain) Communication: Independent Dressing (OT): Needs assistance Is this a change from baseline?: Change from baseline, expected to last >3 days Grooming: Needs assistance Is this a change from baseline?: Change from baseline, expected to last >3 days Feeding: Independent Bathing: Needs assistance Is this a change from baseline?: Change from baseline, expected to last >3 days Toileting: Needs assistance Is this a change from baseline?: Change from baseline, expected to last >3days In/Out Bed: Needs assistance Is this a change from baseline?: Change from baseline, expected to last >3 days Walks in Home: Needs assistance Is this a change from  baseline?: Change from baseline, expected to last >3 days Does the patient have difficulty walking or climbing stairs?: Yes(secondary to increased back pain) Weakness of Legs: Both Weakness of Arms/Hands: None  Admission diagnosis:  Metastatic breast cancer ([place]) [C50.919] Patient Active Problem List   Diagnosis Date Noted  . Inadequate pain control 01/02/2019  . Acute embolism and thrombosis of unspecified deep veins of unspecified lower extremity ([place]) 12/26/2018  . Neutropenic fever ([place]) 12/26/2018  . Sorethroat 12/26/2018  . 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 Neck pain 12/09/2018  . Hypothyroidism 12/09/2018  . Goals of care, counseling/discussion 12/06/2018  . Metastatic breast cancer ([place]) 08/08/2018  . Bone metastases ([place]) 08/07/2018  . Pain from bone metastases ([place]) 08/07/2018  . Family history of breast cancer 04/02/2018  . Family history of prostate cancer 04/02/2018  . History of therapeutic radiation 10/16/2017  . Acquired absence of both breasts 04/03/2017  . Breast cancer, stage 2, left ([place]) 03/27/2017  . Port-A-Cath in place 12/16/2016  . Genetic testing 12/06/2016  . Encounter for antineoplastic chemotherapy 11/25/2016  . Malignant neoplasm of lower-outer quadrant of left breast of female, estrogen receptor negative ([place]) 10/27/2016  . Breast lump on left side at 1 o'clock position 05/22/2015  . Migraine without aura or status migrainosus 05/29/2014  . Hyperlipidemia LDL goal <100 10/22/2013  . Depression 09/09/2013  . Rotator cuff impingement syndrome of left shoulder 07/12/2013  . ADD (attention deficit disorder) 01/09/2013  . Anxiety 12/13/2012  . Insomnia 12/13/2012   PCP:  [name], MD Pharmacy:   [id number] [place], [place] - [address]  [address] [place] [address] Phone: [phone] Fax:  [place], [place] - [place] [address] Phone: [phone] Fax: [phone]  [facility] [place], [place] [address] [place] [address] Phone: [phone] Fax: [phone]     Social Determinants of Health (SDOH) Interventions    Readmission Risk Interventions Readmission Risk Prevention Plan 01/04/2019  Transportation Screening Complete  PCP or Specialist Appt within 3-5 Days Complete  HRI or [place] Not Complete  HRI or Home Care Consult comments NA  Social Work Consult for [place] Planning/Counseling Not Complete  SW consult not completed comments NA  Palliative Care Screening Not Applicable  Medication Review Press photographer) Complete  Some recent data might be hidden

## 2019-01-04 NOTE — Telephone Encounter (Signed)
Called patient and scheduled palliative care consult visit for 12/8 at 3 pm with NP. Pt remains at hospital, but is hopeful to D/C home this weekend

## 2019-01-04 NOTE — Progress Notes (Signed)
PROGRESS NOTE    Laura Anthony  [id number] DOB: 1975/11/10 DOA: 01/02/2019 PCP: [name], MD    Brief Narrative:  43 year old lady with prior h/o metastatic breast cancer with bone mets , right upper extremity DVT on Eliquis, hyperlipidemia, right shoulder rotator cuff impingement syndrome, presents to ED for worsening back pain. She follows up with Dr Lindi Adie for her metastatic breast cancer.   Assessment & Plan:   Active Problems:   Inadequate pain control  Intractable Pain secondary to Bone mets from metastatic breast cancer:  CT abd and pelvis on 01/01/19 shows Multiple small bone metastases which are progressed since the PET-CT of 08/02/2018 but essentially unchanged since the pelvic CT of 12/11/2018 and chest CT of 12/09/2018. Healing fracture of the posterolateral aspect of the right eighth rib. It was followed up by a MRI  Thoracic spine , showing Findings consistent with metastatic disease in the T12 and T8 vertebral bodies  Negative for fracture. Negative for central canal or foraminal stenosis. No epidural Tumor. Possible radiation treatment to the T SPINE , pt reports that her bone pain improves with radiation treatments.  Pain control with PCA dilaudid and plan to increase her MS contin that would adequately treat her pain.  Appreciate palliative care input.  Pt reprots better pain control with MS contin and with PCA dilaudid.    Constipation;  Resume senna, miralax.   Metastatic breast cancer : Further management as per oncology. Continue with IV decadron .    Hypothyroidism:  Resume synthroid.     Right upper extremity DVT.  Resume Eliquis.   Anemia of chronic disease  Hemoglobin stable around 10.    Mild thrombocytopenia ; Monitor.    DVT prophylaxis: eliquis.  Code Status: full code.  Family Communication: none at bedside. Disposition Plan: pending clinical improvement and pain control.    Consultants:   Oncology    Palliative care.    Procedures: none.   Antimicrobials: none.   Subjective: Pain better controlled with pCA dilaudid. She reports reflux symptoms, PPI added to her regimen.   Objective: Vitals:   01/04/19 1006 01/04/19 1215 01/04/19 1222 01/04/19 1354  BP: 110/74 117/75    Pulse: 80 70    Resp: 20 18 18 14   Temp:  99.1 F (37.3 C)    TempSrc:  Oral    SpO2: 100% 95% 95% 100%  Weight:      Height:        Intake/Output Summary (Last 24 hours) at 01/04/2019 1618 Last data filed at 01/04/2019 1500 Gross per 24 hour  Intake 1711.67 ml  Output 1700 ml  Net 11.67 ml   Filed Weights   01/02/19 1557  Weight: 80.2 kg    Examination:  General exam: Appears calm and comfortable not in distress,  Respiratory system: air entry fair.  No wheezing or rhonchi.  Cardiovascular system: S1S2, heard, RRR, no JVD.  Gastrointestinal system: abd is soft, non tender non distended bowel sounds wnl.  Central nervous system: alert and oriented.  Extremities: no pedal edema.  Skin: no rashes.  Psychiatry:  Mood appropriate.     Data Reviewed: I have personally reviewed following labs and imaging studies  CBC: Recent Labs  Lab 01/01/19 1129 01/02/19 0821  WBC 7.4 8.5  NEUTROABS 3.3 5.6  HGB 10.3* 10.1*  HCT 31.9* 31.5*  MCV 106.3* 108.6*  PLT 156 [id number]*   Basic Metabolic Panel: Recent Labs  Lab 01/01/19 1129 01/02/19 0821  NA 141 138  K 3.6 3.6  CL 100 101  CO2 30 28  GLUCOSE 78 144*  BUN 13 17  CREATININE 0.85 1.00  CALCIUM 9.3 8.9   GFR: Estimated Creatinine Clearance: 79.8 mL/min (by C-G formula based on SCr of 1 mg/dL). Liver Function Tests: Recent Labs  Lab 01/01/19 1129  AST 51*  ALT 59*  ALKPHOS 104  BILITOT 0.2*  PROT 7.0  ALBUMIN 3.7   No results for input(s): LIPASE, AMYLASE in the last 168 hours. No results for input(s): AMMONIA in the last 168 hours. Coagulation Profile: No results for input(s): INR, PROTIME in the last 168 hours. Cardiac  Enzymes: No results for input(s): CKTOTAL, CKMB, CKMBINDEX, TROPONINI in the last 168 hours. BNP (last 3 results) No results for input(s): PROBNP in the last 8760 hours. HbA1C: No results for input(s): HGBA1C in the last 72 hours. CBG: No results for input(s): GLUCAP in the last 168 hours. Lipid Profile: No results for input(s): CHOL, HDL, LDLCALC, TRIG, CHOLHDL, LDLDIRECT in the last 72 hours. Thyroid Function Tests: No results for input(s): TSH, T4TOTAL, FREET4, T3FREE, THYROIDAB in the last 72 hours. Anemia Panel: No results for input(s): VITAMINB12, FOLATE, FERRITIN, TIBC, IRON, RETICCTPCT in the last 72 hours. Sepsis Labs: No results for input(s): PROCALCITON, LATICACIDVEN in the last 168 hours.  Recent Results (from the past 240 hour(s))  SARS CORONAVIRUS 2 (TAT 6-24 HRS) Nasopharyngeal Nasopharyngeal Swab     Status: None   Collection Time: 01/02/19 11:25 AM   Specimen: Nasopharyngeal Swab  Result Value Ref Range Status   SARS Coronavirus 2 NEGATIVE NEGATIVE Final    Comment: (NOTE) SARS-CoV-2 target nucleic acids are NOT DETECTED. The SARS-CoV-2 RNA is generally detectable in upper and lower respiratory specimens during the acute phase of infection. Negative results do not preclude SARS-CoV-2 infection, do not rule out co-infections with other pathogens, and should not be used as the sole basis for treatment or other patient management decisions. Negative results must be combined with clinical observations, patient history, and epidemiological information. The expected result is Negative. Fact Sheet for Patients: SugarRoll.be Fact Sheet for Healthcare Providers: https://www.woods-mathews.com/ This test is not yet approved or cleared by the [place] FDA and  has been authorized for detection and/or diagnosis of SARS-CoV-2 by FDA under an Emergency Use Authorization (EUA). This EUA will remain  in effect (meaning this test can  be used) for the duration of the COVID-19 declaration under Section 56 4(b)(1) of the Act, 21 U.S.C. section 360bbb-3(b)(1), unless the authorization is terminated or revoked sooner. Performed at [facility], [place] [address] [place],  [address]          Radiology Studies: Mr Thoracic Spine W Wo Contrast  Result Date: 01/03/2019 CLINICAL DATA:  Worsening thoracic spine pain in a patient with metastatic breast cancer. EXAM: MRI THORACIC WITHOUT AND WITH CONTRAST TECHNIQUE: Multiplanar and multiecho pulse sequences of the thoracic spine were obtained without and with intravenous contrast. CONTRAST:  8 mL GADAVIST IV COMPARISON:  CT chest 12/09/2018. PET CT scan 08/02/2018. FINDINGS: MRI THORACIC SPINE FINDINGS Alignment:  Normal. Vertebrae: There is no fracture. An enhancing lesion in the central aspect of T12 is consistent with a metastatic deposit measuring approximately 1.2 cm AP x 1.1 cm craniocaudal x 1.7 cm transverse. A 0.5 cm in diameter enhancing lesion is also seen in the right side of the T8 vertebral body. Benign hemangioma in T8 is noted. Increased T1 and T2 signal in the majority of T10, throughout T11  and the superior aspect of T12 is most consistent with prior radiation therapy. Cord:  Normal signal throughout. No epidural tumor. Paraspinal and other soft tissues: Dependent atelectasis noted. 0.4 cm in diameter T2 hyperintense focus in the posterior arc of the right third rib may also represent metastatic disease Disc levels: No disc bulge or protrusion. The central canal and foramina are widely patent at all levels. IMPRESSION: 1. Findings consistent with metastatic disease in the T12 and T8 vertebral bodies as described above. Negative for fracture. 2. Negative for central canal or foraminal stenosis. No epidural tumor. Electronically Signed   By: Inge Rise M.D.   On: 01/03/2019 15:42        Scheduled Meds: . apixaban  5 mg Oral BID  . Chlorhexidine  Gluconate Cloth  6 each Topical Daily  . cyclobenzaprine  7.5 mg Oral TID  . dexamethasone (DECADRON) injection  4 mg Intravenous Q12H  . FLUoxetine  40 mg Oral BH-q7a  . gabapentin  300 mg Oral QPM  . HYDROmorphone   Intravenous Q4H  . levothyroxine  88 mcg Oral Q0600  . lisdexamfetamine  30 mg Oral q morning - 10a  . morphine  60 mg Oral Q12H  . pantoprazole  40 mg Oral Q0600  . polyethylene glycol  17 g Oral Daily  . senna-docusate  2 tablet Oral BID   Continuous Infusions: . sodium chloride 100 mL/hr at 01/04/19 1225  . sodium chloride       LOS: 2 days        Hosie Poisson, MD [facility]  01/04/2019, 4:18 PM

## 2019-01-05 MED ORDER — ALUM & MAG HYDROXIDE-SIMETH 200-200-20 MG/5ML PO SUSP: 15.0000 mL | Freq: Four times a day (QID) | ORAL | Status: DC | PRN

## 2019-01-05 MED ORDER — MAGNESIUM HYDROXIDE 400 MG/5ML PO SUSP: 15.0000 mL | Freq: Every day | ORAL | Status: DC | PRN

## 2019-01-05 MED ORDER — HYDROMORPHONE HCL 1 MG/ML IJ SOLN: 1.0000 mg | INTRAMUSCULAR | Status: DC | PRN

## 2019-01-05 MED ORDER — HYDROMORPHONE HCL 4 MG PO TABS
4.0000 mg | ORAL_TABLET | ORAL | Status: DC | PRN
  Administered 2019-01-05: 4 mg via ORAL
  Filled 2019-01-05: qty 1

## 2019-01-05 NOTE — Progress Notes (Signed)
PROGRESS NOTE    Laura Anthony  [id number] DOB: [birth year] DOA: 01/02/2019 PCP: [name], MD    Brief Narrative:  43 year old lady with prior h/o metastatic breast cancer with bone mets , right upper extremity DVT on Eliquis, hyperlipidemia, right shoulder rotator cuff impingement syndrome, presents to ED for worsening back pain. She follows up with Dr Lindi Adie for her metastatic breast cancer.   Assessment & Plan:   Active Problems:   Inadequate pain control  Intractable Pain secondary to Bone mets from metastatic breast cancer:  CT abd and pelvis on 01/01/19 shows Multiple small bone metastases which are progressed since the PET-CT of 08/02/2018 but essentially unchanged since the pelvic CT of 12/11/2018 and chest CT of 12/09/2018. Healing fracture of the posterolateral aspect of the right eighth rib. It was followed up by a MRI  Thoracic spine , showing Findings consistent with metastatic disease in the T12 and T8 vertebral bodies  Negative for fracture. Negative for central canal or foraminal stenosis. No epidural Tumor. Possible radiation treatment to the T SPINE , pt reports that her bone pain improves with radiation treatments.  Appreciate palliative input for adequate pain control.  Plan to transition to oral regimen today.  Constipation;  Resume senna, miralax.  Milk of magnesia him added  Metastatic breast cancer : Further management as per oncology. Continue with IV decadron .    Hypothyroidism:  Resume synthroid.     Right upper extremity DVT.  Resume Eliquis   Anemia of chronic disease  Hemoglobin stable between 9-10   Mild thrombocytopenia ; Monitor.  Platelets dropped to 125,000, no signs of bleeding.   DVT prophylaxis: eliquis.  Code Status: full code.  Family Communication: none at bedside. Disposition Plan: Possible discharge in the next 24 hours if adequate pain control is achieved   Consultants:   Oncology   Palliative  care.    Procedures: none.   Antimicrobials: none.   Subjective: Pain better controlled today reports reflux symptoms not well controlled with Protonix will add Mylanta.  Objective: Vitals:   01/05/19 0046 01/05/19 0520 01/05/19 0540 01/05/19 0710  BP:   114/75   Pulse:   63   Resp: 13 12 17 10   Temp:   98.4 F (36.9 C)   TempSrc:   Oral   SpO2: 98% 98% 95% 98%  Weight:      Height:        Intake/Output Summary (Last 24 hours) at 01/05/2019 1246 Last data filed at 01/05/2019 1017 Gross per 24 hour  Intake 2655.11 ml  Output 1400 ml  Net 1255.11 ml   Filed Weights   01/02/19 1557  Weight: 80.2 kg    Examination:  General exam: Calm and comfortable, not in any distress Respiratory system: Air entry fair bilaterally, no wheezing or rhonchi Cardiovascular system: S1-S2 heard, regular rate rhythm, no JVD, no pedal edema Gastrointestinal system: Abdomen is soft, nontender, nondistended bowel sounds normal Central nervous system: Alert and oriented, grossly nonfocal Extremities: No pedal edema Skin: No rashes or ulcers Psychiatry: Mood is appropriate    Data Reviewed: I have personally reviewed following labs and imaging studies  CBC: Recent Labs  Lab 01/01/19 1129 01/02/19 0821 01/04/19 1649  WBC 7.4 8.5 11.0*  NEUTROABS 3.3 5.6 9.7*  HGB 10.3* 10.1* 9.3*  HCT 31.9* 31.5* 28.8*  MCV 106.3* 108.6* 105.5*  PLT 156 141* 0000000*   Basic Metabolic Panel: Recent Labs  Lab 01/01/19 1129 01/02/19 0821 01/04/19 1649  NA 141 138 138  K 3.6 3.6 3.7  CL 100 101 102  CO2 30 28 26   GLUCOSE 78 144* 137*  BUN 13 17 15   CREATININE 0.85 1.00 0.65  CALCIUM 9.3 8.9 8.9   GFR: Estimated Creatinine Clearance: 99.8 mL/min (by C-G formula based on SCr of 0.65 mg/dL). Liver Function Tests: Recent Labs  Lab 01/01/19 1129 01/04/19 1649  AST 51* 24  ALT 59* 30  ALKPHOS 104 89  BILITOT 0.2* 0.3  PROT 7.0 6.8  ALBUMIN 3.7 3.2*   No results for input(s): LIPASE,  AMYLASE in the last 168 hours. No results for input(s): AMMONIA in the last 168 hours. Coagulation Profile: No results for input(s): INR, PROTIME in the last 168 hours. Cardiac Enzymes: No results for input(s): CKTOTAL, CKMB, CKMBINDEX, TROPONINI in the last 168 hours. BNP (last 3 results) No results for input(s): PROBNP in the last 8760 hours. HbA1C: No results for input(s): HGBA1C in the last 72 hours. CBG: No results for input(s): GLUCAP in the last 168 hours. Lipid Profile: No results for input(s): CHOL, HDL, LDLCALC, TRIG, CHOLHDL, LDLDIRECT in the last 72 hours. Thyroid Function Tests: No results for input(s): TSH, T4TOTAL, FREET4, T3FREE, THYROIDAB in the last 72 hours. Anemia Panel: No results for input(s): VITAMINB12, FOLATE, FERRITIN, TIBC, IRON, RETICCTPCT in the last 72 hours. Sepsis Labs: No results for input(s): PROCALCITON, LATICACIDVEN in the last 168 hours.  Recent Results (from the past 240 hour(s))  SARS CORONAVIRUS 2 (TAT 6-24 HRS) Nasopharyngeal Nasopharyngeal Swab     Status: None   Collection Time: 01/02/19 11:25 AM   Specimen: Nasopharyngeal Swab  Result Value Ref Range Status   SARS Coronavirus 2 NEGATIVE NEGATIVE Final    Comment: (NOTE) SARS-CoV-2 target nucleic acids are NOT DETECTED. The SARS-CoV-2 RNA is generally detectable in upper and lower respiratory specimens during the acute phase of infection. Negative results do not preclude SARS-CoV-2 infection, do not rule out co-infections with other pathogens, and should not be used as the sole basis for treatment or other patient management decisions. Negative results must be combined with clinical observations, patient history, and epidemiological information. The expected result is Negative. Fact Sheet for Patients: SugarRoll.be Fact Sheet for Healthcare Providers: https://www.woods-mathews.com/ This test is not yet approved or cleared by the [place]  FDA and  has been authorized for detection and/or diagnosis of SARS-CoV-2 by FDA under an Emergency Use Authorization (EUA). This EUA will remain  in effect (meaning this test can be used) for the duration of the COVID-19 declaration under Section 56 4(b)(1) of the Act, 21 U.S.C. section 360bbb-3(b)(1), unless the authorization is terminated or revoked sooner. Performed at [facility], [place] [address] [place], [place] [address]          Radiology Studies: Mr Thoracic Spine W Wo Contrast  Result Date: 01/03/2019 CLINICAL DATA:  Worsening thoracic spine pain in a patient with metastatic breast cancer. EXAM: MRI THORACIC WITHOUT AND WITH CONTRAST TECHNIQUE: Multiplanar and multiecho pulse sequences of the thoracic spine were obtained without and with intravenous contrast. CONTRAST:  8 mL GADAVIST IV COMPARISON:  CT chest 12/09/2018. PET CT scan 08/02/2018. FINDINGS: MRI THORACIC SPINE FINDINGS Alignment:  Normal. Vertebrae: There is no fracture. An enhancing lesion in the central aspect of T12 is consistent with a metastatic deposit measuring approximately 1.2 cm AP x 1.1 cm craniocaudal x 1.7 cm transverse. A 0.5 cm in diameter enhancing lesion is also seen in the right side of the T8  vertebral body. Benign hemangioma in T8 is noted. Increased T1 and T2 signal in the majority of T10, throughout T11 and the superior aspect of T12 is most consistent with prior radiation therapy. Cord:  Normal signal throughout. No epidural tumor. Paraspinal and other soft tissues: Dependent atelectasis noted. 0.4 cm in diameter T2 hyperintense focus in the posterior arc of the right third rib may also represent metastatic disease Disc levels: No disc bulge or protrusion. The central canal and foramina are widely patent at all levels. IMPRESSION: 1. Findings consistent with metastatic disease in the T12 and T8 vertebral bodies as described above. Negative for fracture. 2. Negative for central canal or foraminal  stenosis. No epidural tumor. Electronically Signed   By: Inge Rise M.D.   On: 01/03/2019 15:42        Scheduled Meds:  apixaban  5 mg Oral BID   Chlorhexidine Gluconate Cloth  6 each Topical Daily   cyclobenzaprine  7.5 mg Oral TID   dexamethasone (DECADRON) injection  4 mg Intravenous Q12H   FLUoxetine  40 mg Oral BH-q7a   gabapentin  300 mg Oral QPM   levothyroxine  88 mcg Oral Q0600   lisdexamfetamine  30 mg Oral q morning - 10a   morphine  60 mg Oral Q12H   pantoprazole  40 mg Oral Q0600   polyethylene glycol  17 g Oral Daily   senna-docusate  2 tablet Oral BID   Continuous Infusions:  sodium chloride 100 mL/hr at 01/05/19 0936   sodium chloride       LOS: 3 days        Hosie Poisson, MD [facility]  01/05/2019, 12:46 PM

## 2019-01-05 NOTE — Progress Notes (Signed)
Daily Progress Note   Patient Name: Laura Anthony       Date: 01/05/2019 DOB: 1975-06-01  Age: 43 y.o. MRN#: [id number] Attending Physician: Hosie Poisson, MD Primary Care Physician: [name], MD Admit Date: 01/02/2019  Reason for Consultation/Follow-up: Pain control  Subjective: I saw and examined Laura Anthony today.  She reports pain remains controlled today with PCA.  Discussed plan for transition to oral medication and she would like to attempt this today as she is hopeful to go home as soon as tomorrow.  In the last 24 hours she has had: MS Contin: 60mg  x 2 doses Dilaudid IV 6.5mg  via IV PCA  Total oral morphine equivalent of approx 250mg  in 24 hours.  Still with constipation.  Length of Stay: 3  Current Medications: Scheduled Meds:  . apixaban  5 mg Oral BID  . Chlorhexidine Gluconate Cloth  6 each Topical Daily  . cyclobenzaprine  7.5 mg Oral TID  . dexamethasone (DECADRON) injection  4 mg Intravenous Q12H  . FLUoxetine  40 mg Oral BH-q7a  . gabapentin  300 mg Oral QPM  . levothyroxine  88 mcg Oral Q0600  . lisdexamfetamine  30 mg Oral q morning - 10a  . morphine  60 mg Oral Q12H  . pantoprazole  40 mg Oral Q0600  . polyethylene glycol  17 g Oral Daily  . senna-docusate  2 tablet Oral BID    Continuous Infusions: . sodium chloride 100 mL/hr at 01/05/19 0936  . sodium chloride      PRN Meds: alum & mag hydroxide-simeth, HYDROmorphone (DILAUDID) injection, HYDROmorphone, magnesium hydroxide, prochlorperazine  Physical Exam         General: Alert, awake, in no distress.   HEENT: No bruits, no goiter, no JVD Heart: Regular rate and rhythm. No murmur appreciated. Lungs: Good air movement, clear Abdomen: Soft, nontender, nondistended, positive  bowel sounds.  Ext: No significant edema Skin: Warm and dry Neuro: Grossly intact, nonfocal.  Vital Signs: BP 109/76 (BP Location: Right Arm)   Pulse 77   Temp 98.5 F (36.9 C) (Oral)   Resp 16   Ht 5\' 7"  (1.702 m)   Wt 80.2 kg   SpO2 99%   BMI 27.69 kg/m  SpO2: SpO2: 99 % O2 Device: O2 Device: Nasal Cannula O2 Flow Rate: O2 Flow  Rate (L/min): 2 L/min  Intake/output summary:   Intake/Output Summary (Last 24 hours) at 01/05/2019 2239 Last data filed at 01/05/2019 1845 Gross per 24 hour  Intake 2215 ml  Output 1275 ml  Net 940 ml   LBM: Last BM Date: 01/04/19 Baseline Weight: Weight: 80.2 kg Most recent weight: Weight: 80.2 kg       Palliative Assessment/Data:      Patient Active Problem List   Diagnosis Date Noted  . Inadequate pain control 01/02/2019  . Acute embolism and thrombosis of unspecified deep veins of unspecified lower extremity ([place]) 12/26/2018  . Neutropenic fever ([place]) 12/26/2018  . Sorethroat 12/26/2018  . 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 Neck pain 12/09/2018  . Hypothyroidism 12/09/2018  . Goals of care, counseling/discussion 12/06/2018  . Metastatic breast cancer ([place]) 08/08/2018  . Bone metastases ([place]) 08/07/2018  . Pain from bone metastases ([place]) 08/07/2018  . Family history of breast cancer 04/02/2018  . Family history of prostate cancer 04/02/2018  . History of therapeutic radiation 10/16/2017  . Acquired absence of both breasts 04/03/2017  . Breast cancer, stage 2, left ([place]) 03/27/2017  . Port-A-Cath in place 12/16/2016  . Genetic testing 12/06/2016  . Encounter for antineoplastic chemotherapy 11/25/2016  . Malignant neoplasm of lower-outer quadrant of left breast of female, estrogen receptor negative ([place]) 10/27/2016  . Breast lump on left side at 1 o'clock position 05/22/2015  . Migraine without aura or status migrainosus 05/29/2014  .  Hyperlipidemia LDL goal <100 10/22/2013  . Depression 09/09/2013  . Rotator cuff impingement syndrome of left shoulder 07/12/2013  . ADD (attention deficit disorder) 01/09/2013  . Anxiety 12/13/2012  . Insomnia 12/13/2012    Palliative Care Assessment & Plan   Patient Profile: 43 y.o. female  with past medical history of metastatic breast cancer admitted on 01/02/2019 with intractable back pain.  Palliative consulted for pain management.   Assessment: Patient Active Problem List   Diagnosis Date Noted  . Inadequate pain control 01/02/2019  . Acute embolism and thrombosis of unspecified deep veins of unspecified lower extremity ([place]) 12/26/2018  . Neutropenic fever ([place]) 12/26/2018  . Sorethroat 12/26/2018  . 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 Neck pain 12/09/2018  . Hypothyroidism 12/09/2018  . Goals of care, counseling/discussion 12/06/2018  . Metastatic breast cancer ([place]) 08/08/2018  . Bone metastases ([place]) 08/07/2018  . Pain from bone metastases ([place]) 08/07/2018  . Family history of breast cancer 04/02/2018  . Family history of prostate cancer 04/02/2018  . History of therapeutic radiation 10/16/2017  . Acquired absence of both breasts 04/03/2017  . Breast cancer, stage 2, left ([place]) 03/27/2017  . Port-A-Cath in place 12/16/2016  . Genetic testing 12/06/2016  . Encounter for antineoplastic chemotherapy 11/25/2016  . Malignant neoplasm of lower-outer quadrant of left breast of female, estrogen receptor negative ([place]) 10/27/2016  . Breast lump on left side at 1 o'clock position 05/22/2015  . Migraine without aura or status migrainosus 05/29/2014  . Hyperlipidemia LDL goal <100 10/22/2013  . Depression 09/09/2013  . Rotator cuff impingement syndrome of left shoulder 07/12/2013  . ADD (attention deficit disorder) 01/09/2013  . Anxiety 12/13/2012  . Insomnia 12/13/2012    Recommendations/Plan: - Pain: bone pain with somatic as well as neuropathic components.  Plan for continuation of steroids.  In the last 12 hours, her needs  seem to have come down again.  Continue MS Contin 60 BID.  Will wean off PCA.  Begin with dilaudid oral 4mg  every 3 hours as needed with a backup of dilaudid 1mg  to be given 45 minutes after oral medication if oral route is ineffective.  She generally responds well to radiation, so will favor higher doses of short acting medication for at least the next couple of weeks until we see how her pain responds to treatments.   - Constipation: Opioid related. Continue senna and miralax.   Goals of Care and Additional Recommendations:  Limitations on Scope of Treatment: Full Scope Treatment  Code Status:    Code Status Orders  (From admission, onward)         Start     Ordered   01/02/19 1209  Full code  Continuous     01/02/19 1208        Code Status History    Date Active Date Inactive Code Status Order ID Comments User Context   12/09/2018 2151 12/14/2018 2023 Full Code [id number]  Shela Leff, MD ED   03/27/2017 1311 03/29/2017 1520 Full Code [id number]  Rolm Bookbinder, MD Inpatient   Advance Care Planning Activity       Prognosis:   Unable to determine  Discharge Planning:  Home with [place] was discussed with patient  Thank you for allowing the Palliative Medicine Team to assist in the care of this patient.   Total Time 25 Prolonged Time Billed No      Greater than 50%  of this time was spent counseling and coordinating care related to the above assessment and plan.  Micheline Rough, MD  Please contact Palliative Medicine Team phone at [phone] for questions and concerns.

## 2019-01-06 ENCOUNTER — Telehealth: Payer: Self-pay | Admitting: Hospice and Palliative Medicine

## 2019-01-06 DIAGNOSIS — G893 Neoplasm related pain (acute) (chronic): Secondary | ICD-10-CM

## 2019-01-06 DIAGNOSIS — C50919 Malignant neoplasm of unspecified site of unspecified female breast: Secondary | ICD-10-CM

## 2019-01-06 DIAGNOSIS — Z515 Encounter for palliative care: Secondary | ICD-10-CM

## 2019-01-06 DIAGNOSIS — C7951 Secondary malignant neoplasm of bone: Secondary | ICD-10-CM

## 2019-01-06 MED ORDER — MORPHINE SULFATE ER 60 MG PO TBCR: 60.0000 mg | EXTENDED_RELEASE_TABLET | Freq: Two times a day (BID) | ORAL | 0 refills | Status: DC

## 2019-01-06 MED ORDER — HYDROMORPHONE HCL 4 MG PO TABS: 4.0000 mg | ORAL_TABLET | ORAL | 0 refills | Status: DC | PRN

## 2019-01-06 MED ORDER — HEPARIN SOD (PORK) LOCK FLUSH 100 UNIT/ML IV SOLN
500.0000 [IU] | INTRAVENOUS | Status: DC | PRN
  Filled 2019-01-06: qty 5

## 2019-01-06 MED ORDER — HYDROMORPHONE HCL 4 MG PO TABS: 4.0000 mg | ORAL_TABLET | ORAL | 0 refills | Status: AC | PRN

## 2019-01-06 MED ORDER — DEXAMETHASONE 4 MG PO TABS: 4.0000 mg | ORAL_TABLET | Freq: Every day | ORAL | 0 refills | Status: DC

## 2019-01-06 MED ORDER — HEPARIN SOD (PORK) LOCK FLUSH 100 UNIT/ML IV SOLN: 500.0000 [IU] | INTRAVENOUS | Status: DC

## 2019-01-06 MED ORDER — PANTOPRAZOLE SODIUM 40 MG PO TBEC: 40.0000 mg | DELAYED_RELEASE_TABLET | Freq: Two times a day (BID) | ORAL | 0 refills | Status: DC

## 2019-01-06 NOTE — Progress Notes (Signed)
Daily Progress Note   Patient Name: Laura Anthony       Date: 01/06/2019 DOB: 1976-01-31  Age: 43 y.o. MRN#: [id number] Attending Physician: Hosie Poisson, MD Primary Care Physician: [name], MD Admit Date: 01/02/2019  Reason for Consultation/Follow-up: Pain control  Subjective: I saw and examined Ms. Herms today.  Her husband is at the bedside as well.  Discussed her pain regimen and she reports pain remains controlled on oral medication.  She would like to be discharged home today.  Still with constipation. Discussed home bowel regimen.  Length of Stay: 4  Current Medications: Scheduled Meds:  . apixaban  5 mg Oral BID  . Chlorhexidine Gluconate Cloth  6 each Topical Daily  . cyclobenzaprine  7.5 mg Oral TID  . dexamethasone (DECADRON) injection  4 mg Intravenous Q12H  . FLUoxetine  40 mg Oral BH-q7a  . gabapentin  300 mg Oral QPM  . heparin lock flush  500 Units Intracatheter Q30 days  . levothyroxine  88 mcg Oral Q0600  . lisdexamfetamine  30 mg Oral q morning - 10a  . morphine  60 mg Oral Q12H  . pantoprazole  40 mg Oral Q0600  . polyethylene glycol  17 g Oral Daily  . senna-docusate  2 tablet Oral BID    Continuous Infusions: . sodium chloride 100 mL/hr at 01/06/19 0621  . sodium chloride      PRN Meds: alum & mag hydroxide-simeth, heparin lock flush **AND** heparin lock flush, HYDROmorphone (DILAUDID) injection, HYDROmorphone, magnesium hydroxide, prochlorperazine  Physical Exam         General: Alert, awake, in no distress.   HEENT: No bruits, no goiter, no JVD Heart: Regular rate and rhythm. No murmur appreciated. Lungs: Good air movement, clear Abdomen: Soft, nontender, nondistended, positive bowel sounds.  Ext: No significant edema  Skin: Warm and dry Neuro: Grossly intact, nonfocal.  Vital Signs: BP 103/72 (BP Location: Right Arm)   Pulse (!) 54   Temp 98.4 F (36.9 C) (Oral)   Resp 16   Ht 5\' 7"  (1.702 m)   Wt 80.2 kg   SpO2 98%   BMI 27.69 kg/m  SpO2: SpO2: 98 % O2 Device: O2 Device: Nasal Cannula O2 Flow Rate: O2 Flow Rate (L/min): 1 L/min  Intake/output summary:   Intake/Output Summary (Last 24  hours) at 01/06/2019 1226 Last data filed at 01/06/2019 [id number] Gross per 24 hour  Intake 480 ml  Output 1150 ml  Net -670 ml   LBM: Last BM Date: 01/04/19 Baseline Weight: Weight: 80.2 kg Most recent weight: Weight: 80.2 kg       Palliative Assessment/Data:      Patient Active Problem List   Diagnosis Date Noted  . Inadequate pain control 01/02/2019  . Acute embolism and thrombosis of unspecified deep veins of unspecified lower extremity ([place]) 12/26/2018  . Neutropenic fever ([place]) 12/26/2018  . Sorethroat 12/26/2018  . 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 Neck pain 12/09/2018  . Hypothyroidism 12/09/2018  . Goals of care, counseling/discussion 12/06/2018  . Metastatic breast cancer ([place]) 08/08/2018  . Bone metastases ([place]) 08/07/2018  . Pain from bone metastases ([place]) 08/07/2018  . Family history of breast cancer 04/02/2018  . Family history of prostate cancer 04/02/2018  . History of therapeutic radiation 10/16/2017  . Acquired absence of both breasts 04/03/2017  . Breast cancer, stage 2, left ([place]) 03/27/2017  . Port-A-Cath in place 12/16/2016  . Genetic testing 12/06/2016  . Encounter for antineoplastic chemotherapy 11/25/2016  . Malignant neoplasm of lower-outer quadrant of left breast of female, estrogen receptor negative ([place]) 10/27/2016  . Breast lump on left side at 1 o'clock position 05/22/2015  . Migraine without aura or status migrainosus 05/29/2014  . Hyperlipidemia LDL goal <100 10/22/2013  .  Depression 09/09/2013  . Rotator cuff impingement syndrome of left shoulder 07/12/2013  . ADD (attention deficit disorder) 01/09/2013  . Anxiety 12/13/2012  . Insomnia 12/13/2012    Palliative Care Assessment & Plan   Patient Profile: 43 y.o. female  with past medical history of metastatic breast cancer admitted on 01/02/2019 with intractable back pain.  Palliative consulted for pain management.   Assessment: Patient Active Problem List   Diagnosis Date Noted  . Inadequate pain control 01/02/2019  . Acute embolism and thrombosis of unspecified deep veins of unspecified lower extremity ([place]) 12/26/2018  . Neutropenic fever ([place]) 12/26/2018  . Sorethroat 12/26/2018  . 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 Neck pain 12/09/2018  . Hypothyroidism 12/09/2018  . Goals of care, counseling/discussion 12/06/2018  . Metastatic breast cancer ([place]) 08/08/2018  . Bone metastases ([place]) 08/07/2018  . Pain from bone metastases ([place]) 08/07/2018  . Family history of breast cancer 04/02/2018  . Family history of prostate cancer 04/02/2018  . History of therapeutic radiation 10/16/2017  . Acquired absence of both breasts 04/03/2017  . Breast cancer, stage 2, left ([place]) 03/27/2017  . Port-A-Cath in place 12/16/2016  . Genetic testing 12/06/2016  . Encounter for antineoplastic chemotherapy 11/25/2016  . Malignant neoplasm of lower-outer quadrant of left breast of female, estrogen receptor negative ([place]) 10/27/2016  . Breast lump on left side at 1 o'clock position 05/22/2015  . Migraine without aura or status migrainosus 05/29/2014  . Hyperlipidemia LDL goal <100 10/22/2013  . Depression 09/09/2013  . Rotator cuff impingement syndrome of left shoulder 07/12/2013  . ADD (attention deficit disorder) 01/09/2013  . Anxiety 12/13/2012  . Insomnia 12/13/2012   Recommendations/Plan: - Pain: bone pain with somatic as well  as neuropathic components.  Plan for continuation of steroids, MS Contin 60 BID, and dilaudid oral 4mg  every 3 hours as needed on discharge.  She generally responds well to  radiation, so will favor higher doses of short acting medication for at least the next couple of weeks until we see how her pain responds to treatments.   - Constipation: Opioid related. Continue senna 2 tabs BID and increase miralax to BID. - Agree with plan for discharge today.   Goals of Care and Additional Recommendations:  Limitations on Scope of Treatment: Full Scope Treatment  Code Status:    Code Status Orders  (From admission, onward)         Start     Ordered   01/02/19 1209  Full code  Continuous     01/02/19 1208        Code Status History    Date Active Date Inactive Code Status Order ID Comments User Context   12/09/2018 2151 12/14/2018 2023 Full Code [id number]  Shela Leff, MD ED   03/27/2017 1311 03/29/2017 1520 Full Code [id number]  Rolm Bookbinder, MD Inpatient   Advance Care Planning Activity       Prognosis:   Unable to determine  Discharge Planning:  Home with [place] was discussed with patient and her husband  Thank you for allowing the Palliative Medicine Team to assist in the care of this patient.   Total Time 25 Prolonged Time Billed No      Greater than 50%  of this time was spent counseling and coordinating care related to the above assessment and plan.  Micheline Rough, MD  Please contact Palliative Medicine Team phone at [phone] for questions and concerns.

## 2019-01-06 NOTE — Progress Notes (Signed)
SATURATION QUALIFICATIONS: (This note is used to comply with regulatory documentation for home oxygen)  Patient Saturations on Room Air at Rest =97*%  Patient Saturations on Room Air while Ambulating = 98%  Patient Saturations on 2 Liters of oxygen while Ambulating =100%  Please briefly explain why patient needs home oxygen: 

## 2019-01-06 NOTE — Telephone Encounter (Signed)
Received call that Laura Anthony was unable to pick up hydromorphone at original pharmacy as medication is not available there today.    New script sent to [facility] in [place] per her request.

## 2019-01-06 NOTE — Progress Notes (Signed)
Patient called to say [facility] didn't have Hydromorphone, but [facility] across the street did. Dr. Karleen Hampshire CONTACTED [facility]. Because it has been more than 2 hours since discharge, Dr. Karleen Hampshire states she can't call in to [facility]. She states she will figure something out.

## 2019-01-07 ENCOUNTER — Other Ambulatory Visit: Payer: 59

## 2019-01-07 ENCOUNTER — Ambulatory Visit
Admission: RE | Admit: 2019-01-07 | Discharge: 2019-01-07 | Disposition: A | Discharge status: Home / Self Care | Payer: 59 | Source: Ambulatory Visit | Attending: Radiation Oncology | Admitting: Radiation Oncology

## 2019-01-07 ENCOUNTER — Other Ambulatory Visit: Payer: Self-pay | Admitting: *Deleted

## 2019-01-07 ENCOUNTER — Other Ambulatory Visit: Payer: Self-pay

## 2019-01-07 DIAGNOSIS — Z510 Encounter for antineoplastic radiation therapy: Secondary | ICD-10-CM | POA: Diagnosis not present

## 2019-01-07 DIAGNOSIS — Z171 Estrogen receptor negative status [ER-]: Secondary | ICD-10-CM | POA: Diagnosis not present

## 2019-01-07 DIAGNOSIS — C7951 Secondary malignant neoplasm of bone: Secondary | ICD-10-CM | POA: Diagnosis present

## 2019-01-07 DIAGNOSIS — C50512 Malignant neoplasm of lower-outer quadrant of left female breast: Secondary | ICD-10-CM | POA: Diagnosis not present

## 2019-01-07 DIAGNOSIS — R5081 Fever presenting with conditions classified elsewhere: Secondary | ICD-10-CM | POA: Diagnosis present

## 2019-01-07 DIAGNOSIS — D709 Neutropenia, unspecified: Secondary | ICD-10-CM | POA: Diagnosis present

## 2019-01-07 MED ORDER — FLUOXETINE HCL 20 MG PO CAPS: 20.0000 mg | ORAL_CAPSULE | Freq: Every day | ORAL | 0 refills | Status: DC

## 2019-01-07 NOTE — Progress Notes (Signed)
Pt reports increase in depression and anxiety due to recent diagnosis.  Per MD increase dosage of Prozac to 60 mg daily.

## 2019-01-07 NOTE — Discharge Summary (Signed)
Physician Discharge Summary  Laura Anthony [id number] DOB: [birth year] DOA: 01/02/2019  PCP: [name], MD  Admit date: 01/02/2019 Discharge date: 01/06/2019  Admitted From: Home.  Disposition:  Home.   Recommendations for Outpatient Follow-up:  1. Follow up with PCP in 1-2 weeks 2. Please obtain BMP/CBC in one week Please follow up with radiation oncology in am.  Please follow up with oncology as recommended on Thursday.   Discharge Condition:stable.  CODE STATUS:full code.  Diet recommendation: Heart Healthy   Brief/Interim Summary: 43 year old lady with prior h/o metastatic breast cancer with bone mets , right upper extremity DVT on Eliquis, hyperlipidemia, right shoulder rotator cuff impingement syndrome, presents to ED for worsening back pain. She follows up with Dr Lindi Adie for her metastatic breast cancer.    Discharge Diagnoses:  Active Problems:   Inadequate pain control  Intractable Pain secondary to Bone mets from metastatic breast cancer:  CT abd and pelvis on 01/01/19 shows Multiple small bone metastases which are progressed since the PET-CT of 08/02/2018 but essentially unchanged since the pelvic CT of 12/11/2018 and chest CT of 12/09/2018. Healing fracture of the posterolateral aspect of the right eighth rib. It was followed up by a MRI  Thoracic spine , showing Findings consistent with metastatic disease in the T12 and T8 vertebral bodies  Negative for fracture. Negative for central canal or foraminal stenosis. No epidural Tumor. Possible radiation treatment to the T SPINE , pt reports that her bone pain improves with radiation treatments.  Appreciate palliative input for adequate pain control.  transitioned to oral regimen and was able to tolerate without any issues.  She was discharged on increased MS contin dose and oral dilaudid for breakthrough pain.   Constipation;  Resume senna, miralax.  Metastatic breast cancer : Further management as  per oncology.  Transition to oral decadron on discharge.    Hypothyroidism:  Resume synthroid.     Right upper extremity DVT.  Resume Eliquis   Anemia of chronic disease  Hemoglobin stable between 9-10   Mild thrombocytopenia ; Probably from chemo, recommend checking counts on thursday at oncology office.  So far no signs of bleeding.    Discharge Instructions  Discharge Instructions    Diet - low sodium heart healthy   Complete by: As directed      Allergies as of 01/06/2019      Reactions   Sumatriptan Other (See Comments)   Numbness to face   Statins Other (See Comments)   Leg pain   Tape Rash, Other (See Comments)   Rash from dressing over port-a-cath      Medication List    STOP taking these medications   levofloxacin 750 MG tablet Commonly known as: LEVAQUIN   Oxycodone HCl 10 MG Tabs   predniSONE 10 MG tablet Commonly known as: DELTASONE     TAKE these medications   dexamethasone 4 MG tablet Commonly known as: DECADRON Take 1 tablet (4 mg total) by mouth daily.   Eliquis 5 MG Tabs tablet Generic drug: apixaban Take 5 mg by mouth 2 (two) times daily.   FLUoxetine 40 MG capsule Commonly known as: PROZAC Take 40 mg by mouth every morning.   gabapentin 300 MG capsule Commonly known as: NEURONTIN Take 300 mg by mouth every evening.   levothyroxine 88 MCG tablet Commonly known as: SYNTHROID TAKE 1 TABLET (88 MCG TOTAL) BY MOUTH DAILY BEFORE BREAKFAST.   lidocaine 2 % solution Commonly known as: XYLOCAINE Use as directed  10 mLs in the mouth or throat every 6 (six) hours as needed for mouth pain.   lidocaine-prilocaine cream Commonly known as: EMLA APPLY 1 APPLICATION TOPICALLY AS NEEDED. What changed: reasons to take this   lisdexamfetamine 30 MG capsule Commonly known as: VYVANSE Take 30 mg by mouth every morning.   morphine 60 MG 12 hr tablet Commonly known as: MS CONTIN Take 1 tablet (60 mg total) by mouth every 12  (twelve) hours. What changed:   medication strength  how much to take   ondansetron 8 MG tablet Commonly known as: Zofran Take 1 tablet (8 mg total) by mouth 2 (two) times daily as needed (Nausea or vomiting).   pantoprazole 40 MG tablet Commonly known as: PROTONIX Take 1 tablet (40 mg total) by mouth 2 (two) times daily.   polyethylene glycol 17 g packet Commonly known as: MIRALAX / GLYCOLAX Take 17 g by mouth daily as needed for mild constipation or moderate constipation.   prochlorperazine 10 MG tablet Commonly known as: COMPAZINE Take 1 tablet (10 mg total) by mouth every 6 (six) hours as needed (Nausea or vomiting).   rizatriptan 10 MG tablet Commonly known as: MAXALT Take 10 mg by mouth daily as needed for migraine (and may repeat once in 2 hours, if no relief).   senna-docusate 8.6-50 MG tablet Commonly known as: Senokot-S Take 1 tablet by mouth 2 (two) times daily. What changed:   when to take this  reasons to take this   spironolactone 100 MG tablet Commonly known as: ALDACTONE Take 100 mg by mouth daily.   sucralfate 1 g tablet Commonly known as: CARAFATE Take 1 tablet by mouth 4 (four) times daily as needed (for indigestion). Crush and mix with 1oz of water and take before meals and at bedtime   traZODone 50 MG tablet Commonly known as: DESYREL Take 1 tablet (50 mg total) by mouth at bedtime as needed for sleep. What changed: when to take this      Follow-up Information    [name], MD. Schedule an appointment as soon as possible for a visit in 1 week(s).   Specialty: Family Medicine Contact information: [address] [place] [address] [phone]          Allergies  Allergen Reactions  . Sumatriptan Other (See Comments)    Numbness to face   . Statins Other (See Comments)    Leg pain  . Tape Rash and Other (See Comments)    Rash from dressing over port-a-cath     Consultations:  Oncology  palliative care.     Procedures/Studies: Ct Soft Tissue Neck W Contrast  Result Date: 12/09/2018 CLINICAL DATA:  43 year old female with metastatic breast cancer and increasing neck pain and swelling. Currently on chemotherapy. Query as feces syndrome. EXAM: CT NECK WITH CONTRAST TECHNIQUE: Multidetector CT imaging of the neck was performed using the standard protocol following the bolus administration of intravenous contrast. CONTRAST:  146mL OMNIPAQUE IOHEXOL 300 MG/ML SOLN in conjunction with contrast enhanced imaging of the chest reported separately. COMPARISON:  Chest CT today reported separately. Brain MRI 08/02/2018. FINDINGS: Pharynx and larynx: The glottis is closed. Laryngeal and pharyngeal soft tissue contours are within normal limits. Negative parapharyngeal spaces. Negative retropharyngeal space. Salivary glands: Negative sublingual space. Submandibular glands and parotid glands are within normal limits. Thyroid: Diminutive, negative. Lymph nodes: No cervical lymphadenopathy. Diminutive bilateral cervical nodes. Vascular: The bilateral internal jugular veins are patent. The left IJ is dominant but there is  a right IJ approach porta cath in place. The visible bilateral subclavian veins are patent. 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 See also chest CT findings today reported separately. In the neck and at the skull base the major vascular structures are patent. Limited intracranial: Negative. Visualized orbits: Negative. Mastoids and visualized paranasal sinuses: Clear. Skeleton: Lytic metastasis in the C3 vertebral body is eccentric to the right and breaks through the posterior cortex (series 14, image 49 and sagittal image 48. No obvious epidural extension. There are patent epidural veins overlying the lesion. No other osseous metastasis identified in the neck. Upper chest: Reported separately today.  IMPRESSION: 1. Patent major vascular structures in the neck and at the skull base. 2. 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 The See CT Chest today reported separately. 3. Lytic C3 vertebral body metastasis which has broken through the posterior cortex, but no epidural tumor is evident by CT. 4. No other metastatic disease identified in the neck. Electronically Signed   By: Genevie Ann M.D.   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 CONTRAST:  135mL OMNIPAQUE IOHEXOL 300 MG/ML  SOLN COMPARISON:  PET-CT, 08/02/2018 FINDINGS: Cardiovascular: Interval placement of a right chest port catheter. There is new narrowing of the right brachiocephalic vein about the port catheter tubing (series 9, image 46) with new prominent small internal mammary and chest wall venous collaterals about the chest. Normal heart size. No pericardial effusion. Mediastinum/Nodes: No enlarged mediastinal, hilar, or axillary lymph nodes. Thyroid gland, trachea, and esophagus demonstrate no significant findings. Lungs/Pleura: There are numerous bilateral pulmonary nodules of varying sizes, a number of dominant nodule seen on initial staging examination dated 08/02/2018 not significantly changed in size, however with numerous new small pulmonary nodules throughout the lungs, for example a 5 mm nodule in the left lower lobe (series 11, image 86) and adjacent 2-3 mm nodules of the right lower lobe (series 11, image 113). No pleural effusion or pneumothorax. Upper Abdomen: No acute abnormality. Hepatic steatosis. Unchanged small left adrenal nodule, partially imaged. Musculoskeletal: Status post bilateral  mastectomy and implant reconstruction. IMPRESSION: 1. Interval placement of a right chest port catheter. There is new narrowing of the right brachiocephalic vein about the port catheter tubing (series 9, image 46) with new prominent small internal mammary and chest wall venous collaterals about the chest. 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 The superior vena cava itself is widely patent to the cavoatrial junction. A specifically tailored multiphasic CT central venogram may be helpful to further assess functional degree of stenosis and diagnostic/therapeutic interventional venogram can be considered. 2. There are numerous bilateral pulmonary nodules of varying sizes, a number of dominant nodule seen on initial staging examination dated 08/02/2018 not significantly changed in size, however with numerous new small pulmonary nodules throughout the lungs, for example a 5 mm nodule in the left lower lobe (series 11, image 86) and adjacent 2-3 mm nodules of the right lower lobe (series 11, image 113). Findings are consistent with worsened pulmonary metastatic disease. Electronically Signed   By: Eddie Candle M.D.   On:  12/09/2018 19:24   Ct Pelvis Wo Contrast  Result Date: 12/11/2018 CLINICAL DATA:  Abnormal x-ray. EXAM: CT PELVIS WITHOUT CONTRAST TECHNIQUE: Multidetector CT imaging of the pelvis was performed following the standard protocol without intravenous contrast. COMPARISON:  December 10, 2018. FINDINGS: Urinary Tract:  No abnormality visualized. Bowel:  Unremarkable visualized pelvic bowel loops. Vascular/Lymphatic: No pathologically enlarged lymph nodes. No significant vascular abnormality seen. Reproductive: Uterus is unremarkable. No adnexal abnormality is noted. Other:  None. Musculoskeletal: Rounded lucency is noted in right inferior pubic ramus which measures 2 cm in length concerning for lytic lesion. There is no definite evidence of fracture. Also noted is bilobed  lucency in the sacrum that measures 2.4 cm concerning for metastatic lesion. IMPRESSION: 2 cm rounded lucency seen in right inferior pubic ramus concerning for lytic lesion or metastatic lesion. Also noted is bilobed lucency in the sacrum measuring 2.4 cm, also concerning for metastatic disease. Bone scan may be performed for further evaluation. Electronically Signed   By: Marijo Conception M.D.   On: 12/11/2018 12:36   Mr Cervical Spine Wo Contrast  Result Date: 12/10/2018 CLINICAL DATA:  Onset severe right neck pain 2-3 days ago. History of metastatic breast cancer. EXAM: MRI CERVICAL SPINE WITHOUT CONTRAST TECHNIQUE: Multiplanar, multisequence MR imaging of the cervical spine was performed. No intravenous contrast was administered. COMPARISON:  None. FINDINGS: Alignment: Maintained. Vertebrae: There is a lesion in the right side of the C3 vertebral body measuring approximately 1.3 cm AP x 1.3 cm transverse x 1.2 cm craniocaudal highly suspicious for a metastatic deposit. T2 hyperintensity is also seen in the left transverse processes of C7 and T1 worrisome for metastatic disease. No fracture. Cord: Normal signal throughout. Posterior Fossa, vertebral arteries, paraspinal tissues: Negative. Disc levels: C2-3: Mild disc bulge without stenosis. C3-4: The posterior margin of C3 on the right is mildly expanded by tumor deposit but the central canal and foramina appear open. No disc bulge or protrusion. C4-5: Minimal disc bulge without stenosis. C5-6: There is a shallow disc bulge and some uncovertebral disease. No stenosis. C6-7: Negative. C7-T1: Negative. IMPRESSION: Findings most consistent with metastatic disease in the C3 vertebral body and left transverse processes of C7 and T1. The posterior margin of C3 is mildly expanded but no central canal or foraminal stenosis is identified. Postcontrast imaging to evaluate for epidural tumor extension is recommended. Whole-body bone scan could be used to evaluate for  additional sites of metastatic disease. These results will be called to the ordering clinician or representative by the Radiologist Assistant, and communication documented in the PACS or zVision Dashboard. Electronically Signed   By: Inge Rise M.D.   On: 12/10/2018 05:38   Mr Cervical Spine W Wo Contrast  Result Date: 12/11/2018 CLINICAL DATA:  Abnormal noncontrast cervical spine study EXAM: MRI CERVICAL SPINE WITHOUT AND WITH CONTRAST TECHNIQUE: Multiplanar and multiecho pulse sequences of the cervical spine, to include the craniocervical junction and cervicothoracic junction, were obtained without and with intravenous contrast. CONTRAST:  74mL GADAVIST GADOBUTROL 1 MMOL/ML IV SOLN COMPARISON:  Cervical spine MRI December 10, 2018 FINDINGS: Alignment: Stable. Vertebrae: Stable vertebral body heights. Abnormal STIR hyperintensity enhancement is again identified within the C3 vertebral body eccentric to the right. There is mild right ventral epidural extension partially effacing the subarachnoid space. Previously questioned T2 hyperintensity involving the left transverse processes of C7 and T1 cannot be definitively characterized as metastatic disease as this area is not covered on STIR or postcontrast imaging. Therefore, this could reflect  fat signal rather than edema. Cord: No abnormal cord signal. Posterior Fossa, vertebral arteries, paraspinal tissues: Unremarkable. Disc levels: No change since 1 day prior. No significant degenerative stenosis. IMPRESSION: Metastatic lesion at C3 with mild right ventral epidural extension partially effacing the subarachnoid space. No cord compression. Previously questioned left C7 and T1 transverse process lesions cannot be definitively characterized metastases as this area incompletely included on this study. Electronically Signed   By: Macy Mis M.D.   On: 12/11/2018 16:12   Mr Thoracic Spine W Wo Contrast  Result Date: 01/03/2019 CLINICAL DATA:  Worsening  thoracic spine pain in a patient with metastatic breast cancer. EXAM: MRI THORACIC WITHOUT AND WITH CONTRAST TECHNIQUE: Multiplanar and multiecho pulse sequences of the thoracic spine were obtained without and with intravenous contrast. CONTRAST:  8 mL GADAVIST IV COMPARISON:  CT chest 12/09/2018. PET CT scan 08/02/2018. FINDINGS: MRI THORACIC SPINE FINDINGS Alignment:  Normal. Vertebrae: There is no fracture. An enhancing lesion in the central aspect of T12 is consistent with a metastatic deposit measuring approximately 1.2 cm AP x 1.1 cm craniocaudal x 1.7 cm transverse. A 0.5 cm in diameter enhancing lesion is also seen in the right side of the T8 vertebral body. Benign hemangioma in T8 is noted. Increased T1 and T2 signal in the majority of T10, throughout T11 and the superior aspect of T12 is most consistent with prior radiation therapy. Cord:  Normal signal throughout. No epidural tumor. Paraspinal and other soft tissues: Dependent atelectasis noted. 0.4 cm in diameter T2 hyperintense focus in the posterior arc of the right third rib may also represent metastatic disease Disc levels: No disc bulge or protrusion. The central canal and foramina are widely patent at all levels. IMPRESSION: 1. Findings consistent with metastatic disease in the T12 and T8 vertebral bodies as described above. Negative for fracture. 2. Negative for central canal or foraminal stenosis. No epidural tumor. Electronically Signed   By: Inge Rise M.D.   On: 01/03/2019 15:42   Ct Abdomen Pelvis W Contrast  Result Date: 01/01/2019 CLINICAL DATA:  Back pain. Metastatic breast cancer. Left flank pain. EXAM: CT ABDOMEN AND PELVIS WITH CONTRAST TECHNIQUE: Multidetector CT imaging of the abdomen and pelvis was performed using the standard protocol following bolus administration of intravenous contrast. CONTRAST:  175mL OMNIPAQUE IOHEXOL 300 MG/ML  SOLN COMPARISON:  CT scan of the pelvis dated 12/11/2018 and PET-CT dated 08/02/2018 and  chest CT dated 12/09/2018 no acute abnormalities. FINDINGS: Lower chest: There is a 18 x 13 mm metastasis in right middle lobe. This was visible on the prior study of 12/09/2018 when it measured 19 x 15 mm. Hepatobiliary: No focal liver abnormality is seen. No gallstones, gallbladder wall thickening, or biliary dilatation. Pancreas: Unremarkable. No pancreatic ductal dilatation or surrounding inflammatory changes. Spleen: Normal in size without focal abnormality. Adrenals/Urinary Tract: Stable 9 mm low-density nodule in the left adrenal gland. Right adrenal gland is normal. Kidneys are normal. No hydronephrosis. Bladder is normal. Stomach/Bowel: Stomach is within normal limits. Appendix appears normal. No evidence of bowel wall thickening, distention, or inflammatory changes. Vascular/Lymphatic: No significant vascular findings are present. No enlarged abdominal or pelvic lymph nodes. Reproductive: Uterus and bilateral adnexa are unremarkable. Other: No abdominal wall hernia or abnormality. No abdominopelvic ascites. Musculoskeletal: The patient has multiple small bone metastases which are progressed since the PET-CT of 08/02/2018. There is a healing fracture of the posterolateral aspect of the right eighth rib. There subtle lesions in the lateral aspect of the T9  vertebral body and in the central portion of the T12 vertebral body. There is a subtle lesion in the left side of the L3 vertebral body as well as a tiny lesion in the left transverse process of L4 and a 24 x 14 mm lesion in the left side of the S1 segment of the sacrum as well as a 10 mm lesion in the left ilium adjacent to the left SI joint. There is a lytic lesion in the right inferior pubic ramus. 9 mm lesion in the right sacral ala. No discrete muscle abnormality. No visible tumor in the spinal canal. IMPRESSION: 1. No acute abnormality of the abdomen or pelvis. 2. No significant change in the metastasis in the right middle lobe. 3. Multiple small  bone metastases which are progressed since the PET-CT of 08/02/2018 but essentially unchanged since the pelvic CT of 12/11/2018 and chest CT of 12/09/2018. 4. Healing fracture of the posterolateral aspect of the right eighth rib. Electronically Signed   By: Lorriane Shire M.D.   On: 01/01/2019 15:04   Dg Hip Unilat With Pelvis 2-3 Views Right  Result Date: 12/10/2018 CLINICAL DATA:  Right hip pain EXAM: DG HIP (WITH OR WITHOUT PELVIS) 2-3V RIGHT COMPARISON:  None. FINDINGS: No fracture or dislocation at the right hip. There is a linear lucency of the right inferior pubic ramus without displacement. The visualized pelvic and left hip osseous structures are normal otherwise. IMPRESSION: Linear lucency of the right inferior pubic ramus may indicate a nondisplaced fracture (CT would be more conclusive). No fracture or dislocation of the right hip. Electronically Signed   By: Ulyses Jarred M.D.   On: 12/10/2018 19:27       Subjective:  Pain well controlled.  Discharge Exam: Vitals:   01/06/19 0611 01/06/19 0829  BP: 103/72   Pulse: (!) 54   Resp: 16   Temp: 98.4 F (36.9 C)   SpO2: 100% 98%   Vitals:   01/05/19 1420 01/05/19 1947 01/06/19 0611 01/06/19 0829  BP: 119/80 109/76 103/72   Pulse: 72 77 (!) 54   Resp: 12 16 16    Temp: 99.2 F (37.3 C) 98.5 F (36.9 C) 98.4 F (36.9 C)   TempSrc: Oral Oral Oral   SpO2: 98% 99% 100% 98%  Weight:      Height:        General: Pt is alert, awake, not in acute distress Cardiovascular: RRR, S1/S2 +, no rubs, no gallops Respiratory: CTA bilaterally, no wheezing, no rhonchi Abdominal: Soft, NT, ND, bowel sounds + Extremities: no edema, no cyanosis    The results of significant diagnostics from this hospitalization (including imaging, microbiology, ancillary and laboratory) are listed below for reference.     Microbiology: Recent Results (from the past 240 hour(s))  SARS CORONAVIRUS 2 (TAT 6-24 HRS) Nasopharyngeal Nasopharyngeal Swab      Status: None   Collection Time: 01/02/19 11:25 AM   Specimen: Nasopharyngeal Swab  Result Value Ref Range Status   SARS Coronavirus 2 NEGATIVE NEGATIVE Final    Comment: (NOTE) SARS-CoV-2 target nucleic acids are NOT DETECTED. The SARS-CoV-2 RNA is generally detectable in upper and lower respiratory specimens during the acute phase of infection. Negative results do not preclude SARS-CoV-2 infection, do not rule out co-infections with other pathogens, and should not be used as the sole basis for treatment or other patient management decisions. Negative results must be combined with clinical observations, patient history, and epidemiological information. The expected result is Negative. Fact Sheet for  Patients: SugarRoll.be Fact Sheet for Healthcare Providers: https://www.woods-mathews.com/ This test is not yet approved or cleared by the [place] FDA and  has been authorized for detection and/or diagnosis of SARS-CoV-2 by FDA under an Emergency Use Authorization (EUA). This EUA will remain  in effect (meaning this test can be used) for the duration of the COVID-19 declaration under Section 56 4(b)(1) of the Act, 21 U.S.C. section 360bbb-3(b)(1), unless the authorization is terminated or revoked sooner. Performed at [facility], [place] [address], [place], [place] [address]      Labs: BNP (last 3 results) No results for input(s): BNP in the last 8760 hours. Basic Metabolic Panel: Recent Labs  Lab 01/01/19 1129 01/02/19 0821 01/04/19 1649  NA 141 138 138  K 3.6 3.6 3.7  CL 100 101 102  CO2 30 28 26   GLUCOSE 78 144* 137*  BUN 13 17 15   CREATININE 0.85 1.00 0.65  CALCIUM 9.3 8.9 8.9   Liver Function Tests: Recent Labs  Lab 01/01/19 1129 01/04/19 1649  AST 51* 24  ALT 59* 30  ALKPHOS 104 89  BILITOT 0.2* 0.3  PROT 7.0 6.8  ALBUMIN 3.7 3.2*   No results for input(s): LIPASE, AMYLASE in the last 168 hours. No  results for input(s): AMMONIA in the last 168 hours. CBC: Recent Labs  Lab 01/01/19 1129 01/02/19 0821 01/04/19 1649  WBC 7.4 8.5 11.0*  NEUTROABS 3.3 5.6 9.7*  HGB 10.3* 10.1* 9.3*  HCT 31.9* 31.5* 28.8*  MCV 106.3* 108.6* 105.5*  PLT 156 141* 125*   Cardiac Enzymes: No results for input(s): CKTOTAL, CKMB, CKMBINDEX, TROPONINI in the last 168 hours. BNP: Invalid input(s): POCBNP CBG: No results for input(s): GLUCAP in the last 168 hours. D-Dimer No results for input(s): DDIMER in the last 72 hours. Hgb A1c No results for input(s): HGBA1C in the last 72 hours. Lipid Profile No results for input(s): CHOL, HDL, LDLCALC, TRIG, CHOLHDL, LDLDIRECT in the last 72 hours. Thyroid function studies No results for input(s): TSH, T4TOTAL, T3FREE, THYROIDAB in the last 72 hours.  Invalid input(s): FREET3 Anemia work up No results for input(s): VITAMINB12, FOLATE, FERRITIN, TIBC, IRON, RETICCTPCT in the last 72 hours. Urinalysis    Component Value Date/Time   COLORURINE YELLOW 01/01/2019 1223   APPEARANCEUR CLEAR 01/01/2019 1223   LABSPEC 1.028 01/01/2019 1223   PHURINE 5.0 01/01/2019 1223   GLUCOSEU NEGATIVE 01/01/2019 1223   HGBUR NEGATIVE 01/01/2019 1223   BILIRUBINUR NEGATIVE 01/01/2019 1223   KETONESUR 5 (A) 01/01/2019 1223   PROTEINUR 30 (A) 01/01/2019 1223   NITRITE NEGATIVE 01/01/2019 1223   LEUKOCYTESUR NEGATIVE 01/01/2019 1223   Sepsis Labs Invalid input(s): PROCALCITONIN,  WBC,  LACTICIDVEN Microbiology Recent Results (from the past 240 hour(s))  SARS CORONAVIRUS 2 (TAT 6-24 HRS) Nasopharyngeal Nasopharyngeal Swab     Status: None   Collection Time: 01/02/19 11:25 AM   Specimen: Nasopharyngeal Swab  Result Value Ref Range Status   SARS Coronavirus 2 NEGATIVE NEGATIVE Final    Comment: (NOTE) SARS-CoV-2 target nucleic acids are NOT DETECTED. The SARS-CoV-2 RNA is generally detectable in upper and lower respiratory specimens during the acute phase of infection.  Negative results do not preclude SARS-CoV-2 infection, do not rule out co-infections with other pathogens, and should not be used as the sole basis for treatment or other patient management decisions. Negative results must be combined with clinical observations, patient history, and epidemiological information. The expected result is Negative. Fact Sheet for Patients: SugarRoll.be Fact Sheet  for Healthcare Providers: https://www.woods-mathews.com/ This test is not yet approved or cleared by the [place] and  has been authorized for detection and/or diagnosis of SARS-CoV-2 by FDA under an Emergency Use Authorization (EUA). This EUA will remain  in effect (meaning this test can be used) for the duration of the COVID-19 declaration under Section 56 4(b)(1) of the Act, 21 U.S.C. section 360bbb-3(b)(1), unless the authorization is terminated or revoked sooner. Performed at [facility], [place] [address], [place], [place] [address]      Time coordinating discharge: 38 minutes  SIGNED:   Hosie Poisson, MD  [facility]   Addendum:  Pt called few hours after she was discharged that the [facility] did not have dilaud[facility], Dr Domingo Cocking was able to call in the dilaudid to be picked up  from [place].    Hosie Poisson, MD [phone]

## 2019-01-08 ENCOUNTER — Other Ambulatory Visit: Payer: Self-pay

## 2019-01-08 ENCOUNTER — Ambulatory Visit
Admission: RE | Admit: 2019-01-08 | Discharge: 2019-01-08 | Disposition: A | Discharge status: Home / Self Care | Payer: 59 | Source: Ambulatory Visit | Attending: Radiation Oncology | Admitting: Radiation Oncology

## 2019-01-08 ENCOUNTER — Other Ambulatory Visit: Payer: Self-pay | Admitting: Internal Medicine

## 2019-01-08 DIAGNOSIS — Z510 Encounter for antineoplastic radiation therapy: Secondary | ICD-10-CM | POA: Diagnosis not present

## 2019-01-08 DIAGNOSIS — Z7189 Other specified counseling: Secondary | ICD-10-CM

## 2019-01-08 DIAGNOSIS — Z515 Encounter for palliative care: Secondary | ICD-10-CM

## 2019-01-09 ENCOUNTER — Ambulatory Visit
Admission: RE | Admit: 2019-01-09 | Discharge: 2019-01-09 | Disposition: A | Discharge status: Home / Self Care | Payer: 59 | Source: Ambulatory Visit | Attending: Radiation Oncology | Admitting: Radiation Oncology

## 2019-01-09 ENCOUNTER — Other Ambulatory Visit: Payer: Self-pay

## 2019-01-09 DIAGNOSIS — Z510 Encounter for antineoplastic radiation therapy: Secondary | ICD-10-CM | POA: Diagnosis not present

## 2019-01-09 NOTE — Progress Notes (Signed)
Patient Care Team: [name], MD as PCP - General (Family Medicine) Mauro Kaufmann, RN as Oncology Nurse Navigator Rockwell Germany, RN as Oncology Nurse Navigator  DIAGNOSIS:    ICD-10-CM   1. Bone metastases (HCC)  C79.51   2. Malignant neoplasm of lower-outer quadrant of left breast of female, estrogen receptor negative ([place])  C50.512    Z17.1     SUMMARY OF ONCOLOGIC HISTORY: 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, estrogen receptor negative ([place])  10/20/2016 Mammogram   Mammogram and ultrasound of the left breast revealed 1.7 cm mass at 4:00 position, 6:30 position 5 x 4 x 4 mm mass, 6:00 position 5 cm nipple 7 x 6 x 11 mm, left axillary lymph node with thickened cortex, T1c N1 stage II a AJCC 8   10/24/2016 Initial Diagnosis   Left breast biopsy 6:30 position 3 cm from nipple: IDC grade 2, DCIS, ER 0%, PR 0%, Ki-67 15% HER-2 positive ratio 2.1; 4:00 position 3 cm from nipple: IDC grade 2, DCIS, ER 0%, PR 0%, Ki-67 35%, HER-2 positive ratio 2.02; left axillary lymph node biopsy positive   11/04/2016 - 02/17/2017 Neo-Adjuvant Chemotherapy   TCH Perjeta 6 cycles followed by Herceptin + Perjeta maintenance to be completed September 2019   11/30/2016 Genetic Testing   Negative genetic testing on the common hereditary cancer panel.  The Hereditary Gene Panel offered by [facility] includes sequencing and/or deletion duplication testing of the following 47 genes: APC, ATM, AXIN2, BARD1, BMPR1A, BRCA1, BRCA2, BRIP1, CDH1, CDK4, CDKN2A (p14ARF), CDKN2A (p16INK4a), CHEK2, CTNNA1, DICER1, EPCAM (Deletion/duplication testing only), GREM1 (promoter region deletion/duplication testing only), KIT, MEN1, MLH1, MSH2, MSH3, MSH6, MUTYH, NBN, NF1, NHTL1, PALB2, PDGFRA, PMS2, POLD1, POLE, PTEN, RAD50, RAD51C, RAD51D, SDHB, SDHC, SDHD, SMAD4, SMARCA4. STK11, TP53, TSC1, TSC2, and VHL.  The following genes were evaluated for sequence changes only: SDHA  and HOXB13 c.251G>A variant only. The report date is November 30, 2016.    03/27/2017 Surgery   Bilateral mastectomies: Left mastectomy: IDC grade 2 0.9 cm, nodes negative, right mastectomy benign, ER 0%, PR 0%, HER-2 positive ratio 2.6   05/08/2017 - 06/09/2017 Radiation Therapy   Adjuvant radiation therapy   10/23/2017 Miscellaneous   Neratinib discontinued after 4 weeks for severe diarrhea   07/25/2018 Relapse/Recurrence   MRI of right elbow showed bone lesion consistent with malignancy. PET scan showed bilateral pulmonary nodules and several lytic bone lesions compatible with metastatic disease. Brain MRI on 08/02/18 showed no evidence of metastatic disease.   08/02/2018 PET scan   Bilateral hypermetabolic lung nodules, LUL 1.3 cm with SUV 3.88, lingular nodule 1.4 cm SUV 3.7, central lingular nodule 1.2 cm SUV 9.76, right middle lobe nodule 1.5 cm SUV 9.9, lytic bone metastases inferior pubic ramus, sacrum, T12, right 11th rib.   08/08/2018 Procedure   Lung biopsy: metastatic carcinoma, HER-2 negative (0), ER/PR negative.   08/10/2018 -  Radiation Therapy   Palliative radiatio to the right humerus along the medial condyle   08/24/2018 - 09/05/2018 Radiation Therapy   Palliative radiation to the right 11th rib and right elbow   09/26/2018 - 12/04/2018 Chemotherapy   Carboplatin atezolizumab at [facility] with Dr. Janan Halter on [place] clinical trial stopped because of new T5 metastases (toxicities included myopathy required prednisone, immune mediated thyroiditis), right upper extremity DVT on apixaban   12/14/2018 -  Chemotherapy   The patient had eriBULin mesylate (HALAVEN) 2.7 mg in sodium  chloride 0.9 % 100 mL chemo infusion, 1.42 mg/m2 = 2.65 mg, Intravenous,  Once, 2 of 4 cycles Dose modification: 1.1 mg/m2 (original dose 1.4 mg/m2, Cycle 2, Reason: Dose not tolerated, Comment: neutropenic fever) Administration: 2.7 mg (12/14/2018), 2.7 mg (12/21/2018), 2 mg (01/03/2019)  for  chemotherapy treatment.    12/17/2018 -  Radiation Therapy   Palliative radiation to sternal, sacral & pelvic lesions and SRS for T3 and C7-T1 lesions.   Bone metastases ([place])  08/07/2018 Initial Diagnosis   Bone metastases ([place])   12/14/2018 -  Chemotherapy   The patient had pertuzumab (PERJETA) 420 mg in sodium chloride 0.9 % 250 mL chemo infusion, 420 mg (100 % of original dose 420 mg), Intravenous, Once, 1 of 6 cycles Dose modification: 420 mg (original dose 420 mg, Cycle 1, Reason: Provider Judgment) Administration: 420 mg (12/14/2018) trastuzumab-dkst (OGIVRI) 600 mg in sodium chloride 0.9 % 250 mL chemo infusion, 609 mg, Intravenous,  Once, 1 of 6 cycles Administration: 600 mg (12/14/2018)  for chemotherapy treatment.    12/17/2018 -  Radiation Therapy   Palliative radiation to sternal, sacral & pelvic lesions and SRS for T3 and C7-T1 lesions.     CHIEF COMPLIANT: Follow-up of metastatic breast cancer on Halaven with Herceptin and Perjeta  INTERVAL HISTORY: Laura Anthony is a 43 y.o. with above-mentioned history of metastatic breast cancer with metastases to the lungs and bone. She is currently on treatment with Halaven with Herceptin and Perjeta. She was admitted from 12/2-12/6 after presenting to the ED for intractable back pain. She presents to the clinic today for follow-up of her hospitalization and treatment.   REVIEW OF SYSTEMS:   Constitutional: Denies fevers, chills or abnormal weight loss Eyes: Denies blurriness of vision Ears, nose, mouth, throat, and face: Denies mucositis or sore throat Respiratory: Denies cough, dyspnea or wheezes Cardiovascular: Denies palpitation, chest discomfort Gastrointestinal: Denies nausea, heartburn or change in bowel habits Skin: Denies abnormal skin rashes Lymphatics: Denies new lymphadenopathy or easy bruising Neurological: Denies numbness, tingling or new weaknesses Behavioral/Psych: Mood is stable, no new changes   Extremities: No lower extremity edema Breast: denies any pain or lumps or nodules in either breasts All other systems were reviewed with the patient and are negative.  I have reviewed the past medical history, past surgical history, social history and family history with the patient and they are unchanged from previous note.  ALLERGIES:  is allergic to sumatriptan; statins; and tape.  MEDICATIONS:  Current Outpatient Medications  Medication Sig Dispense Refill  . dexamethasone (DECADRON) 4 MG tablet Take 1 tablet (4 mg total) by mouth daily. 5 tablet 0  . ELIQUIS 5 MG TABS tablet Take 5 mg by mouth 2 (two) times daily.    Marland Kitchen FLUoxetine (PROZAC) 20 MG capsule Take 1 capsule (20 mg total) by mouth daily. 90 capsule 0  . FLUoxetine (PROZAC) 40 MG capsule Take 40 mg by mouth every morning.     . gabapentin (NEURONTIN) 300 MG capsule Take 300 mg by mouth every evening.     Marland Kitchen HYDROmorphone (DILAUDID) 4 MG tablet Take 1 tablet (4 mg total) by mouth every 3 (three) hours as needed for up to 5 days (breakthrough pain). 40 tablet 0  . levothyroxine (SYNTHROID) 88 MCG tablet TAKE 1 TABLET (88 MCG TOTAL) BY MOUTH DAILY BEFORE BREAKFAST. 30 tablet 1  . lidocaine (XYLOCAINE) 2 % solution Use as directed 10 mLs in the mouth or throat every 6 (six) hours as needed for mouth  pain. 450 mL 1  . lidocaine-prilocaine (EMLA) cream APPLY 1 APPLICATION TOPICALLY AS NEEDED. (Patient taking differently: Apply 1 application topically as needed (for pain). ) 30 g 0  . lisdexamfetamine (VYVANSE) 30 MG capsule Take 30 mg by mouth every morning.     Marland Kitchen morphine (MS CONTIN) 60 MG 12 hr tablet Take 1 tablet (60 mg total) by mouth every 12 (twelve) hours.  0  . ondansetron (ZOFRAN) 8 MG tablet Take 1 tablet (8 mg total) by mouth 2 (two) times daily as needed (Nausea or vomiting). 30 tablet 1  . pantoprazole (PROTONIX) 40 MG tablet Take 1 tablet (40 mg total) by mouth 2 (two) times daily. 60 tablet 0  . polyethylene glycol  (MIRALAX / GLYCOLAX) 17 g packet Take 17 g by mouth daily as needed for mild constipation or moderate constipation. 14 each 0  . prochlorperazine (COMPAZINE) 10 MG tablet Take 1 tablet (10 mg total) by mouth every 6 (six) hours as needed (Nausea or vomiting). 30 tablet 1  . rizatriptan (MAXALT) 10 MG tablet Take 10 mg by mouth daily as needed for migraine (and may repeat once in 2 hours, if no relief).     Marland Kitchen senna-docusate (SENOKOT-S) 8.6-50 MG tablet Take 1 tablet by mouth 2 (two) times daily. (Patient taking differently: Take 1 tablet by mouth 2 (two) times daily as needed for moderate constipation. ) 60 tablet 0  . spironolactone (ALDACTONE) 100 MG tablet Take 100 mg by mouth daily.     . sucralfate (CARAFATE) 1 g tablet Take 1 tablet by mouth 4 (four) times daily as needed (for indigestion). Crush and mix with 1oz of water and take before meals and at bedtime    . traZODone (DESYREL) 50 MG tablet Take 1 tablet (50 mg total) by mouth at bedtime as needed for sleep. (Patient taking differently: Take 50 mg by mouth at bedtime. ) 90 tablet 1   No current facility-administered medications for this visit.    PHYSICAL EXAMINATION: ECOG PERFORMANCE STATUS: 1 - Symptomatic but completely ambulatory  Vitals:   01/10/19 0854  BP: 132/86  Pulse: (!) 101  Resp: 20  Temp: 97.8 F (36.6 C)  SpO2: 97%   Filed Weights   01/10/19 0854  Weight: 177 lb 12.8 oz (80.6 kg)    GENERAL: alert, no distress and comfortable SKIN: skin color, texture, turgor are normal, no rashes or significant lesions EYES: normal, Conjunctiva are pink and non-injected, sclera clear OROPHARYNX: no exudate, no erythema and lips, buccal mucosa, and tongue normal  NECK: supple, thyroid normal size, non-tender, without nodularity LYMPH: no palpable lymphadenopathy in the cervical, axillary or inguinal LUNGS: clear to auscultation and percussion with normal breathing effort HEART: regular rate & rhythm and no murmurs and no  lower extremity edema ABDOMEN: abdomen soft, non-tender and normal bowel sounds MUSCULOSKELETAL: no cyanosis of digits and no clubbing  NEURO: alert & oriented x 3 with fluent speech, no focal motor/sensory deficits EXTREMITIES: No lower extremity edema  LABORATORY DATA:  I have reviewed the data as listed CMP Latest Ref Rng & Units 01/04/2019 01/02/2019 01/01/2019  Glucose 70 - 99 mg/dL 137(H) 144(H) 78  BUN 6 - 20 mg/dL _0 Creatinine 0.44 - 1.00 mg/dL 0.65 1.00 0.85  Sodium 135 - 145 mmol/L 138 138 141  Potassium 3.5 - 5.1 mmol/L 3.7 3.6 3.6  Chloride 98 - 111 mmol/L 102 101 100  CO2 22 - 32 mmol/L _1 Calcium 8.9 -  10.3 mg/dL 8.9 8.9 9.3  Total Protein 6.5 - 8.1 g/dL 6.8 - 7.0  Total Bilirubin 0.3 - 1.2 mg/dL 0.3 - 0.2(L)  Alkaline Phos 38 - 126 U/L 89 - 104  AST 15 - 41 U/L 24 - 51(H)  ALT 0 - 44 U/L 30 - 59(H)    Lab Results  Component Value Date   WBC 5.5 01/10/2019   HGB 9.7 (L) 01/10/2019   HCT 28.1 (L) 01/10/2019   MCV 101.8 (H) 01/10/2019   PLT 124 (L) 01/10/2019   NEUTROABS PENDING 01/10/2019    ASSESSMENT & PLAN:  Malignant neoplasm of lower-outer quadrant of left breast of female, estrogen receptor negative (HCC) Mammogram and ultrasound of the left breast revealed 1.7 cm mass at 4:00 position, 6:30 position 5 x 4 x 4 mm mass, 6:00 position 5 cm nipple 7 x 6 x 11 mm, left axillary lymph node with thickened cortex, T1c N1 stage II a AJCC 8  10/24/2016: Left breast biopsy 6:30 position 3 cm from nipple: IDC grade 2, DCIS, ER 0%, PR 0%, Ki-67 15% HER-2 positive ratio 2.1; 4:00 position 3 cm from nipple: IDC grade 2, DCIS, ER 0%, PR 0%, Ki-67 35%, HER-2 positive ratio 2.02 Lymph node biopsy positive  Treatment Summary: 1. Neoadjuvant chemotherapy with TCHPcompleted 02/17/2017 this would be followed by Herceptinand Perjetamaintenance for 1 yearcompleted September 2019 2.Bilateral mastectomies 03/28/2016:Bilateral mastectomies: Left mastectomy: IDC  grade 2 0.9 cm, nodes negative, right mastectomy benign, ER 0%, PR 0%, HER-2 positive ratio 2.6 3.Adjuvant radiation4/08/2017 to 06/09/2017 4.Neratinib started 10/12/2017 discontinued due to diarrhea 5.  Elbow fracture: Due to metastatic disease, palliative radiation therapy 6.  Carboplatin atezolizumab at [facility] on a clinical trial [facility] 0[facility] stopped for progression 12/04/2018 ---------------------------------------------------------------------------------------------------------------- Lung nodule biopsy: Metastatic breast cancer triple negative  CT CAP 12/03/2018: Progression of lung nodules and bone metastases done at [facility]  Current treatment: Halaven days 1 and 8 every 3 weeks with Herceptin and Perjeta every 3 weeks, today cycle 2 day 8 Halaven Chemo toxicities: 1. Hospitalization:  November 2020: Neutropenic fever at [facility] 01/02/2019-01/06/2019: Hospitalization for intractable back pain, MRI thoracic spine 01/03/2019: T12 and T8 metastatic disease. CT abdomen and pelvis: 01/01/2019: No significant change in the right middle lobe metastases. Multiple bone metastases progressed. Healing fracture right eighth rib 2. Neutropenic fever: Halaven dose reduced  Palliative care is assisting with the patient's pain regimen. Return to clinic in 2 weeks for cycle 3. Our plan is to do scans after 3 cycles are complete.  Today patient receives Halaven along with Herceptin and Perjeta. She received day 1 of Halaven in the hospital.   Bone metastases: [place] See Return to clinic in 2 weeks for cycle 2    No orders of the defined types were placed in this encounter.  The patient has a good understanding of the overall plan. she agrees with it. she will call with any problems that may develop before the next visit here.  Nicholas Lose, MD 01/10/2019  Julious Oka Dorshimer, am acting as scribe for Dr. Nicholas Lose.  I have reviewed the above documentation for accuracy and  completeness, and I agree with the above.

## 2019-01-09 NOTE — Progress Notes (Addendum)
Dec 8th, 2020  [place] Consult Note Telephone: [phone]  Fax: [phone]   Due to the current COVID-19 infection/crises, the patient and family prefer, and have given their verbal consent for, a provider visit via telehealth, from my office. HIPPA policies of confidentially were discussed.  PATIENT NAME: Laura Anthony DOB: 21-Aug-1975 MRN: [id number] [address] [place], [place] [phone] [email or web address]  PRIMARY CARE PROVIDER:   [name], MD [address] [place],  [place] [address]  REFERRING PROVIDER:  Dr. Nicholas Lose (Oncology) Dr. Kyung Rudd (Radiation Oncology)  RESPONSIBLE PARTY:  (spouse) Audreyana Quale [facility], [phone]  ASSESSMENT / RECOMMENDATIONS:  1. Advance Care Planning: A. Directives: Full Code B. Goals of Care: Continue treatments to prolonging life; hopeful for a cure. To have the strength and stamina to enjoy her life and her family. To remain as upbeat and positive as can.  2. Symptom Management: A. Pain: Recent hospitalization for unmanaged back pain d/t bony metastasis. Discharged on MS Cont 60mg  bid, hydromorphone 4mg  q3hr prn, and oral steroids. Palliative XRT to that area has been initiated (Dr. Kyung Rudd). Since hospital discharge her pain (lower back into left flank) has been better managed on her MS Cont. She feels minimal side effects from her MS Cont, but when she takes the Dilaudid she feels "loopy" and so avoids. Though never pain free, feels pain is tolerable to the point where she can sleep, participate in family activities and preform her ADLs.   -we discussed that she could cut her hydromorphone in half; may help take the edge off of her break through pain with less mental side effects/fatigue. Also discussed that as patient takes the opioids, she'll build up a tolerance to those side effects.   B.  Opioid induced constipation:  Currently well managed with Senokot-S, 2 tabs bid, with occasional use of prn Miralax. We discussed she can titrate her Senokot up to 3 tabs bid. Could add Milk of Mag prn as well, in preference to the MiraLax.  3. Coping setting of life threating illness: She and her family members have tapped into counseling on /off. She feels comfortable in resourcing further counseling, as need arises. Patient discusses her diagnosis openly with her children, and patient herself utilizes online counseling services.  Patient is very busy managing her household, kid's schedules, and her own treatment demands; helps keep her mind occupied. Finds herself very easily fatigued, which she attributes to ongoing immunotherapy, XRT, physical and emotional demands, as well as battling pain. She's used to being active in her life, so her fatigue feels foreign to her. Her body image has changed; weight gain (25 lbs in 6 weeks) d/t steroids and "messed up thyroid". But she doesn't dwell on this as other demands and concerns supersede.   4. Cognitive / Functional status: A & O x 3. Her weight is 177lbs. At a height of 5'7" her BMI is 27.7kg/m2. She is independent in ADLs an IDL's, though increasingly fatigued and easily short winded. She worries that she is getting increasingly deconditioned. She has some mild skin changes/pruritus r/t XRT, well managed with products purchased from [email or web address]  -Will e-mail her some low impact limbering exercises; encouraged goal of doing these tid  5. Family Supports: Very supportive spouse. Patient worked in the distant past in [employer].  Two sons ages 53 and 84. Daughter age 43.   53. Follow up Palliative Care Visit:  02/14/2019 @  10am  I spent 60 minutes providing this consultation from 3pm - 4pm. More than 50% of the time in this consultation was spent coordinating communication.   HISTORY OF PRESENT ILLNESS:  Laura Anthony is a 43 y.o. female with 43  year old female with H/O: Metastatic (bone) L breast cancer (diagnosis Sept 2018);   -Oct 2018-Jan 2019: chemo -03/2017: L mastectomy; prophylactic R mastectomy -April-May 2019: XRT  -June 2020: MRI: R elbow bone metastasis. PET: bilateral pulmonary nodules, several lytic bone lesions c/w metastasis -July 2020 PET: LUL, lingular, R middle lobe nodules. Lytic bone metastasis infrerior pubic ramus, sacrum, T12, R 11th rib. Subsequent lung biopsy: metastatic carcinoma. -July 2020: palliative XRT R humerus -July/Aug 2020: XRT R 11th Rib/R elbow -Aug-Nov 2020: [facility] Dr. Janan Halter complicated by myopathy (prednisone rx), immune mediated thyroiditis, R UE DVT (apixaban). Trial discontinued d/t new T5 metastasis. -Nov 2020: Chemo -Nov 2020: Palliative XRT sternal, sacral, pelvic lesions and SRS for T3 and C7-T1 lesions  12/2-12/07/2018 hospitalized for unmanaged back pain (bony metastasis) improved with opioid adjustments. ABD/Pelvic CT: metastatic dx T-8  And T-12 vertebral bodies. Plan palliative XRT to spine (improvement in past with XRT).  H/O right upper extremity DVT (Eliquis), hyperlipidemia, right shoulder rotator cuff impingement syndrome, Attention deficit disorder, anemia, anxiety, hypothyroidism, GERD, and migraine.  Palliative Care was asked to help address goals of care.   CODE STATUS:  Full Code  PPS: 80%  HOSPICE ELIGIBILITY/DIAGNOSIS: TBD  PAST MEDICAL HISTORY:  Past Medical History:  Diagnosis Date  . ADD (attention deficit disorder)   . Anemia   . Anxiety   . Breast cancer, left breast ([place])    S/P mastectomy 03/27/2017  . DVT (deep venous thrombosis) ([place]) 2017   calf left - probably due to [facility] pills-took eliquis x3 mos, nonthing now  . High cholesterol   . Impingement syndrome of right shoulder 07/2013  . Migraine    "usually 1/month" (03/28/2017)  . PONV (postoperative nausea and vomiting)   . Right bicipital tenosynovitis 07/2013  . Rotator cuff  impingement syndrome of right shoulder 07/12/2013  . Seizures ([place])    x 1 as a child - was never on anticonvulsants (03/28/2017)    SOCIAL HX:  Social History   Tobacco Use  . Smoking status: Never Smoker  . Smokeless tobacco: Never Used  Substance Use Topics  . Alcohol use: Yes    Comment: Drinks very rare    ALLERGIES:  Allergies  Allergen Reactions  . Sumatriptan Other (See Comments)    Numbness to face   . Statins Other (See Comments)    Leg pain  . Tape Rash and Other (See Comments)    Rash from dressing over port-a-cath      PERTINENT MEDICATIONS:  Outpatient Encounter Medications as of 01/08/2019  Medication Sig  . ELIQUIS 5 MG TABS tablet Take 5 mg by mouth 2 (two) times daily.  Marland Kitchen FLUoxetine (PROZAC) 20 MG capsule Take 1 capsule (20 mg total) by mouth daily.  Marland Kitchen FLUoxetine (PROZAC) 40 MG capsule Take 40 mg by mouth every morning.   . gabapentin (NEURONTIN) 300 MG capsule Take 300 mg by mouth every evening.   . [EXPIRED] HYDROmorphone (DILAUDID) 4 MG tablet Take 1 tablet (4 mg total) by mouth every 3 (three) hours as needed for up to 5 days (breakthrough pain).  Marland Kitchen levothyroxine (SYNTHROID) 88 MCG tablet TAKE 1 TABLET (88 MCG TOTAL) BY MOUTH DAILY BEFORE BREAKFAST.  Marland Kitchen lidocaine (XYLOCAINE)  2 % solution Use as directed 10 mLs in the mouth or throat every 6 (six) hours as needed for mouth pain. (Patient not taking: Reported on 01/10/2019)  . lidocaine-prilocaine (EMLA) cream APPLY 1 APPLICATION TOPICALLY AS NEEDED.  Marland Kitchen lisdexamfetamine (VYVANSE) 30 MG capsule Take 30 mg by mouth every morning.   Marland Kitchen morphine (MS CONTIN) 60 MG 12 hr tablet Take 1 tablet (60 mg total) by mouth every 12 (twelve) hours.  . ondansetron (ZOFRAN) 8 MG tablet Take 1 tablet (8 mg total) by mouth 2 (two) times daily as needed (Nausea or vomiting).  . pantoprazole (PROTONIX) 40 MG tablet Take 1 tablet (40 mg total) by mouth 2 (two) times daily.  . polyethylene glycol (MIRALAX / GLYCOLAX) 17 g packet  Take 17 g by mouth daily as needed for mild constipation or moderate constipation.  . prochlorperazine (COMPAZINE) 10 MG tablet Take 1 tablet (10 mg total) by mouth every 6 (six) hours as needed (Nausea or vomiting).  . rizatriptan (MAXALT) 10 MG tablet Take 10 mg by mouth daily as needed for migraine (and may repeat once in 2 hours, if no relief).   Marland Kitchen senna-docusate (SENOKOT-S) 8.6-50 MG tablet Take 1 tablet by mouth 2 (two) times daily. (Patient taking differently: Take 1 tablet by mouth 2 (two) times daily as needed for moderate constipation. )  . spironolactone (ALDACTONE) 100 MG tablet Take 100 mg by mouth daily.   . sucralfate (CARAFATE) 1 g tablet Take 1 tablet by mouth 4 (four) times daily as needed (for indigestion). Crush and mix with 1oz of water and take before meals and at bedtime  . traZODone (DESYREL) 50 MG tablet Take 1 tablet (50 mg total) by mouth at bedtime as needed for sleep. (Patient taking differently: Take 50 mg by mouth at bedtime. )  . [DISCONTINUED] dexamethasone (DECADRON) 4 MG tablet Take 1 tablet (4 mg total) by mouth daily.   No facility-administered encounter medications on file as of 01/08/2019.    PHYSICAL EXAM:   PE limited by Telehealth nature of visit.  Well nourished, pleasantly conversant in no distress. No dyspnea with conversation. Her children pass by intermittently, in this busy household  Julianne Handler, NP

## 2019-01-10 ENCOUNTER — Inpatient Hospital Stay: Payer: 59

## 2019-01-10 ENCOUNTER — Other Ambulatory Visit: Payer: Self-pay | Admitting: Hematology and Oncology

## 2019-01-10 ENCOUNTER — Ambulatory Visit
Admission: RE | Admit: 2019-01-10 | Discharge: 2019-01-10 | Disposition: A | Discharge status: Home / Self Care | Payer: 59 | Source: Ambulatory Visit | Attending: Radiation Oncology | Admitting: Radiation Oncology

## 2019-01-10 ENCOUNTER — Other Ambulatory Visit: Payer: Self-pay | Admitting: *Deleted

## 2019-01-10 ENCOUNTER — Encounter: Payer: Self-pay | Admitting: *Deleted

## 2019-01-10 ENCOUNTER — Inpatient Hospital Stay (HOSPITAL_BASED_OUTPATIENT_CLINIC_OR_DEPARTMENT_OTHER): Payer: 59 | Admitting: Hematology and Oncology

## 2019-01-10 ENCOUNTER — Other Ambulatory Visit: Payer: Self-pay

## 2019-01-10 VITALS — BP 132/86 | HR 101 | Temp 97.8°F | Resp 20 | Ht 67.0 in | Wt 177.8 lb

## 2019-01-10 VITALS — BP 113/72 | HR 89 | Temp 98.8°F | Resp 18

## 2019-01-10 DIAGNOSIS — C7951 Secondary malignant neoplasm of bone: Secondary | ICD-10-CM

## 2019-01-10 DIAGNOSIS — Z7189 Other specified counseling: Secondary | ICD-10-CM

## 2019-01-10 DIAGNOSIS — Z9221 Personal history of antineoplastic chemotherapy: Secondary | ICD-10-CM | POA: Diagnosis not present

## 2019-01-10 DIAGNOSIS — C50512 Malignant neoplasm of lower-outer quadrant of left female breast: Secondary | ICD-10-CM | POA: Diagnosis not present

## 2019-01-10 DIAGNOSIS — G893 Neoplasm related pain (acute) (chronic): Secondary | ICD-10-CM

## 2019-01-10 DIAGNOSIS — Z79899 Other long term (current) drug therapy: Secondary | ICD-10-CM | POA: Diagnosis not present

## 2019-01-10 DIAGNOSIS — Z171 Estrogen receptor negative status [ER-]: Secondary | ICD-10-CM

## 2019-01-10 DIAGNOSIS — E039 Hypothyroidism, unspecified: Secondary | ICD-10-CM | POA: Diagnosis not present

## 2019-01-10 DIAGNOSIS — Z9013 Acquired absence of bilateral breasts and nipples: Secondary | ICD-10-CM | POA: Diagnosis not present

## 2019-01-10 DIAGNOSIS — Z95828 Presence of other vascular implants and grafts: Secondary | ICD-10-CM

## 2019-01-10 DIAGNOSIS — Z510 Encounter for antineoplastic radiation therapy: Secondary | ICD-10-CM | POA: Diagnosis not present

## 2019-01-10 DIAGNOSIS — Z5111 Encounter for antineoplastic chemotherapy: Secondary | ICD-10-CM | POA: Diagnosis not present

## 2019-01-10 DIAGNOSIS — D696 Thrombocytopenia, unspecified: Secondary | ICD-10-CM | POA: Diagnosis not present

## 2019-01-10 DIAGNOSIS — Z923 Personal history of irradiation: Secondary | ICD-10-CM | POA: Diagnosis not present

## 2019-01-10 LAB — CBC WITH DIFFERENTIAL (CANCER CENTER ONLY)
Abs Immature Granulocytes: 0 10*3/uL (ref 0.00–0.07)
Band Neutrophils: 1 %
Basophils Absolute: 0.1 10*3/uL (ref 0.0–0.1)
Basophils Relative: 1 %
Eosinophils Absolute: 0 10*3/uL (ref 0.0–0.5)
Eosinophils Relative: 0 %
HCT: 28.1 % — ABNORMAL LOW (ref 36.0–46.0)
Hemoglobin: 9.7 g/dL — ABNORMAL LOW (ref 12.0–15.0)
Lymphocytes Relative: 10 %
Lymphs Abs: 0.6 10*3/uL — ABNORMAL LOW (ref 0.7–4.0)
MCH: 35.1 pg — ABNORMAL HIGH (ref 26.0–34.0)
MCHC: 34.5 g/dL (ref 30.0–36.0)
MCV: 101.8 fL — ABNORMAL HIGH (ref 80.0–100.0)
Monocytes Absolute: 0.2 10*3/uL (ref 0.1–1.0)
Monocytes Relative: 3 %
Neutro Abs: 4.7 10*3/uL (ref 1.7–7.7)
Neutrophils Relative %: 85 %
Platelet Count: 124 10*3/uL — ABNORMAL LOW (ref 150–400)
RBC: 2.76 MIL/uL — ABNORMAL LOW (ref 3.87–5.11)
RDW: 15 % (ref 11.5–15.5)
WBC Count: 5.5 10*3/uL (ref 4.0–10.5)
nRBC: 0.4 % — ABNORMAL HIGH (ref 0.0–0.2)

## 2019-01-10 LAB — CMP (CANCER CENTER ONLY)
ALT: 18 U/L (ref 0–44)
AST: 16 U/L (ref 15–41)
Albumin: 3.4 g/dL — ABNORMAL LOW (ref 3.5–5.0)
Alkaline Phosphatase: 93 U/L (ref 38–126)
Anion gap: 8 (ref 5–15)
BUN: 20 mg/dL (ref 6–20)
CO2: 30 mmol/L (ref 22–32)
Calcium: 8.7 mg/dL — ABNORMAL LOW (ref 8.9–10.3)
Chloride: 98 mmol/L (ref 98–111)
Creatinine: 0.83 mg/dL (ref 0.44–1.00)
GFR, Est AFR Am: >60 mL/min (ref >60)
GFR, Estimated: >60 mL/min (ref >60)
Glucose, Bld: 102 mg/dL — ABNORMAL HIGH (ref 70–99)
Potassium: 3.3 mmol/L — ABNORMAL LOW (ref 3.5–5.1)
Sodium: 136 mmol/L (ref 135–145)
Total Bilirubin: 0.3 mg/dL (ref 0.3–1.2)
Total Protein: 7 g/dL (ref 6.5–8.1)

## 2019-01-10 MED ORDER — DIPHENHYDRAMINE HCL 25 MG PO CAPS
ORAL_CAPSULE | ORAL | Status: AC
  Filled 2019-01-10: qty 2

## 2019-01-10 MED ORDER — HEPARIN SOD (PORK) LOCK FLUSH 100 UNIT/ML IV SOLN
500.0000 [IU] | Freq: Once | INTRAVENOUS | Status: AC | PRN
  Administered 2019-01-10: 13:00:00 500 [IU]
  Filled 2019-01-10: qty 5

## 2019-01-10 MED ORDER — TRASTUZUMAB-DKST CHEMO 150 MG IV SOLR
450.0000 mg | Freq: Once | INTRAVENOUS | Status: AC
  Administered 2019-01-10: 450 mg via INTRAVENOUS
  Filled 2019-01-10: qty 21.43

## 2019-01-10 MED ORDER — ACETAMINOPHEN 325 MG PO TABS
650.0000 mg | ORAL_TABLET | Freq: Once | ORAL | Status: AC
  Administered 2019-01-10: 650 mg via ORAL

## 2019-01-10 MED ORDER — SODIUM CHLORIDE 0.9% FLUSH
10.0000 mL | INTRAVENOUS | Status: DC | PRN
  Administered 2019-01-10: 10 mL
  Filled 2019-01-10: qty 10

## 2019-01-10 MED ORDER — PROCHLORPERAZINE MALEATE 10 MG PO TABS
ORAL_TABLET | ORAL | Status: AC
  Filled 2019-01-10: qty 1

## 2019-01-10 MED ORDER — SODIUM CHLORIDE 0.9 % IV SOLN
Freq: Once | INTRAVENOUS | Status: AC
  Administered 2019-01-10: 10:00:00 via INTRAVENOUS
  Filled 2019-01-10: qty 250

## 2019-01-10 MED ORDER — DENOSUMAB 120 MG/1.7ML ~~LOC~~ SOLN
SUBCUTANEOUS | Status: AC
  Filled 2019-01-10: qty 1.7

## 2019-01-10 MED ORDER — PROCHLORPERAZINE MALEATE 10 MG PO TABS
10.0000 mg | ORAL_TABLET | Freq: Once | ORAL | Status: AC
  Administered 2019-01-10: 10 mg via ORAL

## 2019-01-10 MED ORDER — DEXAMETHASONE 1 MG PO TABS: 1.0000 mg | ORAL_TABLET | Freq: Every day | ORAL | 0 refills | Status: DC

## 2019-01-10 MED ORDER — ALTEPLASE 2 MG IJ SOLR
2.0000 mg | Freq: Once | INTRAMUSCULAR | Status: DC | PRN
  Filled 2019-01-10: qty 2

## 2019-01-10 MED ORDER — SODIUM CHLORIDE 0.9 % IV SOLN
1.0500 mg/m2 | Freq: Once | INTRAVENOUS | Status: AC
  Administered 2019-01-10: 2 mg via INTRAVENOUS
  Filled 2019-01-10: qty 4

## 2019-01-10 MED ORDER — SODIUM CHLORIDE 0.9 % IV SOLN
420.0000 mg | Freq: Once | INTRAVENOUS | Status: AC
  Administered 2019-01-10: 420 mg via INTRAVENOUS
  Filled 2019-01-10: qty 14

## 2019-01-10 MED ORDER — ACETAMINOPHEN 325 MG PO TABS
ORAL_TABLET | ORAL | Status: AC
  Filled 2019-01-10: qty 2

## 2019-01-10 MED ORDER — DENOSUMAB 120 MG/1.7ML ~~LOC~~ SOLN
120.0000 mg | Freq: Once | SUBCUTANEOUS | Status: AC
  Administered 2019-01-10: 120 mg via SUBCUTANEOUS

## 2019-01-10 MED ORDER — HEPARIN SOD (PORK) LOCK FLUSH 100 UNIT/ML IV SOLN
500.0000 [IU] | Freq: Once | INTRAVENOUS | Status: DC | PRN
  Filled 2019-01-10: qty 5

## 2019-01-10 MED ORDER — DIPHENHYDRAMINE HCL 25 MG PO CAPS
50.0000 mg | ORAL_CAPSULE | Freq: Once | ORAL | Status: AC
  Administered 2019-01-10: 50 mg via ORAL

## 2019-01-10 MED ORDER — SODIUM CHLORIDE 0.9% FLUSH
10.0000 mL | INTRAVENOUS | Status: DC | PRN
  Administered 2019-01-10: 10 mL via INTRAVENOUS
  Filled 2019-01-10: qty 10

## 2019-01-10 NOTE — Assessment & Plan Note (Signed)
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 Neoadjuvant chemotherapy with TCHPcompleted 02/17/2017 this would be followed by Herceptinand Perjetamaintenance for 1 yearcompleted September 2019 2.Bilateral mastectomies 03/28/2016:Bilateral mastectomies: Left mastectomy: IDC grade 2 0.9 cm, nodes negative, right mastectomy benign, ER 0%, PR 0%, HER-2 positive ratio 2.6 3.Adjuvant radiation4/08/2017 to 06/09/2017 4.Neratinib started 10/12/2017 discontinued due to diarrhea 5.  Elbow fracture: Due to metastatic disease, palliative radiation therapy 6.  Carboplatin atezolizumab at [facility] on a clinical trial [facility] 043 stopped for progression 12/04/2018 ---------------------------------------------------------------------------------------------------------------- Lung nodule biopsy: Metastatic breast cancer triple negative  CT CAP 12/03/2018: Progression of lung nodules and bone metastases done at [facility]  Current treatment: Halaven days 1 and 8 every 3 weeks with Herceptin and Perjeta every 3 weeks, today cycle 2 day 8 Halaven Chemo toxicities: 1. Hospitalization:  November 2020: Neutropenic fever at [facility] 01/02/2019-01/06/2019: Hospitalization for intractable back pain, MRI thoracic spine 01/03/2019: T12 and T8 metastatic disease. CT abdomen and pelvis: 01/01/2019: No significant change in the right middle lobe metastases. Multiple bone metastases progressed. Healing fracture right eighth rib 2. Neutropenic  fever: Halaven dose reduced  Palliative care is assisting with the patient's pain regimen. Return to clinic in 2 weeks for cycle 3. Our plan is to do scans after 3 cycles are complete.  Today patient receives Halaven along with Herceptin and Perjeta. She received day 1 of Halaven in the hospital.   Bone metastases: [facility] See Return to clinic in 2 weeks for cycle 2

## 2019-01-11 ENCOUNTER — Ambulatory Visit
Admission: RE | Admit: 2019-01-11 | Discharge: 2019-01-11 | Disposition: A | Discharge status: Home / Self Care | Payer: 59 | Source: Ambulatory Visit | Attending: Radiation Oncology | Admitting: Radiation Oncology

## 2019-01-11 ENCOUNTER — Other Ambulatory Visit: Payer: Self-pay

## 2019-01-11 DIAGNOSIS — Z510 Encounter for antineoplastic radiation therapy: Secondary | ICD-10-CM | POA: Diagnosis not present

## 2019-01-14 ENCOUNTER — Other Ambulatory Visit: Payer: Self-pay

## 2019-01-14 ENCOUNTER — Ambulatory Visit
Admission: RE | Admit: 2019-01-14 | Discharge: 2019-01-14 | Disposition: A | Discharge status: Home / Self Care | Payer: 59 | Source: Ambulatory Visit | Attending: Radiation Oncology | Admitting: Radiation Oncology

## 2019-01-14 DIAGNOSIS — Z510 Encounter for antineoplastic radiation therapy: Secondary | ICD-10-CM | POA: Diagnosis not present

## 2019-01-15 ENCOUNTER — Ambulatory Visit
Admission: RE | Admit: 2019-01-15 | Discharge: 2019-01-15 | Disposition: A | Discharge status: Home / Self Care | Payer: 59 | Source: Ambulatory Visit | Attending: Radiation Oncology | Admitting: Radiation Oncology

## 2019-01-15 ENCOUNTER — Other Ambulatory Visit: Payer: Self-pay

## 2019-01-15 DIAGNOSIS — Z510 Encounter for antineoplastic radiation therapy: Secondary | ICD-10-CM | POA: Diagnosis not present

## 2019-01-16 ENCOUNTER — Ambulatory Visit: Admission: RE | Admit: 2019-01-16 | Payer: 59 | Source: Ambulatory Visit

## 2019-01-16 ENCOUNTER — Other Ambulatory Visit: Payer: Self-pay

## 2019-01-16 ENCOUNTER — Inpatient Hospital Stay: Payer: 59

## 2019-01-16 ENCOUNTER — Encounter: Payer: Self-pay | Admitting: Radiation Oncology

## 2019-01-16 ENCOUNTER — Other Ambulatory Visit: Payer: Self-pay | Admitting: *Deleted

## 2019-01-16 ENCOUNTER — Inpatient Hospital Stay (HOSPITAL_BASED_OUTPATIENT_CLINIC_OR_DEPARTMENT_OTHER): Payer: 59 | Admitting: Medical

## 2019-01-16 ENCOUNTER — Encounter: Payer: Self-pay | Admitting: Medical

## 2019-01-16 ENCOUNTER — Ambulatory Visit
Admission: RE | Admit: 2019-01-16 | Discharge: 2019-01-16 | Disposition: A | Discharge status: Home / Self Care | Payer: 59 | Source: Ambulatory Visit | Attending: Radiation Oncology | Admitting: Radiation Oncology

## 2019-01-16 ENCOUNTER — Inpatient Hospital Stay: Admission: AD | Admit: 2019-01-16 | Payer: 59 | Source: Ambulatory Visit | Admitting: Hematology and Oncology

## 2019-01-16 VITALS — BP 104/77 | HR 113 | Temp 98.8°F | Resp 18

## 2019-01-16 DIAGNOSIS — R110 Nausea: Secondary | ICD-10-CM | POA: Diagnosis not present

## 2019-01-16 DIAGNOSIS — R5081 Fever presenting with conditions classified elsewhere: Secondary | ICD-10-CM

## 2019-01-16 DIAGNOSIS — Z171 Estrogen receptor negative status [ER-]: Secondary | ICD-10-CM

## 2019-01-16 DIAGNOSIS — I82409 Acute embolism and thrombosis of unspecified deep veins of unspecified lower extremity: Secondary | ICD-10-CM

## 2019-01-16 DIAGNOSIS — C50512 Malignant neoplasm of lower-outer quadrant of left female breast: Secondary | ICD-10-CM

## 2019-01-16 DIAGNOSIS — Z95828 Presence of other vascular implants and grafts: Secondary | ICD-10-CM

## 2019-01-16 DIAGNOSIS — Z510 Encounter for antineoplastic radiation therapy: Secondary | ICD-10-CM | POA: Diagnosis not present

## 2019-01-16 DIAGNOSIS — C7951 Secondary malignant neoplasm of bone: Secondary | ICD-10-CM

## 2019-01-16 DIAGNOSIS — D709 Neutropenia, unspecified: Secondary | ICD-10-CM

## 2019-01-16 DIAGNOSIS — E876 Hypokalemia: Secondary | ICD-10-CM

## 2019-01-16 LAB — CMP (CANCER CENTER ONLY)
ALT: 21 U/L (ref 0–44)
AST: 19 U/L (ref 15–41)
Albumin: 3.4 g/dL — ABNORMAL LOW (ref 3.5–5.0)
Alkaline Phosphatase: 80 U/L (ref 38–126)
Anion gap: 8 (ref 5–15)
BUN: 12 mg/dL (ref 6–20)
CO2: 29 mmol/L (ref 22–32)
Calcium: 8.5 mg/dL — ABNORMAL LOW (ref 8.9–10.3)
Chloride: 96 mmol/L — ABNORMAL LOW (ref 98–111)
Creatinine: 0.79 mg/dL (ref 0.44–1.00)
GFR, Est AFR Am: >60 mL/min (ref >60)
GFR, Estimated: >60 mL/min (ref >60)
Glucose, Bld: 137 mg/dL — ABNORMAL HIGH (ref 70–99)
Potassium: 3.3 mmol/L — ABNORMAL LOW (ref 3.5–5.1)
Sodium: 133 mmol/L — ABNORMAL LOW (ref 135–145)
Total Bilirubin: 0.4 mg/dL (ref 0.3–1.2)
Total Protein: 6.6 g/dL (ref 6.5–8.1)

## 2019-01-16 LAB — CBC WITH DIFFERENTIAL (CANCER CENTER ONLY)
Abs Immature Granulocytes: 0.02 10*3/uL (ref 0.00–0.07)
Basophils Absolute: 0 10*3/uL (ref 0.0–0.1)
Basophils Relative: 1 %
Eosinophils Absolute: 0 10*3/uL (ref 0.0–0.5)
Eosinophils Relative: 0 %
HCT: 28.2 % — ABNORMAL LOW (ref 36.0–46.0)
Hemoglobin: 9.6 g/dL — ABNORMAL LOW (ref 12.0–15.0)
Immature Granulocytes: 2 %
Lymphocytes Relative: 23 %
Lymphs Abs: 0.2 10*3/uL — ABNORMAL LOW (ref 0.7–4.0)
MCH: 34.8 pg — ABNORMAL HIGH (ref 26.0–34.0)
MCHC: 34 g/dL (ref 30.0–36.0)
MCV: 102.2 fL — ABNORMAL HIGH (ref 80.0–100.0)
Monocytes Absolute: 0.2 10*3/uL (ref 0.1–1.0)
Monocytes Relative: 22 %
Neutro Abs: 0.4 10*3/uL — CL (ref 1.7–7.7)
Neutrophils Relative %: 52 %
Platelet Count: 96 10*3/uL — ABNORMAL LOW (ref 150–400)
RBC: 2.76 MIL/uL — ABNORMAL LOW (ref 3.87–5.11)
RDW: 14.8 % (ref 11.5–15.5)
WBC Count: 0.8 10*3/uL — CL (ref 4.0–10.5)
nRBC: 6.1 % — ABNORMAL HIGH (ref 0.0–0.2)

## 2019-01-16 LAB — URINALYSIS, COMPLETE (UACMP) WITH MICROSCOPIC
Bilirubin Urine: NEGATIVE
Glucose, UA: NEGATIVE mg/dL
Ketones, ur: NEGATIVE mg/dL
Leukocytes,Ua: NEGATIVE
Nitrite: NEGATIVE
Protein, ur: NEGATIVE mg/dL
Specific Gravity, Urine: 1.004 — ABNORMAL LOW (ref 1.005–1.030)
pH: 6 (ref 5.0–8.0)

## 2019-01-16 LAB — MAGNESIUM: Magnesium: 1.6 mg/dL — ABNORMAL LOW (ref 1.7–2.4)

## 2019-01-16 MED ORDER — SODIUM CHLORIDE 0.9 % IV SOLN: INTRAVENOUS | Status: DC

## 2019-01-16 MED ORDER — TBO-FILGRASTIM 300 MCG/0.5ML ~~LOC~~ SOSY
PREFILLED_SYRINGE | SUBCUTANEOUS | Status: AC
  Filled 2019-01-16: qty 0.5

## 2019-01-16 MED ORDER — LEVOFLOXACIN 750 MG PO TABS: 750.0000 mg | ORAL_TABLET | Freq: Every day | ORAL | 0 refills | Status: DC

## 2019-01-16 MED ORDER — FILGRASTIM-SNDZ 300 MCG/0.5ML IJ SOSY: 300.0000 ug | PREFILLED_SYRINGE | Freq: Once | INTRAMUSCULAR | Status: DC

## 2019-01-16 MED ORDER — SODIUM CHLORIDE 0.9 % IV SOLN
Freq: Once | INTRAVENOUS | Status: AC
  Filled 2019-01-16: qty 250

## 2019-01-16 MED ORDER — MORPHINE SULFATE ER 60 MG PO TBCR: 60.0000 mg | EXTENDED_RELEASE_TABLET | Freq: Two times a day (BID) | ORAL | 0 refills | Status: DC

## 2019-01-16 MED ORDER — SODIUM CHLORIDE 0.9 % IV SOLN
2.0000 g | Freq: Once | INTRAVENOUS | Status: AC
  Administered 2019-01-16: 2 g via INTRAVENOUS
  Filled 2019-01-16: qty 2

## 2019-01-16 MED ORDER — ONDANSETRON HCL 4 MG/2ML IJ SOLN
8.0000 mg | Freq: Once | INTRAMUSCULAR | Status: AC
  Administered 2019-01-16: 8 mg via INTRAVENOUS

## 2019-01-16 MED ORDER — TBO-FILGRASTIM 300 MCG/0.5ML ~~LOC~~ SOSY: 300.0000 ug | PREFILLED_SYRINGE | Freq: Once | SUBCUTANEOUS | Status: DC

## 2019-01-16 MED ORDER — ONDANSETRON HCL 4 MG/2ML IJ SOLN
INTRAMUSCULAR | Status: AC
  Filled 2019-01-16: qty 4

## 2019-01-16 MED ORDER — VANCOMYCIN HCL 1000 MG IV SOLR
1000.0000 mg | Freq: Once | INTRAVENOUS | Status: AC
  Administered 2019-01-16: 1000 mg via INTRAVENOUS
  Filled 2019-01-16: qty 1000

## 2019-01-16 MED ORDER — SODIUM CHLORIDE 0.9% FLUSH
10.0000 mL | INTRAVENOUS | Status: DC | PRN
  Filled 2019-01-16: qty 10

## 2019-01-16 MED ORDER — FILGRASTIM-SNDZ 300 MCG/0.5ML IJ SOSY
300.0000 ug | PREFILLED_SYRINGE | Freq: Once | INTRAMUSCULAR | Status: AC
  Administered 2019-01-16: 300 ug via SUBCUTANEOUS

## 2019-01-16 MED ORDER — FILGRASTIM-SNDZ 300 MCG/0.5ML IJ SOSY
PREFILLED_SYRINGE | INTRAMUSCULAR | Status: AC
  Filled 2019-01-16: qty 0.5

## 2019-01-16 NOTE — Progress Notes (Signed)
These preliminary result these preliminary results were noted.  Awaiting final report.

## 2019-01-16 NOTE — Progress Notes (Signed)
Laura Anthony is a very pleasant 43 year old woman known to our practice with a history of metastatic breast cancer.  She is currently receiving systemic Halaven/Perjecta/Ogivri with Dr. Lindi Adie. She recently was admitted during her first cycle of this in late November 2020 while out of town with febrile neutropenia.  She has undergone multiple courses of palliative radiotherapy due to bony metastatic disease and is currently receiving palliative radiotherapy to the T-spine and rib area on the left.  She came in today stating that she was feeling hot and had been noticing the symptoms since last night.  She has been quite flushed as well.  Her temperature at the front desk was 101 F, and she presented to the radiation department for treatment.  Upon finding out her temperature I was asked to see the patient.  She reports that she has been feeling hot but has not taken her temperature at home, she states that she is also been having diarrhea for the last week, poor oral intake, and in the last 24 hours increasing discomfort in her lower extremities along the right thigh she describes cramping pain in the left calf also pain.  She routinely takes Eliquis and reports that she has been compliant with her medication.  She also reports increasing abdominal fullness in the last 2 weeks despite this she reports that she is passing gas.  She denies any upper respiratory symptoms, chest pain or shortness of breath but states she is a little winded this morning and feels that this is because of wearing a mask while feeling so hot.  She reports that she saw a pink tinge after wiping after using the restroom this morning but has not had any frank dysuria or hematuria.  She is also quite upset after I asked her who was with her today and she states her husband left town last week to go to [employer] and she thinks that he is starting to realize the severity of her illness.  PE T: 24 F at front desk with  temporal thermometer, 98.4 F temporal thermometer in our office, then 100.4 F with oral thermometer in our office 121/28 RR 22 P 130 Pulse ox 100% RA In general this is a acutely ill-appearing Caucasian female in no acute distress.  Her face is flushed.  She is alert and oriented x4 and appropriate throughout the examination.  Cardiovascular exam reveals a tachycardic rate but normal rhythm.  No clicks, rubs or murmurs are auscultated.  Chest is clear to auscultation bilaterally.  Abdomen has active bowel sounds in all quadrants, the abdomen is moderately distended and tympanic to percussion though nontender and soft.  Lower extremities have intact pedal pulses bilaterally, posterior tibialis is intact bilaterally and regular.  She has deep calf tenderness to the left calf without pitting edema.  The left thigh is somewhat uncomfortable to mild touch as well.  AP: 1. Possible febrile neutropenia unlikely dehydration.  The patient's clinical situation is concerning given her recent history of febrile neutropenia, poor oral intake and diarrhea.  I contacted Dr. Lindi Adie and he will see the patient today and symptom management clinic.  She may require hospitalization, the patient is aware of this and will go upstairs for stat labs.  Given her persistent temperature we will hold her radiation today, with the possibility of resuming in the next few days.     Carola Rhine, PAC

## 2019-01-16 NOTE — Progress Notes (Signed)
[facility] Progress Note   Deisi Daloia [id number] [birth year] 43 y.o.  Yazmine Zuck Ells is managed by Dr. Nicholas Lose  Actively treated with chemotherapy/immunotherapy/hormonal therapy: yes  Current therapy: Halaven  Last treated: 01/10/2019 (cycle 2, day 8)  Next scheduled appointment with provider: 01/24/2019  Assessment: Plan:    Neutropenic fever ([place]) - Plan: Urine Culture, Urinalysis, Complete w Microscopic, Culture, Blood, Culture, Blood, 0.9 %  sodium chloride infusion, vancomycin (VANCOCIN) 1,000 mg in sodium chloride 0.9 % 250 mL IVPB, ceFEPIme (MAXIPIME) 2 g in sodium chloride 0.9 % 100 mL IVPB, filgrastim-sndz (ZARXIO) injection 300 mcg, levofloxacin (LEVAQUIN) 750 MG tablet, filgrastim-sndz (ZARXIO) injection 300 mcg, DISCONTINUED: Tbo-Filgrastim (GRANIX) injection 300 mcg, CANCELED: CBC with Differential/Platelet  Nausea without vomiting - Plan: ondansetron (ZOFRAN) injection 8 mg, CANCELED: Comprehensive metabolic panel  Malignant neoplasm of lower-outer quadrant of left breast of female, estrogen receptor negative ([place])  Bone metastases ([place])  Acute embolism and thrombosis of deep vein of lower extremity, unspecified laterality (HCC)  Hypokalemia - Plan: sodium chloride 0.9 % 1,000 mL with potassium chloride 40 mEq infusion, CANCELED: Comprehensive metabolic panel  Hypomagnesemia - Plan: CANCELED: Magnesium   Neutropenic fever: Blood cultures x2, urine culture, and urinalysis were collected.  The patient was given vancomycin 1 g IV x1 cefepime 2 g IV x1.  She was additionally given Zarxio 300 mcg subcu and was additionally planned for and admission to [facility].  She was feeling better after IV fluids and antibiotics and elected to be discharged home on Levaquin 750 mg p.o. once daily x7 days.  Dr. Lindi Adie was in agreement with this plan.  She will return to clinic tomorrow for a CBC, 1 L of IV fluids and additional  dose of Zarxio 300 mcg subcu. a determination will be made to poor whether the patient needs to return on Friday and Saturday for additional doses of Zarxio.   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 Nicholas Lose.  She is status post cycle 2, day 8 of therapy which was dosed on 01/10/2019.  She is scheduled to return for follow-up on 01/24/2019.  History of acute lower extremity deep vein thrombosis: She continues on Eliquis 5 mg p.o. twice daily.  Hypokalemia: A chemistry panel returned today with a potassium of 3.3.  She will be given 40 mEq of potassium tomorrow with her IV fluid.  Hypomagnesemia.  The patient's magnesium level slightly low at 1.6 today.  This will be rechecked on her return on 01/24/2019.  We are hopeful that her magnesium level will rebound as her appetite improves.  Please see After Visit Summary for patient specific instructions.  Future Appointments  Date Time Provider [place]  01/17/2019 11:30 AM [facility] [id number] [facility] None  01/18/2019 11:30 AM [facility] [id number] [facility] None  01/21/2019  1:45 PM [facility] [id number] [facility] None  01/24/2019 10:45 AM [facility] LAB 2 [facility] None  01/24/2019 11:00 AM [facility] [place] FLUSH [facility] None  01/24/2019 11:30 AM Nicholas Lose, MD [facility] None  01/24/2019 12:30 PM [facility] INFUSION [facility] None  06/21/2019 10:45 AM Nicholas Lose, MD [facility] None    Orders Placed This Encounter  Procedures  . Urine Culture  . Culture, Blood  . Culture, Blood  . Urinalysis, Complete w Microscopic       Subjective:   Patient  ID:  Sebella Chawla is a 43 y.o. (DOB 22-Aug-1975) female.  Chief Complaint: No chief complaint on file.   HPI Florentina Burgeson  Is a 43 y.o. 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 She continues on Halaven which is given under the direction of Dr. Nicholas Lose.  She is status post cycle 2, day 8 of therapy which was dosed on 01/10/2019.  She is also receiving radiation therapy to her spine.  She has completed 7 of 10 fractions of radiation therapy with her pain greatly improved.  She presents to the office today with leg cramping, nausea with one episode of vomiting earlier this morning.  Overall she is weak.  She reports having a sore in her right lower mouth.  She noted that her urine was blood-tinged this morning.  She had a fever of 101.0 this morning with her temperature noted to be 100.4 when she arrived in clinic today.  Her CBC returned today showing a WBC of 0.8 with an ANC of 0.4.  She had initially been agreeable to be admitted to the hospital for IV fluids, IV antibiotics, and growth factor however after receiving IV fluids, vancomycin and cefepime along with Zarxio 300 mcg subcu she wanted to be managed as an outpatient.  Dr. Nicholas Lose was agreeable with this management.  She is agreeable to return tomorrow to be seen after her radiation therapy appointment.  Plans are for her to receive IV fluids along with IV potassium and Zarxio 300 mcg subcu in addition to having a CBC completed.  She is agreeable to call EMS or present to the emergency room should she experience increasing fevers, shaking chills, or note that her condition worsens in any manner.  Medications: I have reviewed the patient's current medications.  Allergies:  Allergies  Allergen Reactions  . Sumatriptan Other (See Comments)    Numbness to face   . Statins Other (See Comments)    Leg pain  . Tape Rash and Other (See Comments)    Rash from dressing over port-a-cath     Past Medical History:  Diagnosis Date  . ADD (attention deficit disorder)   . Anemia   . Anxiety   . Breast cancer, left breast ([place])    S/P  mastectomy 03/27/2017  . DVT (deep venous thrombosis) ([place]) 2017   calf left - probably due to [facility] pills-took eliquis x3 mos, nonthing now  . High cholesterol   . Impingement syndrome of right shoulder 07/2013  . Migraine    "usually 1/month" (03/28/2017)  . PONV (postoperative nausea and vomiting)   . Right bicipital tenosynovitis 07/2013  . Rotator cuff impingement syndrome of right shoulder 07/12/2013  . Seizures ([place])    x 1 as a child - was never on anticonvulsants (03/28/2017)    Past Surgical History:  Procedure Laterality Date  . ADENOIDECTOMY  1981  . ANKLE ARTHROSCOPY Right   . BREAST BIOPSY Left 10/2016  . KNEE ARTHROSCOPY Right   . KNEE ARTHROSCOPY W/ ACL RECONSTRUCTION Left   . LIPOSUCTION WITH LIPOFILLING Bilateral 01/05/2018   Procedure: LIPOFILLING FROM ABDOMEN TO BILATERAL CHEST;  Surgeon: Irene Limbo, MD;  Location: [place];  Service: Plastics;  Laterality: Bilateral;  . MASTECTOMY Left 03/27/2017   NIPPLE SPARING MASTECTOMY WITH RADIOACTIVE SEED TARGETED LYMPH NODE EXCISION AND LEFT AXILLARY SENTINEL LYMPH NODE BIOPSY  . MASTECTOMY Right 03/27/2017   RIGHT PROPHYLACTIC NIPPLE SPARING  MASTECTOMY  . NIPPLE SPARING MASTECTOMY Right 03/27/2017   Procedure: RIGHT PROPHYLACTIC NIPPLE SPARING MASTECTOMY;  Surgeon: Rolm Bookbinder, MD;  Location: [place];  Service: General;  Laterality: Right;  . PORT-A-CATH REMOVAL Right 01/05/2018   Procedure: REMOVAL RIGHT CHEST PORT;  Surgeon: Irene Limbo, MD;  Location: [place];  Service: Plastics;  Laterality: Right;  . PORTACATH PLACEMENT N/A 11/01/2016   Procedure: INSERTION PORT-A-CATH WITH [place];  Surgeon: Rolm Bookbinder, MD;  Location: [place];  Service: General;  Laterality: N/A;  . PORTACATH PLACEMENT N/A 08/23/2018   Procedure: INSERTION PORT-A-CATH WITH ULTRASOUND;  Surgeon: Rolm Bookbinder, MD;  Location: [place];  Service: General;  Laterality:  N/A;  . RADIOACTIVE SEED GUIDED AXILLARY SENTINEL LYMPH NODE Left 03/27/2017   Procedure: LEFT NIPPLE SPARING MASTECTOMY WITH RADIOACTIVE SEED TARGETED LYMPH NODE EXCISION AND LEFT AXILLARY SENTINEL LYMPH NODE BIOPSY;  Surgeon: Rolm Bookbinder, MD;  Location: [place];  Service: General;  Laterality: Left;  REQUESTS RNFA  . RECONSTRUCTION BREAST IMMEDIATE / DELAYED W/ TISSUE EXPANDER Bilateral 03/27/2017   BILATERAL BREAST RECONSTRUCTION WITH PLACEMENT OF TISSUE EXPANDER AND ALLODERM  . REMOVAL OF BILATERAL TISSUE EXPANDERS WITH PLACEMENT OF BILATERAL BREAST IMPLANTS Bilateral 01/05/2018   Procedure: REMOVAL OF BILATERAL TISSUE EXPANDERS WITH PLACEMENT OF BILATERAL BREAST IMPLANTS;  Surgeon: Irene Limbo, MD;  Location: [place];  Service: Plastics;  Laterality: Bilateral;  . SEPTOPLASTY WITH ETHMOIDECTOMY, AND MAXILLARY ANTROSTOMY  10/29/2010   bilat. max. antrostomy with left max. stripping; left ant. ethmoidectomy; right total ethmoidectomy; sphenoidotomy  . SHOULDER ARTHROSCOPY WITH SUBACROMIAL DECOMPRESSION AND BICEP TENDON REPAIR Right 07/12/2013   Procedure: RIGHT SHOULDER ARTHROSCOPY DEBRIDEMENT EXTENSIVE DECOMPRESSION SUBACROMIAL PARTIAL ACROMIOPLASTY;  Surgeon: Johnny Bridge, MD;  Location: [place];  Service: Orthopedics;  Laterality: Right;  . WRIST ARTHROSCOPY  01/17/2012   Procedure: ARTHROSCOPY WRIST; right wrist Surgeon: Tennis Must, MD;  Location: [place];  Service: Orthopedics;  Laterality: Right;  RIGHT WRIST ARTHROSCOPY WITH TRIANGULAR FIBROCARTILAGE COMPLEX REPAIR AND DEBRIDEMENT     Family History  Problem Relation Age of Onset  . Breast cancer Mother 60       triple negative  . Leukemia Father   . Lung cancer Father   . Heart attack Maternal Uncle   . Prostate cancer Paternal Uncle   . COPD Paternal Grandmother   . Heart disease Paternal Grandfather   . Prostate cancer Paternal Uncle   .  Leukemia Cousin     Social History   Socioeconomic History  . Marital status: Married    Spouse name: Not on file  . Number of children: Not on file  . Years of education: Not on file  . Highest education level: Not on file  Occupational History  . Not on file  Tobacco Use  . Smoking status: Never Smoker  . Smokeless tobacco: Never Used  Substance and Sexual Activity  . Alcohol use: Yes    Comment: Drinks very rare  . Drug use: No  . Sexual activity: Yes    Birth control/protection: Surgical    Comment: husband has had a vasectomy  Other Topics Concern  . Not on file  Social History Narrative  . Not on file   Social Determinants of Health   Financial Resource Strain:   . Difficulty of Paying Living Expenses: Not on file  Food Insecurity:   . Worried About Charity fundraiser in the Last Year: Not on  file  . [place] in the Last Year: Not on file  Transportation Needs:   . Lack of Transportation (Medical): Not on file  . Lack of Transportation (Non-Medical): Not on file  Physical Activity:   . Days of Exercise per Week: Not on file  . Minutes of Exercise per Session: Not on file  Stress:   . Feeling of Stress : Not on file  Social Connections:   . Frequency of Communication with Friends and Family: Not on file  . Frequency of Social Gatherings with Friends and Family: Not on file  . Attends Religious Services: Not on file  . Active Member of Clubs or Organizations: Not on file  . Attends Archivist Meetings: Not on file  . Marital Status: Not on file  Intimate Partner Violence:   . Fear of Current or Ex-Partner: Not on file  . Emotionally Abused: Not on file  . Physically Abused: Not on file  . Sexually Abused: Not on file    Past Medical History, Surgical history, Social history, and Family history were reviewed and updated as appropriate.   Please see review of systems for further details on the patient's review from today.   Review of  Systems:  Review of Systems  Constitutional: Positive for appetite change and fever. Negative for chills and diaphoresis.  Respiratory: Negative for cough, choking, shortness of breath and wheezing.   Cardiovascular: Negative for chest pain and palpitations.  Gastrointestinal: Positive for nausea. Negative for constipation, diarrhea and vomiting.  Genitourinary: Positive for hematuria. Negative for decreased urine volume.  Neurological: Positive for weakness. Negative for headaches.    Objective:   Physical Exam:  BP 104/77 (BP Location: Right Arm, Patient Position: Sitting)   Pulse (!) 113   Temp 98.8 F (37.1 C) (Oral)   Resp 18   SpO2 98%  ECOG: 1  Physical Exam Constitutional:      General: She is not in acute distress.    Appearance: She is not diaphoretic.  HENT:     Head: Normocephalic and atraumatic.     Mouth/Throat:     Mouth: Mucous membranes are moist.   Eyes:     General: No scleral icterus.       Right eye: No discharge.        Left eye: No discharge.  Cardiovascular:     Rate and Rhythm: Normal rate and regular rhythm.     Heart sounds: Normal heart sounds. No murmur. No friction rub. No gallop.   Pulmonary:     Effort: Pulmonary effort is normal. No respiratory distress.     Breath sounds: Normal breath sounds. No wheezing or rales.  Skin:    General: Skin is warm and dry.     Findings: No erythema or rash.  Neurological:     Mental Status: She is alert.  Psychiatric:        Mood and Affect: Mood normal.        Behavior: Behavior normal.        Thought Content: Thought content normal.        Judgment: Judgment normal.     Lab Review:     Component Value Date/Time   NA 133 (L) 01/16/2019 1235   NA 138 01/27/2017 0802   K 3.3 (L) 01/16/2019 1235   K 4.1 01/27/2017 0802   CL 96 (L) 01/16/2019 1235   CO2 29 01/16/2019 1235   CO2 26 01/27/2017 0802   GLUCOSE 137 (H)  01/16/2019 1235   GLUCOSE 90 01/27/2017 0802   BUN 12 01/16/2019 1235   BUN  14.0 01/27/2017 0802   CREATININE 0.79 01/16/2019 1235   CREATININE 0.7 01/27/2017 0802   CALCIUM 8.5 (L) 01/16/2019 1235   CALCIUM 8.6 01/27/2017 0802   PROT 6.6 01/16/2019 1235   PROT 6.5 01/27/2017 0802   ALBUMIN 3.4 (L) 01/16/2019 1235   ALBUMIN 3.5 01/27/2017 0802   AST 19 01/16/2019 1235   AST 22 01/27/2017 0802   ALT 21 01/16/2019 1235   ALT 23 01/27/2017 0802   ALKPHOS 80 01/16/2019 1235   ALKPHOS 85 01/27/2017 0802   BILITOT 0.4 01/16/2019 1235   BILITOT 0.23 01/27/2017 0802   GFRNONAA >60 01/16/2019 1235   GFRAA >60 01/16/2019 1235       Component Value Date/Time   WBC 0.8 (LL) 01/16/2019 1235   WBC 11.0 (H) 01/04/2019 1649   RBC 2.76 (L) 01/16/2019 1235   HGB 9.6 (L) 01/16/2019 1235   HGB 11.1 (L) 01/27/2017 0802   HCT 28.2 (L) 01/16/2019 1235   HCT 33.4 (L) 01/27/2017 0802   PLT 96 (L) 01/16/2019 1235   PLT 170 01/27/2017 0802   MCV 102.2 (H) 01/16/2019 1235   MCV 99.0 01/27/2017 0802   MCH 34.8 (H) 01/16/2019 1235   MCHC 34.0 01/16/2019 1235   RDW 14.8 01/16/2019 1235   RDW 18.2 (H) 01/27/2017 0802   LYMPHSABS 0.2 (L) 01/16/2019 1235   LYMPHSABS 1.7 01/27/2017 0802   MONOABS 0.2 01/16/2019 1235   MONOABS 0.5 01/27/2017 0802   EOSABS 0.0 01/16/2019 1235   EOSABS 0.0 01/27/2017 0802   BASOSABS 0.0 01/16/2019 1235   BASOSABS 0.0 01/27/2017 0802   -------------------------------  Imaging from last 24 hours (if applicable):  Radiology interpretation: MR THORACIC SPINE W WO CONTRAST  Result Date: 01/03/2019 CLINICAL DATA:  Worsening thoracic spine pain in a patient with metastatic breast cancer. EXAM: MRI THORACIC WITHOUT AND WITH CONTRAST TECHNIQUE: Multiplanar and multiecho pulse sequences of the thoracic spine were obtained without and with intravenous contrast. CONTRAST:  8 mL GADAVIST IV COMPARISON:  CT chest 12/09/2018. PET CT scan 08/02/2018. FINDINGS: MRI THORACIC SPINE FINDINGS Alignment:  Normal. Vertebrae: There is no fracture. An enhancing  lesion in the central aspect of T12 is consistent with a metastatic deposit measuring approximately 1.2 cm AP x 1.1 cm craniocaudal x 1.7 cm transverse. A 0.5 cm in diameter enhancing lesion is also seen in the right side of the T8 vertebral body. Benign hemangioma in T8 is noted. Increased T1 and T2 signal in the majority of T10, throughout T11 and the superior aspect of T12 is most consistent with prior radiation therapy. Cord:  Normal signal throughout. No epidural tumor. Paraspinal and other soft tissues: Dependent atelectasis noted. 0.4 cm in diameter T2 hyperintense focus in the posterior arc of the right third rib may also represent metastatic disease Disc levels: No disc bulge or protrusion. The central canal and foramina are widely patent at all levels. IMPRESSION: 1. Findings consistent with metastatic disease in the T12 and T8 vertebral bodies as described above. Negative for fracture. 2. Negative for central canal or foraminal stenosis. No epidural tumor. Electronically Signed   By: Inge Rise M.D.   On: 01/03/2019 15:42   CT ABDOMEN PELVIS W CONTRAST  Result Date: 01/01/2019 CLINICAL DATA:  Back pain. Metastatic breast cancer. Left flank pain. EXAM: CT ABDOMEN AND PELVIS WITH CONTRAST TECHNIQUE: Multidetector CT imaging of the abdomen and pelvis was performed using  the standard protocol following bolus administration of intravenous contrast. CONTRAST:  121mL OMNIPAQUE IOHEXOL 300 MG/ML  SOLN COMPARISON:  CT scan of the pelvis dated 12/11/2018 and PET-CT dated 08/02/2018 and chest CT dated 12/09/2018 no acute abnormalities. FINDINGS: Lower chest: There is a 18 x 13 mm metastasis in right middle lobe. This was visible on the prior study of 12/09/2018 when it measured 19 x 15 mm. Hepatobiliary: No focal liver abnormality is seen. No gallstones, gallbladder wall thickening, or biliary dilatation. Pancreas: Unremarkable. No pancreatic ductal dilatation or surrounding inflammatory changes. Spleen:  Normal in size without focal abnormality. Adrenals/Urinary Tract: Stable 9 mm low-density nodule in the left adrenal gland. Right adrenal gland is normal. Kidneys are normal. No hydronephrosis. Bladder is normal. Stomach/Bowel: Stomach is within normal limits. Appendix appears normal. No evidence of bowel wall thickening, distention, or inflammatory changes. Vascular/Lymphatic: No significant vascular findings are present. No enlarged abdominal or pelvic lymph nodes. Reproductive: Uterus and bilateral adnexa are unremarkable. Other: No abdominal wall hernia or abnormality. No abdominopelvic ascites. Musculoskeletal: The patient has multiple small bone metastases which are progressed since the PET-CT of 08/02/2018. There is a healing fracture of the posterolateral aspect of the right eighth rib. There subtle lesions in the lateral aspect of the T9 vertebral body and in the central portion of the T12 vertebral body. There is a subtle lesion in the left side of the L3 vertebral body as well as a tiny lesion in the left transverse process of L4 and a 24 x 14 mm lesion in the left side of the S1 segment of the sacrum as well as a 10 mm lesion in the left ilium adjacent to the left SI joint. There is a lytic lesion in the right inferior pubic ramus. 9 mm lesion in the right sacral ala. No discrete muscle abnormality. No visible tumor in the spinal canal. IMPRESSION: 1. No acute abnormality of the abdomen or pelvis. 2. No significant change in the metastasis in the right middle lobe. 3. Multiple small bone metastases which are progressed since the PET-CT of 08/02/2018 but essentially unchanged since the pelvic CT of 12/11/2018 and chest CT of 12/09/2018. 4. Healing fracture of the posterolateral aspect of the right eighth rib. Electronically Signed   By: Lorriane Shire M.D.   On: 01/01/2019 15:04        This patient was seen with Dr. Lindi Adie with my treatment plan reviewed with him. He expressed agreement with my  medical management of this patient.  Attending Note  I personally saw and examined Donata Duff. The plan of care was discussed with her. I agree with the assessment and plan as documented above. Neutropenic fever: We went back and forth about hospitalization and determined that we can best manage her as an outpatient.  She received Granix today along with IV antibiotics.  She was feeling much better so we decided to let her go home to be coming on a daily basis for injections and supportive care. We instructed her to go to the emergency room if she were to feel worse. Patient is very competent and was willing to follow these directions. We sent a prescription for levofloxacin. Signed Harriette Ohara, MD

## 2019-01-16 NOTE — Progress Notes (Signed)
Dr. Lindi Adie received call from Dr. [name] in radiation.  States pt does not look well and needs to be evaluated by [facility] for possible fluids.  Per Dr. Lindi Adie, schedule pt for lab work to be drawn and follow up with Sandi Mealy in [facility]  Clarise Cruz, RN notified and high priority message sent to scheduling.

## 2019-01-17 ENCOUNTER — Other Ambulatory Visit: Payer: Self-pay

## 2019-01-17 ENCOUNTER — Ambulatory Visit
Admission: RE | Admit: 2019-01-17 | Discharge: 2019-01-17 | Disposition: A | Discharge status: Home / Self Care | Payer: 59 | Source: Ambulatory Visit | Attending: Radiation Oncology | Admitting: Radiation Oncology

## 2019-01-17 ENCOUNTER — Inpatient Hospital Stay: Payer: 59 | Admitting: Medical

## 2019-01-17 ENCOUNTER — Inpatient Hospital Stay: Payer: 59

## 2019-01-17 VITALS — BP 127/75 | HR 90 | Temp 98.5°F | Resp 18 | Ht 67.0 in

## 2019-01-17 DIAGNOSIS — C50512 Malignant neoplasm of lower-outer quadrant of left female breast: Secondary | ICD-10-CM | POA: Diagnosis not present

## 2019-01-17 DIAGNOSIS — Z95828 Presence of other vascular implants and grafts: Secondary | ICD-10-CM

## 2019-01-17 DIAGNOSIS — C7951 Secondary malignant neoplasm of bone: Secondary | ICD-10-CM

## 2019-01-17 DIAGNOSIS — Z171 Estrogen receptor negative status [ER-]: Secondary | ICD-10-CM

## 2019-01-17 DIAGNOSIS — R5081 Fever presenting with conditions classified elsewhere: Secondary | ICD-10-CM

## 2019-01-17 DIAGNOSIS — D709 Neutropenia, unspecified: Secondary | ICD-10-CM

## 2019-01-17 DIAGNOSIS — G893 Neoplasm related pain (acute) (chronic): Secondary | ICD-10-CM

## 2019-01-17 DIAGNOSIS — Z7189 Other specified counseling: Secondary | ICD-10-CM

## 2019-01-17 DIAGNOSIS — Z510 Encounter for antineoplastic radiation therapy: Secondary | ICD-10-CM | POA: Diagnosis not present

## 2019-01-17 DIAGNOSIS — E876 Hypokalemia: Secondary | ICD-10-CM

## 2019-01-17 DIAGNOSIS — E039 Hypothyroidism, unspecified: Secondary | ICD-10-CM

## 2019-01-17 LAB — CBC WITH DIFFERENTIAL (CANCER CENTER ONLY)
Abs Immature Granulocytes: 0.1 10*3/uL — ABNORMAL HIGH (ref 0.00–0.07)
Band Neutrophils: 15 %
Basophils Absolute: 0 10*3/uL (ref 0.0–0.1)
Basophils Relative: 0 %
Eosinophils Absolute: 0 10*3/uL (ref 0.0–0.5)
Eosinophils Relative: 0 %
HCT: 29.3 % — ABNORMAL LOW (ref 36.0–46.0)
Hemoglobin: 9.9 g/dL — ABNORMAL LOW (ref 12.0–15.0)
Lymphocytes Relative: 20 %
Lymphs Abs: 0.3 10*3/uL — ABNORMAL LOW (ref 0.7–4.0)
MCH: 34.7 pg — ABNORMAL HIGH (ref 26.0–34.0)
MCHC: 33.8 g/dL (ref 30.0–36.0)
MCV: 102.8 fL — ABNORMAL HIGH (ref 80.0–100.0)
Metamyelocytes Relative: 3 %
Monocytes Absolute: 0.4 10*3/uL (ref 0.1–1.0)
Monocytes Relative: 22 %
Myelocytes: 2 %
Neutro Abs: 0.8 10*3/uL — ABNORMAL LOW (ref 1.7–7.7)
Neutrophils Relative %: 38 %
Platelet Count: 84 10*3/uL — ABNORMAL LOW (ref 150–400)
RBC: 2.85 MIL/uL — ABNORMAL LOW (ref 3.87–5.11)
RDW: 14.9 % (ref 11.5–15.5)
WBC Count: 1.6 10*3/uL — ABNORMAL LOW (ref 4.0–10.5)
nRBC: 5 % — ABNORMAL HIGH (ref 0.0–0.2)

## 2019-01-17 LAB — CMP (CANCER CENTER ONLY)
ALT: 21 U/L (ref 0–44)
AST: 17 U/L (ref 15–41)
Albumin: 3.3 g/dL — ABNORMAL LOW (ref 3.5–5.0)
Alkaline Phosphatase: 87 U/L (ref 38–126)
Anion gap: 13 (ref 5–15)
BUN: 12 mg/dL (ref 6–20)
CO2: 26 mmol/L (ref 22–32)
Calcium: 8.6 mg/dL — ABNORMAL LOW (ref 8.9–10.3)
Chloride: 96 mmol/L — ABNORMAL LOW (ref 98–111)
Creatinine: 0.77 mg/dL (ref 0.44–1.00)
GFR, Est AFR Am: >60 mL/min (ref >60)
GFR, Estimated: >60 mL/min (ref >60)
Glucose, Bld: 115 mg/dL — ABNORMAL HIGH (ref 70–99)
Potassium: 3.6 mmol/L (ref 3.5–5.1)
Sodium: 135 mmol/L (ref 135–145)
Total Bilirubin: 0.5 mg/dL (ref 0.3–1.2)
Total Protein: 7 g/dL (ref 6.5–8.1)

## 2019-01-17 LAB — URINE CULTURE: Culture: NO GROWTH

## 2019-01-17 LAB — TSH: TSH: 1.183 u[IU]/mL (ref 0.308–3.960)

## 2019-01-17 MED ORDER — FILGRASTIM-SNDZ 300 MCG/0.5ML IJ SOSY
PREFILLED_SYRINGE | INTRAMUSCULAR | Status: AC
  Filled 2019-01-17: qty 0.5

## 2019-01-17 MED ORDER — SODIUM CHLORIDE 0.9 % IV SOLN
Freq: Once | INTRAVENOUS | Status: AC
  Filled 2019-01-17: qty 1000

## 2019-01-17 MED ORDER — SODIUM CHLORIDE 0.9% FLUSH
10.0000 mL | INTRAVENOUS | Status: DC | PRN
  Administered 2019-01-17: 10 mL via INTRAVENOUS
  Filled 2019-01-17: qty 10

## 2019-01-17 MED ORDER — HEPARIN SOD (PORK) LOCK FLUSH 100 UNIT/ML IV SOLN
500.0000 [IU] | Freq: Once | INTRAVENOUS | Status: AC | PRN
  Administered 2019-01-17: 500 [IU] via INTRAVENOUS
  Filled 2019-01-17: qty 5

## 2019-01-17 MED ORDER — GABAPENTIN 300 MG PO CAPS: 600.0000 mg | ORAL_CAPSULE | Freq: Every day | ORAL | 5 refills | Status: DC

## 2019-01-17 MED ORDER — FILGRASTIM-SNDZ 300 MCG/0.5ML IJ SOSY
300.0000 ug | PREFILLED_SYRINGE | Freq: Once | INTRAMUSCULAR | Status: AC
  Administered 2019-01-17: 15:00:00 300 ug via SUBCUTANEOUS

## 2019-01-17 MED ORDER — MORPHINE SULFATE ER 60 MG PO TBCR: 60.0000 mg | EXTENDED_RELEASE_TABLET | Freq: Two times a day (BID) | ORAL | 0 refills | Status: DC

## 2019-01-17 MED ORDER — SODIUM CHLORIDE 0.9 % IV SOLN
Freq: Once | INTRAVENOUS | Status: AC
  Filled 2019-01-17: qty 250

## 2019-01-17 NOTE — Patient Instructions (Signed)
Dehydration, Adult  Dehydration is when there is not enough fluid or water in your body. This happens when you lose more fluids than you take in. Dehydration can range from mild to very bad. It should be treated right away to keep it from getting very bad. Symptoms of mild dehydration may include:  Thirst.  Dry lips.  Slightly dry mouth.  Dry, warm skin.  Dizziness. Symptoms of moderate dehydration may include:  Very dry mouth.  Muscle cramps.  Dark pee (urine). Pee may be the color of tea.  Your body making less pee.  Your eyes making fewer tears.  Heartbeat that is uneven or faster than normal (palpitations).  Headache.  Light-headedness, especially when you stand up from sitting.  Fainting (syncope). Symptoms of very bad dehydration may include:  Changes in skin, such as: ? Cold and clammy skin. ? Blotchy (mottled) or pale skin. ? Skin that does not quickly return to normal after being lightly pinched and let go (poor skin turgor).  Changes in body fluids, such as: ? Feeling very thirsty. ? Your eyes making fewer tears. ? Not sweating when body temperature is high, such as in hot weather. ? Your body making very little pee.  Changes in vital signs, such as: ? Weak pulse. ? Pulse that is more than 100 beats a minute when you are sitting still. ? Fast breathing. ? Low blood pressure.  Other changes, such as: ? Sunken eyes. ? Cold hands and feet. ? Confusion. ? Lack of energy (lethargy). ? Trouble waking up from sleep. ? Short-term weight loss. ? Unconsciousness. Follow these instructions at home:   If told by your doctor, drink an ORS: ? Make an ORS by using instructions on the package. ? Start by drinking small amounts, about  cup (120 mL) every 5-10 minutes. ? Slowly drink more until you have had the amount that your doctor said to have.  Drink enough clear fluid to keep your pee clear or pale yellow. If you were told to drink an ORS, finish the  ORS first, then start slowly drinking clear fluids. Drink fluids such as: ? Water. Do not drink only water by itself. Doing that can make the salt (sodium) level in your body get too low (hyponatremia). ? Ice chips. ? Fruit juice that you have added water to (diluted). ? Low-calorie sports drinks.  Avoid: ? Alcohol. ? Drinks that have a lot of sugar. These include high-calorie sports drinks, fruit juice that does not have water added, and soda. ? Caffeine. ? Foods that are greasy or have a lot of fat or sugar.  Take over-the-counter and prescription medicines only as told by your doctor.  Do not take salt tablets. Doing that can make the salt level in your body get too high (hypernatremia).  Eat foods that have minerals (electrolytes). Examples include bananas, oranges, potatoes, tomatoes, and spinach.  Keep all follow-up visits as told by your doctor. This is important. Contact a doctor if:  You have belly (abdominal) pain that: ? Gets worse. ? Stays in one area (localizes).  You have a rash.  You have a stiff neck.  You get angry or annoyed more easily than normal (irritability).  You are more sleepy than normal.  You have a harder time waking up than normal.  You feel: ? Weak. ? Dizzy. ? Very thirsty.  You have peed (urinated) only a small amount of very dark pee during 6-8 hours. Get help right away if:  You have   symptoms of very bad dehydration.  You cannot drink fluids without throwing up (vomiting).  Your symptoms get worse with treatment.  You have a fever.  You have a very bad headache.  You are throwing up or having watery poop (diarrhea) and it: ? Gets worse. ? Does not go away.  You have blood or something green (bile) in your throw-up.  You have blood in your poop (stool). This may cause poop to look black and tarry.  You have not peed in 6-8 hours.  You pass out (faint).  Your heart rate when you are sitting still is more than 100 beats a  minute.  You have trouble breathing. This information is not intended to replace advice given to you by your health care provider. Make sure you discuss any questions you have with your health care provider. Document Released: 11/13/2008 Document Revised: 12/30/2016 Document Reviewed: 03/13/2015 Elsevier Patient Education  2020 Elsevier Inc.  

## 2019-01-17 NOTE — Progress Notes (Signed)
K = 3.6 today, up from 3.3 today.  No KCl needed today per Sandi Mealy, PA Pt will just get IVF w/ NS.  Kennith Center, Pharm.D., CPP 01/17/2019@1 :05 PM

## 2019-01-18 ENCOUNTER — Inpatient Hospital Stay (HOSPITAL_BASED_OUTPATIENT_CLINIC_OR_DEPARTMENT_OTHER): Payer: 59 | Admitting: Medical

## 2019-01-18 ENCOUNTER — Ambulatory Visit
Admission: RE | Admit: 2019-01-18 | Discharge: 2019-01-18 | Disposition: A | Discharge status: Home / Self Care | Payer: 59 | Source: Ambulatory Visit | Attending: Radiation Oncology | Admitting: Radiation Oncology

## 2019-01-18 ENCOUNTER — Other Ambulatory Visit: Payer: Self-pay

## 2019-01-18 ENCOUNTER — Other Ambulatory Visit: Payer: Self-pay | Admitting: *Deleted

## 2019-01-18 ENCOUNTER — Inpatient Hospital Stay: Payer: 59

## 2019-01-18 ENCOUNTER — Ambulatory Visit: Payer: 59

## 2019-01-18 VITALS — BP 107/76 | HR 108 | Temp 98.2°F | Resp 19 | Ht 67.0 in | Wt 175.5 lb

## 2019-01-18 DIAGNOSIS — C50512 Malignant neoplasm of lower-outer quadrant of left female breast: Secondary | ICD-10-CM

## 2019-01-18 DIAGNOSIS — R5081 Fever presenting with conditions classified elsewhere: Secondary | ICD-10-CM

## 2019-01-18 DIAGNOSIS — D709 Neutropenia, unspecified: Secondary | ICD-10-CM

## 2019-01-18 DIAGNOSIS — Z510 Encounter for antineoplastic radiation therapy: Secondary | ICD-10-CM | POA: Diagnosis not present

## 2019-01-18 DIAGNOSIS — Z171 Estrogen receptor negative status [ER-]: Secondary | ICD-10-CM

## 2019-01-18 DIAGNOSIS — C7951 Secondary malignant neoplasm of bone: Secondary | ICD-10-CM

## 2019-01-18 LAB — CBC WITH DIFFERENTIAL (CANCER CENTER ONLY)
Abs Immature Granulocytes: 0.05 10*3/uL (ref 0.00–0.07)
Basophils Absolute: 0 10*3/uL (ref 0.0–0.1)
Basophils Relative: 0 %
Eosinophils Absolute: 0 10*3/uL (ref 0.0–0.5)
Eosinophils Relative: 0 %
HCT: 30 % — ABNORMAL LOW (ref 36.0–46.0)
Hemoglobin: 10 g/dL — ABNORMAL LOW (ref 12.0–15.0)
Immature Granulocytes: 2 %
Lymphocytes Relative: 14 %
Lymphs Abs: 0.4 10*3/uL — ABNORMAL LOW (ref 0.7–4.0)
MCH: 35 pg — ABNORMAL HIGH (ref 26.0–34.0)
MCHC: 33.3 g/dL (ref 30.0–36.0)
MCV: 104.9 fL — ABNORMAL HIGH (ref 80.0–100.0)
Monocytes Absolute: 0.4 10*3/uL (ref 0.1–1.0)
Monocytes Relative: 13 %
Neutro Abs: 2 10*3/uL (ref 1.7–7.7)
Neutrophils Relative %: 71 %
Platelet Count: 68 10*3/uL — ABNORMAL LOW (ref 150–400)
RBC: 2.86 MIL/uL — ABNORMAL LOW (ref 3.87–5.11)
RDW: 15.3 % (ref 11.5–15.5)
WBC Count: 2.8 10*3/uL — ABNORMAL LOW (ref 4.0–10.5)
nRBC: 4.6 % — ABNORMAL HIGH (ref 0.0–0.2)

## 2019-01-18 NOTE — Progress Notes (Signed)
error 

## 2019-01-21 ENCOUNTER — Encounter: Payer: Self-pay | Admitting: Radiation Oncology

## 2019-01-21 ENCOUNTER — Ambulatory Visit
Admission: RE | Admit: 2019-01-21 | Discharge: 2019-01-21 | Disposition: A | Discharge status: Home / Self Care | Payer: 59 | Source: Ambulatory Visit | Attending: Radiation Oncology | Admitting: Radiation Oncology

## 2019-01-21 ENCOUNTER — Other Ambulatory Visit: Payer: Self-pay

## 2019-01-21 DIAGNOSIS — Z510 Encounter for antineoplastic radiation therapy: Secondary | ICD-10-CM | POA: Diagnosis not present

## 2019-01-21 LAB — CULTURE, BLOOD (SINGLE)
Culture: NO GROWTH
Culture: NO GROWTH

## 2019-01-21 NOTE — Progress Notes (Signed)
Ms. Petree returns after being seen yesterday. She is feeling some better. She has generalized body aches but is afebrile. She continues to take Tylenol. She will again receive IV normal saline and Zarxio. She knows to present to the ER or call 911 should she have a recurrent fever or worsening symptoms tonight. She will otherwise return tomorrow for additional IV fluids and for possible dosing with Vevelyn Francois again if indicated.  Sandi Mealy, MHS, PA-C Physician Assistant

## 2019-01-21 NOTE — Progress Notes (Addendum)
I saw the patient following her Tspine/left rib treatment completed today. She has had increasing pain at the site in the last 24-48 hours. She does think she may have pulled something as she was having a vivid dream where she was wrestling. She is taking Neurontin 600 mg at night, as well as Morphine ER 60 mg BID, and oxycodone 10 mg prn. She reports the Neurontin is really for her peripheral neuropathy. Her pain is described as constant, shooting and sharp in nature. It hurts when she coughs, takes a deep breath, or hiccups. I suspect she may have a rib fracture. We discussed the options of diagnostic imaging though this is quite possible given the location of disease and prior history of rib fracture to another diseased rib. She agrees that the imaging doesn't solve the problem, only confirm our suspicion. I will d/w Dr. [name] too if he can seen any evidence on her weekly films of a fracture. For now we will increase her frequency of Neurontin to 300 mg q am, and q midday, as well as her 600 mg at bedtime that she's already taking. She is due to see Dr. Lindi Adie on Thursday. If her symptoms worsen she will let us know as well. My other thought would be to have her see Dr. Maryjean Ka in interventional anesthesia.     Carola Rhine, PAC

## 2019-01-21 NOTE — Progress Notes (Signed)
Laura Anthony is feeling much improved today. She is eating and drinking better and does not want IV fluids today. A CBC returned today with a WBC of 2.8 and an ANC of 2.0. She does not require additional Granix. Signs and symptoms of infection/sepsis were reviewed. She knows to return as needed.  Sandi Mealy, MHS, PA-C Physician Assistant

## 2019-01-21 NOTE — Telephone Encounter (Signed)
Please see other note dated same day.

## 2019-01-23 NOTE — Progress Notes (Signed)
Patient Care Team: Laura Noon, MD as PCP - General (Family Medicine) Laura Kaufmann, RN as Oncology Nurse Navigator Rockwell Germany, RN as Oncology Nurse Navigator  DIAGNOSIS:    ICD-10-CM   1. Malignant neoplasm of lower-outer quadrant of left breast of female, estrogen receptor negative (Laura Anthony)  C50.512    Z17.1     SUMMARY OF ONCOLOGIC HISTORY: 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, estrogen receptor negative (Laura Anthony)  10/20/2016 Mammogram   Mammogram and ultrasound of the left breast revealed 1.7 cm mass at 4:00 position, 6:30 position 5 x 4 x 4 mm mass, 6:00 position 5 cm nipple 7 x 6 x 11 mm, left axillary lymph node with thickened cortex, T1c N1 stage II a AJCC 8   10/24/2016 Initial Diagnosis   Left breast biopsy 6:30 position 3 cm from nipple: IDC grade 2, DCIS, ER 0%, PR 0%, Ki-67 15% HER-2 positive ratio 2.1; 4:00 position 3 cm from nipple: IDC grade 2, DCIS, ER 0%, PR 0%, Ki-67 35%, HER-2 positive ratio 2.02; left axillary lymph node biopsy positive   11/04/2016 - 02/17/2017 Neo-Adjuvant Chemotherapy   TCH Perjeta 6 cycles followed by Herceptin + Perjeta maintenance to be completed September 2019   11/30/2016 Genetic Testing   Negative genetic testing on the common hereditary cancer panel.  The Hereditary Gene Panel offered by [facility] includes sequencing and/or deletion duplication testing of the following 47 genes: APC, ATM, AXIN2, BARD1, BMPR1A, BRCA1, BRCA2, BRIP1, CDH1, CDK4, CDKN2A (p14ARF), CDKN2A (p16INK4a), CHEK2, CTNNA1, DICER1, EPCAM (Deletion/duplication testing only), GREM1 (promoter region deletion/duplication testing only), KIT, MEN1, MLH1, MSH2, MSH3, MSH6, MUTYH, NBN, NF1, NHTL1, PALB2, PDGFRA, PMS2, POLD1, POLE, PTEN, RAD50, RAD51C, RAD51D, SDHB, SDHC, SDHD, SMAD4, SMARCA4. STK11, TP53, TSC1, TSC2, and VHL.  The following genes were evaluated for sequence changes only: SDHA and HOXB13 c.251G>A variant only. The  report date is November 30, 2016.    03/27/2017 Surgery   Bilateral mastectomies: Left mastectomy: IDC grade 2 0.9 cm, nodes negative, right mastectomy benign, ER 0%, PR 0%, HER-2 positive ratio 2.6   05/08/2017 - 06/09/2017 Radiation Therapy   Adjuvant radiation therapy   10/23/2017 Miscellaneous   Neratinib discontinued after 4 weeks for severe diarrhea   07/25/2018 Relapse/Recurrence   MRI of right elbow showed bone lesion consistent with malignancy. PET scan showed bilateral pulmonary nodules and several lytic bone lesions compatible with metastatic disease. Brain MRI on 08/02/18 showed no evidence of metastatic disease.   08/02/2018 PET scan   Bilateral hypermetabolic lung nodules, LUL 1.3 cm with SUV 3.88, lingular nodule 1.4 cm SUV 3.7, central lingular nodule 1.2 cm SUV 9.76, right middle lobe nodule 1.5 cm SUV 9.9, lytic bone metastases inferior pubic ramus, sacrum, T12, right 11th rib.   08/08/2018 Procedure   Lung biopsy: metastatic carcinoma, HER-2 negative (0), ER/PR negative.   08/10/2018 -  Radiation Therapy   Palliative radiatio to the right humerus along the medial condyle   08/24/2018 - 09/05/2018 Radiation Therapy   Palliative radiation to the right 11th rib and right elbow   09/26/2018 - 12/04/2018 Chemotherapy   Carboplatin atezolizumab at [name] with Dr. Janan Anthony on [place] clinical trial stopped because of new T5 metastases (toxicities included myopathy required prednisone, immune mediated thyroiditis), right upper extremity DVT on apixaban   12/14/2018 -  Chemotherapy   The patient had pegfilgrastim (NEULASTA) injection 6 mg, 6 mg, Subcutaneous, Once, 0 of 1 cycle eriBULin mesylate (  HALAVEN) 2.7 mg in sodium chloride 0.9 % 100 mL chemo infusion, 1.42 mg/m2 = 2.65 mg, Intravenous,  Once, 2 of 4 cycles Dose modification: 1.1 mg/m2 (original dose 1.4 mg/m2, Cycle 2, Reason: Dose not tolerated, Comment: neutropenic fever) Administration: 2.7 mg (12/14/2018), 2.7 mg  (12/21/2018), 2 mg (01/03/2019), 2 mg (01/10/2019)  for chemotherapy treatment.    12/17/2018 -  Radiation Therapy   Palliative radiation to sternal, sacral & pelvic lesions and SRS for T3 and C7-T1 lesions.   Bone metastases ([name])  08/07/2018 Initial Diagnosis   Bone metastases ([name])   12/14/2018 -  Chemotherapy   The patient had pertuzumab (PERJETA) 420 mg in sodium chloride 0.9 % 250 mL chemo infusion, 420 mg (100 % of original dose 420 mg), Intravenous, Once, 2 of 6 cycles Dose modification: 420 mg (original dose 420 mg, Cycle 1, Reason: Provider Judgment) Administration: 420 mg (12/14/2018), 420 mg (01/10/2019) trastuzumab-dkst (OGIVRI) 600 mg in sodium chloride 0.9 % 250 mL chemo infusion, 609 mg, Intravenous,  Once, 2 of 6 cycles Administration: 600 mg (12/14/2018), 450 mg (01/10/2019)  for chemotherapy treatment.    12/17/2018 -  Radiation Therapy   Palliative radiation to sternal, sacral & pelvic lesions and SRS for T3 and C7-T1 lesions.     CHIEF COMPLIANT: Follow-up of metastatic breast cancer onHalaven with Herceptin and Perjeta  INTERVAL HISTORY: Laura Anthony is a 43 y.o. with above-mentioned history of metastatic breast cancerwith metastases to the lungs and bone. She is currently on treatment withHalaven with Herceptin and Perjeta.She presents to the clinic todayfor treatment. Patient is complaining of intractable left rib cage pain especially when she takes a deep breath or coughs or hiccups.  Pain is not very well controlled with the current regimen.  She had lengthy conversations with radiation oncology and Laura Anthony got her started on methadone.  This will be started later today.  She is also following up with palliative care Dr. Hilma Anthony.  REVIEW OF SYSTEMS:   Constitutional: Denies fevers, chills or abnormal weight loss, complaining of severe pain in the left rib cage especially when she takes a deep breath.  It is sharp and uncomfortable to her.  It  can be 10 out of 10 when it is at its peak. Eyes: Denies blurriness of vision Ears, nose, mouth, throat, and face: Denies mucositis or sore throat Respiratory: Denies cough, dyspnea or wheezes Cardiovascular: Denies palpitation, chest discomfort Gastrointestinal: Denies nausea, heartburn or change in bowel habits Skin: Denies abnormal skin rashes Lymphatics: Denies new lymphadenopathy or easy bruising Neurological: Denies numbness, tingling or new weaknesses Behavioral/Psych: Mood is stable, no new changes  Extremities: No lower extremity edema Breast: denies any pain or lumps or nodules in either breasts All other systems were reviewed with the patient and are negative.  I have reviewed the past medical history, past surgical history, social history and family history with the patient and they are unchanged from previous note.  ALLERGIES:  is allergic to sumatriptan; statins; and tape.  MEDICATIONS:  Current Outpatient Medications  Medication Sig Dispense Refill  . dexamethasone (DECADRON) 1 MG tablet Take 1 tablet (1 mg total) by mouth daily. 30 tablet 0  . ELIQUIS 5 MG TABS tablet Take 5 mg by mouth 2 (two) times daily.    Marland Kitchen FLUoxetine (PROZAC) 20 MG capsule Take 1 capsule (20 mg total) by mouth daily. 90 capsule 0  . FLUoxetine (PROZAC) 40 MG capsule Take 40 mg by mouth every morning.     . gabapentin (  NEURONTIN) 300 MG capsule Take 2 capsules (600 mg total) by mouth at bedtime. 60 capsule 5  . levofloxacin (LEVAQUIN) 750 MG tablet Take 1 tablet (750 mg total) by mouth daily. 7 tablet 0  . levothyroxine (SYNTHROID) 88 MCG tablet TAKE 1 TABLET (88 MCG TOTAL) BY MOUTH DAILY BEFORE BREAKFAST. 30 tablet 1  . lidocaine (XYLOCAINE) 2 % solution Use as directed 10 mLs in the mouth or throat every 6 (six) hours as needed for mouth pain. (Patient not taking: Reported on 01/10/2019) 450 mL 1  . lidocaine-prilocaine (EMLA) cream APPLY 1 APPLICATION TOPICALLY AS NEEDED. 30 g 0  .  lisdexamfetamine (VYVANSE) 30 MG capsule Take 30 mg by mouth every morning.     . methadone (DOLOPHINE) 5 MG tablet Take 1 tablet (5 mg total) by mouth every 8 (eight) hours. 92 tablet 0  . morphine (MS CONTIN) 60 MG 12 hr tablet Take 1 tablet (60 mg total) by mouth every 12 (twelve) hours. 60 tablet 0  . naloxone (NARCAN) 4 MG/0.1ML LIQD nasal spray kit Spray once in one nostril prn overdose, may repeat x 1 1 each 1  . ondansetron (ZOFRAN) 8 MG tablet Take 1 tablet (8 mg total) by mouth 2 (two) times daily as needed (Nausea or vomiting). 30 tablet 1  . pantoprazole (PROTONIX) 40 MG tablet Take 1 tablet (40 mg total) by mouth 2 (two) times daily. 60 tablet 0  . polyethylene glycol (MIRALAX / GLYCOLAX) 17 g packet Take 17 g by mouth daily as needed for mild constipation or moderate constipation. 14 each 0  . prochlorperazine (COMPAZINE) 10 MG tablet Take 1 tablet (10 mg total) by mouth every 6 (six) hours as needed (Nausea or vomiting). 30 tablet 1  . rizatriptan (MAXALT) 10 MG tablet Take 10 mg by mouth daily as needed for migraine (and may repeat once in 2 hours, if no relief).     Marland Kitchen senna-docusate (SENOKOT-S) 8.6-50 MG tablet Take 1 tablet by mouth 2 (two) times daily. (Patient taking differently: Take 1 tablet by mouth 2 (two) times daily as needed for moderate constipation. ) 60 tablet 0  . spironolactone (ALDACTONE) 100 MG tablet Take 100 mg by mouth daily.     . sucralfate (CARAFATE) 1 g tablet Take 1 tablet by mouth 4 (four) times daily as needed (for indigestion). Crush and mix with 1oz of water and take before meals and at bedtime    . traZODone (DESYREL) 50 MG tablet Take 1 tablet (50 mg total) by mouth at bedtime as needed for sleep. (Patient taking differently: Take 50 mg by mouth at bedtime. ) 90 tablet 1   No current facility-administered medications for this visit.    PHYSICAL EXAMINATION: ECOG PERFORMANCE STATUS: 2 - Symptomatic, <50% confined to bed  Vitals:   01/24/19 1137    BP: 110/68  Pulse: (!) 106  Resp: 18  Temp: 98.7 F (37.1 C)  SpO2: 92%   Filed Weights   01/24/19 1137  Weight: 182 lb 3.2 oz (82.6 kg)    GENERAL: alert, no distress and comfortable SKIN: skin color, texture, turgor are normal, no rashes or significant lesions EYES: normal, Conjunctiva are pink and non-injected, sclera clear OROPHARYNX: no exudate, no erythema and lips, buccal mucosa, and tongue normal  NECK: supple, thyroid normal size, non-tender, without nodularity LYMPH: no palpable lymphadenopathy in the cervical, axillary or inguinal LUNGS: clear to auscultation and percussion with normal breathing effort HEART: regular rate & rhythm and no murmurs and no  lower extremity edema ABDOMEN: abdomen soft, non-tender and normal bowel sounds MUSCULOSKELETAL: no cyanosis of digits and no clubbing  NEURO: alert & oriented x 3 with fluent speech, no focal motor/sensory deficits EXTREMITIES: No lower extremity edema  LABORATORY DATA:  I have reviewed the data as listed CMP Latest Ref Rng & Units 01/24/2019 01/17/2019 01/16/2019  Glucose 70 - 99 mg/dL 127(H) 115(H) 137(H)  BUN 6 - 20 mg/dL 9 12 12   Creatinine 0.44 - 1.00 mg/dL 0.72 0.77 0.79  Sodium 135 - 145 mmol/L 135 135 133(L)  Potassium 3.5 - 5.1 mmol/L 3.4(L) 3.6 3.3(L)  Chloride 98 - 111 mmol/L 100 96(L) 96(L)  CO2 22 - 32 mmol/L 27 26 29   Calcium 8.9 - 10.3 mg/dL 7.9(L) 8.6(L) 8.5(L)  Total Protein 6.5 - 8.1 g/dL 6.6 7.0 6.6  Total Bilirubin 0.3 - 1.2 mg/dL 0.4 0.5 0.4  Alkaline Phos 38 - 126 U/L 92 87 80  AST 15 - 41 U/L 25 17 19   ALT 0 - 44 U/L 20 21 21     Lab Results  Component Value Date   WBC 3.8 (L) 01/24/2019   HGB 8.5 (L) 01/24/2019   HCT 25.7 (L) 01/24/2019   MCV 104.5 (H) 01/24/2019   PLT 46 (L) 01/24/2019   NEUTROABS 3.3 01/24/2019    ASSESSMENT & PLAN:  Malignant neoplasm of lower-outer quadrant of left breast of female, estrogen receptor negative (HCC) Mammogram and ultrasound of the left  breast revealed 1.7 cm mass at 4:00 position, 6:30 position 5 x 4 x 4 mm mass, 6:00 position 5 cm nipple 7 x 6 x 11 mm, left axillary lymph node with thickened cortex, T1c N1 stage II a AJCC 8  10/24/2016: Left breast biopsy 6:30 position 3 cm from nipple: IDC grade 2, DCIS, ER 0%, PR 0%, Ki-67 15% HER-2 positive ratio 2.1; 4:00 position 3 cm from nipple: IDC grade 2, DCIS, ER 0%, PR 0%, Ki-67 35%, HER-2 positive ratio 2.02 Lymph node biopsy positive  Treatment Summary: 1. Neoadjuvant chemotherapy with TCHPcompleted 02/17/2017 this would be followed by Herceptinand Perjetamaintenance for 1 yearcompleted September 2019 2.Bilateral mastectomies 03/28/2016:Bilateral mastectomies: Left mastectomy: IDC grade 2 0.9 cm, nodes negative, right mastectomy benign, ER 0%, PR 0%, HER-2 positive ratio 2.6 3.Adjuvant radiation4/08/2017 to 06/09/2017 4.Neratinib started 10/12/2017 discontinued due to diarrhea 5. Elbow fracture: Due to metastatic disease, palliative radiation therapy 6. Carboplatin atezolizumab at [facility] on a clinical trial [facility] 043 stopped for progression 12/04/2018 ---------------------------------------------------------------------------------------------------------------- Lung nodule biopsy: Metastatic breast cancer triple negative  CT CAP 12/03/2018: Progression of lung nodules and bone metastases done at [facility] Current treatment: Halaven days 1 and 8 every 3 weeks with Herceptin and Perjeta every 3 weeks, today cycle 2 day 8 [place] Chemo toxicities: 1. Hospitalization:  November 2020: Neutropenic fever at [facility] 01/02/2019-01/06/2019: Hospitalization for intractable back pain, MRI thoracic spine 01/03/2019: T12 and T8 metastatic disease. CT abdomen and pelvis: 01/01/2019: No significant change in the right middle lobe metastases. Multiple bone metastases progressed. Healing fracture right eighth rib 2. Neutropenic fever: Halaven dose reduced After cycle 2  she had neutropenic fever again.  This time we did not hospitalize her. Udenyca on day 9. 3.  Thrombocytopenia platelet count only 46: We are holding today's treatment. She will come back next week to receive Hal[place] with Herceptin and Perjeta.  We will give her Udenyca after the [place].  Intractable pain issues: Currently narcotic pain regimen.   Gabapentin was increased yesterday by radiation oncology  which I agree.  Duration oncology started her on methadone.  We will give her 2 mg of Dilaudid IV for immediate pain control. If she needs additional IV pain medication she could come back to see [place] for that.  We will request Dr. Maryjean Ka to see the patient for possible nerve block.  After the next cycle she will undergo scans for assessment of disease.  No orders of the defined types were placed in this encounter.  The patient has a good understanding of the overall plan. she agrees with it. she will call with any problems that may develop before the next visit here.  Nicholas Lose, MD 01/24/2019  Julious Oka Dorshimer, am acting as scribe for Dr. Nicholas Lose.  I have reviewed the above document for accuracy and completeness, and I agree with the above.

## 2019-01-24 ENCOUNTER — Inpatient Hospital Stay: Payer: 59

## 2019-01-24 ENCOUNTER — Telehealth: Payer: Self-pay | Admitting: Radiation Oncology

## 2019-01-24 ENCOUNTER — Inpatient Hospital Stay (HOSPITAL_BASED_OUTPATIENT_CLINIC_OR_DEPARTMENT_OTHER): Payer: 59 | Admitting: Hematology and Oncology

## 2019-01-24 ENCOUNTER — Other Ambulatory Visit: Payer: Self-pay

## 2019-01-24 ENCOUNTER — Other Ambulatory Visit: Payer: Self-pay | Admitting: Radiation Oncology

## 2019-01-24 VITALS — BP 97/63 | HR 87 | Resp 18

## 2019-01-24 DIAGNOSIS — C7951 Secondary malignant neoplasm of bone: Secondary | ICD-10-CM

## 2019-01-24 DIAGNOSIS — Z95828 Presence of other vascular implants and grafts: Secondary | ICD-10-CM

## 2019-01-24 DIAGNOSIS — Z171 Estrogen receptor negative status [ER-]: Secondary | ICD-10-CM

## 2019-01-24 DIAGNOSIS — C50512 Malignant neoplasm of lower-outer quadrant of left female breast: Secondary | ICD-10-CM

## 2019-01-24 DIAGNOSIS — Z7189 Other specified counseling: Secondary | ICD-10-CM

## 2019-01-24 DIAGNOSIS — C50919 Malignant neoplasm of unspecified site of unspecified female breast: Secondary | ICD-10-CM

## 2019-01-24 LAB — CMP (CANCER CENTER ONLY)
ALT: 20 U/L (ref 0–44)
AST: 25 U/L (ref 15–41)
Albumin: 2.9 g/dL — ABNORMAL LOW (ref 3.5–5.0)
Alkaline Phosphatase: 92 U/L (ref 38–126)
Anion gap: 8 (ref 5–15)
BUN: 9 mg/dL (ref 6–20)
CO2: 27 mmol/L (ref 22–32)
Calcium: 7.9 mg/dL — ABNORMAL LOW (ref 8.9–10.3)
Chloride: 100 mmol/L (ref 98–111)
Creatinine: 0.72 mg/dL (ref 0.44–1.00)
GFR, Est AFR Am: >60 mL/min (ref >60)
GFR, Estimated: >60 mL/min (ref >60)
Glucose, Bld: 127 mg/dL — ABNORMAL HIGH (ref 70–99)
Potassium: 3.4 mmol/L — ABNORMAL LOW (ref 3.5–5.1)
Sodium: 135 mmol/L (ref 135–145)
Total Bilirubin: 0.4 mg/dL (ref 0.3–1.2)
Total Protein: 6.6 g/dL (ref 6.5–8.1)

## 2019-01-24 LAB — CBC WITH DIFFERENTIAL (CANCER CENTER ONLY)
Abs Immature Granulocytes: 0.02 10*3/uL (ref 0.00–0.07)
Basophils Absolute: 0 10*3/uL (ref 0.0–0.1)
Basophils Relative: 0 %
Eosinophils Absolute: 0 10*3/uL (ref 0.0–0.5)
Eosinophils Relative: 0 %
HCT: 25.7 % — ABNORMAL LOW (ref 36.0–46.0)
Hemoglobin: 8.5 g/dL — ABNORMAL LOW (ref 12.0–15.0)
Immature Granulocytes: 1 %
Lymphocytes Relative: 6 %
Lymphs Abs: 0.2 10*3/uL — ABNORMAL LOW (ref 0.7–4.0)
MCH: 34.6 pg — ABNORMAL HIGH (ref 26.0–34.0)
MCHC: 33.1 g/dL (ref 30.0–36.0)
MCV: 104.5 fL — ABNORMAL HIGH (ref 80.0–100.0)
Monocytes Absolute: 0.2 10*3/uL (ref 0.1–1.0)
Monocytes Relative: 6 %
Neutro Abs: 3.3 10*3/uL (ref 1.7–7.7)
Neutrophils Relative %: 87 %
Platelet Count: 46 10*3/uL — ABNORMAL LOW (ref 150–400)
RBC: 2.46 MIL/uL — ABNORMAL LOW (ref 3.87–5.11)
RDW: 15.3 % (ref 11.5–15.5)
WBC Count: 3.8 10*3/uL — ABNORMAL LOW (ref 4.0–10.5)
nRBC: 0 % (ref 0.0–0.2)

## 2019-01-24 MED ORDER — SODIUM CHLORIDE 0.9% FLUSH
10.0000 mL | INTRAVENOUS | Status: DC | PRN
  Administered 2019-01-24: 10 mL via INTRAVENOUS
  Filled 2019-01-24: qty 10

## 2019-01-24 MED ORDER — HYDROMORPHONE HCL 1 MG/ML IJ SOLN
INTRAMUSCULAR | Status: AC
  Filled 2019-01-24: qty 2

## 2019-01-24 MED ORDER — HYDROMORPHONE HCL 1 MG/ML IJ SOLN
2.0000 mg | INTRAMUSCULAR | Status: DC | PRN
  Administered 2019-01-24: 2 mg via INTRAVENOUS

## 2019-01-24 MED ORDER — HEPARIN SOD (PORK) LOCK FLUSH 100 UNIT/ML IV SOLN
500.0000 [IU] | Freq: Once | INTRAVENOUS | Status: AC | PRN
  Administered 2019-01-24: 500 [IU] via INTRAVENOUS
  Filled 2019-01-24: qty 5

## 2019-01-24 MED ORDER — NARCAN 4 MG/0.1ML NA LIQD: NASAL | 1 refills | Status: AC

## 2019-01-24 MED ORDER — METHADONE HCL 5 MG PO TABS: 5.0000 mg | ORAL_TABLET | Freq: Three times a day (TID) | ORAL | 0 refills | Status: DC

## 2019-01-24 MED ORDER — HYDROMORPHONE HCL 2 MG/ML IJ SOLN
INTRAMUSCULAR | Status: AC
  Filled 2019-01-24: qty 1

## 2019-01-24 NOTE — Assessment & Plan Note (Signed)
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 Neoadjuvant chemotherapy with TCHPcompleted 02/17/2017 this would be followed by Herceptinand Perjetamaintenance for 1 yearcompleted September 2019 2.Bilateral mastectomies 03/28/2016:Bilateral mastectomies: Left mastectomy: IDC grade 2 0.9 cm, nodes negative, right mastectomy benign, ER 0%, PR 0%, HER-2 positive ratio 2.6 3.Adjuvant radiation4/08/2017 to 06/09/2017 4.Neratinib started 10/12/2017 discontinued due to diarrhea 5. Elbow fracture: Due to metastatic disease, palliative radiation therapy 6. Carboplatin atezolizumab at [facility] on a clinical trial [facility] 043 stopped for progression 12/04/2018 ---------------------------------------------------------------------------------------------------------------- Lung nodule biopsy: Metastatic breast cancer triple negative  CT CAP 12/03/2018: Progression of lung nodules and bone metastases done at [facility] Current treatment: Halaven days 1 and 8 every 3 weeks with Herceptin and Perjeta every 3 weeks, today cycle 2 day 8 [place] Chemo toxicities: 1. Hospitalization:  November 2020: Neutropenic fever at [facility] 01/02/2019-01/06/2019: Hospitalization for intractable back pain, MRI thoracic spine 01/03/2019: T12 and T8 metastatic disease. CT abdomen and pelvis: 01/01/2019: No significant change in the right middle lobe metastases. Multiple bone metastases progressed. Healing fracture right eighth rib 2. Neutropenic  fever: Halaven dose reduced After cycle 2 she had neutropenic fever again.  This time we did not hospitalize her. We will reduce the dosage of chemo again and do her Neulasta on day 9.  Intractable pain issues: Currently narcotic pain regimen.  We will continue to adjusted.  Gabapentin was increased yesterday by radiation oncology which I agree.  1

## 2019-01-24 NOTE — Progress Notes (Signed)
Per prior authorization department, no PA needed for [facility].  MD request patient to receive injection, Udencya, on D9 or D10 after Halaven.  Scheduling message sent.

## 2019-01-24 NOTE — Progress Notes (Signed)
No Halaven today per MD Lindi Adie

## 2019-01-24 NOTE — Telephone Encounter (Addendum)
The patient contacted me this morning to let us know that despite increasing neurontin, her current 60 mg long acting morphine, and 10 mg oxycodone q3-4 hours, she has not found relief in her left rib pain that was just radiated. Her treatment completed on 01/21/2019 and we had hoped by now she would have noticed improvement. I contacted Dr. Domingo Cocking and Dr. [name] in palliative medicine after I couldn't reach providers at [facility] who have seen her once. We are considering oral methadone versus PCA pump in the outpt setting.   I spoke with Dr. [name] and our plan will be to try Methadone 5 mg for long acting pain TID. She will stop Morphine today. She will need to take 10 mg Methadone for her first dose, then resume Methadone 5 mg. She can also continue Oxycodone 10 mg q 2 hours prn. A narcan kit was also recommended. I have confirmed with her pharmacy that they have these medications in stock and eprescribed them. I spoke with the patient and clarified the instructions. Hopefully she can get some good relief and stay out of the hospital this weekend and enjoy the holidays with her family.

## 2019-01-28 ENCOUNTER — Other Ambulatory Visit: Payer: Self-pay | Admitting: Hematology and Oncology

## 2019-01-28 ENCOUNTER — Other Ambulatory Visit: Payer: Self-pay

## 2019-01-28 DIAGNOSIS — C50512 Malignant neoplasm of lower-outer quadrant of left female breast: Secondary | ICD-10-CM

## 2019-01-28 DIAGNOSIS — Z171 Estrogen receptor negative status [ER-]: Secondary | ICD-10-CM

## 2019-01-29 ENCOUNTER — Inpatient Hospital Stay (HOSPITAL_BASED_OUTPATIENT_CLINIC_OR_DEPARTMENT_OTHER): Payer: 59 | Admitting: Medical

## 2019-01-29 ENCOUNTER — Other Ambulatory Visit: Payer: Self-pay

## 2019-01-29 ENCOUNTER — Telehealth: Payer: Self-pay

## 2019-01-29 ENCOUNTER — Other Ambulatory Visit: Payer: 59

## 2019-01-29 ENCOUNTER — Inpatient Hospital Stay: Payer: 59

## 2019-01-29 VITALS — BP 112/74 | HR 99 | Temp 98.7°F | Resp 18 | Ht 67.0 in | Wt 176.4 lb

## 2019-01-29 DIAGNOSIS — R0600 Dyspnea, unspecified: Secondary | ICD-10-CM

## 2019-01-29 DIAGNOSIS — R0602 Shortness of breath: Secondary | ICD-10-CM

## 2019-01-29 DIAGNOSIS — I82402 Acute embolism and thrombosis of unspecified deep veins of left lower extremity: Secondary | ICD-10-CM

## 2019-01-29 DIAGNOSIS — C50512 Malignant neoplasm of lower-outer quadrant of left female breast: Secondary | ICD-10-CM | POA: Diagnosis not present

## 2019-01-29 DIAGNOSIS — D649 Anemia, unspecified: Secondary | ICD-10-CM | POA: Diagnosis not present

## 2019-01-29 DIAGNOSIS — R071 Chest pain on breathing: Secondary | ICD-10-CM | POA: Diagnosis not present

## 2019-01-29 DIAGNOSIS — Z171 Estrogen receptor negative status [ER-]: Secondary | ICD-10-CM

## 2019-01-29 DIAGNOSIS — R0609 Other forms of dyspnea: Secondary | ICD-10-CM

## 2019-01-29 DIAGNOSIS — C7951 Secondary malignant neoplasm of bone: Secondary | ICD-10-CM

## 2019-01-29 DIAGNOSIS — R05 Cough: Secondary | ICD-10-CM

## 2019-01-29 DIAGNOSIS — R059 Cough, unspecified: Secondary | ICD-10-CM

## 2019-01-29 DIAGNOSIS — C50919 Malignant neoplasm of unspecified site of unspecified female breast: Secondary | ICD-10-CM

## 2019-01-29 LAB — CMP (CANCER CENTER ONLY)
ALT: 15 U/L (ref 0–44)
AST: 18 U/L (ref 15–41)
Albumin: 3 g/dL — ABNORMAL LOW (ref 3.5–5.0)
Alkaline Phosphatase: 116 U/L (ref 38–126)
Anion gap: 8 (ref 5–15)
BUN: 9 mg/dL (ref 6–20)
CO2: 26 mmol/L (ref 22–32)
Calcium: 8.6 mg/dL — ABNORMAL LOW (ref 8.9–10.3)
Chloride: 104 mmol/L (ref 98–111)
Creatinine: 0.77 mg/dL (ref 0.44–1.00)
GFR, Est AFR Am: >60 mL/min (ref >60)
GFR, Estimated: >60 mL/min (ref >60)
Glucose, Bld: 124 mg/dL — ABNORMAL HIGH (ref 70–99)
Potassium: 3.6 mmol/L (ref 3.5–5.1)
Sodium: 138 mmol/L (ref 135–145)
Total Bilirubin: 0.4 mg/dL (ref 0.3–1.2)
Total Protein: 7.1 g/dL (ref 6.5–8.1)

## 2019-01-29 LAB — CBC WITH DIFFERENTIAL (CANCER CENTER ONLY)
Abs Immature Granulocytes: 0.01 10*3/uL (ref 0.00–0.07)
Basophils Absolute: 0 10*3/uL (ref 0.0–0.1)
Basophils Relative: 1 %
Eosinophils Absolute: 0.1 10*3/uL (ref 0.0–0.5)
Eosinophils Relative: 3 %
HCT: 26.1 % — ABNORMAL LOW (ref 36.0–46.0)
Hemoglobin: 8.6 g/dL — ABNORMAL LOW (ref 12.0–15.0)
Immature Granulocytes: 1 %
Lymphocytes Relative: 20 %
Lymphs Abs: 0.4 10*3/uL — ABNORMAL LOW (ref 0.7–4.0)
MCH: 34.3 pg — ABNORMAL HIGH (ref 26.0–34.0)
MCHC: 33 g/dL (ref 30.0–36.0)
MCV: 104 fL — ABNORMAL HIGH (ref 80.0–100.0)
Monocytes Absolute: 0.3 10*3/uL (ref 0.1–1.0)
Monocytes Relative: 13 %
Neutro Abs: 1.4 10*3/uL — ABNORMAL LOW (ref 1.7–7.7)
Neutrophils Relative %: 62 %
Platelet Count: 41 10*3/uL — ABNORMAL LOW (ref 150–400)
RBC: 2.51 MIL/uL — ABNORMAL LOW (ref 3.87–5.11)
RDW: 15.2 % (ref 11.5–15.5)
WBC Count: 2.1 10*3/uL — ABNORMAL LOW (ref 4.0–10.5)
nRBC: 0 % (ref 0.0–0.2)

## 2019-01-29 MED ORDER — ALBUTEROL SULFATE HFA 108 (90 BASE) MCG/ACT IN AERS: 2.0000 | INHALATION_SPRAY | Freq: Four times a day (QID) | RESPIRATORY_TRACT | 2 refills | Status: DC | PRN

## 2019-01-29 MED ORDER — FLOVENT HFA 110 MCG/ACT IN AERO: 2.0000 | INHALATION_SPRAY | Freq: Two times a day (BID) | RESPIRATORY_TRACT | 2 refills | Status: DC

## 2019-01-29 NOTE — Telephone Encounter (Signed)
Called Laura Anthony to let her know that she already has a 7:30 appt tomorrow and we will be giving her a unit of blood as well.  We extended the length of her appt time as well to reflect the time she will be here. Blood bank called to verify if orders are in correctly. Gardiner Rhyme

## 2019-01-29 NOTE — Progress Notes (Signed)
Pt returning tomorrow (01/30/2019) for transfusion of 1 unit along with previously scheduled infusion d/t symptomatic anemia w/SOB/DOE.  PT aware to come early for appt and to wear blue blood bracelet.

## 2019-01-29 NOTE — Patient Instructions (Signed)
COVID-19: How to Protect Yourself and Others Know how it spreads  There is currently no vaccine to prevent coronavirus disease 2019 (COVID-19).  The best way to prevent illness is to avoid being exposed to this virus.  The virus is thought to spread mainly from person-to-person. ? Between people who are in close contact with one another (within about 6 feet). ? Through respiratory droplets produced when an infected person coughs, sneezes or talks. ? These droplets can land in the mouths or noses of people who are nearby or possibly be inhaled into the lungs. ? Some recent studies have suggested that COVID-19 may be spread by people who are not showing symptoms. Everyone should Clean your hands often  Wash your hands often with soap and water for at least 20 seconds especially after you have been in a public place, or after blowing your nose, coughing, or sneezing.  If soap and water are not readily available, use a hand sanitizer that contains at least 60% alcohol. Cover all surfaces of your hands and rub them together until they feel dry.  Avoid touching your eyes, nose, and mouth with unwashed hands. Avoid close contact  Stay home if you are sick.  Avoid close contact with people who are sick.  Put distance between yourself and other people. ? Remember that some people without symptoms may be able to spread virus. ? This is especially important for people who are at higher risk of getting very sick.www.cdc.gov/coronavirus/2019-ncov/need-extra-precautions/people-at-higher-risk.html Cover your mouth and nose with a cloth face cover when around others  You could spread COVID-19 to others even if you do not feel sick.  Everyone should wear a cloth face cover when they have to go out in public, for example to the grocery store or to pick up other necessities. ? Cloth face coverings should not be placed on young children under age 2, anyone who has trouble breathing, or is unconscious,  incapacitated or otherwise unable to remove the mask without assistance.  The cloth face cover is meant to protect other people in case you are infected.  Do NOT use a facemask meant for a healthcare worker.  Continue to keep about 6 feet between yourself and others. The cloth face cover is not a substitute for social distancing. Cover coughs and sneezes  If you are in a private setting and do not have on your cloth face covering, remember to always cover your mouth and nose with a tissue when you cough or sneeze or use the inside of your elbow.  Throw used tissues in the trash.  Immediately wash your hands with soap and water for at least 20 seconds. If soap and water are not readily available, clean your hands with a hand sanitizer that contains at least 60% alcohol. Clean and disinfect  Clean AND disinfect frequently touched surfaces daily. This includes tables, doorknobs, light switches, countertops, handles, desks, phones, keyboards, toilets, faucets, and sinks. www.cdc.gov/coronavirus/2019-ncov/prevent-getting-sick/disinfecting-your-home.html  If surfaces are dirty, clean them: Use detergent or soap and water prior to disinfection.  Then, use a household disinfectant. You can see a list of EPA-registered household disinfectants here. cdc.gov/coronavirus 06/05/2018 This information is not intended to replace advice given to you by your health care provider. Make sure you discuss any questions you have with your health care provider. Document Released: 05/15/2018 Document Revised: 06/13/2018 Document Reviewed: 05/15/2018 Elsevier Patient Education  2020 Elsevier Inc.  

## 2019-01-29 NOTE — Progress Notes (Signed)
Sympt[facility] Management Clinic Progress Note   Laura Anthony [id number] Nov 12, 1975 43 y.o.  Laura Anthony is managed by Dr. Nicholas Lose  Actively treated with chemotherapy/immunotherapy/hormonal therapy: yes  Current therapy: [place] with Herceptin and Perjeta  Next scheduled appointment with provider: 02/08/2019  Assessment: Plan:    Symptomatic anemia - Plan: Practitioner attestation of consent, Complete patient signature process for consent form, Type and screen, Care order/instruction, acetaminophen (TYLENOL) tablet 650 mg, CT Chest W Contrast, CT ANGIO CHEST PE W OR WO CONTRAST, Type and screen, Practitioner attestation of consent, Complete patient signature process for consent form, Type and screen, Care order/instruction, 0.9 %  sodium chloride infusion (Manually program via Guardrails IV Fluids), heparin lock flush 100 unit/mL, Prepare RBC, Transfuse RBC, acetaminophen (TYLENOL) tablet 650 mg, Prepare RBC, CANCELED: Prepare RBC, CANCELED: Transfuse RBC  DOE (dyspnea on exertion) - Plan: Practitioner attestation of consent, Complete patient signature process for consent form, Type and screen, Care order/instruction, acetaminophen (TYLENOL) tablet 650 mg, CT Chest W Contrast, CT ANGIO CHEST PE W OR WO CONTRAST, Type and screen, CANCELED: Prepare RBC, CANCELED: Transfuse RBC  Acute embolism and thrombosis of deep vein of left lower extremity ([place]) - Plan: CT Chest W Contrast, CT ANGIO CHEST PE W OR WO CONTRAST  Chest pain on breathing - Plan: CT Chest W Contrast, CT ANGIO CHEST PE W OR WO CONTRAST  Cough - Plan: CT Chest W Contrast, CT ANGIO CHEST PE W OR WO CONTRAST  Shortness of breath - Plan: CT Chest W Contrast, CT ANGIO CHEST PE W OR WO CONTRAST  Bone metastases ([place]) - Plan: CT Chest W Contrast, CT ANGIO CHEST PE W OR WO CONTRAST  Malignant neoplasm of lower-outer quadrant of left breast of female, estrogen receptor negative ([place]) - Plan: CT Chest W  Contrast, CT ANGIO CHEST PE W OR WO CONTRAST  Metastatic breast cancer ([place]) - Plan: CT Chest W Contrast, CT ANGIO CHEST PE W OR WO CONTRAST   Symptomatic anemia.  His CBC returned today with a hemoglobin of 8.6.  Based on the patient's symptoms and shortness of breath she will be transfused with 1 unit of packed red blood cells tomorrow.  History of dyspnea on exertion, cough, shortness of breath, and acute left lower extremity DVT: The patient has been referred for a CT angiogram.  She continues on Eliquis and has not missed any doses.  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  She will return tomorrow for treatment with Perjeta and trastuzumab.  She will be seen by Dr. Nicholas Lose follow-up on 02/08/2019.  Please see After Visit Summary for patient specific instructions.  Future Appointments  Date Time Provider [place]  01/30/2019  7:30 AM [facility] INFUSION [facility] None  01/31/2019  8:00 AM [facility]-CT 1 [facility]-CT [place]  01/31/2019  8:30 AM WL-CT 1 WL-CT [place]  01/31/2019  1:00 PM [place] FLUSH [facility] None  02/08/2019  8:30 AM [facility] LAB 4 [facility] None  02/08/2019  8:45 AM [facility] [place] FLUSH [facility] None  02/08/2019  9:15 AM Nicholas Lose, MD [facility] None  02/08/2019 10:15 AM [facility] INFUSION [facility] None  06/21/2019 10:45 AM Nicholas Lose, MD [facility] None    Orders Placed This Encounter  Procedures  . CT Chest W Contrast  . CT ANGIO CHEST PE W OR WO CONTRAST  . Practitioner attestation of consent  . Complete patient signature process for consent  form  . Care order/instruction  . Practitioner attestation of consent  . Complete patient signature process for consent form  . Care order/instruction  . Type and screen  . Type and screen  . Prepare RBC       Subjective:   Patient ID:  Laura Anthony is a 43 y.o. (DOB November 17, 1975)  female.  Chief Complaint:  Chief Complaint  Patient presents with  . Shortness of Breath    HPI Laura Anthony  Is a 43 y.o. female with a diagnosis of a metastatic breast cancer with bone and pulmonary metastasis.  She is managed by Dr. Payton Mccallum and has been treated with  [place], Herceptin and Perjeta most recently.  She presents to the clinic today with increasing shortness of breath and dyspnea on exertion since radiation to her spine.  She has noticed that these have increased in severity over the past 2 weeks.  She is using a walker at home due to shortness of breath and some pain on the left side.  Her left side pain has improved since beginning radiation therapy.  She reports episodic nausea.  She denies fevers, chills, sweats, vomiting, constipation, or diarrhea.  Medications: I have reviewed the patient's current medications.  Allergies:  Allergies  Allergen Reactions  . Sumatriptan Other (See Comments)    Numbness to face   . Statins Other (See Comments)    Leg pain  . Tape Rash and Other (See Comments)    Rash from dressing over port-a-cath     Past Medical History:  Diagnosis Date  . ADD (attention deficit disorder)   . Anemia   . Anxiety   . Breast cancer, left breast ([place])    S/P mastectomy 03/27/2017  . DVT (deep venous thrombosis) ([place]) 2017   calf left - probably due to [facility] pills-took eliquis x3 mos, nonthing now  . High cholesterol   . Impingement syndrome of right shoulder 07/2013  . Migraine    "usually 1/month" (03/28/2017)  . PONV (postoperative nausea and vomiting)   . Right bicipital tenosynovitis 07/2013  . Rotator cuff impingement syndrome of right shoulder 07/12/2013  . Seizures ([place])    x 1 as a child - was never on anticonvulsants (03/28/2017)    Past Surgical History:  Procedure Laterality Date  . ADENOIDECTOMY  1981  . ANKLE ARTHROSCOPY Right   . BREAST BIOPSY Left 10/2016  . KNEE ARTHROSCOPY Right   . KNEE ARTHROSCOPY W/ ACL  RECONSTRUCTION Left   . LIPOSUCTION WITH LIPOFILLING Bilateral 01/05/2018   Procedure: LIPOFILLING FROM ABDOMEN TO BILATERAL CHEST;  Surgeon: Irene Limbo, MD;  Location: [place];  Service: Plastics;  Laterality: Bilateral;  . MASTECTOMY Left 03/27/2017   NIPPLE SPARING MASTECTOMY WITH RADIOACTIVE SEED TARGETED LYMPH NODE EXCISION AND LEFT AXILLARY SENTINEL LYMPH NODE BIOPSY  . MASTECTOMY Right 03/27/2017   RIGHT PROPHYLACTIC NIPPLE SPARING MASTECTOMY  . NIPPLE SPARING MASTECTOMY Right 03/27/2017   Procedure: RIGHT PROPHYLACTIC NIPPLE SPARING MASTECTOMY;  Surgeon: Rolm Bookbinder, MD;  Location: [place];  Service: General;  Laterality: Right;  . PORT-A-CATH REMOVAL Right 01/05/2018   Procedure: REMOVAL RIGHT CHEST PORT;  Surgeon: Irene Limbo, MD;  Location: [place];  Service: Plastics;  Laterality: Right;  . PORTACATH PLACEMENT N/A 11/01/2016   Procedure: INSERTION PORT-A-CATH WITH [place];  Surgeon: Rolm Bookbinder, MD;  Location: [place];  Service: General;  Laterality: N/A;  . PORTACATH PLACEMENT N/A 08/23/2018   Procedure: INSERTION PORT-A-CATH WITH ULTRASOUND;  Surgeon: Rolm Bookbinder, MD;  Location: [place];  Service: General;  Laterality: N/A;  . RADIOACTIVE SEED GUIDED AXILLARY SENTINEL LYMPH NODE Left 03/27/2017   Procedure: LEFT NIPPLE SPARING MASTECTOMY WITH RADIOACTIVE SEED TARGETED LYMPH NODE EXCISION AND LEFT AXILLARY SENTINEL LYMPH NODE BIOPSY;  Surgeon: Rolm Bookbinder, MD;  Location: [place];  Service: General;  Laterality: Left;  REQUESTS RNFA  . RECONSTRUCTION BREAST IMMEDIATE / DELAYED W/ TISSUE EXPANDER Bilateral 03/27/2017   BILATERAL BREAST RECONSTRUCTION WITH PLACEMENT OF TISSUE EXPANDER AND ALLODERM  . REMOVAL OF BILATERAL TISSUE EXPANDERS WITH PLACEMENT OF BILATERAL BREAST IMPLANTS Bilateral 01/05/2018   Procedure: REMOVAL OF BILATERAL TISSUE EXPANDERS WITH PLACEMENT OF  BILATERAL BREAST IMPLANTS;  Surgeon: Irene Limbo, MD;  Location: [place];  Service: Plastics;  Laterality: Bilateral;  . SEPTOPLASTY WITH ETHMOIDECTOMY, AND MAXILLARY ANTROSTOMY  10/29/2010   bilat. max. antrostomy with left max. stripping; left ant. ethmoidectomy; right total ethmoidectomy; sphenoidotomy  . SHOULDER ARTHROSCOPY WITH SUBACROMIAL DECOMPRESSION AND BICEP TENDON REPAIR Right 07/12/2013   Procedure: RIGHT SHOULDER ARTHROSCOPY DEBRIDEMENT EXTENSIVE DECOMPRESSION SUBACROMIAL PARTIAL ACROMIOPLASTY;  Surgeon: Johnny Bridge, MD;  Location: [place];  Service: Orthopedics;  Laterality: Right;  . WRIST ARTHROSCOPY  01/17/2012   Procedure: ARTHROSCOPY WRIST; right wrist Surgeon: Tennis Must, MD;  Location: [place];  Service: Orthopedics;  Laterality: Right;  RIGHT WRIST ARTHROSCOPY WITH TRIANGULAR FIBROCARTILAGE COMPLEX REPAIR AND DEBRIDEMENT     Family History  Problem Relation Age of Onset  . Breast cancer Mother 51       triple negative  . Leukemia Father   . Lung cancer Father   . Heart attack Maternal Uncle   . Prostate cancer Paternal Uncle   . COPD Paternal Grandmother   . Heart disease Paternal Grandfather   . Prostate cancer Paternal Uncle   . Leukemia Cousin     Social History   Socioeconomic History  . Marital status: Married    Spouse name: Not on file  . Number of children: Not on file  . Years of education: Not on file  . Highest education level: Not on file  Occupational History  . Not on file  Tobacco Use  . Smoking status: Never Smoker  . Smokeless tobacco: Never Used  Substance and Sexual Activity  . Alcohol use: Yes    Comment: Drinks very rare  . Drug use: No  . Sexual activity: Yes    Birth control/protection: Surgical    Comment: husband has had a vasectomy  Other Topics Concern  . Not on file  Social History Narrative  . Not on file   Social Determinants of Health   Financial  Resource Strain:   . Difficulty of Paying Living Expenses: Not on file  Food Insecurity:   . Worried About Charity fundraiser in the Last Year: Not on file  . Ran Out of Food in the Last Year: Not on file  Transportation Needs:   . Lack of Transportation (Medical): Not on file  . Lack of Transportation (Non-Medical): Not on file  Physical Activity:   . Days of Exercise per Week: Not on file  . Minutes of Exercise per Session: Not on file  Stress:   . Feeling of Stress : Not on file  Social Connections:   . Frequency of Communication with Friends and Family: Not on file  . Frequency of Social Gatherings with Friends and Family: Not on file  . Attends Religious  Services: Not on file  . Active Member of Clubs or Organizations: Not on file  . Attends Archivist Meetings: Not on file  . Marital Status: Not on file  Intimate Partner Violence:   . Fear of Current or Ex-Partner: Not on file  . Emotionally Abused: Not on file  . Physically Abused: Not on file  . Sexually Abused: Not on file    Past Medical History, Surgical history, Social history, and Family history were reviewed and updated as appropriate.   Please see review of systems for further details on the patient's review from today.   Review of Systems:  Review of Systems  Constitutional: Negative for chills, diaphoresis and fever.  HENT: Negative for trouble swallowing.   Respiratory: Positive for shortness of breath. Negative for cough, choking, chest tightness, wheezing and stridor.   Cardiovascular: Negative for chest pain and palpitations.    Objective:   Physical Exam:  BP 112/74 (BP Location: Right Arm, Patient Position: Sitting)   Pulse 99   Temp 98.7 F (37.1 C) (Temporal)   Resp 18   Ht 5\' 7"  (1.702 m)   Wt 176 lb 6.4 oz (80 kg)   SpO2 98%   BMI 27.63 kg/m  ECOG: 1  Oxygen Saturation:  Room air at rest:    97 %  Pulse:  93 BPM Room air with ambulation:   99 %  Pulse: 112 BPM  Physical  Exam Constitutional:      General: She is not in acute distress.    Appearance: She is not diaphoretic.  HENT:     Head: Normocephalic and atraumatic.  Cardiovascular:     Rate and Rhythm: Normal rate and regular rhythm.     Heart sounds: Normal heart sounds. No murmur. No friction rub. No gallop.   Pulmonary:     Effort: Pulmonary effort is normal. No respiratory distress.     Breath sounds: Normal breath sounds. No wheezing or rales.  Skin:    General: Skin is warm and dry.     Findings: No erythema or rash.  Neurological:     Mental Status: She is alert.     Comments: The patient is ambulating with a rolling walker.     Lab Review:     Component Value Date/Time   NA 138 01/29/2019 1243   NA 138 01/27/2017 0802   K 3.6 01/29/2019 1243   K 4.1 01/27/2017 0802   CL 104 01/29/2019 1243   CO2 26 01/29/2019 1243   CO2 26 01/27/2017 0802   GLUCOSE 124 (H) 01/29/2019 1243   GLUCOSE 90 01/27/2017 0802   BUN 9 01/29/2019 1243   BUN 14.0 01/27/2017 0802   CREATININE 0.77 01/29/2019 1243   CREATININE 0.7 01/27/2017 0802   CALCIUM 8.6 (L) 01/29/2019 1243   CALCIUM 8.6 01/27/2017 0802   PROT 7.1 01/29/2019 1243   PROT 6.5 01/27/2017 0802   ALBUMIN 3.0 (L) 01/29/2019 1243   ALBUMIN 3.5 01/27/2017 0802   AST 18 01/29/2019 1243   AST 22 01/27/2017 0802   ALT 15 01/29/2019 1243   ALT 23 01/27/2017 0802   ALKPHOS 116 01/29/2019 1243   ALKPHOS 85 01/27/2017 0802   BILITOT 0.4 01/29/2019 1243   BILITOT 0.23 01/27/2017 0802   GFRNONAA >60 01/29/2019 1243   GFRAA >60 01/29/2019 1243       Component Value Date/Time   WBC 2.1 (L) 01/29/2019 1243   WBC 11.0 (H) 01/04/2019 1649   RBC 2.51 (L) 01/29/2019  1243   HGB 8.6 (L) 01/29/2019 1243   HGB 11.1 (L) 01/27/2017 0802   HCT 26.1 (L) 01/29/2019 1243   HCT 33.4 (L) 01/27/2017 0802   PLT 41 (L) 01/29/2019 1243   PLT 170 01/27/2017 0802   MCV 104.0 (H) 01/29/2019 1243   MCV 99.0 01/27/2017 0802   MCH 34.3 (H) 01/29/2019 1243    MCHC 33.0 01/29/2019 1243   RDW 15.2 01/29/2019 1243   RDW 18.2 (H) 01/27/2017 0802   LYMPHSABS 0.4 (L) 01/29/2019 1243   LYMPHSABS 1.7 01/27/2017 0802   MONOABS 0.3 01/29/2019 1243   MONOABS 0.5 01/27/2017 0802   EOSABS 0.1 01/29/2019 1243   EOSABS 0.0 01/27/2017 0802   BASOSABS 0.0 01/29/2019 1243   BASOSABS 0.0 01/27/2017 0802   -------------------------------  Imaging from last 24 hours (if applicable):  Radiology interpretation: MR THORACIC SPINE W WO CONTRAST  Result Date: 01/03/2019 CLINICAL DATA:  Worsening thoracic spine pain in a patient with metastatic breast cancer. EXAM: MRI THORACIC WITHOUT AND WITH CONTRAST TECHNIQUE: Multiplanar and multiecho pulse sequences of the thoracic spine were obtained without and with intravenous contrast. CONTRAST:  8 mL GADAVIST IV COMPARISON:  CT chest 12/09/2018. PET CT scan 08/02/2018. FINDINGS: MRI THORACIC SPINE FINDINGS Alignment:  Normal. Vertebrae: There is no fracture. An enhancing lesion in the central aspect of T12 is consistent with a metastatic deposit measuring approximately 1.2 cm AP x 1.1 cm craniocaudal x 1.7 cm transverse. A 0.5 cm in diameter enhancing lesion is also seen in the right side of the T8 vertebral body. Benign hemangioma in T8 is noted. Increased T1 and T2 signal in the majority of T10, throughout T11 and the superior aspect of T12 is most consistent with prior radiation therapy. Cord:  Normal signal throughout. No epidural tumor. Paraspinal and other soft tissues: Dependent atelectasis noted. 0.4 cm in diameter T2 hyperintense focus in the posterior arc of the right third rib may also represent metastatic disease Disc levels: No disc bulge or protrusion. The central canal and foramina are widely patent at all levels. IMPRESSION: 1. Findings consistent with metastatic disease in the T12 and T8 vertebral bodies as described above. Negative for fracture. 2. Negative for central canal or foraminal stenosis. No epidural  tumor. Electronically Signed   By: Inge Rise M.D.   On: 01/03/2019 15:42   CT ABDOMEN PELVIS W CONTRAST  Result Date: 01/01/2019 CLINICAL DATA:  Back pain. Metastatic breast cancer. Left flank pain. EXAM: CT ABDOMEN AND PELVIS WITH CONTRAST TECHNIQUE: Multidetector CT imaging of the abdomen and pelvis was performed using the standard protocol following bolus administration of intravenous contrast. CONTRAST:  148mL OMNIPAQUE IOHEXOL 300 MG/ML  SOLN COMPARISON:  CT scan of the pelvis dated 12/11/2018 and PET-CT dated 08/02/2018 and chest CT dated 12/09/2018 no acute abnormalities. FINDINGS: Lower chest: There is a 18 x 13 mm metastasis in right middle lobe. This was visible on the prior study of 12/09/2018 when it measured 19 x 15 mm. Hepatobiliary: No focal liver abnormality is seen. No gallstones, gallbladder wall thickening, or biliary dilatation. Pancreas: Unremarkable. No pancreatic ductal dilatation or surrounding inflammatory changes. Spleen: Normal in size without focal abnormality. Adrenals/Urinary Tract: Stable 9 mm low-density nodule in the left adrenal gland. Right adrenal gland is normal. Kidneys are normal. No hydronephrosis. Bladder is normal. Stomach/Bowel: Stomach is within normal limits. Appendix appears normal. No evidence of bowel wall thickening, distention, or inflammatory changes. Vascular/Lymphatic: No significant vascular findings are present. No enlarged abdominal or pelvic lymph  nodes. Reproductive: Uterus and bilateral adnexa are unremarkable. Other: No abdominal wall hernia or abnormality. No abdominopelvic ascites. Musculoskeletal: The patient has multiple small bone metastases which are progressed since the PET-CT of 08/02/2018. There is a healing fracture of the posterolateral aspect of the right eighth rib. There subtle lesions in the lateral aspect of the T9 vertebral body and in the central portion of the T12 vertebral body. There is a subtle lesion in the left side of the  L3 vertebral body as well as a tiny lesion in the left transverse process of L4 and a 24 x 14 mm lesion in the left side of the S1 segment of the sacrum as well as a 10 mm lesion in the left ilium adjacent to the left SI joint. There is a lytic lesion in the right inferior pubic ramus. 9 mm lesion in the right sacral ala. No discrete muscle abnormality. No visible tumor in the spinal canal. IMPRESSION: 1. No acute abnormality of the abdomen or pelvis. 2. No significant change in the metastasis in the right middle lobe. 3. Multiple small bone metastases which are progressed since the PET-CT of 08/02/2018 but essentially unchanged since the pelvic CT of 12/11/2018 and chest CT of 12/09/2018. 4. Healing fracture of the posterolateral aspect of the right eighth rib. Electronically Signed   By: Lorriane Shire M.D.   On: 01/01/2019 15:04        This case was discussed with Dr. Lindi Adie. He expresses agreement with my management of this patient.

## 2019-01-30 ENCOUNTER — Telehealth: Payer: Self-pay | Admitting: Emergency Medicine

## 2019-01-30 ENCOUNTER — Telehealth: Payer: Self-pay | Admitting: Medical

## 2019-01-30 ENCOUNTER — Encounter: Payer: Self-pay | Admitting: Internal Medicine

## 2019-01-30 ENCOUNTER — Other Ambulatory Visit: Payer: Self-pay

## 2019-01-30 ENCOUNTER — Encounter (HOSPITAL_COMMUNITY): Payer: Self-pay

## 2019-01-30 ENCOUNTER — Inpatient Hospital Stay: Payer: 59

## 2019-01-30 ENCOUNTER — Ambulatory Visit (HOSPITAL_COMMUNITY)
Admission: RE | Admit: 2019-01-30 | Discharge: 2019-01-30 | Disposition: A | Discharge status: Home / Self Care | Payer: 59 | Source: Ambulatory Visit | Attending: Medical | Admitting: Medical

## 2019-01-30 VITALS — BP 108/70 | HR 80 | Temp 98.7°F | Resp 16

## 2019-01-30 DIAGNOSIS — I82402 Acute embolism and thrombosis of unspecified deep veins of left lower extremity: Secondary | ICD-10-CM | POA: Insufficient documentation

## 2019-01-30 DIAGNOSIS — D649 Anemia, unspecified: Secondary | ICD-10-CM

## 2019-01-30 DIAGNOSIS — R05 Cough: Secondary | ICD-10-CM | POA: Insufficient documentation

## 2019-01-30 DIAGNOSIS — Z171 Estrogen receptor negative status [ER-]: Secondary | ICD-10-CM | POA: Insufficient documentation

## 2019-01-30 DIAGNOSIS — R0602 Shortness of breath: Secondary | ICD-10-CM | POA: Diagnosis present

## 2019-01-30 DIAGNOSIS — R0600 Dyspnea, unspecified: Secondary | ICD-10-CM

## 2019-01-30 DIAGNOSIS — C50512 Malignant neoplasm of lower-outer quadrant of left female breast: Secondary | ICD-10-CM | POA: Diagnosis present

## 2019-01-30 DIAGNOSIS — C7951 Secondary malignant neoplasm of bone: Secondary | ICD-10-CM | POA: Insufficient documentation

## 2019-01-30 DIAGNOSIS — R071 Chest pain on breathing: Secondary | ICD-10-CM | POA: Insufficient documentation

## 2019-01-30 DIAGNOSIS — C50919 Malignant neoplasm of unspecified site of unspecified female breast: Secondary | ICD-10-CM | POA: Diagnosis present

## 2019-01-30 DIAGNOSIS — R059 Cough, unspecified: Secondary | ICD-10-CM

## 2019-01-30 DIAGNOSIS — R0609 Other forms of dyspnea: Secondary | ICD-10-CM

## 2019-01-30 DIAGNOSIS — Z7189 Other specified counseling: Secondary | ICD-10-CM

## 2019-01-30 LAB — PREPARE RBC (CROSSMATCH)

## 2019-01-30 LAB — SAMPLE TO BLOOD BANK

## 2019-01-30 LAB — ABO/RH: ABO/RH(D): A POS

## 2019-01-30 MED ORDER — HEPARIN SOD (PORK) LOCK FLUSH 100 UNIT/ML IV SOLN
500.0000 [IU] | Freq: Once | INTRAVENOUS | Status: AC
  Administered 2019-01-30: 500 [IU] via INTRAVENOUS

## 2019-01-30 MED ORDER — DIPHENHYDRAMINE HCL 25 MG PO CAPS
ORAL_CAPSULE | ORAL | Status: AC
  Filled 2019-01-30: qty 2

## 2019-01-30 MED ORDER — ACETAMINOPHEN 325 MG PO TABS
ORAL_TABLET | ORAL | Status: AC
  Filled 2019-01-30: qty 2

## 2019-01-30 MED ORDER — SODIUM CHLORIDE 0.9 % IV SOLN
420.0000 mg | Freq: Once | INTRAVENOUS | Status: AC
  Administered 2019-01-30: 420 mg via INTRAVENOUS
  Filled 2019-01-30: qty 14

## 2019-01-30 MED ORDER — SODIUM CHLORIDE 0.9 % IV SOLN
Freq: Once | INTRAVENOUS | Status: AC
  Filled 2019-01-30: qty 250

## 2019-01-30 MED ORDER — TRASTUZUMAB-DKST CHEMO 150 MG IV SOLR
450.0000 mg | Freq: Once | INTRAVENOUS | Status: AC
  Administered 2019-01-30: 450 mg via INTRAVENOUS
  Filled 2019-01-30: qty 21.43

## 2019-01-30 MED ORDER — DIPHENHYDRAMINE HCL 25 MG PO CAPS
50.0000 mg | ORAL_CAPSULE | Freq: Once | ORAL | Status: AC
  Administered 2019-01-30: 50 mg via ORAL

## 2019-01-30 MED ORDER — HEPARIN SOD (PORK) LOCK FLUSH 100 UNIT/ML IV SOLN
INTRAVENOUS | Status: AC
  Filled 2019-01-30: qty 5

## 2019-01-30 MED ORDER — HEPARIN SOD (PORK) LOCK FLUSH 100 UNIT/ML IV SOLN
500.0000 [IU] | Freq: Once | INTRAVENOUS | Status: DC | PRN
  Filled 2019-01-30: qty 5

## 2019-01-30 MED ORDER — SODIUM CHLORIDE (PF) 0.9 % IJ SOLN
INTRAMUSCULAR | Status: AC
  Filled 2019-01-30: qty 50

## 2019-01-30 MED ORDER — TRIAMCINOLONE ACETONIDE 0.5 % EX CREA: 1.0000 "application " | TOPICAL_CREAM | Freq: Three times a day (TID) | CUTANEOUS | 1 refills | Status: DC

## 2019-01-30 MED ORDER — SODIUM CHLORIDE 0.9% FLUSH
10.0000 mL | INTRAVENOUS | Status: DC | PRN
  Administered 2019-01-30: 10 mL
  Filled 2019-01-30: qty 10

## 2019-01-30 MED ORDER — ACETAMINOPHEN 325 MG PO TABS
650.0000 mg | ORAL_TABLET | Freq: Once | ORAL | Status: AC
  Administered 2019-01-30: 10:00:00 650 mg via ORAL

## 2019-01-30 MED ORDER — IOHEXOL 350 MG/ML SOLN
100.0000 mL | Freq: Once | INTRAVENOUS | Status: AC | PRN
  Administered 2019-01-30: 100 mL via INTRAVENOUS

## 2019-01-30 NOTE — Progress Notes (Signed)
Per Dr. Lindi Adie okay for blood transfusion to run at 399mL/hr for 01/30/2019.

## 2019-01-30 NOTE — Progress Notes (Signed)
I called [name] with these results. [name]

## 2019-01-30 NOTE — Patient Instructions (Signed)
[facility] Discharge Instructions for Patients Receiving Chemotherapy  Today you received the following chemotherapy agents: trastuzumab and pertuzumab.  To help prevent nausea and vomiting after your treatment, we encourage you to take your nausea medication as directed.   If you develop nausea and vomiting that is not controlled by your nausea medication, call the clinic.   BELOW ARE SYMPTOMS THAT SHOULD BE REPORTED IMMEDIATELY:  *FEVER GREATER THAN 100.5 F  *CHILLS WITH OR WITHOUT FEVER  NAUSEA AND VOMITING THAT IS NOT CONTROLLED WITH YOUR NAUSEA MEDICATION  *UNUSUAL SHORTNESS OF BREATH  *UNUSUAL BRUISING OR BLEEDING  TENDERNESS IN MOUTH AND THROAT WITH OR WITHOUT PRESENCE OF ULCERS  *URINARY PROBLEMS  *BOWEL PROBLEMS  UNUSUAL RASH Items with * indicate a potential emergency and should be followed up as soon as possible.  Feel free to call the clinic should you have any questions or concerns. The clinic phone number is [phone].  Please show the [facility] at check-in to the Emergency Department and triage nurse.   Blood Transfusion, Adult  A blood transfusion is a procedure in which you receive donated blood, including plasma, platelets, and red blood cells, through an IV tube. You may need a blood transfusion because of illness, surgery, or injury. The blood may come from a donor. You may also be able to donate blood for yourself (autologous blood donation) before a surgery if you know that you might require a blood transfusion. The blood given in a transfusion is made up of different types of cells. You may receive:  Red blood cells. These carry oxygen to the cells in the body.  White blood cells. These help you fight infections.  Platelets. These help your blood to clot.  Plasma. This is the liquid part of your blood and it helps with fluid imbalances. If you have hemophilia or another clotting disorder, you may also receive other types  of blood products. Tell a health care provider about:  Any allergies you have.  All medicines you are taking, including vitamins, herbs, eye drops, creams, and over-the-counter medicines.  Any problems you or family members have had with anesthetic medicines.  Any blood disorders you have.  Any surgeries you have had.  Any medical conditions you have, including any recent fever or cold symptoms.  Whether you are pregnant or may be pregnant.  Any previous reactions you have had during a blood transfusion. What are the risks? Generally, this is a safe procedure. However, problems may occur, including:  Having an allergic reaction to something in the donated blood. Hives and itching may be symptoms of this type of reaction.  Fever. This may be a reaction to the white blood cells in the transfused blood. Nausea or chest pain may accompany a fever.  Iron overload. This can happen from having many transfusions.  Transfusion-related acute lung injury (TRALI). This is a rare reaction that causes lung damage. The cause is not known.TRALI can occur within hours of a transfusion or several days later.  Sudden (acute) or delayed hemolytic reactions. This happens if your blood does not match the cells in your transfusion. Your body's defense system (immune system) may try to attack the new cells. This complication is rare. The symptoms include fever, chills, nausea, and low back pain or chest pain.  Infection or disease transmission. This is rare. What happens before the procedure?  You will have a blood test to determine your blood type. This is necessary to know what kind  of blood your body will accept and to match it to the donor blood.  If you are going to have a planned surgery, you may be able to do an autologous blood donation. This may be done in case you need to have a transfusion.  If you have had an allergic reaction to a transfusion in the past, you may be given medicine to help  prevent a reaction. This medicine may be given to you by mouth or through an IV tube.  You will have your temperature, blood pressure, and pulse monitored before the transfusion.  Follow instructions from your health care provider about eating and drinking restrictions.  Ask your health care provider about: ? Changing or stopping your regular medicines. This is especially important if you are taking diabetes medicines or blood thinners. ? Taking medicines such as aspirin and ibuprofen. These medicines can thin your blood. Do not take these medicines before your procedure if your health care provider instructs you not to. What happens during the procedure?  An IV tube will be inserted into one of your veins.  The bag of donated blood will be attached to your IV tube. The blood will then enter through your vein.  Your temperature, blood pressure, and pulse will be monitored regularly during the transfusion. This monitoring is done to detect early signs of a transfusion reaction.  If you have any signs or symptoms of a reaction, your transfusion will be stopped and you may be given medicine.  When the transfusion is complete, your IV tube will be removed.  Pressure may be applied to the IV site for a few minutes.  A bandage (dressing) will be applied. The procedure may vary among health care providers and hospitals. What happens after the procedure?  Your temperature, blood pressure, heart rate, breathing rate, and blood oxygen level will be monitored often.  Your blood may be tested to see how you are responding to the transfusion.  You may be warmed with fluids or blankets to maintain a normal body temperature. Summary  A blood transfusion is a procedure in which you receive donated blood, including plasma, platelets, and red blood cells, through an IV tube.  Your temperature, blood pressure, and pulse will be monitored before, during, and after the transfusion.  Your blood may  be tested after the transfusion to see how your body has responded. This information is not intended to replace advice given to you by your health care provider. Make sure you discuss any questions you have with your health care provider. Document Released: 01/15/2000 Document Revised: 12/04/2017 Document Reviewed: 10/15/2015 Elsevier Patient Education  2020 [employer].

## 2019-01-30 NOTE — Progress Notes (Signed)
Patient presented to treatment today with c/o 2 wk h/o dyspnea worse on exertion. States she noted the dyspnea began after experiencing left lower leg pain/cramping which patient states was similar to when she had a DVT. States she has been taking Eliquis as prescribed. Also, c/o right sided pruritic rash on her back. Area is excoriated, but no obvious rash could be identified. Sandi Mealy, PA-C came to infusion room to evaluate and no additional orders were received.

## 2019-01-30 NOTE — Telephone Encounter (Signed)
Spoke with Tedra Coupe in imaging, pt will only receive CT Angiogram instead of Angio and CT chest tomorrow as the Angio will cover diagnostic needs at this time per PA VAn.  CT Chest cancelled by imaging dept.  Pt made aware of Angio appt at 0800 and that CT chest was cancelled while in infusion suite, aware of NPO instructions.  PA [name] to assess pt in infusion suite before starting unit of blood today per MD Lindi Adie request (pt will not be receiving chemo tx today per PA [name]).

## 2019-01-30 NOTE — Progress Notes (Signed)
RN reviewed treatment plan, and parameters with MD.  Per Dr. Lindi Adie patient to receive Herceptin, Perjeta and 1 unit of PRBC's today only.  No [place] today.  Infusion nurse notified.

## 2019-01-30 NOTE — Telephone Encounter (Signed)
PA [name] alerted patient to results of CT Angio from today (01/30/2019) and released them into MyChart.

## 2019-01-30 NOTE — Telephone Encounter (Signed)
Per 12/29 los patient had an appt already scheduled for 12/30

## 2019-01-31 ENCOUNTER — Ambulatory Visit: Payer: 59

## 2019-01-31 ENCOUNTER — Ambulatory Visit (HOSPITAL_COMMUNITY): Payer: 59

## 2019-01-31 ENCOUNTER — Encounter (HOSPITAL_COMMUNITY): Payer: Self-pay

## 2019-01-31 ENCOUNTER — Ambulatory Visit (HOSPITAL_COMMUNITY): Admission: RE | Admit: 2019-01-31 | Payer: 59 | Source: Ambulatory Visit

## 2019-01-31 LAB — BPAM RBC
Blood Product Expiration Date: 202101172359
ISSUE DATE / TIME: 202012301058
Unit Type and Rh: 6200

## 2019-01-31 LAB — TYPE AND SCREEN
ABO/RH(D): A POS
Antibody Screen: NEGATIVE
Unit division: 0

## 2019-02-04 ENCOUNTER — Encounter: Payer: Self-pay | Admitting: *Deleted

## 2019-02-07 ENCOUNTER — Other Ambulatory Visit: Payer: Self-pay

## 2019-02-07 ENCOUNTER — Inpatient Hospital Stay: Payer: 59

## 2019-02-07 ENCOUNTER — Inpatient Hospital Stay: Payer: 59 | Attending: Hematology and Oncology

## 2019-02-07 ENCOUNTER — Encounter: Payer: Self-pay | Admitting: Adult Health

## 2019-02-07 ENCOUNTER — Inpatient Hospital Stay (HOSPITAL_BASED_OUTPATIENT_CLINIC_OR_DEPARTMENT_OTHER): Payer: 59 | Admitting: Adult Health

## 2019-02-07 VITALS — BP 103/82 | HR 96 | Temp 97.2°F | Resp 18 | Wt 173.1 lb

## 2019-02-07 DIAGNOSIS — R918 Other nonspecific abnormal finding of lung field: Secondary | ICD-10-CM | POA: Diagnosis not present

## 2019-02-07 DIAGNOSIS — Z9013 Acquired absence of bilateral breasts and nipples: Secondary | ICD-10-CM | POA: Diagnosis not present

## 2019-02-07 DIAGNOSIS — Z171 Estrogen receptor negative status [ER-]: Secondary | ICD-10-CM | POA: Insufficient documentation

## 2019-02-07 DIAGNOSIS — C7951 Secondary malignant neoplasm of bone: Secondary | ICD-10-CM | POA: Diagnosis not present

## 2019-02-07 DIAGNOSIS — Z9221 Personal history of antineoplastic chemotherapy: Secondary | ICD-10-CM | POA: Insufficient documentation

## 2019-02-07 DIAGNOSIS — C50512 Malignant neoplasm of lower-outer quadrant of left female breast: Secondary | ICD-10-CM

## 2019-02-07 DIAGNOSIS — Z923 Personal history of irradiation: Secondary | ICD-10-CM | POA: Insufficient documentation

## 2019-02-07 DIAGNOSIS — Z5112 Encounter for antineoplastic immunotherapy: Secondary | ICD-10-CM | POA: Diagnosis not present

## 2019-02-07 DIAGNOSIS — Z95828 Presence of other vascular implants and grafts: Secondary | ICD-10-CM

## 2019-02-07 LAB — CBC WITH DIFFERENTIAL (CANCER CENTER ONLY)
Abs Immature Granulocytes: 0.01 10*3/uL (ref 0.00–0.07)
Basophils Absolute: 0 10*3/uL (ref 0.0–0.1)
Basophils Relative: 1 %
Eosinophils Absolute: 0.1 10*3/uL (ref 0.0–0.5)
Eosinophils Relative: 3 %
HCT: 29.4 % — ABNORMAL LOW (ref 36.0–46.0)
Hemoglobin: 10 g/dL — ABNORMAL LOW (ref 12.0–15.0)
Immature Granulocytes: 0 %
Lymphocytes Relative: 40 %
Lymphs Abs: 1.2 10*3/uL (ref 0.7–4.0)
MCH: 33 pg (ref 26.0–34.0)
MCHC: 34 g/dL (ref 30.0–36.0)
MCV: 97 fL (ref 80.0–100.0)
Monocytes Absolute: 0.4 10*3/uL (ref 0.1–1.0)
Monocytes Relative: 13 %
Neutro Abs: 1.3 10*3/uL — ABNORMAL LOW (ref 1.7–7.7)
Neutrophils Relative %: 43 %
Platelet Count: 51 10*3/uL — ABNORMAL LOW (ref 150–400)
RBC: 3.03 MIL/uL — ABNORMAL LOW (ref 3.87–5.11)
RDW: 15.3 % (ref 11.5–15.5)
WBC Count: 3 10*3/uL — ABNORMAL LOW (ref 4.0–10.5)
nRBC: 0 % (ref 0.0–0.2)

## 2019-02-07 LAB — CMP (CANCER CENTER ONLY)
ALT: 14 U/L (ref 0–44)
AST: 25 U/L (ref 15–41)
Albumin: 3.3 g/dL — ABNORMAL LOW (ref 3.5–5.0)
Alkaline Phosphatase: 131 U/L — ABNORMAL HIGH (ref 38–126)
Anion gap: 9 (ref 5–15)
BUN: 11 mg/dL (ref 6–20)
CO2: 26 mmol/L (ref 22–32)
Calcium: 8.6 mg/dL — ABNORMAL LOW (ref 8.9–10.3)
Chloride: 105 mmol/L (ref 98–111)
Creatinine: 0.81 mg/dL (ref 0.44–1.00)
GFR, Est AFR Am: >60 mL/min (ref >60)
GFR, Estimated: >60 mL/min (ref >60)
Glucose, Bld: 111 mg/dL — ABNORMAL HIGH (ref 70–99)
Potassium: 3.5 mmol/L (ref 3.5–5.1)
Sodium: 140 mmol/L (ref 135–145)
Total Bilirubin: 0.4 mg/dL (ref 0.3–1.2)
Total Protein: 6.9 g/dL (ref 6.5–8.1)

## 2019-02-07 MED ORDER — SODIUM CHLORIDE 0.9% FLUSH
10.0000 mL | INTRAVENOUS | Status: DC | PRN
  Administered 2019-02-07: 10 mL via INTRAVENOUS
  Filled 2019-02-07: qty 10

## 2019-02-07 MED ORDER — HEPARIN SOD (PORK) LOCK FLUSH 100 UNIT/ML IV SOLN
500.0000 [IU] | Freq: Once | INTRAVENOUS | Status: AC | PRN
  Administered 2019-02-07: 500 [IU] via INTRAVENOUS
  Filled 2019-02-07: qty 5

## 2019-02-07 NOTE — Progress Notes (Signed)
[facility] Follow up:    Laura Noon, MD [place] [address]   DIAGNOSIS: Cancer Staging Malignant neoplasm of lower-outer quadrant of left breast of female, estrogen receptor negative ([place]) Staging form: Breast, AJCC 8th Edition - Clinical: Stage IIA (cT1c, cN1, cM0, G2, ER: Negative, PR: Negative, HER2: Positive) - Signed by Gardenia Phlegm, NP on 11/02/2016 - Pathologic: No Stage Recommended (ypT1b, pN0, cM0, G2, ER: Negative, PR: Negative, HER2: Positive) - Unsigned   SUMMARY OF ONCOLOGIC HISTORY: 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, estrogen receptor negative ([place])  10/20/2016 Mammogram   Mammogram and ultrasound of the left breast revealed 1.7 cm mass at 4:00 position, 6:30 position 5 x 4 x 4 mm mass, 6:00 position 5 cm nipple 7 x 6 x 11 mm, left axillary lymph node with thickened cortex, T1c N1 stage II a AJCC 8   10/24/2016 Initial Diagnosis   Left breast biopsy 6:30 position 3 cm from nipple: IDC grade 2, DCIS, ER 0%, PR 0%, Ki-67 15% HER-2 positive ratio 2.1; 4:00 position 3 cm from nipple: IDC grade 2, DCIS, ER 0%, PR 0%, Ki-67 35%, HER-2 positive ratio 2.02; left axillary lymph node biopsy positive   11/04/2016 - 02/17/2017 Neo-Adjuvant Chemotherapy   TCH Perjeta 6 cycles followed by Herceptin + Perjeta maintenance to be completed September 2019   11/30/2016 Genetic Testing   Negative genetic testing on the common hereditary cancer panel.  The Hereditary Gene Panel offered by [facility] includes sequencing and/or deletion duplication testing of the following 47 genes: APC, ATM, AXIN2, BARD1, BMPR1A, BRCA1, BRCA2, BRIP1, CDH1, CDK4, CDKN2A (p14ARF), CDKN2A (p16INK4a), CHEK2, CTNNA1, DICER1, EPCAM (Deletion/duplication testing only), GREM1 (promoter region deletion/duplication testing only), KIT, MEN1, MLH1, MSH2, MSH3, MSH6, MUTYH, NBN, NF1, NHTL1, PALB2, PDGFRA, PMS2, POLD1,  POLE, PTEN, RAD50, RAD51C, RAD51D, SDHB, SDHC, SDHD, SMAD4, SMARCA4. STK11, TP53, TSC1, TSC2, and VHL.  The following genes were evaluated for sequence changes only: SDHA and HOXB13 c.251G>A variant only. The report date is November 30, 2016.    03/27/2017 Surgery   Bilateral mastectomies: Left mastectomy: IDC grade 2 0.9 cm, nodes negative, right mastectomy benign, ER 0%, PR 0%, HER-2 positive ratio 2.6   05/08/2017 - 06/09/2017 Radiation Therapy   Adjuvant radiation therapy   10/23/2017 Miscellaneous   Neratinib discontinued after 4 weeks for severe diarrhea   07/25/2018 Relapse/Recurrence   MRI of right elbow showed bone lesion consistent with malignancy. PET scan showed bilateral pulmonary nodules and several lytic bone lesions compatible with metastatic disease. Brain MRI on 08/02/18 showed no evidence of metastatic disease.   08/02/2018 PET scan   Bilateral hypermetabolic lung nodules, LUL 1.3 cm with SUV 3.88, lingular nodule 1.4 cm SUV 3.7, central lingular nodule 1.2 cm SUV 9.76, right middle lobe nodule 1.5 cm SUV 9.9, lytic bone metastases inferior pubic ramus, sacrum, T12, right 11th rib.   08/08/2018 Procedure   Lung biopsy: metastatic carcinoma, HER-2 negative (0), ER/PR negative.   08/10/2018 -  Radiation Therapy   Palliative radiatio to the right humerus along the medial condyle   08/24/2018 - 09/05/2018 Radiation Therapy   Palliative radiation to the right 11th rib and right elbow   09/26/2018 - 12/04/2018 Chemotherapy   Carboplatin atezolizumab at [facility] with Dr. Janan Halter on [place] clinical trial stopped because of new T5 metastases (toxicities included myopathy required prednisone, immune mediated thyroiditis), right upper extremity DVT on apixaban  12/14/2018 -  Chemotherapy   The patient had pegfilgrastim-bmez (ZIEXTENZO) injection 6 mg, 6 mg, Subcutaneous,  Once, 0 of 1 cycle eriBULin mesylate (HALAVEN) 2.7 mg in sodium chloride 0.9 % 100 mL chemo infusion, 1.42  mg/m2 = 2.65 mg, Intravenous,  Once, 2 of 3 cycles Dose modification: 1.1 mg/m2 (original dose 1.4 mg/m2, Cycle 2, Reason: Dose not tolerated, Comment: neutropenic fever) Administration: 2.7 mg (12/14/2018), 2.7 mg (12/21/2018), 2 mg (01/03/2019), 2 mg (01/10/2019)  for chemotherapy treatment.    12/17/2018 -  Radiation Therapy   Palliative radiation to sternal, sacral & pelvic lesions and SRS for T3 and C7-T1 lesions.   Bone metastases ([place])  08/07/2018 Initial Diagnosis   Bone metastases ([place])   12/14/2018 -  Chemotherapy   The patient had pertuzumab (PERJETA) 420 mg in sodium chloride 0.9 % 250 mL chemo infusion, 420 mg (100 % of original dose 420 mg), Intravenous, Once, 3 of 6 cycles Dose modification: 420 mg (original dose 420 mg, Cycle 1, Reason: Provider Judgment) Administration: 420 mg (12/14/2018), 420 mg (01/10/2019), 420 mg (01/30/2019) trastuzumab-dkst (OGIVRI) 600 mg in sodium chloride 0.9 % 250 mL chemo infusion, 609 mg, Intravenous,  Once, 3 of 6 cycles Administration: 600 mg (12/14/2018), 450 mg (01/10/2019), 450 mg (01/30/2019)  for chemotherapy treatment.    12/17/2018 -  Radiation Therapy   Palliative radiation to sternal, sacral & pelvic lesions and SRS for T3 and C7-T1 lesions.     CURRENT THERAPY: Herceptin/Perjeta; Eribulin  INTERVAL HISTORY: Laura Anthony 44 y.o. female returns for evaluation prior to receiving Eribulin.  She is doing moderately well today and is hopeful that her counts will have improved from last week so that she can receive therapy.  She did have a recent CTA that showed that the cancer in her lungs had improved.    Patient Active Problem List   Diagnosis Date Noted  . Palliative care patient 01/06/2019  . Inadequate pain control 01/02/2019  . Acute embolism and thrombosis of unspecified deep veins of unspecified lower extremity ([place]) 12/26/2018  . Neutropenic fever ([place]) 12/26/2018  . Sorethroat 12/26/2018  . Stenosis of  brachiocephalic vein 15/37/9432  . 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 Neck pain 12/09/2018  . Hypothyroidism 12/09/2018  . Goals of care, counseling/discussion 12/06/2018  . Metastatic breast cancer ([place]) 08/08/2018  . Bone metastases ([place]) 08/07/2018  . Pain from bone metastases ([place]) 08/07/2018  . Family history of breast cancer 04/02/2018  . Family history of prostate cancer 04/02/2018  . History of therapeutic radiation 10/16/2017  . Acquired absence of both breasts 04/03/2017  . Breast cancer, stage 2, left ([place]) 03/27/2017  . Port-A-Cath in place 12/16/2016  . Genetic testing 12/06/2016  . Encounter for antineoplastic chemotherapy 11/25/2016  . Malignant neoplasm of lower-outer quadrant of left breast of female, estrogen receptor negative ([place]) 10/27/2016  . Breast lump on left side at 1 o'clock position 05/22/2015  . Migraine without aura or status migrainosus 05/29/2014  . Hyperlipidemia LDL goal <100 10/22/2013  . Depression 09/09/2013  . Rotator cuff impingement syndrome of left shoulder 07/12/2013  . ADD (attention deficit disorder) 01/09/2013  . Anxiety 12/13/2012  . Insomnia 12/13/2012    is allergic to sumatriptan; statins; and tape.  MEDICAL HISTORY: Past Medical History:  Diagnosis Date  . ADD (attention deficit disorder)   . Anemia   . Anxiety   . Breast cancer, left breast ([place])    S/P mastectomy 03/27/2017  .  DVT (deep venous thrombosis) ([place]) 2017   calf left - probably due to [facility] pills-took eliquis x3 mos, nonthing now  . High cholesterol   . Impingement syndrome of right shoulder 07/2013  . Migraine    "usually 1/month" (03/28/2017)  . PONV (postoperative nausea and vomiting)   . Right bicipital tenosynovitis 07/2013  . Rotator cuff impingement syndrome of right shoulder 07/12/2013  . Seizures ([place])    x 1 as a child - was never on anticonvulsants (03/28/2017)    SURGICAL HISTORY: Past Surgical History:   Procedure Laterality Date  . ADENOIDECTOMY  1981  . ANKLE ARTHROSCOPY Right   . BREAST BIOPSY Left 10/2016  . KNEE ARTHROSCOPY Right   . KNEE ARTHROSCOPY W/ ACL RECONSTRUCTION Left   . LIPOSUCTION WITH LIPOFILLING Bilateral 01/05/2018   Procedure: LIPOFILLING FROM ABDOMEN TO BILATERAL CHEST;  Surgeon: Irene Limbo, MD;  Location: [place];  Service: Plastics;  Laterality: Bilateral;  . MASTECTOMY Left 03/27/2017   NIPPLE SPARING MASTECTOMY WITH RADIOACTIVE SEED TARGETED LYMPH NODE EXCISION AND LEFT AXILLARY SENTINEL LYMPH NODE BIOPSY  . MASTECTOMY Right 03/27/2017   RIGHT PROPHYLACTIC NIPPLE SPARING MASTECTOMY  . NIPPLE SPARING MASTECTOMY Right 03/27/2017   Procedure: RIGHT PROPHYLACTIC NIPPLE SPARING MASTECTOMY;  Surgeon: Rolm Bookbinder, MD;  Location: [place];  Service: General;  Laterality: Right;  . PORT-A-CATH REMOVAL Right 01/05/2018   Procedure: REMOVAL RIGHT CHEST PORT;  Surgeon: Irene Limbo, MD;  Location: [place];  Service: Plastics;  Laterality: Right;  . PORTACATH PLACEMENT N/A 11/01/2016   Procedure: INSERTION PORT-A-CATH WITH [place];  Surgeon: Rolm Bookbinder, MD;  Location: [place];  Service: General;  Laterality: N/A;  . PORTACATH PLACEMENT N/A 08/23/2018   Procedure: INSERTION PORT-A-CATH WITH ULTRASOUND;  Surgeon: Rolm Bookbinder, MD;  Location: [place];  Service: General;  Laterality: N/A;  . RADIOACTIVE SEED GUIDED AXILLARY SENTINEL LYMPH NODE Left 03/27/2017   Procedure: LEFT NIPPLE SPARING MASTECTOMY WITH RADIOACTIVE SEED TARGETED LYMPH NODE EXCISION AND LEFT AXILLARY SENTINEL LYMPH NODE BIOPSY;  Surgeon: Rolm Bookbinder, MD;  Location: [place];  Service: General;  Laterality: Left;  REQUESTS RNFA  . RECONSTRUCTION BREAST IMMEDIATE / DELAYED W/ TISSUE EXPANDER Bilateral 03/27/2017   BILATERAL BREAST RECONSTRUCTION WITH PLACEMENT OF TISSUE EXPANDER AND ALLODERM  .  REMOVAL OF BILATERAL TISSUE EXPANDERS WITH PLACEMENT OF BILATERAL BREAST IMPLANTS Bilateral 01/05/2018   Procedure: REMOVAL OF BILATERAL TISSUE EXPANDERS WITH PLACEMENT OF BILATERAL BREAST IMPLANTS;  Surgeon: Irene Limbo, MD;  Location: [place];  Service: Plastics;  Laterality: Bilateral;  . SEPTOPLASTY WITH ETHMOIDECTOMY, AND MAXILLARY ANTROSTOMY  10/29/2010   bilat. max. antrostomy with left max. stripping; left ant. ethmoidectomy; right total ethmoidectomy; sphenoidotomy  . SHOULDER ARTHROSCOPY WITH SUBACROMIAL DECOMPRESSION AND BICEP TENDON REPAIR Right 07/12/2013   Procedure: RIGHT SHOULDER ARTHROSCOPY DEBRIDEMENT EXTENSIVE DECOMPRESSION SUBACROMIAL PARTIAL ACROMIOPLASTY;  Surgeon: Johnny Bridge, MD;  Location: [place];  Service: Orthopedics;  Laterality: Right;  . WRIST ARTHROSCOPY  01/17/2012   Procedure: ARTHROSCOPY WRIST; right wrist Surgeon: Tennis Must, MD;  Location: [place];  Service: Orthopedics;  Laterality: Right;  RIGHT WRIST ARTHROSCOPY WITH TRIANGULAR FIBROCARTILAGE COMPLEX REPAIR AND DEBRIDEMENT     SOCIAL HISTORY: Social History   Socioeconomic History  . Marital status: Married    Spouse name: Not on file  . Number of children: Not on file  . Years of education: Not on file  . Highest education level: Not on  file  Occupational History  . Not on file  Tobacco Use  . Smoking status: Never Smoker  . Smokeless tobacco: Never Used  Substance and Sexual Activity  . Alcohol use: Yes    Comment: Drinks very rare  . Drug use: No  . Sexual activity: Yes    Birth control/protection: Surgical    Comment: husband has had a vasectomy  Other Topics Concern  . Not on file  Social History Narrative  . Not on file   Social Determinants of Health   Financial Resource Strain:   . Difficulty of Paying Living Expenses: Not on file  Food Insecurity:   . Worried About Charity fundraiser in the Last Year: Not on file   . Ran Out of Food in the Last Year: Not on file  Transportation Needs:   . Lack of Transportation (Medical): Not on file  . Lack of Transportation (Non-Medical): Not on file  Physical Activity:   . Days of Exercise per Week: Not on file  . Minutes of Exercise per Session: Not on file  Stress:   . Feeling of Stress : Not on file  Social Connections:   . Frequency of Communication with Friends and Family: Not on file  . Frequency of Social Gatherings with Friends and Family: Not on file  . Attends Religious Services: Not on file  . Active Member of Clubs or Organizations: Not on file  . Attends Archivist Meetings: Not on file  . Marital Status: Not on file  Intimate Partner Violence:   . Fear of Current or Ex-Partner: Not on file  . Emotionally Abused: Not on file  . Physically Abused: Not on file  . Sexually Abused: Not on file    FAMILY HISTORY: Family History  Problem Relation Age of Onset  . Breast cancer Mother 16       triple negative  . Leukemia Father   . Lung cancer Father   . Heart attack Maternal Uncle   . Prostate cancer Paternal Uncle   . COPD Paternal Grandmother   . Heart disease Paternal Grandfather   . Prostate cancer Paternal Uncle   . Leukemia Cousin     Review of Systems  Constitutional: Positive for fatigue. Negative for appetite change, chills and unexpected weight change.  Eyes: Negative for eye problems and icterus.  Respiratory: Negative for chest tightness, cough and shortness of breath.   Cardiovascular: Negative for chest pain, leg swelling and palpitations.  Gastrointestinal: Negative for abdominal distention, abdominal pain, constipation and diarrhea.  Musculoskeletal: Negative for arthralgias.  Neurological: Negative for dizziness, extremity weakness, headaches and numbness.  Hematological: Negative for adenopathy. Does not bruise/bleed easily.  Psychiatric/Behavioral: Negative for depression. The patient is not nervous/anxious.        PHYSICAL EXAMINATION  ECOG PERFORMANCE STATUS: 1 - Symptomatic but completely ambulatory  Vitals:   02/07/19 1405  BP: 103/82  Pulse: 96  Resp: 18  Temp: (!) 97.2 F (36.2 C)  SpO2: 98%    Physical Exam Constitutional:      General: She is not in acute distress.    Appearance: Normal appearance. She is not toxic-appearing.  HENT:     Head: Normocephalic and atraumatic.     Mouth/Throat:     Comments: Mask in place Eyes:     General: No scleral icterus. Cardiovascular:     Rate and Rhythm: Normal rate and regular rhythm.     Heart sounds: Normal heart sounds.  Pulmonary:  Effort: Pulmonary effort is normal.     Breath sounds: Normal breath sounds.  Abdominal:     General: Abdomen is flat. There is no distension.     Palpations: Abdomen is soft.     Tenderness: There is no abdominal tenderness.  Musculoskeletal:        General: No swelling.     Cervical back: Neck supple.  Lymphadenopathy:     Cervical: No cervical adenopathy.  Skin:    General: Skin is warm and dry.     Capillary Refill: Capillary refill takes less than 2 seconds.     Findings: No rash.  Neurological:     General: No focal deficit present.     Mental Status: She is alert and oriented to person, place, and time.  Psychiatric:        Mood and Affect: Mood normal.        Behavior: Behavior normal.     LABORATORY DATA:  CBC    Component Value Date/Time   WBC 3.0 (L) 02/07/2019 1335   WBC 11.0 (H) 01/04/2019 1649   RBC 3.03 (L) 02/07/2019 1335   HGB 10.0 (L) 02/07/2019 1335   HGB 11.1 (L) 01/27/2017 0802   HCT 29.4 (L) 02/07/2019 1335   HCT 33.4 (L) 01/27/2017 0802   PLT 51 (L) 02/07/2019 1335   PLT 170 01/27/2017 0802   MCV 97.0 02/07/2019 1335   MCV 99.0 01/27/2017 0802   MCH 33.0 02/07/2019 1335   MCHC 34.0 02/07/2019 1335   RDW 15.3 02/07/2019 1335   RDW 18.2 (H) 01/27/2017 0802   LYMPHSABS 1.2 02/07/2019 1335   LYMPHSABS 1.7 01/27/2017 0802   MONOABS 0.4 02/07/2019  1335   MONOABS 0.5 01/27/2017 0802   EOSABS 0.1 02/07/2019 1335   EOSABS 0.0 01/27/2017 0802   BASOSABS 0.0 02/07/2019 1335   BASOSABS 0.0 01/27/2017 0802    CMP     Component Value Date/Time   NA 140 02/07/2019 1335   NA 138 01/27/2017 0802   K 3.5 02/07/2019 1335   K 4.1 01/27/2017 0802   CL 105 02/07/2019 1335   CO2 26 02/07/2019 1335   CO2 26 01/27/2017 0802   GLUCOSE 111 (H) 02/07/2019 1335   GLUCOSE 90 01/27/2017 0802   BUN 11 02/07/2019 1335   BUN 14.0 01/27/2017 0802   CREATININE 0.81 02/07/2019 1335   CREATININE 0.7 01/27/2017 0802   CALCIUM 8.6 (L) 02/07/2019 1335   CALCIUM 8.6 01/27/2017 0802   PROT 6.9 02/07/2019 1335   PROT 6.5 01/27/2017 0802   ALBUMIN 3.3 (L) 02/07/2019 1335   ALBUMIN 3.5 01/27/2017 0802   AST 25 02/07/2019 1335   AST 22 01/27/2017 0802   ALT 14 02/07/2019 1335   ALT 23 01/27/2017 0802   ALKPHOS 131 (H) 02/07/2019 1335   ALKPHOS 85 01/27/2017 0802   BILITOT 0.4 02/07/2019 1335   BILITOT 0.23 01/27/2017 0802   GFRNONAA >60 02/07/2019 1335   GFRAA >60 02/07/2019 1335       PENDING LABS:   RADIOGRAPHIC STUDIES:  No results found.   PATHOLOGY:     ASSESSMENT and THERAPY PLAN:   Malignant neoplasm of lower-outer quadrant of left breast of female, estrogen receptor negative (H[facility]) Mammogram and ultrasound of the left breast revealed 1.7 cm mass at 4:00 position, 6:30 position 5 x 4 x 4 mm mass, 6:00 position 5 cm nipple 7 x 6 x 11 mm, left axillary lymph node with thickened cortex, T1c N1 stage II a AJCC 8  10/24/2016: Left breast biopsy 6:30 position 3 cm from nipple: IDC grade 2, DCIS, ER 0%, PR 0%, Ki-67 15% HER-2 positive ratio 2.1; 4:00 position 3 cm from nipple: IDC grade 2, DCIS, ER 0%, PR 0%, Ki-67 35%, HER-2 positive ratio 2.02 Lymph node biopsy positive  Treatment Summary: 1. Neoadjuvant chemotherapy with TCHPcompleted 02/17/2017 this would be followed by Herceptinand Perjetamaintenance for 1 yearcompleted  September 2019 2.Bilateral mastectomies 03/28/2016:Bilateral mastectomies: Left mastectomy: IDC grade 2 0.9 cm, nodes negative, right mastectomy benign, ER 0%, PR 0%, HER-2 positive ratio 2.6 3.Adjuvant radiation4/08/2017 to 06/09/2017 4.Neratinib started 10/12/2017 discontinued due to diarrhea 5. Elbow fracture: Due to metastatic disease, palliative radiation therapy 6. Carboplatin atezolizumab at [facility] on a clinical trial [facility] 043 stopped for progression 12/04/2018 ---------------------------------------------------------------------------------------------------------------- Lung nodule biopsy: Metastatic breast cancer triple negative  CT CAP 12/03/2018: Progression of lung nodules and bone metastases done at [facility] Current treatment: Halaven days 1 and 8 every 3 weeks with Herceptin and Perjeta every 3 weeks, today cycle 2 day 8 [place]  She is here today for evaluation prior to receiving Halaven.  Her plt count is too low to receive treatment.  She is feeling better after blood transfusion.  She will return in 1 week for repeat labs, f/u with Dr. Lindi Adie, and potentially Halaven.   All questions were answered. The patient knows to call the clinic with any problems, questions or concerns. We can certainly see the patient much sooner if necessary.  A total of (20) minutes of face-to-face time was spent with this patient with greater than 50% of that time in counseling and care-coordination.  This note was electronically signed. Scot Dock, NP 02/09/2019

## 2019-02-07 NOTE — Addendum Note (Signed)
Addended by: Kasandra Knudsen A on: 02/07/2019 02:51 PM   Modules accepted: Orders

## 2019-02-07 NOTE — Patient Instructions (Signed)

## 2019-02-08 ENCOUNTER — Ambulatory Visit: Payer: 59

## 2019-02-08 ENCOUNTER — Other Ambulatory Visit: Payer: 59

## 2019-02-08 ENCOUNTER — Ambulatory Visit: Payer: 59 | Admitting: Hematology and Oncology

## 2019-02-09 NOTE — Assessment & Plan Note (Signed)
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 Neoadjuvant chemotherapy with TCHPcompleted 02/17/2017 this would be followed by Herceptinand Perjetamaintenance for 1 yearcompleted September 2019 2.Bilateral mastectomies 03/28/2016:Bilateral mastectomies: Left mastectomy: IDC grade 2 0.9 cm, nodes negative, right mastectomy benign, ER 0%, PR 0%, HER-2 positive ratio 2.6 3.Adjuvant radiation4/08/2017 to 06/09/2017 4.Neratinib started 10/12/2017 discontinued due to diarrhea 5. Elbow fracture: Due to metastatic disease, palliative radiation therapy 6. Carboplatin atezolizumab at [facility] on a clinical trial [facility] 043 stopped for progression 12/04/2018 ---------------------------------------------------------------------------------------------------------------- Lung nodule biopsy: Metastatic breast cancer triple negative  CT CAP 12/03/2018: Progression of lung nodules and bone metastases done at [facility] Current treatment: Halaven days 1 and 8 every 3 weeks with Herceptin and Perjeta every 3 weeks, today cycle 2 day 8 Hal[place]  She is here today for evaluation prior to receiving Halaven.  Her plt count is too low to receive treatment.  She is feeling better after blood transfusion.  She will return in 1 week for repeat labs, f/u with Dr. Lindi Adie, and potentially Halaven.

## 2019-02-11 ENCOUNTER — Telehealth: Payer: Self-pay | Admitting: Radiation Oncology

## 2019-02-11 NOTE — Telephone Encounter (Signed)
Faxed completed prior authorization form for Methadone to [facility] at [phone]. Fax confirmation of delivery obtained. Requested completed form be scanned into EMR.

## 2019-02-13 ENCOUNTER — Telehealth: Payer: Self-pay | Admitting: Radiation Oncology

## 2019-02-13 NOTE — Telephone Encounter (Signed)
  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  12/13/2018-01/07/2019:  The sternum, pelvis and sacrum were treated to 37.5 Gy in 15 fractions  08/22/2018-09/05/2018:  30 Gy in 10 fractions to the right 11th rib  08/08/2018-08/21/2018:  30 Gy in 10 fractions to the right elbow  05/08/2017 - 06/21/2017: The patient initially received a dose of 50.4 Gy in 28 fractions to the leftchest wall,supraclavicular region, and posterior axillary region. This was delivered using a 3-D conformal, 4 field technique. The patient then received a boost to the mastectomy scar. This delivered an additional 10 Gy in 5 fractions using an en face electron field. The total dose was 60.4 Gy.   Narrative:  The patient was contacted today for routine follow-up. During treatment she did very well with radiotherapy and did not have significant desquamation. She reports she is doing much better than she had been the last time I spoke with her. She's planning to have a dose reduction in her chemotherapy due to thrombocytopenia but is having much better pain relief than of recent with the help of methadone.  Impression/Plan: 1. 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 She will continue with Dr. Lindi Adie for systemic therapy and we will follow along expectantly. For pain she will also continue with methadone.      Carola Rhine,  PAC

## 2019-02-14 ENCOUNTER — Ambulatory Visit: Payer: 59 | Admitting: Hematology and Oncology

## 2019-02-14 ENCOUNTER — Other Ambulatory Visit: Payer: 59

## 2019-02-14 ENCOUNTER — Ambulatory Visit: Payer: 59

## 2019-02-14 ENCOUNTER — Other Ambulatory Visit: Payer: Self-pay | Admitting: Internal Medicine

## 2019-02-14 ENCOUNTER — Telehealth: Payer: Self-pay | Admitting: Internal Medicine

## 2019-02-14 ENCOUNTER — Other Ambulatory Visit: Payer: Self-pay

## 2019-02-14 NOTE — Telephone Encounter (Signed)
10:05am:   TC to patient for previously scheduled ZOOM meeting. No answer on cell. I left a message with my contact information and requested a call back to reschedule. I will send her an e-mail as well.  Violeta Gelinas NP-C [phone]

## 2019-02-15 ENCOUNTER — Encounter: Payer: Self-pay | Admitting: Hematology and Oncology

## 2019-02-15 ENCOUNTER — Inpatient Hospital Stay: Payer: 59

## 2019-02-15 ENCOUNTER — Other Ambulatory Visit: Payer: Self-pay

## 2019-02-15 ENCOUNTER — Inpatient Hospital Stay (HOSPITAL_BASED_OUTPATIENT_CLINIC_OR_DEPARTMENT_OTHER): Payer: 59 | Admitting: Adult Health

## 2019-02-15 ENCOUNTER — Encounter: Payer: Self-pay | Admitting: *Deleted

## 2019-02-15 ENCOUNTER — Encounter: Payer: Self-pay | Admitting: Adult Health

## 2019-02-15 VITALS — HR 87

## 2019-02-15 VITALS — BP 100/84 | HR 109 | Temp 99.1°F | Resp 18 | Ht 67.0 in | Wt 171.4 lb

## 2019-02-15 DIAGNOSIS — C50512 Malignant neoplasm of lower-outer quadrant of left female breast: Secondary | ICD-10-CM

## 2019-02-15 DIAGNOSIS — C7951 Secondary malignant neoplasm of bone: Secondary | ICD-10-CM | POA: Diagnosis not present

## 2019-02-15 DIAGNOSIS — Z7189 Other specified counseling: Secondary | ICD-10-CM

## 2019-02-15 DIAGNOSIS — Z171 Estrogen receptor negative status [ER-]: Secondary | ICD-10-CM

## 2019-02-15 DIAGNOSIS — Z95828 Presence of other vascular implants and grafts: Secondary | ICD-10-CM

## 2019-02-15 LAB — CMP (CANCER CENTER ONLY)
ALT: 14 U/L (ref 0–44)
AST: 23 U/L (ref 15–41)
Albumin: 3.2 g/dL — ABNORMAL LOW (ref 3.5–5.0)
Alkaline Phosphatase: 136 U/L — ABNORMAL HIGH (ref 38–126)
Anion gap: 10 (ref 5–15)
BUN: 11 mg/dL (ref 6–20)
CO2: 24 mmol/L (ref 22–32)
Calcium: 8.5 mg/dL — ABNORMAL LOW (ref 8.9–10.3)
Chloride: 105 mmol/L (ref 98–111)
Creatinine: 0.8 mg/dL (ref 0.44–1.00)
GFR, Est AFR Am: >60 mL/min (ref >60)
GFR, Estimated: >60 mL/min (ref >60)
Glucose, Bld: 114 mg/dL — ABNORMAL HIGH (ref 70–99)
Potassium: 3.6 mmol/L (ref 3.5–5.1)
Sodium: 139 mmol/L (ref 135–145)
Total Bilirubin: 0.3 mg/dL (ref 0.3–1.2)
Total Protein: 7.1 g/dL (ref 6.5–8.1)

## 2019-02-15 LAB — CBC WITH DIFFERENTIAL (CANCER CENTER ONLY)
Abs Immature Granulocytes: 0 10*3/uL (ref 0.00–0.07)
Basophils Absolute: 0 10*3/uL (ref 0.0–0.1)
Basophils Relative: 1 %
Eosinophils Absolute: 0.1 10*3/uL (ref 0.0–0.5)
Eosinophils Relative: 2 %
HCT: 29.6 % — ABNORMAL LOW (ref 36.0–46.0)
Hemoglobin: 10 g/dL — ABNORMAL LOW (ref 12.0–15.0)
Immature Granulocytes: 0 %
Lymphocytes Relative: 44 %
Lymphs Abs: 1.4 10*3/uL (ref 0.7–4.0)
MCH: 33.4 pg (ref 26.0–34.0)
MCHC: 33.8 g/dL (ref 30.0–36.0)
MCV: 99 fL (ref 80.0–100.0)
Monocytes Absolute: 0.5 10*3/uL (ref 0.1–1.0)
Monocytes Relative: 14 %
Neutro Abs: 1.3 10*3/uL — ABNORMAL LOW (ref 1.7–7.7)
Neutrophils Relative %: 39 %
Platelet Count: 90 10*3/uL — ABNORMAL LOW (ref 150–400)
RBC: 2.99 MIL/uL — ABNORMAL LOW (ref 3.87–5.11)
RDW: 14.9 % (ref 11.5–15.5)
WBC Count: 3.3 10*3/uL — ABNORMAL LOW (ref 4.0–10.5)
nRBC: 0 % (ref 0.0–0.2)

## 2019-02-15 MED ORDER — HEPARIN SOD (PORK) LOCK FLUSH 100 UNIT/ML IV SOLN
500.0000 [IU] | Freq: Once | INTRAVENOUS | Status: AC | PRN
  Administered 2019-02-15: 500 [IU]
  Filled 2019-02-15: qty 5

## 2019-02-15 MED ORDER — SODIUM CHLORIDE 0.9% FLUSH
10.0000 mL | INTRAVENOUS | Status: DC | PRN
  Administered 2019-02-15: 10 mL via INTRAVENOUS
  Filled 2019-02-15: qty 10

## 2019-02-15 MED ORDER — PROCHLORPERAZINE MALEATE 10 MG PO TABS
10.0000 mg | ORAL_TABLET | Freq: Once | ORAL | Status: AC
  Administered 2019-02-15: 10 mg via ORAL

## 2019-02-15 MED ORDER — SODIUM CHLORIDE 0.9% FLUSH
10.0000 mL | INTRAVENOUS | Status: DC | PRN
  Administered 2019-02-15: 10 mL
  Filled 2019-02-15: qty 10

## 2019-02-15 MED ORDER — SODIUM CHLORIDE 0.9 % IV SOLN
0.7000 mg/m2 | Freq: Once | INTRAVENOUS | Status: AC
  Administered 2019-02-15: 1.35 mg via INTRAVENOUS
  Filled 2019-02-15: qty 2.7

## 2019-02-15 MED ORDER — PROCHLORPERAZINE MALEATE 10 MG PO TABS
ORAL_TABLET | ORAL | Status: AC
  Filled 2019-02-15: qty 1

## 2019-02-15 MED ORDER — SODIUM CHLORIDE 0.9 % IV SOLN
Freq: Once | INTRAVENOUS | Status: AC
  Filled 2019-02-15: qty 250

## 2019-02-15 NOTE — Patient Instructions (Signed)
[facility] Discharge Instructions for Patients Receiving Chemotherapy  Today you received the following chemotherapy agents Halaven  To help prevent nausea and vomiting after your treatment, we encourage you to take your nausea medication as directed If you develop nausea and vomiting that is not controlled by your nausea medication, call the clinic.   BELOW ARE SYMPTOMS THAT SHOULD BE REPORTED IMMEDIATELY:  *FEVER GREATER THAN 100.5 F  *CHILLS WITH OR WITHOUT FEVER  NAUSEA AND VOMITING THAT IS NOT CONTROLLED WITH YOUR NAUSEA MEDICATION  *UNUSUAL SHORTNESS OF BREATH  *UNUSUAL BRUISING OR BLEEDING  TENDERNESS IN MOUTH AND THROAT WITH OR WITHOUT PRESENCE OF ULCERS  *URINARY PROBLEMS  *BOWEL PROBLEMS  UNUSUAL RASH Items with * indicate a potential emergency and should be followed up as soon as possible.  Feel free to call the clinic should you have any questions or concerns. The clinic phone number is [phone].  Please show the CHEMO ALERT CARD at check-in to the Emergency Department and triage nurse.   

## 2019-02-15 NOTE — Progress Notes (Signed)
OK to treat today with labs per Wilber Bihari, NP.

## 2019-02-15 NOTE — Progress Notes (Addendum)
[facility] Follow up:    Laura Noon, MD [place] [address]   DIAGNOSIS: Cancer Staging Malignant neoplasm of lower-outer quadrant of left breast of female, estrogen receptor negative ([place]) Staging form: Breast, AJCC 8th Edition - Clinical: Stage IIA (cT1c, cN1, cM0, G2, ER: Negative, PR: Negative, HER2: Positive) - Signed by Gardenia Phlegm, NP on 11/02/2016 - Pathologic: No Stage Recommended (ypT1b, pN0, cM0, G2, ER: Negative, PR: Negative, HER2: Positive) - Unsigned   SUMMARY OF ONCOLOGIC HISTORY: 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, estrogen receptor negative ([place])  10/20/2016 Mammogram   Mammogram and ultrasound of the left breast revealed 1.7 cm mass at 4:00 position, 6:30 position 5 x 4 x 4 mm mass, 6:00 position 5 cm nipple 7 x 6 x 11 mm, left axillary lymph node with thickened cortex, T1c N1 stage II a AJCC 8   10/24/2016 Initial Diagnosis   Left breast biopsy 6:30 position 3 cm from nipple: IDC grade 2, DCIS, ER 0%, PR 0%, Ki-67 15% HER-2 positive ratio 2.1; 4:00 position 3 cm from nipple: IDC grade 2, DCIS, ER 0%, PR 0%, Ki-67 35%, HER-2 positive ratio 2.02; left axillary lymph node biopsy positive   11/04/2016 - 02/17/2017 Neo-Adjuvant Chemotherapy   TCH Perjeta 6 cycles followed by Herceptin + Perjeta maintenance to be completed September 2019   11/30/2016 Genetic Testing   Negative genetic testing on the common hereditary cancer panel.  The Hereditary Gene Panel offered by [facility] includes sequencing and/or deletion duplication testing of the following 47 genes: APC, ATM, AXIN2, BARD1, BMPR1A, BRCA1, BRCA2, BRIP1, CDH1, CDK4, CDKN2A (p14ARF), CDKN2A (p16INK4a), CHEK2, CTNNA1, DICER1, EPCAM (Deletion/duplication testing only), GREM1 (promoter region deletion/duplication testing only), KIT, MEN1, MLH1, MSH2, MSH3, MSH6, MUTYH, NBN, NF1, NHTL1, PALB2, PDGFRA, PMS2, POLD1,  POLE, PTEN, RAD50, RAD51C, RAD51D, SDHB, SDHC, SDHD, SMAD4, SMARCA4. STK11, TP53, TSC1, TSC2, and VHL.  The following genes were evaluated for sequence changes only: SDHA and HOXB13 c.251G>A variant only. The report date is November 30, 2016.    03/27/2017 Surgery   Bilateral mastectomies: Left mastectomy: IDC grade 2 0.9 cm, nodes negative, right mastectomy benign, ER 0%, PR 0%, HER-2 positive ratio 2.6   05/08/2017 - 06/09/2017 Radiation Therapy   Adjuvant radiation therapy   10/23/2017 Miscellaneous   Neratinib discontinued after 4 weeks for severe diarrhea   07/25/2018 Relapse/Recurrence   MRI of right elbow showed bone lesion consistent with malignancy. PET scan showed bilateral pulmonary nodules and several lytic bone lesions compatible with metastatic disease. Brain MRI on 08/02/18 showed no evidence of metastatic disease.   08/02/2018 PET scan   Bilateral hypermetabolic lung nodules, LUL 1.3 cm with SUV 3.88, lingular nodule 1.4 cm SUV 3.7, central lingular nodule 1.2 cm SUV 9.76, right middle lobe nodule 1.5 cm SUV 9.9, lytic bone metastases inferior pubic ramus, sacrum, T12, right 11th rib.   08/08/2018 Procedure   Lung biopsy: metastatic carcinoma, HER-2 negative (0), ER/PR negative.   08/10/2018 -  Radiation Therapy   Palliative radiatio to the right humerus along the medial condyle   08/24/2018 - 09/05/2018 Radiation Therapy   Palliative radiation to the right 11th rib and right elbow   09/26/2018 - 12/04/2018 Chemotherapy   Carboplatin atezolizumab at [facility] with Dr. Janan Halter on [place] clinical trial stopped because of new T5 metastases (toxicities included myopathy required prednisone, immune mediated thyroiditis), right upper extremity DVT on apixaban  12/14/2018 -  Chemotherapy   The patient had pegfilgrastim-bmez (ZIEXTENZO) injection 6 mg, 6 mg, Subcutaneous,  Once, 1 of 2 cycles eriBULin mesylate (HALAVEN) 2.7 mg in sodium chloride 0.9 % 100 mL chemo infusion, 1.42  mg/m2 = 2.65 mg, Intravenous,  Once, 3 of 4 cycles Dose modification: 1.1 mg/m2 (original dose 1.4 mg/m2, Cycle 2, Reason: Dose not tolerated, Comment: neutropenic fever), 0.7 mg/m2 (original dose 1.4 mg/m2, Cycle 4, Reason: Provider Judgment), 0.7 mg/m2 (original dose 1.4 mg/m2, Cycle 4, Reason: Provider Judgment) Administration: 2.7 mg (12/14/2018), 2.7 mg (12/21/2018), 2 mg (01/03/2019), 2 mg (01/10/2019)  for chemotherapy treatment.    12/17/2018 -  Radiation Therapy   Palliative radiation to sternal, sacral & pelvic lesions and SRS for T3 and C7-T1 lesions.   Bone metastases ([place])  08/07/2018 Initial Diagnosis   Bone metastases ([place])   12/14/2018 -  Chemotherapy   The patient had pertuzumab (PERJETA) 420 mg in sodium chloride 0.9 % 250 mL chemo infusion, 420 mg (100 % of original dose 420 mg), Intravenous, Once, 3 of 6 cycles Dose modification: 420 mg (original dose 420 mg, Cycle 1, Reason: Provider Judgment) Administration: 420 mg (12/14/2018), 420 mg (01/10/2019), 420 mg (01/30/2019) trastuzumab-dkst (OGIVRI) 600 mg in sodium chloride 0.9 % 250 mL chemo infusion, 609 mg, Intravenous,  Once, 3 of 6 cycles Administration: 600 mg (12/14/2018), 450 mg (01/10/2019), 450 mg (01/30/2019)  for chemotherapy treatment.    12/17/2018 -  Radiation Therapy   Palliative radiation to sternal, sacral & pelvic lesions and SRS for T3 and C7-T1 lesions.     CURRENT THERAPY: Eribulin/Herceptin/Perjeta  INTERVAL HISTORY: Laura Anthony 44 y.o. female returns for evaluation prior to receiving Eribulin today.  We have been holding treatment the last couple of weeks due to thrombocytopenia.  Today her platelet count is 90.  Her ANC is 1.3.  She is feeling well.  She wants to know if it is ok to take Papaya Leaf Extract.      Patient Active Problem List   Diagnosis Date Noted  . Palliative care patient 01/06/2019  . Inadequate pain control 01/02/2019  . Acute embolism and thrombosis of  unspecified deep veins of unspecified lower extremity ([place]) 12/26/2018  . Neutropenic fever ([place]) 12/26/2018  . Sorethroat 12/26/2018  . Stenosis of brachiocephalic vein 94/49/6759  . 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 Neck pain 12/09/2018  . Hypothyroidism 12/09/2018  . Goals of care, counseling/discussion 12/06/2018  . Metastatic breast cancer ([name]) 08/08/2018  . Bone metastases ([place]) 08/07/2018  . Pain from bone metastases ([place]) 08/07/2018  . Family history of breast cancer 04/02/2018  . Family history of prostate cancer 04/02/2018  . History of therapeutic radiation 10/16/2017  . Acquired absence of both breasts 04/03/2017  . Breast cancer, stage 2, left ([place]) 03/27/2017  . Port-A-Cath in place 12/16/2016  . Genetic testing 12/06/2016  . Encounter for antineoplastic chemotherapy 11/25/2016  . Malignant neoplasm of lower-outer quadrant of left breast of female, estrogen receptor negative ([place]) 10/27/2016  . Breast lump on left side at 1 o'clock position 05/22/2015  . Migraine without aura or status migrainosus 05/29/2014  . Hyperlipidemia LDL goal <100 10/22/2013  . Depression 09/09/2013  . Rotator cuff impingement syndrome of left shoulder 07/12/2013  . ADD (attention deficit disorder) 01/09/2013  . Anxiety 12/13/2012  . Insomnia 12/13/2012    is allergic to sumatriptan; statins; and tape.  MEDICAL HISTORY: Past Medical History:  Diagnosis Date  .  ADD (attention deficit disorder)   . Anemia   . Anxiety   . Breast cancer, left breast ([place])    S/P mastectomy 03/27/2017  . DVT (deep venous thrombosis) ([place]) 2017   calf left - probably due to [facility] pills-took eliquis x3 mos, nonthing now  . High cholesterol   . Impingement syndrome of right shoulder 07/2013  . Migraine    "usually 1/month" (03/28/2017)  . PONV (postoperative nausea and vomiting)   . Right bicipital tenosynovitis 07/2013  . Rotator cuff impingement syndrome of right  shoulder 07/12/2013  . Seizures ([place])    x 1 as a child - was never on anticonvulsants (03/28/2017)    SURGICAL HISTORY: Past Surgical History:  Procedure Laterality Date  . ADENOIDECTOMY  1981  . ANKLE ARTHROSCOPY Right   . BREAST BIOPSY Left 10/2016  . KNEE ARTHROSCOPY Right   . KNEE ARTHROSCOPY W/ ACL RECONSTRUCTION Left   . LIPOSUCTION WITH LIPOFILLING Bilateral 01/05/2018   Procedure: LIPOFILLING FROM ABDOMEN TO BILATERAL CHEST;  Surgeon: Irene Limbo, MD;  Location: [place];  Service: Plastics;  Laterality: Bilateral;  . MASTECTOMY Left 03/27/2017   NIPPLE SPARING MASTECTOMY WITH RADIOACTIVE SEED TARGETED LYMPH NODE EXCISION AND LEFT AXILLARY SENTINEL LYMPH NODE BIOPSY  . MASTECTOMY Right 03/27/2017   RIGHT PROPHYLACTIC NIPPLE SPARING MASTECTOMY  . NIPPLE SPARING MASTECTOMY Right 03/27/2017   Procedure: RIGHT PROPHYLACTIC NIPPLE SPARING MASTECTOMY;  Surgeon: Rolm Bookbinder, MD;  Location: [name];  Service: General;  Laterality: Right;  . PORT-A-CATH REMOVAL Right 01/05/2018   Procedure: REMOVAL RIGHT CHEST PORT;  Surgeon: Irene Limbo, MD;  Location: [place];  Service: Plastics;  Laterality: Right;  . PORTACATH PLACEMENT N/A 11/01/2016   Procedure: INSERTION PORT-A-CATH WITH [place];  Surgeon: Rolm Bookbinder, MD;  Location: [place];  Service: General;  Laterality: N/A;  . PORTACATH PLACEMENT N/A 08/23/2018   Procedure: INSERTION PORT-A-CATH WITH ULTRASOUND;  Surgeon: Rolm Bookbinder, MD;  Location: [place];  Service: General;  Laterality: N/A;  . RADIOACTIVE SEED GUIDED AXILLARY SENTINEL LYMPH NODE Left 03/27/2017   Procedure: LEFT NIPPLE SPARING MASTECTOMY WITH RADIOACTIVE SEED TARGETED LYMPH NODE EXCISION AND LEFT AXILLARY SENTINEL LYMPH NODE BIOPSY;  Surgeon: Rolm Bookbinder, MD;  Location: [place];  Service: General;  Laterality: Left;  REQUESTS RNFA  . RECONSTRUCTION BREAST  IMMEDIATE / DELAYED W/ TISSUE EXPANDER Bilateral 03/27/2017   BILATERAL BREAST RECONSTRUCTION WITH PLACEMENT OF TISSUE EXPANDER AND ALLODERM  . REMOVAL OF BILATERAL TISSUE EXPANDERS WITH PLACEMENT OF BILATERAL BREAST IMPLANTS Bilateral 01/05/2018   Procedure: REMOVAL OF BILATERAL TISSUE EXPANDERS WITH PLACEMENT OF BILATERAL BREAST IMPLANTS;  Surgeon: Irene Limbo, MD;  Location: [place];  Service: Plastics;  Laterality: Bilateral;  . SEPTOPLASTY WITH ETHMOIDECTOMY, AND MAXILLARY ANTROSTOMY  10/29/2010   bilat. max. antrostomy with left max. stripping; left ant. ethmoidectomy; right total ethmoidectomy; sphenoidotomy  . SHOULDER ARTHROSCOPY WITH SUBACROMIAL DECOMPRESSION AND BICEP TENDON REPAIR Right 07/12/2013   Procedure: RIGHT SHOULDER ARTHROSCOPY DEBRIDEMENT EXTENSIVE DECOMPRESSION SUBACROMIAL PARTIAL ACROMIOPLASTY;  Surgeon: Johnny Bridge, MD;  Location: [place];  Service: Orthopedics;  Laterality: Right;  . WRIST ARTHROSCOPY  01/17/2012   Procedure: ARTHROSCOPY WRIST; right wrist Surgeon: Tennis Must, MD;  Location: [place];  Service: Orthopedics;  Laterality: Right;  RIGHT WRIST ARTHROSCOPY WITH TRIANGULAR FIBROCARTILAGE COMPLEX REPAIR AND DEBRIDEMENT     SOCIAL HISTORY: Social History   Socioeconomic History  . Marital status: Married  Spouse name: Not on file  . Number of children: Not on file  . Years of education: Not on file  . Highest education level: Not on file  Occupational History  . Not on file  Tobacco Use  . Smoking status: Never Smoker  . Smokeless tobacco: Never Used  Substance and Sexual Activity  . Alcohol use: Yes    Comment: Drinks very rare  . Drug use: No  . Sexual activity: Yes    Birth control/protection: Surgical    Comment: husband has had a vasectomy  Other Topics Concern  . Not on file  Social History Narrative  . Not on file   Social Determinants of Health   Financial Resource  Strain:   . Difficulty of Paying Living Expenses: Not on file  Food Insecurity:   . Worried About Charity fundraiser in the Last Year: Not on file  . Ran Out of Food in the Last Year: Not on file  Transportation Needs:   . Lack of Transportation (Medical): Not on file  . Lack of Transportation (Non-Medical): Not on file  Physical Activity:   . Days of Exercise per Week: Not on file  . Minutes of Exercise per Session: Not on file  Stress:   . Feeling of Stress : Not on file  Social Connections:   . Frequency of Communication with Friends and Family: Not on file  . Frequency of Social Gatherings with Friends and Family: Not on file  . Attends Religious Services: Not on file  . Active Member of Clubs or Organizations: Not on file  . Attends Archivist Meetings: Not on file  . Marital Status: Not on file  Intimate Partner Violence:   . Fear of Current or Ex-Partner: Not on file  . Emotionally Abused: Not on file  . Physically Abused: Not on file  . Sexually Abused: Not on file    FAMILY HISTORY: Family History  Problem Relation Age of Onset  . Breast cancer Mother 65       triple negative  . Leukemia Father   . Lung cancer Father   . Heart attack Maternal Uncle   . Prostate cancer Paternal Uncle   . COPD Paternal Grandmother   . Heart disease Paternal Grandfather   . Prostate cancer Paternal Uncle   . Leukemia Cousin     Review of Systems  Constitutional: Negative for appetite change, chills, fatigue, fever and unexpected weight change.  HENT:   Negative for hearing loss, lump/mass and trouble swallowing.   Eyes: Negative for eye problems and icterus.  Respiratory: Negative for chest tightness and shortness of breath.   Cardiovascular: Negative for chest pain, leg swelling and palpitations.  Gastrointestinal: Negative for abdominal distention, constipation, diarrhea, nausea and vomiting.  Endocrine: Negative for hot flashes.  Genitourinary: Negative for  difficulty urinating.   Musculoskeletal: Positive for back pain (stable). Negative for arthralgias.  Skin: Negative for itching and rash.  Neurological: Negative for dizziness and headaches.  Hematological: Negative for adenopathy. Does not bruise/bleed easily.  Psychiatric/Behavioral: Negative for depression. The patient is not nervous/anxious.       PHYSICAL EXAMINATION  ECOG PERFORMANCE STATUS: 1 - Symptomatic but completely ambulatory  Vitals:   02/15/19 0902  BP: 100/84  Pulse: (!) 109  Resp: 18  Temp: 99.1 F (37.3 C)  SpO2: 98%    Physical Exam Constitutional:      General: She is not in acute distress.    Appearance: Normal appearance.  HENT:     Head: Normocephalic and atraumatic.     Mouth/Throat:     Comments: Mask in place  Eyes:     General: No scleral icterus. Cardiovascular:     Rate and Rhythm: Normal rate and regular rhythm.     Pulses: Normal pulses.     Heart sounds: Normal heart sounds.  Pulmonary:     Effort: Pulmonary effort is normal.     Breath sounds: Normal breath sounds.  Abdominal:     General: Abdomen is flat. There is no distension.     Palpations: Abdomen is soft.     Tenderness: There is no abdominal tenderness.  Musculoskeletal:        General: No swelling.     Cervical back: Neck supple.  Lymphadenopathy:     Cervical: No cervical adenopathy.  Skin:    General: Skin is warm and dry.     Capillary Refill: Capillary refill takes less than 2 seconds.     Findings: No rash.  Neurological:     General: No focal deficit present.     Mental Status: She is alert and oriented to person, place, and time.  Psychiatric:        Mood and Affect: Mood normal.        Behavior: Behavior normal.     LABORATORY DATA:  CBC    Component Value Date/Time   WBC 3.3 (L) 02/15/2019 0841   WBC 11.0 (H) 01/04/2019 1649   RBC 2.99 (L) 02/15/2019 0841   HGB 10.0 (L) 02/15/2019 0841   HGB 11.1 (L) 01/27/2017 0802   HCT 29.6 (L) 02/15/2019  0841   HCT 33.4 (L) 01/27/2017 0802   PLT 90 (L) 02/15/2019 0841   PLT 170 01/27/2017 0802   MCV 99.0 02/15/2019 0841   MCV 99.0 01/27/2017 0802   MCH 33.4 02/15/2019 0841   MCHC 33.8 02/15/2019 0841   RDW 14.9 02/15/2019 0841   RDW 18.2 (H) 01/27/2017 0802   LYMPHSABS 1.4 02/15/2019 0841   LYMPHSABS 1.7 01/27/2017 0802   MONOABS 0.5 02/15/2019 0841   MONOABS 0.5 01/27/2017 0802   EOSABS 0.1 02/15/2019 0841   EOSABS 0.0 01/27/2017 0802   BASOSABS 0.0 02/15/2019 0841   BASOSABS 0.0 01/27/2017 0802    CMP     Component Value Date/Time   NA 139 02/15/2019 0841   NA 138 01/27/2017 0802   K 3.6 02/15/2019 0841   K 4.1 01/27/2017 0802   CL 105 02/15/2019 0841   CO2 24 02/15/2019 0841   CO2 26 01/27/2017 0802   GLUCOSE 114 (H) 02/15/2019 0841   GLUCOSE 90 01/27/2017 0802   BUN 11 02/15/2019 0841   BUN 14.0 01/27/2017 0802   CREATININE 0.80 02/15/2019 0841   CREATININE 0.7 01/27/2017 0802   CALCIUM 8.5 (L) 02/15/2019 0841   CALCIUM 8.6 01/27/2017 0802   PROT 7.1 02/15/2019 0841   PROT 6.5 01/27/2017 0802   ALBUMIN 3.2 (L) 02/15/2019 0841   ALBUMIN 3.5 01/27/2017 0802   AST 23 02/15/2019 0841   AST 22 01/27/2017 0802   ALT 14 02/15/2019 0841   ALT 23 01/27/2017 0802   ALKPHOS 136 (H) 02/15/2019 0841   ALKPHOS 85 01/27/2017 0802   BILITOT 0.3 02/15/2019 0841   BILITOT 0.23 01/27/2017 0802   GFRNONAA >60 02/15/2019 0841   GFRAA >60 02/15/2019 0841       ASSESSMENT and PLAN:   Malignant neoplasm of lower-outer quadrant of left breast of female, estrogen receptor negative (  [place]) Mammogram and ultrasound of the left breast revealed 1.7 cm mass at 4:00 position, 6:30 position 5 x 4 x 4 mm mass, 6:00 position 5 cm nipple 7 x 6 x 11 mm, left axillary lymph node with thickened cortex, T1c N1 stage II a AJCC 8  10/24/2016: Left breast biopsy 6:30 position 3 cm from nipple: IDC grade 2, DCIS, ER 0%, PR 0%, Ki-67 15% HER-2 positive ratio 2.1; 4:00 position 3 cm from nipple:  IDC grade 2, DCIS, ER 0%, PR 0%, Ki-67 35%, HER-2 positive ratio 2.02 Lymph node biopsy positive  Treatment Summary: 1. Neoadjuvant chemotherapy with TCHPcompleted 02/17/2017 this would be followed by Herceptinand Perjetamaintenance for 1 yearcompleted September 2019 2.Bilateral mastectomies 03/28/2016:Bilateral mastectomies: Left mastectomy: IDC grade 2 0.9 cm, nodes negative, right mastectomy benign, ER 0%, PR 0%, HER-2 positive ratio 2.6 3.Adjuvant radiation4/08/2017 to 06/09/2017 4.Neratinib started 10/12/2017 discontinued due to diarrhea 5. Elbow fracture: Due to metastatic disease, palliative radiation therapy 6. Carboplatin atezolizumab at [facility] on a clinical trial [facility] 043 stopped for progression 12/04/2018 ---------------------------------------------------------------------------------------------------------------- Lung nodule biopsy: Metastatic breast cancer triple negative  CT CAP 12/03/2018: Progression of lung nodules and bone metastases done at [facility] CTA 01/2019: improvement in lung nodules Current treatment:  Eribulin/Herceptin/Perjeta.  She met with myself and Dr. Lindi Adie today.  After review of her lab work, she will receive Eribulin given at 0.41m/m2.  This will be administered every 2 weeks, with Herceptin and Perjeta adjusted to be administered every 4 weeks.  She will receive Neulasta or biosimilar the day after due to her h/o febrile neutropenia after receiving Eribulin.    We will recheck scans after 2 more cycles of treatment.  CTA in 01/2019 show improvement and she has no signs of clinical progression.    She is due for echocardiogram in February.  My nurse is calling to schedule this.    AMeilahwill return tomorrow for an injection and in 2 weeks for labs, f/u with Dr. GLindi Adie and Eribulin/Herceptin/Perjeta.    She was recommended to continue with the appropriate pandemic precautions.      All questions were answered. The patient  knows to call the clinic with any problems, questions or concerns. We can certainly see the patient much sooner if necessary.  Total time spent (30 minutes).  This note was electronically signed. LScot Dock NP 02/15/2019   Attending Note  I personally saw and examined Laura Anthony The plan of care was discussed with her. I agree with the assessment and plan as documented above. Metastatic breast cancer: resume Halaven at 0.7 mg/m2 Pain: better controlled GCSF day after chemo. We will see if Onpro can be approved.  Signed VHarriette Ohara MD

## 2019-02-15 NOTE — Assessment & Plan Note (Addendum)
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 Neoadjuvant chemotherapy with TCHPcompleted 02/17/2017 this would be followed by Herceptinand Perjetamaintenance for 1 yearcompleted September 2019 2.Bilateral mastectomies 03/28/2016:Bilateral mastectomies: Left mastectomy: IDC grade 2 0.9 cm, nodes negative, right mastectomy benign, ER 0%, PR 0%, HER-2 positive ratio 2.6 3.Adjuvant radiation4/08/2017 to 06/09/2017 4.Neratinib started 10/12/2017 discontinued due to diarrhea 5. Elbow fracture: Due to metastatic disease, palliative radiation therapy 6. Carboplatin atezolizumab at [facility] on a clinical trial [facility] 043 stopped for progression 12/04/2018 ---------------------------------------------------------------------------------------------------------------- Lung nodule biopsy: Metastatic breast cancer triple negative  CT CAP 12/03/2018: Progression of lung nodules and bone metastases done at [facility] CTA 01/2019: improvement in lung nodules Current treatment:  Eribulin/Herceptin/Perjeta.  She met with myself and Dr. Lindi Adie today.  After review of her lab work, she will receive Eribulin given at 0.7m/m2.  This will be administered every 2 weeks, with Herceptin and Perjeta adjusted to be administered every 4 weeks.  She will receive Neulasta or biosimilar the day after due to her h/o febrile neutropenia after receiving Eribulin.    We will recheck scans after 2 more cycles of treatment.  CTA in 01/2019 show  improvement and she has no signs of clinical progression.    She is due for echocardiogram in February.  My nurse is calling to schedule this.    ALakyrawill return tomorrow for an injection and in 2 weeks for labs, f/u with Dr. GLindi Adie and Eribulin/Herceptin/Perjeta.    She was recommended to continue with the appropriate pandemic precautions.

## 2019-02-16 ENCOUNTER — Inpatient Hospital Stay: Payer: 59

## 2019-02-16 VITALS — BP 132/85 | HR 101 | Temp 98.7°F | Resp 18

## 2019-02-16 DIAGNOSIS — Z7189 Other specified counseling: Secondary | ICD-10-CM

## 2019-02-16 DIAGNOSIS — C50512 Malignant neoplasm of lower-outer quadrant of left female breast: Secondary | ICD-10-CM

## 2019-02-16 DIAGNOSIS — C7951 Secondary malignant neoplasm of bone: Secondary | ICD-10-CM

## 2019-02-16 DIAGNOSIS — Z171 Estrogen receptor negative status [ER-]: Secondary | ICD-10-CM

## 2019-02-16 MED ORDER — PEGFILGRASTIM-BMEZ 6 MG/0.6ML ~~LOC~~ SOSY
6.0000 mg | PREFILLED_SYRINGE | Freq: Once | SUBCUTANEOUS | Status: AC
  Administered 2019-02-16: 6 mg via SUBCUTANEOUS

## 2019-02-16 NOTE — Patient Instructions (Signed)

## 2019-02-18 ENCOUNTER — Telehealth: Payer: Self-pay | Admitting: Hematology and Oncology

## 2019-02-18 NOTE — Telephone Encounter (Signed)
Scheduled per 1/15 sch msg. Called and left msg. Mailing printout  

## 2019-02-20 ENCOUNTER — Other Ambulatory Visit: Payer: Self-pay | Admitting: Hematology and Oncology

## 2019-02-25 ENCOUNTER — Other Ambulatory Visit: Payer: Self-pay | Admitting: Hematology

## 2019-02-25 ENCOUNTER — Telehealth: Payer: Self-pay | Admitting: Hematology

## 2019-02-25 MED ORDER — AMOXICILLIN-POT CLAVULANATE 875-125 MG PO TABS: 1.0000 | ORAL_TABLET | Freq: Two times a day (BID) | ORAL | 0 refills | Status: DC

## 2019-02-25 NOTE — Telephone Encounter (Signed)
Pt called tonight for fever Tmax 101. It started around 6pm, she has mild throat discomfort, and mild dry cough, no chills, no dyspnea or chest discomfort. Last BM 2-3 days ago. She otherwise feels well.   She is on chemo Halaven, last dose 1/15, and got GCSF after chemo.    I recommended ED evaluation due to her neutropenia and history of neutropenic fever. However, pt really does not want to go, and her neutropenic fever was previously managed as outpt.   I will call in levaquin tonight, and she will start tonight. Will come in tomorrow to see Dr. Lindi Adie or [facility] for lab and possible iv antibiotics.   She knows to go to ED if she develop new symptoms or not feeling well.   Truitt Merle  02/25/2019

## 2019-02-25 NOTE — Telephone Encounter (Signed)
Due to interaction of Levaquin with methadone (prolong QT), I called in Augmentin 875mg  bid x7 instead, pt is aware.  Truitt Merle  02/25/2019

## 2019-02-26 ENCOUNTER — Other Ambulatory Visit: Payer: Self-pay | Admitting: Emergency Medicine

## 2019-02-26 ENCOUNTER — Other Ambulatory Visit: Payer: Self-pay

## 2019-02-26 ENCOUNTER — Telehealth: Payer: Self-pay

## 2019-02-26 ENCOUNTER — Inpatient Hospital Stay (HOSPITAL_BASED_OUTPATIENT_CLINIC_OR_DEPARTMENT_OTHER): Payer: 59 | Admitting: Medical

## 2019-02-26 VITALS — BP 114/75 | HR 101 | Temp 99.5°F | Resp 18 | Ht 67.0 in | Wt 169.2 lb

## 2019-02-26 DIAGNOSIS — C50512 Malignant neoplasm of lower-outer quadrant of left female breast: Secondary | ICD-10-CM

## 2019-02-26 DIAGNOSIS — R509 Fever, unspecified: Secondary | ICD-10-CM

## 2019-02-26 DIAGNOSIS — C7951 Secondary malignant neoplasm of bone: Secondary | ICD-10-CM

## 2019-02-26 DIAGNOSIS — G893 Neoplasm related pain (acute) (chronic): Secondary | ICD-10-CM

## 2019-02-26 DIAGNOSIS — Z171 Estrogen receptor negative status [ER-]: Secondary | ICD-10-CM

## 2019-02-26 DIAGNOSIS — R300 Dysuria: Secondary | ICD-10-CM

## 2019-02-26 DIAGNOSIS — Z95828 Presence of other vascular implants and grafts: Secondary | ICD-10-CM | POA: Diagnosis not present

## 2019-02-26 LAB — URINALYSIS, COMPLETE (UACMP) WITH MICROSCOPIC
Bacteria, UA: NONE SEEN
Bilirubin Urine: NEGATIVE
Glucose, UA: NEGATIVE mg/dL
Hgb urine dipstick: NEGATIVE
Ketones, ur: NEGATIVE mg/dL
Nitrite: NEGATIVE
Protein, ur: NEGATIVE mg/dL
Specific Gravity, Urine: 1.018 (ref 1.005–1.030)
pH: 7 (ref 5.0–8.0)

## 2019-02-26 LAB — CMP (CANCER CENTER ONLY)
ALT: 12 U/L (ref 0–44)
AST: 23 U/L (ref 15–41)
Albumin: 3.1 g/dL — ABNORMAL LOW (ref 3.5–5.0)
Alkaline Phosphatase: 151 U/L — ABNORMAL HIGH (ref 38–126)
Anion gap: 9 (ref 5–15)
BUN: 6 mg/dL (ref 6–20)
CO2: 26 mmol/L (ref 22–32)
Calcium: 8.5 mg/dL — ABNORMAL LOW (ref 8.9–10.3)
Chloride: 103 mmol/L (ref 98–111)
Creatinine: 0.75 mg/dL (ref 0.44–1.00)
GFR, Est AFR Am: >60 mL/min (ref >60)
GFR, Estimated: >60 mL/min (ref >60)
Glucose, Bld: 100 mg/dL — ABNORMAL HIGH (ref 70–99)
Potassium: 4 mmol/L (ref 3.5–5.1)
Sodium: 138 mmol/L (ref 135–145)
Total Bilirubin: 0.2 mg/dL — ABNORMAL LOW (ref 0.3–1.2)
Total Protein: 7 g/dL (ref 6.5–8.1)

## 2019-02-26 LAB — CBC WITH DIFFERENTIAL (CANCER CENTER ONLY)
Abs Immature Granulocytes: 0.08 10*3/uL — ABNORMAL HIGH (ref 0.00–0.07)
Basophils Absolute: 0 10*3/uL (ref 0.0–0.1)
Basophils Relative: 0 %
Eosinophils Absolute: 0 10*3/uL (ref 0.0–0.5)
Eosinophils Relative: 0 %
HCT: 28.3 % — ABNORMAL LOW (ref 36.0–46.0)
Hemoglobin: 9.3 g/dL — ABNORMAL LOW (ref 12.0–15.0)
Immature Granulocytes: 1 %
Lymphocytes Relative: 14 %
Lymphs Abs: 1.2 10*3/uL (ref 0.7–4.0)
MCH: 32.3 pg (ref 26.0–34.0)
MCHC: 32.9 g/dL (ref 30.0–36.0)
MCV: 98.3 fL (ref 80.0–100.0)
Monocytes Absolute: 0.7 10*3/uL (ref 0.1–1.0)
Monocytes Relative: 8 %
Neutro Abs: 6.3 10*3/uL (ref 1.7–7.7)
Neutrophils Relative %: 77 %
Platelet Count: 86 10*3/uL — ABNORMAL LOW (ref 150–400)
RBC: 2.88 MIL/uL — ABNORMAL LOW (ref 3.87–5.11)
RDW: 15 % (ref 11.5–15.5)
WBC Count: 8.3 10*3/uL (ref 4.0–10.5)
nRBC: 0.2 % (ref 0.0–0.2)

## 2019-02-26 LAB — SAMPLE TO BLOOD BANK

## 2019-02-26 MED ORDER — SODIUM CHLORIDE 0.9% FLUSH
10.0000 mL | INTRAVENOUS | Status: DC | PRN
  Administered 2019-02-26: 10 mL via INTRAVENOUS
  Filled 2019-02-26: qty 10

## 2019-02-26 MED ORDER — HEPARIN SOD (PORK) LOCK FLUSH 100 UNIT/ML IV SOLN
500.0000 [IU] | Freq: Once | INTRAVENOUS | Status: AC | PRN
  Administered 2019-02-26: 500 [IU] via INTRAVENOUS
  Filled 2019-02-26: qty 5

## 2019-02-26 NOTE — Telephone Encounter (Signed)
RN spoke with patient to follow up with fever report to on-call provider on 1/25.  Pt reports "fever broke last night so I have not started my antibiotic."   Pt reports temperature is around 98.6 - continues with raspy voice and sore throat.    Pt is scheduled for labs and [facility] per on-call recommendations.  Pt verbalized understanding.

## 2019-02-26 NOTE — Progress Notes (Signed)
[facility] Progress Note   Laura Anthony [id number] 02-Jan-1976 44 y.o.  Laura Anthony is managed by Dr. Nicholas Lose  Actively treated with chemotherapy/immunotherapy/hormonal therapy: yes  Current therapy: Halaven with Herceptin and Perjeta  Next scheduled appointment with provider: 03/01/2019.  Assessment: Plan:    Dysuria - Plan: Urinalysis, Complete w Microscopic, Urine Culture  Port-A-Cath in place - Plan: heparin lock flush 100 unit/mL, sodium chloride flush (NS) 0.9 % injection 10 mL   Fever. At home temp readings ranging from 100-101 degrees. Today in office, 99.5 degrees F. CBC with unremarkable WBC, and neutrophil count. Patient is currently receiving Neulasta shots, and is 10 days out from her last injection. Discussed that her fever and scratchy throat could be secondary to a viral infection, and to continue to monitor her symptoms.   Urinary discomfort. As noted above, patient has been having discomfort with no dysuria, frequency or urgency. UA was completed, with no sign of infection. With her history of T spine mets, and concurrent left sided chest wall pain, I suspect her CVAT is secondary to her pain from her mets or most recent Neulasta.   Back pain/body pain. Currently using methadone and percocet for break through pain. Patient has had history of severe abdominal pain in the past that was treated in the ER, of unknown etiology. No nausea or vomiting, blood in stools, or signs of an acute abdomen on exam. Given significant T spine/left flank pain and left sided abdominal pain today, encouraged patient to take 5 mg methadone three times daily.  History of dyspnea on exertion, cough, shortness of breath, and acute left lower extremity DVT: The patient completed CTA on 01/30/19, which was negative for PE or worsening lung metastases. She continues on Eliquis.  Metastatic breast cancer with pulmonary and bony metastasis: She will  return on 02/28/2018 for treatment with Perjeta and trastuzumab.   Please see After Visit Summary for patient specific instructions.  Future Appointments  Date Time Provider [place]  03/01/2019  8:45 AM [facility] LAB/FLUSH [facility] None  03/01/2019  9:00 AM [facility] [place] FLUSH [facility] None  03/01/2019  9:30 AM Nicholas Lose, MD [facility] None  03/01/2019 10:15 AM [facility] INFUSION [facility] None  03/02/2019 12:45 PM [facility] [place] FLUSH [facility] None  03/04/2019 11:00 AM [facility]- ECHO 1-RUTH [facility]ARDUS [facility]  03/15/2019  9:00 AM [facility] LAB 3 [facility] None  03/15/2019  9:30 AM [facility] [place] FLUSH [facility] None  03/15/2019 10:15 AM [facility] INFUSION [facility] None  03/29/2019  9:00 AM [facility] LAB 1 [facility] None  03/29/2019  9:15 AM [facility] [place] FLUSH [facility] None  03/29/2019  9:45 AM Nicholas Lose, MD [facility] None  03/29/2019 10:45 AM [facility] INFUSION [facility] None  06/21/2019 10:45 AM Nicholas Lose, MD [facility] None    Orders Placed This Encounter  Procedures   Urine Culture   Urinalysis, Complete w Microscopic       Subjective:   Patient ID:  Laura Anthony is a 44 y.o. (DOB 1975-11-10) female.  Chief Complaint:  Chief Complaint  Patient presents with   Fever    Fever  Associated symptoms include diarrhea, a sore throat and urinary pain. Pertinent negatives include no abdominal pain, chest pain, congestion, coughing, ear pain, headaches, nausea, vomiting or wheezing.   Laura Anthony  Is a 44 y.o. female with a diagnosis of a metastatic breast cancer with bone and pulmonary metastasis.  She is managed by Dr. Lindi Adie and has been treated with Halaven, Herceptin and  Perjeta most recently.  She presents to the clinic today with fevers at home.  Last night at 7 PM she felt feverish and checked her temperature, which was 100.1 degrees F. She took ES Tylenol x 2 and around 9 PM had a reading of 101 degrees F. She called the  physician on-call and was told to go to the ED. It was decided that she would go in the AM if she was still febrile. Rx for Augmentin called in by Dr. Burr Medico, which she has not started taking. Sick contacts include her daughter who had been experiencing a sore throat, and headache over the weekend. Her daughter's rapid strep was negative, and her COVID test has not come back yet. She reports associated body pain as well, worse over the past 4 days from her baseline, but no chills. Endorses a scratchy throat but no dysphagia. Of note, she has been experiencing urinary discomfort but no dysuria, frequency, urgency or hematuria. She does have baseline flank pain on her left side secondary to bone mets. Bowel movements have been unchanged, other than one episode of diarrhea 8 days ago. Denies cough, worsening shortness of breath, nasal congestion, abdominal pain, vomiting.  Medications: I have reviewed the patient's current medications.  Allergies:  Allergies  Allergen Reactions   Sumatriptan Other (See Comments)    Numbness to face    Statins Other (See Comments)    Leg pain   Tape Rash and Other (See Comments)    Rash from dressing over port-a-cath     Past Medical History:  Diagnosis Date   ADD (attention deficit disorder)    Anemia    Anxiety    Breast cancer, left breast ([place])    S/P mastectomy 03/27/2017   DVT (deep venous thrombosis) ([place]) 2017   calf left - probably due to [facility] pills-took eliquis x3 mos, nonthing now   High cholesterol    Impingement syndrome of right shoulder 07/2013   Migraine    "usually 1/month" (03/28/2017)   PONV (postoperative nausea and vomiting)    Right bicipital tenosynovitis 07/2013   Rotator cuff impingement syndrome of right shoulder 07/12/2013   Seizures ([place])    x 1 as a child - was never on anticonvulsants (03/28/2017)    Past Surgical History:  Procedure Laterality Date   ADENOIDECTOMY  1981   ANKLE ARTHROSCOPY Right    BREAST  BIOPSY Left 10/2016   KNEE ARTHROSCOPY Right    KNEE ARTHROSCOPY W/ ACL RECONSTRUCTION Left    LIPOSUCTION WITH LIPOFILLING Bilateral 01/05/2018   Procedure: LIPOFILLING FROM ABDOMEN TO BILATERAL CHEST;  Surgeon: Irene Limbo, MD;  Location: [place];  Service: Plastics;  Laterality: Bilateral;   MASTECTOMY Left 03/27/2017   NIPPLE SPARING MASTECTOMY WITH RADIOACTIVE SEED TARGETED LYMPH NODE EXCISION AND LEFT AXILLARY SENTINEL LYMPH NODE BIOPSY   MASTECTOMY Right 03/27/2017   RIGHT PROPHYLACTIC NIPPLE SPARING MASTECTOMY   NIPPLE SPARING MASTECTOMY Right 03/27/2017   Procedure: RIGHT PROPHYLACTIC NIPPLE SPARING MASTECTOMY;  Surgeon: Rolm Bookbinder, MD;  Location: [place];  Service: General;  Laterality: Right;   PORT-A-CATH REMOVAL Right 01/05/2018   Procedure: REMOVAL RIGHT CHEST PORT;  Surgeon: Irene Limbo, MD;  Location: [place];  Service: Plastics;  Laterality: Right;   PORTACATH PLACEMENT N/A 11/01/2016   Procedure: INSERTION PORT-A-CATH WITH [place];  Surgeon: Rolm Bookbinder, MD;  Location: [place];  Service: General;  Laterality: N/A;   PORTACATH PLACEMENT N/A 08/23/2018   Procedure: INSERTION PORT-A-CATH WITH ULTRASOUND;  Surgeon: Rolm Bookbinder, MD;  Location: [place];  Service: General;  Laterality: N/A;   RADIOACTIVE SEED GUIDED AXILLARY SENTINEL LYMPH NODE Left 03/27/2017   Procedure: LEFT NIPPLE SPARING MASTECTOMY WITH RADIOACTIVE SEED TARGETED LYMPH NODE EXCISION AND LEFT AXILLARY SENTINEL LYMPH NODE BIOPSY;  Surgeon: Rolm Bookbinder, MD;  Location: [place];  Service: General;  Laterality: Left;  REQUESTS RNFA   RECONSTRUCTION BREAST IMMEDIATE / DELAYED W/ TISSUE EXPANDER Bilateral 03/27/2017   BILATERAL BREAST RECONSTRUCTION WITH PLACEMENT OF TISSUE EXPANDER AND ALLODERM   REMOVAL OF BILATERAL TISSUE EXPANDERS WITH PLACEMENT OF BILATERAL BREAST IMPLANTS Bilateral  01/05/2018   Procedure: REMOVAL OF BILATERAL TISSUE EXPANDERS WITH PLACEMENT OF BILATERAL BREAST IMPLANTS;  Surgeon: Irene Limbo, MD;  Location: [place];  Service: Plastics;  Laterality: Bilateral;   SEPTOPLASTY WITH ETHMOIDECTOMY, AND MAXILLARY ANTROSTOMY  10/29/2010   bilat. max. antrostomy with left max. stripping; left ant. ethmoidectomy; right total ethmoidectomy; sphenoidotomy   SHOULDER ARTHROSCOPY WITH SUBACROMIAL DECOMPRESSION AND BICEP TENDON REPAIR Right 07/12/2013   Procedure: RIGHT SHOULDER ARTHROSCOPY DEBRIDEMENT EXTENSIVE DECOMPRESSION SUBACROMIAL PARTIAL ACROMIOPLASTY;  Surgeon: Johnny Bridge, MD;  Location: [place];  Service: Orthopedics;  Laterality: Right;   WRIST ARTHROSCOPY  01/17/2012   Procedure: ARTHROSCOPY WRIST; right wrist Surgeon: Tennis Must, MD;  Location: [place];  Service: Orthopedics;  Laterality: Right;  RIGHT WRIST ARTHROSCOPY WITH TRIANGULAR FIBROCARTILAGE COMPLEX REPAIR AND DEBRIDEMENT     Family History  Problem Relation Age of Onset   Breast cancer Mother 19       triple negative   Leukemia Father    Lung cancer Father    Heart attack Maternal Uncle    Prostate cancer Paternal Uncle    COPD Paternal Grandmother    Heart disease Paternal Grandfather    Prostate cancer Paternal Uncle    Leukemia Cousin     Social History   Socioeconomic History   Marital status: Married    Spouse name: Not on file   Number of children: Not on file   Years of education: Not on file   Highest education level: Not on file  Occupational History   Not on file  Tobacco Use   Smoking status: Never Smoker   Smokeless tobacco: Never Used  Substance and Sexual Activity   Alcohol use: Yes    Comment: Drinks very rare   Drug use: No   Sexual activity: Yes    Birth control/protection: Surgical    Comment: husband has had a vasectomy  Other Topics Concern   Not on file  Social  History Narrative   Not on file   Social Determinants of Health   Financial Resource Strain:    Difficulty of Paying Living Expenses: Not on file  Food Insecurity:    Worried About Charity fundraiser in the Last Year: Not on file   [employer] of Food in the Last Year: Not on file  Transportation Needs:    Lack of Transportation (Medical): Not on file   Lack of Transportation (Non-Medical): Not on file  Physical Activity:    Days of Exercise per Week: Not on file   Minutes of Exercise per Session: Not on file  Stress:    Feeling of Stress : Not on file  Social Connections:    Frequency of Communication with Friends and Family: Not on file   Frequency of Social Gatherings with Friends and Family: Not on file   Attends Religious  Services: Not on file   Active Member of Clubs or Organizations: Not on file   Attends Archivist Meetings: Not on file   Marital Status: Not on file  Intimate Partner Violence:    Fear of Current or Ex-Partner: Not on file   Emotionally Abused: Not on file   Physically Abused: Not on file   Sexually Abused: Not on file    Past Medical History, Surgical history, Social history, and Family history were reviewed and updated as appropriate.   Please see review of systems for further details on the patient's review from today.   Review of Systems:  Review of Systems  Constitutional: Positive for fever. Negative for appetite change, chills and diaphoresis.  HENT: Positive for sore throat. Negative for congestion, ear discharge, ear pain, rhinorrhea, sinus pressure, sinus pain and trouble swallowing.   Respiratory: Negative for cough, choking, chest tightness, shortness of breath, wheezing and stridor.   Cardiovascular: Negative for chest pain and palpitations.  Gastrointestinal: Positive for constipation and diarrhea. Negative for abdominal distention, abdominal pain, blood in stool, nausea and vomiting.  Genitourinary: Positive  for dysuria. Negative for frequency, hematuria and urgency.  Musculoskeletal: Positive for back pain and myalgias.  Neurological: Negative for headaches.    Objective:   Physical Exam:  BP 114/75 (BP Location: Right Arm, Patient Position: Sitting)    Pulse (!) 101    Temp 99.5 F (37.5 C) (Oral)    Resp 18    Ht 5\' 7"  (1.702 m)    Wt 76.7 kg    SpO2 97%    BMI 26.50 kg/m  ECOG: 1  Oxygen Saturation:  Room air at rest:    97 %  Pulse:  93 BPM Room air with ambulation:   99 %  Pulse: 112 BPM  Physical Exam Constitutional:      General: She is not in acute distress.    Appearance: She is not ill-appearing, toxic-appearing or diaphoretic.  HENT:     Head: Normocephalic and atraumatic.     Right Ear: Tympanic membrane and ear canal normal.     Left Ear: Tympanic membrane and ear canal normal.     Mouth/Throat:     Mouth: Mucous membranes are moist.     Pharynx: No oropharyngeal exudate or posterior oropharyngeal erythema.  Cardiovascular:     Rate and Rhythm: Normal rate and regular rhythm.     Pulses: Normal pulses.     Heart sounds: Normal heart sounds. No murmur. No friction rub. No gallop.   Pulmonary:     Effort: Pulmonary effort is normal. No respiratory distress.     Breath sounds: Rales present. No wheezing.  Abdominal:     General: There is no distension.     Palpations: There is no mass.     Tenderness: There is abdominal tenderness. There is left CVA tenderness. There is no right CVA tenderness, guarding or rebound.  Musculoskeletal:     Cervical back: No tenderness.     Right lower leg: No edema.     Left lower leg: No edema.  Lymphadenopathy:     Cervical: No cervical adenopathy.  Skin:    General: Skin is warm and dry.     Findings: No erythema or rash.  Neurological:     General: No focal deficit present.     Mental Status: She is alert.     Comments: The patient is ambulating with a rolling walker.     Lab Review:  Component Value Date/Time    NA 138 02/26/2019 1145   NA 138 01/27/2017 0802   K 4.0 02/26/2019 1145   K 4.1 01/27/2017 0802   CL 103 02/26/2019 1145   CO2 26 02/26/2019 1145   CO2 26 01/27/2017 0802   GLUCOSE 100 (H) 02/26/2019 1145   GLUCOSE 90 01/27/2017 0802   BUN 6 02/26/2019 1145   BUN 14.0 01/27/2017 0802   CREATININE 0.75 02/26/2019 1145   CREATININE 0.7 01/27/2017 0802   CALCIUM 8.5 (L) 02/26/2019 1145   CALCIUM 8.6 01/27/2017 0802   PROT 7.0 02/26/2019 1145   PROT 6.5 01/27/2017 0802   ALBUMIN 3.1 (L) 02/26/2019 1145   ALBUMIN 3.5 01/27/2017 0802   AST 23 02/26/2019 1145   AST 22 01/27/2017 0802   ALT 12 02/26/2019 1145   ALT 23 01/27/2017 0802   ALKPHOS 151 (H) 02/26/2019 1145   ALKPHOS 85 01/27/2017 0802   BILITOT 0.2 (L) 02/26/2019 1145   BILITOT 0.23 01/27/2017 0802   GFRNONAA >60 02/26/2019 1145   GFRAA >60 02/26/2019 1145       Component Value Date/Time   WBC 8.3 02/26/2019 1145   WBC 11.0 (H) 01/04/2019 1649   RBC 2.88 (L) 02/26/2019 1145   HGB 9.3 (L) 02/26/2019 1145   HGB 11.1 (L) 01/27/2017 0802   HCT 28.3 (L) 02/26/2019 1145   HCT 33.4 (L) 01/27/2017 0802   PLT 86 (L) 02/26/2019 1145   PLT 170 01/27/2017 0802   MCV 98.3 02/26/2019 1145   MCV 99.0 01/27/2017 0802   MCH 32.3 02/26/2019 1145   MCHC 32.9 02/26/2019 1145   RDW 15.0 02/26/2019 1145   RDW 18.2 (H) 01/27/2017 0802   LYMPHSABS 1.2 02/26/2019 1145   LYMPHSABS 1.7 01/27/2017 0802   MONOABS 0.7 02/26/2019 1145   MONOABS 0.5 01/27/2017 0802   EOSABS 0.0 02/26/2019 1145   EOSABS 0.0 01/27/2017 0802   BASOSABS 0.0 02/26/2019 1145   BASOSABS 0.0 01/27/2017 0802   -------------------------------  Imaging from last 24 hours (if applicable):  Radiology interpretation: CT ANGIO CHEST PE W OR WO CONTRAST  Result Date: 01/30/2019 CLINICAL DATA:  History of metastatic lung carcinoma with known DVT and progressive shortness of breath, initial encounter EXAM: CT ANGIOGRAPHY CHEST WITH CONTRAST TECHNIQUE: Multidetector  CT imaging of the chest was performed using the standard protocol during bolus administration of intravenous contrast. Multiplanar CT image reconstructions and MIPs were obtained to evaluate the vascular anatomy. CONTRAST:  113mL OMNIPAQUE IOHEXOL 350 MG/ML SOLN COMPARISON:  12/09/2018 FINDINGS: Cardiovascular: Thoracic aorta demonstrates no aneurysmal dilatation. No cardiac enlargement is seen. No pericardial effusion is noted. The pulmonary artery shows a normal branching pattern. No focal filling defect to suggest pulmonary embolism is seen. No significant coronary calcifications are noted. Mediastinum/Nodes: Right chest wall port is seen. The thoracic inlet is within normal limits. No sizable hilar or mediastinal adenopathy is noted. The esophagus as visualized is within normal limits. Lungs/Pleura: The lungs are well aerated bilaterally. Previously seen lesions are again identified in both lungs. Previously seen right middle lobe lesion measuring approximately 2 cm in dimension now measures approximately 1.4 cm in dimension best seen on image number 90 of series 11. Stable lateral left upper lobe nodule is seen on image number 63 of series 11. Previously seen upper lobe lesion along the major fissure on the left has decreased in size now measuring 10 mm decreased from prior examination at which time it measured 13 mm. The more peripheral lesion in the  left upper lobe previously measured 12.3 mm in dimension. It now is also decreased in size however a bilobed fluid attenuation lesion within the fissure is noted likely related to loculated effusion. Scattered bibasilar atelectatic changes are noted left slightly greater than the right. No sizable effusion is noted inferiorly. Upper Abdomen: Visualized upper abdomen shows no acute abnormality. Stable left adrenal nodule is seen. Musculoskeletal: Bilateral breast implants are noted. Old rib fractures with callus formation are noted on the right involving the right  eighth rib posterolaterally. No compression deformity is seen. Review of the MIP images confirms the above findings. IMPRESSION: No evidence of pulmonary embolism. Changes consistent with the known history of metastatic lung carcinoma although the lesions appear smaller when compared with the most recent exam. Increase in bibasilar atelectasis is noted. Small loculated area of pleural fluid within the fissure on the left best seen on image number 92 of series 11. Electronically Signed   By: Inez Catalina M.D.   On: 01/30/2019 13:54

## 2019-02-26 NOTE — Patient Instructions (Signed)

## 2019-02-26 NOTE — Progress Notes (Signed)
These results were called to [facility] and were reviewed with her . Her questions were answered. She expressed understanding.

## 2019-02-27 ENCOUNTER — Other Ambulatory Visit: Payer: Self-pay | Admitting: Radiation Oncology

## 2019-02-27 LAB — URINE CULTURE: Culture: NO GROWTH

## 2019-02-28 NOTE — Progress Notes (Signed)
 Patient Care Team: Badger, [name], MD as PCP - General (Family Medicine) Stuart, [name], RN as Oncology Nurse Navigator Martini, [name], RN as Oncology Nurse Navigator Serpe, [name], NP as Nurse Practitioner (Hospice and Palliative Medicine) Gudena, Vinay, MD as Consulting Physician (Hematology and Oncology) Causey, Lindsey Cornetto, NP as Nurse Practitioner (Hematology and Oncology)  DIAGNOSIS:    ICD-10-CM   1. Malignant neoplasm of lower-outer quadrant of left breast of female, estrogen receptor negative (HCC)  C50.512    Z17.1     SUMMARY OF ONCOLOGIC HISTORY: 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, estrogen receptor negative (HCC)  10/20/2016 Mammogram   Mammogram and ultrasound of the left breast revealed 1.7 cm mass at 4:00 position, 6:30 position 5 x 4 x 4 mm mass, 6:00 position 5 cm nipple 7 x 6 x 11 mm, left axillary lymph node with thickened cortex, T1c N1 stage II a AJCC 8   10/24/2016 Initial Diagnosis   Left breast biopsy 6:30 position 3 cm from nipple: IDC grade 2, DCIS, ER 0%, PR 0%, Ki-67 15% HER-2 positive ratio 2.1; 4:00 position 3 cm from nipple: IDC grade 2, DCIS, ER 0%, PR 0%, Ki-67 35%, HER-2 positive ratio 2.02; left axillary lymph node biopsy positive   11/04/2016 - 02/17/2017 Neo-Adjuvant Chemotherapy   TCH Perjeta 6 cycles followed by Herceptin + Perjeta maintenance to be completed September 2019   11/30/2016 Genetic Testing   Negative genetic testing on the common hereditary cancer panel.  The Hereditary Gene Panel offered by [facility] includes sequencing and/or deletion duplication testing of the following 47 genes: APC, ATM, AXIN2, BARD1, BMPR1A, BRCA1, BRCA2, BRIP1, CDH1, CDK4, CDKN2A (p14ARF), CDKN2A (p16INK4a), CHEK2, CTNNA1, DICER1, EPCAM (Deletion/duplication testing only), GREM1 (promoter region deletion/duplication testing only), KIT, MEN1, MLH1, MSH2, MSH3, MSH6, MUTYH, NBN, NF1, NHTL1, PALB2, PDGFRA,  PMS2, POLD1, POLE, PTEN, RAD50, RAD51C, RAD51D, SDHB, SDHC, SDHD, SMAD4, SMARCA4. STK11, TP53, TSC1, TSC2, and VHL.  The following genes were evaluated for sequence changes only: SDHA and HOXB13 c.251G>A variant only. The report date is November 30, 2016.    03/27/2017 Surgery   Bilateral mastectomies: Left mastectomy: IDC grade 2 0.9 cm, nodes negative, right mastectomy benign, ER 0%, PR 0%, HER-2 positive ratio 2.6   05/08/2017 - 06/09/2017 Radiation Therapy   Adjuvant radiation therapy   10/23/2017 Miscellaneous   Neratinib discontinued after 4 weeks for severe diarrhea   07/25/2018 Relapse/Recurrence   MRI of right elbow showed bone lesion consistent with malignancy. PET scan showed bilateral pulmonary nodules and several lytic bone lesions compatible with metastatic disease. Brain MRI on 08/02/18 showed no evidence of metastatic disease.   08/02/2018 PET scan   Bilateral hypermetabolic lung nodules, LUL 1.3 cm with SUV 3.88, lingular nodule 1.4 cm SUV 3.7, central lingular nodule 1.2 cm SUV 9.76, right middle lobe nodule 1.5 cm SUV 9.9, lytic bone metastases inferior pubic ramus, sacrum, T12, right 11th rib.   08/08/2018 Procedure   Lung biopsy: metastatic carcinoma, HER-2 negative (0), ER/PR negative.   08/10/2018 -  Radiation Therapy   Palliative radiatio to the right humerus along the medial condyle   08/24/2018 - 09/05/2018 Radiation Therapy   Palliative radiation to the right 11th rib and right elbow   09/26/2018 - 12/04/2018 Chemotherapy   Carboplatin atezolizumab at [facility] with Dr. Lisa Carey on [facility] 043 clinical trial stopped because of new T5 metastases (toxicities included myopathy required prednisone, immune mediated thyroiditis), right upper   extremity DVT on apixaban   12/14/2018 -  Chemotherapy   The patient had pegfilgrastim-bmez (ZIEXTENZO) injection 6 mg, 6 mg, Subcutaneous,  Once, 1 of 2 cycles eriBULin mesylate (HALAVEN) 2.7 mg in sodium chloride 0.9 % 100 mL chemo  infusion, 1.42 mg/m2 = 2.65 mg, Intravenous,  Once, 3 of 4 cycles Dose modification: 1.1 mg/m2 (original dose 1.4 mg/m2, Cycle 2, Reason: Dose not tolerated, Comment: neutropenic fever), 0.7 mg/m2 (original dose 1.4 mg/m2, Cycle 4, Reason: Provider Judgment), 0.7 mg/m2 (original dose 1.4 mg/m2, Cycle 4, Reason: Provider Judgment) Administration: 2.7 mg (12/14/2018), 2.7 mg (12/21/2018), 2 mg (01/03/2019), 2 mg (01/10/2019), 1.35 mg (02/15/2019)  for chemotherapy treatment.    12/17/2018 -  Radiation Therapy   Palliative radiation to sternal, sacral & pelvic lesions and SRS for T3 and C7-T1 lesions.   Bone metastases (HCC)  08/07/2018 Initial Diagnosis   Bone metastases (HCC)   12/14/2018 -  Chemotherapy   The patient had pertuzumab (PERJETA) 420 mg in sodium chloride 0.9 % 250 mL chemo infusion, 420 mg (100 % of original dose 420 mg), Intravenous, Once, 3 of 6 cycles Dose modification: 420 mg (original dose 420 mg, Cycle 1, Reason: Provider Judgment) Administration: 420 mg (12/14/2018), 420 mg (01/10/2019), 420 mg (01/30/2019) trastuzumab-dkst (OGIVRI) 600 mg in sodium chloride 0.9 % 250 mL chemo infusion, 609 mg, Intravenous,  Once, 3 of 6 cycles Administration: 600 mg (12/14/2018), 450 mg (01/10/2019), 450 mg (01/30/2019)  for chemotherapy treatment.    12/17/2018 -  Radiation Therapy   Palliative radiation to sternal, sacral & pelvic lesions and SRS for T3 and C7-T1 lesions.     CHIEF COMPLIANT: Follow-up of metastatic breast cancer on[place] with Herceptin and Perjeta  INTERVAL HISTORY: Laura Anthony is a 43 y.o. with above-mentioned history of metastatic breast cancerwith metastasestothe lungs and bone. She is currently on treatment with[place] with Herceptin and Perjeta.She presents to the clinic todayfor treatment.shes having back pain but doing better on methadone. She gets slight fevers after neulasta inj. Poor appetite and fatigue  ALLERGIES:  is allergic to  sumatriptan; statins; and tape.  MEDICATIONS:  Current Outpatient Medications  Medication Sig Dispense Refill  . albuterol (VENTOLIN HFA) 108 (90 Base) MCG/ACT inhaler Inhale 2 puffs into the lungs every 6 (six) hours as needed for wheezing or shortness of breath. 18 g 2  . amoxicillin-clavulanate (AUGMENTIN) 875-125 MG tablet Take 1 tablet by mouth 2 (two) times daily. 14 tablet 0  . ELIQUIS 5 MG TABS tablet Take 5 mg by mouth 2 (two) times daily.    . FLUoxetine (PROZAC) 20 MG capsule Take 1 capsule (20 mg total) by mouth daily. 90 capsule 0  . FLUoxetine (PROZAC) 40 MG capsule Take 40 mg by mouth every morning.     . fluticasone (FLOVENT HFA) 110 MCG/ACT inhaler Inhale 2 puffs into the lungs 2 (two) times daily. 1 Inhaler 2  . gabapentin (NEURONTIN) 300 MG capsule Take 2 capsules (600 mg total) by mouth at bedtime. 60 capsule 5  . levothyroxine (SYNTHROID) 88 MCG tablet TAKE 1 TABLET (88 MCG TOTAL) BY MOUTH DAILY BEFORE BREAKFAST. 30 tablet 1  . lidocaine-prilocaine (EMLA) cream APPLY 1 APPLICATION TOPICALLY AS NEEDED. 30 g 0  . lisdexamfetamine (VYVANSE) 30 MG capsule Take 30 mg by mouth every morning.     . methadone (DOLOPHINE) 5 MG tablet Take 1 tablet (5 mg total) by mouth every 8 (eight) hours. 92 tablet 0  . naloxone (NARCAN) 4 MG/0.1ML LIQD nasal spray kit   Spray once in one nostril prn overdose, may repeat x 1 1 each 1  . ondansetron (ZOFRAN) 8 MG tablet Take 1 tablet (8 mg total) by mouth 2 (two) times daily as needed (Nausea or vomiting). 30 tablet 1  . pantoprazole (PROTONIX) 40 MG tablet Take 1 tablet (40 mg total) by mouth 2 (two) times daily. 60 tablet 0  . polyethylene glycol (MIRALAX / GLYCOLAX) 17 g packet Take 17 g by mouth daily as needed for mild constipation or moderate constipation. 14 each 0  . prochlorperazine (COMPAZINE) 10 MG tablet Take 1 tablet (10 mg total) by mouth every 6 (six) hours as needed (Nausea or vomiting). 30 tablet 1  . rizatriptan (MAXALT) 10 MG  tablet Take 10 mg by mouth daily as needed for migraine (and may repeat once in 2 hours, if no relief).     Marland Kitchen senna-docusate (SENOKOT-S) 8.6-50 MG tablet Take 1 tablet by mouth 2 (two) times daily. (Patient taking differently: Take 1 tablet by mouth 2 (two) times daily as needed for moderate constipation. ) 60 tablet 0  . traZODone (DESYREL) 50 MG tablet Take 1 tablet (50 mg total) by mouth at bedtime as needed for sleep. (Patient taking differently: Take 50 mg by mouth at bedtime. ) 90 tablet 1  . triamcinolone cream (KENALOG) 0.5 % Apply 1 application topically 3 (three) times daily. 60 g 1   No current facility-administered medications for this visit.   Facility-Administered Medications Ordered in Other Visits  Medication Dose Route Frequency Provider Last Rate Last Admin  . sodium chloride flush (NS) 0.9 % injection 10 mL  10 mL Intravenous PRN Nicholas Lose, MD   10 mL at 03/01/19 0927    PHYSICAL EXAMINATION: ECOG PERFORMANCE STATUS: 1 - Symptomatic but completely ambulatory  Vitals:   03/01/19 0941  BP: 121/77  Pulse: (!) 107  Resp: 17  Temp: 98.9 F (37.2 C)  SpO2: 96%   Filed Weights   03/01/19 0941  Weight: 167 lb 14.4 oz (76.2 kg)    LABORATORY DATA:  I have reviewed the data as listed CMP Latest Ref Rng & Units 02/26/2019 02/15/2019 02/07/2019  Glucose 70 - 99 mg/dL 100(H) 114(H) 111(H)  BUN 6 - 20 mg/dL _0 Creatinine 0.44 - 1.00 mg/dL 0.75 0.80 0.81  Sodium 135 - 145 mmol/L 138 139 140  Potassium 3.5 - 5.1 mmol/L 4.0 3.6 3.5  Chloride 98 - 111 mmol/L 103 105 105  CO2 22 - 32 mmol/L _1 Calcium 8.9 - 10.3 mg/dL 8.5(L) 8.5(L) 8.6(L)  Total Protein 6.5 - 8.1 g/dL 7.0 7.1 6.9  Total Bilirubin 0.3 - 1.2 mg/dL 0.2(L) 0.3 0.4  Alkaline Phos 38 - 126 U/L 151(H) 136(H) 131(H)  AST 15 - 41 U/L _2 ALT 0 - 44 U/L _3 Lab Results  Component Value Date   WBC 5.9 03/01/2019   HGB 9.3 (L) 03/01/2019   HCT 28.5 (L) 03/01/2019   MCV 98.3  03/01/2019   PLT 124 (L) 03/01/2019   NEUTROABS 4.2 03/01/2019    ASSESSMENT & PLAN:  Malignant neoplasm of lower-outer quadrant of left breast of female, estrogen receptor negative ([place]) Mammogram and ultrasound of the left breast revealed 1.7 cm mass at 4:00 position, 6:30 position 5 x 4 x 4 mm mass, 6:00 position 5 cm nipple 7 x 6 x 11 mm, left axillary lymph node with thickened cortex, T1c N1 stage II a AJCC 8  10/24/2016: Left breast biopsy 6:30 position 3 cm from nipple: IDC grade 2, DCIS, ER 0%, PR 0%, Ki-67 15% HER-2 positive ratio 2.1; 4:00 position 3 cm from nipple: IDC grade 2, DCIS, ER 0%, PR 0%, Ki-67 35%, HER-2 positive ratio 2.02 Lymph node biopsy positive  Treatment Summary: 1. Neoadjuvant chemotherapy with TCHPcompleted 02/17/2017 this would be followed by Herceptinand Perjetamaintenance for 1 yearcompleted September 2019 2.Bilateral mastectomies 03/28/2016:Bilateral mastectomies: Left mastectomy: IDC grade 2 0.9 cm, nodes negative, right mastectomy benign, ER 0%, PR 0%, HER-2 positive ratio 2.6 3.Adjuvant radiation4/08/2017 to 06/09/2017 4.Neratinib started 10/12/2017 discontinued due to diarrhea 5. Elbow fracture: Due to metastatic disease, palliative radiation therapy 6. Carboplatin atezolizumab at [facility] on a clinical trial [facility] 043 stopped for progression 12/04/2018 ---------------------------------------------------------------------------------------------------------------- Lung nodule biopsy: Metastatic breast cancer triple negative  CT CAP 12/03/2018: Progression of lung nodules and bone metastases done at [facility] CTA 01/2019: improvement in lung nodules Current treatment: Eribulin/Herceptin/Perjeta.  Chemo toxicities: 1.  Neutropenic fevers: We managed to treat her as an outpatient with antibiotics.  Cultures were all negative suspect chemo related 2.  Cytopenias: We changed eribulin to every 2 weeks.  Herceptin and Perjeta being  given once a month.  Dose of eribulin also reduced to 0.7 mg meter square.  3. Thrombocytopenia: platelets today are 124 today  CT 01/20/2019: Improvement in the disease.  F/U with each chemo   No orders of the defined types were placed in this encounter.  The patient has a good understanding of the overall plan. she agrees with it. she will call with any problems that may develop before the next visit here.  Total time spent: 30 mins including face to face time and time spent for planning, charting and coordination of care  Nicholas Lose, MD 03/01/2019  I, Cloyde Reams Dorshimer, am acting as scribe for Dr. Nicholas Lose.  I have reviewed the above documentation for accuracy and completeness, and I agree with the above.

## 2019-03-01 ENCOUNTER — Inpatient Hospital Stay: Payer: 59

## 2019-03-01 ENCOUNTER — Inpatient Hospital Stay (HOSPITAL_BASED_OUTPATIENT_CLINIC_OR_DEPARTMENT_OTHER): Payer: 59 | Admitting: Hematology and Oncology

## 2019-03-01 ENCOUNTER — Encounter: Payer: Self-pay | Admitting: *Deleted

## 2019-03-01 ENCOUNTER — Other Ambulatory Visit: Payer: Self-pay

## 2019-03-01 DIAGNOSIS — C7951 Secondary malignant neoplasm of bone: Secondary | ICD-10-CM

## 2019-03-01 DIAGNOSIS — Z171 Estrogen receptor negative status [ER-]: Secondary | ICD-10-CM

## 2019-03-01 DIAGNOSIS — Z95828 Presence of other vascular implants and grafts: Secondary | ICD-10-CM

## 2019-03-01 DIAGNOSIS — Z7189 Other specified counseling: Secondary | ICD-10-CM

## 2019-03-01 DIAGNOSIS — C50512 Malignant neoplasm of lower-outer quadrant of left female breast: Secondary | ICD-10-CM

## 2019-03-01 DIAGNOSIS — G893 Neoplasm related pain (acute) (chronic): Secondary | ICD-10-CM

## 2019-03-01 LAB — CBC WITH DIFFERENTIAL (CANCER CENTER ONLY)
Abs Immature Granulocytes: 0.02 10*3/uL (ref 0.00–0.07)
Basophils Absolute: 0 10*3/uL (ref 0.0–0.1)
Basophils Relative: 1 %
Eosinophils Absolute: 0 10*3/uL (ref 0.0–0.5)
Eosinophils Relative: 0 %
HCT: 28.5 % — ABNORMAL LOW (ref 36.0–46.0)
Hemoglobin: 9.3 g/dL — ABNORMAL LOW (ref 12.0–15.0)
Immature Granulocytes: 0 %
Lymphocytes Relative: 18 %
Lymphs Abs: 1.1 10*3/uL (ref 0.7–4.0)
MCH: 32.1 pg (ref 26.0–34.0)
MCHC: 32.6 g/dL (ref 30.0–36.0)
MCV: 98.3 fL (ref 80.0–100.0)
Monocytes Absolute: 0.5 10*3/uL (ref 0.1–1.0)
Monocytes Relative: 9 %
Neutro Abs: 4.2 10*3/uL (ref 1.7–7.7)
Neutrophils Relative %: 72 %
Platelet Count: 124 10*3/uL — ABNORMAL LOW (ref 150–400)
RBC: 2.9 MIL/uL — ABNORMAL LOW (ref 3.87–5.11)
RDW: 14.7 % (ref 11.5–15.5)
WBC Count: 5.9 10*3/uL (ref 4.0–10.5)
nRBC: 0 % (ref 0.0–0.2)

## 2019-03-01 LAB — CMP (CANCER CENTER ONLY)
ALT: 13 U/L (ref 0–44)
AST: 19 U/L (ref 15–41)
Albumin: 3 g/dL — ABNORMAL LOW (ref 3.5–5.0)
Alkaline Phosphatase: 159 U/L — ABNORMAL HIGH (ref 38–126)
Anion gap: 7 (ref 5–15)
BUN: 9 mg/dL (ref 6–20)
CO2: 27 mmol/L (ref 22–32)
Calcium: 8.6 mg/dL — ABNORMAL LOW (ref 8.9–10.3)
Chloride: 104 mmol/L (ref 98–111)
Creatinine: 0.77 mg/dL (ref 0.44–1.00)
GFR, Est AFR Am: >60 mL/min (ref >60)
GFR, Estimated: >60 mL/min (ref >60)
Glucose, Bld: 108 mg/dL — ABNORMAL HIGH (ref 70–99)
Potassium: 3.8 mmol/L (ref 3.5–5.1)
Sodium: 138 mmol/L (ref 135–145)
Total Bilirubin: 0.3 mg/dL (ref 0.3–1.2)
Total Protein: 7.3 g/dL (ref 6.5–8.1)

## 2019-03-01 MED ORDER — DIPHENHYDRAMINE HCL 25 MG PO CAPS
ORAL_CAPSULE | ORAL | Status: AC
  Filled 2019-03-01: qty 1

## 2019-03-01 MED ORDER — DIPHENHYDRAMINE HCL 25 MG PO CAPS
50.0000 mg | ORAL_CAPSULE | Freq: Once | ORAL | Status: AC
  Administered 2019-03-01: 25 mg via ORAL

## 2019-03-01 MED ORDER — HYDROMORPHONE HCL 2 MG/ML IJ SOLN
INTRAMUSCULAR | Status: AC
  Filled 2019-03-01: qty 1

## 2019-03-01 MED ORDER — SODIUM CHLORIDE 0.9 % IV SOLN
Freq: Once | INTRAVENOUS | Status: AC
  Filled 2019-03-01: qty 250

## 2019-03-01 MED ORDER — HYDROMORPHONE HCL 2 MG/ML IJ SOLN
2.0000 mg | INTRAMUSCULAR | Status: DC | PRN
  Administered 2019-03-01: 2 mg via INTRAVENOUS
  Filled 2019-03-01: qty 1

## 2019-03-01 MED ORDER — PROCHLORPERAZINE MALEATE 10 MG PO TABS
ORAL_TABLET | ORAL | Status: AC
  Filled 2019-03-01: qty 1

## 2019-03-01 MED ORDER — SODIUM CHLORIDE 0.9% FLUSH
10.0000 mL | INTRAVENOUS | Status: DC | PRN
  Administered 2019-03-01 (×2): 10 mL via INTRAVENOUS
  Filled 2019-03-01: qty 10

## 2019-03-01 MED ORDER — PROCHLORPERAZINE MALEATE 10 MG PO TABS
10.0000 mg | ORAL_TABLET | Freq: Once | ORAL | Status: AC
  Administered 2019-03-01: 10 mg via ORAL

## 2019-03-01 MED ORDER — ACETAMINOPHEN 325 MG PO TABS
ORAL_TABLET | ORAL | Status: AC
  Filled 2019-03-01: qty 2

## 2019-03-01 MED ORDER — GABAPENTIN 300 MG PO CAPS: 600.0000 mg | ORAL_CAPSULE | Freq: Every day | ORAL | 5 refills | Status: DC

## 2019-03-01 MED ORDER — ACETAMINOPHEN 325 MG PO TABS
650.0000 mg | ORAL_TABLET | Freq: Once | ORAL | Status: AC
  Administered 2019-03-01: 650 mg via ORAL

## 2019-03-01 MED ORDER — ELIQUIS 5 MG PO TABS: 5.0000 mg | ORAL_TABLET | Freq: Two times a day (BID) | ORAL | 3 refills | Status: DC

## 2019-03-01 MED ORDER — FLUOXETINE HCL 60 MG PO TABS: 60.0000 mg | ORAL_TABLET | Freq: Every day | ORAL | 6 refills | Status: DC

## 2019-03-01 MED ORDER — SODIUM CHLORIDE 0.9 % IV SOLN
0.7000 mg/m2 | Freq: Once | INTRAVENOUS | Status: AC
  Administered 2019-03-01: 1.35 mg via INTRAVENOUS
  Filled 2019-03-01: qty 2.7

## 2019-03-01 MED ORDER — HEPARIN SOD (PORK) LOCK FLUSH 100 UNIT/ML IV SOLN
500.0000 [IU] | Freq: Once | INTRAVENOUS | Status: AC | PRN
  Administered 2019-03-01: 500 [IU]
  Filled 2019-03-01: qty 5

## 2019-03-01 MED ORDER — METHADONE HCL 10 MG PO TABS: 10.0000 mg | ORAL_TABLET | Freq: Four times a day (QID) | ORAL | 0 refills | Status: DC | PRN

## 2019-03-01 MED ORDER — SODIUM CHLORIDE 0.9% FLUSH
10.0000 mL | INTRAVENOUS | Status: DC | PRN
  Filled 2019-03-01: qty 10

## 2019-03-01 MED ORDER — TRASTUZUMAB-DKST CHEMO 150 MG IV SOLR
450.0000 mg | Freq: Once | INTRAVENOUS | Status: AC
  Administered 2019-03-01: 450 mg via INTRAVENOUS
  Filled 2019-03-01: qty 21.43

## 2019-03-01 MED ORDER — SODIUM CHLORIDE 0.9 % IV SOLN
420.0000 mg | Freq: Once | INTRAVENOUS | Status: AC
  Administered 2019-03-01: 420 mg via INTRAVENOUS
  Filled 2019-03-01: qty 14

## 2019-03-01 NOTE — Patient Instructions (Signed)
Trastuzumab injection for infusion What is this medicine? TRASTUZUMAB (tras TOO zoo mab) is a monoclonal antibody. It is used to treat breast cancer and stomach cancer. This medicine may be used for other purposes; ask your health care provider or pharmacist if you have questions. COMMON BRAND NAME(S): Herceptin, Herzuma, KANJINTI, Ogivri, Ontruzant, Trazimera What should I tell my health care provider before I take this medicine? They need to know if you have any of these conditions:  heart disease  heart failure  lung or breathing disease, like asthma  an unusual or allergic reaction to trastuzumab, benzyl alcohol, or other medications, foods, dyes, or preservatives  pregnant or trying to get pregnant  breast-feeding How should I use this medicine? This drug is given as an infusion into a vein. It is administered in a hospital or clinic by a specially trained health care professional. Talk to your pediatrician regarding the use of this medicine in children. This medicine is not approved for use in children. Overdosage: If you think you have taken too much of this medicine contact a poison control center or emergency room at once. NOTE: This medicine is only for you. Do not share this medicine with others. What if I miss a dose? It is important not to miss a dose. Call your doctor or health care professional if you are unable to keep an appointment. What may interact with this medicine? This medicine may interact with the following medications:  certain types of chemotherapy, such as daunorubicin, doxorubicin, epirubicin, and idarubicin This list may not describe all possible interactions. Give your health care provider a list of all the medicines, herbs, non-prescription drugs, or dietary supplements you use. Also tell them if you smoke, drink alcohol, or use illegal drugs. Some items may interact with your medicine. What should I watch for while using this medicine? Visit your  doctor for checks on your progress. Report any side effects. Continue your course of treatment even though you feel ill unless your doctor tells you to stop. Call your doctor or health care professional for advice if you get a fever, chills or sore throat, or other symptoms of a cold or flu. Do not treat yourself. Try to avoid being around people who are sick. You may experience fever, chills and shaking during your first infusion. These effects are usually mild and can be treated with other medicines. Report any side effects during the infusion to your health care professional. Fever and chills usually do not happen with later infusions. Do not become pregnant while taking this medicine or for 7 months after stopping it. Women should inform their doctor if they wish to become pregnant or think they might be pregnant. Women of child-bearing potential will need to have a negative pregnancy test before starting this medicine. There is a potential for serious side effects to an unborn child. Talk to your health care professional or pharmacist for more information. Do not breast-feed an infant while taking this medicine or for 7 months after stopping it. Women must use effective birth control with this medicine. What side effects may I notice from receiving this medicine? Side effects that you should report to your doctor or health care professional as soon as possible:  allergic reactions like skin rash, itching or hives, swelling of the face, lips, or tongue  chest pain or palpitations  cough  dizziness  feeling faint or lightheaded, falls  fever  general ill feeling or flu-like symptoms  signs of worsening heart failure like   breathing problems; swelling in your legs and feet  unusually weak or tired Side effects that usually do not require medical attention (report to your doctor or health care professional if they continue or are bothersome):  bone pain  changes in  taste  diarrhea  joint pain  nausea/vomiting  weight loss This list may not describe all possible side effects. Call your doctor for medical advice about side effects. You may report side effects to FDA at 1-800-FDA-1088. Where should I keep my medicine? This drug is given in a hospital or clinic and will not be stored at home. NOTE: This sheet is a summary. It may not cover all possible information. If you have questions about this medicine, talk to your doctor, pharmacist, or health care provider.  2020 Elsevier/Gold Standard (2016-01-12 14:37:52)  Pertuzumab injection What is this medicine? PERTUZUMAB (per TOOZ ue mab) is a monoclonal antibody. It is used to treat breast cancer. This medicine may be used for other purposes; ask your health care provider or pharmacist if you have questions. COMMON BRAND NAME(S): PERJETA What should I tell my health care provider before I take this medicine? They need to know if you have any of these conditions:  heart disease  heart failure  high blood pressure  history of irregular heart beat  recent or ongoing radiation therapy  an unusual or allergic reaction to pertuzumab, other medicines, foods, dyes, or preservatives  pregnant or trying to get pregnant  breast-feeding How should I use this medicine? This medicine is for infusion into a vein. It is given by a health care professional in a hospital or clinic setting. Talk to your pediatrician regarding the use of this medicine in children. Special care may be needed. Overdosage: If you think you have taken too much of this medicine contact a poison control center or emergency room at once. NOTE: This medicine is only for you. Do not share this medicine with others. What if I miss a dose? It is important not to miss your dose. Call your doctor or health care professional if you are unable to keep an appointment. What may interact with this medicine? Interactions are not  expected. Give your health care provider a list of all the medicines, herbs, non-prescription drugs, or dietary supplements you use. Also tell them if you smoke, drink alcohol, or use illegal drugs. Some items may interact with your medicine. This list may not describe all possible interactions. Give your health care provider a list of all the medicines, herbs, non-prescription drugs, or dietary supplements you use. Also tell them if you smoke, drink alcohol, or use illegal drugs. Some items may interact with your medicine. What should I watch for while using this medicine? Your condition will be monitored carefully while you are receiving this medicine. Report any side effects. Continue your course of treatment even though you feel ill unless your doctor tells you to stop. Do not become pregnant while taking this medicine or for 7 months after stopping it. Women should inform their doctor if they wish to become pregnant or think they might be pregnant. Women of child-bearing potential will need to have a negative pregnancy test before starting this medicine. There is a potential for serious side effects to an unborn child. Talk to your health care professional or pharmacist for more information. Do not breast-feed an infant while taking this medicine or for 7 months after stopping it. Women must use effective birth control with this medicine. Call your doctor or   health care professional for advice if you get a fever, chills or sore throat, or other symptoms of a cold or flu. Do not treat yourself. Try to avoid being around people who are sick. You may experience fever, chills, and headache during the infusion. Report any side effects during the infusion to your health care professional. What side effects may I notice from receiving this medicine? Side effects that you should report to your doctor or health care professional as soon as possible:  breathing problems  chest pain or  palpitations  dizziness  feeling faint or lightheaded  fever or chills  skin rash, itching or hives  sore throat  swelling of the face, lips, or tongue  swelling of the legs or ankles  unusually weak or tired Side effects that usually do not require medical attention (report to your doctor or health care professional if they continue or are bothersome):  diarrhea  hair loss  nausea, vomiting  tiredness This list may not describe all possible side effects. Call your doctor for medical advice about side effects. You may report side effects to FDA at 1-800-FDA-1088. Where should I keep my medicine? This drug is given in a hospital or clinic and will not be stored at home. NOTE: This sheet is a summary. It may not cover all possible information. If you have questions about this medicine, talk to your doctor, pharmacist, or health care provider.  2020 Elsevier/Gold Standard (2015-02-19 12:08:50)   Eribulin solution for injection What is this medicine? ERIBULIN (er e bu lin) is a chemotherapy drug. It is used to treat breast cancer and liposarcoma. This medicine may be used for other purposes; ask your health care provider or pharmacist if you have questions. COMMON BRAND NAME(S): Halaven What should I tell my health care provider before I take this medicine? They need to know if you have any of these conditions:  heart disease  history of irregular heartbeat  kidney disease  liver disease  low blood counts, like low white cell, platelet, or red cell counts  low levels of potassium or magnesium in the blood  an unusual or allergic reaction to eribulin, other medicines, foods, dyes, or preservatives  pregnant or trying to get pregnant  breast-feeding How should I use this medicine? This medicine is for infusion into a vein. It is given by a health care professional in a hospital or clinic setting. Talk to your pediatrician regarding the use of this medicine in  children. Special care may be needed. Overdosage: If you think you have taken too much of this medicine contact a poison control center or emergency room at once. NOTE: This medicine is only for you. Do not share this medicine with others. What if I miss a dose? It is important not to miss your dose. Call your doctor or health care professional if you are unable to keep an appointment. What may interact with this medicine? Do not take this medicine with any of the following medications:  amiodarone  astemizole  arsenic trioxide  bepridil  bretylium  chloroquine  chlorpromazine  cisapride  clarithromycin  dextromethorphan, quinidine  disopyramide  droperidol  dronedarone  erythromycin  grepafloxacin  halofantrine  haloperidol  ibutilide  levomethadyl  mesoridazine  methadone  pentamidine  procainamide  quinidine  pimozide  posaconazole  probucol  propafenone  saquinavir  sotalol  sparfloxacin  terfenadine  thioridazine  troleandomycin This medicine may also interact with the following medications:  dofetilide  ziprasidone This list may not  describe all possible interactions. Give your health care provider a list of all the medicines, herbs, non-prescription drugs, or dietary supplements you use. Also tell them if you smoke, drink alcohol, or use illegal drugs. Some items may interact with your medicine. What should I watch for while using this medicine? This drug may make you feel generally unwell. This is not uncommon, as chemotherapy can affect healthy cells as well as cancer cells. Report any side effects. Continue your course of treatment even though you feel ill unless your doctor tells you to stop. Call your doctor or health care professional for advice if you get a fever, chills or sore throat, or other symptoms of a cold or flu. Do not treat yourself. This drug decreases your body's ability to fight infections. Try to avoid  being around people who are sick. This medicine may increase your risk to bruise or bleed. Call your doctor or health care professional if you notice any unusual bleeding. You may need blood work done while you are taking this medicine. Do not become pregnant while taking this medicine or for 2 weeks after stopping it. Women should inform their doctor if they wish to become pregnant or think they might be pregnant. Men should not father a child while taking this medicine and for 3.5 months after stopping it. There is a potential for serious side effects to an unborn child. Talk to your health care professional or pharmacist for more information. Do not breast-feed an infant while taking this medicine or for 2 weeks after stopping it. What side effects may I notice from receiving this medicine? Side effects that you should report to your doctor or health care professional as soon as possible:  allergic reactions like skin rash, itching or hives, swelling of the face, lips, or tongue  low blood counts - this medicine may decrease the number of white blood cells, red blood cells and platelets. You may be at increased risk for infections and bleeding.  signs of infection - fever or chills, cough, sore throat, pain or difficulty passing urine  signs of decreased platelets or bleeding - bruising, pinpoint red spots on the skin, black, tarry stools, blood in the urine  signs of decreased red blood cells - unusually weak or tired, fainting spells, lightheadedness  pain, tingling, numbness in the hands or feet Side effects that usually do not require medical attention (report to your doctor or health care professional if they continue or are bothersome):  constipation  hair loss  headache  loss of appetite  muscle or joint pain  nausea, vomiting  stomach pain This list may not describe all possible side effects. Call your doctor for medical advice about side effects. You may report side  effects to FDA at 1-800-FDA-1088. Where should I keep my medicine? This drug is given in a hospital or clinic and will not be stored at home. NOTE: This sheet is a summary. It may not cover all possible information. If you have questions about this medicine, talk to your doctor, pharmacist, or health care provider.  2020 Elsevier/Gold Standard (2018-01-08 11:00:16)

## 2019-03-01 NOTE — Patient Instructions (Signed)

## 2019-03-01 NOTE — Progress Notes (Signed)
These preliminary result these preliminary results were noted.  Awaiting final report.

## 2019-03-01 NOTE — Assessment & Plan Note (Signed)
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 Neoadjuvant chemotherapy with TCHPcompleted 02/17/2017 this would be followed by Herceptinand Perjetamaintenance for 1 yearcompleted September 2019 2.Bilateral mastectomies 03/28/2016:Bilateral mastectomies: Left mastectomy: IDC grade 2 0.9 cm, nodes negative, right mastectomy benign, ER 0%, PR 0%, HER-2 positive ratio 2.6 3.Adjuvant radiation4/08/2017 to 06/09/2017 4.Neratinib started 10/12/2017 discontinued due to diarrhea 5. Elbow fracture: Due to metastatic disease, palliative radiation therapy 6. 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  Chemo toxicities: 1.  Neutropenic fevers: We managed to treat her as an outpatient with antibiotics.  Cultures were all negative suspect chemo related 2.  Cytopenias: We changed eribulin to every 2 weeks.  Herceptin and Perjeta being given once a month.  Dose of eribulin also reduced to 0.7 mg meter square.  CT 01/20/2019: Improvement in the disease. In 1 month we will obtain additional CT scans and follow-up.

## 2019-03-02 ENCOUNTER — Inpatient Hospital Stay: Payer: 59

## 2019-03-02 ENCOUNTER — Other Ambulatory Visit: Payer: Self-pay

## 2019-03-02 VITALS — BP 135/89 | HR 108 | Temp 98.7°F | Resp 16

## 2019-03-02 DIAGNOSIS — C50512 Malignant neoplasm of lower-outer quadrant of left female breast: Secondary | ICD-10-CM | POA: Diagnosis not present

## 2019-03-02 DIAGNOSIS — Z7189 Other specified counseling: Secondary | ICD-10-CM

## 2019-03-02 DIAGNOSIS — C7951 Secondary malignant neoplasm of bone: Secondary | ICD-10-CM

## 2019-03-02 DIAGNOSIS — Z171 Estrogen receptor negative status [ER-]: Secondary | ICD-10-CM

## 2019-03-02 MED ORDER — PEGFILGRASTIM-BMEZ 6 MG/0.6ML ~~LOC~~ SOSY
6.0000 mg | PREFILLED_SYRINGE | Freq: Once | SUBCUTANEOUS | Status: AC
  Administered 2019-03-02: 6 mg via SUBCUTANEOUS

## 2019-03-02 NOTE — Patient Instructions (Signed)
Pegfilgrastim injection What is this medicine? PEGFILGRASTIM (PEG fil gra stim) is a long-acting granulocyte colony-stimulating factor that stimulates the growth of neutrophils, a type of white blood cell important in the body's fight against infection. It is used to reduce the incidence of fever and infection in patients with certain types of cancer who are receiving chemotherapy that affects the bone marrow, and to increase survival after being exposed to high doses of radiation. This medicine may be used for other purposes; ask your health care provider or pharmacist if you have questions. COMMON BRAND NAME(S): Fulphila, Neulasta, UDENYCA, Ziextenzo What should I tell my health care provider before I take this medicine? They need to know if you have any of these conditions:  kidney disease  latex allergy  ongoing radiation therapy  sickle cell disease  skin reactions to acrylic adhesives (On-Body Injector only)  an unusual or allergic reaction to pegfilgrastim, filgrastim, other medicines, foods, dyes, or preservatives  pregnant or trying to get pregnant  breast-feeding How should I use this medicine? This medicine is for injection under the skin. If you get this medicine at home, you will be taught how to prepare and give the pre-filled syringe or how to use the On-body Injector. Refer to the patient Instructions for Use for detailed instructions. Use exactly as directed. Tell your healthcare provider immediately if you suspect that the On-body Injector may not have performed as intended or if you suspect the use of the On-body Injector resulted in a missed or partial dose. It is important that you put your used needles and syringes in a special sharps container. Do not put them in a trash can. If you do not have a sharps container, call your pharmacist or healthcare provider to get one. Talk to your pediatrician regarding the use of this medicine in children. While this drug may be  prescribed for selected conditions, precautions do apply. Overdosage: If you think you have taken too much of this medicine contact a poison control center or emergency room at once. NOTE: This medicine is only for you. Do not share this medicine with others. What if I miss a dose? It is important not to miss your dose. Call your doctor or health care professional if you miss your dose. If you miss a dose due to an On-body Injector failure or leakage, a new dose should be administered as soon as possible using a single prefilled syringe for manual use. What may interact with this medicine? Interactions have not been studied. Give your health care provider a list of all the medicines, herbs, non-prescription drugs, or dietary supplements you use. Also tell them if you smoke, drink alcohol, or use illegal drugs. Some items may interact with your medicine. This list may not describe all possible interactions. Give your health care provider a list of all the medicines, herbs, non-prescription drugs, or dietary supplements you use. Also tell them if you smoke, drink alcohol, or use illegal drugs. Some items may interact with your medicine. What should I watch for while using this medicine? You may need blood work done while you are taking this medicine. If you are going to need a MRI, CT scan, or other procedure, tell your doctor that you are using this medicine (On-Body Injector only). What side effects may I notice from receiving this medicine? Side effects that you should report to your doctor or health care professional as soon as possible:  allergic reactions like skin rash, itching or hives, swelling of the   face, lips, or tongue  back pain  dizziness  fever  pain, redness, or irritation at site where injected  pinpoint red spots on the skin  red or dark-brown urine  shortness of breath or breathing problems  stomach or side pain, or pain at the  shoulder  swelling  tiredness  trouble passing urine or change in the amount of urine Side effects that usually do not require medical attention (report to your doctor or health care professional if they continue or are bothersome):  bone pain  muscle pain This list may not describe all possible side effects. Call your doctor for medical advice about side effects. You may report side effects to FDA at 1-800-FDA-1088. Where should I keep my medicine? Keep out of the reach of children. If you are using this medicine at home, you will be instructed on how to store it. Throw away any unused medicine after the expiration date on the label. NOTE: This sheet is a summary. It may not cover all possible information. If you have questions about this medicine, talk to your doctor, pharmacist, or health care provider.  2020 Elsevier/Gold Standard (2017-04-24 16:57:08)  Coronavirus (COVID-19) Are you at risk?  Are you at risk for the Coronavirus (COVID-19)?  To be considered HIGH RISK for Coronavirus (COVID-19), you have to meet the following criteria:  . Traveled to [place], [place], [place], [place] or [place]; or in the [place] to [place], [place], [place], or [place]; and have fever, cough, and shortness of breath within the last 2 weeks of travel OR . Been in close contact with a person diagnosed with COVID-19 within the last 2 weeks and have fever, cough, and shortness of breath . IF YOU DO NOT MEET THESE CRITERIA, YOU ARE CONSIDERED LOW RISK FOR COVID-19.  What to do if you are HIGH RISK for COVID-19?  Marland Kitchen If you are having a medical emergency, call 911. . Seek medical care right away. Before you go to a doctor's office, urgent care or emergency department, call ahead and tell them about your recent travel, contact with someone diagnosed with COVID-19, and your symptoms. You should receive instructions from your physician's office regarding next steps of care.  . When you arrive  at healthcare provider, tell the healthcare staff immediately you have returned from visiting [place], [place], [place], [place] or [place]; or traveled in the [place] to [place], [place], [place], or [place]; in the last two weeks or you have been in close contact with a person diagnosed with COVID-19 in the last 2 weeks.   . Tell the health care staff about your symptoms: fever, cough and shortness of breath. . After you have been seen by a medical provider, you will be either: o Tested for (COVID-19) and discharged home on quarantine except to seek medical care if symptoms worsen, and asked to  - Stay home and avoid contact with others until you get your results (4-5 days)  - Avoid travel on public transportation if possible (such as bus, train, or airplane) or o Sent to the Emergency Department by EMS for evaluation, COVID-19 testing, and possible admission depending on your condition and test results.  What to do if you are LOW RISK for COVID-19?  Reduce your risk of any infection by using the same precautions used for avoiding the common cold or flu:  Marland Kitchen Wash your hands often with soap and warm water for at least 20 seconds.  If soap  and water are not readily available, use an alcohol-based hand sanitizer with at least 60% alcohol.  . If coughing or sneezing, cover your mouth and nose by coughing or sneezing into the elbow areas of your shirt or coat, into a tissue or into your sleeve (not your hands). . Avoid shaking hands with others and consider head nods or verbal greetings only. . Avoid touching your eyes, nose, or mouth with unwashed hands.  . Avoid close contact with people who are sick. . Avoid places or events with large numbers of people in one location, like concerts or sporting events. . Carefully consider travel plans you have or are making. . If you are planning any travel outside or inside the [place], visit the CDC's Travelers' Health webpage for the latest health  notices. . If you have some symptoms but not all symptoms, continue to monitor at home and seek medical attention if your symptoms worsen. . If you are having a medical emergency, call 911.   [place] / e-Visit: [email or web address]         [facility]: [facility]: [id number]                   [facility]: [phone]

## 2019-03-03 LAB — CULTURE, BLOOD (SINGLE)
Culture: NO GROWTH
Culture: NO GROWTH
Special Requests: ADEQUATE
Special Requests: ADEQUATE

## 2019-03-04 ENCOUNTER — Other Ambulatory Visit: Payer: Self-pay

## 2019-03-04 ENCOUNTER — Telehealth: Payer: Self-pay

## 2019-03-04 ENCOUNTER — Ambulatory Visit (HOSPITAL_COMMUNITY)
Admission: RE | Admit: 2019-03-04 | Discharge: 2019-03-04 | Disposition: A | Discharge status: Home / Self Care | Payer: 59 | Source: Ambulatory Visit | Attending: Adult Health | Admitting: Adult Health

## 2019-03-04 ENCOUNTER — Telehealth: Payer: Self-pay | Admitting: Hematology and Oncology

## 2019-03-04 DIAGNOSIS — Z171 Estrogen receptor negative status [ER-]: Secondary | ICD-10-CM | POA: Diagnosis not present

## 2019-03-04 DIAGNOSIS — C50512 Malignant neoplasm of lower-outer quadrant of left female breast: Secondary | ICD-10-CM

## 2019-03-04 DIAGNOSIS — C7951 Secondary malignant neoplasm of bone: Secondary | ICD-10-CM | POA: Insufficient documentation

## 2019-03-04 NOTE — Telephone Encounter (Signed)
Pt scheduled for CBC on 2/3.  Pt needs results called or faxed to Dr. Luan Pulling office when final.    Contact number [phone].

## 2019-03-04 NOTE — Telephone Encounter (Signed)
I talk with patient regarding schedule 2/11 is at capacity

## 2019-03-04 NOTE — Progress Notes (Signed)
  Echocardiogram 2D Echocardiogram has been performed.  Laura Anthony 03/04/2019, 11:53 AM

## 2019-03-05 ENCOUNTER — Other Ambulatory Visit: Payer: Self-pay | Admitting: *Deleted

## 2019-03-05 MED ORDER — FLUOXETINE HCL 20 MG PO CAPS: 20.0000 mg | ORAL_CAPSULE | Freq: Every day | ORAL | 1 refills | Status: DC

## 2019-03-05 MED ORDER — FLUOXETINE HCL 40 MG PO CAPS: 40.0000 mg | ORAL_CAPSULE | Freq: Every day | ORAL | 1 refills | Status: DC

## 2019-03-06 ENCOUNTER — Other Ambulatory Visit: Payer: Self-pay

## 2019-03-06 ENCOUNTER — Inpatient Hospital Stay: Payer: 59 | Attending: Hematology and Oncology

## 2019-03-06 ENCOUNTER — Encounter: Payer: Self-pay | Admitting: *Deleted

## 2019-03-06 DIAGNOSIS — C50512 Malignant neoplasm of lower-outer quadrant of left female breast: Secondary | ICD-10-CM | POA: Diagnosis not present

## 2019-03-06 DIAGNOSIS — Z79899 Other long term (current) drug therapy: Secondary | ICD-10-CM | POA: Insufficient documentation

## 2019-03-06 DIAGNOSIS — Z5112 Encounter for antineoplastic immunotherapy: Secondary | ICD-10-CM | POA: Diagnosis not present

## 2019-03-06 DIAGNOSIS — C7951 Secondary malignant neoplasm of bone: Secondary | ICD-10-CM | POA: Diagnosis not present

## 2019-03-06 DIAGNOSIS — Z923 Personal history of irradiation: Secondary | ICD-10-CM | POA: Insufficient documentation

## 2019-03-06 DIAGNOSIS — Z171 Estrogen receptor negative status [ER-]: Secondary | ICD-10-CM | POA: Diagnosis not present

## 2019-03-06 DIAGNOSIS — Z7189 Other specified counseling: Secondary | ICD-10-CM

## 2019-03-06 DIAGNOSIS — Z9221 Personal history of antineoplastic chemotherapy: Secondary | ICD-10-CM | POA: Diagnosis not present

## 2019-03-06 DIAGNOSIS — R918 Other nonspecific abnormal finding of lung field: Secondary | ICD-10-CM | POA: Diagnosis not present

## 2019-03-06 DIAGNOSIS — Z9013 Acquired absence of bilateral breasts and nipples: Secondary | ICD-10-CM | POA: Diagnosis not present

## 2019-03-06 LAB — CMP (CANCER CENTER ONLY)
ALT: 14 U/L (ref 0–44)
AST: 25 U/L (ref 15–41)
Albumin: 3.3 g/dL — ABNORMAL LOW (ref 3.5–5.0)
Alkaline Phosphatase: 186 U/L — ABNORMAL HIGH (ref 38–126)
Anion gap: 9 (ref 5–15)
BUN: 5 mg/dL — ABNORMAL LOW (ref 6–20)
CO2: 27 mmol/L (ref 22–32)
Calcium: 8.9 mg/dL (ref 8.9–10.3)
Chloride: 103 mmol/L (ref 98–111)
Creatinine: 0.84 mg/dL (ref 0.44–1.00)
GFR, Est AFR Am: >60 mL/min (ref >60)
GFR, Estimated: >60 mL/min (ref >60)
Glucose, Bld: 143 mg/dL — ABNORMAL HIGH (ref 70–99)
Potassium: 3.8 mmol/L (ref 3.5–5.1)
Sodium: 139 mmol/L (ref 135–145)
Total Bilirubin: 0.5 mg/dL (ref 0.3–1.2)
Total Protein: 7.4 g/dL (ref 6.5–8.1)

## 2019-03-06 LAB — CBC WITH DIFFERENTIAL (CANCER CENTER ONLY)
Abs Immature Granulocytes: 0.07 10*3/uL (ref 0.00–0.07)
Basophils Absolute: 0.1 10*3/uL (ref 0.0–0.1)
Basophils Relative: 1 %
Eosinophils Absolute: 0 10*3/uL (ref 0.0–0.5)
Eosinophils Relative: 0 %
HCT: 28.7 % — ABNORMAL LOW (ref 36.0–46.0)
Hemoglobin: 9.4 g/dL — ABNORMAL LOW (ref 12.0–15.0)
Immature Granulocytes: 1 %
Lymphocytes Relative: 20 %
Lymphs Abs: 1.3 10*3/uL (ref 0.7–4.0)
MCH: 32.8 pg (ref 26.0–34.0)
MCHC: 32.8 g/dL (ref 30.0–36.0)
MCV: 100 fL (ref 80.0–100.0)
Monocytes Absolute: 0.7 10*3/uL (ref 0.1–1.0)
Monocytes Relative: 11 %
Neutro Abs: 4.6 10*3/uL (ref 1.7–7.7)
Neutrophils Relative %: 67 %
Platelet Count: 115 10*3/uL — ABNORMAL LOW (ref 150–400)
RBC: 2.87 MIL/uL — ABNORMAL LOW (ref 3.87–5.11)
RDW: 15 % (ref 11.5–15.5)
WBC Count: 6.8 10*3/uL (ref 4.0–10.5)
nRBC: 0 % (ref 0.0–0.2)

## 2019-03-06 NOTE — Progress Notes (Signed)
Per pt request, lab work faxed to Dr. Maryjean Ka with [facility] [phone])

## 2019-03-10 ENCOUNTER — Other Ambulatory Visit: Payer: Self-pay | Admitting: Hematology and Oncology

## 2019-03-11 ENCOUNTER — Other Ambulatory Visit: Payer: Self-pay | Admitting: *Deleted

## 2019-03-11 MED ORDER — LEVOTHYROXINE SODIUM 88 MCG PO TABS: 88.0000 ug | ORAL_TABLET | Freq: Every day | ORAL | 0 refills | Status: DC

## 2019-03-15 ENCOUNTER — Inpatient Hospital Stay: Payer: 59

## 2019-03-15 ENCOUNTER — Other Ambulatory Visit: Payer: Self-pay

## 2019-03-15 ENCOUNTER — Other Ambulatory Visit: Payer: Self-pay | Admitting: Oncology

## 2019-03-15 VITALS — BP 115/72 | HR 73 | Temp 97.8°F | Resp 18

## 2019-03-15 DIAGNOSIS — C7951 Secondary malignant neoplasm of bone: Secondary | ICD-10-CM

## 2019-03-15 DIAGNOSIS — C50512 Malignant neoplasm of lower-outer quadrant of left female breast: Secondary | ICD-10-CM

## 2019-03-15 DIAGNOSIS — Z7189 Other specified counseling: Secondary | ICD-10-CM

## 2019-03-15 DIAGNOSIS — Z171 Estrogen receptor negative status [ER-]: Secondary | ICD-10-CM

## 2019-03-15 DIAGNOSIS — Z95828 Presence of other vascular implants and grafts: Secondary | ICD-10-CM

## 2019-03-15 LAB — CBC WITH DIFFERENTIAL (CANCER CENTER ONLY)
Abs Immature Granulocytes: 0.07 10*3/uL (ref 0.00–0.07)
Basophils Absolute: 0 10*3/uL (ref 0.0–0.1)
Basophils Relative: 0 %
Eosinophils Absolute: 0 10*3/uL (ref 0.0–0.5)
Eosinophils Relative: 0 %
HCT: 29.6 % — ABNORMAL LOW (ref 36.0–46.0)
Hemoglobin: 9.5 g/dL — ABNORMAL LOW (ref 12.0–15.0)
Immature Granulocytes: 1 %
Lymphocytes Relative: 14 %
Lymphs Abs: 1 10*3/uL (ref 0.7–4.0)
MCH: 32.8 pg (ref 26.0–34.0)
MCHC: 32.1 g/dL (ref 30.0–36.0)
MCV: 102.1 fL — ABNORMAL HIGH (ref 80.0–100.0)
Monocytes Absolute: 0.7 10*3/uL (ref 0.1–1.0)
Monocytes Relative: 10 %
Neutro Abs: 5.2 10*3/uL (ref 1.7–7.7)
Neutrophils Relative %: 75 %
Platelet Count: 160 10*3/uL (ref 150–400)
RBC: 2.9 MIL/uL — ABNORMAL LOW (ref 3.87–5.11)
RDW: 16.8 % — ABNORMAL HIGH (ref 11.5–15.5)
WBC Count: 7 10*3/uL (ref 4.0–10.5)
nRBC: 0.3 % — ABNORMAL HIGH (ref 0.0–0.2)

## 2019-03-15 LAB — CMP (CANCER CENTER ONLY)
ALT: 18 U/L (ref 0–44)
AST: 19 U/L (ref 15–41)
Albumin: 3.3 g/dL — ABNORMAL LOW (ref 3.5–5.0)
Alkaline Phosphatase: 145 U/L — ABNORMAL HIGH (ref 38–126)
Anion gap: 6 (ref 5–15)
BUN: 10 mg/dL (ref 6–20)
CO2: 29 mmol/L (ref 22–32)
Calcium: 8.7 mg/dL — ABNORMAL LOW (ref 8.9–10.3)
Chloride: 104 mmol/L (ref 98–111)
Creatinine: 0.73 mg/dL (ref 0.44–1.00)
GFR, Est AFR Am: >60 mL/min (ref >60)
GFR, Estimated: >60 mL/min (ref >60)
Glucose, Bld: 104 mg/dL — ABNORMAL HIGH (ref 70–99)
Potassium: 3.7 mmol/L (ref 3.5–5.1)
Sodium: 139 mmol/L (ref 135–145)
Total Bilirubin: 0.3 mg/dL (ref 0.3–1.2)
Total Protein: 7.1 g/dL (ref 6.5–8.1)

## 2019-03-15 MED ORDER — PROCHLORPERAZINE MALEATE 10 MG PO TABS
ORAL_TABLET | ORAL | Status: AC
  Filled 2019-03-15: qty 1

## 2019-03-15 MED ORDER — SODIUM CHLORIDE 0.9% FLUSH
10.0000 mL | INTRAVENOUS | Status: DC | PRN
  Administered 2019-03-15: 10 mL
  Filled 2019-03-15: qty 10

## 2019-03-15 MED ORDER — HEPARIN SOD (PORK) LOCK FLUSH 100 UNIT/ML IV SOLN
500.0000 [IU] | Freq: Once | INTRAVENOUS | Status: AC | PRN
  Administered 2019-03-15: 500 [IU]
  Filled 2019-03-15: qty 5

## 2019-03-15 MED ORDER — PROCHLORPERAZINE MALEATE 10 MG PO TABS
10.0000 mg | ORAL_TABLET | Freq: Once | ORAL | Status: AC
  Administered 2019-03-15: 10 mg via ORAL

## 2019-03-15 MED ORDER — SODIUM CHLORIDE 0.9 % IV SOLN
0.7000 mg/m2 | Freq: Once | INTRAVENOUS | Status: AC
  Administered 2019-03-15: 1.35 mg via INTRAVENOUS
  Filled 2019-03-15: qty 2.7

## 2019-03-15 MED ORDER — SODIUM CHLORIDE 0.9 % IV SOLN
Freq: Once | INTRAVENOUS | Status: AC
  Filled 2019-03-15: qty 250

## 2019-03-15 MED ORDER — SODIUM CHLORIDE 0.9% FLUSH
10.0000 mL | INTRAVENOUS | Status: DC | PRN
  Administered 2019-03-15: 10 mL via INTRAVENOUS
  Filled 2019-03-15: qty 10

## 2019-03-15 NOTE — Patient Instructions (Signed)
[facility] Discharge Instructions for Patients Receiving Chemotherapy  Today you received the following chemotherapy agents [place]  To help prevent nausea and vomiting after your treatment, we encourage you to take your nausea medication as directed If you develop nausea and vomiting that is not controlled by your nausea medication, call the clinic.   BELOW ARE SYMPTOMS THAT SHOULD BE REPORTED IMMEDIATELY:  *FEVER GREATER THAN 100.5 F  *CHILLS WITH OR WITHOUT FEVER  NAUSEA AND VOMITING THAT IS NOT CONTROLLED WITH YOUR NAUSEA MEDICATION  *UNUSUAL SHORTNESS OF BREATH  *UNUSUAL BRUISING OR BLEEDING  TENDERNESS IN MOUTH AND THROAT WITH OR WITHOUT PRESENCE OF ULCERS  *URINARY PROBLEMS  *BOWEL PROBLEMS  UNUSUAL RASH Items with * indicate a potential emergency and should be followed up as soon as possible.  Feel free to call the clinic should you have any questions or concerns. The clinic phone number is [phone].  Please show the CHEMO ALERT CARD at check-in to the Emergency Department and triage nurse.   

## 2019-03-15 NOTE — Patient Instructions (Signed)

## 2019-03-16 ENCOUNTER — Inpatient Hospital Stay: Payer: 59

## 2019-03-16 VITALS — BP 123/85 | HR 93 | Temp 98.2°F | Resp 18 | Ht 67.0 in

## 2019-03-16 DIAGNOSIS — C50512 Malignant neoplasm of lower-outer quadrant of left female breast: Secondary | ICD-10-CM | POA: Diagnosis not present

## 2019-03-16 DIAGNOSIS — Z7189 Other specified counseling: Secondary | ICD-10-CM

## 2019-03-16 DIAGNOSIS — C7951 Secondary malignant neoplasm of bone: Secondary | ICD-10-CM

## 2019-03-16 DIAGNOSIS — Z171 Estrogen receptor negative status [ER-]: Secondary | ICD-10-CM

## 2019-03-16 MED ORDER — PEGFILGRASTIM-BMEZ 6 MG/0.6ML ~~LOC~~ SOSY
6.0000 mg | PREFILLED_SYRINGE | Freq: Once | SUBCUTANEOUS | Status: AC
  Administered 2019-03-16: 6 mg via SUBCUTANEOUS

## 2019-03-16 NOTE — Patient Instructions (Signed)
Pegfilgrastim injection What is this medicine? PEGFILGRASTIM (PEG fil gra stim) is a long-acting granulocyte colony-stimulating factor that stimulates the growth of neutrophils, a type of white blood cell important in the body's fight against infection. It is used to reduce the incidence of fever and infection in patients with certain types of cancer who are receiving chemotherapy that affects the bone marrow, and to increase survival after being exposed to high doses of radiation. This medicine may be used for other purposes; ask your health care provider or pharmacist if you have questions. COMMON BRAND NAME(S): Fulphila, Neulasta, UDENYCA, Ziextenzo What should I tell my health care provider before I take this medicine? They need to know if you have any of these conditions:  kidney disease  latex allergy  ongoing radiation therapy  sickle cell disease  skin reactions to acrylic adhesives (On-Body Injector only)  an unusual or allergic reaction to pegfilgrastim, filgrastim, other medicines, foods, dyes, or preservatives  pregnant or trying to get pregnant  breast-feeding How should I use this medicine? This medicine is for injection under the skin. If you get this medicine at home, you will be taught how to prepare and give the pre-filled syringe or how to use the On-body Injector. Refer to the patient Instructions for Use for detailed instructions. Use exactly as directed. Tell your healthcare provider immediately if you suspect that the On-body Injector may not have performed as intended or if you suspect the use of the On-body Injector resulted in a missed or partial dose. It is important that you put your used needles and syringes in a special sharps container. Do not put them in a trash can. If you do not have a sharps container, call your pharmacist or healthcare provider to get one. Talk to your pediatrician regarding the use of this medicine in children. While this drug may be  prescribed for selected conditions, precautions do apply. Overdosage: If you think you have taken too much of this medicine contact a poison control center or emergency room at once. NOTE: This medicine is only for you. Do not share this medicine with others. What if I miss a dose? It is important not to miss your dose. Call your doctor or health care professional if you miss your dose. If you miss a dose due to an On-body Injector failure or leakage, a new dose should be administered as soon as possible using a single prefilled syringe for manual use. What may interact with this medicine? Interactions have not been studied. Give your health care provider a list of all the medicines, herbs, non-prescription drugs, or dietary supplements you use. Also tell them if you smoke, drink alcohol, or use illegal drugs. Some items may interact with your medicine. This list may not describe all possible interactions. Give your health care provider a list of all the medicines, herbs, non-prescription drugs, or dietary supplements you use. Also tell them if you smoke, drink alcohol, or use illegal drugs. Some items may interact with your medicine. What should I watch for while using this medicine? You may need blood work done while you are taking this medicine. If you are going to need a MRI, CT scan, or other procedure, tell your doctor that you are using this medicine (On-Body Injector only). What side effects may I notice from receiving this medicine? Side effects that you should report to your doctor or health care professional as soon as possible:  allergic reactions like skin rash, itching or hives, swelling of the   face, lips, or tongue  back pain  dizziness  fever  pain, redness, or irritation at site where injected  pinpoint red spots on the skin  red or dark-brown urine  shortness of breath or breathing problems  stomach or side pain, or pain at the  shoulder  swelling  tiredness  trouble passing urine or change in the amount of urine Side effects that usually do not require medical attention (report to your doctor or health care professional if they continue or are bothersome):  bone pain  muscle pain This list may not describe all possible side effects. Call your doctor for medical advice about side effects. You may report side effects to FDA at 1-800-FDA-1088. Where should I keep my medicine? Keep out of the reach of children. If you are using this medicine at home, you will be instructed on how to store it. Throw away any unused medicine after the expiration date on the label. NOTE: This sheet is a summary. It may not cover all possible information. If you have questions about this medicine, talk to your doctor, pharmacist, or health care provider.  2020 Elsevier/Gold Standard (2017-04-24 16:57:08)  Coronavirus (COVID-19) Are you at risk?  Are you at risk for the Coronavirus (COVID-19)?  To be considered HIGH RISK for Coronavirus (COVID-19), you have to meet the following criteria:  . Traveled to [place], [place], [place], [place] or [place]; or in the [place] to [place], [place], [place], or [place]; and have fever, cough, and shortness of breath within the last 2 weeks of travel OR . Been in close contact with a person diagnosed with COVID-19 within the last 2 weeks and have fever, cough, and shortness of breath . IF YOU DO NOT MEET THESE CRITERIA, YOU ARE CONSIDERED LOW RISK FOR COVID-19.  What to do if you are HIGH RISK for COVID-19?  Marland Kitchen If you are having a medical emergency, call 911. . Seek medical care right away. Before you go to a doctor's office, urgent care or emergency department, call ahead and tell them about your recent travel, contact with someone diagnosed with COVID-19, and your symptoms. You should receive instructions from your physician's office regarding next steps of care.  . When you arrive  at healthcare provider, tell the healthcare staff immediately you have returned from visiting [place], [place], [place], [place] or [place]; or traveled in the [place] to [place], [place], [place], or [place]; in the last two weeks or you have been in close contact with a person diagnosed with COVID-19 in the last 2 weeks.   . Tell the health care staff about your symptoms: fever, cough and shortness of breath. . After you have been seen by a medical provider, you will be either: o Tested for (COVID-19) and discharged home on quarantine except to seek medical care if symptoms worsen, and asked to  - Stay home and avoid contact with others until you get your results (4-5 days)  - Avoid travel on public transportation if possible (such as bus, train, or airplane) or o Sent to the Emergency Department by EMS for evaluation, COVID-19 testing, and possible admission depending on your condition and test results.  What to do if you are LOW RISK for COVID-19?  Reduce your risk of any infection by using the same precautions used for avoiding the common cold or flu:  Marland Kitchen Wash your hands often with soap and warm water for at least 20 seconds.  If soap  and water are not readily available, use an alcohol-based hand sanitizer with at least 60% alcohol.  . If coughing or sneezing, cover your mouth and nose by coughing or sneezing into the elbow areas of your shirt or coat, into a tissue or into your sleeve (not your hands). . Avoid shaking hands with others and consider head nods or verbal greetings only. . Avoid touching your eyes, nose, or mouth with unwashed hands.  . Avoid close contact with people who are sick. . Avoid places or events with large numbers of people in one location, like concerts or sporting events. . Carefully consider travel plans you have or are making. . If you are planning any travel outside or inside the [place], visit the CDC's Travelers' Health webpage for the latest health  notices. . If you have some symptoms but not all symptoms, continue to monitor at home and seek medical attention if your symptoms worsen. . If you are having a medical emergency, call 911.   [place] / e-Visit: [email or web address]         [facility]: [facility]: [id number]                   [facility]: [phone]

## 2019-03-27 NOTE — Progress Notes (Signed)
 Patient Care Team: Badger, [name], MD as PCP - General (Family Medicine) Stuart, [name], RN as Oncology Nurse Navigator Martini, [name], RN as Oncology Nurse Navigator Serpe, [name], NP as Nurse Practitioner (Hospice and Palliative Medicine) [name], MD as Consulting Physician (Hematology and Oncology) Causey, Lindsey Cornetto, NP as Nurse Practitioner (Hematology and Oncology)  DIAGNOSIS:    ICD-10-CM   1. Malignant neoplasm of lower-outer quadrant of left breast of female, estrogen receptor negative (HCC)  C50.512    Z17.1     SUMMARY OF ONCOLOGIC HISTORY: 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, estrogen receptor negative (HCC)  10/20/2016 Mammogram   Mammogram and ultrasound of the left breast revealed 1.7 cm mass at 4:00 position, 6:30 position 5 x 4 x 4 mm mass, 6:00 position 5 cm nipple 7 x 6 x 11 mm, left axillary lymph node with thickened cortex, T1c N1 stage II a AJCC 8   10/24/2016 Initial Diagnosis   Left breast biopsy 6:30 position 3 cm from nipple: IDC grade 2, DCIS, ER 0%, PR 0%, Ki-67 15% HER-2 positive ratio 2.1; 4:00 position 3 cm from nipple: IDC grade 2, DCIS, ER 0%, PR 0%, Ki-67 35%, HER-2 positive ratio 2.02; left axillary lymph node biopsy positive   11/04/2016 - 02/17/2017 Neo-Adjuvant Chemotherapy   TCH Perjeta 6 cycles followed by Herceptin + Perjeta maintenance to be completed September 2019   11/30/2016 Genetic Testing   Negative genetic testing on the common hereditary cancer panel.  The Hereditary Gene Panel offered by [facility] includes sequencing and/or deletion duplication testing of the following 47 genes: APC, ATM, AXIN2, BARD1, BMPR1A, BRCA1, BRCA2, BRIP1, CDH1, CDK4, CDKN2A (p14ARF), CDKN2A (p16INK4a), CHEK2, CTNNA1, DICER1, EPCAM (Deletion/duplication testing only), GREM1 (promoter region deletion/duplication testing only), KIT, MEN1, MLH1, MSH2, MSH3, MSH6, MUTYH, NBN, NF1, NHTL1, PALB2, PDGFRA,  PMS2, POLD1, POLE, PTEN, RAD50, RAD51C, RAD51D, SDHB, SDHC, SDHD, SMAD4, SMARCA4. STK11, TP53, TSC1, TSC2, and VHL.  The following genes were evaluated for sequence changes only: SDHA and HOXB13 c.251G>A variant only. The report date is November 30, 2016.    03/27/2017 Surgery   Bilateral mastectomies: Left mastectomy: IDC grade 2 0.9 cm, nodes negative, right mastectomy benign, ER 0%, PR 0%, HER-2 positive ratio 2.6   05/08/2017 - 06/09/2017 Radiation Therapy   Adjuvant radiation therapy   10/23/2017 Miscellaneous   Neratinib discontinued after 4 weeks for severe diarrhea   07/25/2018 Relapse/Recurrence   MRI of right elbow showed bone lesion consistent with malignancy. PET scan showed bilateral pulmonary nodules and several lytic bone lesions compatible with metastatic disease. Brain MRI on 08/02/18 showed no evidence of metastatic disease.   08/02/2018 PET scan   Bilateral hypermetabolic lung nodules, LUL 1.3 cm with SUV 3.88, lingular nodule 1.4 cm SUV 3.7, central lingular nodule 1.2 cm SUV 9.76, right middle lobe nodule 1.5 cm SUV 9.9, lytic bone metastases inferior pubic ramus, sacrum, T12, right 11th rib.   08/08/2018 Procedure   Lung biopsy: metastatic carcinoma, HER-2 negative (0), ER/PR negative.   08/10/2018 -  Radiation Therapy   Palliative radiatio to the right humerus along the medial condyle   08/24/2018 - 09/05/2018 Radiation Therapy   Palliative radiation to the right 11th rib and right elbow   09/26/2018 - 12/04/2018 Chemotherapy   Carboplatin atezolizumab at [facility] with Dr. Lisa Carey on [facility] 043 clinical trial stopped because of new T5 metastases (toxicities included myopathy required prednisone, immune mediated thyroiditis), right upper   extremity DVT on apixaban   12/14/2018 -  Chemotherapy   The patient had pegfilgrastim-bmez (ZIEXTENZO) injection 6 mg, 6 mg, Subcutaneous,  Once, 2 of 2 cycles Administration: 6 mg (03/02/2019), 6 mg (02/16/2019), 6 mg (03/16/2019) eriBULin  mesylate (HALAVEN) 2.7 mg in sodium chloride 0.9 % 100 mL chemo infusion, 1.42 mg/m2 = 2.65 mg, Intravenous,  Once, 4 of 4 cycles Dose modification: 1.1 mg/m2 (original dose 1.4 mg/m2, Cycle 2, Reason: Dose not tolerated, Comment: neutropenic fever), 0.7 mg/m2 (original dose 1.4 mg/m2, Cycle 4, Reason: Provider Judgment), 0.7 mg/m2 (original dose 1.4 mg/m2, Cycle 4, Reason: Provider Judgment) Administration: 2.7 mg (12/14/2018), 2.7 mg (12/21/2018), 2 mg (01/03/2019), 2 mg (01/10/2019), 1.35 mg (02/15/2019), 1.35 mg (03/01/2019), 1.35 mg (03/15/2019)  for chemotherapy treatment.    12/17/2018 -  Radiation Therapy   Palliative radiation to sternal, sacral & pelvic lesions and SRS for T3 and C7-T1 lesions.   Bone metastases ([place])  08/07/2018 Initial Diagnosis   Bone metastases ([place])   12/14/2018 -  Chemotherapy   The patient had pertuzumab (PERJETA) 420 mg in sodium chloride 0.9 % 250 mL chemo infusion, 420 mg (100 % of original dose 420 mg), Intravenous, Once, 4 of 6 cycles Dose modification: 420 mg (original dose 420 mg, Cycle 1, Reason: Provider Judgment) Administration: 420 mg (12/14/2018), 420 mg (01/10/2019), 420 mg (01/30/2019), 420 mg (03/01/2019) trastuzumab-dkst (OGIVRI) 600 mg in sodium chloride 0.9 % 250 mL chemo infusion, 609 mg, Intravenous,  Once, 4 of 6 cycles Administration: 600 mg (12/14/2018), 450 mg (01/10/2019), 450 mg (01/30/2019), 450 mg (03/01/2019)  for chemotherapy treatment.    12/17/2018 -  Radiation Therapy   Palliative radiation to sternal, sacral & pelvic lesions and SRS for T3 and C7-T1 lesions.     CHIEF COMPLIANT: Follow-up of metastatic breast cancer on[place] with Herceptin and Perjeta  INTERVAL HISTORY: Laura Anthony is a 44 y.o. with above-mentioned history of metastatic breast cancerwith metastasestothe lungs and bone. She is currently on treatment with[place] with Herceptin and Perjeta. Echo on 03/04/19 showed an ejection fraction of 60-65%.She  presents to the clinic todayfor treatment. Patient had injections to her back with Dr. Lovenia Shuck and since then she has been a new person completely.  Her pain has nearly completely resolved.  She is just taking methadone twice a day.  There is discomfort in the left lower rib cage area.  It is not severe enough at this time.  She rates it as 4 out of 10. She does have constipation.  However when she gets Herceptin Perjeta she does have episodes of loose stool.  ALLERGIES:  is allergic to sumatriptan; statins; and tape.  MEDICATIONS:  Current Outpatient Medications  Medication Sig Dispense Refill  . albuterol (VENTOLIN HFA) 108 (90 Base) MCG/ACT inhaler Inhale 2 puffs into the lungs every 6 (six) hours as needed for wheezing or shortness of breath. 18 g 2  . ELIQUIS 5 MG TABS tablet Take 1 tablet (5 mg total) by mouth 2 (two) times daily. 60 tablet 3  . FLUoxetine (PROZAC) 20 MG capsule Take 1 capsule (20 mg total) by mouth daily. 90 capsule 1  . FLUoxetine (PROZAC) 40 MG capsule Take 1 capsule (40 mg total) by mouth daily. 90 capsule 1  . fluticasone (FLOVENT HFA) 110 MCG/ACT inhaler Inhale 2 puffs into the lungs 2 (two) times daily. 1 Inhaler 2  . gabapentin (NEURONTIN) 300 MG capsule Take 2 capsules (600 mg total) by mouth at bedtime. 60 capsule 5  . levothyroxine (  SYNTHROID) 88 MCG tablet Take 1 tablet (88 mcg total) by mouth daily before breakfast. 90 tablet 0  . lidocaine-prilocaine (EMLA) cream APPLY 1 APPLICATION TOPICALLY AS NEEDED. 30 g 0  . lisdexamfetamine (VYVANSE) 30 MG capsule Take 30 mg by mouth every morning.     . methadone (DOLOPHINE) 10 MG tablet Take 1 tablet (10 mg total) by mouth every 6 (six) hours as needed. 120 tablet 0  . naloxone (NARCAN) 4 MG/0.1ML LIQD nasal spray kit Spray once in one nostril prn overdose, may repeat x 1 1 each 1  . ondansetron (ZOFRAN) 8 MG tablet Take 1 tablet (8 mg total) by mouth 2 (two) times daily as needed (Nausea or vomiting). 30 tablet 1  .  pantoprazole (PROTONIX) 40 MG tablet TAKE 1 TABLET BY MOUTH TWICE A DAY 60 tablet 0  . polyethylene glycol (MIRALAX / GLYCOLAX) 17 g packet Take 17 g by mouth daily as needed for mild constipation or moderate constipation. 14 each 0  . prochlorperazine (COMPAZINE) 10 MG tablet Take 1 tablet (10 mg total) by mouth every 6 (six) hours as needed (Nausea or vomiting). 30 tablet 1  . rizatriptan (MAXALT) 10 MG tablet Take 10 mg by mouth daily as needed for migraine (and may repeat once in 2 hours, if no relief).     Marland Kitchen senna-docusate (SENOKOT-S) 8.6-50 MG tablet Take 1 tablet by mouth 2 (two) times daily. (Patient taking differently: Take 1 tablet by mouth 2 (two) times daily as needed for moderate constipation. ) 60 tablet 0  . traZODone (DESYREL) 50 MG tablet Take 1 tablet (50 mg total) by mouth at bedtime as needed for sleep. (Patient taking differently: Take 50 mg by mouth at bedtime. ) 90 tablet 1   No current facility-administered medications for this visit.   Facility-Administered Medications Ordered in Other Visits  Medication Dose Route Frequency Provider Last Rate Last Admin  . sodium chloride flush (NS) 0.9 % injection 10 mL  10 mL Intravenous PRN Nicholas Lose, MD   10 mL at 03/01/19 1400    PHYSICAL EXAMINATION: ECOG PERFORMANCE STATUS: 1 - Symptomatic but completely ambulatory  Vitals:   03/28/19 1041  BP: 117/76  Pulse: 90  Resp: 18  Temp: 98 F (36.7 C)  SpO2: 97%   Filed Weights   03/28/19 1041  Weight: 165 lb 11.2 oz (75.2 kg)    LABORATORY DATA:  I have reviewed the data as listed CMP Latest Ref Rng & Units 03/15/2019 03/06/2019 03/01/2019  Glucose 70 - 99 mg/dL 104(H) 143(H) 108(H)  BUN 6 - 20 mg/dL 10 5(L) 9  Creatinine 0.44 - 1.00 mg/dL 0.73 0.84 0.77  Sodium 135 - 145 mmol/L 139 139 138  Potassium 3.5 - 5.1 mmol/L 3.7 3.8 3.8  Chloride 98 - 111 mmol/L 104 103 104  CO2 22 - 32 mmol/L '29 27 27  '$ Calcium 8.9 - 10.3 mg/dL 8.7(L) 8.9 8.6(L)  Total Protein 6.5 - 8.1  g/dL 7.1 7.4 7.3  Total Bilirubin 0.3 - 1.2 mg/dL 0.3 0.5 0.3  Alkaline Phos 38 - 126 U/L 145(H) 186(H) 159(H)  AST 15 - 41 U/L '19 25 19  '$ ALT 0 - 44 U/L '18 14 13    '$ Lab Results  Component Value Date   WBC 10.0 03/28/2019   HGB 9.6 (L) 03/28/2019   HCT 31.5 (L) 03/28/2019   MCV 106.1 (H) 03/28/2019   PLT 159 03/28/2019   NEUTROABS 8.4 (H) 03/28/2019    ASSESSMENT & PLAN:  Malignant  neoplasm of lower-outer quadrant of left breast of female, estrogen receptor negative ([place]) Mammogram and ultrasound of the left breast revealed 1.7 cm mass at 4:00 position, 6:30 position 5 x 4 x 4 mm mass, 6:00 position 5 cm nipple 7 x 6 x 11 mm, left axillary lymph node with thickened cortex, T1c N1 stage II a AJCC 8  10/24/2016: Left breast biopsy 6:30 position 3 cm from nipple: IDC grade 2, DCIS, ER 0%, PR 0%, Ki-67 15% HER-2 positive ratio 2.1; 4:00 position 3 cm from nipple: IDC grade 2, DCIS, ER 0%, PR 0%, Ki-67 35%, HER-2 positive ratio 2.02 Lymph node biopsy positive  Treatment Summary: 1. Neoadjuvant chemotherapy with TCHPcompleted 02/17/2017 this would be followed by Herceptinand Perjetamaintenance for 1 yearcompleted September 2019 2.Bilateral mastectomies 03/28/2016:Bilateral mastectomies: Left mastectomy: IDC grade 2 0.9 cm, nodes negative, right mastectomy benign, ER 0%, PR 0%, HER-2 positive ratio 2.6 3.Adjuvant radiation4/08/2017 to 06/09/2017 4.Neratinib started 10/12/2017 discontinued due to diarrhea 5. Elbow fracture: Due to metastatic disease, palliative radiation therapy 6. Carboplatin atezolizumab at [facility] on a clinical trial [facility] 043 stopped for progression 12/04/2018 ---------------------------------------------------------------------------------------------------------------- Lung nodule biopsy: Metastatic breast cancer triple negative  CT CAP 12/03/2018: Progression of lung nodules and bone metastases done at [facility] CTA 01/2019: improvement in lung  nodules Current treatment:Eribulin/Herceptin/Perjeta.  Chemo toxicities: 1.  Neutropenic fevers: Resolved 2.  Cytopenias: Now on every 2-week eribulin.  Herceptin and Perjeta being given once a month.  Dose of eribulin also reduced to 0.7 mg meter square.  3. Thrombocytopenia: platelets today are 159 4.  Constipation alternating with loose stools.  CT 01/20/2019: Improvement in the disease. I recommend obtaining another CT scan in 1 month  F/U with each chemo    No orders of the defined types were placed in this encounter.  The patient has a good understanding of the overall plan. she agrees with it. she will call with any problems that may develop before the next visit here.  Total time spent: 30 mins including face to face time and time spent for planning, charting and coordination of care  Nicholas Lose, MD 03/28/2019  I, Cloyde Reams Dorshimer, am acting as scribe for Dr. Nicholas Lose.  I have reviewed the above documentation for accuracy and completeness, and I agree with the above.

## 2019-03-28 ENCOUNTER — Inpatient Hospital Stay: Payer: 59

## 2019-03-28 ENCOUNTER — Encounter: Payer: Self-pay | Admitting: *Deleted

## 2019-03-28 ENCOUNTER — Inpatient Hospital Stay (HOSPITAL_BASED_OUTPATIENT_CLINIC_OR_DEPARTMENT_OTHER): Payer: 59 | Admitting: Hematology and Oncology

## 2019-03-28 ENCOUNTER — Other Ambulatory Visit: Payer: Self-pay

## 2019-03-28 DIAGNOSIS — C50512 Malignant neoplasm of lower-outer quadrant of left female breast: Secondary | ICD-10-CM

## 2019-03-28 DIAGNOSIS — G893 Neoplasm related pain (acute) (chronic): Secondary | ICD-10-CM

## 2019-03-28 DIAGNOSIS — Z171 Estrogen receptor negative status [ER-]: Secondary | ICD-10-CM

## 2019-03-28 DIAGNOSIS — Z7189 Other specified counseling: Secondary | ICD-10-CM

## 2019-03-28 DIAGNOSIS — C7951 Secondary malignant neoplasm of bone: Secondary | ICD-10-CM

## 2019-03-28 DIAGNOSIS — Z95828 Presence of other vascular implants and grafts: Secondary | ICD-10-CM

## 2019-03-28 LAB — CBC WITH DIFFERENTIAL (CANCER CENTER ONLY)
Abs Immature Granulocytes: 0.07 10*3/uL (ref 0.00–0.07)
Basophils Absolute: 0 10*3/uL (ref 0.0–0.1)
Basophils Relative: 0 %
Eosinophils Absolute: 0 10*3/uL (ref 0.0–0.5)
Eosinophils Relative: 0 %
HCT: 31.5 % — ABNORMAL LOW (ref 36.0–46.0)
Hemoglobin: 9.6 g/dL — ABNORMAL LOW (ref 12.0–15.0)
Immature Granulocytes: 1 %
Lymphocytes Relative: 9 %
Lymphs Abs: 0.9 10*3/uL (ref 0.7–4.0)
MCH: 32.3 pg (ref 26.0–34.0)
MCHC: 30.5 g/dL (ref 30.0–36.0)
MCV: 106.1 fL — ABNORMAL HIGH (ref 80.0–100.0)
Monocytes Absolute: 0.6 10*3/uL (ref 0.1–1.0)
Monocytes Relative: 6 %
Neutro Abs: 8.4 10*3/uL — ABNORMAL HIGH (ref 1.7–7.7)
Neutrophils Relative %: 84 %
Platelet Count: 159 10*3/uL (ref 150–400)
RBC: 2.97 MIL/uL — ABNORMAL LOW (ref 3.87–5.11)
RDW: 17.5 % — ABNORMAL HIGH (ref 11.5–15.5)
WBC Count: 10 10*3/uL (ref 4.0–10.5)
nRBC: 0 % (ref 0.0–0.2)

## 2019-03-28 LAB — CMP (CANCER CENTER ONLY)
ALT: 16 U/L (ref 0–44)
AST: 16 U/L (ref 15–41)
Albumin: 3.1 g/dL — ABNORMAL LOW (ref 3.5–5.0)
Alkaline Phosphatase: 183 U/L — ABNORMAL HIGH (ref 38–126)
Anion gap: 9 (ref 5–15)
BUN: 12 mg/dL (ref 6–20)
CO2: 27 mmol/L (ref 22–32)
Calcium: 8.5 mg/dL — ABNORMAL LOW (ref 8.9–10.3)
Chloride: 105 mmol/L (ref 98–111)
Creatinine: 0.76 mg/dL (ref 0.44–1.00)
GFR, Est AFR Am: >60 mL/min (ref >60)
GFR, Estimated: >60 mL/min (ref >60)
Glucose, Bld: 108 mg/dL — ABNORMAL HIGH (ref 70–99)
Potassium: 3.6 mmol/L (ref 3.5–5.1)
Sodium: 141 mmol/L (ref 135–145)
Total Bilirubin: 0.3 mg/dL (ref 0.3–1.2)
Total Protein: 6.9 g/dL (ref 6.5–8.1)

## 2019-03-28 MED ORDER — SODIUM CHLORIDE 0.9 % IV SOLN
0.7000 mg/m2 | Freq: Once | INTRAVENOUS | Status: AC
  Administered 2019-03-28: 1.35 mg via INTRAVENOUS
  Filled 2019-03-28: qty 2.7

## 2019-03-28 MED ORDER — DENOSUMAB 120 MG/1.7ML ~~LOC~~ SOLN
120.0000 mg | Freq: Once | SUBCUTANEOUS | Status: AC
  Administered 2019-03-28: 120 mg via SUBCUTANEOUS

## 2019-03-28 MED ORDER — DIPHENHYDRAMINE HCL 25 MG PO CAPS
ORAL_CAPSULE | ORAL | Status: AC
  Filled 2019-03-28: qty 1

## 2019-03-28 MED ORDER — TRASTUZUMAB-DKST CHEMO 150 MG IV SOLR
450.0000 mg | Freq: Once | INTRAVENOUS | Status: AC
  Administered 2019-03-28: 450 mg via INTRAVENOUS
  Filled 2019-03-28: qty 21.43

## 2019-03-28 MED ORDER — PROCHLORPERAZINE MALEATE 10 MG PO TABS
10.0000 mg | ORAL_TABLET | Freq: Once | ORAL | Status: AC
  Administered 2019-03-28: 10 mg via ORAL

## 2019-03-28 MED ORDER — SODIUM CHLORIDE 0.9 % IV SOLN
Freq: Once | INTRAVENOUS | Status: AC
  Filled 2019-03-28: qty 250

## 2019-03-28 MED ORDER — SODIUM CHLORIDE 0.9% FLUSH
10.0000 mL | INTRAVENOUS | Status: DC | PRN
  Administered 2019-03-28: 10 mL
  Filled 2019-03-28: qty 10

## 2019-03-28 MED ORDER — SODIUM CHLORIDE 0.9 % IV SOLN
420.0000 mg | Freq: Once | INTRAVENOUS | Status: AC
  Administered 2019-03-28: 420 mg via INTRAVENOUS
  Filled 2019-03-28: qty 14

## 2019-03-28 MED ORDER — PROCHLORPERAZINE MALEATE 10 MG PO TABS
ORAL_TABLET | ORAL | Status: AC
  Filled 2019-03-28: qty 1

## 2019-03-28 MED ORDER — SODIUM CHLORIDE 0.9% FLUSH
10.0000 mL | INTRAVENOUS | Status: DC | PRN
  Administered 2019-03-28: 10 mL via INTRAVENOUS
  Filled 2019-03-28: qty 10

## 2019-03-28 MED ORDER — DIPHENHYDRAMINE HCL 25 MG PO CAPS
50.0000 mg | ORAL_CAPSULE | Freq: Once | ORAL | Status: AC
  Administered 2019-03-28: 25 mg via ORAL

## 2019-03-28 MED ORDER — DENOSUMAB 120 MG/1.7ML ~~LOC~~ SOLN
SUBCUTANEOUS | Status: AC
  Filled 2019-03-28: qty 1.7

## 2019-03-28 MED ORDER — OXYCODONE-ACETAMINOPHEN 5-325 MG PO TABS
2.0000 | ORAL_TABLET | Freq: Once | ORAL | Status: AC
  Administered 2019-03-28: 2 via ORAL

## 2019-03-28 MED ORDER — ACETAMINOPHEN 325 MG PO TABS: 650.0000 mg | ORAL_TABLET | Freq: Once | ORAL | Status: DC

## 2019-03-28 MED ORDER — HEPARIN SOD (PORK) LOCK FLUSH 100 UNIT/ML IV SOLN
500.0000 [IU] | Freq: Once | INTRAVENOUS | Status: AC | PRN
  Administered 2019-03-28: 500 [IU]
  Filled 2019-03-28: qty 5

## 2019-03-28 MED ORDER — OXYCODONE-ACETAMINOPHEN 5-325 MG PO TABS
ORAL_TABLET | ORAL | Status: AC
  Filled 2019-03-28: qty 2

## 2019-03-28 NOTE — Assessment & Plan Note (Signed)
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 Neoadjuvant chemotherapy with TCHPcompleted 02/17/2017 this would be followed by Herceptinand Perjetamaintenance for 1 yearcompleted September 2019 2.Bilateral mastectomies 03/28/2016:Bilateral mastectomies: Left mastectomy: IDC grade 2 0.9 cm, nodes negative, right mastectomy benign, ER 0%, PR 0%, HER-2 positive ratio 2.6 3.Adjuvant radiation4/08/2017 to 06/09/2017 4.Neratinib started 10/12/2017 discontinued due to diarrhea 5. Elbow fracture: Due to metastatic disease, palliative radiation therapy 6. Carboplatin atezolizumab at [facility] on a clinical trial [facility] 043 stopped for progression 12/04/2018 ---------------------------------------------------------------------------------------------------------------- Lung nodule biopsy: Metastatic breast cancer triple negative  CT CAP 12/03/2018: Progression of lung nodules and bone metastases done at [facility] CTA 01/2019: improvement in lung nodules Current treatment:Eribulin/Herceptin/Perjeta.  Chemo toxicities: 1.  Neutropenic fevers: Resolved 2.  Cytopenias: Now on every 2-week eribulin.  Herceptin and Perjeta being given once a month.  Dose of eribulin also reduced to 0.7 mg meter square.  3. Thrombocytopenia: platelets today are   CT 01/20/2019: Improvement in the disease. I recommend obtaining another CT scan in 1 month  F/U with each chemo

## 2019-03-28 NOTE — Patient Instructions (Signed)
[facility] Discharge Instructions for Patients Receiving Chemotherapy  Today you received the following chemotherapy agents: Halaven, trastuzumab, perjerta   To help prevent nausea and vomiting after your treatment, we encourage you to take your nausea medication as directed.    If you develop nausea and vomiting that is not controlled by your nausea medication, call the clinic.   BELOW ARE SYMPTOMS THAT SHOULD BE REPORTED IMMEDIATELY:  *FEVER GREATER THAN 100.5 F  *CHILLS WITH OR WITHOUT FEVER  NAUSEA AND VOMITING THAT IS NOT CONTROLLED WITH YOUR NAUSEA MEDICATION  *UNUSUAL SHORTNESS OF BREATH  *UNUSUAL BRUISING OR BLEEDING  TENDERNESS IN MOUTH AND THROAT WITH OR WITHOUT PRESENCE OF ULCERS  *URINARY PROBLEMS  *BOWEL PROBLEMS  UNUSUAL RASH Items with * indicate a potential emergency and should be followed up as soon as possible.  Feel free to call the clinic should you have any questions or concerns. The clinic phone number is [phone].  Please show the [place] at check-in to the Emergency Department and triage nurse.

## 2019-03-29 ENCOUNTER — Telehealth: Payer: Self-pay | Admitting: Hematology and Oncology

## 2019-03-29 ENCOUNTER — Inpatient Hospital Stay: Payer: 59

## 2019-03-29 ENCOUNTER — Other Ambulatory Visit: Payer: 59

## 2019-03-29 ENCOUNTER — Ambulatory Visit: Payer: 59

## 2019-03-29 ENCOUNTER — Other Ambulatory Visit: Payer: Self-pay

## 2019-03-29 ENCOUNTER — Ambulatory Visit: Payer: 59 | Admitting: Hematology and Oncology

## 2019-03-29 VITALS — BP 128/72 | HR 72 | Temp 98.2°F | Resp 18

## 2019-03-29 DIAGNOSIS — C50512 Malignant neoplasm of lower-outer quadrant of left female breast: Secondary | ICD-10-CM

## 2019-03-29 DIAGNOSIS — C7951 Secondary malignant neoplasm of bone: Secondary | ICD-10-CM

## 2019-03-29 DIAGNOSIS — Z171 Estrogen receptor negative status [ER-]: Secondary | ICD-10-CM

## 2019-03-29 DIAGNOSIS — Z7189 Other specified counseling: Secondary | ICD-10-CM

## 2019-03-29 MED ORDER — PEGFILGRASTIM-BMEZ 6 MG/0.6ML ~~LOC~~ SOSY
6.0000 mg | PREFILLED_SYRINGE | Freq: Once | SUBCUTANEOUS | Status: AC
  Administered 2019-03-29: 6 mg via SUBCUTANEOUS
  Filled 2019-03-29: qty 0.6

## 2019-03-29 NOTE — Telephone Encounter (Signed)
I talk with patient regarding schedule  

## 2019-04-01 ENCOUNTER — Inpatient Hospital Stay: Payer: 59 | Attending: Hematology and Oncology | Admitting: Hematology and Oncology

## 2019-04-01 ENCOUNTER — Other Ambulatory Visit: Payer: Self-pay

## 2019-04-01 ENCOUNTER — Ambulatory Visit (HOSPITAL_COMMUNITY)
Admission: RE | Admit: 2019-04-01 | Discharge: 2019-04-01 | Disposition: A | Discharge status: Home / Self Care | Payer: 59 | Source: Ambulatory Visit | Attending: Hematology and Oncology | Admitting: Hematology and Oncology

## 2019-04-01 VITALS — BP 119/84 | HR 95 | Temp 97.8°F | Resp 17 | Ht 67.0 in | Wt 163.8 lb

## 2019-04-01 DIAGNOSIS — C50512 Malignant neoplasm of lower-outer quadrant of left female breast: Secondary | ICD-10-CM | POA: Diagnosis not present

## 2019-04-01 DIAGNOSIS — Z7689 Persons encountering health services in other specified circumstances: Secondary | ICD-10-CM | POA: Insufficient documentation

## 2019-04-01 DIAGNOSIS — Z5111 Encounter for antineoplastic chemotherapy: Secondary | ICD-10-CM | POA: Diagnosis not present

## 2019-04-01 DIAGNOSIS — Z9013 Acquired absence of bilateral breasts and nipples: Secondary | ICD-10-CM | POA: Insufficient documentation

## 2019-04-01 DIAGNOSIS — Z923 Personal history of irradiation: Secondary | ICD-10-CM | POA: Diagnosis not present

## 2019-04-01 DIAGNOSIS — Z79899 Other long term (current) drug therapy: Secondary | ICD-10-CM | POA: Insufficient documentation

## 2019-04-01 DIAGNOSIS — Z171 Estrogen receptor negative status [ER-]: Secondary | ICD-10-CM | POA: Diagnosis not present

## 2019-04-01 DIAGNOSIS — C7951 Secondary malignant neoplasm of bone: Secondary | ICD-10-CM | POA: Diagnosis not present

## 2019-04-01 DIAGNOSIS — Z9221 Personal history of antineoplastic chemotherapy: Secondary | ICD-10-CM | POA: Insufficient documentation

## 2019-04-01 DIAGNOSIS — G893 Neoplasm related pain (acute) (chronic): Secondary | ICD-10-CM | POA: Diagnosis not present

## 2019-04-01 DIAGNOSIS — R071 Chest pain on breathing: Secondary | ICD-10-CM

## 2019-04-01 DIAGNOSIS — Z7189 Other specified counseling: Secondary | ICD-10-CM | POA: Diagnosis not present

## 2019-04-01 MED ORDER — SODIUM CHLORIDE (PF) 0.9 % IJ SOLN
INTRAMUSCULAR | Status: AC
  Filled 2019-04-01: qty 50

## 2019-04-01 MED ORDER — LIDOCAINE-PRILOCAINE 2.5-2.5 % EX CREA: TOPICAL_CREAM | CUTANEOUS | 3 refills | Status: DC

## 2019-04-01 MED ORDER — ONDANSETRON HCL 8 MG PO TABS: 8.0000 mg | ORAL_TABLET | Freq: Two times a day (BID) | ORAL | 1 refills | Status: DC | PRN

## 2019-04-01 MED ORDER — HYDROMORPHONE HCL 4 MG/ML IJ SOLN
2.0000 mg | Freq: Once | INTRAMUSCULAR | Status: DC
  Filled 2019-04-01: qty 1

## 2019-04-01 MED ORDER — PROCHLORPERAZINE MALEATE 10 MG PO TABS: 10.0000 mg | ORAL_TABLET | Freq: Four times a day (QID) | ORAL | 1 refills | Status: DC | PRN

## 2019-04-01 MED ORDER — OXYCODONE-ACETAMINOPHEN 10-325 MG PO TABS: 1.0000 | ORAL_TABLET | ORAL | 0 refills | Status: DC | PRN

## 2019-04-01 MED ORDER — HEPARIN SOD (PORK) LOCK FLUSH 100 UNIT/ML IV SOLN
INTRAVENOUS | Status: AC
  Filled 2019-04-01: qty 5

## 2019-04-01 MED ORDER — LACTULOSE 20 GM/30ML PO SOLN: 20.0000 g | Freq: Every day | ORAL | 1 refills | Status: DC | PRN

## 2019-04-01 MED ORDER — HYDROMORPHONE HCL 2 MG/ML IJ SOLN
2.0000 mg | Freq: Once | INTRAMUSCULAR | Status: AC
  Administered 2019-04-01: 2 mg via SUBCUTANEOUS

## 2019-04-01 MED ORDER — IOHEXOL 350 MG/ML SOLN
100.0000 mL | Freq: Once | INTRAVENOUS | Status: AC | PRN
  Administered 2019-04-01: 100 mL via INTRAVENOUS

## 2019-04-01 MED ORDER — HYDROMORPHONE HCL 2 MG/ML IJ SOLN
INTRAMUSCULAR | Status: AC
  Filled 2019-04-01: qty 1

## 2019-04-01 MED ORDER — LOPERAMIDE HCL 2 MG PO TABS: ORAL_TABLET | ORAL | 1 refills | Status: DC

## 2019-04-01 MED ORDER — HYDROMORPHONE HCL 2 MG/ML IJ SOLN
2.0000 mg | Freq: Once | INTRAMUSCULAR | Status: AC
  Administered 2019-04-01: 2 mg via SUBCUTANEOUS
  Filled 2019-04-01: qty 1

## 2019-04-01 MED ORDER — DEXAMETHASONE 4 MG PO TABS: ORAL_TABLET | ORAL | 1 refills | Status: DC

## 2019-04-01 NOTE — Assessment & Plan Note (Signed)
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 Neoadjuvant chemotherapy with TCHPcompleted 02/17/2017 this would be followed by Herceptinand Perjetamaintenance for 1 yearcompleted September 2019 2.Bilateral mastectomies 03/28/2016:Bilateral mastectomies: Left mastectomy: IDC grade 2 0.9 cm, nodes negative, right mastectomy benign, ER 0%, PR 0%, HER-2 positive ratio 2.6 3.Adjuvant radiation4/08/2017 to 06/09/2017 4.Neratinib started 10/12/2017 discontinued due to diarrhea 5. Elbow fracture: Due to metastatic disease, palliative radiation therapy 6. Carboplatin atezolizumab at [facility] on a clinical trial [facility] 043 stopped for progression 12/04/2018 ---------------------------------------------------------------------------------------------------------------- Lung nodule biopsy: Metastatic breast cancer triple negative  CT CAP 12/03/2018: Progression of lung nodules and bone metastases done at [facility] CTA 01/2019: improvement in lung nodules Current treatment:Eribulin/Herceptin/Perjeta.  Severe and worsening pain: It appears that the effect of the nerve block have worn off. I recommend that she see Dr. Maryjean Ka once again. Over the weekend I recommended doubling down on the dosage of the pain medication.

## 2019-04-01 NOTE — Progress Notes (Signed)
DISCONTINUE OFF PATHWAY REGIMEN - Breast   [id number]:Eribulin 1.4 mg/m2 D1, 8 q21 Days:   A cycle is every 21 days:     Eribulin mesylate   **Always confirm dose/schedule in your pharmacy ordering system**  [id number]:Pertuzumab 420 mg IV + Trastuzumab 6 mg/kg IV q21 Days:   A cycle is every 21 days:     Trastuzumab-xxxx      Pertuzumab   **Always confirm dose/schedule in your pharmacy ordering system**  REASON: Disease Progression PRIOR TREATMENT: Off Pathway: Eribulin 1.4 mg/m2 D1, 8 q21 Days followed by Pertuzumab 420 mg IV + Trastuzumab 6 mg/kg IV q21 Days TREATMENT RESPONSE: Stable Disease (SD)  START ON PATHWAY REGIMEN - Breast     A cycle is every 21 days:     Sacituzumab govitecan-hziy   **Always confirm dose/schedule in your pharmacy ordering system**  Patient Characteristics: Distant Metastases or Locoregional Recurrent Disease - Unresected or Locally Advanced Unresectable Disease Progressing after Neoadjuvant and Local Therapies, HER2 Negative/Unknown/Equivocal, ER Negative/Unknown, Chemotherapy, Third Line and Beyond, Prior  or Contraindicated Anthracycline and Prior or Contraindicated Eribulin Therapeutic Status: Distant Metastases BRCA Mutation Status: Absent ER Status: Negative (-) HER2 Status: Negative (-) PR Status: Negative (-) Line of Therapy: Third Line and Beyond Intent of Therapy: Non-Curative / Palliative Intent, Discussed with Patient

## 2019-04-01 NOTE — Progress Notes (Addendum)
Patient Care Team: [name], MD as PCP - General (Family Medicine) Mauro Kaufmann, RN as Oncology Nurse Navigator Rockwell Germany, RN as Oncology Nurse Navigator Serpe, Aletha Halim, NP as Nurse Practitioner (Hospice and Palliative Medicine) Nicholas Lose, MD as Consulting Physician (Hematology and Oncology) Delice Bison Charlestine Massed, NP as Nurse Practitioner (Hematology and Oncology)  DIAGNOSIS:  Encounter Diagnoses  Name Primary?  . Malignant neoplasm of lower-outer quadrant of left breast of female, estrogen receptor negative ([place])   . Chest pain on breathing     SUMMARY OF ONCOLOGIC HISTORY: 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, estrogen receptor negative ([place])  10/20/2016 Mammogram   Mammogram and ultrasound of the left breast revealed 1.7 cm mass at 4:00 position, 6:30 position 5 x 4 x 4 mm mass, 6:00 position 5 cm nipple 7 x 6 x 11 mm, left axillary lymph node with thickened cortex, T1c N1 stage II a AJCC 8   10/24/2016 Initial Diagnosis   Left breast biopsy 6:30 position 3 cm from nipple: IDC grade 2, DCIS, ER 0%, PR 0%, Ki-67 15% HER-2 positive ratio 2.1; 4:00 position 3 cm from nipple: IDC grade 2, DCIS, ER 0%, PR 0%, Ki-67 35%, HER-2 positive ratio 2.02; left axillary lymph node biopsy positive   11/04/2016 - 02/17/2017 Neo-Adjuvant Chemotherapy   TCH Perjeta 6 cycles followed by Herceptin + Perjeta maintenance to be completed September 2019   11/30/2016 Genetic Testing   Negative genetic testing on the common hereditary cancer panel.  The Hereditary Gene Panel offered by [facility] includes sequencing and/or deletion duplication testing of the following 47 genes: APC, ATM, AXIN2, BARD1, BMPR1A, BRCA1, BRCA2, BRIP1, CDH1, CDK4, CDKN2A (p14ARF), CDKN2A (p16INK4a), CHEK2, CTNNA1, DICER1, EPCAM (Deletion/duplication testing only), GREM1 (promoter region deletion/duplication testing only), KIT, MEN1, MLH1, MSH2, MSH3, MSH6, MUTYH,  NBN, NF1, NHTL1, PALB2, PDGFRA, PMS2, POLD1, POLE, PTEN, RAD50, RAD51C, RAD51D, SDHB, SDHC, SDHD, SMAD4, SMARCA4. STK11, TP53, TSC1, TSC2, and VHL.  The following genes were evaluated for sequence changes only: SDHA and HOXB13 c.251G>A variant only. The report date is November 30, 2016.    03/27/2017 Surgery   Bilateral mastectomies: Left mastectomy: IDC grade 2 0.9 cm, nodes negative, right mastectomy benign, ER 0%, PR 0%, HER-2 positive ratio 2.6   05/08/2017 - 06/09/2017 Radiation Therapy   Adjuvant radiation therapy   10/23/2017 Miscellaneous   Neratinib discontinued after 4 weeks for severe diarrhea   07/25/2018 Relapse/Recurrence   MRI of right elbow showed bone lesion consistent with malignancy. PET scan showed bilateral pulmonary nodules and several lytic bone lesions compatible with metastatic disease. Brain MRI on 08/02/18 showed no evidence of metastatic disease.   08/02/2018 PET scan   Bilateral hypermetabolic lung nodules, LUL 1.3 cm with SUV 3.88, lingular nodule 1.4 cm SUV 3.7, central lingular nodule 1.2 cm SUV 9.76, right middle lobe nodule 1.5 cm SUV 9.9, lytic bone metastases inferior pubic ramus, sacrum, T12, right 11th rib.   08/08/2018 Procedure   Lung biopsy: metastatic carcinoma, HER-2 negative (0), ER/PR negative.   08/10/2018 -  Radiation Therapy   Palliative radiatio to the right humerus along the medial condyle   08/24/2018 - 09/05/2018 Radiation Therapy   Palliative radiation to the right 11th rib and right elbow   09/26/2018 - 12/04/2018 Chemotherapy   Carboplatin atezolizumab at [facility] with Dr. Janan Halter on [place] clinical trial stopped because of new T5 metastases (toxicities included myopathy required prednisone, immune mediated thyroiditis),  right upper extremity DVT on apixaban   12/14/2018 -  Chemotherapy   The patient had pegfilgrastim-bmez (ZIEXTENZO) injection 6 mg, 6 mg, Subcutaneous,  Once, 2 of 2 cycles Administration: 6 mg (03/02/2019), 6 mg  (02/16/2019), 6 mg (03/16/2019), 6 mg (03/29/2019) eriBULin mesylate (HALAVEN) 2.7 mg in sodium chloride 0.9 % 100 mL chemo infusion, 1.42 mg/m2 = 2.65 mg, Intravenous,  Once, 4 of 4 cycles Dose modification: 1.1 mg/m2 (original dose 1.4 mg/m2, Cycle 2, Reason: Dose not tolerated, Comment: neutropenic fever), 0.7 mg/m2 (original dose 1.4 mg/m2, Cycle 4, Reason: Provider Judgment), 0.7 mg/m2 (original dose 1.4 mg/m2, Cycle 4, Reason: Provider Judgment) Administration: 2.7 mg (12/14/2018), 2.7 mg (12/21/2018), 2 mg (01/03/2019), 2 mg (01/10/2019), 1.35 mg (02/15/2019), 1.35 mg (03/01/2019), 1.35 mg (03/15/2019), 1.35 mg (03/28/2019)  for chemotherapy treatment.    12/17/2018 -  Radiation Therapy   Palliative radiation to sternal, sacral & pelvic lesions and SRS for T3 and C7-T1 lesions.   Bone metastases ([place])  08/07/2018 Initial Diagnosis   Bone metastases ([place])   12/14/2018 -  Chemotherapy   The patient had pertuzumab (PERJETA) 420 mg in sodium chloride 0.9 % 250 mL chemo infusion, 420 mg (100 % of original dose 420 mg), Intravenous, Once, 5 of 6 cycles Dose modification: 420 mg (original dose 420 mg, Cycle 1, Reason: Provider Judgment) Administration: 420 mg (12/14/2018), 420 mg (01/10/2019), 420 mg (03/28/2019), 420 mg (01/30/2019), 420 mg (03/01/2019) trastuzumab-dkst (OGIVRI) 600 mg in sodium chloride 0.9 % 250 mL chemo infusion, 609 mg, Intravenous,  Once, 5 of 6 cycles Administration: 600 mg (12/14/2018), 450 mg (01/10/2019), 450 mg (03/28/2019), 450 mg (01/30/2019), 450 mg (03/01/2019)  for chemotherapy treatment.    12/17/2018 -  Radiation Therapy   Palliative radiation to sternal, sacral & pelvic lesions and SRS for T3 and C7-T1 lesions.     CHIEF COMPLIANT: Urgent visit because of worsening pain  INTERVAL HISTORY: Laura Anthony is a 44 year old with emergency metastatic breast cancer who is currently on Halaven along with Herceptin and Perjeta.  She has been receiving it every 2  weeks and her blood counts are remained steady.  She called over the weekend complaining of intense pain related to her metastatic disease.  Previously she was not under excellent control since she received the nerve block injection by Dr. Maryjean Ka.  It appears that the effect of the nerve block has worn off and she is experiencing worsening discomfort. The pain is almost epigastric to left lower chest wall area.  It comes on with inspiration.  It is not reproducible.  She is not able to take a deep breath.  She thinks is different than the pain that she was experiencing previously.  She also complains of left hip discomfort.   ALLERGIES:  is allergic to sumatriptan; statins; and tape.  MEDICATIONS:  Current Outpatient Medications  Medication Sig Dispense Refill  . ELIQUIS 5 MG TABS tablet Take 1 tablet (5 mg total) by mouth 2 (two) times daily. 60 tablet 3  . FLUoxetine (PROZAC) 20 MG capsule Take 1 capsule (20 mg total) by mouth daily. 90 capsule 1  . FLUoxetine (PROZAC) 40 MG capsule Take 1 capsule (40 mg total) by mouth daily. 90 capsule 1  . gabapentin (NEURONTIN) 300 MG capsule Take 2 capsules (600 mg total) by mouth at bedtime. 60 capsule 5  . levothyroxine (SYNTHROID) 88 MCG tablet Take 1 tablet (88 mcg total) by mouth daily before breakfast. 90 tablet 0  . lidocaine-prilocaine (EMLA) cream APPLY  1 APPLICATION TOPICALLY AS NEEDED. 30 g 0  . lisdexamfetamine (VYVANSE) 30 MG capsule Take 30 mg by mouth every morning.     . methadone (DOLOPHINE) 10 MG tablet Take 1 tablet (10 mg total) by mouth every 6 (six) hours as needed. 120 tablet 0  . naloxone (NARCAN) 4 MG/0.1ML LIQD nasal spray kit Spray once in one nostril prn overdose, may repeat x 1 1 each 1  . ondansetron (ZOFRAN) 8 MG tablet Take 1 tablet (8 mg total) by mouth 2 (two) times daily as needed (Nausea or vomiting). 30 tablet 1  . pantoprazole (PROTONIX) 40 MG tablet TAKE 1 TABLET BY MOUTH TWICE A DAY 60 tablet 0  . polyethylene  glycol (MIRALAX / GLYCOLAX) 17 g packet Take 17 g by mouth daily as needed for mild constipation or moderate constipation. 14 each 0  . prochlorperazine (COMPAZINE) 10 MG tablet Take 1 tablet (10 mg total) by mouth every 6 (six) hours as needed (Nausea or vomiting). 30 tablet 1  . rizatriptan (MAXALT) 10 MG tablet Take 10 mg by mouth daily as needed for migraine (and may repeat once in 2 hours, if no relief).     Marland Kitchen senna-docusate (SENOKOT-S) 8.6-50 MG tablet Take 1 tablet by mouth 2 (two) times daily. (Patient taking differently: Take 1 tablet by mouth 2 (two) times daily as needed for moderate constipation. ) 60 tablet 0  . traZODone (DESYREL) 50 MG tablet Take 1 tablet (50 mg total) by mouth at bedtime as needed for sleep. (Patient taking differently: Take 50 mg by mouth at bedtime. ) 90 tablet 1   Current Facility-Administered Medications  Medication Dose Route Frequency Provider Last Rate Last Admin  . HYDROmorphone (DILAUDID) injection 2 mg  2 mg Intramuscular Once Nicholas Lose, MD       Facility-Administered Medications Ordered in Other Visits  Medication Dose Route Frequency Provider Last Rate Last Admin  . sodium chloride flush (NS) 0.9 % injection 10 mL  10 mL Intravenous PRN Nicholas Lose, MD   10 mL at 03/01/19 1400    PHYSICAL EXAMINATION: ECOG PERFORMANCE STATUS: 2 - Symptomatic, <50% confined to bed  Vitals:   04/01/19 0943  BP: 119/84  Pulse: 95  Resp: 17  Temp: 97.8 F (36.6 C)  SpO2: 94%   Filed Weights   04/01/19 0943  Weight: 163 lb 12.8 oz (74.3 kg)       LABORATORY DATA:  I have reviewed the data as listed CMP Latest Ref Rng & Units 03/28/2019 03/15/2019 03/06/2019  Glucose 70 - 99 mg/dL 108(H) 104(H) 143(H)  BUN 6 - 20 mg/dL 12 10 5(L)  Creatinine 0.44 - 1.00 mg/dL 0.76 0.73 0.84  Sodium 135 - 145 mmol/L 141 139 139  Potassium 3.5 - 5.1 mmol/L 3.6 3.7 3.8  Chloride 98 - 111 mmol/L 105 104 103  CO2 22 - 32 mmol/L _0 Calcium 8.9 - 10.3 mg/dL 8.5(L)  8.7(L) 8.9  Total Protein 6.5 - 8.1 g/dL 6.9 7.1 7.4  Total Bilirubin 0.3 - 1.2 mg/dL 0.3 0.3 0.5  Alkaline Phos 38 - 126 U/L 183(H) 145(H) 186(H)  AST 15 - 41 U/L _1 ALT 0 - 44 U/L _2 Lab Results  Component Value Date   WBC 10.0 03/28/2019   HGB 9.6 (L) 03/28/2019   HCT 31.5 (L) 03/28/2019   MCV 106.1 (H) 03/28/2019   PLT 159 03/28/2019   NEUTROABS 8.4 (H) 03/28/2019  ASSESSMENT & PLAN:  Malignant neoplasm of lower-outer quadrant of left breast of female, estrogen receptor negative ([place]) Mammogram and ultrasound of the left breast revealed 1.7 cm mass at 4:00 position, 6:30 position 5 x 4 x 4 mm mass, 6:00 position 5 cm nipple 7 x 6 x 11 mm, left axillary lymph node with thickened cortex, T1c N1 stage II a AJCC 8  10/24/2016: Left breast biopsy 6:30 position 3 cm from nipple: IDC grade 2, DCIS, ER 0%, PR 0%, Ki-67 15% HER-2 positive ratio 2.1; 4:00 position 3 cm from nipple: IDC grade 2, DCIS, ER 0%, PR 0%, Ki-67 35%, HER-2 positive ratio 2.02 Lymph node biopsy positive  Treatment Summary: 1. Neoadjuvant chemotherapy with TCHPcompleted 02/17/2017 this would be followed by Herceptinand Perjetamaintenance for 1 yearcompleted September 2019 2.Bilateral mastectomies 03/28/2016:Bilateral mastectomies: Left mastectomy: IDC grade 2 0.9 cm, nodes negative, right mastectomy benign, ER 0%, PR 0%, HER-2 positive ratio 2.6 3.Adjuvant radiation4/08/2017 to 06/09/2017 4.Neratinib started 10/12/2017 discontinued due to diarrhea 5. Elbow fracture: Due to metastatic disease, palliative radiation therapy 6. Carboplatin atezolizumab at [facility] on a clinical trial [facility] 043 stopped for progression 12/04/2018 ---------------------------------------------------------------------------------------------------------------- Lung nodule biopsy: Metastatic breast cancer triple negative  CT CAP 12/03/2018: Progression of lung nodules and bone metastases done at [facility] CTA 01/2019: improvement in lung nodules Current treatment:Eribulin/Herceptin/Perjeta.  Severe and worsening pain: It appears that the effect of the nerve block have worn off. I recommend that she see Dr. Maryjean Ka once again. Over the weekend I recommended doubling down on the dosage of the pain medication. The pain comes on with deep inspiration.  With the dog of pain medications she is doing much better.  However she is mostly incapacitated with the pain.  Rates it at 7 out of 10.  It feels different than her bony pain.  Plan: We will obtain CT chest with PE protocol stat today. Intramuscular Dilaudid injection for pain relief. Treatment plan based upon the CT chest.  If it is related to tumor infiltrating the chest wall then we can asked Dr. Maryjean Ka to see her.  If it is a blood clot then we will treat her with anticoagulation.  Orders Placed This Encounter  Procedures  . CT ANGIO CHEST PE W OR WO CONTRAST    Standing Status:   Future    Standing Expiration Date:   07/01/2020    Order Specific Question:   ** REASON FOR EXAM (FREE TEXT)    Answer:   Metastatic breast cancer with severe pain in the left lower chest, highly concerning for PE    Order Specific Question:   If indicated for the ordered procedure, I authorize the administration of contrast media per Radiology protocol    Answer:   Yes    Order Specific Question:   Is patient pregnant?    Answer:   No    Order Specific Question:   Preferred imaging location?    Answer:   [facility]    Order Specific Question:   Release to patient    Answer:   Immediate    Order Specific Question:   Radiology Contrast Protocol - do NOT remove file path    Answer:   _0 [email or web address]   The patient has a good understanding of the overall plan. she agrees with it. she will call with any problems that may develop before the next visit here. Total time spent: 30 mins including face to face time and time  spent for  planning, charting and co-ordination of care   Harriette Ohara, MD 04/01/19  Addendum: Patient required assistance to getting up and moving around.  Because of severe back pain she is unable to walk without assistance. She is also unsteady on her gait.  It is for these reasons I recommend that she get a lightweight wheelchair.

## 2019-04-02 ENCOUNTER — Telehealth: Payer: Self-pay | Admitting: *Deleted

## 2019-04-02 ENCOUNTER — Other Ambulatory Visit: Payer: Self-pay | Admitting: *Deleted

## 2019-04-02 DIAGNOSIS — Z171 Estrogen receptor negative status [ER-]: Secondary | ICD-10-CM

## 2019-04-02 DIAGNOSIS — C50512 Malignant neoplasm of lower-outer quadrant of left female breast: Secondary | ICD-10-CM

## 2019-04-02 DIAGNOSIS — C7951 Secondary malignant neoplasm of bone: Secondary | ICD-10-CM

## 2019-04-02 NOTE — Telephone Encounter (Signed)
Pt called with c/o of severe pain to bilateral hips which is causing pt the inability to move throughout her home and complete ADLs. Dr. Lindi Adie approved order of wheelchair to provide assistance with moving around home and with ADLs.     Durable Medical Equipment  (From admission, onward)         Start     Ordered   04/02/19 0000  For home use only DME high strength lightweight manual wheelchair with seat cushion    Comments: Patient suffers from Stage IV breast cancer with severe bilateral hip pain which impairs their ability to perform daily activities like ADLs in the home.  A walking aid will not resolve issue with performing activities of daily living. A wheelchair will allow patient to safely perform daily activities.Length of need is unknown at this time . Accessories: elevating leg rests (ELRs), wheel locks, extensions and anti-tippers.   04/02/19 1159

## 2019-04-02 NOTE — Progress Notes (Signed)
Pt experiencing severe bilateral hip and back pain related to her metastatic disease which is causing her to not be able to walk without assistance.  Per Dr. Lindi Adie, pt currently can not preform ADL's independently and will need a lightweight wheel chair to help with every day activity.  Pt needing lightweight wheel chair due to increased fatigue and decrease in muscle strength due to the nature of her non curable metastatic disease. Orders placed for home DME lightweight wheel chair and wheel chair cushion.

## 2019-04-04 ENCOUNTER — Other Ambulatory Visit: Payer: Self-pay | Admitting: *Deleted

## 2019-04-04 NOTE — Progress Notes (Signed)
  Radiation Oncology         [phone] ________________________________  Name: Laura Anthony MRN: [id number]  Date: 01/03/2019  DOB: [birth year]  SIMULATION AND TREATMENT PLANNING NOTE  DIAGNOSIS:     ICD-10-CM   1. Bone metastases (H[facility])  C79.51      Site:  T/L spine/ left rib  NARRATIVE:  The patient was brought to the [place].  Identity was confirmed.  All relevant records and images related to the planned course of therapy were reviewed.   Written consent to proceed with treatment was confirmed which was freely given after reviewing the details related to the planned course of therapy had been reviewed with the patient.  Then, the patient was set-up in a stable reproducible  supine position for radiation therapy.  CT images were obtained.  Surface markings were placed.    Medically necessary complex treatment device(s) for immobilization:  Vac-lock bag.   The CT images were loaded into the planning software.  Then the target and avoidance structures were contoured.  Treatment planning then occurred.  The radiation prescription was entered and confirmed.  A total of 4 complex treatment devices were fabricated which relate to the designed radiation treatment fields. Each of these customized fields/ complex treatment devices will be used on a daily basis during the radiation course. I have requested : 3D Simulation  I have requested a DVH of the following structures: target, cord, lungs.   The patient will undergo daily image guidance to ensure accurate localization of the target, and adequate minimize dose to the normal surrounding structures in close proximity to the target.   PLAN:  The patient will receive 30 Gy in 10 fractions.  ________________________________   Jodelle Gross, MD, PhD

## 2019-04-04 NOTE — Progress Notes (Signed)
  Radiation Oncology         [phone] ________________________________  Name: Laura Anthony MRN: [id number]  Date: 01/21/2019  DOB: February 15, 1975  End of Treatment Note  Diagnosis:   Bone metastasis     Indication for treatment::  palliative       Radiation treatment dates:   01/07/19 - 01/21/19  Site/dose:   The patient was treated to the T/L spine and adjacent left-sided rib to a dose of 30 Gy in 10 fractions using a 4-field 3D conformal technique.  Narrative: The patient tolerated radiation treatment relatively well.   Plan: The patient has completed radiation treatment. The patient will return to radiation oncology clinic for routine followup in one month. I advised the patient to call or return sooner if they have any questions or concerns related to their recovery or treatment. ________________________________  Jodelle Gross, M.D., Ph.D.

## 2019-04-05 ENCOUNTER — Other Ambulatory Visit: Payer: Self-pay

## 2019-04-05 ENCOUNTER — Ambulatory Visit (HOSPITAL_COMMUNITY)
Admission: RE | Admit: 2019-04-05 | Discharge: 2019-04-05 | Disposition: A | Discharge status: Home / Self Care | Payer: 59 | Source: Ambulatory Visit | Attending: Hematology and Oncology | Admitting: Hematology and Oncology

## 2019-04-05 ENCOUNTER — Encounter (HOSPITAL_COMMUNITY): Payer: Self-pay

## 2019-04-05 DIAGNOSIS — Z171 Estrogen receptor negative status [ER-]: Secondary | ICD-10-CM | POA: Diagnosis present

## 2019-04-05 DIAGNOSIS — C50512 Malignant neoplasm of lower-outer quadrant of left female breast: Secondary | ICD-10-CM | POA: Diagnosis not present

## 2019-04-05 MED ORDER — HEPARIN SOD (PORK) LOCK FLUSH 100 UNIT/ML IV SOLN: 500.0000 [IU] | Freq: Once | INTRAVENOUS | Status: AC

## 2019-04-05 MED ORDER — HEPARIN SOD (PORK) LOCK FLUSH 100 UNIT/ML IV SOLN
INTRAVENOUS | Status: AC
  Administered 2019-04-05: 500 [IU] via INTRAVENOUS
  Filled 2019-04-05: qty 5

## 2019-04-05 MED ORDER — SODIUM CHLORIDE (PF) 0.9 % IJ SOLN
INTRAMUSCULAR | Status: AC
  Filled 2019-04-05: qty 50

## 2019-04-05 MED ORDER — IOHEXOL 300 MG/ML  SOLN
100.0000 mL | Freq: Once | INTRAMUSCULAR | Status: AC | PRN
  Administered 2019-04-05: 100 mL via INTRAVENOUS

## 2019-04-11 ENCOUNTER — Encounter: Payer: Self-pay | Admitting: *Deleted

## 2019-04-11 ENCOUNTER — Inpatient Hospital Stay: Payer: 59

## 2019-04-11 ENCOUNTER — Other Ambulatory Visit: Payer: Self-pay

## 2019-04-11 ENCOUNTER — Other Ambulatory Visit: Payer: Self-pay | Admitting: Hematology and Oncology

## 2019-04-11 VITALS — BP 125/87 | HR 70 | Temp 97.8°F | Resp 16

## 2019-04-11 DIAGNOSIS — C50512 Malignant neoplasm of lower-outer quadrant of left female breast: Secondary | ICD-10-CM

## 2019-04-11 DIAGNOSIS — G893 Neoplasm related pain (acute) (chronic): Secondary | ICD-10-CM

## 2019-04-11 DIAGNOSIS — Z171 Estrogen receptor negative status [ER-]: Secondary | ICD-10-CM

## 2019-04-11 DIAGNOSIS — Z7189 Other specified counseling: Secondary | ICD-10-CM

## 2019-04-11 DIAGNOSIS — C7951 Secondary malignant neoplasm of bone: Secondary | ICD-10-CM

## 2019-04-11 DIAGNOSIS — Z95828 Presence of other vascular implants and grafts: Secondary | ICD-10-CM

## 2019-04-11 LAB — CMP (CANCER CENTER ONLY)
ALT: 23 U/L (ref 0–44)
AST: 27 U/L (ref 15–41)
Albumin: 3.2 g/dL — ABNORMAL LOW (ref 3.5–5.0)
Alkaline Phosphatase: 184 U/L — ABNORMAL HIGH (ref 38–126)
Anion gap: 8 (ref 5–15)
BUN: 15 mg/dL (ref 6–20)
CO2: 28 mmol/L (ref 22–32)
Calcium: 8.8 mg/dL — ABNORMAL LOW (ref 8.9–10.3)
Chloride: 103 mmol/L (ref 98–111)
Creatinine: 0.72 mg/dL (ref 0.44–1.00)
GFR, Est AFR Am: >60 mL/min (ref >60)
GFR, Estimated: >60 mL/min (ref >60)
Glucose, Bld: 76 mg/dL (ref 70–99)
Potassium: 4 mmol/L (ref 3.5–5.1)
Sodium: 139 mmol/L (ref 135–145)
Total Bilirubin: 0.3 mg/dL (ref 0.3–1.2)
Total Protein: 7.3 g/dL (ref 6.5–8.1)

## 2019-04-11 LAB — CBC WITH DIFFERENTIAL (CANCER CENTER ONLY)
Abs Immature Granulocytes: 0.09 10*3/uL — ABNORMAL HIGH (ref 0.00–0.07)
Basophils Absolute: 0 10*3/uL (ref 0.0–0.1)
Basophils Relative: 0 %
Eosinophils Absolute: 0 10*3/uL (ref 0.0–0.5)
Eosinophils Relative: 0 %
HCT: 34.1 % — ABNORMAL LOW (ref 36.0–46.0)
Hemoglobin: 10.7 g/dL — ABNORMAL LOW (ref 12.0–15.0)
Immature Granulocytes: 1 %
Lymphocytes Relative: 6 %
Lymphs Abs: 0.7 10*3/uL (ref 0.7–4.0)
MCH: 32.9 pg (ref 26.0–34.0)
MCHC: 31.4 g/dL (ref 30.0–36.0)
MCV: 104.9 fL — ABNORMAL HIGH (ref 80.0–100.0)
Monocytes Absolute: 0.8 10*3/uL (ref 0.1–1.0)
Monocytes Relative: 7 %
Neutro Abs: 9.8 10*3/uL — ABNORMAL HIGH (ref 1.7–7.7)
Neutrophils Relative %: 86 %
Platelet Count: 159 10*3/uL (ref 150–400)
RBC: 3.25 MIL/uL — ABNORMAL LOW (ref 3.87–5.11)
RDW: 16.9 % — ABNORMAL HIGH (ref 11.5–15.5)
WBC Count: 11.4 10*3/uL — ABNORMAL HIGH (ref 4.0–10.5)
nRBC: 0 % (ref 0.0–0.2)

## 2019-04-11 LAB — MAGNESIUM: Magnesium: 2.2 mg/dL (ref 1.7–2.4)

## 2019-04-11 LAB — PHOSPHORUS: Phosphorus: 2.4 mg/dL — ABNORMAL LOW (ref 2.5–4.6)

## 2019-04-11 MED ORDER — FAMOTIDINE IN NACL 20-0.9 MG/50ML-% IV SOLN
20.0000 mg | Freq: Once | INTRAVENOUS | Status: AC
  Administered 2019-04-11: 20 mg via INTRAVENOUS

## 2019-04-11 MED ORDER — ATROPINE SULFATE 1 MG/ML IJ SOLN: 0.5000 mg | Freq: Once | INTRAMUSCULAR | Status: DC | PRN

## 2019-04-11 MED ORDER — HEPARIN SOD (PORK) LOCK FLUSH 100 UNIT/ML IV SOLN
500.0000 [IU] | Freq: Once | INTRAVENOUS | Status: AC | PRN
  Administered 2019-04-11: 500 [IU]
  Filled 2019-04-11: qty 5

## 2019-04-11 MED ORDER — SODIUM CHLORIDE 0.9% FLUSH
10.0000 mL | INTRAVENOUS | Status: DC | PRN
  Administered 2019-04-11: 10 mL via INTRAVENOUS
  Filled 2019-04-11: qty 10

## 2019-04-11 MED ORDER — SODIUM CHLORIDE 0.9 % IV SOLN
Freq: Once | INTRAVENOUS | Status: AC
  Filled 2019-04-11: qty 250

## 2019-04-11 MED ORDER — PALONOSETRON HCL INJECTION 0.25 MG/5ML
INTRAVENOUS | Status: AC
  Filled 2019-04-11: qty 5

## 2019-04-11 MED ORDER — SODIUM CHLORIDE 0.9 % IV SOLN
150.0000 mg | Freq: Once | INTRAVENOUS | Status: AC
  Administered 2019-04-11: 150 mg via INTRAVENOUS
  Filled 2019-04-11: qty 150

## 2019-04-11 MED ORDER — SODIUM CHLORIDE 0.9% FLUSH
10.0000 mL | INTRAVENOUS | Status: DC | PRN
  Administered 2019-04-11: 10 mL
  Filled 2019-04-11: qty 10

## 2019-04-11 MED ORDER — DIPHENHYDRAMINE HCL 50 MG/ML IJ SOLN
INTRAMUSCULAR | Status: AC
  Filled 2019-04-11: qty 1

## 2019-04-11 MED ORDER — ACETAMINOPHEN 325 MG PO TABS
ORAL_TABLET | ORAL | Status: AC
  Filled 2019-04-11: qty 2

## 2019-04-11 MED ORDER — ACETAMINOPHEN 325 MG PO TABS
650.0000 mg | ORAL_TABLET | Freq: Once | ORAL | Status: AC
  Administered 2019-04-11: 650 mg via ORAL

## 2019-04-11 MED ORDER — DEXAMETHASONE SODIUM PHOSPHATE 10 MG/ML IJ SOLN
10.0000 mg | Freq: Once | INTRAMUSCULAR | Status: AC
  Administered 2019-04-11: 10 mg via INTRAVENOUS

## 2019-04-11 MED ORDER — DEXAMETHASONE SODIUM PHOSPHATE 10 MG/ML IJ SOLN
INTRAMUSCULAR | Status: AC
  Filled 2019-04-11: qty 1

## 2019-04-11 MED ORDER — ATROPINE SULFATE 1 MG/ML IJ SOLN
INTRAMUSCULAR | Status: AC
  Filled 2019-04-11: qty 1

## 2019-04-11 MED ORDER — PALONOSETRON HCL INJECTION 0.25 MG/5ML
0.2500 mg | Freq: Once | INTRAVENOUS | Status: AC
  Administered 2019-04-11: 0.25 mg via INTRAVENOUS

## 2019-04-11 MED ORDER — FAMOTIDINE IN NACL 20-0.9 MG/50ML-% IV SOLN
INTRAVENOUS | Status: AC
  Filled 2019-04-11: qty 50

## 2019-04-11 MED ORDER — SODIUM CHLORIDE 0.9 % IV SOLN
8.0000 mg/kg | Freq: Once | INTRAVENOUS | Status: AC
  Administered 2019-04-11: 590 mg via INTRAVENOUS
  Filled 2019-04-11: qty 59

## 2019-04-11 MED ORDER — DIPHENHYDRAMINE HCL 50 MG/ML IJ SOLN
25.0000 mg | Freq: Once | INTRAMUSCULAR | Status: AC
  Administered 2019-04-11: 25 mg via INTRAVENOUS

## 2019-04-11 NOTE — Progress Notes (Signed)
Patient is extremely at high risk for neutropenia and neutropenic infection.  In fact she was previously admitted with neutropenic infection. Therefore I recommend adding Udenyca on day 9

## 2019-04-11 NOTE — Patient Instructions (Signed)
[facility] Discharge Instructions for Patients Receiving Chemotherapy  Today you received the following chemotherapy agent: Sacituzumab (TRODELVY)  To help prevent nausea and vomiting after your treatment, we encourage you to take your nausea medication as directed by your MD.   If you develop nausea and vomiting that is not controlled by your nausea medication, call the clinic.   BELOW ARE SYMPTOMS THAT SHOULD BE REPORTED IMMEDIATELY:  *FEVER GREATER THAN 100.5 F  *CHILLS WITH OR WITHOUT FEVER  NAUSEA AND VOMITING THAT IS NOT CONTROLLED WITH YOUR NAUSEA MEDICATION  *UNUSUAL SHORTNESS OF BREATH  *UNUSUAL BRUISING OR BLEEDING  TENDERNESS IN MOUTH AND THROAT WITH OR WITHOUT PRESENCE OF ULCERS  *URINARY PROBLEMS  *BOWEL PROBLEMS  UNUSUAL RASH Items with * indicate a potential emergency and should be followed up as soon as possible.  Feel free to call the clinic should you have any questions or concerns. The clinic phone number is [phone].  Please show the [facility] at check-in to the Emergency Department and triage nurse.  Coronavirus (COVID-19) Are you at risk?  Are you at risk for the Coronavirus (COVID-19)?  To be considered HIGH RISK for Coronavirus (COVID-19), you have to meet the following criteria:  . Traveled to [place], [place], [place], [place] or [place]; or in the [place] to [place], [place], [place], or [place]; and have fever, cough, and shortness of breath within the last 2 weeks of travel OR . Been in close contact with a person diagnosed with COVID-19 within the last 2 weeks and have fever, cough, and shortness of breath . IF YOU DO NOT MEET THESE CRITERIA, YOU ARE CONSIDERED LOW RISK FOR COVID-19.  What to do if you are HIGH RISK for COVID-19?  Marland Kitchen If you are having a medical emergency, call 911. . Seek medical care right away. Before you go to a doctor's office, urgent care or emergency department, call ahead and  tell them about your recent travel, contact with someone diagnosed with COVID-19, and your symptoms. You should receive instructions from your physician's office regarding next steps of care.  . When you arrive at healthcare provider, tell the healthcare staff immediately you have returned from visiting [place], [place], [place], [place] or [place]; or traveled in the [place] to [place], [place], [place], or [place]; in the last two weeks or you have been in close contact with a person diagnosed with COVID-19 in the last 2 weeks.   . Tell the health care staff about your symptoms: fever, cough and shortness of breath. . After you have been seen by a medical provider, you will be either: o Tested for (COVID-19) and discharged home on quarantine except to seek medical care if symptoms worsen, and asked to  - Stay home and avoid contact with others until you get your results (4-5 days)  - Avoid travel on public transportation if possible (such as bus, train, or airplane) or o Sent to the Emergency Department by EMS for evaluation, COVID-19 testing, and possible admission depending on your condition and test results.  What to do if you are LOW RISK for COVID-19?  Reduce your risk of any infection by using the same precautions used for avoiding the common cold or flu:  Marland Kitchen Wash your hands often with soap and warm water for at least 20 seconds.  If soap and water are not readily available, use an alcohol-based hand sanitizer with at least 60% alcohol.  Marland Kitchen  If coughing or sneezing, cover your mouth and nose by coughing or sneezing into the elbow areas of your shirt or coat, into a tissue or into your sleeve (not your hands). . Avoid shaking hands with others and consider head nods or verbal greetings only. . Avoid touching your eyes, nose, or mouth with unwashed hands.  . Avoid close contact with people who are sick. . Avoid places or events with large numbers of people in one location, like  concerts or sporting events. . Carefully consider travel plans you have or are making. . If you are planning any travel outside or inside the [place], visit the CDC's Travelers' Health webpage for the latest health notices. . If you have some symptoms but not all symptoms, continue to monitor at home and seek medical attention if your symptoms worsen. . If you are having a medical emergency, call 911.   ADDITIONAL HEALTHCARE OPTIONS FOR PATIENTS  [facility] / e-Visit: [email or web address]         [facility]: [phone]  [facility]: [phone]                   [facility]: [phone]  

## 2019-04-12 ENCOUNTER — Telehealth: Payer: Self-pay | Admitting: *Deleted

## 2019-04-12 NOTE — Telephone Encounter (Signed)
RN placed call to pt regarding pt starting Trodelvy yesterday.  Pt states she is feeling great and no negative side effects. RN encouraged pt to continue staying hydrated and to take PRN nausea medication as needed.  Pt verbalized understanding and appreciative of the call.

## 2019-04-16 NOTE — Progress Notes (Signed)
 Patient Care Team: Badger, [name], MD as PCP - General (Family Medicine) Stuart, [name], RN as Oncology Nurse Navigator Martini, [name], RN as Oncology Nurse Navigator Serpe, [name], NP as Nurse Practitioner (Hospice and Palliative Medicine) Jayke Caul, MD as Consulting Physician (Hematology and Oncology) Causey, Lindsey Cornetto, NP as Nurse Practitioner (Hematology and Oncology)  DIAGNOSIS:    ICD-10-CM   1. Malignant neoplasm of lower-outer quadrant of left breast of female, estrogen receptor negative (HCC)  C50.512    Z17.1     SUMMARY OF ONCOLOGIC HISTORY: 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, estrogen receptor negative (HCC)  10/20/2016 Mammogram   Mammogram and ultrasound of the left breast revealed 1.7 cm mass at 4:00 position, 6:30 position 5 x 4 x 4 mm mass, 6:00 position 5 cm nipple 7 x 6 x 11 mm, left axillary lymph node with thickened cortex, T1c N1 stage II a AJCC 8   10/24/2016 Initial Diagnosis   Left breast biopsy 6:30 position 3 cm from nipple: IDC grade 2, DCIS, ER 0%, PR 0%, Ki-67 15% HER-2 positive ratio 2.1; 4:00 position 3 cm from nipple: IDC grade 2, DCIS, ER 0%, PR 0%, Ki-67 35%, HER-2 positive ratio 2.02; left axillary lymph node biopsy positive   11/04/2016 - 02/17/2017 Neo-Adjuvant Chemotherapy   TCH Perjeta 6 cycles followed by Herceptin + Perjeta maintenance to be completed September 2019   11/30/2016 Genetic Testing   Negative genetic testing on the common hereditary cancer panel.  The Hereditary Gene Panel offered by [facility] includes sequencing and/or deletion duplication testing of the following 47 genes: APC, ATM, AXIN2, BARD1, BMPR1A, BRCA1, BRCA2, BRIP1, CDH1, CDK4, CDKN2A (p14ARF), CDKN2A (p16INK4a), CHEK2, CTNNA1, DICER1, EPCAM (Deletion/duplication testing only), GREM1 (promoter region deletion/duplication testing only), KIT, MEN1, MLH1, MSH2, MSH3, MSH6, MUTYH, NBN, NF1, NHTL1, PALB2, PDGFRA,  PMS2, POLD1, POLE, PTEN, RAD50, RAD51C, RAD51D, SDHB, SDHC, SDHD, SMAD4, SMARCA4. STK11, TP53, TSC1, TSC2, and VHL.  The following genes were evaluated for sequence changes only: SDHA and HOXB13 c.251G>A variant only. The report date is November 30, 2016.    03/27/2017 Surgery   Bilateral mastectomies: Left mastectomy: IDC grade 2 0.9 cm, nodes negative, right mastectomy benign, ER 0%, PR 0%, HER-2 positive ratio 2.6   05/08/2017 - 06/09/2017 Radiation Therapy   Adjuvant radiation therapy   10/23/2017 Miscellaneous   Neratinib discontinued after 4 weeks for severe diarrhea   07/25/2018 Relapse/Recurrence   MRI of right elbow showed bone lesion consistent with malignancy. PET scan showed bilateral pulmonary nodules and several lytic bone lesions compatible with metastatic disease. Brain MRI on 08/02/18 showed no evidence of metastatic disease.   08/02/2018 PET scan   Bilateral hypermetabolic lung nodules, LUL 1.3 cm with SUV 3.88, lingular nodule 1.4 cm SUV 3.7, central lingular nodule 1.2 cm SUV 9.76, right middle lobe nodule 1.5 cm SUV 9.9, lytic bone metastases inferior pubic ramus, sacrum, T12, right 11th rib.   08/08/2018 Procedure   Lung biopsy: metastatic carcinoma, HER-2 negative (0), ER/PR negative.   08/10/2018 -  Radiation Therapy   Palliative radiatio to the right humerus along the medial condyle   08/24/2018 - 09/05/2018 Radiation Therapy   Palliative radiation to the right 11th rib and right elbow   09/26/2018 - 12/04/2018 Chemotherapy   Carboplatin atezolizumab at [facility] with Dr. Lisa Carey on [facility] 043 clinical trial stopped because of new T5 metastases (toxicities included myopathy required prednisone, immune mediated thyroiditis), right upper   extremity DVT on apixaban   12/14/2018 - 04/10/2019 Chemotherapy   The patient had pegfilgrastim-bmez (ZIEXTENZO) injection 6 mg, 6 mg, Subcutaneous,  Once, 2 of 2 cycles Administration: 6 mg (03/02/2019), 6 mg (02/16/2019), 6 mg (03/16/2019),  6 mg (03/29/2019) eriBULin mesylate (HALAVEN) 2.7 mg in sodium chloride 0.9 % 100 mL chemo infusion, 1.42 mg/m2 = 2.65 mg, Intravenous,  Once, 4 of 4 cycles Dose modification: 1.1 mg/m2 (original dose 1.4 mg/m2, Cycle 2, Reason: Dose not tolerated, Comment: neutropenic fever), 0.7 mg/m2 (original dose 1.4 mg/m2, Cycle 4, Reason: Provider Judgment), 0.7 mg/m2 (original dose 1.4 mg/m2, Cycle 4, Reason: Provider Judgment) Administration: 2.7 mg (12/14/2018), 2.7 mg (12/21/2018), 2 mg (01/03/2019), 2 mg (01/10/2019), 1.35 mg (02/15/2019), 1.35 mg (03/01/2019), 1.35 mg (03/15/2019), 1.35 mg (03/28/2019)  for chemotherapy treatment.    12/17/2018 -  Radiation Therapy   Palliative radiation to sternal, sacral & pelvic lesions and SRS for T3 and C7-T1 lesions.   04/11/2019 -  Chemotherapy   The patient had palonosetron (ALOXI) injection 0.25 mg, 0.25 mg, Intravenous,  Once, 1 of 4 cycles Administration: 0.25 mg (04/11/2019) pegfilgrastim (NEULASTA) injection 6 mg, 6 mg, Subcutaneous, Once, 1 of 4 cycles fosaprepitant (EMEND) 150 mg in sodium chloride 0.9 % 145 mL IVPB, 150 mg, Intravenous,  Once, 1 of 4 cycles Administration: 150 mg (04/11/2019) sacituzumab govitecan-hziy (TRODELVY) 590 mg in sodium chloride 0.9 % 250 mL (1.9094 mg/mL) chemo infusion, 8 mg/kg = 590 mg (100 % of original dose 8 mg/kg), Intravenous,  Once, 1 of 4 cycles Dose modification: 8 mg/kg (original dose 8 mg/kg, Cycle 1, Reason: Provider Judgment) Administration: 590 mg (04/11/2019)  for chemotherapy treatment.    Bone metastases ([facility])  08/07/2018 Initial Diagnosis   Bone metastases ([facility])   12/14/2018 - 04/24/2019 Chemotherapy   The patient had pertuzumab (PERJETA) 420 mg in sodium chloride 0.9 % 250 mL chemo infusion, 420 mg (100 % of original dose 420 mg), Intravenous, Once, 5 of 6 cycles Dose modification: 420 mg (original dose 420 mg, Cycle 1, Reason: Provider Judgment) Administration: 420 mg (12/14/2018), 420 mg (01/10/2019), 420 mg  (03/28/2019), 420 mg (01/30/2019), 420 mg (03/01/2019) trastuzumab-dkst (OGIVRI) 600 mg in sodium chloride 0.9 % 250 mL chemo infusion, 609 mg, Intravenous,  Once, 5 of 6 cycles Administration: 600 mg (12/14/2018), 450 mg (01/10/2019), 450 mg (03/28/2019), 450 mg (01/30/2019), 450 mg (03/01/2019)  for chemotherapy treatment.    12/17/2018 -  Radiation Therapy   Palliative radiation to sternal, sacral & pelvic lesions and SRS for T3 and C7-T1 lesions.     CHIEF COMPLIANT: Follow-up of metastatic breast cancer cycle 1 day 8 Sacituzumab-Govitecan  INTERVAL HISTORY: Laura Anthony is a 44 y.o. with above-mentioned history of metastatic breast cancer who is currently on [place] along with Herceptin and Perjeta every 2 weeks. CT angio chest on 04/01/19 showed no evidence of PE, and new and enlarged pulmonary nodules, consistent with progression of metastatic disease. CT abdomen/pelvis on 04/05/19 showed no new or progressive metastases, and stable osseous metastases. She presents to the clinic today for follow-up after receiving first cycle of Sacituzumab-Govitecan..  She tells me that she tolerated the treatment extremely well without any nausea or vomiting.  She felt tired but that is an uncommon for her.  The pain in the chest has markedly improved after she received injections once again from Dr. Maryjean Ka.   ALLERGIES:  is allergic to sumatriptan; statins; and tape.  MEDICATIONS:  Current Outpatient Medications  Medication Sig Dispense Refill  .  dexamethasone (DECADRON) 4 MG tablet Take 2 tablets (8 mg) daily for 3 days after chemotherapy. Take with food. 30 tablet 1  . ELIQUIS 5 MG TABS tablet Take 1 tablet (5 mg total) by mouth 2 (two) times daily. 60 tablet 3  . FLUoxetine (PROZAC) 20 MG capsule Take 1 capsule (20 mg total) by mouth daily. 90 capsule 1  . FLUoxetine (PROZAC) 40 MG capsule Take 1 capsule (40 mg total) by mouth daily. 90 capsule 1  . gabapentin (NEURONTIN) 300 MG capsule  Take 2 capsules (600 mg total) by mouth at bedtime. 60 capsule 5  . Lactulose 20 GM/30ML SOLN Take 30 mLs (20 g total) by mouth daily as needed. 236 mL 1  . levothyroxine (SYNTHROID) 88 MCG tablet Take 1 tablet (88 mcg total) by mouth daily before breakfast. 90 tablet 0  . lidocaine-prilocaine (EMLA) cream APPLY 1 APPLICATION TOPICALLY AS NEEDED. 30 g 0  . lidocaine-prilocaine (EMLA) cream Apply to affected area once 30 g 3  . lisdexamfetamine (VYVANSE) 30 MG capsule Take 30 mg by mouth every morning.     . loperamide (IMODIUM A-D) 2 MG tablet Take 2 at diarrhea onset, then 1 every 2hr until 12hrs with no BM. May take 2 every 4hrs at night. If diarrhea recurs repeat. 100 tablet 1  . methadone (DOLOPHINE) 10 MG tablet Take 1 tablet (10 mg total) by mouth every 6 (six) hours as needed. 120 tablet 0  . naloxone (NARCAN) 4 MG/0.1ML LIQD nasal spray kit Spray once in one nostril prn overdose, may repeat x 1 1 each 1  . ondansetron (ZOFRAN) 8 MG tablet Take 1 tablet (8 mg total) by mouth 2 (two) times daily as needed. Start on the third day after chemotherapy. 30 tablet 1  . oxyCODONE-acetaminophen (PERCOCET) 10-325 MG tablet Take 1 tablet by mouth every 4 (four) hours as needed for pain. 120 tablet 0  . pantoprazole (PROTONIX) 40 MG tablet TAKE 1 TABLET BY MOUTH TWICE A DAY 60 tablet 0  . polyethylene glycol (MIRALAX / GLYCOLAX) 17 g packet Take 17 g by mouth daily as needed for mild constipation or moderate constipation. 14 each 0  . prochlorperazine (COMPAZINE) 10 MG tablet Take 1 tablet (10 mg total) by mouth every 6 (six) hours as needed (Nausea or vomiting). 30 tablet 1  . rizatriptan (MAXALT) 10 MG tablet Take 10 mg by mouth daily as needed for migraine (and may repeat once in 2 hours, if no relief).     Marland Kitchen senna-docusate (SENOKOT-S) 8.6-50 MG tablet Take 1 tablet by mouth 2 (two) times daily. (Patient taking differently: Take 1 tablet by mouth 2 (two) times daily as needed for moderate constipation.  ) 60 tablet 0  . traZODone (DESYREL) 50 MG tablet Take 1 tablet (50 mg total) by mouth at bedtime as needed for sleep. (Patient taking differently: Take 50 mg by mouth at bedtime. ) 90 tablet 1   No current facility-administered medications for this visit.   Facility-Administered Medications Ordered in Other Visits  Medication Dose Route Frequency Provider Last Rate Last Admin  . sodium chloride flush (NS) 0.9 % injection 10 mL  10 mL Intravenous PRN Nicholas Lose, MD   10 mL at 03/01/19 1400    PHYSICAL EXAMINATION: ECOG PERFORMANCE STATUS: 1 - Symptomatic but completely ambulatory  Vitals:   04/17/19 1203  BP: 116/77  Pulse: 88  Resp: 18  Temp: 97.8 F (36.6 C)  SpO2: 97%   Filed Weights   04/17/19 1203  Weight: 162 lb (73.5 kg)    LABORATORY DATA:  I have reviewed the data as listed CMP Latest Ref Rng & Units 04/17/2019 04/11/2019 03/28/2019  Glucose 70 - 99 mg/dL 99 76 108(H)  BUN 6 - 20 mg/dL 21(H) 15 12  Creatinine 0.44 - 1.00 mg/dL 0.74 0.72 0.76  Sodium 135 - 145 mmol/L 137 139 141  Potassium 3.5 - 5.1 mmol/L 3.9 4.0 3.6  Chloride 98 - 111 mmol/L 100 103 105  CO2 22 - 32 mmol/L _0 Calcium 8.9 - 10.3 mg/dL 8.8(L) 8.8(L) 8.5(L)  Total Protein 6.5 - 8.1 g/dL 6.7 7.3 6.9  Total Bilirubin 0.3 - 1.2 mg/dL 0.3 0.3 0.3  Alkaline Phos 38 - 126 U/L 129(H) 184(H) 183(H)  AST 15 - 41 U/L _1 ALT 0 - 44 U/L 43 23 16    Lab Results  Component Value Date   WBC 8.5 04/17/2019   HGB 10.1 (L) 04/17/2019   HCT 31.5 (L) 04/17/2019   MCV 102.3 (H) 04/17/2019   PLT 142 (L) 04/17/2019   NEUTROABS 7.5 04/17/2019    ASSESSMENT & PLAN:  Malignant neoplasm of lower-outer quadrant of left breast of female, estrogen receptor negative (HCC) Mammogram and ultrasound of the left breast revealed 1.7 cm mass at 4:00 position, 6:30 position 5 x 4 x 4 mm mass, 6:00 position 5 cm nipple 7 x 6 x 11 mm, left axillary lymph node with thickened cortex, T1c N1 stage II a AJCC  8  10/24/2016: Left breast biopsy 6:30 position 3 cm from nipple: IDC grade 2, DCIS, ER 0%, PR 0%, Ki-67 15% HER-2 positive ratio 2.1; 4:00 position 3 cm from nipple: IDC grade 2, DCIS, ER 0%, PR 0%, Ki-67 35%, HER-2 positive ratio 2.02 Lymph node biopsy positive  Treatment Summary: 1. Neoadjuvant chemotherapy with TCHPcompleted 02/17/2017 this would be followed by Herceptinand Perjetamaintenance for 1 yearcompleted September 2019 2.Bilateral mastectomies 03/28/2016:Bilateral mastectomies: Left mastectomy: IDC grade 2 0.9 cm, nodes negative, right mastectomy benign, ER 0%, PR 0%, HER-2 positive ratio 2.6 3.Adjuvant radiation4/08/2017 to 06/09/2017 4.Neratinib started 10/12/2017 discontinued due to diarrhea 5. Elbow fracture: Due to metastatic disease, palliative radiation therapy 6. [facility]oplatin atezolizumab [facility] on a clinical trial [facility] 043 stopped for progression 12/04/2018 ---------------------------------------------------------------------------------------------------------------- Lung nodule biopsy: Metastatic breast cancer triple negative  CT CAP 12/03/2018: Progression of lung nodules and bone metastases done at [facility] CTA 01/2019: improvement in lung nodules Current treatment: Sacituzumab-Govitecan days 1 and 8 every 3 weeks with Neulasta on day 9 started 04/11/2019, today is cycle 1 day 8  Toxicities: Denies any nausea or vomiting. Complains of fatigue. Blood count review shows normal white blood cell count and neutrophil counts.  Mild anemia.  Pain issues: I renewed a prescription for methadone. Return to clinic tomorrow for Udenyca injection  She told me that she is going horse shopping to [place]. Follow-up in 2 weeks for cycle 2.    No orders of the defined types were placed in this encounter.  The patient has a good understanding of the overall plan. she agrees with it. she will call with any problems that may develop before the next  visit here.  Total time spent: 30 mins including face to face time and time spent for planning, charting and coordination of care  Nicholas Lose, MD 04/17/2019  I, Cloyde Reams Dorshimer, am acting as scribe for Dr. Nicholas Lose.  I have reviewed the above documentation for accuracy and completeness, and  I agree with the above.

## 2019-04-17 ENCOUNTER — Inpatient Hospital Stay: Payer: 59

## 2019-04-17 ENCOUNTER — Inpatient Hospital Stay (HOSPITAL_BASED_OUTPATIENT_CLINIC_OR_DEPARTMENT_OTHER): Payer: 59 | Admitting: Hematology and Oncology

## 2019-04-17 ENCOUNTER — Other Ambulatory Visit: Payer: Self-pay

## 2019-04-17 VITALS — BP 121/75 | HR 83 | Temp 98.7°F | Resp 16

## 2019-04-17 DIAGNOSIS — C7951 Secondary malignant neoplasm of bone: Secondary | ICD-10-CM

## 2019-04-17 DIAGNOSIS — C50512 Malignant neoplasm of lower-outer quadrant of left female breast: Secondary | ICD-10-CM

## 2019-04-17 DIAGNOSIS — Z171 Estrogen receptor negative status [ER-]: Secondary | ICD-10-CM

## 2019-04-17 DIAGNOSIS — Z7189 Other specified counseling: Secondary | ICD-10-CM

## 2019-04-17 DIAGNOSIS — G893 Neoplasm related pain (acute) (chronic): Secondary | ICD-10-CM

## 2019-04-17 LAB — CMP (CANCER CENTER ONLY)
ALT: 43 U/L (ref 0–44)
AST: 21 U/L (ref 15–41)
Albumin: 3.1 g/dL — ABNORMAL LOW (ref 3.5–5.0)
Alkaline Phosphatase: 129 U/L — ABNORMAL HIGH (ref 38–126)
Anion gap: 8 (ref 5–15)
BUN: 21 mg/dL — ABNORMAL HIGH (ref 6–20)
CO2: 29 mmol/L (ref 22–32)
Calcium: 8.8 mg/dL — ABNORMAL LOW (ref 8.9–10.3)
Chloride: 100 mmol/L (ref 98–111)
Creatinine: 0.74 mg/dL (ref 0.44–1.00)
GFR, Est AFR Am: >60 mL/min (ref >60)
GFR, Estimated: >60 mL/min (ref >60)
Glucose, Bld: 99 mg/dL (ref 70–99)
Potassium: 3.9 mmol/L (ref 3.5–5.1)
Sodium: 137 mmol/L (ref 135–145)
Total Bilirubin: 0.3 mg/dL (ref 0.3–1.2)
Total Protein: 6.7 g/dL (ref 6.5–8.1)

## 2019-04-17 LAB — CBC WITH DIFFERENTIAL (CANCER CENTER ONLY)
Abs Immature Granulocytes: 0.09 10*3/uL — ABNORMAL HIGH (ref 0.00–0.07)
Basophils Absolute: 0 10*3/uL (ref 0.0–0.1)
Basophils Relative: 0 %
Eosinophils Absolute: 0 10*3/uL (ref 0.0–0.5)
Eosinophils Relative: 0 %
HCT: 31.5 % — ABNORMAL LOW (ref 36.0–46.0)
Hemoglobin: 10.1 g/dL — ABNORMAL LOW (ref 12.0–15.0)
Immature Granulocytes: 1 %
Lymphocytes Relative: 4 %
Lymphs Abs: 0.4 10*3/uL — ABNORMAL LOW (ref 0.7–4.0)
MCH: 32.8 pg (ref 26.0–34.0)
MCHC: 32.1 g/dL (ref 30.0–36.0)
MCV: 102.3 fL — ABNORMAL HIGH (ref 80.0–100.0)
Monocytes Absolute: 0.5 10*3/uL (ref 0.1–1.0)
Monocytes Relative: 6 %
Neutro Abs: 7.5 10*3/uL (ref 1.7–7.7)
Neutrophils Relative %: 89 %
Platelet Count: 142 10*3/uL — ABNORMAL LOW (ref 150–400)
RBC: 3.08 MIL/uL — ABNORMAL LOW (ref 3.87–5.11)
RDW: 16.2 % — ABNORMAL HIGH (ref 11.5–15.5)
WBC Count: 8.5 10*3/uL (ref 4.0–10.5)
nRBC: 0 % (ref 0.0–0.2)

## 2019-04-17 LAB — MAGNESIUM: Magnesium: 1.8 mg/dL (ref 1.7–2.4)

## 2019-04-17 LAB — PHOSPHORUS: Phosphorus: 2.8 mg/dL (ref 2.5–4.6)

## 2019-04-17 MED ORDER — ATROPINE SULFATE 1 MG/ML IJ SOLN
0.5000 mg | Freq: Once | INTRAMUSCULAR | Status: AC | PRN
  Administered 2019-04-17: 0.5 mg via INTRAVENOUS

## 2019-04-17 MED ORDER — SODIUM CHLORIDE 0.9% FLUSH
10.0000 mL | INTRAVENOUS | Status: DC | PRN
  Administered 2019-04-17: 10 mL
  Filled 2019-04-17: qty 10

## 2019-04-17 MED ORDER — FAMOTIDINE IN NACL 20-0.9 MG/50ML-% IV SOLN
20.0000 mg | Freq: Once | INTRAVENOUS | Status: AC
  Administered 2019-04-17: 20 mg via INTRAVENOUS

## 2019-04-17 MED ORDER — HEPARIN SOD (PORK) LOCK FLUSH 100 UNIT/ML IV SOLN
500.0000 [IU] | Freq: Once | INTRAVENOUS | Status: AC | PRN
  Administered 2019-04-17: 500 [IU]
  Filled 2019-04-17: qty 5

## 2019-04-17 MED ORDER — ACETAMINOPHEN 325 MG PO TABS
ORAL_TABLET | ORAL | Status: AC
  Filled 2019-04-17: qty 2

## 2019-04-17 MED ORDER — METHADONE HCL 10 MG PO TABS: 10.0000 mg | ORAL_TABLET | Freq: Four times a day (QID) | ORAL | 0 refills | Status: DC | PRN

## 2019-04-17 MED ORDER — FAMOTIDINE IN NACL 20-0.9 MG/50ML-% IV SOLN
INTRAVENOUS | Status: AC
  Filled 2019-04-17: qty 50

## 2019-04-17 MED ORDER — DIPHENHYDRAMINE HCL 50 MG/ML IJ SOLN
25.0000 mg | INTRAMUSCULAR | Status: AC
  Administered 2019-04-17: 25 mg via INTRAVENOUS

## 2019-04-17 MED ORDER — ACETAMINOPHEN 325 MG PO TABS
650.0000 mg | ORAL_TABLET | Freq: Once | ORAL | Status: AC
  Administered 2019-04-17: 650 mg via ORAL

## 2019-04-17 MED ORDER — SODIUM CHLORIDE 0.9 % IV SOLN
8.0000 mg/kg | Freq: Once | INTRAVENOUS | Status: AC
  Administered 2019-04-17: 590 mg via INTRAVENOUS
  Filled 2019-04-17: qty 59

## 2019-04-17 MED ORDER — DEXAMETHASONE SODIUM PHOSPHATE 10 MG/ML IJ SOLN
INTRAMUSCULAR | Status: AC
  Filled 2019-04-17: qty 1

## 2019-04-17 MED ORDER — SODIUM CHLORIDE 0.9 % IV SOLN
Freq: Once | INTRAVENOUS | Status: AC
  Filled 2019-04-17: qty 250

## 2019-04-17 MED ORDER — METHYLPREDNISOLONE SODIUM SUCC 125 MG IJ SOLR
125.0000 mg | INTRAMUSCULAR | Status: AC
  Administered 2019-04-17: 125 mg via INTRAVENOUS

## 2019-04-17 MED ORDER — ATROPINE SULFATE 1 MG/ML IJ SOLN
INTRAMUSCULAR | Status: AC
  Filled 2019-04-17: qty 1

## 2019-04-17 MED ORDER — SODIUM CHLORIDE 0.9 % IV SOLN
150.0000 mg | Freq: Once | INTRAVENOUS | Status: AC
  Administered 2019-04-17: 150 mg via INTRAVENOUS
  Filled 2019-04-17: qty 150

## 2019-04-17 MED ORDER — PALONOSETRON HCL INJECTION 0.25 MG/5ML
0.2500 mg | Freq: Once | INTRAVENOUS | Status: AC
  Administered 2019-04-17: 0.25 mg via INTRAVENOUS

## 2019-04-17 MED ORDER — DIPHENHYDRAMINE HCL 50 MG/ML IJ SOLN
25.0000 mg | Freq: Once | INTRAMUSCULAR | Status: AC
  Administered 2019-04-17: 25 mg via INTRAVENOUS

## 2019-04-17 MED ORDER — DEXAMETHASONE SODIUM PHOSPHATE 10 MG/ML IJ SOLN
10.0000 mg | Freq: Once | INTRAMUSCULAR | Status: AC
  Administered 2019-04-17: 10 mg via INTRAVENOUS

## 2019-04-17 MED ORDER — DIPHENHYDRAMINE HCL 50 MG/ML IJ SOLN
INTRAMUSCULAR | Status: AC
  Filled 2019-04-17: qty 1

## 2019-04-17 MED ORDER — PALONOSETRON HCL INJECTION 0.25 MG/5ML
INTRAVENOUS | Status: AC
  Filled 2019-04-17: qty 5

## 2019-04-17 NOTE — Assessment & Plan Note (Signed)
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 Neoadjuvant chemotherapy with TCHPcompleted 02/17/2017 this would be followed by Herceptinand Perjetamaintenance for 1 yearcompleted September 2019 2.Bilateral mastectomies 03/28/2016:Bilateral mastectomies: Left mastectomy: IDC grade 2 0.9 cm, nodes negative, right mastectomy benign, ER 0%, PR 0%, HER-2 positive ratio 2.6 3.Adjuvant radiation4/08/2017 to 06/09/2017 4.Neratinib started 10/12/2017 discontinued due to diarrhea 5. Elbow fracture: Due to metastatic disease, palliative radiation therapy 6. Carboplatin atezolizumab at [facility] on a clinical trial [facility] 043 stopped for progression 12/04/2018 ---------------------------------------------------------------------------------------------------------------- Lung nodule biopsy: Metastatic breast cancer triple negative  CT CAP 12/03/2018: Progression of lung nodules and bone metastases done at [facility] CTA 01/2019: improvement in lung nodules Current treatment: Sacituzumab-Govitecan days 1 and 8 every 3 weeks with Neulasta on day 9 started 04/11/2019, today is cycle 1 day 8  Toxicities:  Return to clinic tomorrow for Udenyca injection Follow-up in 2 weeks for cycle 2. 

## 2019-04-17 NOTE — Progress Notes (Signed)
Near the end of Trodelvy infusion, patient began c/o sore throat, cough, and "feeling real bad." Trodelvy infusion immediately stopped. Patient exhibited facial palor with erythematous neck and lips. Noted hoarse voice. Patient denied dyspnea. Hypersensitivity protocol initiated and Sandi Mealy, PA-C came to infusion room to evaluate. Medicated as documented in [facility]. Patient verbalized rapid improvement in symptoms and color began improving. Care turned over to Katheren Puller, Therapist, sports.

## 2019-04-17 NOTE — Progress Notes (Signed)
Trodelvy infusion resumed after patient returning to baseline, after 10 min post emergency medications.  Patient tolerated remaining infusion with no complications.  VS remained stable throughout remaining infusion, and post observation.  Pt denies any SHOB, voice returned to baseline, no erythema present.  Patient discharged in no distress.

## 2019-04-17 NOTE — Patient Instructions (Addendum)
[facility] Discharge Instructions for Patients Receiving Chemotherapy  Today you received the following chemotherapy agent: Sacituzumab (TRODELVY)  To help prevent nausea and vomiting after your treatment, we encourage you to take your nausea medication as directed by your MD.   If you develop nausea and vomiting that is not controlled by your nausea medication, call the clinic.   BELOW ARE SYMPTOMS THAT SHOULD BE REPORTED IMMEDIATELY:  *FEVER GREATER THAN 100.5 F  *CHILLS WITH OR WITHOUT FEVER  NAUSEA AND VOMITING THAT IS NOT CONTROLLED WITH YOUR NAUSEA MEDICATION  *UNUSUAL SHORTNESS OF BREATH  *UNUSUAL BRUISING OR BLEEDING  TENDERNESS IN MOUTH AND THROAT WITH OR WITHOUT PRESENCE OF ULCERS  *URINARY PROBLEMS  *BOWEL PROBLEMS  UNUSUAL RASH Items with * indicate a potential emergency and should be followed up as soon as possible.  Feel free to call the clinic should you have any questions or concerns. The clinic phone number is [phone].  Please show the [facility] at check-in to the Emergency Department and triage nurse.  Coronavirus (COVID-19) Are you at risk?  Are you at risk for the Coronavirus (COVID-19)?  To be considered HIGH RISK for Coronavirus (COVID-19), you have to meet the following criteria:  . Traveled to [place], [place], [place], [place] or [place]; or in the [place] to [place], [place], [place], or [place]; and have fever, cough, and shortness of breath within the last 2 weeks of travel OR . Been in close contact with a person diagnosed with COVID-19 within the last 2 weeks and have fever, cough, and shortness of breath . IF YOU DO NOT MEET THESE CRITERIA, YOU ARE CONSIDERED LOW RISK FOR COVID-19.  What to do if you are HIGH RISK for COVID-19?  Marland Kitchen If you are having a medical emergency, call 911. . Seek medical care right away. Before you go to a doctor's office, urgent care or emergency department, call ahead and  tell them about your recent travel, contact with someone diagnosed with COVID-19, and your symptoms. You should receive instructions from your physician's office regarding next steps of care.  . When you arrive at healthcare provider, tell the healthcare staff immediately you have returned from visiting [place], [place], [place], [place] or [place]; or traveled in the [place] to [place], [place], [place], or [place]; in the last two weeks or you have been in close contact with a person diagnosed with COVID-19 in the last 2 weeks.   . Tell the health care staff about your symptoms: fever, cough and shortness of breath. . After you have been seen by a medical provider, you will be either: o Tested for (COVID-19) and discharged home on quarantine except to seek medical care if symptoms worsen, and asked to  - Stay home and avoid contact with others until you get your results (4-5 days)  - Avoid travel on public transportation if possible (such as bus, train, or airplane) or o Sent to the Emergency Department by EMS for evaluation, COVID-19 testing, and possible admission depending on your condition and test results.  What to do if you are LOW RISK for COVID-19?  Reduce your risk of any infection by using the same precautions used for avoiding the common cold or flu:  Marland Kitchen Wash your hands often with soap and warm water for at least 20 seconds.  If soap and water are not readily available, use an alcohol-based hand sanitizer with at least 60% alcohol.  Marland Kitchen  If coughing or sneezing, cover your mouth and nose by coughing or sneezing into the elbow areas of your shirt or coat, into a tissue or into your sleeve (not your hands). . Avoid shaking hands with others and consider head nods or verbal greetings only. . Avoid touching your eyes, nose, or mouth with unwashed hands.  . Avoid close contact with people who are sick. . Avoid places or events with large numbers of people in one location, like  concerts or sporting events. . Carefully consider travel plans you have or are making. . If you are planning any travel outside or inside the [place], visit the CDC's Travelers' Health webpage for the latest health notices. . If you have some symptoms but not all symptoms, continue to monitor at home and seek medical attention if your symptoms worsen. . If you are having a medical emergency, call 911.   ADDITIONAL HEALTHCARE OPTIONS FOR PATIENTS  [facility] / e-Visit: [email or web address]         [facility]: [phone]  [facility]: [phone]                   [facility]: [phone]  

## 2019-04-18 ENCOUNTER — Inpatient Hospital Stay: Payer: 59

## 2019-04-18 ENCOUNTER — Other Ambulatory Visit: Payer: Self-pay | Admitting: Hematology and Oncology

## 2019-04-18 ENCOUNTER — Ambulatory Visit (HOSPITAL_BASED_OUTPATIENT_CLINIC_OR_DEPARTMENT_OTHER): Payer: 59 | Admitting: Medical

## 2019-04-18 VITALS — BP 126/89 | HR 74 | Temp 98.5°F | Resp 18

## 2019-04-18 DIAGNOSIS — C7951 Secondary malignant neoplasm of bone: Secondary | ICD-10-CM

## 2019-04-18 DIAGNOSIS — C50512 Malignant neoplasm of lower-outer quadrant of left female breast: Secondary | ICD-10-CM

## 2019-04-18 DIAGNOSIS — T8090XA Unspecified complication following infusion and therapeutic injection, initial encounter: Secondary | ICD-10-CM

## 2019-04-18 DIAGNOSIS — Z171 Estrogen receptor negative status [ER-]: Secondary | ICD-10-CM

## 2019-04-18 DIAGNOSIS — Z7189 Other specified counseling: Secondary | ICD-10-CM

## 2019-04-18 MED ORDER — PEGFILGRASTIM INJECTION 6 MG/0.6ML ~~LOC~~
6.0000 mg | PREFILLED_SYRINGE | Freq: Once | SUBCUTANEOUS | Status: AC
  Administered 2019-04-18: 6 mg via SUBCUTANEOUS

## 2019-04-18 MED ORDER — PEGFILGRASTIM INJECTION 6 MG/0.6ML ~~LOC~~
PREFILLED_SYRINGE | SUBCUTANEOUS | Status: AC
  Filled 2019-04-18: qty 0.6

## 2019-04-18 NOTE — Patient Instructions (Signed)
Pegfilgrastim injection What is this medicine? PEGFILGRASTIM (PEG fil gra stim) is a long-acting granulocyte colony-stimulating factor that stimulates the growth of neutrophils, a type of white blood cell important in the body's fight against infection. It is used to reduce the incidence of fever and infection in patients with certain types of cancer who are receiving chemotherapy that affects the bone marrow, and to increase survival after being exposed to high doses of radiation. This medicine may be used for other purposes; ask your health care provider or pharmacist if you have questions. COMMON BRAND NAME(S): Fulphila, Neulasta, UDENYCA, Ziextenzo What should I tell my health care provider before I take this medicine? They need to know if you have any of these conditions:  kidney disease  latex allergy  ongoing radiation therapy  sickle cell disease  skin reactions to acrylic adhesives (On-Body Injector only)  an unusual or allergic reaction to pegfilgrastim, filgrastim, other medicines, foods, dyes, or preservatives  pregnant or trying to get pregnant  breast-feeding How should I use this medicine? This medicine is for injection under the skin. If you get this medicine at home, you will be taught how to prepare and give the pre-filled syringe or how to use the On-body Injector. Refer to the patient Instructions for Use for detailed instructions. Use exactly as directed. Tell your healthcare provider immediately if you suspect that the On-body Injector may not have performed as intended or if you suspect the use of the On-body Injector resulted in a missed or partial dose. It is important that you put your used needles and syringes in a special sharps container. Do not put them in a trash can. If you do not have a sharps container, call your pharmacist or healthcare provider to get one. Talk to your pediatrician regarding the use of this medicine in children. While this drug may be  prescribed for selected conditions, precautions do apply. Overdosage: If you think you have taken too much of this medicine contact a poison control center or emergency room at once. NOTE: This medicine is only for you. Do not share this medicine with others. What if I miss a dose? It is important not to miss your dose. Call your doctor or health care professional if you miss your dose. If you miss a dose due to an On-body Injector failure or leakage, a new dose should be administered as soon as possible using a single prefilled syringe for manual use. What may interact with this medicine? Interactions have not been studied. Give your health care provider a list of all the medicines, herbs, non-prescription drugs, or dietary supplements you use. Also tell them if you smoke, drink alcohol, or use illegal drugs. Some items may interact with your medicine. This list may not describe all possible interactions. Give your health care provider a list of all the medicines, herbs, non-prescription drugs, or dietary supplements you use. Also tell them if you smoke, drink alcohol, or use illegal drugs. Some items may interact with your medicine. What should I watch for while using this medicine? You may need blood work done while you are taking this medicine. If you are going to need a MRI, CT scan, or other procedure, tell your doctor that you are using this medicine (On-Body Injector only). What side effects may I notice from receiving this medicine? Side effects that you should report to your doctor or health care professional as soon as possible:  allergic reactions like skin rash, itching or hives, swelling of the   face, lips, or tongue  back pain  dizziness  fever  pain, redness, or irritation at site where injected  pinpoint red spots on the skin  red or dark-brown urine  shortness of breath or breathing problems  stomach or side pain, or pain at the  shoulder  swelling  tiredness  trouble passing urine or change in the amount of urine Side effects that usually do not require medical attention (report to your doctor or health care professional if they continue or are bothersome):  bone pain  muscle pain This list may not describe all possible side effects. Call your doctor for medical advice about side effects. You may report side effects to FDA at 1-800-FDA-1088. Where should I keep my medicine? Keep out of the reach of children. If you are using this medicine at home, you will be instructed on how to store it. Throw away any unused medicine after the expiration date on the label. NOTE: This sheet is a summary. It may not cover all possible information. If you have questions about this medicine, talk to your doctor, pharmacist, or health care provider.  2020 Elsevier/Gold Standard (2017-04-24 16:57:08)  Coronavirus (COVID-19) Are you at risk?  Are you at risk for the Coronavirus (COVID-19)?  To be considered HIGH RISK for Coronavirus (COVID-19), you have to meet the following criteria:  . Traveled to [place], [place], [place], [place] or [place]; or in the [place] to [place], [place], [place], or [place]; and have fever, cough, and shortness of breath within the last 2 weeks of travel OR . Been in close contact with a person diagnosed with COVID-19 within the last 2 weeks and have fever, cough, and shortness of breath . IF YOU DO NOT MEET THESE CRITERIA, YOU ARE CONSIDERED LOW RISK FOR COVID-19.  What to do if you are HIGH RISK for COVID-19?  Marland Kitchen If you are having a medical emergency, call 911. . Seek medical care right away. Before you go to a doctor's office, urgent care or emergency department, call ahead and tell them about your recent travel, contact with someone diagnosed with COVID-19, and your symptoms. You should receive instructions from your physician's office regarding next steps of care.  . When you arrive  at healthcare provider, tell the healthcare staff immediately you have returned from visiting [place], [place], [place], [place] or [place]; or traveled in the [place] to [place], [place], [place], or [place]; in the last two weeks or you have been in close contact with a person diagnosed with COVID-19 in the last 2 weeks.   . Tell the health care staff about your symptoms: fever, cough and shortness of breath. . After you have been seen by a medical provider, you will be either: o Tested for (COVID-19) and discharged home on quarantine except to seek medical care if symptoms worsen, and asked to  - Stay home and avoid contact with others until you get your results (4-5 days)  - Avoid travel on public transportation if possible (such as bus, train, or airplane) or o Sent to the Emergency Department by EMS for evaluation, COVID-19 testing, and possible admission depending on your condition and test results.  What to do if you are LOW RISK for COVID-19?  Reduce your risk of any infection by using the same precautions used for avoiding the common cold or flu:  Marland Kitchen Wash your hands often with soap and warm water for at least 20 seconds.  If soap  and water are not readily available, use an alcohol-based hand sanitizer with at least 60% alcohol.  . If coughing or sneezing, cover your mouth and nose by coughing or sneezing into the elbow areas of your shirt or coat, into a tissue or into your sleeve (not your hands). . Avoid shaking hands with others and consider head nods or verbal greetings only. . Avoid touching your eyes, nose, or mouth with unwashed hands.  . Avoid close contact with people who are sick. . Avoid places or events with large numbers of people in one location, like concerts or sporting events. . Carefully consider travel plans you have or are making. . If you are planning any travel outside or inside the [place], visit the CDC's Travelers' Health webpage for the latest health  notices. . If you have some symptoms but not all symptoms, continue to monitor at home and seek medical attention if your symptoms worsen. . If you are having a medical emergency, call 911.   [facility] / e-Visit: [email or web address]         [facility]: [facility]: [id number]                   [facility]: [phone]

## 2019-04-19 ENCOUNTER — Ambulatory Visit: Payer: 59 | Admitting: Hematology and Oncology

## 2019-04-19 ENCOUNTER — Other Ambulatory Visit: Payer: 59

## 2019-04-19 ENCOUNTER — Ambulatory Visit: Payer: 59

## 2019-04-19 ENCOUNTER — Inpatient Hospital Stay: Payer: 59

## 2019-04-19 MED ORDER — PANTOPRAZOLE SODIUM 40 MG PO TBEC: 40.0000 mg | DELAYED_RELEASE_TABLET | Freq: Two times a day (BID) | ORAL | 0 refills | Status: DC

## 2019-04-19 NOTE — Progress Notes (Signed)
    DATE:  04/17/2019                                        X  CHEMO/IMMUNOTHERAPY REACTION            MD:  Dr. Nicholas Lose   AGENT/BLOOD PRODUCT RECEIVING TODAY:         Laura Anthony                  AGENT/BLOOD PRODUCT RECEIVING IMMEDIATELY PRIOR TO REACTION:      Trodelvy         VS: BP:      141/92   P:        85       SPO2:        100% on room air  T: 99                BP:      116/74   P:        81       SPO2:        100% on room air     REACTION(S):          Sore throat, lightheadedness, and facial erythema   PREMEDS:       Aloxi, Tylenol, Benadryl 25 mg, dexamethasone, Emend, Pepcid 20 mg, and atropine    INTERVENTION: Pepcid 20 mg IV x1 and Solu-Medrol 125 mg IV x1   Review of Systems  Review of Systems  Constitutional: Negative for chills, diaphoresis and fever.  HENT: Positive for sore throat. Negative for trouble swallowing and voice change.   Respiratory: Negative for cough, chest tightness, shortness of breath and wheezing.   Cardiovascular: Negative for chest pain and palpitations.  Gastrointestinal: Negative for abdominal pain, constipation, diarrhea, nausea and vomiting.  Musculoskeletal: Negative for back pain and myalgias.  Skin:       Facial erythema  Neurological: Positive for light-headedness. Negative for dizziness and headaches.     Physical Exam  Physical Exam Constitutional:      General: She is not in acute distress.    Appearance: She is not diaphoretic.  HENT:     Head: Normocephalic and atraumatic.  Cardiovascular:     Rate and Rhythm: Normal rate and regular rhythm.     Heart sounds: Normal heart sounds. No murmur. No friction rub. No gallop.   Pulmonary:     Effort: Pulmonary effort is normal. No respiratory distress.     Breath sounds: Normal breath sounds. No wheezing or rales.  Skin:    General: Skin is warm and dry.     Findings: Erythema (Mild facial erythema) present. No rash.  Neurological:     Mental Status: She is alert.   Comments: Hoarseness     OUTCOME:                 The patient's symptoms resolved after she was dosed with Pepcid 20 mg IV and Solu-Medrol 125 mg IV.  She was able to restart and complete her infusion of Trodelvy.   Sandi Mealy, MHS, PA-C

## 2019-04-20 ENCOUNTER — Ambulatory Visit: Payer: 59

## 2019-04-24 ENCOUNTER — Telehealth: Payer: Self-pay | Admitting: *Deleted

## 2019-04-24 ENCOUNTER — Ambulatory Visit (HOSPITAL_COMMUNITY): Payer: 59

## 2019-04-24 ENCOUNTER — Other Ambulatory Visit: Payer: Self-pay | Admitting: *Deleted

## 2019-04-24 MED ORDER — LEVOFLOXACIN 500 MG PO TABS: 500.0000 mg | ORAL_TABLET | Freq: Every day | ORAL | 0 refills | Status: DC

## 2019-04-24 MED ORDER — FLUCONAZOLE 100 MG PO TABS: 100.0000 mg | ORAL_TABLET | Freq: Every day | ORAL | 0 refills | Status: DC

## 2019-04-24 NOTE — Progress Notes (Signed)
Patient requested Onpro for 4/29 date of treatment only. All other days will remain as the D9 Neulasta injection per patient request.   Demetrius Charity, PharmD, [place], [facility] Pharmacist Pharmacy Phone: [phone] 04/24/2019

## 2019-04-24 NOTE — Telephone Encounter (Signed)
Received call from pt with c/o mouth sores on gums and tongue that have not been alleviated with magic mouth wash and with pain to left ear and sinus with yellow mucous when she blows her nose. Per Dr. Lindi Adie sent prescription in for diflucan and levoqin. Gave pt instructions.

## 2019-04-25 ENCOUNTER — Encounter: Payer: Self-pay | Admitting: *Deleted

## 2019-04-25 ENCOUNTER — Ambulatory Visit: Payer: 59

## 2019-04-25 ENCOUNTER — Ambulatory Visit: Payer: 59 | Admitting: Hematology and Oncology

## 2019-04-25 ENCOUNTER — Other Ambulatory Visit: Payer: 59

## 2019-05-01 NOTE — Telephone Encounter (Signed)
Thanks Melanie- Dr. Lindi Adie recently filled her medication. I think this is an old request.

## 2019-05-01 NOTE — Telephone Encounter (Signed)
Laura Anthony,  Sending to you as it looks like you increased the dose.  Thanks! Threasa Beards

## 2019-05-02 ENCOUNTER — Inpatient Hospital Stay: Payer: 59

## 2019-05-02 ENCOUNTER — Other Ambulatory Visit: Payer: Self-pay | Admitting: Medical

## 2019-05-02 ENCOUNTER — Inpatient Hospital Stay: Payer: 59 | Attending: Hematology and Oncology

## 2019-05-02 ENCOUNTER — Inpatient Hospital Stay (HOSPITAL_BASED_OUTPATIENT_CLINIC_OR_DEPARTMENT_OTHER): Payer: 59 | Admitting: Medical

## 2019-05-02 ENCOUNTER — Encounter: Payer: Self-pay | Admitting: *Deleted

## 2019-05-02 ENCOUNTER — Other Ambulatory Visit: Payer: Self-pay

## 2019-05-02 VITALS — BP 112/79 | HR 81 | Temp 98.9°F | Resp 16

## 2019-05-02 DIAGNOSIS — C50512 Malignant neoplasm of lower-outer quadrant of left female breast: Secondary | ICD-10-CM | POA: Diagnosis not present

## 2019-05-02 DIAGNOSIS — G893 Neoplasm related pain (acute) (chronic): Secondary | ICD-10-CM

## 2019-05-02 DIAGNOSIS — Z9013 Acquired absence of bilateral breasts and nipples: Secondary | ICD-10-CM | POA: Diagnosis not present

## 2019-05-02 DIAGNOSIS — C7951 Secondary malignant neoplasm of bone: Secondary | ICD-10-CM

## 2019-05-02 DIAGNOSIS — Z171 Estrogen receptor negative status [ER-]: Secondary | ICD-10-CM

## 2019-05-02 DIAGNOSIS — D696 Thrombocytopenia, unspecified: Secondary | ICD-10-CM | POA: Insufficient documentation

## 2019-05-02 DIAGNOSIS — R490 Dysphonia: Secondary | ICD-10-CM | POA: Insufficient documentation

## 2019-05-02 DIAGNOSIS — R5383 Other fatigue: Secondary | ICD-10-CM | POA: Insufficient documentation

## 2019-05-02 DIAGNOSIS — Z95828 Presence of other vascular implants and grafts: Secondary | ICD-10-CM

## 2019-05-02 DIAGNOSIS — Z923 Personal history of irradiation: Secondary | ICD-10-CM | POA: Diagnosis not present

## 2019-05-02 DIAGNOSIS — T8090XA Unspecified complication following infusion and therapeutic injection, initial encounter: Secondary | ICD-10-CM

## 2019-05-02 DIAGNOSIS — Z5111 Encounter for antineoplastic chemotherapy: Secondary | ICD-10-CM | POA: Diagnosis not present

## 2019-05-02 DIAGNOSIS — Z7189 Other specified counseling: Secondary | ICD-10-CM

## 2019-05-02 LAB — CMP (CANCER CENTER ONLY)
ALT: 23 U/L (ref 0–44)
AST: 19 U/L (ref 15–41)
Albumin: 2.9 g/dL — ABNORMAL LOW (ref 3.5–5.0)
Alkaline Phosphatase: 140 U/L — ABNORMAL HIGH (ref 38–126)
Anion gap: 11 (ref 5–15)
BUN: 9 mg/dL (ref 6–20)
CO2: 27 mmol/L (ref 22–32)
Calcium: 8.8 mg/dL — ABNORMAL LOW (ref 8.9–10.3)
Chloride: 105 mmol/L (ref 98–111)
Creatinine: 0.7 mg/dL (ref 0.44–1.00)
GFR, Est AFR Am: >60 mL/min (ref >60)
GFR, Estimated: >60 mL/min (ref >60)
Glucose, Bld: 92 mg/dL (ref 70–99)
Potassium: 3.6 mmol/L (ref 3.5–5.1)
Sodium: 143 mmol/L (ref 135–145)
Total Bilirubin: 0.2 mg/dL — ABNORMAL LOW (ref 0.3–1.2)
Total Protein: 6.7 g/dL (ref 6.5–8.1)

## 2019-05-02 LAB — MAGNESIUM: Magnesium: 2 mg/dL (ref 1.7–2.4)

## 2019-05-02 LAB — CBC WITH DIFFERENTIAL (CANCER CENTER ONLY)
Abs Immature Granulocytes: 0.02 10*3/uL (ref 0.00–0.07)
Basophils Absolute: 0 10*3/uL (ref 0.0–0.1)
Basophils Relative: 1 %
Eosinophils Absolute: 0.1 10*3/uL (ref 0.0–0.5)
Eosinophils Relative: 2 %
HCT: 29.6 % — ABNORMAL LOW (ref 36.0–46.0)
Hemoglobin: 9.3 g/dL — ABNORMAL LOW (ref 12.0–15.0)
Immature Granulocytes: 1 %
Lymphocytes Relative: 15 %
Lymphs Abs: 0.6 10*3/uL — ABNORMAL LOW (ref 0.7–4.0)
MCH: 32.1 pg (ref 26.0–34.0)
MCHC: 31.4 g/dL (ref 30.0–36.0)
MCV: 102.1 fL — ABNORMAL HIGH (ref 80.0–100.0)
Monocytes Absolute: 0.5 10*3/uL (ref 0.1–1.0)
Monocytes Relative: 12 %
Neutro Abs: 2.8 10*3/uL (ref 1.7–7.7)
Neutrophils Relative %: 69 %
Platelet Count: 112 10*3/uL — ABNORMAL LOW (ref 150–400)
RBC: 2.9 MIL/uL — ABNORMAL LOW (ref 3.87–5.11)
RDW: 17.4 % — ABNORMAL HIGH (ref 11.5–15.5)
WBC Count: 4 10*3/uL (ref 4.0–10.5)
nRBC: 0 % (ref 0.0–0.2)

## 2019-05-02 LAB — PHOSPHORUS: Phosphorus: 3.7 mg/dL (ref 2.5–4.6)

## 2019-05-02 MED ORDER — DIPHENHYDRAMINE HCL 50 MG/ML IJ SOLN
25.0000 mg | Freq: Once | INTRAMUSCULAR | Status: AC
  Administered 2019-05-02: 25 mg via INTRAVENOUS

## 2019-05-02 MED ORDER — ACETAMINOPHEN 325 MG PO TABS
ORAL_TABLET | ORAL | Status: AC
  Filled 2019-05-02: qty 2

## 2019-05-02 MED ORDER — SODIUM CHLORIDE 0.9 % IV SOLN
150.0000 mg | Freq: Once | INTRAVENOUS | Status: AC
  Administered 2019-05-02: 150 mg via INTRAVENOUS
  Filled 2019-05-02: qty 150

## 2019-05-02 MED ORDER — DIPHENHYDRAMINE HCL 50 MG/ML IJ SOLN
INTRAMUSCULAR | Status: AC
  Filled 2019-05-02: qty 1

## 2019-05-02 MED ORDER — ZOLPIDEM TARTRATE ER 6.25 MG PO TBCR: 6.2500 mg | EXTENDED_RELEASE_TABLET | Freq: Every evening | ORAL | 2 refills | Status: DC | PRN

## 2019-05-02 MED ORDER — SODIUM CHLORIDE 0.9% FLUSH
10.0000 mL | INTRAVENOUS | Status: DC | PRN
  Administered 2019-05-02: 10 mL
  Filled 2019-05-02: qty 10

## 2019-05-02 MED ORDER — METHYLPREDNISOLONE SODIUM SUCC 125 MG IJ SOLR
60.0000 mg | Freq: Once | INTRAMUSCULAR | Status: AC
  Administered 2019-05-02: 60 mg via INTRAVENOUS

## 2019-05-02 MED ORDER — FAMOTIDINE IN NACL 20-0.9 MG/50ML-% IV SOLN
20.0000 mg | Freq: Once | INTRAVENOUS | Status: AC
  Administered 2019-05-02: 20 mg via INTRAVENOUS

## 2019-05-02 MED ORDER — PALONOSETRON HCL INJECTION 0.25 MG/5ML
0.2500 mg | Freq: Once | INTRAVENOUS | Status: AC
  Administered 2019-05-02: 0.25 mg via INTRAVENOUS

## 2019-05-02 MED ORDER — PALONOSETRON HCL INJECTION 0.25 MG/5ML
INTRAVENOUS | Status: AC
  Filled 2019-05-02: qty 5

## 2019-05-02 MED ORDER — HEPARIN SOD (PORK) LOCK FLUSH 100 UNIT/ML IV SOLN
500.0000 [IU] | Freq: Once | INTRAVENOUS | Status: AC | PRN
  Administered 2019-05-02: 500 [IU]
  Filled 2019-05-02: qty 5

## 2019-05-02 MED ORDER — DEXAMETHASONE SODIUM PHOSPHATE 10 MG/ML IJ SOLN
INTRAMUSCULAR | Status: AC
  Filled 2019-05-02: qty 1

## 2019-05-02 MED ORDER — FAMOTIDINE IN NACL 20-0.9 MG/50ML-% IV SOLN
INTRAVENOUS | Status: AC
  Filled 2019-05-02: qty 50

## 2019-05-02 MED ORDER — SODIUM CHLORIDE 0.9 % IV SOLN
Freq: Once | INTRAVENOUS | Status: AC
  Filled 2019-05-02: qty 250

## 2019-05-02 MED ORDER — SODIUM CHLORIDE 0.9 % IV SOLN
8.0000 mg/kg | Freq: Once | INTRAVENOUS | Status: AC
  Administered 2019-05-02: 590 mg via INTRAVENOUS
  Filled 2019-05-02: qty 59

## 2019-05-02 MED ORDER — ATROPINE SULFATE 1 MG/ML IJ SOLN
0.5000 mg | Freq: Once | INTRAMUSCULAR | Status: AC | PRN
  Administered 2019-05-02: 0.5 mg via INTRAVENOUS

## 2019-05-02 MED ORDER — SODIUM CHLORIDE 0.9% FLUSH
10.0000 mL | INTRAVENOUS | Status: DC | PRN
  Administered 2019-05-02: 10 mL via INTRAVENOUS
  Filled 2019-05-02: qty 10

## 2019-05-02 MED ORDER — ACETAMINOPHEN 325 MG PO TABS
650.0000 mg | ORAL_TABLET | Freq: Once | ORAL | Status: AC
  Administered 2019-05-02: 650 mg via ORAL

## 2019-05-02 MED ORDER — DEXAMETHASONE SODIUM PHOSPHATE 10 MG/ML IJ SOLN
10.0000 mg | Freq: Once | INTRAMUSCULAR | Status: AC
  Administered 2019-05-02: 10 mg via INTRAVENOUS

## 2019-05-02 MED ORDER — ATROPINE SULFATE 1 MG/ML IJ SOLN
INTRAMUSCULAR | Status: AC
  Filled 2019-05-02: qty 1

## 2019-05-02 NOTE — Progress Notes (Signed)
Georgette Dover, RN notified by patient of scratchy throat @ 1311.  Infusion paused, hypersensitivity protocol initiated.  Pepcid, and Solu Medrol given per MAR.  Sandi Mealy, PA-C assessed patient in infusion.   VS obtained via flow sheets.  Pt's symptoms subsided after emergency medications administered.  Pt remained A&O X 4.  Pt observed for 15 min post emergency medication, infusion resumed.  Pt able to tolerate remaining infusion without any complications.    During 30 min post observation, patient with hoarse voice, Linna Caprice present during this.  Denies any shortness of breath, chest pain.   Recommend patient to take Benadryl after discharge.  Pt verbalized understanding and agreement.

## 2019-05-02 NOTE — Patient Instructions (Signed)
[facility] Discharge Instructions for Patients Receiving Chemotherapy  Today you received the following chemotherapy agent: Sacituzumab (TRODELVY)  To help prevent nausea and vomiting after your treatment, we encourage you to take your nausea medication as directed by your MD.   If you develop nausea and vomiting that is not controlled by your nausea medication, call the clinic.   BELOW ARE SYMPTOMS THAT SHOULD BE REPORTED IMMEDIATELY:  *FEVER GREATER THAN 100.5 F  *CHILLS WITH OR WITHOUT FEVER  NAUSEA AND VOMITING THAT IS NOT CONTROLLED WITH YOUR NAUSEA MEDICATION  *UNUSUAL SHORTNESS OF BREATH  *UNUSUAL BRUISING OR BLEEDING  TENDERNESS IN MOUTH AND THROAT WITH OR WITHOUT PRESENCE OF ULCERS  *URINARY PROBLEMS  *BOWEL PROBLEMS  UNUSUAL RASH Items with * indicate a potential emergency and should be followed up as soon as possible.  Feel free to call the clinic should you have any questions or concerns. The clinic phone number is [phone].  Please show the [facility] at check-in to the Emergency Department and triage nurse.  Coronavirus (COVID-19) Are you at risk?  Are you at risk for the Coronavirus (COVID-19)?  To be considered HIGH RISK for Coronavirus (COVID-19), you have to meet the following criteria:  . Traveled to [place], [place], [place], [place] or [place]; or in the [place] to [place], [place], [place], or [place]; and have fever, cough, and shortness of breath within the last 2 weeks of travel OR . Been in close contact with a person diagnosed with COVID-19 within the last 2 weeks and have fever, cough, and shortness of breath . IF YOU DO NOT MEET THESE CRITERIA, YOU ARE CONSIDERED LOW RISK FOR COVID-19.  What to do if you are HIGH RISK for COVID-19?  Marland Kitchen If you are having a medical emergency, call 911. . Seek medical care right away. Before you go to a doctor's office, urgent care or emergency department, call ahead and  tell them about your recent travel, contact with someone diagnosed with COVID-19, and your symptoms. You should receive instructions from your physician's office regarding next steps of care.  . When you arrive at healthcare provider, tell the healthcare staff immediately you have returned from visiting [place], [place], [place], [place] or [place]; or traveled in the [place] to [place], [place], [place], or [place]; in the last two weeks or you have been in close contact with a person diagnosed with COVID-19 in the last 2 weeks.   . Tell the health care staff about your symptoms: fever, cough and shortness of breath. . After you have been seen by a medical provider, you will be either: o Tested for (COVID-19) and discharged home on quarantine except to seek medical care if symptoms worsen, and asked to  - Stay home and avoid contact with others until you get your results (4-5 days)  - Avoid travel on public transportation if possible (such as bus, train, or airplane) or o Sent to the Emergency Department by EMS for evaluation, COVID-19 testing, and possible admission depending on your condition and test results.  What to do if you are LOW RISK for COVID-19?  Reduce your risk of any infection by using the same precautions used for avoiding the common cold or flu:  Marland Kitchen Wash your hands often with soap and warm water for at least 20 seconds.  If soap and water are not readily available, use an alcohol-based hand sanitizer with at least 60% alcohol.  Marland Kitchen  If coughing or sneezing, cover your mouth and nose by coughing or sneezing into the elbow areas of your shirt or coat, into a tissue or into your sleeve (not your hands). . Avoid shaking hands with others and consider head nods or verbal greetings only. . Avoid touching your eyes, nose, or mouth with unwashed hands.  . Avoid close contact with people who are sick. . Avoid places or events with large numbers of people in one location, like  concerts or sporting events. . Carefully consider travel plans you have or are making. . If you are planning any travel outside or inside the [place], visit the CDC's Travelers' Health webpage for the latest health notices. . If you have some symptoms but not all symptoms, continue to monitor at home and seek medical attention if your symptoms worsen. . If you are having a medical emergency, call 911.   ADDITIONAL HEALTHCARE OPTIONS FOR PATIENTS  [facility] / e-Visit: [email or web address]         [facility]: [phone]  [facility]: [phone]                   [facility]: [phone]  

## 2019-05-02 NOTE — Patient Instructions (Signed)

## 2019-05-03 NOTE — Progress Notes (Signed)

## 2019-05-03 NOTE — Progress Notes (Signed)
    DATE:  05/02/2019                                          X  CHEMO/IMMUNOTHERAPY REACTION           MD:  Dr. Nicholas Lose   AGENT/BLOOD PRODUCT RECEIVING TODAY:              Laura Anthony   AGENT/BLOOD PRODUCT RECEIVING IMMEDIATELY PRIOR TO REACTION:          Laura Anthony   VS: BP:     112/79   P:       89       SPO2:       98 % on room air                  REACTION(S):           Hoarseness   PREMEDS:     Aloxi, Benadryl 25 mg, dexamethasone, Emend, Pepcid, and atropine    INTERVENTION: Pepcid 20 mg IV and solumedrol 60 mg IV x 1   Review of Systems  Review of Systems  Constitutional: Negative for chills, diaphoresis and fever.  HENT: Negative for trouble swallowing and voice change.   Respiratory: Negative for cough, chest tightness, shortness of breath and wheezing.   Cardiovascular: Negative for chest pain and palpitations.  Gastrointestinal: Negative for abdominal pain, constipation, diarrhea, nausea and vomiting.  Musculoskeletal: Negative for back pain and myalgias.  Neurological: Positive for speech difficulty (hoarseness). Negative for dizziness, light-headedness and headaches.     Physical Exam  Physical Exam Constitutional:      General: Laura Anthony is not in acute distress.    Appearance: Laura Anthony is not diaphoretic.     Comments: Patient's voice was hoarse.   HENT:     Head: Normocephalic and atraumatic.  Cardiovascular:     Rate and Rhythm: Normal rate and regular rhythm.     Heart sounds: Normal heart sounds. No murmur. No friction rub. No gallop.   Pulmonary:     Effort: Pulmonary effort is normal. No respiratory distress.     Breath sounds: Normal breath sounds. No wheezing or rales.  Skin:    General: Skin is warm and dry.     Findings: No erythema or rash.  Neurological:     Mental Status: Laura Anthony is alert.     OUTCOME:                The patient's symptoms resolved after dosing with Pepcid 20 mg IV and solumedrol 60 mg IV x 1. Laura Anthony was noted to have recurrent mild  hoarseness towards the end of her Trodelvy infusion. The Trodelvy infusion was continued and completed. Laura Anthony was told that Laura Anthony could take Benadryl PO at home later if needed. Laura Anthony was also told to call the on-call provider if needed.   Sandi Mealy, MHS, PA-C

## 2019-05-08 NOTE — Progress Notes (Signed)
Patient Care Team: [name], MD as PCP - General (Family Medicine) Mauro Kaufmann, RN as Oncology Nurse Navigator Carlynn Spry, Charlott Holler, RN as Oncology Nurse Navigator Serpe, Aletha Halim, NP as Nurse Practitioner (Hospice and Palliative Medicine) Nicholas Lose, MD as Consulting Physician (Hematology and Oncology) Delice Bison Charlestine Massed, NP as Nurse Practitioner (Hematology and Oncology)  DIAGNOSIS:    ICD-10-CM   1. Bone metastases (HCC)  C79.51   2. Malignant neoplasm of lower-outer quadrant of left breast of female, estrogen receptor negative ([place])  C50.512    Z17.1     SUMMARY OF ONCOLOGIC HISTORY: 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, estrogen receptor negative ([place])  10/20/2016 Mammogram   Mammogram and ultrasound of the left breast revealed 1.7 cm mass at 4:00 position, 6:30 position 5 x 4 x 4 mm mass, 6:00 position 5 cm nipple 7 x 6 x 11 mm, left axillary lymph node with thickened cortex, T1c N1 stage II a AJCC 8   10/24/2016 Initial Diagnosis   Left breast biopsy 6:30 position 3 cm from nipple: IDC grade 2, DCIS, ER 0%, PR 0%, Ki-67 15% HER-2 positive ratio 2.1; 4:00 position 3 cm from nipple: IDC grade 2, DCIS, ER 0%, PR 0%, Ki-67 35%, HER-2 positive ratio 2.02; left axillary lymph node biopsy positive   11/04/2016 - 02/17/2017 Neo-Adjuvant Chemotherapy   TCH Perjeta 6 cycles followed by Herceptin + Perjeta maintenance to be completed September 2019   11/30/2016 Genetic Testing   Negative genetic testing on the common hereditary cancer panel.  The Hereditary Gene Panel offered by [facility] includes sequencing and/or deletion duplication testing of the following 47 genes: APC, ATM, AXIN2, BARD1, BMPR1A, BRCA1, BRCA2, BRIP1, CDH1, CDK4, CDKN2A (p14ARF), CDKN2A (p16INK4a), CHEK2, CTNNA1, DICER1, EPCAM (Deletion/duplication testing only), GREM1 (promoter region deletion/duplication testing only), KIT, MEN1, MLH1, MSH2, MSH3, MSH6,  MUTYH, NBN, NF1, NHTL1, PALB2, PDGFRA, PMS2, POLD1, POLE, PTEN, RAD50, RAD51C, RAD51D, SDHB, SDHC, SDHD, SMAD4, SMARCA4. STK11, TP53, TSC1, TSC2, and VHL.  The following genes were evaluated for sequence changes only: SDHA and HOXB13 c.251G>A variant only. The report date is November 30, 2016.    03/27/2017 Surgery   Bilateral mastectomies: Left mastectomy: IDC grade 2 0.9 cm, nodes negative, right mastectomy benign, ER 0%, PR 0%, HER-2 positive ratio 2.6   05/08/2017 - 06/09/2017 Radiation Therapy   Adjuvant radiation therapy   10/23/2017 Miscellaneous   Neratinib discontinued after 4 weeks for severe diarrhea   07/25/2018 Relapse/Recurrence   MRI of right elbow showed bone lesion consistent with malignancy. PET scan showed bilateral pulmonary nodules and several lytic bone lesions compatible with metastatic disease. Brain MRI on 08/02/18 showed no evidence of metastatic disease.   08/02/2018 PET scan   Bilateral hypermetabolic lung nodules, LUL 1.3 cm with SUV 3.88, lingular nodule 1.4 cm SUV 3.7, central lingular nodule 1.2 cm SUV 9.76, right middle lobe nodule 1.5 cm SUV 9.9, lytic bone metastases inferior pubic ramus, sacrum, T12, right 11th rib.   08/08/2018 Procedure   Lung biopsy: metastatic carcinoma, HER-2 negative (0), ER/PR negative.   08/10/2018 -  Radiation Therapy   Palliative radiatio to the right humerus along the medial condyle   08/24/2018 - 09/05/2018 Radiation Therapy   Palliative radiation to the right 11th rib and right elbow   09/26/2018 - 12/04/2018 Chemotherapy   Carboplatin atezolizumab at [facility] with Dr. Janan Halter on [place] clinical trial stopped because of new T5 metastases (toxicities included  myopathy required prednisone, immune mediated thyroiditis), right upper extremity DVT on apixaban   12/14/2018 - 04/10/2019 Chemotherapy   The patient had pegfilgrastim-bmez (ZIEXTENZO) injection 6 mg, 6 mg, Subcutaneous,  Once, 2 of 2 cycles Administration: 6 mg  (03/02/2019), 6 mg (02/16/2019), 6 mg (03/16/2019), 6 mg (03/29/2019) eriBULin mesylate (HALAVEN) 2.7 mg in sodium chloride 0.9 % 100 mL chemo infusion, 1.42 mg/m2 = 2.65 mg, Intravenous,  Once, 4 of 4 cycles Dose modification: 1.1 mg/m2 (original dose 1.4 mg/m2, Cycle 2, Reason: Dose not tolerated, Comment: neutropenic fever), 0.7 mg/m2 (original dose 1.4 mg/m2, Cycle 4, Reason: Provider Judgment), 0.7 mg/m2 (original dose 1.4 mg/m2, Cycle 4, Reason: Provider Judgment) Administration: 2.7 mg (12/14/2018), 2.7 mg (12/21/2018), 2 mg (01/03/2019), 2 mg (01/10/2019), 1.35 mg (02/15/2019), 1.35 mg (03/01/2019), 1.35 mg (03/15/2019), 1.35 mg (03/28/2019)  for chemotherapy treatment.    12/17/2018 -  Radiation Therapy   Palliative radiation to sternal, sacral & pelvic lesions and SRS for T3 and C7-T1 lesions.   04/11/2019 -  Chemotherapy   The patient had palonosetron (ALOXI) injection 0.25 mg, 0.25 mg, Intravenous,  Once, 2 of 4 cycles Administration: 0.25 mg (04/11/2019), 0.25 mg (04/17/2019), 0.25 mg (05/02/2019) pegfilgrastim (NEULASTA) injection 6 mg, 6 mg, Subcutaneous, Once, 2 of 3 cycles Administration: 6 mg (04/18/2019) pegfilgrastim (NEULASTA ONPRO KIT) injection 6 mg, 6 mg, Subcutaneous, Once, 0 of 1 cycle fosaprepitant (EMEND) 150 mg in sodium chloride 0.9 % 145 mL IVPB, 150 mg, Intravenous,  Once, 2 of 4 cycles Administration: 150 mg (04/11/2019), 150 mg (04/17/2019), 150 mg (05/02/2019) sacituzumab govitecan-hziy (TRODELVY) 590 mg in sodium chloride 0.9 % 250 mL (1.9094 mg/mL) chemo infusion, 8 mg/kg = 590 mg (100 % of original dose 8 mg/kg), Intravenous,  Once, 2 of 4 cycles Dose modification: 8 mg/kg (original dose 8 mg/kg, Cycle 1, Reason: Provider Judgment), 4 mg/kg (original dose 4 mg/kg, Cycle 2, Reason: Other (see comments), Comment: split bags), 4 mg/kg (original dose 4 mg/kg, Cycle 2, Reason: Other (see comments), Comment: split bags) Administration: 590 mg (04/11/2019), 590 mg (04/17/2019), 590 mg  (05/02/2019)  for chemotherapy treatment.    Bone metastases ([place])  08/07/2018 Initial Diagnosis   Bone metastases ([place])   12/14/2018 - 04/24/2019 Chemotherapy   The patient had pertuzumab (PERJETA) 420 mg in sodium chloride 0.9 % 250 mL chemo infusion, 420 mg (100 % of original dose 420 mg), Intravenous, Once, 5 of 6 cycles Dose modification: 420 mg (original dose 420 mg, Cycle 1, Reason: Provider Judgment) Administration: 420 mg (12/14/2018), 420 mg (01/10/2019), 420 mg (03/28/2019), 420 mg (01/30/2019), 420 mg (03/01/2019) trastuzumab-dkst (OGIVRI) 600 mg in sodium chloride 0.9 % 250 mL chemo infusion, 609 mg, Intravenous,  Once, 5 of 6 cycles Administration: 600 mg (12/14/2018), 450 mg (01/10/2019), 450 mg (03/28/2019), 450 mg (01/30/2019), 450 mg (03/01/2019)  for chemotherapy treatment.    12/17/2018 -  Radiation Therapy   Palliative radiation to sternal, sacral & pelvic lesions and SRS for T3 and C7-T1 lesions.     CHIEF COMPLIANT: Follow-up of metastatic breast cancer, cycle 2 day 8 Sacituzumab-Govitecan  INTERVAL HISTORY: Laura Anthony is a 44 y.o. with above-mentioned history of metastatic breast cancer who is currently on treatment with Sacituzumab-Govitecan. She presents to the clinic today for day 8 of cycle 2.   ALLERGIES:  is allergic to sumatriptan; statins; and tape.  MEDICATIONS:  Current Outpatient Medications  Medication Sig Dispense Refill  . dexamethasone (DECADRON) 4 MG tablet Take 2 tablets (8 mg) daily for 3  days after chemotherapy. Take with food. 30 tablet 1  . ELIQUIS 5 MG TABS tablet Take 1 tablet (5 mg total) by mouth 2 (two) times daily. 60 tablet 3  . fluconazole (DIFLUCAN) 100 MG tablet Take 1 tablet (100 mg total) by mouth daily. 7 tablet 0  . FLUoxetine (PROZAC) 20 MG capsule Take 1 capsule (20 mg total) by mouth daily. 90 capsule 1  . FLUoxetine (PROZAC) 40 MG capsule Take 1 capsule (40 mg total) by mouth daily. 90 capsule 1  . gabapentin  (NEURONTIN) 300 MG capsule Take 2 capsules (600 mg total) by mouth at bedtime. 60 capsule 5  . Lactulose 20 GM/30ML SOLN Take 30 mLs (20 g total) by mouth daily as needed. 236 mL 1  . levofloxacin (LEVAQUIN) 500 MG tablet Take 1 tablet (500 mg total) by mouth daily. 7 tablet 0  . levothyroxine (SYNTHROID) 88 MCG tablet Take 1 tablet (88 mcg total) by mouth daily before breakfast. 90 tablet 0  . lidocaine-prilocaine (EMLA) cream APPLY 1 APPLICATION TOPICALLY AS NEEDED. 30 g 0  . lidocaine-prilocaine (EMLA) cream Apply to affected area once 30 g 3  . lisdexamfetamine (VYVANSE) 30 MG capsule Take 30 mg by mouth every morning.     . loperamide (IMODIUM A-D) 2 MG tablet Take 2 at diarrhea onset, then 1 every 2hr until 12hrs with no BM. May take 2 every 4hrs at night. If diarrhea recurs repeat. 100 tablet 1  . methadone (DOLOPHINE) 10 MG tablet Take 1 tablet (10 mg total) by mouth every 6 (six) hours as needed. 120 tablet 0  . naloxone (NARCAN) 4 MG/0.1ML LIQD nasal spray kit Spray once in one nostril prn overdose, may repeat x 1 1 each 1  . ondansetron (ZOFRAN) 8 MG tablet Take 1 tablet (8 mg total) by mouth 2 (two) times daily as needed. Start on the third day after chemotherapy. 30 tablet 1  . oxyCODONE-acetaminophen (PERCOCET) 10-325 MG tablet Take 1 tablet by mouth every 4 (four) hours as needed for pain. 120 tablet 0  . pantoprazole (PROTONIX) 40 MG tablet Take 1 tablet (40 mg total) by mouth 2 (two) times daily. 60 tablet 0  . polyethylene glycol (MIRALAX / GLYCOLAX) 17 g packet Take 17 g by mouth daily as needed for mild constipation or moderate constipation. 14 each 0  . prochlorperazine (COMPAZINE) 10 MG tablet Take 1 tablet (10 mg total) by mouth every 6 (six) hours as needed (Nausea or vomiting). 30 tablet 1  . rizatriptan (MAXALT) 10 MG tablet Take 10 mg by mouth daily as needed for migraine (and may repeat once in 2 hours, if no relief).     Marland Kitchen senna-docusate (SENOKOT-S) 8.6-50 MG tablet Take  1 tablet by mouth 2 (two) times daily. (Patient taking differently: Take 1 tablet by mouth 2 (two) times daily as needed for moderate constipation. ) 60 tablet 0  . traZODone (DESYREL) 50 MG tablet Take 1 tablet (50 mg total) by mouth at bedtime as needed for sleep. (Patient taking differently: Take 50 mg by mouth at bedtime. ) 90 tablet 1  . zolpidem (AMBIEN CR) 6.25 MG CR tablet Take 1 tablet (6.25 mg total) by mouth at bedtime as needed for sleep. 30 tablet 2   No current facility-administered medications for this visit.   Facility-Administered Medications Ordered in Other Visits  Medication Dose Route Frequency Provider Last Rate Last Admin  . sodium chloride flush (NS) 0.9 % injection 10 mL  10 mL Intravenous PRN Doniqua Saxby,  Sukhman Martine, MD   10 mL at 03/01/19 1400  . sodium chloride flush (NS) 0.9 % injection 10 mL  10 mL Intravenous PRN Nicholas Lose, MD   10 mL at 05/09/19 0842    PHYSICAL EXAMINATION: ECOG PERFORMANCE STATUS: 1 - Symptomatic but completely ambulatory  There were no vitals filed for this visit. There were no vitals filed for this visit.  LABORATORY DATA:  I have reviewed the data as listed CMP Latest Ref Rng & Units 05/02/2019 04/17/2019 04/11/2019  Glucose 70 - 99 mg/dL 92 99 76  BUN 6 - 20 mg/dL 9 21(H) 15  Creatinine 0.44 - 1.00 mg/dL 0.70 0.74 0.72  Sodium 135 - 145 mmol/L 143 137 139  Potassium 3.5 - 5.1 mmol/L 3.6 3.9 4.0  Chloride 98 - 111 mmol/L 105 100 103  CO2 22 - 32 mmol/L 27 29 28   Calcium 8.9 - 10.3 mg/dL 8.8(L) 8.8(L) 8.8(L)  Total Protein 6.5 - 8.1 g/dL 6.7 6.7 7.3  Total Bilirubin 0.3 - 1.2 mg/dL 0.2(L) 0.3 0.3  Alkaline Phos 38 - 126 U/L 140(H) 129(H) 184(H)  AST 15 - 41 U/L 19 21 27   ALT 0 - 44 U/L 23 43 23    Lab Results  Component Value Date   WBC 4.0 05/02/2019   HGB 9.3 (L) 05/02/2019   HCT 29.6 (L) 05/02/2019   MCV 102.1 (H) 05/02/2019   PLT 112 (L) 05/02/2019   NEUTROABS 2.8 05/02/2019    ASSESSMENT & PLAN:  Malignant neoplasm of  lower-outer quadrant of left breast of female, estrogen receptor negative (HCC) Mammogram and ultrasound of the left breast revealed 1.7 cm mass at 4:00 position, 6:30 position 5 x 4 x 4 mm mass, 6:00 position 5 cm nipple 7 x 6 x 11 mm, left axillary lymph node with thickened cortex, T1c N1 stage II a AJCC 8  10/24/2016: Left breast biopsy 6:30 position 3 cm from nipple: IDC grade 2, DCIS, ER 0%, PR 0%, Ki-67 15% HER-2 positive ratio 2.1; 4:00 position 3 cm from nipple: IDC grade 2, DCIS, ER 0%, PR 0%, Ki-67 35%, HER-2 positive ratio 2.02 Lymph node biopsy positive  Treatment Summary: 1. Neoadjuvant chemotherapy with TCHPcompleted 02/17/2017 this would be followed by Herceptinand Perjetamaintenance for 1 yearcompleted September 2019 2.Bilateral mastectomies 03/28/2016:Bilateral mastectomies: Left mastectomy: IDC grade 2 0.9 cm, nodes negative, right mastectomy benign, ER 0%, PR 0%, HER-2 positive ratio 2.6 3.Adjuvant radiation4/08/2017 to 06/09/2017 4.Neratinib started 10/12/2017 discontinued due to diarrhea 5. Elbow fracture: Due to metastatic disease, palliative radiation therapy 6. Carboplatin atezolizumab at [facility] on a clinical trial [facility] 043 stopped for progression 12/04/2018 ---------------------------------------------------------------------------------------------------------------- Lung nodule biopsy: Metastatic breast cancer triple negative  CT CAP 12/03/2018: Progression of lung nodules and bone metastases done at [facility] CTA 01/2019: improvement in lung nodules Current treatment: Sacituzumab-Govitecan days 1 and 8 every 3 weeks with Neulasta on day 9 started 04/11/2019, today is cycle   Toxicities: 1.  Hoarseness of voice: Infusion reaction: We will proceed to give the treatment over 4 hours. 2.  Fatigue 3.  Thrombocytopenia: Platelet count 80 we will reduce the dosage of Sacituzumab-Govitecan today and will proceed with her treatment. 4.  Anemia:  Stable monitoring closely.  Pain issues: Patient starting to get the left chest wall pain once again.  She called Dr. Maryjean Ka office to schedule an appointment.  Currently she takes methadone twice a day and then Percocets as needed.  Our plan is to perform scans after completion of 3  cycles of treatment.   No orders of the defined types were placed in this encounter.  The patient has a good understanding of the overall plan. she agrees with it. she will call with any problems that may develop before the next visit here.  Total time spent: 30 mins including face to face time and time spent for planning, charting and coordination of care  Nicholas Lose, MD 05/09/2019  I, Cloyde Reams Dorshimer, am acting as scribe for Dr. Nicholas Lose.  I have reviewed the above documentation for accuracy and completeness, and I agree with the above.

## 2019-05-09 ENCOUNTER — Other Ambulatory Visit: Payer: 59

## 2019-05-09 ENCOUNTER — Other Ambulatory Visit: Payer: Self-pay | Admitting: *Deleted

## 2019-05-09 ENCOUNTER — Inpatient Hospital Stay: Payer: 59

## 2019-05-09 ENCOUNTER — Inpatient Hospital Stay: Payer: 59 | Admitting: Hematology and Oncology

## 2019-05-09 ENCOUNTER — Inpatient Hospital Stay (HOSPITAL_BASED_OUTPATIENT_CLINIC_OR_DEPARTMENT_OTHER): Payer: 59 | Admitting: Hematology and Oncology

## 2019-05-09 ENCOUNTER — Other Ambulatory Visit: Payer: Self-pay

## 2019-05-09 ENCOUNTER — Ambulatory Visit: Payer: 59

## 2019-05-09 ENCOUNTER — Encounter: Payer: Self-pay | Admitting: *Deleted

## 2019-05-09 VITALS — BP 105/71 | HR 74 | Temp 98.5°F | Resp 18

## 2019-05-09 VITALS — BP 119/88 | HR 105 | Temp 97.8°F | Resp 18 | Ht 67.0 in | Wt 164.1 lb

## 2019-05-09 DIAGNOSIS — C7951 Secondary malignant neoplasm of bone: Secondary | ICD-10-CM

## 2019-05-09 DIAGNOSIS — C50512 Malignant neoplasm of lower-outer quadrant of left female breast: Secondary | ICD-10-CM

## 2019-05-09 DIAGNOSIS — Z171 Estrogen receptor negative status [ER-]: Secondary | ICD-10-CM

## 2019-05-09 DIAGNOSIS — Z7189 Other specified counseling: Secondary | ICD-10-CM

## 2019-05-09 DIAGNOSIS — Z95828 Presence of other vascular implants and grafts: Secondary | ICD-10-CM

## 2019-05-09 LAB — CBC WITH DIFFERENTIAL (CANCER CENTER ONLY)
Abs Immature Granulocytes: 0.03 10*3/uL (ref 0.00–0.07)
Basophils Absolute: 0 10*3/uL (ref 0.0–0.1)
Basophils Relative: 1 %
Eosinophils Absolute: 0 10*3/uL (ref 0.0–0.5)
Eosinophils Relative: 2 %
HCT: 32.8 % — ABNORMAL LOW (ref 36.0–46.0)
Hemoglobin: 10.4 g/dL — ABNORMAL LOW (ref 12.0–15.0)
Immature Granulocytes: 1 %
Lymphocytes Relative: 30 %
Lymphs Abs: 0.8 10*3/uL (ref 0.7–4.0)
MCH: 31.8 pg (ref 26.0–34.0)
MCHC: 31.7 g/dL (ref 30.0–36.0)
MCV: 100.3 fL — ABNORMAL HIGH (ref 80.0–100.0)
Monocytes Absolute: 0.5 10*3/uL (ref 0.1–1.0)
Monocytes Relative: 18 %
Neutro Abs: 1.3 10*3/uL — ABNORMAL LOW (ref 1.7–7.7)
Neutrophils Relative %: 48 %
Platelet Count: 80 10*3/uL — ABNORMAL LOW (ref 150–400)
RBC: 3.27 MIL/uL — ABNORMAL LOW (ref 3.87–5.11)
RDW: 16.5 % — ABNORMAL HIGH (ref 11.5–15.5)
WBC Count: 2.6 10*3/uL — ABNORMAL LOW (ref 4.0–10.5)
nRBC: 0 % (ref 0.0–0.2)

## 2019-05-09 LAB — CMP (CANCER CENTER ONLY)
ALT: 26 U/L (ref 0–44)
AST: 17 U/L (ref 15–41)
Albumin: 3 g/dL — ABNORMAL LOW (ref 3.5–5.0)
Alkaline Phosphatase: 115 U/L (ref 38–126)
Anion gap: 9 (ref 5–15)
BUN: 13 mg/dL (ref 6–20)
CO2: 26 mmol/L (ref 22–32)
Calcium: 8.3 mg/dL — ABNORMAL LOW (ref 8.9–10.3)
Chloride: 104 mmol/L (ref 98–111)
Creatinine: 0.74 mg/dL (ref 0.44–1.00)
GFR, Est AFR Am: >60 mL/min (ref >60)
GFR, Estimated: >60 mL/min (ref >60)
Glucose, Bld: 94 mg/dL (ref 70–99)
Potassium: 3.3 mmol/L — ABNORMAL LOW (ref 3.5–5.1)
Sodium: 139 mmol/L (ref 135–145)
Total Bilirubin: 0.2 mg/dL — ABNORMAL LOW (ref 0.3–1.2)
Total Protein: 6.7 g/dL (ref 6.5–8.1)

## 2019-05-09 LAB — MAGNESIUM: Magnesium: 2.2 mg/dL (ref 1.7–2.4)

## 2019-05-09 LAB — PHOSPHORUS: Phosphorus: 1.7 mg/dL — ABNORMAL LOW (ref 2.5–4.6)

## 2019-05-09 MED ORDER — FAMOTIDINE IN NACL 20-0.9 MG/50ML-% IV SOLN
INTRAVENOUS | Status: AC
  Filled 2019-05-09: qty 50

## 2019-05-09 MED ORDER — FAMOTIDINE IN NACL 20-0.9 MG/50ML-% IV SOLN
20.0000 mg | Freq: Once | INTRAVENOUS | Status: AC
  Administered 2019-05-09: 20 mg via INTRAVENOUS

## 2019-05-09 MED ORDER — SODIUM CHLORIDE 0.9% FLUSH
10.0000 mL | INTRAVENOUS | Status: DC | PRN
  Administered 2019-05-09: 10 mL via INTRAVENOUS
  Filled 2019-05-09: qty 10

## 2019-05-09 MED ORDER — PALONOSETRON HCL INJECTION 0.25 MG/5ML
0.2500 mg | Freq: Once | INTRAVENOUS | Status: AC
  Administered 2019-05-09: 0.25 mg via INTRAVENOUS

## 2019-05-09 MED ORDER — ATROPINE SULFATE 1 MG/ML IJ SOLN
0.5000 mg | Freq: Once | INTRAMUSCULAR | Status: AC | PRN
  Administered 2019-05-09: 0.5 mg via INTRAVENOUS

## 2019-05-09 MED ORDER — DIPHENHYDRAMINE HCL 50 MG/ML IJ SOLN
INTRAMUSCULAR | Status: AC
  Filled 2019-05-09: qty 1

## 2019-05-09 MED ORDER — ACETAMINOPHEN 325 MG PO TABS
ORAL_TABLET | ORAL | Status: AC
  Filled 2019-05-09: qty 2

## 2019-05-09 MED ORDER — SODIUM CHLORIDE 0.9 % IV SOLN
10.0000 mg | Freq: Once | INTRAVENOUS | Status: AC
  Administered 2019-05-09: 10 mg via INTRAVENOUS
  Filled 2019-05-09: qty 10

## 2019-05-09 MED ORDER — ACETAMINOPHEN 325 MG PO TABS
650.0000 mg | ORAL_TABLET | Freq: Once | ORAL | Status: AC
  Administered 2019-05-09: 650 mg via ORAL

## 2019-05-09 MED ORDER — SODIUM CHLORIDE 0.9 % IV SOLN
Freq: Once | INTRAVENOUS | Status: AC
  Filled 2019-05-09: qty 250

## 2019-05-09 MED ORDER — PALONOSETRON HCL INJECTION 0.25 MG/5ML
INTRAVENOUS | Status: AC
  Filled 2019-05-09: qty 5

## 2019-05-09 MED ORDER — SODIUM CHLORIDE 0.9% FLUSH
10.0000 mL | INTRAVENOUS | Status: DC | PRN
  Administered 2019-05-09: 10 mL
  Filled 2019-05-09: qty 10

## 2019-05-09 MED ORDER — ATROPINE SULFATE 1 MG/ML IJ SOLN
INTRAMUSCULAR | Status: AC
  Filled 2019-05-09: qty 1

## 2019-05-09 MED ORDER — DIPHENHYDRAMINE HCL 50 MG/ML IJ SOLN
25.0000 mg | Freq: Once | INTRAMUSCULAR | Status: AC
  Administered 2019-05-09: 25 mg via INTRAVENOUS

## 2019-05-09 MED ORDER — PEGFILGRASTIM 6 MG/0.6ML ~~LOC~~ PSKT
6.0000 mg | PREFILLED_SYRINGE | Freq: Once | SUBCUTANEOUS | Status: AC
  Administered 2019-05-09: 6 mg via SUBCUTANEOUS

## 2019-05-09 MED ORDER — SODIUM CHLORIDE 0.9 % IV SOLN
150.0000 mg | Freq: Once | INTRAVENOUS | Status: AC
  Administered 2019-05-09: 150 mg via INTRAVENOUS
  Filled 2019-05-09: qty 150

## 2019-05-09 MED ORDER — HEPARIN SOD (PORK) LOCK FLUSH 100 UNIT/ML IV SOLN
500.0000 [IU] | Freq: Once | INTRAVENOUS | Status: AC | PRN
  Administered 2019-05-09: 500 [IU]
  Filled 2019-05-09: qty 5

## 2019-05-09 MED ORDER — SODIUM CHLORIDE 0.9 % IV SOLN
3.0000 mg/kg | Freq: Once | INTRAVENOUS | Status: AC
  Administered 2019-05-09: 220 mg via INTRAVENOUS
  Filled 2019-05-09: qty 22

## 2019-05-09 MED ORDER — PEGFILGRASTIM 6 MG/0.6ML ~~LOC~~ PSKT
PREFILLED_SYRINGE | SUBCUTANEOUS | Status: AC
  Filled 2019-05-09: qty 0.6

## 2019-05-09 MED ORDER — VENLAFAXINE HCL ER 75 MG PO CP24: 75.0000 mg | ORAL_CAPSULE | Freq: Every day | ORAL | 6 refills | Status: DC

## 2019-05-09 NOTE — Progress Notes (Signed)
Per Dr. Lindi Adie, ok to treat with platelets 80 and ANC 1.3.

## 2019-05-09 NOTE — Patient Instructions (Addendum)
Remove Neulasta onpro after 8:30pm on Friday 05/10/19.   [place] Discharge Instructions for Patients Receiving Chemotherapy  Today you received the following chemotherapy agent: Sacituzumab (TRODELVY)  To help prevent nausea and vomiting after your treatment, we encourage you to take your nausea medication as directed by your MD.   If you develop nausea and vomiting that is not controlled by your nausea medication, call the clinic.   BELOW ARE SYMPTOMS THAT SHOULD BE REPORTED IMMEDIATELY:  *FEVER GREATER THAN 100.5 F  *CHILLS WITH OR WITHOUT FEVER  NAUSEA AND VOMITING THAT IS NOT CONTROLLED WITH YOUR NAUSEA MEDICATION  *UNUSUAL SHORTNESS OF BREATH  *UNUSUAL BRUISING OR BLEEDING  TENDERNESS IN MOUTH AND THROAT WITH OR WITHOUT PRESENCE OF ULCERS  *URINARY PROBLEMS  *BOWEL PROBLEMS  UNUSUAL RASH Items with * indicate a potential emergency and should be followed up as soon as possible.  Feel free to call the clinic should you have any questions or concerns. The clinic phone number is [phone].  Please show the [place] [facility] at check-in to the Emergency Department and triage nurse.

## 2019-05-09 NOTE — Assessment & Plan Note (Signed)
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 Neoadjuvant chemotherapy with TCHPcompleted 02/17/2017 this would be followed by Herceptinand Perjetamaintenance for 1 yearcompleted September 2019 2.Bilateral mastectomies 03/28/2016:Bilateral mastectomies: Left mastectomy: IDC grade 2 0.9 cm, nodes negative, right mastectomy benign, ER 0%, PR 0%, HER-2 positive ratio 2.6 3.Adjuvant radiation4/08/2017 to 06/09/2017 4.Neratinib started 10/12/2017 discontinued due to diarrhea 5. Elbow fracture: Due to metastatic disease, palliative radiation therapy 6. Carboplatin atezolizumab at [facility] on a clinical trial [facility] 043 stopped for progression 12/04/2018 ---------------------------------------------------------------------------------------------------------------- Lung nodule biopsy: Metastatic breast cancer triple negative  CT CAP 12/03/2018: Progression of lung nodules and bone metastases done at [facility] CTA 01/2019: improvement in lung nodules Current treatment: Sacituzumab-Govitecan days 1 and 8 every 3 weeks with Neulasta on day 9 started 04/11/2019, today is cycle 3  Toxicities: 1.  Hoarseness of voice: Infusion reaction: We will proceed to give the treatment over 4 hours. 2.  Fatigue  Return to clinic tomorrow for Udenyca injection Follow-up in 3 weeks for cycle 4. Scans to be done following fourth cycle

## 2019-05-10 ENCOUNTER — Inpatient Hospital Stay: Payer: 59

## 2019-05-17 NOTE — Progress Notes (Signed)
Pharmacist Chemotherapy Monitoring - Follow Up Assessment    I verify that I have reviewed each item in the below checklist:  . Regimen for the patient is scheduled for the appropriate day and plan matches scheduled date. Marland Kitchen Appropriate non-routine labs are ordered dependent on drug ordered. . If applicable, additional medications reviewed and ordered per protocol based on lifetime cumulative doses and/or treatment regimen.   Plan for follow-up and/or issues identified: No . I-vent associated with next due treatment: No . MD and/or nursing notified: No  Laura Anthony 05/17/2019 12:31 PM

## 2019-05-20 ENCOUNTER — Telehealth: Payer: Self-pay

## 2019-05-20 NOTE — Telephone Encounter (Signed)
RN spoke with patient to follow up with on-call message from 4/18 regarding pain.   Pt reports she did not report to ED over weekend, was able to manage pain.  Reports pain in back/ribs, making it difficult to take deep breath.  Pt denies having actual shortness of breath, just aware of pain when a deep breathe is taken.  Pt had nerve ablation performed this AM.    RN reviewed with MD.  MD recommendations to increase Gabapentin to:   300mg  AM, afternoon, and continue with 600 mg QHS.  Pt aware, voiced agreement and understanding.  Per pt no new Rx needed at this time.  Pt aware to call clinic with any changes or concerns.

## 2019-05-21 ENCOUNTER — Other Ambulatory Visit: Payer: Self-pay | Admitting: Radiation Therapy

## 2019-05-23 ENCOUNTER — Inpatient Hospital Stay: Payer: 59

## 2019-05-23 ENCOUNTER — Other Ambulatory Visit: Payer: Self-pay

## 2019-05-23 ENCOUNTER — Encounter: Payer: Self-pay | Admitting: *Deleted

## 2019-05-23 VITALS — BP 116/75 | HR 66 | Temp 98.2°F | Resp 18

## 2019-05-23 DIAGNOSIS — C7951 Secondary malignant neoplasm of bone: Secondary | ICD-10-CM

## 2019-05-23 DIAGNOSIS — Z7189 Other specified counseling: Secondary | ICD-10-CM

## 2019-05-23 DIAGNOSIS — Z171 Estrogen receptor negative status [ER-]: Secondary | ICD-10-CM

## 2019-05-23 DIAGNOSIS — C50512 Malignant neoplasm of lower-outer quadrant of left female breast: Secondary | ICD-10-CM

## 2019-05-23 DIAGNOSIS — Z95828 Presence of other vascular implants and grafts: Secondary | ICD-10-CM

## 2019-05-23 LAB — CBC WITH DIFFERENTIAL (CANCER CENTER ONLY)
Abs Immature Granulocytes: 0.02 10*3/uL (ref 0.00–0.07)
Basophils Absolute: 0 10*3/uL (ref 0.0–0.1)
Basophils Relative: 1 %
Eosinophils Absolute: 0 10*3/uL (ref 0.0–0.5)
Eosinophils Relative: 0 %
HCT: 31.2 % — ABNORMAL LOW (ref 36.0–46.0)
Hemoglobin: 9.8 g/dL — ABNORMAL LOW (ref 12.0–15.0)
Immature Granulocytes: 0 %
Lymphocytes Relative: 14 %
Lymphs Abs: 0.7 10*3/uL (ref 0.7–4.0)
MCH: 32.6 pg (ref 26.0–34.0)
MCHC: 31.4 g/dL (ref 30.0–36.0)
MCV: 103.7 fL — ABNORMAL HIGH (ref 80.0–100.0)
Monocytes Absolute: 0.4 10*3/uL (ref 0.1–1.0)
Monocytes Relative: 8 %
Neutro Abs: 3.9 10*3/uL (ref 1.7–7.7)
Neutrophils Relative %: 77 %
Platelet Count: 137 10*3/uL — ABNORMAL LOW (ref 150–400)
RBC: 3.01 MIL/uL — ABNORMAL LOW (ref 3.87–5.11)
RDW: 17.2 % — ABNORMAL HIGH (ref 11.5–15.5)
WBC Count: 5.1 10*3/uL (ref 4.0–10.5)
nRBC: 0 % (ref 0.0–0.2)

## 2019-05-23 LAB — CMP (CANCER CENTER ONLY)
ALT: 53 U/L — ABNORMAL HIGH (ref 0–44)
AST: 37 U/L (ref 15–41)
Albumin: 2.8 g/dL — ABNORMAL LOW (ref 3.5–5.0)
Alkaline Phosphatase: 190 U/L — ABNORMAL HIGH (ref 38–126)
Anion gap: 9 (ref 5–15)
BUN: 14 mg/dL (ref 6–20)
CO2: 24 mmol/L (ref 22–32)
Calcium: 9.1 mg/dL (ref 8.9–10.3)
Chloride: 107 mmol/L (ref 98–111)
Creatinine: 0.71 mg/dL (ref 0.44–1.00)
GFR, Est AFR Am: >60 mL/min (ref >60)
GFR, Estimated: >60 mL/min (ref >60)
Glucose, Bld: 109 mg/dL — ABNORMAL HIGH (ref 70–99)
Potassium: 3.9 mmol/L (ref 3.5–5.1)
Sodium: 140 mmol/L (ref 135–145)
Total Bilirubin: 0.2 mg/dL — ABNORMAL LOW (ref 0.3–1.2)
Total Protein: 7.1 g/dL (ref 6.5–8.1)

## 2019-05-23 LAB — PHOSPHORUS: Phosphorus: 2.9 mg/dL (ref 2.5–4.6)

## 2019-05-23 LAB — MAGNESIUM: Magnesium: 1.9 mg/dL (ref 1.7–2.4)

## 2019-05-23 MED ORDER — FAMOTIDINE IN NACL 20-0.9 MG/50ML-% IV SOLN
20.0000 mg | Freq: Once | INTRAVENOUS | Status: AC
  Administered 2019-05-23: 20 mg via INTRAVENOUS

## 2019-05-23 MED ORDER — SODIUM CHLORIDE 0.9% FLUSH
10.0000 mL | INTRAVENOUS | Status: DC | PRN
  Administered 2019-05-23: 10 mL
  Filled 2019-05-23: qty 10

## 2019-05-23 MED ORDER — SODIUM CHLORIDE 0.9 % IV SOLN
150.0000 mg | Freq: Once | INTRAVENOUS | Status: AC
  Administered 2019-05-23: 150 mg via INTRAVENOUS
  Filled 2019-05-23: qty 150

## 2019-05-23 MED ORDER — PALONOSETRON HCL INJECTION 0.25 MG/5ML
0.2500 mg | Freq: Once | INTRAVENOUS | Status: AC
  Administered 2019-05-23: 0.25 mg via INTRAVENOUS

## 2019-05-23 MED ORDER — DIPHENHYDRAMINE HCL 50 MG/ML IJ SOLN
25.0000 mg | Freq: Once | INTRAMUSCULAR | Status: AC
  Administered 2019-05-23: 25 mg via INTRAVENOUS

## 2019-05-23 MED ORDER — SODIUM CHLORIDE 0.9 % IV SOLN
3.0000 mg/kg | Freq: Once | INTRAVENOUS | Status: AC
  Administered 2019-05-23: 220 mg via INTRAVENOUS
  Filled 2019-05-23: qty 22

## 2019-05-23 MED ORDER — SODIUM CHLORIDE 0.9 % IV SOLN
Freq: Once | INTRAVENOUS | Status: AC
  Filled 2019-05-23: qty 250

## 2019-05-23 MED ORDER — SODIUM CHLORIDE 0.9 % IV SOLN
10.0000 mg | Freq: Once | INTRAVENOUS | Status: AC
  Administered 2019-05-23: 10 mg via INTRAVENOUS
  Filled 2019-05-23: qty 10

## 2019-05-23 MED ORDER — FAMOTIDINE IN NACL 20-0.9 MG/50ML-% IV SOLN
INTRAVENOUS | Status: AC
  Filled 2019-05-23: qty 50

## 2019-05-23 MED ORDER — ACETAMINOPHEN 325 MG PO TABS
650.0000 mg | ORAL_TABLET | Freq: Once | ORAL | Status: AC
  Administered 2019-05-23: 650 mg via ORAL

## 2019-05-23 MED ORDER — PALONOSETRON HCL INJECTION 0.25 MG/5ML
INTRAVENOUS | Status: AC
  Filled 2019-05-23: qty 5

## 2019-05-23 MED ORDER — SODIUM CHLORIDE 0.9% FLUSH
10.0000 mL | INTRAVENOUS | Status: DC | PRN
  Administered 2019-05-23: 10 mL via INTRAVENOUS
  Filled 2019-05-23: qty 10

## 2019-05-23 MED ORDER — ACETAMINOPHEN 325 MG PO TABS
ORAL_TABLET | ORAL | Status: AC
  Filled 2019-05-23: qty 2

## 2019-05-23 MED ORDER — ATROPINE SULFATE 1 MG/ML IJ SOLN: 0.5000 mg | Freq: Once | INTRAMUSCULAR | Status: DC | PRN

## 2019-05-23 MED ORDER — HEPARIN SOD (PORK) LOCK FLUSH 100 UNIT/ML IV SOLN
500.0000 [IU] | Freq: Once | INTRAVENOUS | Status: AC | PRN
  Administered 2019-05-23: 500 [IU]
  Filled 2019-05-23: qty 5

## 2019-05-23 MED ORDER — DIPHENHYDRAMINE HCL 50 MG/ML IJ SOLN
INTRAMUSCULAR | Status: AC
  Filled 2019-05-23: qty 1

## 2019-05-23 NOTE — Patient Instructions (Signed)

## 2019-05-23 NOTE — Patient Instructions (Signed)
Sacituzumab Govitecan Injection What is this medicine? SACITUZUMAB GOVITECAN (SAK i TOOZ ue mab GOE vi TEE kan) is a monoclonal antibody combined with chemotherapy. It is used to treat breast cancer. This medicine may be used for other purposes; ask your health care provider or pharmacist if you have questions. COMMON BRAND NAME(S): TRODELVY What should I tell my health care provider before I take this medicine? They need to know if you have any of these conditions:  diarrhea  infection (especially a virus infection such as chickenpox, cold sores, or herpes)  liver disease  low blood counts, like low white cell, platelet, or red cell counts  an unusual or allergic reaction to sacituzumab govitecan, other medicines, foods, dyes, or preservatives  pregnant or trying to get pregnant  breast-feeding How should I use this medicine? This medicine is for infusion into a vein. It is given by a healthcare professional in a hospital or clinic setting. Talk to your pediatrician about the use of this medicine in children. Special care may be needed. Overdosage: If you think you have taken too much of this medicine contact a poison control center or emergency room at once. NOTE: This medicine is only for you. Do not share this medicine with others. What if I miss a dose? Keep appointments for follow-up doses. It is important not to miss your dose. Call your doctor or health care professional if you are unable to keep an appointment. What may interact with this medicine?  certain antivirals for HIV or hepatitis  gemfibrozil This list may not describe all possible interactions. Give your health care provider a list of all the medicines, herbs, non-prescription drugs, or dietary supplements you use. Also tell them if you smoke, drink alcohol, or use illegal drugs. Some items may interact with your medicine. What should I watch for while using this medicine? Your condition will be monitored  carefully while you are receiving this medicine. You may need blood work done while you are taking this medicine. This medicine can cause serious allergic reactions. To reduce your risk, you may need to take medicine before treatment with this medicine. Take your medicine as directed. Do not become pregnant while taking this medicine or for 6 months after stopping it. Women should inform their health care professional if they wish to become pregnant or think they might be pregnant. Men should not father a child while taking this medicine and for 3 months after stopping it. There is potential for serious side effects to an unborn child. Talk to your health care professional for more information. Do not breast-feed a child while taking this medicine or for 1 month after stopping it. This medicine may increase your risk of getting an infection. Call your health care professional for advice if you get a fever, chills, or sore throat, or other symptoms of a cold or flu. Do not treat yourself. Try to avoid being around people who are sick. Avoid taking medicines that contain aspirin, acetaminophen, ibuprofen, naproxen, or ketoprofen unless instructed by your health care professional. These medicines may hide a fever. This medicine has caused ovarian failure in some women. This medicine may make it more difficult to get pregnant. Talk to your health care professional if you are concerned about your fertility. What side effects may I notice from receiving this medicine? Side effects that you should report to your doctor or health care professional as soon as possible:  allergic reactions like skin rash, itching or hives; swelling of the face,   lips, or tongue  diarrhea  nausea/vomiting  signs and symptoms of infection like fever; chills; cough; sore throat; pain or trouble passing urine  signs and symptoms of low red blood cells or anemia such as unusually weak or tired; feeling faint or lightheaded;  falls; breathing problems Side effects that usually do not require medical attention (report these to your doctor or health care professional if they continue or are bothersome):  constipation  hair loss  headache  loss of appetite  signs and symptoms of high blood sugar such as being more thirsty or hungry or having to urinate more than normal. You may also feel very tired or have blurry vision.  trouble sleeping This list may not describe all possible side effects. Call your doctor for medical advice about side effects. You may report side effects to FDA at 1-800-FDA-1088. Where should I keep my medicine? This medicine is given in a hospital or clinic and will not be stored at home. NOTE: This sheet is a summary. It may not cover all possible information. If you have questions about this medicine, talk to your doctor, pharmacist, or health care provider.  2020 Elsevier/Gold Standard (2018-05-28 11:31:44)  

## 2019-05-28 ENCOUNTER — Encounter (HOSPITAL_COMMUNITY)
Admission: RE | Admit: 2019-05-28 | Discharge: 2019-05-28 | Disposition: A | Discharge status: Home / Self Care | Payer: 59 | Source: Ambulatory Visit | Attending: Hematology and Oncology | Admitting: Hematology and Oncology

## 2019-05-28 ENCOUNTER — Ambulatory Visit (HOSPITAL_COMMUNITY): Admission: RE | Admit: 2019-05-28 | Payer: 59 | Source: Ambulatory Visit

## 2019-05-28 ENCOUNTER — Encounter (HOSPITAL_COMMUNITY): Payer: Self-pay

## 2019-05-28 ENCOUNTER — Ambulatory Visit (HOSPITAL_COMMUNITY)
Admission: RE | Admit: 2019-05-28 | Discharge: 2019-05-28 | Disposition: A | Discharge status: Home / Self Care | Payer: 59 | Source: Ambulatory Visit | Attending: Hematology and Oncology | Admitting: Hematology and Oncology

## 2019-05-28 ENCOUNTER — Other Ambulatory Visit: Payer: Self-pay | Admitting: Hematology and Oncology

## 2019-05-28 ENCOUNTER — Other Ambulatory Visit: Payer: Self-pay

## 2019-05-28 DIAGNOSIS — C7951 Secondary malignant neoplasm of bone: Secondary | ICD-10-CM

## 2019-05-28 MED ORDER — SODIUM CHLORIDE (PF) 0.9 % IJ SOLN
INTRAMUSCULAR | Status: AC
  Filled 2019-05-28: qty 50

## 2019-05-28 MED ORDER — ZOLPIDEM TARTRATE ER 12.5 MG PO TBCR
12.5000 mg | EXTENDED_RELEASE_TABLET | Freq: Every evening | ORAL | 3 refills | Status: DC | PRN
Start: 2019-05-28 — End: 2019-10-01

## 2019-05-28 MED ORDER — IOHEXOL 300 MG/ML  SOLN
100.0000 mL | Freq: Once | INTRAMUSCULAR | Status: AC | PRN
  Administered 2019-05-28: 100 mL via INTRAVENOUS

## 2019-05-28 MED ORDER — HEPARIN SOD (PORK) LOCK FLUSH 100 UNIT/ML IV SOLN
INTRAVENOUS | Status: AC
  Filled 2019-05-28: qty 5

## 2019-05-28 MED ORDER — HEPARIN SOD (PORK) LOCK FLUSH 100 UNIT/ML IV SOLN
500.0000 [IU] | Freq: Once | INTRAVENOUS | Status: AC
  Administered 2019-05-28: 500 [IU] via INTRAVENOUS

## 2019-05-28 MED ORDER — TECHNETIUM TC 99M MEDRONATE IV KIT: 20.0000 | PACK | Freq: Once | INTRAVENOUS | Status: DC | PRN

## 2019-05-28 NOTE — Progress Notes (Signed)
Patient Care Team: [name], MD as PCP - General (Family Medicine) Mauro Kaufmann, RN as Oncology Nurse Navigator Carlynn Spry, Charlott Holler, RN as Oncology Nurse Navigator [place], Aletha Halim, NP as Nurse Practitioner (Hospice and Palliative Medicine) Nicholas Lose, MD as Consulting Physician (Hematology and Oncology) Delice Bison Charlestine Massed, NP as Nurse Practitioner (Hematology and Oncology)  DIAGNOSIS:    ICD-10-CM   1. Bone metastases (HCC)  C79.51   2. Metastatic breast cancer ([place])  C50.919   3. Malignant neoplasm of lower-outer quadrant of left breast of female, estrogen receptor negative ([place])  C50.512    Z17.1     SUMMARY OF ONCOLOGIC HISTORY: 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, estrogen receptor negative ([place])  10/20/2016 Mammogram   Mammogram and ultrasound of the left breast revealed 1.7 cm mass at 4:00 position, 6:30 position 5 x 4 x 4 mm mass, 6:00 position 5 cm nipple 7 x 6 x 11 mm, left axillary lymph node with thickened cortex, T1c N1 stage II a AJCC 8   10/24/2016 Initial Diagnosis   Left breast biopsy 6:30 position 3 cm from nipple: IDC grade 2, DCIS, ER 0%, PR 0%, Ki-67 15% HER-2 positive ratio 2.1; 4:00 position 3 cm from nipple: IDC grade 2, DCIS, ER 0%, PR 0%, Ki-67 35%, HER-2 positive ratio 2.02; left axillary lymph node biopsy positive   11/04/2016 - 02/17/2017 Neo-Adjuvant Chemotherapy   TCH Perjeta 6 cycles followed by Herceptin + Perjeta maintenance to be completed September 2019   11/30/2016 Genetic Testing   Negative genetic testing on the common hereditary cancer panel.  The Hereditary Gene Panel offered by [facility] includes sequencing and/or deletion duplication testing of the following 47 genes: APC, ATM, AXIN2, BARD1, BMPR1A, BRCA1, BRCA2, BRIP1, CDH1, CDK4, CDKN2A (p14ARF), CDKN2A (p16INK4a), CHEK2, CTNNA1, DICER1, EPCAM (Deletion/duplication testing only), GREM1 (promoter region deletion/duplication testing  only), KIT, MEN1, MLH1, MSH2, MSH3, MSH6, MUTYH, NBN, NF1, NHTL1, PALB2, PDGFRA, PMS2, POLD1, POLE, PTEN, RAD50, RAD51C, RAD51D, SDHB, SDHC, SDHD, SMAD4, SMARCA4. STK11, TP53, TSC1, TSC2, and VHL.  The following genes were evaluated for sequence changes only: SDHA and HOXB13 c.251G>A variant only. The report date is November 30, 2016.    03/27/2017 Surgery   Bilateral mastectomies: Left mastectomy: IDC grade 2 0.9 cm, nodes negative, right mastectomy benign, ER 0%, PR 0%, HER-2 positive ratio 2.6   05/08/2017 - 06/09/2017 Radiation Therapy   Adjuvant radiation therapy   10/23/2017 Miscellaneous   Neratinib discontinued after 4 weeks for severe diarrhea   07/25/2018 Relapse/Recurrence   MRI of right elbow showed bone lesion consistent with malignancy. PET scan showed bilateral pulmonary nodules and several lytic bone lesions compatible with metastatic disease. Brain MRI on 08/02/18 showed no evidence of metastatic disease.   08/02/2018 PET scan   Bilateral hypermetabolic lung nodules, LUL 1.3 cm with SUV 3.88, lingular nodule 1.4 cm SUV 3.7, central lingular nodule 1.2 cm SUV 9.76, right middle lobe nodule 1.5 cm SUV 9.9, lytic bone metastases inferior pubic ramus, sacrum, T12, right 11th rib.   08/08/2018 Procedure   Lung biopsy: metastatic carcinoma, HER-2 negative (0), ER/PR negative.   08/10/2018 -  Radiation Therapy   Palliative radiatio to the right humerus along the medial condyle   08/24/2018 - 09/05/2018 Radiation Therapy   Palliative radiation to the right 11th rib and right elbow   09/26/2018 - 12/04/2018 Chemotherapy   Carboplatin atezolizumab at [facility] with Dr. Janan Halter on [place] clinical  trial stopped because of new T5 metastases (toxicities included myopathy required prednisone, immune mediated thyroiditis), right upper extremity DVT on apixaban   12/14/2018 - 04/10/2019 Chemotherapy   The patient had pegfilgrastim-bmez (ZIEXTENZO) injection 6 mg, 6 mg, Subcutaneous,  Once, 2  of 2 cycles Administration: 6 mg (03/02/2019), 6 mg (02/16/2019), 6 mg (03/16/2019), 6 mg (03/29/2019) eriBULin mesylate (HALAVEN) 2.7 mg in sodium chloride 0.9 % 100 mL chemo infusion, 1.42 mg/m2 = 2.65 mg, Intravenous,  Once, 4 of 4 cycles Dose modification: 1.1 mg/m2 (original dose 1.4 mg/m2, Cycle 2, Reason: Dose not tolerated, Comment: neutropenic fever), 0.7 mg/m2 (original dose 1.4 mg/m2, Cycle 4, Reason: Provider Judgment), 0.7 mg/m2 (original dose 1.4 mg/m2, Cycle 4, Reason: Provider Judgment) Administration: 2.7 mg (12/14/2018), 2.7 mg (12/21/2018), 2 mg (01/03/2019), 2 mg (01/10/2019), 1.35 mg (02/15/2019), 1.35 mg (03/01/2019), 1.35 mg (03/15/2019), 1.35 mg (03/28/2019)  for chemotherapy treatment.    12/17/2018 -  Radiation Therapy   Palliative radiation to sternal, sacral & pelvic lesions and SRS for T3 and C7-T1 lesions.   04/11/2019 -  Chemotherapy   The patient had palonosetron (ALOXI) injection 0.25 mg, 0.25 mg, Intravenous,  Once, 3 of 4 cycles Administration: 0.25 mg (04/11/2019), 0.25 mg (04/17/2019), 0.25 mg (05/02/2019), 0.25 mg (05/09/2019), 0.25 mg (05/23/2019) pegfilgrastim (NEULASTA) injection 6 mg, 6 mg, Subcutaneous, Once, 1 of 1 cycle Administration: 6 mg (04/18/2019) pegfilgrastim (NEULASTA ONPRO KIT) injection 6 mg, 6 mg, Subcutaneous, Once, 2 of 3 cycles Administration: 6 mg (05/09/2019) fosaprepitant (EMEND) 150 mg in sodium chloride 0.9 % 145 mL IVPB, 150 mg, Intravenous,  Once, 3 of 4 cycles Administration: 150 mg (04/11/2019), 150 mg (04/17/2019), 150 mg (05/02/2019), 150 mg (05/09/2019), 150 mg (05/23/2019) sacituzumab govitecan-hziy (TRODELVY) 590 mg in sodium chloride 0.9 % 250 mL (1.9094 mg/mL) chemo infusion, 8 mg/kg = 590 mg (100 % of original dose 8 mg/kg), Intravenous,  Once, 3 of 4 cycles Dose modification: 8 mg/kg (original dose 8 mg/kg, Cycle 1, Reason: Provider Judgment), 4 mg/kg (original dose 4 mg/kg, Cycle 2, Reason: Other (see comments), Comment: split bags), 3 mg/kg  (original dose 4 mg/kg, Cycle 2, Reason: Provider Judgment), 4 mg/kg (original dose 4 mg/kg, Cycle 2, Reason: Other (see comments), Comment: split bags) Administration: 590 mg (04/11/2019), 590 mg (04/17/2019), 590 mg (05/02/2019), 220 mg (05/09/2019), 220 mg (05/23/2019), 220 mg (05/09/2019), 220 mg (05/23/2019)  for chemotherapy treatment.    Bone metastases ([place])  08/07/2018 Initial Diagnosis   Bone metastases ([place])   12/14/2018 - 04/24/2019 Chemotherapy   The patient had pertuzumab (PERJETA) 420 mg in sodium chloride 0.9 % 250 mL chemo infusion, 420 mg (100 % of original dose 420 mg), Intravenous, Once, 5 of 6 cycles Dose modification: 420 mg (original dose 420 mg, Cycle 1, Reason: Provider Judgment) Administration: 420 mg (12/14/2018), 420 mg (01/10/2019), 420 mg (03/28/2019), 420 mg (01/30/2019), 420 mg (03/01/2019) trastuzumab-dkst (OGIVRI) 600 mg in sodium chloride 0.9 % 250 mL chemo infusion, 609 mg, Intravenous,  Once, 5 of 6 cycles Administration: 600 mg (12/14/2018), 450 mg (01/10/2019), 450 mg (03/28/2019), 450 mg (01/30/2019), 450 mg (03/01/2019)  for chemotherapy treatment.    12/17/2018 -  Radiation Therapy   Palliative radiation to sternal, sacral & pelvic lesions and SRS for T3 and C7-T1 lesions.     CHIEF COMPLIANT: Follow-up of metastatic breast cancer,cycle 3 day 8 Sacituzumab-Govitecan  INTERVAL HISTORY: Laura Anthony is a 44 y.o. with above-mentioned history of metastatic breast cancer who is currently on treatment with Sacituzumab-Govitecan.CT CAP on  05/28/19 showed reduction in the size of dominant pulmonary nodules and stable osseous metastases. Bone scan on 05/28/19 showed some evidence of involvement of the bones but it could also be degenerative changes.  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  The bilateral humeri uptake is related to arthritis and the rib uptake is from the rib fracture.  She presents to the clinic todayfor day 8 of cycle 3 and to  review her scans.  She informed me that she has developed osteonecrosis of the jaw and is seeing a dentist who has been monitoring it.  She has been undergoing ablation treatments and they have been helping her tremendously.  She tripped on her dog and fell on the face and hurt her left cheekbone.  ALLERGIES:  is allergic to sumatriptan; statins; and tape.  MEDICATIONS:  Current Outpatient Medications  Medication Sig Dispense Refill  . dexamethasone (DECADRON) 4 MG tablet Take 2 tablets (8 mg) daily for 3 days after chemotherapy. Take with food. 30 tablet 1  . ELIQUIS 5 MG TABS tablet Take 1 tablet (5 mg total) by mouth 2 (two) times daily. 60 tablet 3  . gabapentin (NEURONTIN) 300 MG capsule Take 2 capsules (600 mg total) by mouth at bedtime. (Patient taking differently: Take 600 mg by mouth at bedtime. 355m AM, afternoon, and 600 mg QHS.) 60 capsule 5  . Lactulose 20 GM/30ML SOLN Take 30 mLs (20 g total) by mouth daily as needed. 236 mL 1  . levothyroxine (SYNTHROID) 88 MCG tablet Take 1 tablet (88 mcg total) by mouth daily before breakfast. 90 tablet 0  . lidocaine-prilocaine (EMLA) cream APPLY 1 APPLICATION TOPICALLY AS NEEDED. 30 g 0  . loperamide (IMODIUM A-D) 2 MG tablet Take 2 at diarrhea onset, then 1 every 2hr until 12hrs with no BM. May take 2 every 4hrs at night. If diarrhea recurs repeat. 100 tablet 1  . methadone (DOLOPHINE) 10 MG tablet Take 1 tablet (10 mg total) by mouth every 6 (six) hours as needed. 120 tablet 0  . naloxone (NARCAN) 4 MG/0.1ML LIQD nasal spray kit Spray once in one nostril prn overdose, may repeat x 1 1 each 1  . ondansetron (ZOFRAN) 8 MG tablet Take 1 tablet (8 mg total) by mouth 2 (two) times daily as needed. Start on the third day after chemotherapy. 30 tablet 1  . oxyCODONE-acetaminophen (PERCOCET) 10-325 MG tablet Take 1 tablet by mouth every 4 (four) hours as needed for pain. 120 tablet 0  . pantoprazole (PROTONIX) 40 MG tablet Take 1 tablet (40 mg total)  by mouth 2 (two) times daily. 60 tablet 0  . polyethylene glycol (MIRALAX / GLYCOLAX) 17 g packet Take 17 g by mouth daily as needed for mild constipation or moderate constipation. 14 each 0  . prochlorperazine (COMPAZINE) 10 MG tablet Take 1 tablet (10 mg total) by mouth every 6 (six) hours as needed (Nausea or vomiting). 30 tablet 1  . rizatriptan (MAXALT) 10 MG tablet Take 10 mg by mouth daily as needed for migraine (and may repeat once in 2 hours, if no relief).     .Marland Kitchensenna-docusate (SENOKOT-S) 8.6-50 MG tablet Take 1 tablet by mouth 2 (two) times daily. (Patient taking differently: Take 1 tablet by mouth 2 (two) times daily as needed for moderate constipation. ) 60 tablet 0  . venlafaxine XR (EFFEXOR-XR) 75 MG 24 hr capsule Take 1 capsule (75 mg total) by mouth daily with breakfast. 30 capsule 6  . zolpidem (  AMBIEN CR) 12.5 MG CR tablet Take 1 tablet (12.5 mg total) by mouth at bedtime as needed for sleep. 30 tablet 3   No current facility-administered medications for this visit.   Facility-Administered Medications Ordered in Other Visits  Medication Dose Route Frequency Provider Last Rate Last Admin  . sodium chloride flush (NS) 0.9 % injection 10 mL  10 mL Intravenous PRN Nicholas Lose, MD   10 mL at 03/01/19 1400  . technetium medronate (TC-MDP) injection 20 millicurie  20 millicurie Intravenous Once PRN Gus Height, MD        PHYSICAL EXAMINATION: ECOG PERFORMANCE STATUS: 1 - Symptomatic but completely ambulatory  Vitals:   05/29/19 0923  BP: (!) 127/97  Pulse: 99  Resp: 18  Temp: 99.1 F (37.3 C)  SpO2: 98%   Filed Weights   05/29/19 0923  Weight: 163 lb 11.2 oz (74.3 kg)    LABORATORY DATA:  I have reviewed the data as listed CMP Latest Ref Rng & Units 05/23/2019 05/09/2019 05/02/2019  Glucose 70 - 99 mg/dL 109(H) 94 92  BUN 6 - 20 mg/dL _0 Creatinine 0.44 - 1.00 mg/dL 0.71 0.74 0.70  Sodium 135 - 145 mmol/L 140 139 143  Potassium 3.5 - 5.1 mmol/L 3.9 3.3(L) 3.6    Chloride 98 - 111 mmol/L 107 104 105  CO2 22 - 32 mmol/L _1 Calcium 8.9 - 10.3 mg/dL 9.1 8.3(L) 8.8(L)  Total Protein 6.5 - 8.1 g/dL 7.1 6.7 6.7  Total Bilirubin 0.3 - 1.2 mg/dL 0.2(L) 0.2(L) 0.2(L)  Alkaline Phos 38 - 126 U/L 190(H) 115 140(H)  AST 15 - 41 U/L 37 17 19  ALT 0 - 44 U/L 53(H) 26 23    Lab Results  Component Value Date   WBC 4.2 05/29/2019   HGB 10.6 (L) 05/29/2019   HCT 33.9 (L) 05/29/2019   MCV 102.7 (H) 05/29/2019   PLT 153 05/29/2019   NEUTROABS 2.5 05/29/2019    ASSESSMENT & PLAN:  Malignant neoplasm of lower-outer quadrant of left breast of female, estrogen receptor negative (HCC) Mammogram and ultrasound of the left breast revealed 1.7 cm mass at 4:00 position, 6:30 position 5 x 4 x 4 mm mass, 6:00 position 5 cm nipple 7 x 6 x 11 mm, left axillary lymph node with thickened cortex, T1c N1 stage II a AJCC 8  10/24/2016: Left breast biopsy 6:30 position 3 cm from nipple: IDC grade 2, DCIS, ER 0%, PR 0%, Ki-67 15% HER-2 positive ratio 2.1; 4:00 position 3 cm from nipple: IDC grade 2, DCIS, ER 0%, PR 0%, Ki-67 35%, HER-2 positive ratio 2.02 Lymph node biopsy positive  Treatment Summary: 1. Neoadjuvant chemotherapy with TCHPcompleted 02/17/2017 this would be followed by Herceptinand Perjetamaintenance for 1 yearcompleted September 2019 2.Bilateral mastectomies 03/28/2016:Bilateral mastectomies: Left mastectomy: IDC grade 2 0.9 cm, nodes negative, right mastectomy benign, ER 0%, PR 0%, HER-2 positive ratio 2.6 3.Adjuvant radiation4/08/2017 to 06/09/2017 4.Neratinib started 10/12/2017 discontinued due to diarrhea 5. Elbow fracture: Due to metastatic disease, palliative radiation therapy 6. Carboplatin atezolizumab at [facility] on a clinical trial [facility] 043 stopped for progression 12/04/2018 ---------------------------------------------------------------------------------------------------------------- Lung nodule biopsy: Metastatic breast cancer  triple negative  CT CAP 12/03/2018: Progression of lung nodules and bone metastases done at [facility] CTA 01/2019: improvement in lung nodules Current treatment: Sacituzumab-Govitecan days 1 and 8 every 3 weeks with Neulasta on day 9 started 04/11/2019, today is cycle   Toxicities: 1.  Hoarseness of voice: Infusion  reaction: We will proceed to give the treatment over 4 hours.  She has done much better since we made the change 2.  Fatigue 3.  Thrombocytopenia: Platelet count is up to 153 4.  Anemia: Stable monitoring closely.  Today's hemoglobin is 10.6  CT CAP 05/28/2019: Reduction in the size of the dominant pulmonary nodules.  (1.6 cm to 1.3 cm) another nodule 1.9 cm to 1.6 cm.  Stable lytic lesions right sternum, T12 vertebral body, left iliac bone, sacrum and right inferior pubic ramus.  Stable healing fracture right posterolateral eighth rib Given the good response to chemotherapy, we will continue on with the same treatment plan.  Pain management: On methadone and Percocets.  Has received local therapy with nerve blocks and ablation with Dr. Maryjean Ka Osteonecrosis of the jaw: Will discontinue Xgeva. Puffiness of the face: Due to steroids.  We will cut down the dosage of dexamethasone after today's treatment and if she does well we will discontinue dexamethasone with the next treatment. We will continue the same treatment and plan for 3 more cycles before the next scan.  No orders of the defined types were placed in this encounter.  The patient has a good understanding of the overall plan. she agrees with it. she will call with any problems that may develop before the next visit here.  Total time spent: 30 mins including face to face time and time spent for planning, charting and coordination of care  Nicholas Lose, MD 05/29/2019  I, Cloyde Reams Dorshimer, am acting as scribe for Dr. Nicholas Lose.  I have reviewed the above documentation for accuracy and completeness, and I agree  with the above.

## 2019-05-29 ENCOUNTER — Inpatient Hospital Stay: Payer: 59

## 2019-05-29 ENCOUNTER — Other Ambulatory Visit: Payer: Self-pay

## 2019-05-29 ENCOUNTER — Inpatient Hospital Stay (HOSPITAL_BASED_OUTPATIENT_CLINIC_OR_DEPARTMENT_OTHER): Payer: 59 | Admitting: Hematology and Oncology

## 2019-05-29 VITALS — BP 127/97 | HR 99 | Temp 99.1°F | Resp 18 | Ht 67.0 in | Wt 163.7 lb

## 2019-05-29 DIAGNOSIS — Z7189 Other specified counseling: Secondary | ICD-10-CM

## 2019-05-29 DIAGNOSIS — C7951 Secondary malignant neoplasm of bone: Secondary | ICD-10-CM

## 2019-05-29 DIAGNOSIS — C50512 Malignant neoplasm of lower-outer quadrant of left female breast: Secondary | ICD-10-CM

## 2019-05-29 DIAGNOSIS — Z171 Estrogen receptor negative status [ER-]: Secondary | ICD-10-CM

## 2019-05-29 DIAGNOSIS — C50919 Malignant neoplasm of unspecified site of unspecified female breast: Secondary | ICD-10-CM | POA: Diagnosis not present

## 2019-05-29 DIAGNOSIS — G893 Neoplasm related pain (acute) (chronic): Secondary | ICD-10-CM

## 2019-05-29 DIAGNOSIS — Z95828 Presence of other vascular implants and grafts: Secondary | ICD-10-CM

## 2019-05-29 LAB — PHOSPHORUS: Phosphorus: 3.1 mg/dL (ref 2.5–4.6)

## 2019-05-29 LAB — CMP (CANCER CENTER ONLY)
ALT: 52 U/L — ABNORMAL HIGH (ref 0–44)
AST: 23 U/L (ref 15–41)
Albumin: 3.1 g/dL — ABNORMAL LOW (ref 3.5–5.0)
Alkaline Phosphatase: 133 U/L — ABNORMAL HIGH (ref 38–126)
Anion gap: 7 (ref 5–15)
BUN: 15 mg/dL (ref 6–20)
CO2: 28 mmol/L (ref 22–32)
Calcium: 8.9 mg/dL (ref 8.9–10.3)
Chloride: 102 mmol/L (ref 98–111)
Creatinine: 0.77 mg/dL (ref 0.44–1.00)
GFR, Est AFR Am: >60 mL/min (ref >60)
GFR, Estimated: >60 mL/min (ref >60)
Glucose, Bld: 91 mg/dL (ref 70–99)
Potassium: 4.1 mmol/L (ref 3.5–5.1)
Sodium: 137 mmol/L (ref 135–145)
Total Bilirubin: 0.2 mg/dL — ABNORMAL LOW (ref 0.3–1.2)
Total Protein: 7 g/dL (ref 6.5–8.1)

## 2019-05-29 LAB — CBC WITH DIFFERENTIAL (CANCER CENTER ONLY)
Abs Immature Granulocytes: 0.18 10*3/uL — ABNORMAL HIGH (ref 0.00–0.07)
Basophils Absolute: 0 10*3/uL (ref 0.0–0.1)
Basophils Relative: 1 %
Eosinophils Absolute: 0 10*3/uL (ref 0.0–0.5)
Eosinophils Relative: 1 %
HCT: 33.9 % — ABNORMAL LOW (ref 36.0–46.0)
Hemoglobin: 10.6 g/dL — ABNORMAL LOW (ref 12.0–15.0)
Immature Granulocytes: 4 %
Lymphocytes Relative: 21 %
Lymphs Abs: 0.9 10*3/uL (ref 0.7–4.0)
MCH: 32.1 pg (ref 26.0–34.0)
MCHC: 31.3 g/dL (ref 30.0–36.0)
MCV: 102.7 fL — ABNORMAL HIGH (ref 80.0–100.0)
Monocytes Absolute: 0.6 10*3/uL (ref 0.1–1.0)
Monocytes Relative: 14 %
Neutro Abs: 2.5 10*3/uL (ref 1.7–7.7)
Neutrophils Relative %: 59 %
Platelet Count: 153 10*3/uL (ref 150–400)
RBC: 3.3 MIL/uL — ABNORMAL LOW (ref 3.87–5.11)
RDW: 17 % — ABNORMAL HIGH (ref 11.5–15.5)
WBC Count: 4.2 10*3/uL (ref 4.0–10.5)
nRBC: 0 % (ref 0.0–0.2)

## 2019-05-29 LAB — MAGNESIUM: Magnesium: 2 mg/dL (ref 1.7–2.4)

## 2019-05-29 MED ORDER — SODIUM CHLORIDE 0.9% FLUSH
10.0000 mL | INTRAVENOUS | Status: DC | PRN
  Administered 2019-05-29: 10 mL via INTRAVENOUS
  Filled 2019-05-29: qty 10

## 2019-05-29 MED ORDER — FAMOTIDINE IN NACL 20-0.9 MG/50ML-% IV SOLN
INTRAVENOUS | Status: AC
  Filled 2019-05-29: qty 50

## 2019-05-29 MED ORDER — PEGFILGRASTIM 6 MG/0.6ML ~~LOC~~ PSKT
6.0000 mg | PREFILLED_SYRINGE | Freq: Once | SUBCUTANEOUS | Status: AC
  Administered 2019-05-29: 6 mg via SUBCUTANEOUS

## 2019-05-29 MED ORDER — DIPHENHYDRAMINE HCL 50 MG/ML IJ SOLN
25.0000 mg | Freq: Once | INTRAMUSCULAR | Status: AC
  Administered 2019-05-29: 25 mg via INTRAVENOUS

## 2019-05-29 MED ORDER — ACETAMINOPHEN 325 MG PO TABS
ORAL_TABLET | ORAL | Status: AC
  Filled 2019-05-29: qty 2

## 2019-05-29 MED ORDER — PALONOSETRON HCL INJECTION 0.25 MG/5ML
INTRAVENOUS | Status: AC
  Filled 2019-05-29: qty 5

## 2019-05-29 MED ORDER — SODIUM CHLORIDE 0.9 % IV SOLN
10.0000 mg | Freq: Once | INTRAVENOUS | Status: AC
  Administered 2019-05-29: 10 mg via INTRAVENOUS
  Filled 2019-05-29: qty 10

## 2019-05-29 MED ORDER — SODIUM CHLORIDE 0.9 % IV SOLN
3.0000 mg/kg | Freq: Once | INTRAVENOUS | Status: AC
  Administered 2019-05-29: 220 mg via INTRAVENOUS
  Filled 2019-05-29: qty 22

## 2019-05-29 MED ORDER — HEPARIN SOD (PORK) LOCK FLUSH 100 UNIT/ML IV SOLN
500.0000 [IU] | Freq: Once | INTRAVENOUS | Status: AC | PRN
  Administered 2019-05-29: 500 [IU]
  Filled 2019-05-29: qty 5

## 2019-05-29 MED ORDER — ATROPINE SULFATE 1 MG/ML IJ SOLN: 0.5000 mg | Freq: Once | INTRAMUSCULAR | Status: DC | PRN

## 2019-05-29 MED ORDER — DIPHENHYDRAMINE HCL 50 MG/ML IJ SOLN
INTRAMUSCULAR | Status: AC
  Filled 2019-05-29: qty 1

## 2019-05-29 MED ORDER — SODIUM CHLORIDE 0.9 % IV SOLN
150.0000 mg | Freq: Once | INTRAVENOUS | Status: AC
  Administered 2019-05-29: 150 mg via INTRAVENOUS
  Filled 2019-05-29: qty 150

## 2019-05-29 MED ORDER — FAMOTIDINE IN NACL 20-0.9 MG/50ML-% IV SOLN
20.0000 mg | Freq: Once | INTRAVENOUS | Status: AC
  Administered 2019-05-29: 20 mg via INTRAVENOUS

## 2019-05-29 MED ORDER — PALONOSETRON HCL INJECTION 0.25 MG/5ML
0.2500 mg | Freq: Once | INTRAVENOUS | Status: AC
  Administered 2019-05-29: 0.25 mg via INTRAVENOUS

## 2019-05-29 MED ORDER — SODIUM CHLORIDE 0.9% FLUSH
10.0000 mL | INTRAVENOUS | Status: DC | PRN
  Administered 2019-05-29: 10 mL
  Filled 2019-05-29: qty 10

## 2019-05-29 MED ORDER — SODIUM CHLORIDE 0.9 % IV SOLN
Freq: Once | INTRAVENOUS | Status: AC
  Filled 2019-05-29: qty 250

## 2019-05-29 MED ORDER — PEGFILGRASTIM 6 MG/0.6ML ~~LOC~~ PSKT: 6.0000 mg | PREFILLED_SYRINGE | Freq: Once | SUBCUTANEOUS | Status: DC

## 2019-05-29 MED ORDER — PEGFILGRASTIM 6 MG/0.6ML ~~LOC~~ PSKT
PREFILLED_SYRINGE | SUBCUTANEOUS | Status: AC
  Filled 2019-05-29: qty 0.6

## 2019-05-29 MED ORDER — ACETAMINOPHEN 325 MG PO TABS
650.0000 mg | ORAL_TABLET | Freq: Once | ORAL | Status: AC
  Administered 2019-05-29: 650 mg via ORAL

## 2019-05-29 NOTE — Patient Instructions (Signed)

## 2019-05-29 NOTE — Patient Instructions (Signed)
[place] Discharge Instructions for Patients Receiving Chemotherapy  Today you received the following chemotherapy agents: Sacituzumab govitecan-hziy [name]) and Pegfilgrastim (Neulasta Onpro)    To help prevent nausea and vomiting after your treatment, we encourage you to take your nausea medication as prescribed.    If you develop nausea and vomiting that is not controlled by your nausea medication, call the clinic.   BELOW ARE SYMPTOMS THAT SHOULD BE REPORTED IMMEDIATELY:  *FEVER GREATER THAN 100.5 F  *CHILLS WITH OR WITHOUT FEVER  NAUSEA AND VOMITING THAT IS NOT CONTROLLED WITH YOUR NAUSEA MEDICATION  *UNUSUAL SHORTNESS OF BREATH  *UNUSUAL BRUISING OR BLEEDING  TENDERNESS IN MOUTH AND THROAT WITH OR WITHOUT PRESENCE OF ULCERS  *URINARY PROBLEMS  *BOWEL PROBLEMS  UNUSUAL RASH Items with * indicate a potential emergency and should be followed up as soon as possible.  Feel free to call the clinic should you have any questions or concerns. The clinic phone number is [phone].  Please show the [place] at check-in to the Emergency Department and triage nurse.

## 2019-05-29 NOTE — Assessment & Plan Note (Signed)
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 Neoadjuvant chemotherapy with TCHPcompleted 02/17/2017 this would be followed by Herceptinand Perjetamaintenance for 1 yearcompleted September 2019 2.Bilateral mastectomies 03/28/2016:Bilateral mastectomies: Left mastectomy: IDC grade 2 0.9 cm, nodes negative, right mastectomy benign, ER 0%, PR 0%, HER-2 positive ratio 2.6 3.Adjuvant radiation4/08/2017 to 06/09/2017 4.Neratinib started 10/12/2017 discontinued due to diarrhea 5. Elbow fracture: Due to metastatic disease, palliative radiation therapy 6. Carboplatin atezolizumab at [facility] on a clinical trial [facility] 043 stopped for progression 12/04/2018 ---------------------------------------------------------------------------------------------------------------- Lung nodule biopsy: Metastatic breast cancer triple negative  CT CAP 12/03/2018: Progression of lung nodules and bone metastases done at [facility] CTA 01/2019: improvement in lung nodules Current treatment: Sacituzumab-Govitecan days 1 and 8 every 3 weeks with Neulasta on day 9 started 04/11/2019, today is cycle   Toxicities: 1.  Hoarseness of voice: Infusion reaction: We will proceed to give the treatment over 4 hours. 2.  Fatigue 3.  Thrombocytopenia: Platelet count 80 we will reduce the dosage of Sacituzumab-Govitecan today and will proceed with her treatment. 4.  Anemia: Stable monitoring closely.  CT CAP 05/28/2019: Reduction in the size of the  dominant pulmonary nodules.  (1.6 cm to 1.3 cm) another nodule 1.9 cm to 1.6 cm.  Stable lytic lesions right sternum, T12 vertebral body, left iliac bone, sacrum and right inferior pubic ramus.  Stable healing fracture right posterolateral eighth rib Given the good response to chemotherapy, we will continue on with the same treatment plan.  Pain management: On methadone and Percocets.  Has received local therapy with nerve blocks with Dr. Maryjean Ka

## 2019-05-30 ENCOUNTER — Other Ambulatory Visit (HOSPITAL_COMMUNITY): Payer: 59

## 2019-05-30 ENCOUNTER — Ambulatory Visit: Payer: 59 | Admitting: Hematology and Oncology

## 2019-05-30 ENCOUNTER — Other Ambulatory Visit: Payer: 59

## 2019-05-30 ENCOUNTER — Ambulatory Visit: Payer: 59

## 2019-05-31 ENCOUNTER — Other Ambulatory Visit: Payer: Self-pay | Admitting: Hematology and Oncology

## 2019-05-31 ENCOUNTER — Telehealth: Payer: Self-pay | Admitting: Hematology and Oncology

## 2019-05-31 ENCOUNTER — Ambulatory Visit: Payer: 59

## 2019-05-31 NOTE — Telephone Encounter (Signed)
Scheduled per los, patient has been called and notified. 

## 2019-06-03 ENCOUNTER — Inpatient Hospital Stay: Payer: 59 | Attending: Hematology and Oncology

## 2019-06-03 DIAGNOSIS — R5383 Other fatigue: Secondary | ICD-10-CM | POA: Insufficient documentation

## 2019-06-03 DIAGNOSIS — Z923 Personal history of irradiation: Secondary | ICD-10-CM | POA: Insufficient documentation

## 2019-06-03 DIAGNOSIS — C7951 Secondary malignant neoplasm of bone: Secondary | ICD-10-CM | POA: Insufficient documentation

## 2019-06-03 DIAGNOSIS — D696 Thrombocytopenia, unspecified: Secondary | ICD-10-CM | POA: Insufficient documentation

## 2019-06-03 DIAGNOSIS — Z5112 Encounter for antineoplastic immunotherapy: Secondary | ICD-10-CM | POA: Insufficient documentation

## 2019-06-03 DIAGNOSIS — Z9013 Acquired absence of bilateral breasts and nipples: Secondary | ICD-10-CM | POA: Insufficient documentation

## 2019-06-03 DIAGNOSIS — Z171 Estrogen receptor negative status [ER-]: Secondary | ICD-10-CM | POA: Insufficient documentation

## 2019-06-03 DIAGNOSIS — Z79899 Other long term (current) drug therapy: Secondary | ICD-10-CM | POA: Insufficient documentation

## 2019-06-03 DIAGNOSIS — C50512 Malignant neoplasm of lower-outer quadrant of left female breast: Secondary | ICD-10-CM | POA: Insufficient documentation

## 2019-06-03 DIAGNOSIS — R490 Dysphonia: Secondary | ICD-10-CM | POA: Insufficient documentation

## 2019-06-03 DIAGNOSIS — Z9221 Personal history of antineoplastic chemotherapy: Secondary | ICD-10-CM | POA: Insufficient documentation

## 2019-06-04 ENCOUNTER — Encounter: Payer: Self-pay | Admitting: *Deleted

## 2019-06-06 ENCOUNTER — Other Ambulatory Visit: Payer: Self-pay | Admitting: Hematology and Oncology

## 2019-06-07 ENCOUNTER — Telehealth: Payer: Self-pay | Admitting: Emergency Medicine

## 2019-06-07 ENCOUNTER — Ambulatory Visit (HOSPITAL_COMMUNITY)
Admission: RE | Admit: 2019-06-07 | Discharge: 2019-06-07 | Disposition: A | Discharge status: Home / Self Care | Payer: 59 | Source: Ambulatory Visit | Attending: Medical | Admitting: Medical

## 2019-06-07 ENCOUNTER — Inpatient Hospital Stay (HOSPITAL_BASED_OUTPATIENT_CLINIC_OR_DEPARTMENT_OTHER): Payer: 59 | Admitting: Medical

## 2019-06-07 ENCOUNTER — Other Ambulatory Visit: Payer: Self-pay

## 2019-06-07 VITALS — BP 114/82 | HR 115 | Temp 98.2°F | Resp 18 | Ht 67.0 in | Wt 166.4 lb

## 2019-06-07 DIAGNOSIS — R490 Dysphonia: Secondary | ICD-10-CM | POA: Diagnosis not present

## 2019-06-07 DIAGNOSIS — Z5112 Encounter for antineoplastic immunotherapy: Secondary | ICD-10-CM | POA: Diagnosis not present

## 2019-06-07 DIAGNOSIS — Z923 Personal history of irradiation: Secondary | ICD-10-CM | POA: Diagnosis not present

## 2019-06-07 DIAGNOSIS — H6982 Other specified disorders of Eustachian tube, left ear: Secondary | ICD-10-CM | POA: Diagnosis not present

## 2019-06-07 DIAGNOSIS — C50919 Malignant neoplasm of unspecified site of unspecified female breast: Secondary | ICD-10-CM | POA: Diagnosis not present

## 2019-06-07 DIAGNOSIS — R05 Cough: Secondary | ICD-10-CM

## 2019-06-07 DIAGNOSIS — Z9013 Acquired absence of bilateral breasts and nipples: Secondary | ICD-10-CM | POA: Diagnosis not present

## 2019-06-07 DIAGNOSIS — R5383 Other fatigue: Secondary | ICD-10-CM | POA: Diagnosis not present

## 2019-06-07 DIAGNOSIS — Z171 Estrogen receptor negative status [ER-]: Secondary | ICD-10-CM | POA: Diagnosis not present

## 2019-06-07 DIAGNOSIS — R059 Cough, unspecified: Secondary | ICD-10-CM

## 2019-06-07 DIAGNOSIS — C7951 Secondary malignant neoplasm of bone: Secondary | ICD-10-CM | POA: Diagnosis not present

## 2019-06-07 DIAGNOSIS — Z9221 Personal history of antineoplastic chemotherapy: Secondary | ICD-10-CM | POA: Diagnosis not present

## 2019-06-07 DIAGNOSIS — J0100 Acute maxillary sinusitis, unspecified: Secondary | ICD-10-CM | POA: Diagnosis not present

## 2019-06-07 DIAGNOSIS — D696 Thrombocytopenia, unspecified: Secondary | ICD-10-CM | POA: Diagnosis not present

## 2019-06-07 DIAGNOSIS — C50512 Malignant neoplasm of lower-outer quadrant of left female breast: Secondary | ICD-10-CM | POA: Diagnosis present

## 2019-06-07 DIAGNOSIS — Z79899 Other long term (current) drug therapy: Secondary | ICD-10-CM | POA: Diagnosis not present

## 2019-06-07 MED ORDER — SULFAMETHOXAZOLE-TRIMETHOPRIM 800-160 MG PO TABS: 1.0000 | ORAL_TABLET | Freq: Two times a day (BID) | ORAL | 0 refills | Status: DC

## 2019-06-07 NOTE — Patient Instructions (Signed)
Afrin 2 sprays in each nostril twice daily for 3 days  Flonase 2 sprays in each nostril twice daily

## 2019-06-07 NOTE — Telephone Encounter (Signed)
VM from pt reporting ear pain x 2 weeks that has increased in severity and now moved into her jaw bilat.  Denies fever/chills or CP/SOB.  Called pt back, pt agreed to come in now for [facility] appt.  High priority scheduling message sent.  No labs at this time per PA [name].

## 2019-06-07 NOTE — Progress Notes (Signed)
These results were called to [facility] and were reviewed with her .

## 2019-06-07 NOTE — Progress Notes (Signed)
[facility] Progress Note   Laura Anthony [id number] December 26, 1975 44 y.o.  Laura Anthony is managed by Dr. Nicholas Lose  Actively treated with chemotherapy/immunotherapy/hormonal therapy: yes  Current therapy: sacituzumab govitecan-hziy (TRODELVY) IV with Neulasta support   Last treated: 05/29/2019 (cycle #3, day #8)  Next scheduled appointment with provider: 06/13/2019  Assessment: Plan:    Cough - Plan: DG Chest 2 View  Acute non-recurrent maxillary sinusitis - Plan: sulfamethoxazole-trimethoprim (BACTRIM DS) 800-160 MG tablet  Metastatic breast cancer ([place])  Dysfunction of left eustachian tube   Cough: Ms. Viggiano was referred for a chest x-ray which returned showing:  FINDINGS: There are scattered pulmonary nodular opacities bilaterally, similar to most recent study.  There is ill-defined areas of apparent scarring atelectasis, similar to recent CT examination.  No new opacity evident.  The heart size and pulmonary vascularity are normal.  No adenopathy is appreciable by radiography.  Port-A-Cath tip is in the superior vena cava.  Bony metastases seen on CT are not well visualized by radiography. Healing fracture right eighth rib noted.  IMPRESSION: Pulmonary nodular lesions, grossly stable compared to recent CT. Scarring and atelectasis in left lower lobe.  No frank consolidation. Stable cardiac silhouette.  No appreciable adenopathy.  Port-A-Cath tip in superior vena cava.   Maxillary sinusitis: The patient was given a prescription for Bactrim DS 1 p.o. twice daily x7 days.   Metastatic breast cancer: The patient continues to be managed by Dr. Nicholas Lose and is status post cycle 3, day 8 of Trodelvy with Neulasta support.   Eustachian tube dysfunction: The patient was told to begin Afrin nasal spray 2 sprays in each nostril twice daily for no more than 3 days.  Each spray will immediately be followed by 2 sprays of  Flonase twice daily.  She can continue Flonase as long as needed.  We discussed the potential side effects of scant nosebleeds with the use of Flonase.  Please see After Visit Summary for patient specific instructions.  Future Appointments  Date Time Provider [place]  06/13/2019 10:45 AM [facility] None  06/13/2019 11:00 AM [facility] [place] FLUSH [facility] None  06/13/2019 11:45 AM Nicholas Lose, MD [facility] None  06/13/2019 12:30 PM [facility] INFUSION [facility] None  06/20/2019 10:45 AM [facility]FL[facility] None  06/20/2019 11:00 AM [facility] [place] FLUSH [facility] None  06/20/2019 11:45 AM Nicholas Lose, MD [facility] None  06/20/2019 12:30 PM [facility] INFUSION [facility] None    Orders Placed This Encounter  Procedures  . DG Chest 2 View       Subjective:   Patient ID:  Laura Anthony is a 44 y.o. (DOB 01-Aug-1975) female.  Chief Complaint:  Chief Complaint  Patient presents with  . Ear Pain    Bilat    HPI Laura Anthony is a 44 y.o. female with a diagnosis of a metastatic breast cancer. She continues to be managed by Dr. Nicholas Lose and is status post cycle 3, day 8 of Trodelvy with Neulasta support which was dosed on 05/29/2019.  She reports that over the last 3 weeks she has noted popping in her left ear and tenderness in the lymph nodes in the left neck and left shoulder.  She reports ongoing sweats but denies fevers or chills.  On Tuesday she reports having a headache behind her left eye with pressure in that area.  She has had a mostly nonproductive cough since yesterday.  She does report having minimal phlegm production. She has  a scratchy and mildly sore throat. She reports having some recent diarrhea.  She fell at home recently when she tripped while walking up on their patio with her left cheek being abraded by the corner of a window.  She did not seek medical attention.  Medications: I have reviewed the  patient's current medications.  Allergies:  Allergies  Allergen Reactions  . Sumatriptan Other (See Comments)    Numbness to face   . Statins Other (See Comments)    Leg pain  . Tape Rash and Other (See Comments)    Rash from dressing over port-a-cath     Past Medical History:  Diagnosis Date  . ADD (attention deficit disorder)   . Anemia   . Anxiety   . Breast cancer, left breast ([place])    S/P mastectomy 03/27/2017  . DVT (deep venous thrombosis) ([place]) 2017   calf left - probably due to [facility] pills-took eliquis x3 mos, nonthing now  . High cholesterol   . Impingement syndrome of right shoulder 07/2013  . Migraine    "usually 1/month" (03/28/2017)  . PONV (postoperative nausea and vomiting)   . Right bicipital tenosynovitis 07/2013  . Rotator cuff impingement syndrome of right shoulder 07/12/2013  . Seizures ([place])    x 1 as a child - was never on anticonvulsants (03/28/2017)    Past Surgical History:  Procedure Laterality Date  . ADENOIDECTOMY  1981  . ANKLE ARTHROSCOPY Right   . BREAST BIOPSY Left 10/2016  . KNEE ARTHROSCOPY Right   . KNEE ARTHROSCOPY W/ ACL RECONSTRUCTION Left   . LIPOSUCTION WITH LIPOFILLING Bilateral 01/05/2018   Procedure: LIPOFILLING FROM ABDOMEN TO BILATERAL CHEST;  Surgeon: Irene Limbo, MD;  Location: [place];  Service: Plastics;  Laterality: Bilateral;  . MASTECTOMY Left 03/27/2017   NIPPLE SPARING MASTECTOMY WITH RADIOACTIVE SEED TARGETED LYMPH NODE EXCISION AND LEFT AXILLARY SENTINEL LYMPH NODE BIOPSY  . MASTECTOMY Right 03/27/2017   RIGHT PROPHYLACTIC NIPPLE SPARING MASTECTOMY  . NIPPLE SPARING MASTECTOMY Right 03/27/2017   Procedure: RIGHT PROPHYLACTIC NIPPLE SPARING MASTECTOMY;  Surgeon: Rolm Bookbinder, MD;  Location: [place];  Service: General;  Laterality: Right;  . PORT-A-CATH REMOVAL Right 01/05/2018   Procedure: REMOVAL RIGHT CHEST PORT;  Surgeon: Irene Limbo, MD;  Location: [place];  Service: Plastics;  Laterality: Right;  . PORTACATH PLACEMENT N/A 11/01/2016   Procedure: INSERTION PORT-A-CATH WITH [place];  Surgeon: Rolm Bookbinder, MD;  Location: [place];  Service: General;  Laterality: N/A;  . PORTACATH PLACEMENT N/A 08/23/2018   Procedure: INSERTION PORT-A-CATH WITH ULTRASOUND;  Surgeon: Rolm Bookbinder, MD;  Location: [place];  Service: General;  Laterality: N/A;  . RADIOACTIVE SEED GUIDED AXILLARY SENTINEL LYMPH NODE Left 03/27/2017   Procedure: LEFT NIPPLE SPARING MASTECTOMY WITH RADIOACTIVE SEED TARGETED LYMPH NODE EXCISION AND LEFT AXILLARY SENTINEL LYMPH NODE BIOPSY;  Surgeon: Rolm Bookbinder, MD;  Location: [place];  Service: General;  Laterality: Left;  REQUESTS RNFA  . RECONSTRUCTION BREAST IMMEDIATE / DELAYED W/ TISSUE EXPANDER Bilateral 03/27/2017   BILATERAL BREAST RECONSTRUCTION WITH PLACEMENT OF TISSUE EXPANDER AND ALLODERM  . REMOVAL OF BILATERAL TISSUE EXPANDERS WITH PLACEMENT OF BILATERAL BREAST IMPLANTS Bilateral 01/05/2018   Procedure: REMOVAL OF BILATERAL TISSUE EXPANDERS WITH PLACEMENT OF BILATERAL BREAST IMPLANTS;  Surgeon: Irene Limbo, MD;  Location: [place];  Service: Plastics;  Laterality: Bilateral;  . SEPTOPLASTY WITH ETHMOIDECTOMY, AND MAXILLARY ANTROSTOMY  10/29/2010   bilat. max. antrostomy with left max.  stripping; left ant. ethmoidectomy; right total ethmoidectomy; sphenoidotomy  . SHOULDER ARTHROSCOPY WITH SUBACROMIAL DECOMPRESSION AND BICEP TENDON REPAIR Right 07/12/2013   Procedure: RIGHT SHOULDER ARTHROSCOPY DEBRIDEMENT EXTENSIVE DECOMPRESSION SUBACROMIAL PARTIAL ACROMIOPLASTY;  Surgeon: Johnny Bridge, MD;  Location: [place];  Service: Orthopedics;  Laterality: Right;  . WRIST ARTHROSCOPY  01/17/2012   Procedure: ARTHROSCOPY WRIST; right wrist Surgeon: Tennis Must, MD;  Location: [place];  Service: Orthopedics;  Laterality:  Right;  RIGHT WRIST ARTHROSCOPY WITH TRIANGULAR FIBROCARTILAGE COMPLEX REPAIR AND DEBRIDEMENT     Family History  Problem Relation Age of Onset  . Breast cancer Mother 70       triple negative  . Leukemia Father   . Lung cancer Father   . Heart attack Maternal Uncle   . Prostate cancer Paternal Uncle   . COPD Paternal Grandmother   . Heart disease Paternal Grandfather   . Prostate cancer Paternal Uncle   . Leukemia Cousin     Social History   Socioeconomic History  . Marital status: Married    Spouse name: Not on file  . Number of children: Not on file  . Years of education: Not on file  . Highest education level: Not on file  Occupational History  . Not on file  Tobacco Use  . Smoking status: Never Smoker  . Smokeless tobacco: Never Used  Substance and Sexual Activity  . Alcohol use: Yes    Comment: Drinks very rare  . Drug use: No  . Sexual activity: Yes    Birth control/protection: Surgical    Comment: husband has had a vasectomy  Other Topics Concern  . Not on file  Social History Narrative  . Not on file   Social Determinants of Health   Financial Resource Strain:   . Difficulty of Paying Living Expenses:   Food Insecurity:   . Worried About Charity fundraiser in the Last Year:   . Arboriculturist in the Last Year:   Transportation Needs:   . Film/video editor (Medical):   Marland Kitchen Lack of Transportation (Non-Medical):   Physical Activity:   . Days of Exercise per Week:   . Minutes of Exercise per Session:   Stress:   . Feeling of Stress :   Social Connections:   . Frequency of Communication with Friends and Family:   . Frequency of Social Gatherings with Friends and Family:   . Attends Religious Services:   . Active Member of Clubs or Organizations:   . Attends Archivist Meetings:   Marland Kitchen Marital Status:   Intimate Partner Violence:   . Fear of Current or Ex-Partner:   . Emotionally Abused:   Marland Kitchen Physically Abused:   . Sexually Abused:      Past Medical History, Surgical history, Social history, and Family history were reviewed and updated as appropriate.   Please see review of systems for further details on the patient's review from today.   Review of Systems:  Review of Systems  Constitutional: Positive for diaphoresis. Negative for chills, fatigue and fever.  HENT: Positive for ear pain and sore throat. Negative for congestion, postnasal drip and rhinorrhea.   Eyes: Positive for pain.  Respiratory: Positive for cough. Negative for shortness of breath and wheezing.   Cardiovascular: Negative for palpitations.  Neurological: Positive for headaches.    Objective:   Physical Exam:  BP 114/82 (BP Location: Right Arm, Patient Position: Sitting)   Pulse (!) 115  Temp 98.2 F (36.8 C) (Temporal)   Resp 18   Ht 5\' 7"  (1.702 m)   Wt 166 lb 6.4 oz (75.5 kg)   SpO2 96%   BMI 26.06 kg/m  ECOG: 1  Physical Exam Constitutional:      General: She is not in acute distress.    Appearance: She is not diaphoretic.  HENT:     Head: Normocephalic.      Right Ear: External ear normal.     Left Ear: External ear normal.     Mouth/Throat:     Pharynx: No oropharyngeal exudate.  Cardiovascular:     Rate and Rhythm: Regular rhythm. Tachycardia present.     Heart sounds: Normal heart sounds. No murmur. No friction rub. No gallop.   Pulmonary:     Effort: Pulmonary effort is normal. No respiratory distress.     Breath sounds: Examination of the left-lower field reveals decreased breath sounds. Decreased breath sounds present. No wheezing or rales.    Musculoskeletal:     Cervical back: Normal range of motion and neck supple.  Lymphadenopathy:     Cervical: No cervical adenopathy.  Skin:    General: Skin is warm and dry.     Findings: No erythema or rash.  Neurological:     Mental Status: She is alert.     Coordination: Coordination normal.  Psychiatric:        Behavior: Behavior normal.        Thought Content:  Thought content normal.        Judgment: Judgment normal.     Lab Review:     Component Value Date/Time   NA 137 05/29/2019 0900   NA 138 01/27/2017 0802   K 4.1 05/29/2019 0900   K 4.1 01/27/2017 0802   CL 102 05/29/2019 0900   CO2 28 05/29/2019 0900   CO2 26 01/27/2017 0802   GLUCOSE 91 05/29/2019 0900   GLUCOSE 90 01/27/2017 0802   BUN 15 05/29/2019 0900   BUN 14.0 01/27/2017 0802   CREATININE 0.77 05/29/2019 0900   CREATININE 0.7 01/27/2017 0802   CALCIUM 8.9 05/29/2019 0900   CALCIUM 8.6 01/27/2017 0802   PROT 7.0 05/29/2019 0900   PROT 6.5 01/27/2017 0802   ALBUMIN 3.1 (L) 05/29/2019 0900   ALBUMIN 3.5 01/27/2017 0802   AST 23 05/29/2019 0900   AST 22 01/27/2017 0802   ALT 52 (H) 05/29/2019 0900   ALT 23 01/27/2017 0802   ALKPHOS 133 (H) 05/29/2019 0900   ALKPHOS 85 01/27/2017 0802   BILITOT 0.2 (L) 05/29/2019 0900   BILITOT 0.23 01/27/2017 0802   GFRNONAA >60 05/29/2019 0900   GFRAA >60 05/29/2019 0900       Component Value Date/Time   WBC 4.2 05/29/2019 0900   WBC 11.0 (H) 01/04/2019 1649   RBC 3.30 (L) 05/29/2019 0900   HGB 10.6 (L) 05/29/2019 0900   HGB 11.1 (L) 01/27/2017 0802   HCT 33.9 (L) 05/29/2019 0900   HCT 33.4 (L) 01/27/2017 0802   PLT 153 05/29/2019 0900   PLT 170 01/27/2017 0802   MCV 102.7 (H) 05/29/2019 0900   MCV 99.0 01/27/2017 0802   MCH 32.1 05/29/2019 0900   MCHC 31.3 05/29/2019 0900   RDW 17.0 (H) 05/29/2019 0900   RDW 18.2 (H) 01/27/2017 0802   LYMPHSABS 0.9 05/29/2019 0900   LYMPHSABS 1.7 01/27/2017 0802   MONOABS 0.6 05/29/2019 0900   MONOABS 0.5 01/27/2017 0802   EOSABS 0.0 05/29/2019 0900  EOSABS 0.0 01/27/2017 0802   BASOSABS 0.0 05/29/2019 0900   BASOSABS 0.0 01/27/2017 0802   -------------------------------  Imaging from last 24 hours (if applicable):  Radiology interpretation: DG Chest 2 View  Result Date: 06/07/2019 CLINICAL DATA:  Cough.  Metastatic breast carcinoma EXAM: CHEST - 2 VIEW COMPARISON:  Chest  CT May 28, 2019; chest radiograph September 14, 2018 FINDINGS: There are scattered pulmonary nodular opacities bilaterally, similar to most recent study. There is ill-defined areas of apparent scarring atelectasis, similar to recent CT examination. No new opacity evident. The heart size and pulmonary vascularity are normal. No adenopathy is appreciable by radiography. Port-A-Cath tip is in the superior vena cava. Bony metastases seen on CT are not well visualized by radiography. Healing fracture right eighth rib noted. IMPRESSION: Pulmonary nodular lesions, grossly stable compared to recent CT. Scarring and atelectasis in left lower lobe. No frank consolidation. Stable cardiac silhouette. No appreciable adenopathy. Port-A-Cath tip in superior vena cava. Electronically Signed   By: Lowella Grip III M.D.   On: 06/07/2019 11:54   CT CHEST W CONTRAST  Result Date: 05/28/2019 CLINICAL DATA:  Metastatic breast cancer to lungs and skeleton, ongoing chemotherapy. Radiation therapy 01/21/2019. Constipation. EXAM: CT CHEST, ABDOMEN, AND PELVIS WITH CONTRAST TECHNIQUE: Multidetector CT imaging of the chest, abdomen and pelvis was performed following the standard protocol during bolus administration of intravenous contrast. CONTRAST:  164mL OMNIPAQUE IOHEXOL 300 MG/ML  SOLN COMPARISON:  Multiple exams, including 04/05/2019 and 04/01/2019 FINDINGS: CT CHEST FINDINGS Cardiovascular: Right Port-A-Cath tip: SVC. Mediastinum/Nodes: Unremarkable Lungs/Pleura: Scattered volume loss in the lungs likely due to a combination of scarring and atelectasis. Index left upper lobe nodule 1.3 by 1.2 cm on image 49/7, previously by my measurements 1.5 by 1.6 cm. A dominant lesion in the right middle lobe measures 1.6 by 1.4 cm on image 98/7, previously 1.9 by 1.7 cm. Other dominant nodules appear commensurate [place] reduced. There scattered small pulmonary nodules throughout both lungs. Improved lung volumes overall compared to the prior  exam. Musculoskeletal: Bilateral breast implants. Stable lucent lesion along the right sternum on image 68/7. Stable healing fracture of the right posterolateral eighth rib with underlying lucency centrally. Stable appearance of the lucent lesion in the T12 vertebral body measuring 1.1 cm craniocaudad. CT ABDOMEN PELVIS FINDINGS Hepatobiliary: Unremarkable Pancreas: Unremarkable Spleen: Unremarkable Adrenals/Urinary Tract: Unremarkable Stomach/Bowel: Prominent stool throughout the colon favors constipation. Vascular/Lymphatic: Unremarkable Reproductive: Unremarkable Other: No supplemental non-categorized findings. Musculoskeletal: Stable lytic lesions in the left S1 vertebra, in the left iliac bone, and in the right inferior pubic ramus. Lucencies along the endplates at L4 are probably from Schmorl's nodes. There is [place] eccentric to the left at L3 is probably from a hemangioma. IMPRESSION: 1. Reduction in size of the dominant pulmonary nodules. 2. Stable lytic lesions in the right sternum, T12 vertebral body, left iliac bone, sacrum, and right inferior pubic ramus. Stable healing fracture of the right posterolateral eighth rib. 3. Prominent stool throughout the colon favors constipation. Electronically Signed   By: Ceferino Lang Clines M.D.   On: 05/28/2019 11:08   NM Bone Scan Whole Body  Result Date: 05/28/2019 CLINICAL DATA:  44 year old female with breast cancer metastatic to the lungs and skeleton. EXAM: NUCLEAR MEDICINE WHOLE BODY BONE SCAN TECHNIQUE: Whole body anterior and posterior images were obtained approximately 3 hours after intravenous injection of radiopharmaceutical. RADIOPHARMACEUTICALS:  21 mCi Technetium-6m MDP IV COMPARISON:  CT Chest, Abdomen, and Pelvis today are reported separately. Thoracic spine MRI 01/03/2019. Whole-body bone scan 09/11/2017.  Head MRI 08/02/2018. FINDINGS: Expected radiotracer activity in both kidneys and the urinary bladder. New elongated abnormal radiotracer activity  in the left mandible. Mandible bone marrow signal appeared normal in July last year. New intense radiotracer uptake in the medial right clavicle at the [facility] joint, although the medial right clavicle is degenerated with bulky bone spurring on the CT today. There are several small foci of abnormal rib activity, such as the right posterolateral 8th rib, which corresponds to a healing fracture by CT. No definite abnormal activity elsewhere within the axial skeleton including the calvarium, spine, and pelvis. There is asymmetric increased activity also about the right shoulder and proximal right humerus, mild-to-moderate. Similar subtle increased left proximal humerus shaft activity. Elsewhere visible appendicular skeleton activity is normal. IMPRESSION: 1. New radiotracer activity suspicious for active bone metastases in the left mandible, proximal bilateral humeri. 2. Intense but degenerative appearing activity at the medial right clavicle/sternoclavicular joint. With superimposed more indeterminate activity elsewhere at the right shoulder. 3. Occasional benign rib fracture related activity. Electronically Signed   By: Genevie Ann M.D.   On: 05/28/2019 15:46   CT ABDOMEN PELVIS W CONTRAST  Result Date: 05/28/2019 CLINICAL DATA:  Metastatic breast cancer to lungs and skeleton, ongoing chemotherapy. Radiation therapy 01/21/2019. Constipation. EXAM: CT CHEST, ABDOMEN, AND PELVIS WITH CONTRAST TECHNIQUE: Multidetector CT imaging of the chest, abdomen and pelvis was performed following the standard protocol during bolus administration of intravenous contrast. CONTRAST:  123mL OMNIPAQUE IOHEXOL 300 MG/ML  SOLN COMPARISON:  Multiple exams, including 04/05/2019 and 04/01/2019 FINDINGS: CT CHEST FINDINGS Cardiovascular: Right Port-A-Cath tip: SVC. Mediastinum/Nodes: Unremarkable Lungs/Pleura: Scattered volume loss in the lungs likely due to a combination of scarring and atelectasis. Index left upper lobe nodule 1.3 by 1.2 cm on  image 49/7, previously by my measurements 1.5 by 1.6 cm. A dominant lesion in the right middle lobe measures 1.6 by 1.4 cm on image 98/7, previously 1.9 by 1.7 cm. Other dominant nodules appear commensurate Lea reduced. There scattered small pulmonary nodules throughout both lungs. Improved lung volumes overall compared to the prior exam. Musculoskeletal: Bilateral breast implants. Stable lucent lesion along the right sternum on image 68/7. Stable healing fracture of the right posterolateral eighth rib with underlying lucency centrally. Stable appearance of the lucent lesion in the T12 vertebral body measuring 1.1 cm craniocaudad. CT ABDOMEN PELVIS FINDINGS Hepatobiliary: Unremarkable Pancreas: Unremarkable Spleen: Unremarkable Adrenals/Urinary Tract: Unremarkable Stomach/Bowel: Prominent stool throughout the colon favors constipation. Vascular/Lymphatic: Unremarkable Reproductive: Unremarkable Other: No supplemental non-categorized findings. Musculoskeletal: Stable lytic lesions in the left S1 vertebra, in the left iliac bone, and in the right inferior pubic ramus. Lucencies along the endplates at L4 are probably from Schmorl's nodes. There is [place] eccentric to the left at L3 is probably from a hemangioma. IMPRESSION: 1. Reduction in size of the dominant pulmonary nodules. 2. Stable lytic lesions in the right sternum, T12 vertebral body, left iliac bone, sacrum, and right inferior pubic ramus. Stable healing fracture of the right posterolateral eighth rib. 3. Prominent stool throughout the colon favors constipation. Electronically Signed   By: Kesley Gaffey Clines M.D.   On: 05/28/2019 11:08

## 2019-06-07 NOTE — Progress Notes (Signed)
Pharmacist Chemotherapy Monitoring - Follow Up Assessment    I verify that I have reviewed each item in the below checklist:  . Regimen for the patient is scheduled for the appropriate day and plan matches scheduled date. Marland Kitchen Appropriate non-routine labs are ordered dependent on drug ordered. . If applicable, additional medications reviewed and ordered per protocol based on lifetime cumulative doses and/or treatment regimen.   Plan for follow-up and/or issues identified: No . I-vent associated with next due treatment: No . MD and/or nursing notified: No  Britt Boozer 06/07/2019 7:50 AM

## 2019-06-07 NOTE — Progress Notes (Signed)
Pt seen by PA [name] only, no assessment by [facility] RN (time constraints).  PA [name] aware.

## 2019-06-10 ENCOUNTER — Other Ambulatory Visit: Payer: Self-pay

## 2019-06-10 ENCOUNTER — Inpatient Hospital Stay (HOSPITAL_BASED_OUTPATIENT_CLINIC_OR_DEPARTMENT_OTHER): Payer: 59 | Admitting: Medical

## 2019-06-10 ENCOUNTER — Other Ambulatory Visit: Payer: Self-pay | Admitting: Radiation Therapy

## 2019-06-10 ENCOUNTER — Ambulatory Visit (HOSPITAL_COMMUNITY)
Admission: RE | Admit: 2019-06-10 | Discharge: 2019-06-10 | Disposition: A | Discharge status: Home / Self Care | Payer: 59 | Source: Ambulatory Visit | Attending: Hematology and Oncology | Admitting: Hematology and Oncology

## 2019-06-10 ENCOUNTER — Other Ambulatory Visit: Payer: Self-pay | Admitting: *Deleted

## 2019-06-10 VITALS — BP 133/92 | HR 102 | Temp 98.9°F | Resp 18 | Ht 67.0 in | Wt 168.7 lb

## 2019-06-10 DIAGNOSIS — C50512 Malignant neoplasm of lower-outer quadrant of left female breast: Secondary | ICD-10-CM | POA: Diagnosis not present

## 2019-06-10 DIAGNOSIS — R29818 Other symptoms and signs involving the nervous system: Secondary | ICD-10-CM | POA: Insufficient documentation

## 2019-06-10 DIAGNOSIS — C7931 Secondary malignant neoplasm of brain: Secondary | ICD-10-CM

## 2019-06-10 DIAGNOSIS — R42 Dizziness and giddiness: Secondary | ICD-10-CM | POA: Diagnosis not present

## 2019-06-10 DIAGNOSIS — C50919 Malignant neoplasm of unspecified site of unspecified female breast: Secondary | ICD-10-CM | POA: Diagnosis not present

## 2019-06-10 MED ORDER — GADOBUTROL 1 MMOL/ML IV SOLN
7.0000 mL | Freq: Once | INTRAVENOUS | Status: AC | PRN
  Administered 2019-06-10: 7 mL via INTRAVENOUS

## 2019-06-10 MED ORDER — LORAZEPAM 0.5 MG PO TABS: 0.5000 mg | ORAL_TABLET | Freq: Four times a day (QID) | ORAL | 1 refills | Status: DC | PRN

## 2019-06-10 NOTE — Progress Notes (Signed)
Pt called stating she had been having loss of balance and frequent falls the last couple weeks. Per Dr.Gudena, order for STAT MRI scheduled at [facility] at 1130am and [facility] visit afterwards. Pt called and made aware of appt's. Pt verbalized understanding

## 2019-06-10 NOTE — Progress Notes (Signed)
[facility] Progress Note   Laura Anthony [id number] [birth year] 44 y.o.  Laura Anthony is managed by Dr. Nicholas Lose  Actively treated with chemotherapy/immunotherapy/hormonal therapy: yes  Current therapy: sacituzumab govitecan-hziy (TRODELVY) IV with Neulasta support   Last treated: 05/29/2019 (cycle #3, day #8)  Next scheduled appointment with provider: 06/13/2019  Assessment: Plan:    Dizziness  Metastatic breast cancer ([place])   Dizziness: Ms. [name] was referred for a brain MRI which returned showing:  FINDINGS: Brain: There is a new 5 mm focus of enhancement at the gray-white junction in the posterior left frontal lobe (series 18, image 45) without associated edema or restricted diffusion.  No abnormal brain parenchymal or meningeal enhancement suggestive of metastatic disease is seen elsewhere.  A small developmental venous anomaly is incidentally noted in the left parietal lobe.  The brain is otherwise normal in signal.  No acute infarct, intracranial hemorrhage, midline shift, or extra-axial fluid collection is evident.  The ventricles and sulci are normal.  Vascular: Major intracranial vascular flow voids are preserved.  Skull and upper cervical spine: Abnormal marrow signal in C3 corresponding to a known metastasis.  No suspicious skull lesion.  Sinuses/Orbits: Unremarkable orbits. Paranasal sinuses and mastoid air cells are clear.  Other: None.  IMPRESSION: 1. New 5 mm enhancing lesion in the posterior left frontal lobe most concerning for a solitary metastasis. No edema. 2. Otherwise unremarkable appearance of the brain.   Metastatic breast cancer: The patient continues to be managed by Dr. Nicholas Lose and is status post cycle 3, day 8 of Trodelvy with Neulasta support. A brain MRI returned showing a new 5 mm enhancing lesion in the posterior left frontal lobe most concerning for a solitary metastasis.  Radiation Oncology was contacted and will be seeing the patient later this week. Today's finding have precipitated anxiety in the patient. She was given Ativan 0.5 mg PO q 6 hours as needed for anxiety.    Please see After Visit Summary for patient specific instructions.  Future Appointments  Date Time Provider [place]  06/10/2019 12:00 PM [facility] 1 [facility]  06/10/2019  1:00 PM Nguyen Butler, Lyndon Code., PA-C [facility] None  06/13/2019 10:45 AM [facility] LAB/FLUSH [facility] None  06/13/2019 11:00 AM [facility] [place] FLUSH [facility] None  06/13/2019 11:45 AM Nicholas Lose, MD [facility] None  06/13/2019 12:30 PM [facility] INFUSION [facility] None  06/20/2019 10:45 AM [facility] LAB/FLUSH [facility] None  06/20/2019 11:00 AM [facility] [place] FLUSH [facility] None  06/20/2019 11:45 AM Nicholas Lose, MD [facility] None  06/20/2019 12:30 PM [facility] INFUSION [facility] None    No orders of the defined types were placed in this encounter.      Subjective:   Patient ID:  Laura Anthony is a 44 y.o. (DOB [birth year]) female.  Chief Complaint:  No chief complaint on file.   HPI Laura Anthony is a 44 y.o. female with a diagnosis of a metastatic breast cancer. She continues to be managed by Dr. Nicholas Lose and is status post cycle 3, day 8 of Trodelvy with Neulasta support which was dosed on 05/29/2019.  She was most recently seen on 06/07/2019 and was diagnosed with a possible sinus infection and a right eustachian tube dysfunction. She was treated with Bactrim, Afrin and Flonase nasal spray. She had "tripped" on her patio and fell. He hit her left cheek on the edge of a window seal which caused a abrasion of the left cheek. She presents today with dizziness and a  report of recurrent falls. She was referred for a brain MRI [place] today which returned showing:  FINDINGS: Brain: There is a new 5 mm focus of enhancement at the gray-white junction in the posterior left frontal lobe  (series 18, image 45) without associated edema or restricted diffusion.  No abnormal brain parenchymal or meningeal enhancement suggestive of metastatic disease is seen elsewhere.  A small developmental venous anomaly is incidentally noted in the left parietal lobe.  The brain is otherwise normal in signal.  No acute infarct, intracranial hemorrhage, midline shift, or extra-axial fluid collection is evident.  The ventricles and sulci are normal.  Vascular: Major intracranial vascular flow voids are preserved.  Skull and upper cervical spine: Abnormal marrow signal in C3 corresponding to a known metastasis.  No suspicious skull lesion.  Sinuses/Orbits: Unremarkable orbits. Paranasal sinuses and mastoid air cells are clear.  Other: None.  IMPRESSION: 1. New 5 mm enhancing lesion in the posterior left frontal lobe most concerning for a solitary metastasis. No edema. 2. Otherwise unremarkable appearance of the brain.   Medications: I have reviewed the patient's current medications.  Allergies:  Allergies  Allergen Reactions  . Sumatriptan Other (See Comments)    Numbness to face   . Statins Other (See Comments)    Leg pain  . Tape Rash and Other (See Comments)    Rash from dressing over port-a-cath     Past Medical History:  Diagnosis Date  . ADD (attention deficit disorder)   . Anemia   . Anxiety   . Breast cancer, left breast ([place])    S/P mastectomy 03/27/2017  . DVT (deep venous thrombosis) ([place]) 2017   calf left - probably due to [facility] pills-took eliquis x3 mos, nonthing now  . High cholesterol   . Impingement syndrome of right shoulder 07/2013  . Migraine    "usually 1/month" (03/28/2017)  . PONV (postoperative nausea and vomiting)   . Right bicipital tenosynovitis 07/2013  . Rotator cuff impingement syndrome of right shoulder 07/12/2013  . Seizures ([place])    x 1 as a child - was never on anticonvulsants (03/28/2017)    Past Surgical History:  Procedure  Laterality Date  . ADENOIDECTOMY  1981  . ANKLE ARTHROSCOPY Right   . BREAST BIOPSY Left 10/2016  . KNEE ARTHROSCOPY Right   . KNEE ARTHROSCOPY W/ ACL RECONSTRUCTION Left   . LIPOSUCTION WITH LIPOFILLING Bilateral 01/05/2018   Procedure: LIPOFILLING FROM ABDOMEN TO BILATERAL CHEST;  Surgeon: Irene Limbo, MD;  Location: [place];  Service: Plastics;  Laterality: Bilateral;  . MASTECTOMY Left 03/27/2017   NIPPLE SPARING MASTECTOMY WITH RADIOACTIVE SEED TARGETED LYMPH NODE EXCISION AND LEFT AXILLARY SENTINEL LYMPH NODE BIOPSY  . MASTECTOMY Right 03/27/2017   RIGHT PROPHYLACTIC NIPPLE SPARING MASTECTOMY  . NIPPLE SPARING MASTECTOMY Right 03/27/2017   Procedure: RIGHT PROPHYLACTIC NIPPLE SPARING MASTECTOMY;  Surgeon: Rolm Bookbinder, MD;  Location: [place];  Service: General;  Laterality: Right;  . PORT-A-CATH REMOVAL Right 01/05/2018   Procedure: REMOVAL RIGHT CHEST PORT;  Surgeon: Irene Limbo, MD;  Location: [place];  Service: Plastics;  Laterality: Right;  . PORTACATH PLACEMENT N/A 11/01/2016   Procedure: INSERTION PORT-A-CATH WITH [place];  Surgeon: Rolm Bookbinder, MD;  Location: [place];  Service: General;  Laterality: N/A;  . PORTACATH PLACEMENT N/A 08/23/2018   Procedure: INSERTION PORT-A-CATH WITH ULTRASOUND;  Surgeon: Rolm Bookbinder, MD;  Location: [place];  Service: General;  Laterality: N/A;  . RADIOACTIVE SEED GUIDED  AXILLARY SENTINEL LYMPH NODE Left 03/27/2017   Procedure: LEFT NIPPLE SPARING MASTECTOMY WITH RADIOACTIVE SEED TARGETED LYMPH NODE EXCISION AND LEFT AXILLARY SENTINEL LYMPH NODE BIOPSY;  Surgeon: Rolm Bookbinder, MD;  Location: [place];  Service: General;  Laterality: Left;  REQUESTS RNFA  . RECONSTRUCTION BREAST IMMEDIATE / DELAYED W/ TISSUE EXPANDER Bilateral 03/27/2017   BILATERAL BREAST RECONSTRUCTION WITH PLACEMENT OF TISSUE EXPANDER AND ALLODERM  . REMOVAL OF  BILATERAL TISSUE EXPANDERS WITH PLACEMENT OF BILATERAL BREAST IMPLANTS Bilateral 01/05/2018   Procedure: REMOVAL OF BILATERAL TISSUE EXPANDERS WITH PLACEMENT OF BILATERAL BREAST IMPLANTS;  Surgeon: Irene Limbo, MD;  Location: [place];  Service: Plastics;  Laterality: Bilateral;  . SEPTOPLASTY WITH ETHMOIDECTOMY, AND MAXILLARY ANTROSTOMY  10/29/2010   bilat. max. antrostomy with left max. stripping; left ant. ethmoidectomy; right total ethmoidectomy; sphenoidotomy  . SHOULDER ARTHROSCOPY WITH SUBACROMIAL DECOMPRESSION AND BICEP TENDON REPAIR Right 07/12/2013   Procedure: RIGHT SHOULDER ARTHROSCOPY DEBRIDEMENT EXTENSIVE DECOMPRESSION SUBACROMIAL PARTIAL ACROMIOPLASTY;  Surgeon: Johnny Bridge, MD;  Location: [place];  Service: Orthopedics;  Laterality: Right;  . WRIST ARTHROSCOPY  01/17/2012   Procedure: ARTHROSCOPY WRIST; right wrist Surgeon: Tennis Must, MD;  Location: [place];  Service: Orthopedics;  Laterality: Right;  RIGHT WRIST ARTHROSCOPY WITH TRIANGULAR FIBROCARTILAGE COMPLEX REPAIR AND DEBRIDEMENT     Family History  Problem Relation Age of Onset  . Breast cancer Mother 75       triple negative  . Leukemia Father   . Lung cancer Father   . Heart attack Maternal Uncle   . Prostate cancer Paternal Uncle   . COPD Paternal Grandmother   . Heart disease Paternal Grandfather   . Prostate cancer Paternal Uncle   . Leukemia Cousin     Social History   Socioeconomic History  . Marital status: Married    Spouse name: Not on file  . Number of children: Not on file  . Years of education: Not on file  . Highest education level: Not on file  Occupational History  . Not on file  Tobacco Use  . Smoking status: Never Smoker  . Smokeless tobacco: Never Used  Substance and Sexual Activity  . Alcohol use: Yes    Comment: Drinks very rare  . Drug use: No  . Sexual activity: Yes    Birth control/protection: Surgical    Comment:  husband has had a vasectomy  Other Topics Concern  . Not on file  Social History Narrative  . Not on file   Social Determinants of Health   Financial Resource Strain:   . Difficulty of Paying Living Expenses:   Food Insecurity:   . Worried About Charity fundraiser in the Last Year:   . Arboriculturist in the Last Year:   Transportation Needs:   . Film/video editor (Medical):   Marland Kitchen Lack of Transportation (Non-Medical):   Physical Activity:   . Days of Exercise per Week:   . Minutes of Exercise per Session:   Stress:   . Feeling of Stress :   Social Connections:   . Frequency of Communication with Friends and Family:   . Frequency of Social Gatherings with Friends and Family:   . Attends Religious Services:   . Active Member of Clubs or Organizations:   . Attends Archivist Meetings:   Marland Kitchen Marital Status:   Intimate Partner Violence:   . Fear of Current or Ex-Partner:   . Emotionally Abused:   .  Physically Abused:   . Sexually Abused:     Past Medical History, Surgical history, Social history, and Family history were reviewed and updated as appropriate.   Please see review of systems for further details on the patient's review from today.   Review of Systems:  Review of Systems  Constitutional: Negative for appetite change, chills, diaphoresis and fever.       Recurrent falls.  HENT: Negative for dental problem, mouth sores and trouble swallowing.   Respiratory: Negative for cough, chest tightness and shortness of breath.   Cardiovascular: Negative for chest pain and palpitations.  Gastrointestinal: Negative for constipation, diarrhea, nausea and vomiting.  Neurological: Positive for dizziness. Negative for syncope, weakness and headaches.    Objective:   Physical Exam:  There were no vitals taken for this visit. ECOG: 1  Physical Exam Constitutional:      General: She is not in acute distress.    Appearance: She is not diaphoretic.  HENT:      Head: Normocephalic.     Comments: Healing abrasion over the left cheek. Skin:    General: Skin is warm and dry.     Findings: Lesion present. No erythema or rash.     Comments: Abrasion of the right knee and right posterior elbow.  Neurological:     Mental Status: She is alert.  Psychiatric:     Comments: Patient is anxious.     Lab Review:     Component Value Date/Time   NA 137 05/29/2019 0900   NA 138 01/27/2017 0802   K 4.1 05/29/2019 0900   K 4.1 01/27/2017 0802   CL 102 05/29/2019 0900   CO2 28 05/29/2019 0900   CO2 26 01/27/2017 0802   GLUCOSE 91 05/29/2019 0900   GLUCOSE 90 01/27/2017 0802   BUN 15 05/29/2019 0900   BUN 14.0 01/27/2017 0802   CREATININE 0.77 05/29/2019 0900   CREATININE 0.7 01/27/2017 0802   CALCIUM 8.9 05/29/2019 0900   CALCIUM 8.6 01/27/2017 0802   PROT 7.0 05/29/2019 0900   PROT 6.5 01/27/2017 0802   ALBUMIN 3.1 (L) 05/29/2019 0900   ALBUMIN 3.5 01/27/2017 0802   AST 23 05/29/2019 0900   AST 22 01/27/2017 0802   ALT 52 (H) 05/29/2019 0900   ALT 23 01/27/2017 0802   ALKPHOS 133 (H) 05/29/2019 0900   ALKPHOS 85 01/27/2017 0802   BILITOT 0.2 (L) 05/29/2019 0900   BILITOT 0.23 01/27/2017 0802   GFRNONAA >60 05/29/2019 0900   GFRAA >60 05/29/2019 0900       Component Value Date/Time   WBC 4.2 05/29/2019 0900   WBC 11.0 (H) 01/04/2019 1649   RBC 3.30 (L) 05/29/2019 0900   HGB 10.6 (L) 05/29/2019 0900   HGB 11.1 (L) 01/27/2017 0802   HCT 33.9 (L) 05/29/2019 0900   HCT 33.4 (L) 01/27/2017 0802   PLT 153 05/29/2019 0900   PLT 170 01/27/2017 0802   MCV 102.7 (H) 05/29/2019 0900   MCV 99.0 01/27/2017 0802   MCH 32.1 05/29/2019 0900   MCHC 31.3 05/29/2019 0900   RDW 17.0 (H) 05/29/2019 0900   RDW 18.2 (H) 01/27/2017 0802   LYMPHSABS 0.9 05/29/2019 0900   LYMPHSABS 1.7 01/27/2017 0802   MONOABS 0.6 05/29/2019 0900   MONOABS 0.5 01/27/2017 0802   EOSABS 0.0 05/29/2019 0900   EOSABS 0.0 01/27/2017 0802   BASOSABS 0.0 05/29/2019 0900     BASOSABS 0.0 01/27/2017 0802   -------------------------------  Imaging from last 24 hours (if  applicable):  Radiology interpretation: DG Chest 2 View  Result Date: 06/07/2019 CLINICAL DATA:  Cough.  Metastatic breast carcinoma EXAM: CHEST - 2 VIEW COMPARISON:  Chest CT May 28, 2019; chest radiograph September 14, 2018 FINDINGS: There are scattered pulmonary nodular opacities bilaterally, similar to most recent study. There is ill-defined areas of apparent scarring atelectasis, similar to recent CT examination. No new opacity evident. The heart size and pulmonary vascularity are normal. No adenopathy is appreciable by radiography. Port-A-Cath tip is in the superior vena cava. Bony metastases seen on CT are not well visualized by radiography. Healing fracture right eighth rib noted. IMPRESSION: Pulmonary nodular lesions, grossly stable compared to recent CT. Scarring and atelectasis in left lower lobe. No frank consolidation. Stable cardiac silhouette. No appreciable adenopathy. Port-A-Cath tip in superior vena cava. Electronically Signed   By: Lowella Grip III M.D.   On: 06/07/2019 11:54   CT CHEST W CONTRAST  Result Date: 05/28/2019 CLINICAL DATA:  Metastatic breast cancer to lungs and skeleton, ongoing chemotherapy. Radiation therapy 01/21/2019. Constipation. EXAM: CT CHEST, ABDOMEN, AND PELVIS WITH CONTRAST TECHNIQUE: Multidetector CT imaging of the chest, abdomen and pelvis was performed following the standard protocol during bolus administration of intravenous contrast. CONTRAST:  167mL OMNIPAQUE IOHEXOL 300 MG/ML  SOLN COMPARISON:  Multiple exams, including 04/05/2019 and 04/01/2019 FINDINGS: CT CHEST FINDINGS Cardiovascular: Right Port-A-Cath tip: SVC. Mediastinum/Nodes: Unremarkable Lungs/Pleura: Scattered volume loss in the lungs likely due to a combination of scarring and atelectasis. Index left upper lobe nodule 1.3 by 1.2 cm on image 49/7, previously by my measurements 1.5 by 1.6 cm. A  dominant lesion in the right middle lobe measures 1.6 by 1.4 cm on image 98/7, previously 1.9 by 1.7 cm. Other dominant nodules appear commensurate [place] reduced. There scattered small pulmonary nodules throughout both lungs. Improved lung volumes overall compared to the prior exam. Musculoskeletal: Bilateral breast implants. Stable lucent lesion along the right sternum on image 68/7. Stable healing fracture of the right posterolateral eighth rib with underlying lucency centrally. Stable appearance of the lucent lesion in the T12 vertebral body measuring 1.1 cm craniocaudad. CT ABDOMEN PELVIS FINDINGS Hepatobiliary: Unremarkable Pancreas: Unremarkable Spleen: Unremarkable Adrenals/Urinary Tract: Unremarkable Stomach/Bowel: Prominent stool throughout the colon favors constipation. Vascular/Lymphatic: Unremarkable Reproductive: Unremarkable Other: No supplemental non-categorized findings. Musculoskeletal: Stable lytic lesions in the left S1 vertebra, in the left iliac bone, and in the right inferior pubic ramus. Lucencies along the endplates at L4 are probably from Schmorl's nodes. There is [place] eccentric to the left at L3 is probably from a hemangioma. IMPRESSION: 1. Reduction in size of the dominant pulmonary nodules. 2. Stable lytic lesions in the right sternum, T12 vertebral body, left iliac bone, sacrum, and right inferior pubic ramus. Stable healing fracture of the right posterolateral eighth rib. 3. Prominent stool throughout the colon favors constipation. Electronically Signed   By: Sharae Zappulla Clines M.D.   On: 05/28/2019 11:08   NM Bone Scan Whole Body  Result Date: 05/28/2019 CLINICAL DATA:  44 year old female with breast cancer metastatic to the lungs and skeleton. EXAM: NUCLEAR MEDICINE WHOLE BODY BONE SCAN TECHNIQUE: Whole body anterior and posterior images were obtained approximately 3 hours after intravenous injection of radiopharmaceutical. RADIOPHARMACEUTICALS:  21 mCi Technetium-45m MDP IV  COMPARISON:  CT Chest, Abdomen, and Pelvis today are reported separately. Thoracic spine MRI 01/03/2019. Whole-body bone scan 09/11/2017.  Head MRI 08/02/2018. FINDINGS: Expected radiotracer activity in both kidneys and the urinary bladder. New elongated abnormal radiotracer activity in the left mandible. Mandible bone  marrow signal appeared normal in July last year. New intense radiotracer uptake in the medial right clavicle at the [facility] joint, although the medial right clavicle is degenerated with bulky bone spurring on the CT today. There are several small foci of abnormal rib activity, such as the right posterolateral 8th rib, which corresponds to a healing fracture by CT. No definite abnormal activity elsewhere within the axial skeleton including the calvarium, spine, and pelvis. There is asymmetric increased activity also about the right shoulder and proximal right humerus, mild-to-moderate. Similar subtle increased left proximal humerus shaft activity. Elsewhere visible appendicular skeleton activity is normal. IMPRESSION: 1. New radiotracer activity suspicious for active bone metastases in the left mandible, proximal bilateral humeri. 2. Intense but degenerative appearing activity at the medial right clavicle/sternoclavicular joint. With superimposed more indeterminate activity elsewhere at the right shoulder. 3. Occasional benign rib fracture related activity. Electronically Signed   By: Genevie Ann M.D.   On: 05/28/2019 15:46   CT ABDOMEN PELVIS W CONTRAST  Result Date: 05/28/2019 CLINICAL DATA:  Metastatic breast cancer to lungs and skeleton, ongoing chemotherapy. Radiation therapy 01/21/2019. Constipation. EXAM: CT CHEST, ABDOMEN, AND PELVIS WITH CONTRAST TECHNIQUE: Multidetector CT imaging of the chest, abdomen and pelvis was performed following the standard protocol during bolus administration of intravenous contrast. CONTRAST:  125mL OMNIPAQUE IOHEXOL 300 MG/ML  SOLN COMPARISON:  Multiple exams,  including 04/05/2019 and 04/01/2019 FINDINGS: CT CHEST FINDINGS Cardiovascular: Right Port-A-Cath tip: SVC. Mediastinum/Nodes: Unremarkable Lungs/Pleura: Scattered volume loss in the lungs likely due to a combination of scarring and atelectasis. Index left upper lobe nodule 1.3 by 1.2 cm on image 49/7, previously by my measurements 1.5 by 1.6 cm. A dominant lesion in the right middle lobe measures 1.6 by 1.4 cm on image 98/7, previously 1.9 by 1.7 cm. Other dominant nodules appear commensurate Lea reduced. There scattered small pulmonary nodules throughout both lungs. Improved lung volumes overall compared to the prior exam. Musculoskeletal: Bilateral breast implants. Stable lucent lesion along the right sternum on image 68/7. Stable healing fracture of the right posterolateral eighth rib with underlying lucency centrally. Stable appearance of the lucent lesion in the T12 vertebral body measuring 1.1 cm craniocaudad. CT ABDOMEN PELVIS FINDINGS Hepatobiliary: Unremarkable Pancreas: Unremarkable Spleen: Unremarkable Adrenals/Urinary Tract: Unremarkable Stomach/Bowel: Prominent stool throughout the colon favors constipation. Vascular/Lymphatic: Unremarkable Reproductive: Unremarkable Other: No supplemental non-categorized findings. Musculoskeletal: Stable lytic lesions in the left S1 vertebra, in the left iliac bone, and in the right inferior pubic ramus. Lucencies along the endplates at L4 are probably from Schmorl's nodes. There is [place] eccentric to the left at L3 is probably from a hemangioma. IMPRESSION: 1. Reduction in size of the dominant pulmonary nodules. 2. Stable lytic lesions in the right sternum, T12 vertebral body, left iliac bone, sacrum, and right inferior pubic ramus. Stable healing fracture of the right posterolateral eighth rib. 3. Prominent stool throughout the colon favors constipation. Electronically Signed   By: Aerion Bagdasarian Clines M.D.   On: 05/28/2019 11:08        This case was discussed with  Dr. Lindi Adie. He expresses agreement with my management of this patient.

## 2019-06-10 NOTE — Patient Instructions (Signed)
Magnetic Resonance Imaging Magnetic resonance imaging (MRI) is a painless test that produces images of the inside of the body without using X-rays. During an MRI, strong magnets and radio waves work together in a magnetic field to form detailed images. MRI images may provide more details about a medical condition than X-rays, CT scans, and ultrasounds can provide. For a standard MRI, you will lie on a platform that slides into a tunnel. The tunnel contains magnets that scan your body. If you have an open MRI, the tunnel will be open at the sides. In some cases, dye (contrast material or contrast dye) may be injected into your bloodstream to make the MRI images even clearer. Tell a health care provider about:  Any surgeries you have had.  Any medical conditions you have.  Any metal you may have in your body. The magnet used in MRI can cause metal objects in your body to move. Metal can also make it hard to get high-quality images. Objects that may contain metal include: ? Any joint replacement (prosthesis), such as an artificial knee or hip. ? An implanted defibrillator, pacemaker, or neurostimulator. ? A metallic ear implant (cochlear implant). ? An artificial heart valve. ? A metallic object in the eye. ? Metal splinters. ? Bullet fragments. ? A port for delivering insulin or chemotherapy.  Any tattoos. Some of the darker inks can cause problems with testing.  Whether you are pregnant, may be pregnant, or are breastfeeding.  Any fear of cramped spaces (claustrophobia). If this is a problem, it usually can be managed with medicines given prior to the MRI.  Any allergies you have.  All medicines you are taking, including vitamins, herbs, eye drops, creams, and over-the-counter medicines. What are the risks? Generally, this is a safe test. However, problems can occur:  If you have metal in your body, it may be affected by the magnet used during the test. If you have a metallic implant  close to the area being tested, it may be hard to get high-quality images.  If you are pregnant, you should avoid MRI tests during the first three months of pregnancy. MRI may have effects on an unborn baby.  If you are breastfeeding and contrast material will be used during your test, you may need to stop breastfeeding temporarily. Your breast milk may contain contrast material until the material naturally leaves your body. What happens before the procedure?  You will be asked to remove all metal, including: ? Your watch, jewelry, and other metal objects. ? Hearing aids. ? Dentures. ? Underwire bra. ? Makeup. Certain kinds of makeup contain small amounts of metal. ? Braces and fillings normally are not a problem.  If you are breastfeeding, ask your health care provider if you need to pump before your test and stop breastfeeding temporarily. You may need to do this if contrast material will be used. What happens during the procedure?   You may be given earplugs or headphones to listen to music. The MRI machine can be noisy.  You will lie down on a platform, similar to a long table.  If a contrast material will be used, an IV will be inserted into one of your veins. Contrast material will be injected into your IV at a certain time as images are taken.  The platform will slide into a tunnel that has magnets inside of it. When you are inside the tunnel, you will still be able to talk to your health care provider.  You will   be asked to lie very still while images are taken. Your health care provider will tell you when you can move. You may have to wait a few minutes to make sure that the images produced during the test are readable.  When all images are produced, the platform will slide out of the tunnel. The procedure may vary among health care providers and hospitals. What happens after the procedure?  You may be taken to a recovery area if sedation medicines were used. Your blood  pressure, heart rate, breathing rate, and blood oxygen level will be monitored until you leave the hospital or clinic.  If contrast material was used: ? It will leave your body through your urine within a day. You may be told to drink plenty of fluids to help flush the contrast material out of your system. ? If you are breastfeeding, do not breastfeed your child until your health care provider says that this is safe.  You may return to your normal activities right away, or as told by your health care provider.  It is up to you to get your test results. Ask your health care provider, or the department that is doing the test, when your results will be ready. Summary  Magnetic resonance imaging (MRI) is a painless test that produces detailed pictures of the inside of your body without using X-rays. Instead, strong magnets and radio waves work together in a magnetic field to form very detailed and sharp images.  Contrast material, also called contrast dye, may be injected into your body to make MRI images even clearer.  Before your MRI, be sure to tell your health care provider about any metal you may have in your body.  Talk with your health care provider about what your test results mean. This information is not intended to replace advice given to you by your health care provider. Make sure you discuss any questions you have with your health care provider. Document Revised: 12/12/2016 Document Reviewed: 12/12/2016 Elsevier Patient Education  2020 Elsevier Inc.  

## 2019-06-10 NOTE — Progress Notes (Signed)
Orders placed to access port the day of brain MRI at [place].   Mont Dutton R.T.(R)(T) Radiation Special Procedures Navigator

## 2019-06-12 ENCOUNTER — Encounter: Payer: Self-pay | Admitting: Radiation Oncology

## 2019-06-12 ENCOUNTER — Other Ambulatory Visit: Payer: Self-pay

## 2019-06-12 ENCOUNTER — Telehealth: Payer: Self-pay

## 2019-06-12 NOTE — Progress Notes (Signed)
 Patient Care Team: Badger, [name], MD as PCP - General (Family Medicine) Stuart, [name], RN as Oncology Nurse Navigator Martini, [name], RN as Oncology Nurse Navigator Serpe, [name], NP as Nurse Practitioner (Hospice and Palliative Medicine) Gudena, Vinay, MD as Consulting Physician (Hematology and Oncology) Causey, Lindsey Cornetto, NP as Nurse Practitioner (Hematology and Oncology)  DIAGNOSIS:    ICD-10-CM   1. Malignant neoplasm of lower-outer quadrant of left breast of female, estrogen receptor negative (HCC)  C50.512    Z17.1     SUMMARY OF ONCOLOGIC HISTORY: 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, estrogen receptor negative (HCC)  10/20/2016 Mammogram   Mammogram and ultrasound of the left breast revealed 1.7 cm mass at 4:00 position, 6:30 position 5 x 4 x 4 mm mass, 6:00 position 5 cm nipple 7 x 6 x 11 mm, left axillary lymph node with thickened cortex, T1c N1 stage II a AJCC 8   10/24/2016 Initial Diagnosis   Left breast biopsy 6:30 position 3 cm from nipple: IDC grade 2, DCIS, ER 0%, PR 0%, Ki-67 15% HER-2 positive ratio 2.1; 4:00 position 3 cm from nipple: IDC grade 2, DCIS, ER 0%, PR 0%, Ki-67 35%, HER-2 positive ratio 2.02; left axillary lymph node biopsy positive   11/04/2016 - 02/17/2017 Neo-Adjuvant Chemotherapy   TCH Perjeta 6 cycles followed by Herceptin + Perjeta maintenance to be completed September 2019   11/30/2016 Genetic Testing   Negative genetic testing on the common hereditary cancer panel.  The Hereditary Gene Panel offered by [facility] includes sequencing and/or deletion duplication testing of the following 47 genes: APC, ATM, AXIN2, BARD1, BMPR1A, BRCA1, BRCA2, BRIP1, CDH1, CDK4, CDKN2A (p14ARF), CDKN2A (p16INK4a), CHEK2, CTNNA1, DICER1, EPCAM (Deletion/duplication testing only), GREM1 (promoter region deletion/duplication testing only), KIT, MEN1, MLH1, MSH2, MSH3, MSH6, MUTYH, NBN, NF1, NHTL1, PALB2, PDGFRA,  PMS2, POLD1, POLE, PTEN, RAD50, RAD51C, RAD51D, SDHB, SDHC, SDHD, SMAD4, SMARCA4. STK11, TP53, TSC1, TSC2, and VHL.  The following genes were evaluated for sequence changes only: SDHA and HOXB13 c.251G>A variant only. The report date is November 30, 2016.    03/27/2017 Surgery   Bilateral mastectomies: Left mastectomy: IDC grade 2 0.9 cm, nodes negative, right mastectomy benign, ER 0%, PR 0%, HER-2 positive ratio 2.6   05/08/2017 - 06/09/2017 Radiation Therapy   Adjuvant radiation therapy   10/23/2017 Miscellaneous   Neratinib discontinued after 4 weeks for severe diarrhea   07/25/2018 Relapse/Recurrence   MRI of right elbow showed bone lesion consistent with malignancy. PET scan showed bilateral pulmonary nodules and several lytic bone lesions compatible with metastatic disease. Brain MRI on 08/02/18 showed no evidence of metastatic disease.   08/02/2018 PET scan   Bilateral hypermetabolic lung nodules, LUL 1.3 cm with SUV 3.88, lingular nodule 1.4 cm SUV 3.7, central lingular nodule 1.2 cm SUV 9.76, right middle lobe nodule 1.5 cm SUV 9.9, lytic bone metastases inferior pubic ramus, sacrum, T12, right 11th rib.   08/08/2018 Procedure   Lung biopsy: metastatic carcinoma, HER-2 negative (0), ER/PR negative.   08/10/2018 -  Radiation Therapy   Palliative radiatio to the right humerus along the medial condyle   08/24/2018 - 09/05/2018 Radiation Therapy   Palliative radiation to the right 11th rib and right elbow   09/26/2018 - 12/04/2018 Chemotherapy   Carboplatin atezolizumab at [facility] with Dr. Lisa Carey on [facility] 043 clinical trial stopped because of new T5 metastases (toxicities included myopathy required prednisone, immune mediated thyroiditis), right upper   extremity DVT on apixaban   12/14/2018 - 04/10/2019 Chemotherapy   The patient had pegfilgrastim-bmez (ZIEXTENZO) injection 6 mg, 6 mg, Subcutaneous,  Once, 2 of 2 cycles Administration: 6 mg (03/02/2019), 6 mg (02/16/2019), 6 mg (03/16/2019),  6 mg (03/29/2019) eriBULin mesylate (HALAVEN) 2.7 mg in sodium chloride 0.9 % 100 mL chemo infusion, 1.42 mg/m2 = 2.65 mg, Intravenous,  Once, 4 of 4 cycles Dose modification: 1.1 mg/m2 (original dose 1.4 mg/m2, Cycle 2, Reason: Dose not tolerated, Comment: neutropenic fever), 0.7 mg/m2 (original dose 1.4 mg/m2, Cycle 4, Reason: Provider Judgment), 0.7 mg/m2 (original dose 1.4 mg/m2, Cycle 4, Reason: Provider Judgment) Administration: 2.7 mg (12/14/2018), 2.7 mg (12/21/2018), 2 mg (01/03/2019), 2 mg (01/10/2019), 1.35 mg (02/15/2019), 1.35 mg (03/01/2019), 1.35 mg (03/15/2019), 1.35 mg (03/28/2019)  for chemotherapy treatment.    12/17/2018 -  Radiation Therapy   Palliative radiation to sternal, sacral & pelvic lesions and SRS for T3 and C7-T1 lesions.   04/11/2019 -  Chemotherapy   The patient had palonosetron (ALOXI) injection 0.25 mg, 0.25 mg, Intravenous,  Once, 4 of 4 cycles Administration: 0.25 mg (04/11/2019), 0.25 mg (04/17/2019), 0.25 mg (05/02/2019), 0.25 mg (05/09/2019), 0.25 mg (05/23/2019), 0.25 mg (05/29/2019) pegfilgrastim (NEULASTA) injection 6 mg, 6 mg, Subcutaneous, Once, 1 of 1 cycle Administration: 6 mg (04/18/2019) pegfilgrastim (NEULASTA ONPRO KIT) injection 6 mg, 6 mg, Subcutaneous, Once, 3 of 3 cycles Administration: 6 mg (05/29/2019), 6 mg (05/09/2019) fosaprepitant (EMEND) 150 mg in sodium chloride 0.9 % 145 mL IVPB, 150 mg, Intravenous,  Once, 4 of 4 cycles Administration: 150 mg (04/11/2019), 150 mg (04/17/2019), 150 mg (05/02/2019), 150 mg (05/09/2019), 150 mg (05/23/2019), 150 mg (05/29/2019) sacituzumab govitecan-hziy (TRODELVY) 590 mg in sodium chloride 0.9 % 250 mL (1.9094 mg/mL) chemo infusion, 8 mg/kg = 590 mg (100 % of original dose 8 mg/kg), Intravenous,  Once, 4 of 4 cycles Dose modification: 8 mg/kg (original dose 8 mg/kg, Cycle 1, Reason: Provider Judgment), 4 mg/kg (original dose 4 mg/kg, Cycle 2, Reason: Other (see comments), Comment: split bags), 3 mg/kg (original dose 4 mg/kg,  Cycle 2, Reason: Provider Judgment), 4 mg/kg (original dose 4 mg/kg, Cycle 2, Reason: Other (see comments), Comment: split bags) Administration: 590 mg (04/11/2019), 590 mg (04/17/2019), 590 mg (05/02/2019), 220 mg (05/09/2019), 220 mg (05/23/2019), 220 mg (05/29/2019), 220 mg (05/09/2019), 220 mg (05/23/2019), 220 mg (05/29/2019)  for chemotherapy treatment.    Bone metastases ([place])  08/07/2018 Initial Diagnosis   Bone metastases ([place])   12/14/2018 - 04/24/2019 Chemotherapy   The patient had pertuzumab (PERJETA) 420 mg in sodium chloride 0.9 % 250 mL chemo infusion, 420 mg (100 % of original dose 420 mg), Intravenous, Once, 5 of 6 cycles Dose modification: 420 mg (original dose 420 mg, Cycle 1, Reason: Provider Judgment) Administration: 420 mg (12/14/2018), 420 mg (01/10/2019), 420 mg (03/28/2019), 420 mg (01/30/2019), 420 mg (03/01/2019) trastuzumab-dkst (OGIVRI) 600 mg in sodium chloride 0.9 % 250 mL chemo infusion, 609 mg, Intravenous,  Once, 5 of 6 cycles Administration: 600 mg (12/14/2018), 450 mg (01/10/2019), 450 mg (03/28/2019), 450 mg (01/30/2019), 450 mg (03/01/2019)  for chemotherapy treatment.    12/17/2018 -  Radiation Therapy   Palliative radiation to sternal, sacral & pelvic lesions and SRS for T3 and C7-T1 lesions.     CHIEF COMPLIANT: Follow-up of metastatic breast cancer,cycle4day 1 Sacituzumab-Govitecan  INTERVAL HISTORY: Laura Anthony is a 44 y.o. with above-mentioned history of metastatic breast cancer who is currently ontreatment withSacituzumab-Govitecan. Brain MRI on 06/10/19 showed a new 0.5cm  enhancing lesion in the posterior left frontal lobe concerning for metastasis.She presents to the clinic todayfor treatment and to review her scan.  Her major problem is slight unsteadiness on her gait and she seems to sway to the right side.  She has fallen a couple of times with the big bruises as result of that.  She denies any sensory neuropathy but she does feel that her  hands and feet appear to be weaker than before.  She is otherwise tolerating her treatment extremely well.  ALLERGIES:  is allergic to sumatriptan; statins; and tape.  MEDICATIONS:  Current Outpatient Medications  Medication Sig Dispense Refill  . ELIQUIS 5 MG TABS tablet Take 1 tablet (5 mg total) by mouth 2 (two) times daily. 60 tablet 3  . gabapentin (NEURONTIN) 300 MG capsule Take 2 capsules (600 mg total) by mouth at bedtime. (Patient taking differently: Take 600 mg by mouth at bedtime. 3110m AM, afternoon, and 600 mg QHS.) 60 capsule 5  . hydrocortisone (CORTEF) 20 MG tablet     . levothyroxine (SYNTHROID) 88 MCG tablet TAKE 1 TABLET (88 MCG TOTAL) BY MOUTH DAILY BEFORE BREAKFAST. 90 tablet 0  . loperamide (IMODIUM A-D) 2 MG tablet Take 2 at diarrhea onset, then 1 every 2hr until 12hrs with no BM. May take 2 every 4hrs at night. If diarrhea recurs repeat. 100 tablet 1  . LORazepam (ATIVAN) 0.5 MG tablet Take 1 tablet (0.5 mg total) by mouth every 6 (six) hours as needed for anxiety. 45 tablet 1  . methadone (DOLOPHINE) 10 MG tablet Take 1 tablet (10 mg total) by mouth every 6 (six) hours as needed. 120 tablet 0  . naloxone (NARCAN) 4 MG/0.1ML LIQD nasal spray kit Spray once in one nostril prn overdose, may repeat x 1 1 each 1  . ondansetron (ZOFRAN) 8 MG tablet Take 1 tablet (8 mg total) by mouth 2 (two) times daily as needed. Start on the third day after chemotherapy. 30 tablet 1  . oxyCODONE-acetaminophen (PERCOCET) 10-325 MG tablet Take 1 tablet by mouth every 4 (four) hours as needed for pain. 120 tablet 0  . pantoprazole (PROTONIX) 40 MG tablet Take 1 tablet (40 mg total) by mouth 2 (two) times daily. 60 tablet 0  . prochlorperazine (COMPAZINE) 10 MG tablet Take 1 tablet (10 mg total) by mouth every 6 (six) hours as needed (Nausea or vomiting). 30 tablet 1  . sulfamethoxazole-trimethoprim (BACTRIM DS) 800-160 MG tablet Take 1 tablet by mouth 2 (two) times daily. 14 tablet 0  .  venlafaxine XR (EFFEXOR-XR) 75 MG 24 hr capsule TAKE 1 CAPSULE (75 MG TOTAL) BY MOUTH DAILY WITH BREAKFAST. 90 capsule 3  . zolpidem (AMBIEN CR) 12.5 MG CR tablet Take 1 tablet (12.5 mg total) by mouth at bedtime as needed for sleep. 30 tablet 3   No current facility-administered medications for this visit.   Facility-Administered Medications Ordered in Other Visits  Medication Dose Route Frequency Provider Last Rate Last Admin  . atropine injection 0.5 mg  0.5 mg Intravenous Once PRN GNicholas Lose MD      . dexamethasone (DECADRON) 10 mg in sodium chloride 0.9 % 50 mL IVPB  10 mg Intravenous Once GNicholas Lose MD 204 mL/hr at 06/13/19 1159 10 mg at 06/13/19 1159  . diphenhydrAMINE (BENADRYL) injection 25 mg  25 mg Intravenous Once GNicholas Lose MD      . famotidine (PEPCID) IVPB 20 mg premix  20 mg Intravenous Once GNicholas Lose MD      .  fosaprepitant (EMEND) 150 mg in sodium chloride 0.9 % 145 mL IVPB  150 mg Intravenous Once Nicholas Lose, MD      . heparin lock flush 100 unit/mL  500 Units Intracatheter Once PRN Nicholas Lose, MD      . palonosetron (ALOXI) injection 0.25 mg  0.25 mg Intravenous Once Nicholas Lose, MD      . sacituzumab govitecan-hziy (TRODELVY) 220 mg in sodium chloride 0.9 % 100 mL (1.8033 mg/mL) chemo infusion  3 mg/kg (Treatment Plan Recorded) Intravenous Once Nicholas Lose, MD      . sacituzumab govitecan-hziy (TRODELVY) 220 mg in sodium chloride 0.9 % 100 mL (1.8033 mg/mL) chemo infusion  3 mg/kg (Treatment Plan Recorded) Intravenous Once Nicholas Lose, MD      . sodium chloride flush (NS) 0.9 % injection 10 mL  10 mL Intracatheter PRN Nicholas Lose, MD        PHYSICAL EXAMINATION: ECOG PERFORMANCE STATUS: 1 - Symptomatic but completely ambulatory  Vitals:   06/13/19 1100  BP: (!) 130/91  Pulse: (!) 103  Resp: 17  Temp: 98.5 F (36.9 C)  SpO2: 96%   Filed Weights   06/13/19 1100  Weight: 169 lb 3.2 oz (76.7 kg)    LABORATORY DATA:  I have reviewed  the data as listed CMP Latest Ref Rng & Units 06/13/2019 05/29/2019 05/23/2019  Glucose 70 - 99 mg/dL 106(H) 91 109(H)  BUN 6 - 20 mg/dL _0 Creatinine 0.44 - 1.00 mg/dL 0.84 0.77 0.71  Sodium 135 - 145 mmol/L 139 137 140  Potassium 3.5 - 5.1 mmol/L 4.2 4.1 3.9  Chloride 98 - 111 mmol/L 105 102 107  CO2 22 - 32 mmol/L _1 Calcium 8.9 - 10.3 mg/dL 8.6(L) 8.9 9.1  Total Protein 6.5 - 8.1 g/dL 6.8 7.0 7.1  Total Bilirubin 0.3 - 1.2 mg/dL <0.2(L) 0.2(L) 0.2(L)  Alkaline Phos 38 - 126 U/L 143(H) 133(H) 190(H)  AST 15 - 41 U/L 18 23 37  ALT 0 - 44 U/L 20 52(H) 53(H)    Lab Results  Component Value Date   WBC 5.3 06/13/2019   HGB 10.1 (L) 06/13/2019   HCT 32.9 (L) 06/13/2019   MCV 108.6 (H) 06/13/2019   PLT 157 06/13/2019   NEUTROABS 3.8 06/13/2019    ASSESSMENT & PLAN:  Malignant neoplasm of lower-outer quadrant of left breast of female, estrogen receptor negative ([place]) 10/24/2016: Left breast biopsy 6:30 position 3 cm from nipple: IDC grade 2, DCIS, ER 0%, PR 0%, Ki-67 15% HER-2 positive ratio 2.1; 4:00 position 3 cm from nipple: IDC grade 2, DCIS, ER 0%, PR 0%, Ki-67 35%, HER-2 positive ratio 2.02 Lymph node biopsy positive  Treatment Summary: 1. Neoadjuvant chemotherapy with TCHPcompleted 02/17/2017 this would be followed by Herceptinand Perjetamaintenance for 1 yearcompleted September 2019 2.Bilateral mastectomies 03/28/2016:Bilateral mastectomies: Left mastectomy: IDC grade 2 0.9 cm, nodes negative, right mastectomy benign, ER 0%, PR 0%, HER-2 positive ratio 2.6 3.Adjuvant radiation4/08/2017 to 06/09/2017 4.Neratinib started 10/12/2017 discontinued due to diarrhea 5. Elbow fracture: Due to metastatic disease, palliative radiation therapy 6. 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  She has done much better since  we made the change 2.Fatigue 3.Thrombocytopenia: Platelet count is up to 157 4.Anemia: Stable monitoring closely.  Today's hemoglobin is 10.1  CT CAP 05/28/2019: Reduction in the size of the dominant pulmonary nodules.  (1.6 cm to 1.3 cm) another nodule 1.9 cm to 1.6 cm.  Stable lytic lesions right sternum, T12 vertebral body, left iliac bone, sacrum and right inferior pubic ramus.  Stable healing fracture right posterolateral eighth rib  06/10/2019: Brain MRI: New 5 mm focus of enhancement posterior left frontal lobe (brain MRI was done because she has had frequent falls) Radiation is recommended.  Pain regimen: On methadone and Percocets and nerve blocks with Dr. Maryjean Ka Osteonecrosis of jaw: Discontinued Xgeva.  Continue with Sacituzumab-Govitecan.     No orders of the defined types were placed in this encounter.  The patient has a good understanding of the overall plan. she agrees with it. she will call with any problems that may develop before the next visit here.  Total time spent: 30 mins including face to face time and time spent for planning, charting and coordination of care  Nicholas Lose, MD 06/13/2019  I, Cloyde Reams Dorshimer, am acting as scribe for Dr. Nicholas Lose.  I have reviewed the above documentation for accuracy and completeness, and I agree with the above.

## 2019-06-12 NOTE — Progress Notes (Signed)
Patient has follow up appointment on 06/13/19 reports she has had multiple falls some with injuries and other times has tripped and fell. Occasionally use walker when in severe back pain. Patient reports improvement to pain.

## 2019-06-12 NOTE — Telephone Encounter (Signed)
Appointment reminder for 06/13/2019 patient verbalized she understood this is a telephone encounter. Meaningful use complete.

## 2019-06-13 ENCOUNTER — Encounter: Payer: Self-pay | Admitting: *Deleted

## 2019-06-13 ENCOUNTER — Other Ambulatory Visit: Payer: Self-pay

## 2019-06-13 ENCOUNTER — Ambulatory Visit
Admission: RE | Admit: 2019-06-13 | Discharge: 2019-06-13 | Disposition: A | Discharge status: Home / Self Care | Payer: 59 | Source: Ambulatory Visit | Attending: Radiation Oncology | Admitting: Radiation Oncology

## 2019-06-13 ENCOUNTER — Inpatient Hospital Stay: Payer: 59

## 2019-06-13 ENCOUNTER — Other Ambulatory Visit: Payer: Self-pay | Admitting: *Deleted

## 2019-06-13 ENCOUNTER — Inpatient Hospital Stay (HOSPITAL_BASED_OUTPATIENT_CLINIC_OR_DEPARTMENT_OTHER): Payer: 59 | Admitting: Hematology and Oncology

## 2019-06-13 VITALS — BP 123/80 | HR 88

## 2019-06-13 DIAGNOSIS — C50919 Malignant neoplasm of unspecified site of unspecified female breast: Secondary | ICD-10-CM

## 2019-06-13 DIAGNOSIS — Z7189 Other specified counseling: Secondary | ICD-10-CM

## 2019-06-13 DIAGNOSIS — Z171 Estrogen receptor negative status [ER-]: Secondary | ICD-10-CM

## 2019-06-13 DIAGNOSIS — C7951 Secondary malignant neoplasm of bone: Secondary | ICD-10-CM

## 2019-06-13 DIAGNOSIS — G893 Neoplasm related pain (acute) (chronic): Secondary | ICD-10-CM

## 2019-06-13 DIAGNOSIS — Z95828 Presence of other vascular implants and grafts: Secondary | ICD-10-CM

## 2019-06-13 DIAGNOSIS — C50512 Malignant neoplasm of lower-outer quadrant of left female breast: Secondary | ICD-10-CM | POA: Diagnosis not present

## 2019-06-13 LAB — CMP (CANCER CENTER ONLY)
ALT: 20 U/L (ref 0–44)
AST: 18 U/L (ref 15–41)
Albumin: 3.1 g/dL — ABNORMAL LOW (ref 3.5–5.0)
Alkaline Phosphatase: 143 U/L — ABNORMAL HIGH (ref 38–126)
Anion gap: 10 (ref 5–15)
BUN: 11 mg/dL (ref 6–20)
CO2: 24 mmol/L (ref 22–32)
Calcium: 8.6 mg/dL — ABNORMAL LOW (ref 8.9–10.3)
Chloride: 105 mmol/L (ref 98–111)
Creatinine: 0.84 mg/dL (ref 0.44–1.00)
GFR, Est AFR Am: >60 mL/min (ref >60)
GFR, Estimated: >60 mL/min (ref >60)
Glucose, Bld: 106 mg/dL — ABNORMAL HIGH (ref 70–99)
Potassium: 4.2 mmol/L (ref 3.5–5.1)
Sodium: 139 mmol/L (ref 135–145)
Total Bilirubin: <0.2 mg/dL — ABNORMAL LOW (ref 0.3–1.2)
Total Protein: 6.8 g/dL (ref 6.5–8.1)

## 2019-06-13 LAB — CBC WITH DIFFERENTIAL (CANCER CENTER ONLY)
Abs Immature Granulocytes: 0.02 10*3/uL (ref 0.00–0.07)
Basophils Absolute: 0 10*3/uL (ref 0.0–0.1)
Basophils Relative: 1 %
Eosinophils Absolute: 0.1 10*3/uL (ref 0.0–0.5)
Eosinophils Relative: 2 %
HCT: 32.9 % — ABNORMAL LOW (ref 36.0–46.0)
Hemoglobin: 10.1 g/dL — ABNORMAL LOW (ref 12.0–15.0)
Immature Granulocytes: 0 %
Lymphocytes Relative: 18 %
Lymphs Abs: 1 10*3/uL (ref 0.7–4.0)
MCH: 33.3 pg (ref 26.0–34.0)
MCHC: 30.7 g/dL (ref 30.0–36.0)
MCV: 108.6 fL — ABNORMAL HIGH (ref 80.0–100.0)
Monocytes Absolute: 0.5 10*3/uL (ref 0.1–1.0)
Monocytes Relative: 9 %
Neutro Abs: 3.8 10*3/uL (ref 1.7–7.7)
Neutrophils Relative %: 70 %
Platelet Count: 157 10*3/uL (ref 150–400)
RBC: 3.03 MIL/uL — ABNORMAL LOW (ref 3.87–5.11)
RDW: 19.2 % — ABNORMAL HIGH (ref 11.5–15.5)
WBC Count: 5.3 10*3/uL (ref 4.0–10.5)
nRBC: 0 % (ref 0.0–0.2)

## 2019-06-13 LAB — MAGNESIUM: Magnesium: 2 mg/dL (ref 1.7–2.4)

## 2019-06-13 LAB — PHOSPHORUS: Phosphorus: 4 mg/dL (ref 2.5–4.6)

## 2019-06-13 MED ORDER — DIPHENHYDRAMINE HCL 50 MG/ML IJ SOLN
INTRAMUSCULAR | Status: AC
  Filled 2019-06-13: qty 1

## 2019-06-13 MED ORDER — ACETAMINOPHEN 325 MG PO TABS
ORAL_TABLET | ORAL | Status: AC
  Filled 2019-06-13: qty 2

## 2019-06-13 MED ORDER — ATROPINE SULFATE 1 MG/ML IJ SOLN: 0.5000 mg | Freq: Once | INTRAMUSCULAR | Status: DC | PRN

## 2019-06-13 MED ORDER — PALONOSETRON HCL INJECTION 0.25 MG/5ML
INTRAVENOUS | Status: AC
  Filled 2019-06-13: qty 5

## 2019-06-13 MED ORDER — ACETAMINOPHEN 325 MG PO TABS
650.0000 mg | ORAL_TABLET | Freq: Once | ORAL | Status: AC
  Administered 2019-06-13: 650 mg via ORAL

## 2019-06-13 MED ORDER — FAMOTIDINE IN NACL 20-0.9 MG/50ML-% IV SOLN
INTRAVENOUS | Status: AC
  Filled 2019-06-13: qty 50

## 2019-06-13 MED ORDER — FAMOTIDINE IN NACL 20-0.9 MG/50ML-% IV SOLN
20.0000 mg | Freq: Once | INTRAVENOUS | Status: AC
  Administered 2019-06-13: 20 mg via INTRAVENOUS

## 2019-06-13 MED ORDER — SODIUM CHLORIDE 0.9% FLUSH
10.0000 mL | INTRAVENOUS | Status: DC | PRN
  Administered 2019-06-13: 10 mL
  Filled 2019-06-13: qty 10

## 2019-06-13 MED ORDER — HEPARIN SOD (PORK) LOCK FLUSH 100 UNIT/ML IV SOLN
500.0000 [IU] | Freq: Once | INTRAVENOUS | Status: AC | PRN
  Administered 2019-06-13: 500 [IU]
  Filled 2019-06-13: qty 5

## 2019-06-13 MED ORDER — SODIUM CHLORIDE 0.9 % IV SOLN
Freq: Once | INTRAVENOUS | Status: AC
  Filled 2019-06-13: qty 250

## 2019-06-13 MED ORDER — SODIUM CHLORIDE 0.9% FLUSH
10.0000 mL | INTRAVENOUS | Status: DC | PRN
  Administered 2019-06-13: 10 mL via INTRAVENOUS
  Filled 2019-06-13: qty 10

## 2019-06-13 MED ORDER — PALONOSETRON HCL INJECTION 0.25 MG/5ML
0.2500 mg | Freq: Once | INTRAVENOUS | Status: AC
  Administered 2019-06-13: 0.25 mg via INTRAVENOUS

## 2019-06-13 MED ORDER — SODIUM CHLORIDE 0.9 % IV SOLN
3.0000 mg/kg | Freq: Once | INTRAVENOUS | Status: AC
  Administered 2019-06-13: 220 mg via INTRAVENOUS
  Filled 2019-06-13: qty 22

## 2019-06-13 MED ORDER — SODIUM CHLORIDE 0.9 % IV SOLN
150.0000 mg | Freq: Once | INTRAVENOUS | Status: AC
  Administered 2019-06-13: 150 mg via INTRAVENOUS
  Filled 2019-06-13: qty 150

## 2019-06-13 MED ORDER — SODIUM CHLORIDE 0.9 % IV SOLN
10.0000 mg | Freq: Once | INTRAVENOUS | Status: AC
  Administered 2019-06-13: 10 mg via INTRAVENOUS
  Filled 2019-06-13: qty 10

## 2019-06-13 MED ORDER — DIPHENHYDRAMINE HCL 50 MG/ML IJ SOLN
25.0000 mg | Freq: Once | INTRAMUSCULAR | Status: AC
  Administered 2019-06-13: 25 mg via INTRAVENOUS

## 2019-06-13 NOTE — Patient Instructions (Signed)
[facility] Discharge Instructions for Patients Receiving Chemotherapy  Today you received the following chemotherapy agents: Sacituzumab govitecan-hziy [name]) To help prevent nausea and vomiting after your treatment, we encourage you to take your nausea medication as prescribed.    If you develop nausea and vomiting that is not controlled by your nausea medication, call the clinic.   BELOW ARE SYMPTOMS THAT SHOULD BE REPORTED IMMEDIATELY:  *FEVER GREATER THAN 100.5 F  *CHILLS WITH OR WITHOUT FEVER  NAUSEA AND VOMITING THAT IS NOT CONTROLLED WITH YOUR NAUSEA MEDICATION  *UNUSUAL SHORTNESS OF BREATH  *UNUSUAL BRUISING OR BLEEDING  TENDERNESS IN MOUTH AND THROAT WITH OR WITHOUT PRESENCE OF ULCERS  *URINARY PROBLEMS  *BOWEL PROBLEMS  UNUSUAL RASH Items with * indicate a potential emergency and should be followed up as soon as possible.  Feel free to call the clinic should you have any questions or concerns. The clinic phone number is [phone].  Please show the [place] at check-in to the Emergency Department and triage nurse.

## 2019-06-13 NOTE — Progress Notes (Signed)
Radiation Oncology         [phone] ________________________________  Outpatient  ReConsultation - Conducted via telephone due to current COVID-19 concerns for limiting patient exposure  I spoke with the patient to conduct this consult visit via telephone to spare the patient unnecessary potential exposure in the healthcare setting during the current COVID-19 pandemic. The patient was notified in advance and was offered a [place] meeting to allow for face to face communication but unfortunately reported that they did not have the appropriate resources/technology to support such a visit and instead preferred to proceed with a telephone consult.    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  12/13/2018-01/07/2019:  The sternum, pelvis and sacrum were treated to 37.5 Gy in 15 fractions   12/19/19 SRS Treatment: The T1 vertebral body was treated with stereotactic radiosurgery [facility]) to 18 Gy in 1 fraction  08/22/2018-09/05/2018:  30 Gy in 10 fractions to the right 11th rib  08/08/2018-08/21/2018:  30 Gy in 10 fractions to the right elbow  05/08/2017 - 06/21/2017: The patient initially received a dose of 50.4 Gy in 28 fractions to the leftchest wall,supraclavicular region, and posterior axillary region. This was delivered using a 3-D conformal, 4 field technique. The patient then received a boost to the mastectomy scar. This delivered an additional 10 Gy in 5 fractions using an en face electron field. The total dose was 60.4 Gy.   Narrative:  Laura Anthony is a pleasant 44 y.o. woman well known to our service  with a history of metastatic breast cancer. She has bone, lung, and now brain disease that was recently noted. She completed her most recent therapy with radiotherapy in December 2020 to the left rib along T11-12. She has been on systemic therapy with Dr. Lindi Adie. She had been on [place] but was switched to [place] in  March 2021. Recent imaging showed some improvement in her systemic disease, however she presented to clinic with episodes of falls and imbalance and an MRI of the brain on 06/10/19 revealed a new 5 mm focus of enhancement concerning for metastatic disease in the posterior left frontal lobe without edema. Disease at C3 which has been seen in other imaging was noted but no additional findings were noted. She's contacted today to discuss consideration of stereotactic radiosurgery [facility]) to the new brain lesion. She is scheduled for a 3T MRI on 06/19/19.    On review of systems, the patient reports that she is doing well overall. She reports she continues to gain weight. She is not having headaches, visual or auditory disturbances or changes in behavior. She is however feeling like she is walking into things and has a drifting gait. She reports when this happens she may misstep and cannot correct this and then falls. She denies any recent changes with her medications or blood pressure being low. She does have multiple medications that have sedating properties including pain medication, depression/anxiety and sleep medications though that could be contributing.  She denies any chest pain, shortness of breath, cough, fevers, chills, night sweats, unintended weight changes. She denies any bowel or bladder disturbances, and denies abdominal pain, nausea or vomiting.  She denies any new musculoskeletal or joint aches or pains, new skin lesions or concerns. A complete review of systems is obtained and is otherwise negative.   PAST MEDICAL HISTORY:  Past Medical History:  Diagnosis Date   ADD (attention  deficit disorder)    Anemia    Anxiety    Breast cancer, left breast ([place])    S/P mastectomy 03/27/2017   DVT (deep venous thrombosis) ([place]) 2017   calf left - probably due to [facility] pills-took eliquis x3 mos, nonthing now   High cholesterol    Impingement syndrome of right shoulder 07/2013   Migraine    "usually 1/month" (03/28/2017)   PONV (postoperative nausea and vomiting)    Right bicipital tenosynovitis 07/2013   Rotator cuff impingement syndrome of right shoulder 07/12/2013   Seizures ([place])    x 1 as a child - was never on anticonvulsants (03/28/2017)    PAST SURGICAL HISTORY: Past Surgical History:  Procedure Laterality Date   ADENOIDECTOMY  1981   ANKLE ARTHROSCOPY Right    BREAST BIOPSY Left 10/2016   KNEE ARTHROSCOPY Right    KNEE ARTHROSCOPY W/ ACL RECONSTRUCTION Left    LIPOSUCTION WITH LIPOFILLING Bilateral 01/05/2018   Procedure: LIPOFILLING FROM ABDOMEN TO BILATERAL CHEST;  Surgeon: Irene Limbo, MD;  Location: [place];  Service: Plastics;  Laterality: Bilateral;   MASTECTOMY Left 03/27/2017   NIPPLE SPARING MASTECTOMY WITH RADIOACTIVE SEED TARGETED LYMPH NODE EXCISION AND LEFT AXILLARY SENTINEL LYMPH NODE BIOPSY   MASTECTOMY Right 03/27/2017   RIGHT PROPHYLACTIC NIPPLE SPARING MASTECTOMY   NIPPLE SPARING MASTECTOMY Right 03/27/2017   Procedure: RIGHT PROPHYLACTIC NIPPLE SPARING MASTECTOMY;  Surgeon: Rolm Bookbinder, MD;  Location: [place];  Service: General;  Laterality: Right;   PORT-A-CATH REMOVAL Right 01/05/2018   Procedure: REMOVAL RIGHT CHEST PORT;  Surgeon: Irene Limbo, MD;  Location: [place];  Service: Plastics;  Laterality: Right;   PORTACATH PLACEMENT N/A 11/01/2016   Procedure: INSERTION PORT-A-CATH WITH [place];  Surgeon: Rolm Bookbinder, MD;  Location: [place];  Service: General;  Laterality: N/A;   PORTACATH PLACEMENT N/A 08/23/2018   Procedure: INSERTION PORT-A-CATH WITH  ULTRASOUND;  Surgeon: Rolm Bookbinder, MD;  Location: [place];  Service: General;  Laterality: N/A;   RADIOACTIVE SEED GUIDED AXILLARY SENTINEL LYMPH NODE Left 03/27/2017   Procedure: LEFT NIPPLE SPARING MASTECTOMY WITH RADIOACTIVE SEED TARGETED LYMPH NODE EXCISION AND LEFT AXILLARY SENTINEL LYMPH NODE BIOPSY;  Surgeon: Rolm Bookbinder, MD;  Location: [place];  Service: General;  Laterality: Left;  REQUESTS RNFA   RECONSTRUCTION BREAST IMMEDIATE / DELAYED W/ TISSUE EXPANDER Bilateral 03/27/2017   BILATERAL BREAST RECONSTRUCTION WITH PLACEMENT OF TISSUE EXPANDER AND ALLODERM   REMOVAL OF BILATERAL TISSUE EXPANDERS WITH PLACEMENT OF BILATERAL BREAST IMPLANTS Bilateral 01/05/2018   Procedure: REMOVAL OF BILATERAL TISSUE EXPANDERS WITH PLACEMENT OF BILATERAL BREAST IMPLANTS;  Surgeon: Irene Limbo, MD;  Location: [place];  Service: Plastics;  Laterality: Bilateral;   SEPTOPLASTY WITH ETHMOIDECTOMY, AND MAXILLARY ANTROSTOMY  10/29/2010   bilat. max. antrostomy with left max. stripping; left ant. ethmoidectomy; right total ethmoidectomy; sphenoidotomy   SHOULDER ARTHROSCOPY WITH SUBACROMIAL DECOMPRESSION AND BICEP TENDON REPAIR Right 07/12/2013   Procedure: RIGHT SHOULDER ARTHROSCOPY DEBRIDEMENT EXTENSIVE DECOMPRESSION SUBACROMIAL PARTIAL ACROMIOPLASTY;  Surgeon: Johnny Bridge, MD;  Location: [place];  Service: Orthopedics;  Laterality: Right;   WRIST ARTHROSCOPY  01/17/2012   Procedure: ARTHROSCOPY WRIST; right wrist Surgeon: Tennis Must, MD;  Location:  [place];  Service: Orthopedics;  Laterality: Right;  RIGHT WRIST ARTHROSCOPY WITH TRIANGULAR FIBROCARTILAGE COMPLEX REPAIR AND DEBRIDEMENT     PAST SOCIAL HISTORY:  Social History   Socioeconomic History   Marital status: Married    Spouse name: Not on file   Number of children: Not on file   Years of education: Not on file   Highest  education level: Not on file  Occupational History   Not on file  Tobacco Use   Smoking status: Never Smoker   Smokeless tobacco: Never Used  Substance and Sexual Activity   Alcohol use: Yes    Comment: Drinks very rare   Drug use: No   Sexual activity: Yes    Birth control/protection: Surgical    Comment: husband has had a vasectomy  Other Topics Concern   Not on file  Social History Narrative   Not on file   Social Determinants of Health   Financial Resource Strain:    Difficulty of Paying Living Expenses:   Food Insecurity:    Worried About Charity fundraiser in the Last Year:    Arboriculturist in the Last Year:   Transportation Needs:    Film/video editor (Medical):    Lack of Transportation (Non-Medical):   Physical Activity:    Days of Exercise per Week:    Minutes of Exercise per Session:   Stress:    Feeling of Stress :   Social Connections:    Frequency of Communication with Friends and Family:    Frequency of Social Gatherings with Friends and Family:    Attends Religious Services:    Active Member of Clubs or Organizations:    Attends Music therapist:    Marital Status:   Intimate Partner Violence:    Fear of Current or Ex-Partner:    Emotionally Abused:    Physically Abused:    Sexually Abused:     PAST FAMILY HISTORY: Family History  Problem Relation Age of Onset   Breast cancer Mother 74       triple negative   Leukemia Father    Lung cancer Father    Heart attack Maternal Uncle    Prostate cancer Paternal Uncle    COPD Paternal Grandmother    Heart disease Paternal Grandfather    Prostate cancer Paternal Uncle    Leukemia Cousin     MEDICATIONS  Current Outpatient Medications  Medication Sig Dispense Refill   ELIQUIS 5 MG TABS tablet Take 1 tablet (5 mg total) by mouth 2 (two) times daily. 60 tablet 3   gabapentin (NEURONTIN) 300 MG capsule Take 2 capsules (600 mg total) by mouth  at bedtime. (Patient taking differently: Take 600 mg by mouth at bedtime. 310m AM, afternoon, and 600 mg QHS.) 60 capsule 5   hydrocortisone (CORTEF) 20 MG tablet      levothyroxine (SYNTHROID) 88 MCG tablet TAKE 1 TABLET (88 MCG TOTAL) BY MOUTH DAILY BEFORE BREAKFAST. 90 tablet 0   loperamide (IMODIUM A-D) 2 MG tablet Take 2 at diarrhea onset, then 1 every 2hr until 12hrs with no BM. May take 2 every 4hrs at night. If diarrhea recurs repeat. 100 tablet 1   LORazepam (ATIVAN) 0.5 MG tablet Take 1 tablet (0.5 mg total) by mouth every 6 (six) hours as needed for anxiety. 45 tablet 1   methadone (DOLOPHINE) 10 MG tablet Take 1 tablet (10 mg total) by mouth every 6 (six) hours as needed. 120 tablet  0   naloxone (NARCAN) 4 MG/0.1ML LIQD nasal spray kit Spray once in one nostril prn overdose, may repeat x 1 1 each 1   ondansetron (ZOFRAN) 8 MG tablet Take 1 tablet (8 mg total) by mouth 2 (two) times daily as needed. Start on the third day after chemotherapy. 30 tablet 1   oxyCODONE-acetaminophen (PERCOCET) 10-325 MG tablet Take 1 tablet by mouth every 4 (four) hours as needed for pain. 120 tablet 0   pantoprazole (PROTONIX) 40 MG tablet Take 1 tablet (40 mg total) by mouth 2 (two) times daily. 60 tablet 0   prochlorperazine (COMPAZINE) 10 MG tablet Take 1 tablet (10 mg total) by mouth every 6 (six) hours as needed (Nausea or vomiting). 30 tablet 1   sulfamethoxazole-trimethoprim (BACTRIM DS) 800-160 MG tablet Take 1 tablet by mouth 2 (two) times daily. 14 tablet 0   venlafaxine XR (EFFEXOR-XR) 75 MG 24 hr capsule TAKE 1 CAPSULE (75 MG TOTAL) BY MOUTH DAILY WITH BREAKFAST. 90 capsule 3   zolpidem (AMBIEN CR) 12.5 MG CR tablet Take 1 tablet (12.5 mg total) by mouth at bedtime as needed for sleep. 30 tablet 3   No current facility-administered medications for this encounter.    ALLERGIES:  Allergies  Allergen Reactions   Sumatriptan Other (See Comments)    Numbness to face    Statins  Other (See Comments)    Leg pain   Tape Rash and Other (See Comments)    Rash from dressing over port-a-cath    Physical Exam: Unable to assess due to encounter type.  Impression/Plan: 1. 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 Dr. [name] discusses the pathology findings and reviews the nature of metastatic disease to the brain. It is difficult to say when this lesion started growing given a normal MRI brain in July 2020. Her [name] does apparently have some blood brain barrier penetrance so hopefully this finding has been improved though we don't have interval imaging to prove this. She would be a candidate for stereotactic radiosurgery [facility]). We will proceed with 3T imaging to confirm that she remains a candidate for this. She is also aware of the possibility of finding small additional areas that could also be treated with this approach, but that if significantly different disease was noted, that this could change the style of therapy offered.  We discussed the risks, benefits, short, and long term effects of radiotherapy, and the patient is interested in proceeding. I discussed the delivery and logistics of radiotherapy and Dr. [name] anticipates a course of a single fraction with curative intent to the frontal lesion. I will coordinate simulation and treatment dates with our brain oncology navigator but anticipate proceeding with treatment in the near future. She will continue her systemic therapy as we proceed.  2. Falls and imbalance. It is difficult to identify the source of this, as her brain disease would not likely be contributing to these symptoms. We will follow this closely.  3. Contraception. The patient's husband has undergone vasectomy and the patient has previously and continues to decline a need for hcg testing.   Given  current concerns for patient exposure during the COVID-19 pandemic, this encounter was conducted via telephone.  The patient has provided two factor identification and has given verbal consent for this type of encounter and has been advised to only accept a meeting of this type  in a secure network environment. The time spent during this encounter was 35 minutes including preparation, discussion, and coordination of the patient's care. The attendants for this meeting include Hayden Pedro  and Christella App and her friend April.  During the encounter,  Hayden Pedro was located at [facility] Radiation Oncology Department.  Luella Gardenhire Fox was located in the car on the way to an appointment with her friend April.    Carola Rhine, PAC

## 2019-06-13 NOTE — Assessment & Plan Note (Signed)
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 Neoadjuvant chemotherapy with TCHPcompleted 02/17/2017 this would be followed by Herceptinand Perjetamaintenance for 1 yearcompleted September 2019 2.Bilateral mastectomies 03/28/2016:Bilateral mastectomies: Left mastectomy: IDC grade 2 0.9 cm, nodes negative, right mastectomy benign, ER 0%, PR 0%, HER-2 positive ratio 2.6 3.Adjuvant radiation4/08/2017 to 06/09/2017 4.Neratinib started 10/12/2017 discontinued due to diarrhea 5. Elbow fracture: Due to metastatic disease, palliative radiation therapy 6. Carboplatin atezolizumab at [facility] on a clinical trial [facility] 043 stopped for progression 12/04/2018 ---------------------------------------------------------------------------------------------------------------- Lung nodule biopsy: Metastatic breast cancer triple negative Current treatment: Sacituzumab-Govitecan days 1 and 8 every 3 weeks with Neulasta on day 9 started 04/11/2019, today is cycle 4  Toxicities: 1.Hoarseness of voice: Infusion reaction: We will proceed to give the treatment over 4 hours.  She has done much better since we made the change 2.Fatigue 3.Thrombocytopenia: Platelet count is up to 153 4.Anemia: Stable monitoring closely.  Today's hemoglobin is 10.6  CT CAP 05/28/2019: Reduction in the size of the dominant pulmonary nodules.  (1.6 cm to 1.3 cm) another nodule 1.9 cm to 1.6 cm.  Stable lytic lesions right sternum, T12 vertebral body, left iliac bone, sacrum and right inferior pubic ramus.  Stable healing fracture right posterolateral eighth rib  06/10/2019: Brain MRI: New 5 mm focus of enhancement posterior left frontal lobe (brain MRI was done because she has had frequent  falls) Radiation is recommended.  Pain regimen: On methadone and Percocets and nerve blocks with Dr. Maryjean Ka Osteonecrosis of jaw: Discontinued Xgeva.  Continue with Sacituzumab-Govitecan.

## 2019-06-13 NOTE — Addendum Note (Signed)
Addended by: Nicholas Lose on: 06/13/2019 12:15 PM   Modules accepted: Orders

## 2019-06-14 ENCOUNTER — Other Ambulatory Visit: Payer: Self-pay | Admitting: *Deleted

## 2019-06-14 NOTE — Progress Notes (Signed)
Pharmacist Chemotherapy Monitoring - Follow Up Assessment    I verify that I have reviewed each item in the below checklist:  . Regimen for the patient is scheduled for the appropriate day and plan matches scheduled date. Marland Kitchen Appropriate non-routine labs are ordered dependent on drug ordered. . If applicable, additional medications reviewed and ordered per protocol based on lifetime cumulative doses and/or treatment regimen.   Plan for follow-up and/or issues identified: No . I-vent associated with next due treatment: No . MD and/or nursing notified: No  Laura Anthony [facility] 06/14/2019 11:53 AM

## 2019-06-19 ENCOUNTER — Inpatient Hospital Stay: Admission: RE | Admit: 2019-06-19 | Payer: 59 | Source: Ambulatory Visit

## 2019-06-19 NOTE — Progress Notes (Signed)
 Patient Care Team: Badger, [name], MD as PCP - General (Family Medicine) Stuart, [name], RN as Oncology Nurse Navigator Martini, [name], RN as Oncology Nurse Navigator Serpe, [name], NP as Nurse Practitioner (Hospice and Palliative Medicine) Ching Rabideau, MD as Consulting Physician (Hematology and Oncology) Causey, Lindsey Cornetto, NP as Nurse Practitioner (Hematology and Oncology)  DIAGNOSIS:    ICD-10-CM   1. Malignant neoplasm of lower-outer quadrant of left breast of female, estrogen receptor negative (HCC)  C50.512    Z17.1     SUMMARY OF ONCOLOGIC HISTORY: 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, estrogen receptor negative (HCC)  10/20/2016 Mammogram   Mammogram and ultrasound of the left breast revealed 1.7 cm mass at 4:00 position, 6:30 position 5 x 4 x 4 mm mass, 6:00 position 5 cm nipple 7 x 6 x 11 mm, left axillary lymph node with thickened cortex, T1c N1 stage II a AJCC 8   10/24/2016 Initial Diagnosis   Left breast biopsy 6:30 position 3 cm from nipple: IDC grade 2, DCIS, ER 0%, PR 0%, Ki-67 15% HER-2 positive ratio 2.1; 4:00 position 3 cm from nipple: IDC grade 2, DCIS, ER 0%, PR 0%, Ki-67 35%, HER-2 positive ratio 2.02; left axillary lymph node biopsy positive   11/04/2016 - 02/17/2017 Neo-Adjuvant Chemotherapy   TCH Perjeta 6 cycles followed by Herceptin + Perjeta maintenance to be completed September 2019   11/30/2016 Genetic Testing   Negative genetic testing on the common hereditary cancer panel.  The Hereditary Gene Panel offered by [facility] includes sequencing and/or deletion duplication testing of the following 47 genes: APC, ATM, AXIN2, BARD1, BMPR1A, BRCA1, BRCA2, BRIP1, CDH1, CDK4, CDKN2A (p14ARF), CDKN2A (p16INK4a), CHEK2, CTNNA1, DICER1, EPCAM (Deletion/duplication testing only), GREM1 (promoter region deletion/duplication testing only), KIT, MEN1, MLH1, MSH2, MSH3, MSH6, MUTYH, NBN, NF1, NHTL1, PALB2, PDGFRA,  PMS2, POLD1, POLE, PTEN, RAD50, RAD51C, RAD51D, SDHB, SDHC, SDHD, SMAD4, SMARCA4. STK11, TP53, TSC1, TSC2, and VHL.  The following genes were evaluated for sequence changes only: SDHA and HOXB13 c.251G>A variant only. The report date is November 30, 2016.    03/27/2017 Surgery   Bilateral mastectomies: Left mastectomy: IDC grade 2 0.9 cm, nodes negative, right mastectomy benign, ER 0%, PR 0%, HER-2 positive ratio 2.6   05/08/2017 - 06/09/2017 Radiation Therapy   Adjuvant radiation therapy   10/23/2017 Miscellaneous   Neratinib discontinued after 4 weeks for severe diarrhea   07/25/2018 Relapse/Recurrence   MRI of right elbow showed bone lesion consistent with malignancy. PET scan showed bilateral pulmonary nodules and several lytic bone lesions compatible with metastatic disease. Brain MRI on 08/02/18 showed no evidence of metastatic disease.   08/02/2018 PET scan   Bilateral hypermetabolic lung nodules, LUL 1.3 cm with SUV 3.88, lingular nodule 1.4 cm SUV 3.7, central lingular nodule 1.2 cm SUV 9.76, right middle lobe nodule 1.5 cm SUV 9.9, lytic bone metastases inferior pubic ramus, sacrum, T12, right 11th rib.   08/08/2018 Procedure   Lung biopsy: metastatic carcinoma, HER-2 negative (0), ER/PR negative.   08/10/2018 -  Radiation Therapy   Palliative radiatio to the right humerus along the medial condyle   08/24/2018 - 09/05/2018 Radiation Therapy   Palliative radiation to the right 11th rib and right elbow   09/26/2018 - 12/04/2018 Chemotherapy   Carboplatin atezolizumab at [facility] with Dr. Lisa Carey on [facility] 043 clinical trial stopped because of new T5 metastases (toxicities included myopathy required prednisone, immune mediated thyroiditis), right upper   extremity DVT on apixaban   12/14/2018 - 04/10/2019 Chemotherapy   The patient had pegfilgrastim-bmez (ZIEXTENZO) injection 6 mg, 6 mg, Subcutaneous,  Once, 2 of 2 cycles Administration: 6 mg (03/02/2019), 6 mg (02/16/2019), 6 mg (03/16/2019),  6 mg (03/29/2019) eriBULin mesylate (HALAVEN) 2.7 mg in sodium chloride 0.9 % 100 mL chemo infusion, 1.42 mg/m2 = 2.65 mg, Intravenous,  Once, 4 of 4 cycles Dose modification: 1.1 mg/m2 (original dose 1.4 mg/m2, Cycle 2, Reason: Dose not tolerated, Comment: neutropenic fever), 0.7 mg/m2 (original dose 1.4 mg/m2, Cycle 4, Reason: Provider Judgment), 0.7 mg/m2 (original dose 1.4 mg/m2, Cycle 4, Reason: Provider Judgment) Administration: 2.7 mg (12/14/2018), 2.7 mg (12/21/2018), 2 mg (01/03/2019), 2 mg (01/10/2019), 1.35 mg (02/15/2019), 1.35 mg (03/01/2019), 1.35 mg (03/15/2019), 1.35 mg (03/28/2019)  for chemotherapy treatment.    12/17/2018 -  Radiation Therapy   Palliative radiation to sternal, sacral & pelvic lesions and SRS for T3 and C7-T1 lesions.   04/11/2019 -  Chemotherapy   The patient had palonosetron (ALOXI) injection 0.25 mg, 0.25 mg, Intravenous,  Once, 4 of 4 cycles Administration: 0.25 mg (04/11/2019), 0.25 mg (04/17/2019), 0.25 mg (05/02/2019), 0.25 mg (05/09/2019), 0.25 mg (05/23/2019), 0.25 mg (05/29/2019), 0.25 mg (06/13/2019) pegfilgrastim (NEULASTA) injection 6 mg, 6 mg, Subcutaneous, Once, 1 of 1 cycle Administration: 6 mg (04/18/2019) pegfilgrastim (NEULASTA ONPRO KIT) injection 6 mg, 6 mg, Subcutaneous, Once, 3 of 3 cycles Administration: 6 mg (05/29/2019), 6 mg (05/09/2019) fosaprepitant (EMEND) 150 mg in sodium chloride 0.9 % 145 mL IVPB, 150 mg, Intravenous,  Once, 4 of 4 cycles Administration: 150 mg (04/11/2019), 150 mg (04/17/2019), 150 mg (05/02/2019), 150 mg (05/09/2019), 150 mg (05/23/2019), 150 mg (05/29/2019), 150 mg (06/13/2019) sacituzumab govitecan-hziy (TRODELVY) 590 mg in sodium chloride 0.9 % 250 mL (1.9094 mg/mL) chemo infusion, 8 mg/kg = 590 mg (100 % of original dose 8 mg/kg), Intravenous,  Once, 4 of 4 cycles Dose modification: 8 mg/kg (original dose 8 mg/kg, Cycle 1, Reason: Provider Judgment), 4 mg/kg (original dose 4 mg/kg, Cycle 2, Reason: Other (see comments), Comment: split  bags), 3 mg/kg (original dose 4 mg/kg, Cycle 2, Reason: Provider Judgment), 4 mg/kg (original dose 4 mg/kg, Cycle 2, Reason: Other (see comments), Comment: split bags) Administration: 590 mg (04/11/2019), 590 mg (04/17/2019), 590 mg (05/02/2019), 220 mg (05/09/2019), 220 mg (05/23/2019), 220 mg (05/29/2019), 220 mg (06/13/2019), 220 mg (05/09/2019), 220 mg (05/23/2019), 220 mg (05/29/2019), 220 mg (06/13/2019)  for chemotherapy treatment.    Bone metastases ([place])  08/07/2018 Initial Diagnosis   Bone metastases ([place])   12/14/2018 - 04/24/2019 Chemotherapy   The patient had pertuzumab (PERJETA) 420 mg in sodium chloride 0.9 % 250 mL chemo infusion, 420 mg (100 % of original dose 420 mg), Intravenous, Once, 5 of 6 cycles Dose modification: 420 mg (original dose 420 mg, Cycle 1, Reason: Provider Judgment) Administration: 420 mg (12/14/2018), 420 mg (01/10/2019), 420 mg (03/28/2019), 420 mg (01/30/2019), 420 mg (03/01/2019) trastuzumab-dkst (OGIVRI) 600 mg in sodium chloride 0.9 % 250 mL chemo infusion, 609 mg, Intravenous,  Once, 5 of 6 cycles Administration: 600 mg (12/14/2018), 450 mg (01/10/2019), 450 mg (03/28/2019), 450 mg (01/30/2019), 450 mg (03/01/2019)  for chemotherapy treatment.    12/17/2018 -  Radiation Therapy   Palliative radiation to sternal, sacral & pelvic lesions and SRS for T3 and C7-T1 lesions.     CHIEF COMPLIANT: Follow-up of metastatic breast cancer,cycle4day 8 Sacituzumab-Govitecan  INTERVAL HISTORY: Laura Anthony is a 44 y.o. with above-mentioned history of metastatic breast cancer who  is currently ontreatment withSacituzumab-Govitecan.She presents to the clinic todayfor treatment. She continues to have instability in the gait.  The brain MRI showed a small lesion in the frontal area of the brain for which she is planning to undergo MRI of the brain and stereotactic radiation.  Her pain is under reasonable control with the current pain regimen.  Denies any nausea or  vomiting.  ALLERGIES:  is allergic to statins; sumatriptan; and tape.  MEDICATIONS:  Current Outpatient Medications  Medication Sig Dispense Refill  . ELIQUIS 5 MG TABS tablet Take 1 tablet (5 mg total) by mouth 2 (two) times daily. 60 tablet 3  . gabapentin (NEURONTIN) 300 MG capsule Take 2 capsules (600 mg total) by mouth at bedtime. (Patient taking differently: Take 600 mg by mouth at bedtime. '300mg'$  AM, afternoon, and 600 mg QHS.) 60 capsule 5  . levothyroxine (SYNTHROID) 88 MCG tablet TAKE 1 TABLET (88 MCG TOTAL) BY MOUTH DAILY BEFORE BREAKFAST. 90 tablet 0  . loperamide (IMODIUM A-D) 2 MG tablet Take 2 at diarrhea onset, then 1 every 2hr until 12hrs with no BM. May take 2 every 4hrs at night. If diarrhea recurs repeat. 100 tablet 1  . LORazepam (ATIVAN) 0.5 MG tablet Take 1 tablet (0.5 mg total) by mouth every 6 (six) hours as needed for anxiety. 45 tablet 1  . methadone (DOLOPHINE) 10 MG tablet Take 1 tablet (10 mg total) by mouth every 6 (six) hours as needed. 120 tablet 0  . naloxone (NARCAN) 4 MG/0.1ML LIQD nasal spray kit Spray once in one nostril prn overdose, may repeat x 1 1 each 1  . ondansetron (ZOFRAN) 8 MG tablet Take 1 tablet (8 mg total) by mouth 2 (two) times daily as needed. Start on the third day after chemotherapy. 30 tablet 1  . oxyCODONE-acetaminophen (PERCOCET) 10-325 MG tablet Take 1 tablet by mouth every 4 (four) hours as needed for pain. 120 tablet 0  . pantoprazole (PROTONIX) 40 MG tablet Take 1 tablet (40 mg total) by mouth 2 (two) times daily. 60 tablet 0  . prochlorperazine (COMPAZINE) 10 MG tablet Take 1 tablet (10 mg total) by mouth every 6 (six) hours as needed (Nausea or vomiting). 30 tablet 1  . sulfamethoxazole-trimethoprim (BACTRIM DS) 800-160 MG tablet Take 1 tablet by mouth 2 (two) times daily. 14 tablet 0  . venlafaxine XR (EFFEXOR-XR) 75 MG 24 hr capsule TAKE 1 CAPSULE (75 MG TOTAL) BY MOUTH DAILY WITH BREAKFAST. 90 capsule 3  . zolpidem (AMBIEN CR)  12.5 MG CR tablet Take 1 tablet (12.5 mg total) by mouth at bedtime as needed for sleep. 30 tablet 3   No current facility-administered medications for this visit.   Facility-Administered Medications Ordered in Other Visits  Medication Dose Route Frequency Provider Last Rate Last Admin  . sodium chloride flush (NS) 0.9 % injection 10 mL  10 mL Intracatheter PRN Nicholas Lose, MD   10 mL at 06/20/19 0814    PHYSICAL EXAMINATION: ECOG PERFORMANCE STATUS: 1 - Symptomatic but completely ambulatory  There were no vitals filed for this visit. There were no vitals filed for this visit.  LABORATORY DATA:  I have reviewed the data as listed CMP Latest Ref Rng & Units 06/13/2019 05/29/2019 05/23/2019  Glucose 70 - 99 mg/dL 106(H) 91 109(H)  BUN 6 - 20 mg/dL '11 15 14  '$ Creatinine 0.44 - 1.00 mg/dL 0.84 0.77 0.71  Sodium 135 - 145 mmol/L 139 137 140  Potassium 3.5 - 5.1 mmol/L 4.2 4.1 3.9  Chloride 98 - 111 mmol/L 105 102 107  CO2 22 - 32 mmol/L '24 28 24  '$ Calcium 8.9 - 10.3 mg/dL 8.6(L) 8.9 9.1  Total Protein 6.5 - 8.1 g/dL 6.8 7.0 7.1  Total Bilirubin 0.3 - 1.2 mg/dL <0.2(L) 0.2(L) 0.2(L)  Alkaline Phos 38 - 126 U/L 143(H) 133(H) 190(H)  AST 15 - 41 U/L 18 23 37  ALT 0 - 44 U/L 20 52(H) 53(H)    Lab Results  Component Value Date   WBC 4.9 06/20/2019   HGB 10.4 (L) 06/20/2019   HCT 32.5 (L) 06/20/2019   MCV 105.2 (H) 06/20/2019   PLT 131 (L) 06/20/2019   NEUTROABS 3.2 06/20/2019    ASSESSMENT & PLAN:  Malignant neoplasm of lower-outer quadrant of left breast of female, estrogen receptor negative ([place]) 10/24/2016: Left breast biopsy 6:30 position 3 cm from nipple: IDC grade 2, DCIS, ER 0%, PR 0%, Ki-67 15% HER-2 positive ratio 2.1; 4:00 position 3 cm from nipple: IDC grade 2, DCIS, ER 0%, PR 0%, Ki-67 35%, HER-2 positive ratio 2.02 Lymph node biopsy positive  Treatment Summary: 1. Neoadjuvant chemotherapy with TCHPcompleted 02/17/2017 this would be followed by Herceptinand  Perjetamaintenance for 1 yearcompleted September 2019 2.Bilateral mastectomies 03/28/2016:Bilateral mastectomies: Left mastectomy: IDC grade 2 0.9 cm, nodes negative, right mastectomy benign, ER 0%, PR 0%, HER-2 positive ratio 2.6 3.Adjuvant radiation4/08/2017 to 06/09/2017 4.Neratinib started 10/12/2017 discontinued due to diarrhea 5. Elbow fracture: Due to metastatic disease, palliative radiation therapy 6. 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 (1.6 cm to 1.3 cm) another nodule 1.9 cm to 1.6 cm. Stable lytic lesions right sternum, T12 vertebral body, left iliac bone, sacrum and right inferior pubic ramus. Stable healing fracture right posterolateral eighth rib  06/10/2019: Brain MRI: New 5 mm focus of enhancement posterior left frontal lobe (brain MRI was done because she has had frequent falls) Stereotactic radiosurgery is being planned  Pain regimen: On methadone and Percocets and nerve blocks with Dr. Maryjean Ka Osteonecrosis of jaw: Discontinued Xgeva.  Continue with Sacituzumab-Govitecan.    No orders of the defined types were placed in this encounter.  The patient has a good understanding of the overall plan. she agrees with it. she will call with  any problems that may develop before the next visit here.  Total time spent: 30 mins including face to face time and time spent for planning, charting and coordination of care  Nicholas Lose, MD 06/20/2019  I, Cloyde Reams Dorshimer, am acting as scribe for Dr. Nicholas Lose.  I have reviewed the above documentation for accuracy and completeness, and I agree with the above.

## 2019-06-20 ENCOUNTER — Ambulatory Visit: Payer: 59 | Admitting: Hematology and Oncology

## 2019-06-20 ENCOUNTER — Other Ambulatory Visit: Payer: 59

## 2019-06-20 ENCOUNTER — Ambulatory Visit: Payer: 59

## 2019-06-20 ENCOUNTER — Ambulatory Visit: Payer: 59 | Admitting: Radiation Oncology

## 2019-06-20 ENCOUNTER — Inpatient Hospital Stay (HOSPITAL_BASED_OUTPATIENT_CLINIC_OR_DEPARTMENT_OTHER): Payer: 59 | Admitting: Hematology and Oncology

## 2019-06-20 ENCOUNTER — Inpatient Hospital Stay: Payer: 59

## 2019-06-20 ENCOUNTER — Inpatient Hospital Stay: Payer: 59 | Admitting: Hematology and Oncology

## 2019-06-20 ENCOUNTER — Other Ambulatory Visit: Payer: Self-pay

## 2019-06-20 ENCOUNTER — Encounter: Payer: Self-pay | Admitting: *Deleted

## 2019-06-20 ENCOUNTER — Inpatient Hospital Stay: Admission: RE | Admit: 2019-06-20 | Payer: 59 | Source: Ambulatory Visit

## 2019-06-20 DIAGNOSIS — C50512 Malignant neoplasm of lower-outer quadrant of left female breast: Secondary | ICD-10-CM | POA: Diagnosis not present

## 2019-06-20 DIAGNOSIS — Z171 Estrogen receptor negative status [ER-]: Secondary | ICD-10-CM

## 2019-06-20 DIAGNOSIS — C7951 Secondary malignant neoplasm of bone: Secondary | ICD-10-CM

## 2019-06-20 DIAGNOSIS — Z7189 Other specified counseling: Secondary | ICD-10-CM

## 2019-06-20 DIAGNOSIS — C50919 Malignant neoplasm of unspecified site of unspecified female breast: Secondary | ICD-10-CM

## 2019-06-20 DIAGNOSIS — Z95828 Presence of other vascular implants and grafts: Secondary | ICD-10-CM

## 2019-06-20 DIAGNOSIS — G893 Neoplasm related pain (acute) (chronic): Secondary | ICD-10-CM

## 2019-06-20 LAB — PHOSPHORUS: Phosphorus: 3.3 mg/dL (ref 2.5–4.6)

## 2019-06-20 LAB — CBC WITH DIFFERENTIAL (CANCER CENTER ONLY)
Abs Immature Granulocytes: 0.09 10*3/uL — ABNORMAL HIGH (ref 0.00–0.07)
Basophils Absolute: 0 10*3/uL (ref 0.0–0.1)
Basophils Relative: 1 %
Eosinophils Absolute: 0 10*3/uL (ref 0.0–0.5)
Eosinophils Relative: 1 %
HCT: 32.5 % — ABNORMAL LOW (ref 36.0–46.0)
Hemoglobin: 10.4 g/dL — ABNORMAL LOW (ref 12.0–15.0)
Immature Granulocytes: 2 %
Lymphocytes Relative: 22 %
Lymphs Abs: 1.1 10*3/uL (ref 0.7–4.0)
MCH: 33.7 pg (ref 26.0–34.0)
MCHC: 32 g/dL (ref 30.0–36.0)
MCV: 105.2 fL — ABNORMAL HIGH (ref 80.0–100.0)
Monocytes Absolute: 0.5 10*3/uL (ref 0.1–1.0)
Monocytes Relative: 10 %
Neutro Abs: 3.2 10*3/uL (ref 1.7–7.7)
Neutrophils Relative %: 64 %
Platelet Count: 131 10*3/uL — ABNORMAL LOW (ref 150–400)
RBC: 3.09 MIL/uL — ABNORMAL LOW (ref 3.87–5.11)
RDW: 17.9 % — ABNORMAL HIGH (ref 11.5–15.5)
WBC Count: 4.9 10*3/uL (ref 4.0–10.5)
nRBC: 0 % (ref 0.0–0.2)

## 2019-06-20 LAB — CMP (CANCER CENTER ONLY)
ALT: 28 U/L (ref 0–44)
AST: 20 U/L (ref 15–41)
Albumin: 3.3 g/dL — ABNORMAL LOW (ref 3.5–5.0)
Alkaline Phosphatase: 114 U/L (ref 38–126)
Anion gap: 10 (ref 5–15)
BUN: 16 mg/dL (ref 6–20)
CO2: 26 mmol/L (ref 22–32)
Calcium: 9 mg/dL (ref 8.9–10.3)
Chloride: 104 mmol/L (ref 98–111)
Creatinine: 0.78 mg/dL (ref 0.44–1.00)
GFR, Est AFR Am: >60 mL/min (ref >60)
GFR, Estimated: >60 mL/min (ref >60)
Glucose, Bld: 118 mg/dL — ABNORMAL HIGH (ref 70–99)
Potassium: 3.5 mmol/L (ref 3.5–5.1)
Sodium: 140 mmol/L (ref 135–145)
Total Bilirubin: <0.2 mg/dL — ABNORMAL LOW (ref 0.3–1.2)
Total Protein: 7 g/dL (ref 6.5–8.1)

## 2019-06-20 LAB — MAGNESIUM: Magnesium: 1.9 mg/dL (ref 1.7–2.4)

## 2019-06-20 MED ORDER — PALONOSETRON HCL INJECTION 0.25 MG/5ML
INTRAVENOUS | Status: AC
  Filled 2019-06-20: qty 5

## 2019-06-20 MED ORDER — PALONOSETRON HCL INJECTION 0.25 MG/5ML
0.2500 mg | Freq: Once | INTRAVENOUS | Status: AC
  Administered 2019-06-20: 0.25 mg via INTRAVENOUS

## 2019-06-20 MED ORDER — SODIUM CHLORIDE 0.9 % IV SOLN
150.0000 mg | Freq: Once | INTRAVENOUS | Status: AC
  Administered 2019-06-20: 150 mg via INTRAVENOUS
  Filled 2019-06-20: qty 150

## 2019-06-20 MED ORDER — FAMOTIDINE IN NACL 20-0.9 MG/50ML-% IV SOLN
INTRAVENOUS | Status: AC
  Filled 2019-06-20: qty 50

## 2019-06-20 MED ORDER — SODIUM CHLORIDE 0.9 % IV SOLN
10.0000 mg | Freq: Once | INTRAVENOUS | Status: AC
  Administered 2019-06-20: 10 mg via INTRAVENOUS
  Filled 2019-06-20: qty 10

## 2019-06-20 MED ORDER — HEPARIN SOD (PORK) LOCK FLUSH 100 UNIT/ML IV SOLN
500.0000 [IU] | Freq: Once | INTRAVENOUS | Status: AC | PRN
  Administered 2019-06-20: 500 [IU]
  Filled 2019-06-20: qty 5

## 2019-06-20 MED ORDER — SODIUM CHLORIDE 0.9 % IV SOLN
3.0000 mg/kg | Freq: Once | INTRAVENOUS | Status: AC
  Administered 2019-06-20: 220 mg via INTRAVENOUS
  Filled 2019-06-20: qty 22

## 2019-06-20 MED ORDER — FAMOTIDINE IN NACL 20-0.9 MG/50ML-% IV SOLN
20.0000 mg | Freq: Once | INTRAVENOUS | Status: AC
  Administered 2019-06-20: 20 mg via INTRAVENOUS

## 2019-06-20 MED ORDER — PEGFILGRASTIM 6 MG/0.6ML ~~LOC~~ PSKT
PREFILLED_SYRINGE | SUBCUTANEOUS | Status: AC
  Filled 2019-06-20: qty 0.6

## 2019-06-20 MED ORDER — ACETAMINOPHEN 325 MG PO TABS
650.0000 mg | ORAL_TABLET | Freq: Once | ORAL | Status: AC
  Administered 2019-06-20: 650 mg via ORAL

## 2019-06-20 MED ORDER — PEGFILGRASTIM 6 MG/0.6ML ~~LOC~~ PSKT
6.0000 mg | PREFILLED_SYRINGE | Freq: Once | SUBCUTANEOUS | Status: AC
  Administered 2019-06-20: 6 mg via SUBCUTANEOUS

## 2019-06-20 MED ORDER — ACETAMINOPHEN 325 MG PO TABS
ORAL_TABLET | ORAL | Status: AC
  Filled 2019-06-20: qty 2

## 2019-06-20 MED ORDER — SODIUM CHLORIDE 0.9% FLUSH
10.0000 mL | INTRAVENOUS | Status: DC | PRN
  Administered 2019-06-20: 10 mL
  Filled 2019-06-20: qty 10

## 2019-06-20 MED ORDER — ATROPINE SULFATE 1 MG/ML IJ SOLN: 0.5000 mg | Freq: Once | INTRAMUSCULAR | Status: DC | PRN

## 2019-06-20 MED ORDER — DIPHENHYDRAMINE HCL 50 MG/ML IJ SOLN
25.0000 mg | Freq: Once | INTRAMUSCULAR | Status: AC
  Administered 2019-06-20: 25 mg via INTRAVENOUS

## 2019-06-20 MED ORDER — DIPHENHYDRAMINE HCL 50 MG/ML IJ SOLN
INTRAMUSCULAR | Status: AC
  Filled 2019-06-20: qty 1

## 2019-06-20 MED ORDER — SODIUM CHLORIDE 0.9 % IV SOLN
Freq: Once | INTRAVENOUS | Status: AC
  Filled 2019-06-20: qty 250

## 2019-06-20 NOTE — Patient Instructions (Signed)
[place] Discharge Instructions for Patients Receiving Chemotherapy  Today you received the following chemotherapy agents: Sacituzumab govitecan-hziy [name]) To help prevent nausea and vomiting after your treatment, we encourage you to take your nausea medication as prescribed.    If you develop nausea and vomiting that is not controlled by your nausea medication, call the clinic.   BELOW ARE SYMPTOMS THAT SHOULD BE REPORTED IMMEDIATELY:  *FEVER GREATER THAN 100.5 F  *CHILLS WITH OR WITHOUT FEVER  NAUSEA AND VOMITING THAT IS NOT CONTROLLED WITH YOUR NAUSEA MEDICATION  *UNUSUAL SHORTNESS OF BREATH  *UNUSUAL BRUISING OR BLEEDING  TENDERNESS IN MOUTH AND THROAT WITH OR WITHOUT PRESENCE OF ULCERS  *URINARY PROBLEMS  *BOWEL PROBLEMS  UNUSUAL RASH Items with * indicate a potential emergency and should be followed up as soon as possible.  Feel free to call the clinic should you have any questions or concerns. The clinic phone number is [phone].  Please show the [place] at check-in to the Emergency Department and triage nurse.

## 2019-06-20 NOTE — Assessment & Plan Note (Signed)
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 Neoadjuvant chemotherapy with TCHPcompleted 02/17/2017 this would be followed by Herceptinand Perjetamaintenance for 1 yearcompleted September 2019 2.Bilateral mastectomies 03/28/2016:Bilateral mastectomies: Left mastectomy: IDC grade 2 0.9 cm, nodes negative, right mastectomy benign, ER 0%, PR 0%, HER-2 positive ratio 2.6 3.Adjuvant radiation4/08/2017 to 06/09/2017 4.Neratinib started 10/12/2017 discontinued due to diarrhea 5. Elbow fracture: Due to metastatic disease, palliative radiation therapy 6. 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 (1.6 cm to 1.3 cm) another nodule 1.9 cm to 1.6 cm. Stable lytic lesions right sternum, T12 vertebral body, left iliac bone, sacrum and right inferior pubic ramus. Stable healing fracture right posterolateral eighth rib  06/10/2019: Brain MRI: New 5 mm focus of enhancement posterior left frontal lobe (brain MRI was done because she has had  frequent falls) Stereotactic radiosurgery is being planned  Pain regimen: On methadone and Percocets and nerve blocks with Dr. Maryjean Ka Osteonecrosis of jaw: Discontinued Xgeva.  Continue with Sacituzumab-Govitecan.

## 2019-06-21 ENCOUNTER — Ambulatory Visit: Payer: 59 | Admitting: Hematology and Oncology

## 2019-06-21 LAB — THYROID PANEL WITH TSH
Free Thyroxine Index: 1.6 (ref 1.2–4.9)
T3 Uptake Ratio: 26 % (ref 24–39)
T4, Total: 6.1 ug/dL (ref 4.5–12.0)
TSH: 0.637 u[IU]/mL (ref 0.450–4.500)

## 2019-06-25 ENCOUNTER — Ambulatory Visit
Admission: RE | Admit: 2019-06-25 | Discharge: 2019-06-25 | Disposition: A | Discharge status: Home / Self Care | Payer: 59 | Source: Ambulatory Visit | Attending: Radiation Oncology | Admitting: Radiation Oncology

## 2019-06-25 ENCOUNTER — Other Ambulatory Visit: Payer: Self-pay

## 2019-06-25 DIAGNOSIS — C7931 Secondary malignant neoplasm of brain: Secondary | ICD-10-CM

## 2019-06-25 MED ORDER — SODIUM CHLORIDE 0.9% FLUSH
10.0000 mL | INTRAVENOUS | Status: DC | PRN
  Administered 2019-06-25: 10 mL via INTRAVENOUS

## 2019-06-25 MED ORDER — GADOBENATE DIMEGLUMINE 529 MG/ML IV SOLN
15.0000 mL | Freq: Once | INTRAVENOUS | Status: AC | PRN
  Administered 2019-06-25: 15 mL via INTRAVENOUS

## 2019-06-25 MED ORDER — HEPARIN SOD (PORK) LOCK FLUSH 100 UNIT/ML IV SOLN
500.0000 [IU] | Freq: Once | INTRAVENOUS | Status: DC
  Administered 2019-06-25: 500 [IU] via INTRAVENOUS

## 2019-06-25 NOTE — Progress Notes (Signed)
Has armband been applied?  Yes  Does patient have an allergy to IV contrast dye?: No   Has patient ever received premedication for IV contrast dye?: n/a  Does patient take metformin?: No  If patient does take metformin when was the last dose: n/a  Date of lab work: 06/20/2019 BUN: 16 CR: 0.78 EGfr: >60  IV site: Right Chest Port  Has IV site been added to flowsheet?   Yes

## 2019-06-26 ENCOUNTER — Ambulatory Visit
Admission: RE | Admit: 2019-06-26 | Discharge: 2019-06-26 | Disposition: A | Discharge status: Home / Self Care | Payer: 59 | Source: Ambulatory Visit | Attending: Radiation Oncology | Admitting: Radiation Oncology

## 2019-06-26 ENCOUNTER — Other Ambulatory Visit: Payer: Self-pay

## 2019-06-26 ENCOUNTER — Ambulatory Visit: Payer: 59 | Admitting: Radiation Oncology

## 2019-06-26 ENCOUNTER — Encounter: Payer: Self-pay | Admitting: *Deleted

## 2019-06-26 DIAGNOSIS — C50512 Malignant neoplasm of lower-outer quadrant of left female breast: Secondary | ICD-10-CM | POA: Diagnosis present

## 2019-06-26 DIAGNOSIS — C7951 Secondary malignant neoplasm of bone: Secondary | ICD-10-CM | POA: Diagnosis present

## 2019-06-26 DIAGNOSIS — C7931 Secondary malignant neoplasm of brain: Secondary | ICD-10-CM

## 2019-06-26 DIAGNOSIS — Z171 Estrogen receptor negative status [ER-]: Secondary | ICD-10-CM | POA: Diagnosis not present

## 2019-06-26 MED ORDER — HEPARIN SOD (PORK) LOCK FLUSH 100 UNIT/ML IV SOLN
500.0000 [IU] | Freq: Once | INTRAVENOUS | Status: AC
  Administered 2019-06-26: 500 [IU] via INTRAVENOUS

## 2019-06-26 MED ORDER — SODIUM CHLORIDE 0.9% FLUSH
10.0000 mL | Freq: Once | INTRAVENOUS | Status: AC
  Administered 2019-06-26: 10 mL via INTRAVENOUS

## 2019-06-26 NOTE — Addendum Note (Signed)
Encounter addended by: Cori Razor, RN on: 06/26/2019 2:02 PM  Actions taken: Order list changed, Diagnosis association updated, MAR administration accepted

## 2019-07-03 DIAGNOSIS — C7951 Secondary malignant neoplasm of bone: Secondary | ICD-10-CM | POA: Diagnosis present

## 2019-07-03 DIAGNOSIS — C50512 Malignant neoplasm of lower-outer quadrant of left female breast: Secondary | ICD-10-CM | POA: Insufficient documentation

## 2019-07-03 DIAGNOSIS — Z171 Estrogen receptor negative status [ER-]: Secondary | ICD-10-CM | POA: Diagnosis not present

## 2019-07-04 ENCOUNTER — Encounter: Payer: Self-pay | Admitting: Radiation Oncology

## 2019-07-04 ENCOUNTER — Other Ambulatory Visit: Payer: Self-pay

## 2019-07-04 ENCOUNTER — Other Ambulatory Visit: Payer: Self-pay | Admitting: Hematology and Oncology

## 2019-07-04 ENCOUNTER — Ambulatory Visit
Admission: RE | Admit: 2019-07-04 | Discharge: 2019-07-04 | Disposition: A | Discharge status: Home / Self Care | Payer: 59 | Source: Ambulatory Visit | Attending: Radiation Oncology | Admitting: Radiation Oncology

## 2019-07-04 DIAGNOSIS — C50512 Malignant neoplasm of lower-outer quadrant of left female breast: Secondary | ICD-10-CM | POA: Diagnosis not present

## 2019-07-04 NOTE — Progress Notes (Signed)
Laura Anthony rested with Laura Anthony for 15 minutes following her [place] treatment.  Laura Anthony denies headache, dizziness, nausea, diplopia or ringing in the ears. Denies fatigue. Laura Anthony without complaints. Understands to avoid strenuous activity for the next 24 hours and call [phone] with needs.    BP 119/83 (BP Location: Right Arm, Laura Anthony Position: Sitting, Cuff Size: Normal)   Pulse 82   Temp 97.7 F (36.5 C)   Resp 20   SpO2 98%   Laura Anthony M. Leonie Green, BSN

## 2019-07-04 NOTE — Procedures (Signed)
  Name: Jodelle Mcquaide  MRN: [id number]  Date: 07/04/2019   DOB: 1975/09/17  Stereotactic Radiosurgery Operative Note  PRE-OPERATIVE DIAGNOSIS:  Solitary Brain Metastasis  POST-OPERATIVE DIAGNOSIS:  Solitary Brain Metastasis  PROCEDURE:  Stereotactic Radiosurgery  SURGEON:  Tighe Gitto P, MD  NARRATIVE: The patient underwent a radiation treatment planning session in the radiation oncology simulation suite under the care of the radiation oncology physician and physicist.  I participated closely in the radiation treatment planning afterwards. The patient underwent planning CT which was fused to 3T high resolution MRI with 1 mm axial slices.  These images were fused on the planning system.  We contoured the gross target volumes and subsequently expanded this to yield the Planning Target Volume. I actively participated in the planning process.  I helped to define and review the target contours and also the contours of the optic pathway, eyes, brainstem and selected nearby organs at risk.  All the dose constraints for critical structures were reviewed and compared to AAPM Task Group 101.  The prescription dose conformity was reviewed.  I approved the plan electronically.    Accordingly, Donata Duff was brought to the TrueBeam stereotactic radiation treatment linac and placed in the custom immobilization mask.  The patient was aligned according to the IR fiducial markers with BrainLab Exactrac, then orthogonal x-rays were used in ExacTrac with the 6DOF robotic table and the shifts were made to align the patient  Donata Duff received stereotactic radiosurgery uneventfully.    The detailed description of the procedure is recorded in the radiation oncology procedure note.  I was present for the duration of the procedure.  DISPOSITION:  Following delivery, the patient was transported to nursing in stable condition and monitored for possible acute effects to be discharged to  home in stable condition with follow-up in one month.  Trooper Olander P, MD 07/04/2019 10:36 AM

## 2019-07-05 ENCOUNTER — Inpatient Hospital Stay (HOSPITAL_BASED_OUTPATIENT_CLINIC_OR_DEPARTMENT_OTHER): Payer: 59 | Admitting: Adult Health

## 2019-07-05 ENCOUNTER — Inpatient Hospital Stay: Payer: 59

## 2019-07-05 ENCOUNTER — Other Ambulatory Visit: Payer: Self-pay | Admitting: *Deleted

## 2019-07-05 ENCOUNTER — Other Ambulatory Visit: Payer: Self-pay

## 2019-07-05 ENCOUNTER — Encounter: Payer: Self-pay | Admitting: Oncology

## 2019-07-05 ENCOUNTER — Inpatient Hospital Stay: Payer: 59 | Attending: Hematology and Oncology

## 2019-07-05 ENCOUNTER — Encounter: Payer: Self-pay | Admitting: Adult Health

## 2019-07-05 VITALS — BP 99/72 | HR 86 | Temp 98.6°F | Resp 16

## 2019-07-05 VITALS — BP 118/88 | HR 110 | Temp 98.3°F | Resp 17 | Ht 67.0 in | Wt 173.1 lb

## 2019-07-05 DIAGNOSIS — F419 Anxiety disorder, unspecified: Secondary | ICD-10-CM | POA: Insufficient documentation

## 2019-07-05 DIAGNOSIS — C7951 Secondary malignant neoplasm of bone: Secondary | ICD-10-CM

## 2019-07-05 DIAGNOSIS — Z171 Estrogen receptor negative status [ER-]: Secondary | ICD-10-CM | POA: Diagnosis not present

## 2019-07-05 DIAGNOSIS — C50512 Malignant neoplasm of lower-outer quadrant of left female breast: Secondary | ICD-10-CM

## 2019-07-05 DIAGNOSIS — C7931 Secondary malignant neoplasm of brain: Secondary | ICD-10-CM | POA: Insufficient documentation

## 2019-07-05 DIAGNOSIS — C50919 Malignant neoplasm of unspecified site of unspecified female breast: Secondary | ICD-10-CM

## 2019-07-05 DIAGNOSIS — Z9013 Acquired absence of bilateral breasts and nipples: Secondary | ICD-10-CM | POA: Diagnosis not present

## 2019-07-05 DIAGNOSIS — Z79899 Other long term (current) drug therapy: Secondary | ICD-10-CM | POA: Diagnosis not present

## 2019-07-05 DIAGNOSIS — Z7189 Other specified counseling: Secondary | ICD-10-CM

## 2019-07-05 DIAGNOSIS — C7800 Secondary malignant neoplasm of unspecified lung: Secondary | ICD-10-CM | POA: Insufficient documentation

## 2019-07-05 DIAGNOSIS — Z923 Personal history of irradiation: Secondary | ICD-10-CM | POA: Insufficient documentation

## 2019-07-05 DIAGNOSIS — Z9221 Personal history of antineoplastic chemotherapy: Secondary | ICD-10-CM | POA: Insufficient documentation

## 2019-07-05 DIAGNOSIS — Z95828 Presence of other vascular implants and grafts: Secondary | ICD-10-CM

## 2019-07-05 DIAGNOSIS — Z5112 Encounter for antineoplastic immunotherapy: Secondary | ICD-10-CM | POA: Diagnosis not present

## 2019-07-05 LAB — CBC WITH DIFFERENTIAL (CANCER CENTER ONLY)
Abs Immature Granulocytes: 0.03 10*3/uL (ref 0.00–0.07)
Basophils Absolute: 0 10*3/uL (ref 0.0–0.1)
Basophils Relative: 1 %
Eosinophils Absolute: 0.1 10*3/uL (ref 0.0–0.5)
Eosinophils Relative: 1 %
HCT: 35.2 % — ABNORMAL LOW (ref 36.0–46.0)
Hemoglobin: 11.1 g/dL — ABNORMAL LOW (ref 12.0–15.0)
Immature Granulocytes: 1 %
Lymphocytes Relative: 17 %
Lymphs Abs: 1 10*3/uL (ref 0.7–4.0)
MCH: 34.2 pg — ABNORMAL HIGH (ref 26.0–34.0)
MCHC: 31.5 g/dL (ref 30.0–36.0)
MCV: 108.3 fL — ABNORMAL HIGH (ref 80.0–100.0)
Monocytes Absolute: 0.6 10*3/uL (ref 0.1–1.0)
Monocytes Relative: 11 %
Neutro Abs: 4.3 10*3/uL (ref 1.7–7.7)
Neutrophils Relative %: 69 %
Platelet Count: 159 10*3/uL (ref 150–400)
RBC: 3.25 MIL/uL — ABNORMAL LOW (ref 3.87–5.11)
RDW: 18.5 % — ABNORMAL HIGH (ref 11.5–15.5)
WBC Count: 6.1 10*3/uL (ref 4.0–10.5)
nRBC: 0 % (ref 0.0–0.2)

## 2019-07-05 LAB — CMP (CANCER CENTER ONLY)
ALT: 22 U/L (ref 0–44)
AST: 16 U/L (ref 15–41)
Albumin: 3.3 g/dL — ABNORMAL LOW (ref 3.5–5.0)
Alkaline Phosphatase: 165 U/L — ABNORMAL HIGH (ref 38–126)
Anion gap: 9 (ref 5–15)
BUN: 11 mg/dL (ref 6–20)
CO2: 25 mmol/L (ref 22–32)
Calcium: 8.8 mg/dL — ABNORMAL LOW (ref 8.9–10.3)
Chloride: 105 mmol/L (ref 98–111)
Creatinine: 0.78 mg/dL (ref 0.44–1.00)
GFR, Est AFR Am: >60 mL/min (ref >60)
GFR, Estimated: >60 mL/min (ref >60)
Glucose, Bld: 78 mg/dL (ref 70–99)
Potassium: 4 mmol/L (ref 3.5–5.1)
Sodium: 139 mmol/L (ref 135–145)
Total Bilirubin: 0.3 mg/dL (ref 0.3–1.2)
Total Protein: 7.2 g/dL (ref 6.5–8.1)

## 2019-07-05 LAB — PHOSPHORUS: Phosphorus: 3.2 mg/dL (ref 2.5–4.6)

## 2019-07-05 LAB — MAGNESIUM: Magnesium: 2.2 mg/dL (ref 1.7–2.4)

## 2019-07-05 MED ORDER — PALONOSETRON HCL INJECTION 0.25 MG/5ML
0.2500 mg | Freq: Once | INTRAVENOUS | Status: AC
  Administered 2019-07-05: 0.25 mg via INTRAVENOUS

## 2019-07-05 MED ORDER — DIPHENHYDRAMINE HCL 50 MG/ML IJ SOLN
INTRAMUSCULAR | Status: AC
  Filled 2019-07-05: qty 1

## 2019-07-05 MED ORDER — SODIUM CHLORIDE 0.9 % IV SOLN
Freq: Once | INTRAVENOUS | Status: AC
  Filled 2019-07-05: qty 250

## 2019-07-05 MED ORDER — PALONOSETRON HCL INJECTION 0.25 MG/5ML
INTRAVENOUS | Status: AC
  Filled 2019-07-05: qty 5

## 2019-07-05 MED ORDER — SODIUM CHLORIDE 0.9 % IV SOLN
10.0000 mg | Freq: Once | INTRAVENOUS | Status: AC
  Administered 2019-07-05: 10 mg via INTRAVENOUS
  Filled 2019-07-05: qty 10

## 2019-07-05 MED ORDER — SODIUM CHLORIDE 0.9 % IV SOLN
3.0000 mg/kg | Freq: Once | INTRAVENOUS | Status: AC
  Administered 2019-07-05: 220 mg via INTRAVENOUS
  Filled 2019-07-05: qty 22

## 2019-07-05 MED ORDER — SODIUM CHLORIDE 0.9% FLUSH
10.0000 mL | INTRAVENOUS | Status: DC | PRN
  Administered 2019-07-05: 10 mL
  Filled 2019-07-05: qty 10

## 2019-07-05 MED ORDER — FAMOTIDINE IN NACL 20-0.9 MG/50ML-% IV SOLN
INTRAVENOUS | Status: AC
  Filled 2019-07-05: qty 50

## 2019-07-05 MED ORDER — SODIUM CHLORIDE 0.9% FLUSH
10.0000 mL | INTRAVENOUS | Status: DC | PRN
  Filled 2019-07-05: qty 10

## 2019-07-05 MED ORDER — FAMOTIDINE IN NACL 20-0.9 MG/50ML-% IV SOLN
20.0000 mg | Freq: Once | INTRAVENOUS | Status: AC
  Administered 2019-07-05: 20 mg via INTRAVENOUS

## 2019-07-05 MED ORDER — GABAPENTIN 300 MG PO CAPS: ORAL_CAPSULE | ORAL | 3 refills | Status: DC

## 2019-07-05 MED ORDER — SODIUM CHLORIDE 0.9 % IV SOLN
150.0000 mg | Freq: Once | INTRAVENOUS | Status: AC
  Administered 2019-07-05: 150 mg via INTRAVENOUS
  Filled 2019-07-05: qty 150

## 2019-07-05 MED ORDER — DIPHENHYDRAMINE HCL 50 MG/ML IJ SOLN
25.0000 mg | Freq: Once | INTRAMUSCULAR | Status: AC
  Administered 2019-07-05: 25 mg via INTRAVENOUS

## 2019-07-05 MED ORDER — HEPARIN SOD (PORK) LOCK FLUSH 100 UNIT/ML IV SOLN
500.0000 [IU] | Freq: Once | INTRAVENOUS | Status: AC | PRN
  Administered 2019-07-05: 500 [IU]
  Filled 2019-07-05: qty 5

## 2019-07-05 MED ORDER — VENLAFAXINE HCL ER 150 MG PO CP24: 150.0000 mg | ORAL_CAPSULE | Freq: Every day | ORAL | 6 refills | Status: DC

## 2019-07-05 MED ORDER — ACETAMINOPHEN 325 MG PO TABS
650.0000 mg | ORAL_TABLET | Freq: Once | ORAL | Status: AC
  Administered 2019-07-05: 650 mg via ORAL

## 2019-07-05 MED ORDER — METHADONE HCL 10 MG PO TABS: 10.0000 mg | ORAL_TABLET | Freq: Two times a day (BID) | ORAL | 0 refills | Status: DC

## 2019-07-05 MED ORDER — ACETAMINOPHEN 325 MG PO TABS
ORAL_TABLET | ORAL | Status: AC
  Filled 2019-07-05: qty 2

## 2019-07-05 NOTE — Patient Instructions (Signed)

## 2019-07-05 NOTE — Progress Notes (Signed)
[facility] Follow up:    Laura Noon, MD [place] [address]   DIAGNOSIS: Cancer Staging Malignant neoplasm of lower-outer quadrant of left breast of female, estrogen receptor negative ([place]) Staging form: Breast, AJCC 8th Edition - Clinical: Stage IIA (cT1c, cN1, cM0, G2, ER: Negative, PR: Negative, HER2: Positive) - Signed by Gardenia Phlegm, NP on 11/02/2016 - Pathologic: No Stage Recommended (ypT1b, pN0, cM0, G2, ER: Negative, PR: Negative, HER2: Positive) - Unsigned   SUMMARY OF ONCOLOGIC HISTORY: 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, estrogen receptor negative ([place])  10/20/2016 Mammogram   Mammogram and ultrasound of the left breast revealed 1.7 cm mass at 4:00 position, 6:30 position 5 x 4 x 4 mm mass, 6:00 position 5 cm nipple 7 x 6 x 11 mm, left axillary lymph node with thickened cortex, T1c N1 stage II a AJCC 8   10/24/2016 Initial Diagnosis   Left breast biopsy 6:30 position 3 cm from nipple: IDC grade 2, DCIS, ER 0%, PR 0%, Ki-67 15% HER-2 positive ratio 2.1; 4:00 position 3 cm from nipple: IDC grade 2, DCIS, ER 0%, PR 0%, Ki-67 35%, HER-2 positive ratio 2.02; left axillary lymph node biopsy positive   11/04/2016 - 02/17/2017 Neo-Adjuvant Chemotherapy   TCH Perjeta 6 cycles followed by Herceptin + Perjeta maintenance to be completed September 2019   11/30/2016 Genetic Testing   Negative genetic testing on the common hereditary cancer panel.  The Hereditary Gene Panel offered by [facility] includes sequencing and/or deletion duplication testing of the following 47 genes: APC, ATM, AXIN2, BARD1, BMPR1A, BRCA1, BRCA2, BRIP1, CDH1, CDK4, CDKN2A (p14ARF), CDKN2A (p16INK4a), CHEK2, CTNNA1, DICER1, EPCAM (Deletion/duplication testing only), GREM1 (promoter region deletion/duplication testing only), KIT, MEN1, MLH1, MSH2, MSH3, MSH6, MUTYH, NBN, NF1, NHTL1, PALB2, PDGFRA, PMS2, POLD1,  POLE, PTEN, RAD50, RAD51C, RAD51D, SDHB, SDHC, SDHD, SMAD4, SMARCA4. STK11, TP53, TSC1, TSC2, and VHL.  The following genes were evaluated for sequence changes only: SDHA and HOXB13 c.251G>A variant only. The report date is November 30, 2016.    03/27/2017 Surgery   Bilateral mastectomies: Left mastectomy: IDC grade 2 0.9 cm, nodes negative, right mastectomy benign, ER 0%, PR 0%, HER-2 positive ratio 2.6   05/08/2017 - 06/09/2017 Radiation Therapy   Adjuvant radiation therapy   10/23/2017 Miscellaneous   Neratinib discontinued after 4 weeks for severe diarrhea   07/25/2018 Relapse/Recurrence   MRI of right elbow showed bone lesion consistent with malignancy. PET scan showed bilateral pulmonary nodules and several lytic bone lesions compatible with metastatic disease. Brain MRI on 08/02/18 showed no evidence of metastatic disease.   08/02/2018 PET scan   Bilateral hypermetabolic lung nodules, LUL 1.3 cm with SUV 3.88, lingular nodule 1.4 cm SUV 3.7, central lingular nodule 1.2 cm SUV 9.76, right middle lobe nodule 1.5 cm SUV 9.9, lytic bone metastases inferior pubic ramus, sacrum, T12, right 11th rib.   08/08/2018 Procedure   Lung biopsy: metastatic carcinoma, HER-2 negative (0), ER/PR negative.   08/10/2018 -  Radiation Therapy   Palliative radiatio to the right humerus along the medial condyle   08/24/2018 - 09/05/2018 Radiation Therapy   Palliative radiation to the right 11th rib and right elbow   09/26/2018 - 12/04/2018 Chemotherapy   Carboplatin atezolizumab at [facility] with Dr. Janan Halter on [place] clinical trial stopped because of new T5 metastases (toxicities included myopathy required prednisone, immune mediated thyroiditis), right upper extremity DVT on apixaban  12/14/2018 - 04/10/2019 Chemotherapy   The patient had pegfilgrastim-bmez (ZIEXTENZO) injection 6 mg, 6 mg, Subcutaneous,  Once, 2 of 2 cycles Administration: 6 mg (03/02/2019), 6 mg (02/16/2019), 6 mg (03/16/2019), 6 mg  (03/29/2019) eriBULin mesylate (HALAVEN) 2.7 mg in sodium chloride 0.9 % 100 mL chemo infusion, 1.42 mg/m2 = 2.65 mg, Intravenous,  Once, 4 of 4 cycles Dose modification: 1.1 mg/m2 (original dose 1.4 mg/m2, Cycle 2, Reason: Dose not tolerated, Comment: neutropenic fever), 0.7 mg/m2 (original dose 1.4 mg/m2, Cycle 4, Reason: Provider Judgment), 0.7 mg/m2 (original dose 1.4 mg/m2, Cycle 4, Reason: Provider Judgment) Administration: 2.7 mg (12/14/2018), 2.7 mg (12/21/2018), 2 mg (01/03/2019), 2 mg (01/10/2019), 1.35 mg (02/15/2019), 1.35 mg (03/01/2019), 1.35 mg (03/15/2019), 1.35 mg (03/28/2019)  for chemotherapy treatment.    12/17/2018 -  Radiation Therapy   Palliative radiation to sternal, sacral & pelvic lesions and SRS for T3 and C7-T1 lesions.   04/11/2019 -  Chemotherapy   The patient had palonosetron (ALOXI) injection 0.25 mg, 0.25 mg, Intravenous,  Once, 4 of 7 cycles Administration: 0.25 mg (04/11/2019), 0.25 mg (04/17/2019), 0.25 mg (05/02/2019), 0.25 mg (05/09/2019), 0.25 mg (05/23/2019), 0.25 mg (05/29/2019), 0.25 mg (06/13/2019), 0.25 mg (06/20/2019) pegfilgrastim (NEULASTA) injection 6 mg, 6 mg, Subcutaneous, Once, 1 of 1 cycle Administration: 6 mg (04/18/2019) pegfilgrastim (NEULASTA ONPRO KIT) injection 6 mg, 6 mg, Subcutaneous, Once, 3 of 6 cycles Administration: 6 mg (05/29/2019), 6 mg (05/09/2019), 6 mg (06/20/2019) fosaprepitant (EMEND) 150 mg in sodium chloride 0.9 % 145 mL IVPB, 150 mg, Intravenous,  Once, 4 of 7 cycles Administration: 150 mg (04/11/2019), 150 mg (04/17/2019), 150 mg (05/02/2019), 150 mg (05/09/2019), 150 mg (05/23/2019), 150 mg (05/29/2019), 150 mg (06/13/2019), 150 mg (06/20/2019) sacituzumab govitecan-hziy (TRODELVY) 590 mg in sodium chloride 0.9 % 250 mL (1.9094 mg/mL) chemo infusion, 8 mg/kg = 590 mg (100 % of original dose 8 mg/kg), Intravenous,  Once, 4 of 7 cycles Dose modification: 8 mg/kg (original dose 8 mg/kg, Cycle 1, Reason: Provider Judgment), 4 mg/kg (original dose 4 mg/kg,  Cycle 2, Reason: Other (see comments), Comment: split bags), 3 mg/kg (original dose 4 mg/kg, Cycle 2, Reason: Provider Judgment), 4 mg/kg (original dose 4 mg/kg, Cycle 2, Reason: Other (see comments), Comment: split bags) Administration: 590 mg (04/11/2019), 590 mg (04/17/2019), 590 mg (05/02/2019), 220 mg (05/09/2019), 220 mg (05/23/2019), 220 mg (05/29/2019), 220 mg (06/13/2019), 220 mg (06/20/2019), 220 mg (05/09/2019), 220 mg (05/23/2019), 220 mg (05/29/2019), 220 mg (06/13/2019), 220 mg (06/20/2019)  for chemotherapy treatment.    Bone metastases ([place])  08/07/2018 Initial Diagnosis   Bone metastases ([place])   12/14/2018 - 04/24/2019 Chemotherapy   The patient had pertuzumab (PERJETA) 420 mg in sodium chloride 0.9 % 250 mL chemo infusion, 420 mg (100 % of original dose 420 mg), Intravenous, Once, 5 of 6 cycles Dose modification: 420 mg (original dose 420 mg, Cycle 1, Reason: Provider Judgment) Administration: 420 mg (12/14/2018), 420 mg (01/10/2019), 420 mg (03/28/2019), 420 mg (01/30/2019), 420 mg (03/01/2019) trastuzumab-dkst (OGIVRI) 600 mg in sodium chloride 0.9 % 250 mL chemo infusion, 609 mg, Intravenous,  Once, 5 of 6 cycles Administration: 600 mg (12/14/2018), 450 mg (01/10/2019), 450 mg (03/28/2019), 450 mg (01/30/2019), 450 mg (03/01/2019)  for chemotherapy treatment.    12/17/2018 -  Radiation Therapy   Palliative radiation to sternal, sacral & pelvic lesions and SRS for T3 and C7-T1 lesions.     CURRENT THERAPY:  Laura Anthony every 21 days  INTERVAL HISTORY: Laura Anthony 43 y.o. female returns for  evaluation of her metastatic breast cancer to the lung, bone and brain.  She is receiving treatment with Laura Anthony every 21 days.  She is tolerating this well.  She was previously taking Methadone and Percocet.  PMP aware review notes that she last had these filled on 04/01/2019 and 04/17/2019 by Dr. Lindi Adie.  She is also taking Ambien, and Lorazepam.    Laura Anthony continues to take Methadone, and is taking  this as 62m PO BID.  Her pain is controlled on this.  She has undergone nerve blocks with Dr. HMaryjean Ka  She is not requiring any breakthrough pain medication with Percocet.  She denies any excessive somnolence.    She has some tingling in her fingers and toes and is taking Gabapentin, 6057mat night and 30046mn the morning.  She has had a couple of mis steps and falls.    [facility] for her frontal brain lesion.  She notes feeling tired.    She is feeling increasingly anxious and wants to know if I can increase her effexor.  She has been on 75 mg per day for the past month.     Patient Active Problem List   Diagnosis Date Noted  . Palliative care patient 01/06/2019  . Inadequate pain control 01/02/2019  . Acute embolism and thrombosis of unspecified deep veins of unspecified lower extremity (HCCBrazil1/25/2020  . Neutropenic fever (HCCHutchinson1/25/2020  . Sorethroat 12/26/2018  . 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 Neck pain 12/09/2018  . Hypothyroidism 12/09/2018  . Goals of care, counseling/discussion 12/06/2018  . Metastatic breast cancer (HCCDelmar7/09/2018  . Bone metastases (HCCKaufman7/08/2018  . Pain from bone metastases (HCCChenango Bridge7/08/2018  . Family history of breast cancer 04/02/2018  . Family history of prostate cancer 04/02/2018  . History of therapeutic radiation 10/16/2017  . Acquired absence of both breasts 04/03/2017  . Breast cancer, stage 2, left (HCCKula2/25/2019  . Port-A-Cath in place 12/16/2016  . Genetic testing 12/06/2016  . Encounter for antineoplastic chemotherapy 11/25/2016  . Malignant neoplasm of lower-outer quadrant of left breast of female, estrogen receptor negative (HCCRoyal Palm Estates9/27/2018  . Breast lump on left side at 1 o'clock position 05/22/2015  . Migraine without aura or status migrainosus 05/29/2014  . Hyperlipidemia LDL goal <100 10/22/2013  . Depression  09/09/2013  . Rotator cuff impingement syndrome of left shoulder 07/12/2013  . ADD (attention deficit disorder) 01/09/2013  . Anxiety 12/13/2012  . Insomnia 12/13/2012    is allergic to statins; sumatriptan; and tape.  MEDICAL HISTORY: Past Medical History:  Diagnosis Date  . ADD (attention deficit disorder)   . Anemia   . Anxiety   . Breast cancer, left breast (HCCReynolds Heights  S/P mastectomy 03/27/2017  . DVT (deep venous thrombosis) (HCCBradshaw017   calf left - [facility]-took eliquis x3 mos, nonthing now  . High cholesterol   . Impingement syndrome of right shoulder 07/2013  . Migraine    "usually 1/month" (03/28/2017)  . PONV (postoperative nausea and vomiting)   . Right bicipital tenosynovitis 07/2013  . Rotator cuff impingement syndrome of right shoulder 07/12/2013  . Seizures (HCCIndian Shores  x 1 as a child - was never on anticonvulsants (03/28/2017)    SURGICAL HISTORY: Past Surgical History:  Procedure Laterality Date  . ADENOIDECTOMY  1981  . ANKLE ARTHROSCOPY Right   . BREAST BIOPSY Left  10/2016  . KNEE ARTHROSCOPY Right   . KNEE ARTHROSCOPY W/ ACL RECONSTRUCTION Left   . LIPOSUCTION WITH LIPOFILLING Bilateral 01/05/2018   Procedure: LIPOFILLING FROM ABDOMEN TO BILATERAL CHEST;  Surgeon: Irene Limbo, MD;  Location: [place];  Service: Plastics;  Laterality: Bilateral;  . MASTECTOMY Left 03/27/2017   NIPPLE SPARING MASTECTOMY WITH RADIOACTIVE SEED TARGETED LYMPH NODE EXCISION AND LEFT AXILLARY SENTINEL LYMPH NODE BIOPSY  . MASTECTOMY Right 03/27/2017   RIGHT PROPHYLACTIC NIPPLE SPARING MASTECTOMY  . NIPPLE SPARING MASTECTOMY Right 03/27/2017   Procedure: RIGHT PROPHYLACTIC NIPPLE SPARING MASTECTOMY;  Surgeon: Rolm Bookbinder, MD;  Location: [place];  Service: General;  Laterality: Right;  . PORT-A-CATH REMOVAL Right 01/05/2018   Procedure: REMOVAL RIGHT CHEST PORT;  Surgeon: Irene Limbo, MD;  Location: [place];  Service: Plastics;  Laterality: Right;  . PORTACATH PLACEMENT N/A 11/01/2016   Procedure: INSERTION PORT-A-CATH WITH [place];  Surgeon: Rolm Bookbinder, MD;  Location: [place];  Service: General;  Laterality: N/A;  . PORTACATH PLACEMENT N/A 08/23/2018   Procedure: INSERTION PORT-A-CATH WITH ULTRASOUND;  Surgeon: Rolm Bookbinder, MD;  Location: [place];  Service: General;  Laterality: N/A;  . RADIOACTIVE SEED GUIDED AXILLARY SENTINEL LYMPH NODE Left 03/27/2017   Procedure: LEFT NIPPLE SPARING MASTECTOMY WITH RADIOACTIVE SEED TARGETED LYMPH NODE EXCISION AND LEFT AXILLARY SENTINEL LYMPH NODE BIOPSY;  Surgeon: Rolm Bookbinder, MD;  Location: [place];  Service: General;  Laterality: Left;  REQUESTS RNFA  . RECONSTRUCTION BREAST IMMEDIATE / DELAYED W/ TISSUE EXPANDER Bilateral 03/27/2017   BILATERAL BREAST RECONSTRUCTION WITH PLACEMENT OF TISSUE EXPANDER AND ALLODERM  . REMOVAL OF BILATERAL TISSUE EXPANDERS WITH PLACEMENT OF BILATERAL BREAST IMPLANTS Bilateral 01/05/2018   Procedure: REMOVAL OF BILATERAL TISSUE EXPANDERS WITH PLACEMENT OF BILATERAL BREAST IMPLANTS;  Surgeon: Irene Limbo, MD;  Location: [place];  Service: Plastics;  Laterality: Bilateral;  . SEPTOPLASTY WITH ETHMOIDECTOMY, AND MAXILLARY ANTROSTOMY  10/29/2010   bilat. max. antrostomy with left max. stripping; left ant. ethmoidectomy; right total ethmoidectomy; sphenoidotomy  . SHOULDER ARTHROSCOPY WITH SUBACROMIAL DECOMPRESSION AND BICEP TENDON REPAIR Right 07/12/2013   Procedure: RIGHT SHOULDER ARTHROSCOPY DEBRIDEMENT EXTENSIVE DECOMPRESSION SUBACROMIAL PARTIAL ACROMIOPLASTY;  Surgeon: Johnny Bridge, MD;  Location: [place];  Service: Orthopedics;  Laterality: Right;  . WRIST ARTHROSCOPY  01/17/2012   Procedure: ARTHROSCOPY WRIST; right wrist Surgeon: Tennis Must, MD;  Location: [place];  Service: Orthopedics;  Laterality: Right;  RIGHT  WRIST ARTHROSCOPY WITH TRIANGULAR FIBROCARTILAGE COMPLEX REPAIR AND DEBRIDEMENT     SOCIAL HISTORY: Social History   Socioeconomic History  . Marital status: Married    Spouse name: Not on file  . Number of children: Not on file  . Years of education: Not on file  . Highest education level: Not on file  Occupational History  . Not on file  Tobacco Use  . Smoking status: Never Smoker  . Smokeless tobacco: Never Used  Substance and Sexual Activity  . Alcohol use: Yes    Comment: Drinks very rare  . Drug use: No  . Sexual activity: Yes    Birth control/protection: Surgical    Comment: husband has had a vasectomy  Other Topics Concern  . Not on file  Social History Narrative  . Not on file   Social Determinants of Health   Financial Resource Strain:   . Difficulty of Paying Living Expenses:   Food Insecurity:   . Worried About Running  Out of Food in the Last Year:   . [place] in the Last Year:   Transportation Needs:   . Lack of Transportation (Medical):   Marland Kitchen Lack of Transportation (Non-Medical):   Physical Activity:   . Days of Exercise per Week:   . Minutes of Exercise per Session:   Stress:   . Feeling of Stress :   Social Connections:   . Frequency of Communication with Friends and Family:   . Frequency of Social Gatherings with Friends and Family:   . Attends Religious Services:   . Active Member of Clubs or Organizations:   . Attends Archivist Meetings:   Marland Kitchen Marital Status:   Intimate Partner Violence:   . Fear of Current or Ex-Partner:   . Emotionally Abused:   Marland Kitchen Physically Abused:   . Sexually Abused:     FAMILY HISTORY: Family History  Problem Relation Age of Onset  . Breast cancer Mother 67       triple negative  . Leukemia Father   . Lung cancer Father   . Heart attack Maternal Uncle   . Prostate cancer Paternal Uncle   . COPD Paternal Grandmother   . Heart disease Paternal Grandfather   . Prostate cancer Paternal Uncle   .  Leukemia Cousin     Review of Systems  Constitutional: Positive for fatigue. Negative for appetite change, chills, fever and unexpected weight change.  HENT:   Negative for hearing loss, lump/mass and trouble swallowing.   Eyes: Negative for eye problems and icterus.  Respiratory: Negative for chest tightness, cough and shortness of breath.   Cardiovascular: Negative for chest pain, leg swelling and palpitations.  Gastrointestinal: Negative for abdominal distention, abdominal pain, constipation, diarrhea, nausea and vomiting.  Endocrine: Negative for hot flashes.  Genitourinary: Negative for difficulty urinating.   Musculoskeletal: Positive for gait problem. Negative for arthralgias.  Skin: Negative for itching and rash.  Neurological: Positive for gait problem and numbness. Negative for dizziness and extremity weakness.  Hematological: Negative for adenopathy. Does not bruise/bleed easily.  Psychiatric/Behavioral: Negative for depression. The patient is nervous/anxious.       PHYSICAL EXAMINATION  ECOG PERFORMANCE STATUS: 2 - Symptomatic, <50% confined to bed  Vitals:   07/05/19 0851  BP: 118/88  Pulse: (!) 110  Resp: 17  Temp: 98.3 F (36.8 C)  SpO2: 97%    Physical Exam Constitutional:      General: She is not in acute distress.    Appearance: Normal appearance. She is not toxic-appearing.  HENT:     Head: Normocephalic and atraumatic.  Eyes:     General: No scleral icterus. Cardiovascular:     Rate and Rhythm: Normal rate and regular rhythm.     Pulses: Normal pulses.     Heart sounds: Normal heart sounds.  Pulmonary:     Effort: Pulmonary effort is normal.     Breath sounds: Normal breath sounds.  Abdominal:     General: Abdomen is flat. Bowel sounds are normal.     Palpations: Abdomen is soft.     Hernia: No hernia is present.  Musculoskeletal:        General: No swelling.     Cervical back: Neck supple.  Skin:    General: Skin is warm and dry.      Capillary Refill: Capillary refill takes less than 2 seconds.     Findings: No rash.  Neurological:     General: No focal deficit present.  Mental Status: She is alert and oriented to person, place, and time.  Psychiatric:        Mood and Affect: Mood normal.        Behavior: Behavior normal.     LABORATORY DATA:  CBC    Component Value Date/Time   WBC 6.1 07/05/2019 0835   WBC 11.0 (H) 01/04/2019 1649   RBC 3.25 (L) 07/05/2019 0835   HGB 11.1 (L) 07/05/2019 0835   HGB 11.1 (L) 01/27/2017 0802   HCT 35.2 (L) 07/05/2019 0835   HCT 33.4 (L) 01/27/2017 0802   PLT 159 07/05/2019 0835   PLT 170 01/27/2017 0802   MCV 108.3 (H) 07/05/2019 0835   MCV 99.0 01/27/2017 0802   MCH 34.2 (H) 07/05/2019 0835   MCHC 31.5 07/05/2019 0835   RDW 18.5 (H) 07/05/2019 0835   RDW 18.2 (H) 01/27/2017 0802   LYMPHSABS 1.0 07/05/2019 0835   LYMPHSABS 1.7 01/27/2017 0802   MONOABS 0.6 07/05/2019 0835   MONOABS 0.5 01/27/2017 0802   EOSABS 0.1 07/05/2019 0835   EOSABS 0.0 01/27/2017 0802   BASOSABS 0.0 07/05/2019 0835   BASOSABS 0.0 01/27/2017 0802    CMP     Component Value Date/Time   NA 139 07/05/2019 0835   NA 138 01/27/2017 0802   K 4.0 07/05/2019 0835   K 4.1 01/27/2017 0802   CL 105 07/05/2019 0835   CO2 25 07/05/2019 0835   CO2 26 01/27/2017 0802   GLUCOSE 78 07/05/2019 0835   GLUCOSE 90 01/27/2017 0802   BUN 11 07/05/2019 0835   BUN 14.0 01/27/2017 0802   CREATININE 0.78 07/05/2019 0835   CREATININE 0.7 01/27/2017 0802   CALCIUM 8.8 (L) 07/05/2019 0835   CALCIUM 8.6 01/27/2017 0802   PROT 7.2 07/05/2019 0835   PROT 6.5 01/27/2017 0802   ALBUMIN 3.3 (L) 07/05/2019 0835   ALBUMIN 3.5 01/27/2017 0802   AST 16 07/05/2019 0835   AST 22 01/27/2017 0802   ALT 22 07/05/2019 0835   ALT 23 01/27/2017 0802   ALKPHOS 165 (H) 07/05/2019 0835   ALKPHOS 85 01/27/2017 0802   BILITOT 0.3 07/05/2019 0835   BILITOT 0.23 01/27/2017 0802   GFRNONAA >60 07/05/2019 0835   GFRAA >60  07/05/2019 0835           ASSESSMENT and THERAPY PLAN:   Malignant neoplasm of lower-outer quadrant of left breast of female, estrogen receptor negative ([place]) 10/24/2016: Left breast biopsy 6:30 position 3 cm from nipple: IDC grade 2, DCIS, ER 0%, PR 0%, Ki-67 15% HER-2 positive ratio 2.1; 4:00 position 3 cm from nipple: IDC grade 2, DCIS, ER 0%, PR 0%, Ki-67 35%, HER-2 positive ratio 2.02 Lymph node biopsy positive  Treatment Summary: 1. Neoadjuvant chemotherapy with TCHPcompleted 02/17/2017 this would be followed by Herceptinand Perjetamaintenance for 1 yearcompleted September 2019 2.Bilateral mastectomies 03/28/2016:Bilateral mastectomies: Left mastectomy: IDC grade 2 0.9 cm, nodes negative, right mastectomy benign, ER 0%, PR 0%, HER-2 positive ratio 2.6 3.Adjuvant radiation4/08/2017 to 06/09/2017 4.Neratinib started 10/12/2017 discontinued due to diarrhea 5. Elbow fracture: Due to metastatic disease, palliative radiation therapy 6. Carboplatin atezolizumab at [facility] on a clinical trial [facility] 043 stopped for progression 12/04/2018 ---------------------------------------------------------------------------------------------------------------- Lung nodule biopsy: Metastatic breast cancer triple negative Current treatment: Sacituzumab-Govitecan days 1 and 8 every 3 weeks with Neulasta on day 9 started 04/11/2019, today is cycle 4  Toxicities: 1.Hoarseness of voice: Infusion reaction: We will proceed to give the treatment over 4 hours.She has done much better since  we made the change 2.Fatigue 3.Thrombocytopenia:Platelet count is stable 4.Anemia: Stable monitoring closely.Today's hemoglobin is improved  I refilled her Methadone at 14m Q12 H and updated her orders to reflect this.  PMP aware was reviewed and no red flags.  She will increase effexor to 1563m and we changed her gabapentin to 30098mn the morning and afternoon and 600m82m night.  I updated  this in her med list so she wouldn't continue to run out of gabapentin.    We will see her back in 1 week for day 8 of treatment and in 3 weeks for f/u with Dr. GudeLindi Adieestaging scans should be due around 08/2019.       Pain regimen: On methadone and Percocets and nerve blocks with Dr. HarkMaryjean Kaeonecrosis of jaw: Discontinued Xgeva.  Continue with Sacituzumab-Govitecan.    All questions were answered. The patient knows to call the clinic with any problems, questions or concerns. We can certainly see the patient much sooner if necessary.  Total encounter time: 30 minutes*  LindWilber Bihari 07/05/19 9:31 AM Medical Oncology and Hematology [facility] [id number]. [phone]. [phone] Encounter Time as defined by the Centers for Medicare and Medicaid Services includes, in addition to the face-to-face time of a patient visit (documented in the note above) non-face-to-face time: obtaining and reviewing outside history, ordering and reviewing medications, tests or procedures, care coordination (communications with other health care professionals or caregivers) and documentation in the medical record.

## 2019-07-05 NOTE — Patient Instructions (Signed)
[place] Discharge Instructions for Patients Receiving Chemotherapy  Today you received the following chemotherapy agent: Sactizumab-govitecan-hziy [name])  To help prevent nausea and vomiting after your treatment, we encourage you to take your nausea medication as directed by your MD.   If you develop nausea and vomiting that is not controlled by your nausea medication, call the clinic.   BELOW ARE SYMPTOMS THAT SHOULD BE REPORTED IMMEDIATELY:  *FEVER GREATER THAN 100.5 F  *CHILLS WITH OR WITHOUT FEVER  NAUSEA AND VOMITING THAT IS NOT CONTROLLED WITH YOUR NAUSEA MEDICATION  *UNUSUAL SHORTNESS OF BREATH  *UNUSUAL BRUISING OR BLEEDING  TENDERNESS IN MOUTH AND THROAT WITH OR WITHOUT PRESENCE OF ULCERS  *URINARY PROBLEMS  *BOWEL PROBLEMS  UNUSUAL RASH Items with * indicate a potential emergency and should be followed up as soon as possible.  Feel free to call the clinic should you have any questions or concerns. The clinic phone number is [phone].  Please show the [place] at check-in to the Emergency Department and triage nurse.

## 2019-07-05 NOTE — Assessment & Plan Note (Addendum)
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 Neoadjuvant chemotherapy with TCHPcompleted 02/17/2017 this would be followed by Herceptinand Perjetamaintenance for 1 yearcompleted September 2019 2.Bilateral mastectomies 03/28/2016:Bilateral mastectomies: Left mastectomy: IDC grade 2 0.9 cm, nodes negative, right mastectomy benign, ER 0%, PR 0%, HER-2 positive ratio 2.6 3.Adjuvant radiation4/08/2017 to 06/09/2017 4.Neratinib started 10/12/2017 discontinued due to diarrhea 5. Elbow fracture: Due to metastatic disease, palliative radiation therapy 6. Carboplatin atezolizumab at [facility] on a clinical trial [facility] 043 stopped for progression 12/04/2018 ---------------------------------------------------------------------------------------------------------------- Lung nodule biopsy: Metastatic breast cancer triple negative Current treatment: Sacituzumab-Govitecan days 1 and 8 every 3 weeks with Neulasta on day 9 started 04/11/2019, today is cycle 4  Toxicities: 1.Hoarseness of voice: Infusion reaction: We will proceed to give the treatment over 4 hours.She has done much better since we made the change 2.Fatigue 3.Thrombocytopenia:Platelet count is stable 4.Anemia: Stable monitoring closely.Today's hemoglobin is improved  I refilled her Methadone at 45m Q12 H and updated her orders to reflect this.  PMP aware was reviewed and no red flags.  She will increase effexor to 1535m and we changed her gabapentin to 30039mn the morning and afternoon and 600m99m night.  I updated this in her med list so she wouldn't continue to run out of gabapentin.    We will see her back in 1 week for day 8 of treatment and in 3 weeks for f/u with Dr. GudeLindi AdieRestaging scans should be due around 08/2019.       Pain regimen: On methadone and Percocets and nerve blocks with Dr. HarkMaryjean Kaeonecrosis of jaw: Discontinued Xgeva.  Continue with Sacituzumab-Govitecan.

## 2019-07-05 NOTE — Progress Notes (Signed)
Pt. declined to stay for post 30 minutes observation. States she feels "fine" and has not had any issues. Left via ambulation, no respiratory distress noted.

## 2019-07-08 ENCOUNTER — Telehealth: Payer: Self-pay | Admitting: Adult Health

## 2019-07-08 NOTE — Telephone Encounter (Signed)
No 6/4 los. No changes made to pt's schedule.  

## 2019-07-11 ENCOUNTER — Ambulatory Visit: Payer: 59

## 2019-07-11 ENCOUNTER — Other Ambulatory Visit: Payer: Self-pay

## 2019-07-11 ENCOUNTER — Inpatient Hospital Stay: Payer: 59

## 2019-07-11 ENCOUNTER — Encounter: Payer: Self-pay | Admitting: *Deleted

## 2019-07-11 VITALS — BP 115/82 | HR 80 | Temp 99.1°F | Resp 17

## 2019-07-11 DIAGNOSIS — C7951 Secondary malignant neoplasm of bone: Secondary | ICD-10-CM

## 2019-07-11 DIAGNOSIS — C50512 Malignant neoplasm of lower-outer quadrant of left female breast: Secondary | ICD-10-CM

## 2019-07-11 DIAGNOSIS — Z171 Estrogen receptor negative status [ER-]: Secondary | ICD-10-CM

## 2019-07-11 DIAGNOSIS — Z7189 Other specified counseling: Secondary | ICD-10-CM

## 2019-07-11 DIAGNOSIS — Z95828 Presence of other vascular implants and grafts: Secondary | ICD-10-CM

## 2019-07-11 DIAGNOSIS — G893 Neoplasm related pain (acute) (chronic): Secondary | ICD-10-CM

## 2019-07-11 LAB — CBC WITH DIFFERENTIAL (CANCER CENTER ONLY)
Abs Immature Granulocytes: 0.06 10*3/uL (ref 0.00–0.07)
Basophils Absolute: 0.1 10*3/uL (ref 0.0–0.1)
Basophils Relative: 1 %
Eosinophils Absolute: 0.1 10*3/uL (ref 0.0–0.5)
Eosinophils Relative: 2 %
HCT: 32.7 % — ABNORMAL LOW (ref 36.0–46.0)
Hemoglobin: 10.4 g/dL — ABNORMAL LOW (ref 12.0–15.0)
Immature Granulocytes: 1 %
Lymphocytes Relative: 20 %
Lymphs Abs: 1 10*3/uL (ref 0.7–4.0)
MCH: 33.9 pg (ref 26.0–34.0)
MCHC: 31.8 g/dL (ref 30.0–36.0)
MCV: 106.5 fL — ABNORMAL HIGH (ref 80.0–100.0)
Monocytes Absolute: 0.6 10*3/uL (ref 0.1–1.0)
Monocytes Relative: 11 %
Neutro Abs: 3.5 10*3/uL (ref 1.7–7.7)
Neutrophils Relative %: 65 %
Platelet Count: 159 10*3/uL (ref 150–400)
RBC: 3.07 MIL/uL — ABNORMAL LOW (ref 3.87–5.11)
RDW: 16.7 % — ABNORMAL HIGH (ref 11.5–15.5)
WBC Count: 5.3 10*3/uL (ref 4.0–10.5)
nRBC: 0 % (ref 0.0–0.2)

## 2019-07-11 LAB — CMP (CANCER CENTER ONLY)
ALT: 20 U/L (ref 0–44)
AST: 18 U/L (ref 15–41)
Albumin: 3.1 g/dL — ABNORMAL LOW (ref 3.5–5.0)
Alkaline Phosphatase: 134 U/L — ABNORMAL HIGH (ref 38–126)
Anion gap: 7 (ref 5–15)
BUN: 12 mg/dL (ref 6–20)
CO2: 28 mmol/L (ref 22–32)
Calcium: 8.9 mg/dL (ref 8.9–10.3)
Chloride: 101 mmol/L (ref 98–111)
Creatinine: 0.75 mg/dL (ref 0.44–1.00)
GFR, Est AFR Am: >60 mL/min (ref >60)
GFR, Estimated: >60 mL/min (ref >60)
Glucose, Bld: 85 mg/dL (ref 70–99)
Potassium: 4 mmol/L (ref 3.5–5.1)
Sodium: 136 mmol/L (ref 135–145)
Total Bilirubin: 0.3 mg/dL (ref 0.3–1.2)
Total Protein: 7 g/dL (ref 6.5–8.1)

## 2019-07-11 LAB — MAGNESIUM: Magnesium: 2.2 mg/dL (ref 1.7–2.4)

## 2019-07-11 LAB — PHOSPHORUS: Phosphorus: 3.1 mg/dL (ref 2.5–4.6)

## 2019-07-11 MED ORDER — SODIUM CHLORIDE 0.9 % IV SOLN
3.0000 mg/kg | Freq: Once | INTRAVENOUS | Status: AC
  Administered 2019-07-11: 220 mg via INTRAVENOUS
  Filled 2019-07-11: qty 22

## 2019-07-11 MED ORDER — ACETAMINOPHEN 325 MG PO TABS
ORAL_TABLET | ORAL | Status: AC
  Filled 2019-07-11: qty 2

## 2019-07-11 MED ORDER — DIPHENHYDRAMINE HCL 50 MG/ML IJ SOLN
INTRAMUSCULAR | Status: AC
  Filled 2019-07-11: qty 1

## 2019-07-11 MED ORDER — SODIUM CHLORIDE 0.9 % IV SOLN
Freq: Once | INTRAVENOUS | Status: AC
  Filled 2019-07-11: qty 250

## 2019-07-11 MED ORDER — SODIUM CHLORIDE 0.9 % IV SOLN
150.0000 mg | Freq: Once | INTRAVENOUS | Status: AC
  Administered 2019-07-11: 150 mg via INTRAVENOUS
  Filled 2019-07-11: qty 150

## 2019-07-11 MED ORDER — PEGFILGRASTIM 6 MG/0.6ML ~~LOC~~ PSKT
PREFILLED_SYRINGE | SUBCUTANEOUS | Status: AC
  Filled 2019-07-11: qty 0.6

## 2019-07-11 MED ORDER — ACETAMINOPHEN 325 MG PO TABS
650.0000 mg | ORAL_TABLET | Freq: Once | ORAL | Status: AC
  Administered 2019-07-11: 650 mg via ORAL

## 2019-07-11 MED ORDER — SODIUM CHLORIDE 0.9% FLUSH
10.0000 mL | INTRAVENOUS | Status: DC | PRN
  Administered 2019-07-11: 10 mL
  Filled 2019-07-11: qty 10

## 2019-07-11 MED ORDER — PALONOSETRON HCL INJECTION 0.25 MG/5ML
0.2500 mg | Freq: Once | INTRAVENOUS | Status: AC
  Administered 2019-07-11: 0.25 mg via INTRAVENOUS

## 2019-07-11 MED ORDER — DIPHENHYDRAMINE HCL 50 MG/ML IJ SOLN
25.0000 mg | Freq: Once | INTRAMUSCULAR | Status: AC
  Administered 2019-07-11: 25 mg via INTRAVENOUS

## 2019-07-11 MED ORDER — PALONOSETRON HCL INJECTION 0.25 MG/5ML
INTRAVENOUS | Status: AC
  Filled 2019-07-11: qty 5

## 2019-07-11 MED ORDER — FAMOTIDINE IN NACL 20-0.9 MG/50ML-% IV SOLN
20.0000 mg | Freq: Once | INTRAVENOUS | Status: AC
  Administered 2019-07-11: 20 mg via INTRAVENOUS

## 2019-07-11 MED ORDER — HEPARIN SOD (PORK) LOCK FLUSH 100 UNIT/ML IV SOLN
500.0000 [IU] | Freq: Once | INTRAVENOUS | Status: AC | PRN
  Administered 2019-07-11: 500 [IU]
  Filled 2019-07-11: qty 5

## 2019-07-11 MED ORDER — SODIUM CHLORIDE 0.9 % IV SOLN
10.0000 mg | Freq: Once | INTRAVENOUS | Status: AC
  Administered 2019-07-11: 10 mg via INTRAVENOUS
  Filled 2019-07-11: qty 10

## 2019-07-11 MED ORDER — PEGFILGRASTIM 6 MG/0.6ML ~~LOC~~ PSKT
6.0000 mg | PREFILLED_SYRINGE | Freq: Once | SUBCUTANEOUS | Status: AC
  Administered 2019-07-11: 6 mg via SUBCUTANEOUS

## 2019-07-11 MED ORDER — FAMOTIDINE IN NACL 20-0.9 MG/50ML-% IV SOLN
INTRAVENOUS | Status: AC
  Filled 2019-07-11: qty 50

## 2019-07-11 NOTE — Progress Notes (Signed)
Patient has declined to stay for post observation - feels well.

## 2019-07-16 ENCOUNTER — Inpatient Hospital Stay: Payer: 59

## 2019-07-16 ENCOUNTER — Other Ambulatory Visit: Payer: Self-pay | Admitting: Emergency Medicine

## 2019-07-16 ENCOUNTER — Telehealth: Payer: Self-pay | Admitting: *Deleted

## 2019-07-16 ENCOUNTER — Inpatient Hospital Stay (HOSPITAL_BASED_OUTPATIENT_CLINIC_OR_DEPARTMENT_OTHER): Payer: 59 | Admitting: Medical

## 2019-07-16 ENCOUNTER — Other Ambulatory Visit: Payer: Self-pay

## 2019-07-16 VITALS — BP 110/73 | HR 110 | Temp 97.5°F | Resp 18 | Ht 67.0 in | Wt 175.6 lb

## 2019-07-16 DIAGNOSIS — R220 Localized swelling, mass and lump, head: Secondary | ICD-10-CM

## 2019-07-16 DIAGNOSIS — C50919 Malignant neoplasm of unspecified site of unspecified female breast: Secondary | ICD-10-CM

## 2019-07-16 DIAGNOSIS — Z95828 Presence of other vascular implants and grafts: Secondary | ICD-10-CM

## 2019-07-16 DIAGNOSIS — Z171 Estrogen receptor negative status [ER-]: Secondary | ICD-10-CM

## 2019-07-16 DIAGNOSIS — R6884 Jaw pain: Secondary | ICD-10-CM

## 2019-07-16 DIAGNOSIS — C7951 Secondary malignant neoplasm of bone: Secondary | ICD-10-CM

## 2019-07-16 DIAGNOSIS — Z7189 Other specified counseling: Secondary | ICD-10-CM

## 2019-07-16 DIAGNOSIS — C50512 Malignant neoplasm of lower-outer quadrant of left female breast: Secondary | ICD-10-CM

## 2019-07-16 LAB — CBC WITH DIFFERENTIAL (CANCER CENTER ONLY)
Abs Immature Granulocytes: 0.74 10*3/uL — ABNORMAL HIGH (ref 0.00–0.07)
Basophils Absolute: 0 10*3/uL (ref 0.0–0.1)
Basophils Relative: 0 %
Eosinophils Absolute: 0.1 10*3/uL (ref 0.0–0.5)
Eosinophils Relative: 0 %
HCT: 31.6 % — ABNORMAL LOW (ref 36.0–46.0)
Hemoglobin: 10.3 g/dL — ABNORMAL LOW (ref 12.0–15.0)
Immature Granulocytes: 3 %
Lymphocytes Relative: 5 %
Lymphs Abs: 1.5 10*3/uL (ref 0.7–4.0)
MCH: 35 pg — ABNORMAL HIGH (ref 26.0–34.0)
MCHC: 32.6 g/dL (ref 30.0–36.0)
MCV: 107.5 fL — ABNORMAL HIGH (ref 80.0–100.0)
Monocytes Absolute: 2.5 10*3/uL — ABNORMAL HIGH (ref 0.1–1.0)
Monocytes Relative: 9 %
Neutro Abs: 23.3 10*3/uL — ABNORMAL HIGH (ref 1.7–7.7)
Neutrophils Relative %: 83 %
Platelet Count: 165 10*3/uL (ref 150–400)
RBC: 2.94 MIL/uL — ABNORMAL LOW (ref 3.87–5.11)
RDW: 17 % — ABNORMAL HIGH (ref 11.5–15.5)
WBC Count: 28.2 10*3/uL — ABNORMAL HIGH (ref 4.0–10.5)
nRBC: 0.1 % (ref 0.0–0.2)

## 2019-07-16 LAB — CMP (CANCER CENTER ONLY)
ALT: 23 U/L (ref 0–44)
AST: 23 U/L (ref 15–41)
Albumin: 3.2 g/dL — ABNORMAL LOW (ref 3.5–5.0)
Alkaline Phosphatase: 222 U/L — ABNORMAL HIGH (ref 38–126)
Anion gap: 10 (ref 5–15)
BUN: 11 mg/dL (ref 6–20)
CO2: 28 mmol/L (ref 22–32)
Calcium: 8.8 mg/dL — ABNORMAL LOW (ref 8.9–10.3)
Chloride: 102 mmol/L (ref 98–111)
Creatinine: 0.81 mg/dL (ref 0.44–1.00)
GFR, Est AFR Am: >60 mL/min (ref >60)
GFR, Estimated: >60 mL/min (ref >60)
Glucose, Bld: 121 mg/dL — ABNORMAL HIGH (ref 70–99)
Potassium: 3.5 mmol/L (ref 3.5–5.1)
Sodium: 140 mmol/L (ref 135–145)
Total Bilirubin: <0.2 mg/dL — ABNORMAL LOW (ref 0.3–1.2)
Total Protein: 7 g/dL (ref 6.5–8.1)

## 2019-07-16 LAB — MAGNESIUM: Magnesium: 2.1 mg/dL (ref 1.7–2.4)

## 2019-07-16 LAB — PHOSPHORUS: Phosphorus: 3.6 mg/dL (ref 2.5–4.6)

## 2019-07-16 MED ORDER — HEPARIN SOD (PORK) LOCK FLUSH 100 UNIT/ML IV SOLN
500.0000 [IU] | Freq: Once | INTRAVENOUS | Status: AC | PRN
  Administered 2019-07-16: 500 [IU]
  Filled 2019-07-16: qty 5

## 2019-07-16 MED ORDER — SODIUM CHLORIDE 0.9% FLUSH
10.0000 mL | INTRAVENOUS | Status: DC | PRN
  Administered 2019-07-16: 10 mL
  Filled 2019-07-16: qty 10

## 2019-07-16 NOTE — Patient Instructions (Signed)

## 2019-07-16 NOTE — Telephone Encounter (Signed)
Pt called with c/o increased pain and swelling to jaw at site of osteonecrosis. Per [name] pt to be seen in [facility]. Pt confirmed appt.

## 2019-07-18 ENCOUNTER — Other Ambulatory Visit: Payer: Self-pay | Admitting: Medical

## 2019-07-18 DIAGNOSIS — R509 Fever, unspecified: Secondary | ICD-10-CM

## 2019-07-18 MED ORDER — AMOXICILLIN-POT CLAVULANATE 875-125 MG PO TABS: 1.0000 | ORAL_TABLET | Freq: Two times a day (BID) | ORAL | 0 refills | Status: DC

## 2019-07-18 NOTE — Progress Notes (Signed)
[facility]pt[facility] Management Clinic Progress Note   Laura Anthony [id number] [birth year] 44 y.o.  Laura Anthony is managed by Dr. Nicholas Lose  Actively treated with chemotherapy/immunotherapy/hormonal therapy: yes  Current therapy: [name] with Onpro support  Last treated: 07/11/2019 (cycle #5, day #8)  Next scheduled appointment with provider: 07/25/2019  Assessment: Plan:    Port-A-Cath in place - Plan: heparin lock flush 100 unit/mL, sodium chloride flush (NS) 0.9 % injection 10 mL  Metastatic breast cancer ([place])  Bone metastases ([facility])  Left facial swelling   Metastatic breast cancer with bone metastasis: Patient continues to be managed by Dr. Nicholas Lose and is status post cycle 5, day 8 of Trodelvy with on prosupport.  This was dosed on 07/11/2019.  She will return for follow-up on 07/25/2019.  Left facial swelling: The patient was reassured that her left facial swelling could be due to a salivary stone.  She was told to massage the area several times daily and to use warm compresses to the area several times daily.  Please see After Visit Summary for patient specific instructions.  Future Appointments  Date Time Provider [place]  07/25/2019  7:45 AM [facility] LAB 3 [facility] None  07/25/2019  8:00 AM [facility] [place] None  07/25/2019  8:30 AM Nicholas Lose, MD [facility] None  07/25/2019  9:30 AM [facility] INFUSION [facility] None  08/01/2019  8:30 AM [facility] LAB 3 [facility] None  08/01/2019  8:45 AM [facility] [place] FLUSH [facility] None  08/01/2019  9:30 AM [facility] INFUSION [facility] None  08/06/2019  9:00 AM [facility] INJ 1 [facility] [place]  08/06/2019 10:00 AM [facility]-CT 2 [facility]-CT [place]  08/06/2019 12:00 PM [facility]-NM 1 [facility] [place]  08/12/2019  2:30 PM Hayden Pedro, PA-C [facility] None    No orders of the defined types were placed in this encounter.      Subjective:   Patient ID:  Laura Anthony  is a 44 y.o. (DOB [birth year]) female.  Chief Complaint:  Chief Complaint  Patient presents with  . Jaw Pain    HPI Laura Anthony is a 44 y.o. female with a diagnosis of a metastatic breast cancer with bone metastasis.  She is managed by Dr. Nicholas Lose and is status post cycle 5, day 8 of Trodelvy with on prosupport.  This was dosed on 07/11/2019.  She has a history of osteonecrosis of the left jaw.  She presents to the clinic today with some left mouth swelling and pain.  She reports that she had some bleeding from her gums and has had some mucositis which is improved.  She reports having intermittent diarrhea which is improved.  She denies fevers, chills, chest pain, shortness of breath, nausea, or vomiting.  Medications: I have reviewed the patient's current medications.  Allergies:  Allergies  Allergen Reactions  . Statins Other (See Comments)    Leg pain  . Sumatriptan Other (See Comments)    Numbness to face   . Tape Rash    Rash from dressing over port-a-cath     Past Medical History:  Diagnosis Date  . ADD (attention deficit disorder)   . Anemia   . Anxiety   . Breast cancer, left breast ([place])    S/P mastectomy 03/27/2017  . DVT (deep venous thrombosis) ([place]) 2017   calf left - probably due to [facility] pills-took eliquis x3 mos, nonthing now  . High cholesterol   . Impingement syndrome of right shoulder 07/2013  . Migraine    "  usually 1/month" (03/28/2017)  . PONV (postoperative nausea and vomiting)   . Right bicipital tenosynovitis 07/2013  . Rotator cuff impingement syndrome of right shoulder 07/12/2013  . Seizures ([place])    x 1 as a child - was never on anticonvulsants (03/28/2017)    Past Surgical History:  Procedure Laterality Date  . ADENOIDECTOMY  1981  . ANKLE ARTHROSCOPY Right   . BREAST BIOPSY Left 10/2016  . KNEE ARTHROSCOPY Right   . KNEE ARTHROSCOPY W/ ACL RECONSTRUCTION Left   . LIPOSUCTION WITH LIPOFILLING Bilateral 01/05/2018   Procedure:  LIPOFILLING FROM ABDOMEN TO BILATERAL CHEST;  Surgeon: Irene Limbo, MD;  Location: [place];  Service: Plastics;  Laterality: Bilateral;  . MASTECTOMY Left 03/27/2017   NIPPLE SPARING MASTECTOMY WITH RADIOACTIVE SEED TARGETED LYMPH NODE EXCISION AND LEFT AXILLARY SENTINEL LYMPH NODE BIOPSY  . MASTECTOMY Right 03/27/2017   RIGHT PROPHYLACTIC NIPPLE SPARING MASTECTOMY  . NIPPLE SPARING MASTECTOMY Right 03/27/2017   Procedure: RIGHT PROPHYLACTIC NIPPLE SPARING MASTECTOMY;  Surgeon: Rolm Bookbinder, MD;  Location: [place];  Service: General;  Laterality: Right;  . PORT-A-CATH REMOVAL Right 01/05/2018   Procedure: REMOVAL RIGHT CHEST PORT;  Surgeon: Irene Limbo, MD;  Location: [place];  Service: Plastics;  Laterality: Right;  . PORTACATH PLACEMENT N/A 11/01/2016   Procedure: INSERTION PORT-A-CATH WITH [place];  Surgeon: Rolm Bookbinder, MD;  Location: [place];  Service: General;  Laterality: N/A;  . PORTACATH PLACEMENT N/A 08/23/2018   Procedure: INSERTION PORT-A-CATH WITH ULTRASOUND;  Surgeon: Rolm Bookbinder, MD;  Location: [place];  Service: General;  Laterality: N/A;  . RADIOACTIVE SEED GUIDED AXILLARY SENTINEL LYMPH NODE Left 03/27/2017   Procedure: LEFT NIPPLE SPARING MASTECTOMY WITH RADIOACTIVE SEED TARGETED LYMPH NODE EXCISION AND LEFT AXILLARY SENTINEL LYMPH NODE BIOPSY;  Surgeon: Rolm Bookbinder, MD;  Location: [place];  Service: General;  Laterality: Left;  REQUESTS RNFA  . RECONSTRUCTION BREAST IMMEDIATE / DELAYED W/ TISSUE EXPANDER Bilateral 03/27/2017   BILATERAL BREAST RECONSTRUCTION WITH PLACEMENT OF TISSUE EXPANDER AND ALLODERM  . REMOVAL OF BILATERAL TISSUE EXPANDERS WITH PLACEMENT OF BILATERAL BREAST IMPLANTS Bilateral 01/05/2018   Procedure: REMOVAL OF BILATERAL TISSUE EXPANDERS WITH PLACEMENT OF BILATERAL BREAST IMPLANTS;  Surgeon: Irene Limbo, MD;  Location: [place];  Service: Plastics;  Laterality: Bilateral;  . SEPTOPLASTY WITH ETHMOIDECTOMY, AND MAXILLARY ANTROSTOMY  10/29/2010   bilat. max. antrostomy with left max. stripping; left ant. ethmoidectomy; right total ethmoidectomy; sphenoidotomy  . SHOULDER ARTHROSCOPY WITH SUBACROMIAL DECOMPRESSION AND BICEP TENDON REPAIR Right 07/12/2013   Procedure: RIGHT SHOULDER ARTHROSCOPY DEBRIDEMENT EXTENSIVE DECOMPRESSION SUBACROMIAL PARTIAL ACROMIOPLASTY;  Surgeon: Johnny Bridge, MD;  Location: [place];  Service: Orthopedics;  Laterality: Right;  . WRIST ARTHROSCOPY  01/17/2012   Procedure: ARTHROSCOPY WRIST; right wrist Surgeon: Tennis Must, MD;  Location: [place];  Service: Orthopedics;  Laterality: Right;  RIGHT WRIST ARTHROSCOPY WITH TRIANGULAR FIBROCARTILAGE COMPLEX REPAIR AND DEBRIDEMENT     Family History  Problem Relation Age of Onset  . Breast cancer Mother 74       triple negative  . Leukemia Father   . Lung cancer Father   . Heart attack Maternal Uncle   . Prostate cancer Paternal Uncle   . COPD Paternal Grandmother   . Heart disease Paternal Grandfather   . Prostate cancer Paternal Uncle   . Leukemia Cousin     Social History   Socioeconomic History  . Marital status: Married  Spouse name: Not on file  . Number of children: Not on file  . Years of education: Not on file  . Highest education level: Not on file  Occupational History  . Not on file  Tobacco Use  . Smoking status: Never Smoker  . Smokeless tobacco: Never Used  Vaping Use  . Vaping Use: Never used  Substance and Sexual Activity  . Alcohol use: Yes    Comment: Drinks very rare  . Drug use: No  . Sexual activity: Yes    Birth control/protection: Surgical    Comment: husband has had a vasectomy  Other Topics Concern  . Not on file  Social History Narrative  . Not on file   Social Determinants of Health   Financial Resource Strain:   . Difficulty of Paying Living  Expenses:   Food Insecurity:   . Worried About Charity fundraiser in the Last Year:   . Arboriculturist in the Last Year:   Transportation Needs:   . Film/video editor (Medical):   Marland Kitchen Lack of Transportation (Non-Medical):   Physical Activity:   . Days of Exercise per Week:   . Minutes of Exercise per Session:   Stress:   . Feeling of Stress :   Social Connections:   . Frequency of Communication with Friends and Family:   . Frequency of Social Gatherings with Friends and Family:   . Attends Religious Services:   . Active Member of Clubs or Organizations:   . Attends Archivist Meetings:   Marland Kitchen Marital Status:   Intimate Partner Violence:   . Fear of Current or Ex-Partner:   . Emotionally Abused:   Marland Kitchen Physically Abused:   . Sexually Abused:     Past Medical History, Surgical history, Social history, and Family history were reviewed and updated as appropriate.   Please see review of systems for further details on the patient's review from today.   Review of Systems:  Review of Systems  Constitutional: Negative for chills, diaphoresis and fever.  HENT: Positive for mouth sores. Negative for trouble swallowing and voice change.        Left facial swelling  Respiratory: Negative for cough, chest tightness, shortness of breath and wheezing.   Cardiovascular: Negative for chest pain and palpitations.  Gastrointestinal: Positive for diarrhea. Negative for abdominal pain, constipation, nausea and vomiting.  Musculoskeletal: Negative for back pain and myalgias.  Neurological: Negative for dizziness, light-headedness and headaches.    Objective:   Physical Exam:  BP 110/73 (BP Location: Left Arm, Patient Position: Sitting)   Pulse (!) 110   Temp (!) 97.5 F (36.4 C) (Temporal)   Resp 18   Ht 5\' 7"  (1.702 m)   Wt 175 lb 9.6 oz (79.7 kg)   SpO2 94%   BMI 27.50 kg/m  ECOG: 0  Physical Exam Constitutional:      General: She is not in acute distress.     Appearance: She is not diaphoretic.  HENT:     Head: Normocephalic and atraumatic.     Mouth/Throat:     Comments: Mild induration over the left salivary gland. Eyes:     General: No scleral icterus.       Right eye: No discharge.        Left eye: No discharge.     Conjunctiva/sclera: Conjunctivae normal.  Cardiovascular:     Rate and Rhythm: Normal rate and regular rhythm.     Heart sounds: Normal heart sounds.  No murmur heard.  No friction rub. No gallop.   Pulmonary:     Effort: Pulmonary effort is normal. No respiratory distress.     Breath sounds: Normal breath sounds. No wheezing or rales.  Musculoskeletal:     Cervical back: Normal range of motion and neck supple. No rigidity.  Lymphadenopathy:     Cervical: No cervical adenopathy.  Skin:    General: Skin is warm and dry.     Findings: No erythema or rash.  Neurological:     Mental Status: She is alert.     Coordination: Coordination normal.     Gait: Gait normal.  Psychiatric:        Mood and Affect: Mood normal.        Behavior: Behavior normal.        Thought Content: Thought content normal.        Judgment: Judgment normal.     Lab Review:     Component Value Date/Time   NA 140 07/16/2019 1235   NA 138 01/27/2017 0802   K 3.5 07/16/2019 1235   K 4.1 01/27/2017 0802   CL 102 07/16/2019 1235   CO2 28 07/16/2019 1235   CO2 26 01/27/2017 0802   GLUCOSE 121 (H) 07/16/2019 1235   GLUCOSE 90 01/27/2017 0802   BUN 11 07/16/2019 1235   BUN 14.0 01/27/2017 0802   CREATININE 0.81 07/16/2019 1235   CREATININE 0.7 01/27/2017 0802   CALCIUM 8.8 (L) 07/16/2019 1235   CALCIUM 8.6 01/27/2017 0802   PROT 7.0 07/16/2019 1235   PROT 6.5 01/27/2017 0802   ALBUMIN 3.2 (L) 07/16/2019 1235   ALBUMIN 3.5 01/27/2017 0802   AST 23 07/16/2019 1235   AST 22 01/27/2017 0802   ALT 23 07/16/2019 1235   ALT 23 01/27/2017 0802   ALKPHOS 222 (H) 07/16/2019 1235   ALKPHOS 85 01/27/2017 0802   BILITOT <0.2 (L) 07/16/2019 1235    BILITOT 0.23 01/27/2017 0802   GFRNONAA >60 07/16/2019 1235   GFRAA >60 07/16/2019 1235       Component Value Date/Time   WBC 28.2 (H) 07/16/2019 1235   WBC 11.0 (H) 01/04/2019 1649   RBC 2.94 (L) 07/16/2019 1235   HGB 10.3 (L) 07/16/2019 1235   HGB 11.1 (L) 01/27/2017 0802   HCT 31.6 (L) 07/16/2019 1235   HCT 33.4 (L) 01/27/2017 0802   PLT 165 07/16/2019 1235   PLT 170 01/27/2017 0802   MCV 107.5 (H) 07/16/2019 1235   MCV 99.0 01/27/2017 0802   MCH 35.0 (H) 07/16/2019 1235   MCHC 32.6 07/16/2019 1235   RDW 17.0 (H) 07/16/2019 1235   RDW 18.2 (H) 01/27/2017 0802   LYMPHSABS 1.5 07/16/2019 1235   LYMPHSABS 1.7 01/27/2017 0802   MONOABS 2.5 (H) 07/16/2019 1235   MONOABS 0.5 01/27/2017 0802   EOSABS 0.1 07/16/2019 1235   EOSABS 0.0 01/27/2017 0802   BASOSABS 0.0 07/16/2019 1235   BASOSABS 0.0 01/27/2017 0802   -------------------------------  Imaging from last 24 hours (if applicable):  Radiology interpretation: MR Brain W Wo Contrast  Result Date: 06/25/2019 CLINICAL DATA:  Breast cancer, follow-up EXAM: MRI HEAD WITHOUT AND WITH CONTRAST TECHNIQUE: Multiplanar, multiecho pulse sequences of the brain and surrounding structures were obtained without and with intravenous contrast. CONTRAST:  59mL MULTIHANCE GADOBENATE DIMEGLUMINE 529 MG/ML IV SOLN COMPARISON:  06/10/2019 FINDINGS: Brain: Stable 5 mm enhancing lesion involving the dorsal aspect of the left precentral gyrus near the hand motor region. No significant associated edema. No  new enhancing lesion. Small left parietal developmental venous anomaly is again incidentally noted. There is no acute infarction or intracranial hemorrhage. There is no hydrocephalus or extra-axial fluid collection. Ventricles and sulci are normal in size and configuration. Minimal small foci of T2 hyperintensity in the supratentorial white matter likely reflect nonspecific gliosis/demyelination of doubtful clinical significance Vascular: Major vessel  flow voids at the skull base are preserved. Skull and upper cervical spine: Normal marrow signal is preserved. Sinuses/Orbits: Trace mucosal thickening.  Orbits are unremarkable. Other: Sella is unremarkable.  Mastoid air cells are clear. IMPRESSION: Stable subcentimeter left frontal metastasis near the hand motor region. No new findings. Electronically Signed   By: Macy Mis M.D.   On: 06/25/2019 20:31

## 2019-07-19 ENCOUNTER — Other Ambulatory Visit: Payer: Self-pay

## 2019-07-19 ENCOUNTER — Inpatient Hospital Stay: Payer: 59

## 2019-07-19 ENCOUNTER — Encounter: Payer: Self-pay | Admitting: Adult Health

## 2019-07-19 ENCOUNTER — Inpatient Hospital Stay (HOSPITAL_BASED_OUTPATIENT_CLINIC_OR_DEPARTMENT_OTHER): Payer: 59 | Admitting: Adult Health

## 2019-07-19 VITALS — BP 109/80 | HR 118 | Temp 99.0°F | Resp 20 | Ht 67.0 in | Wt 174.6 lb

## 2019-07-19 DIAGNOSIS — C50512 Malignant neoplasm of lower-outer quadrant of left female breast: Secondary | ICD-10-CM

## 2019-07-19 DIAGNOSIS — R509 Fever, unspecified: Secondary | ICD-10-CM | POA: Diagnosis not present

## 2019-07-19 DIAGNOSIS — U071 COVID-19: Secondary | ICD-10-CM | POA: Diagnosis not present

## 2019-07-19 DIAGNOSIS — Z171 Estrogen receptor negative status [ER-]: Secondary | ICD-10-CM

## 2019-07-19 DIAGNOSIS — C7951 Secondary malignant neoplasm of bone: Secondary | ICD-10-CM | POA: Diagnosis not present

## 2019-07-19 DIAGNOSIS — M879 Osteonecrosis, unspecified: Secondary | ICD-10-CM

## 2019-07-19 DIAGNOSIS — Z95828 Presence of other vascular implants and grafts: Secondary | ICD-10-CM

## 2019-07-19 DIAGNOSIS — M8708 Idiopathic aseptic necrosis of bone, other site: Secondary | ICD-10-CM | POA: Diagnosis not present

## 2019-07-19 LAB — CBC WITH DIFFERENTIAL (CANCER CENTER ONLY)
Abs Immature Granulocytes: 0.15 10*3/uL — ABNORMAL HIGH (ref 0.00–0.07)
Basophils Absolute: 0.1 10*3/uL (ref 0.0–0.1)
Basophils Relative: 1 %
Eosinophils Absolute: 0.1 10*3/uL (ref 0.0–0.5)
Eosinophils Relative: 1 %
HCT: 33 % — ABNORMAL LOW (ref 36.0–46.0)
Hemoglobin: 10.5 g/dL — ABNORMAL LOW (ref 12.0–15.0)
Immature Granulocytes: 2 %
Lymphocytes Relative: 15 %
Lymphs Abs: 1.3 10*3/uL (ref 0.7–4.0)
MCH: 33.4 pg (ref 26.0–34.0)
MCHC: 31.8 g/dL (ref 30.0–36.0)
MCV: 105.1 fL — ABNORMAL HIGH (ref 80.0–100.0)
Monocytes Absolute: 1.2 10*3/uL — ABNORMAL HIGH (ref 0.1–1.0)
Monocytes Relative: 13 %
Neutro Abs: 6.3 10*3/uL (ref 1.7–7.7)
Neutrophils Relative %: 68 %
Platelet Count: 144 10*3/uL — ABNORMAL LOW (ref 150–400)
RBC: 3.14 MIL/uL — ABNORMAL LOW (ref 3.87–5.11)
RDW: 17.5 % — ABNORMAL HIGH (ref 11.5–15.5)
WBC Count: 9.1 10*3/uL (ref 4.0–10.5)
nRBC: 0.2 % (ref 0.0–0.2)

## 2019-07-19 LAB — CMP (CANCER CENTER ONLY)
ALT: 28 U/L (ref 0–44)
AST: 22 U/L (ref 15–41)
Albumin: 3.2 g/dL — ABNORMAL LOW (ref 3.5–5.0)
Alkaline Phosphatase: 224 U/L — ABNORMAL HIGH (ref 38–126)
Anion gap: 11 (ref 5–15)
BUN: 9 mg/dL (ref 6–20)
CO2: 28 mmol/L (ref 22–32)
Calcium: 9.3 mg/dL (ref 8.9–10.3)
Chloride: 101 mmol/L (ref 98–111)
Creatinine: 0.8 mg/dL (ref 0.44–1.00)
GFR, Est AFR Am: >60 mL/min (ref >60)
GFR, Estimated: >60 mL/min (ref >60)
Glucose, Bld: 88 mg/dL (ref 70–99)
Potassium: 4 mmol/L (ref 3.5–5.1)
Sodium: 140 mmol/L (ref 135–145)
Total Bilirubin: 0.3 mg/dL (ref 0.3–1.2)
Total Protein: 7.1 g/dL (ref 6.5–8.1)

## 2019-07-19 MED ORDER — SODIUM CHLORIDE 0.9% FLUSH
10.0000 mL | INTRAVENOUS | Status: DC | PRN
  Administered 2019-07-19: 10 mL
  Filled 2019-07-19: qty 10

## 2019-07-19 MED ORDER — HEPARIN SOD (PORK) LOCK FLUSH 100 UNIT/ML IV SOLN
500.0000 [IU] | Freq: Once | INTRAVENOUS | Status: AC | PRN
  Administered 2019-07-19: 500 [IU]
  Filled 2019-07-19: qty 5

## 2019-07-19 NOTE — Progress Notes (Signed)
[facility] Follow up:    Laura Noon, MD [place] [address]   DIAGNOSIS: Cancer Staging Malignant neoplasm of lower-outer quadrant of left breast of female, estrogen receptor negative ([place]) Staging form: Breast, AJCC 8th Edition - Clinical: Stage IIA (cT1c, cN1, cM0, G2, ER: Negative, PR: Negative, HER2: Positive) - Signed by Gardenia Phlegm, NP on 11/02/2016 - Pathologic: No Stage Recommended (ypT1b, pN0, cM0, G2, ER: Negative, PR: Negative, HER2: Positive) - Unsigned   SUMMARY OF ONCOLOGIC HISTORY: 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, estrogen receptor negative ([place])  10/20/2016 Mammogram   Mammogram and ultrasound of the left breast revealed 1.7 cm mass at 4:00 position, 6:30 position 5 x 4 x 4 mm mass, 6:00 position 5 cm nipple 7 x 6 x 11 mm, left axillary lymph node with thickened cortex, T1c N1 stage II a AJCC 8   10/24/2016 Initial Diagnosis   Left breast biopsy 6:30 position 3 cm from nipple: IDC grade 2, DCIS, ER 0%, PR 0%, Ki-67 15% HER-2 positive ratio 2.1; 4:00 position 3 cm from nipple: IDC grade 2, DCIS, ER 0%, PR 0%, Ki-67 35%, HER-2 positive ratio 2.02; left axillary lymph node biopsy positive   11/04/2016 - 02/17/2017 Neo-Adjuvant Chemotherapy   TCH Perjeta 6 cycles followed by Herceptin + Perjeta maintenance to be completed September 2019   11/30/2016 Genetic Testing   Negative genetic testing on the common hereditary cancer panel.  The Hereditary Gene Panel offered by [facility] includes sequencing and/or deletion duplication testing of the following 47 genes: APC, ATM, AXIN2, BARD1, BMPR1A, BRCA1, BRCA2, BRIP1, CDH1, CDK4, CDKN2A (p14ARF), CDKN2A (p16INK4a), CHEK2, CTNNA1, DICER1, EPCAM (Deletion/duplication testing only), GREM1 (promoter region deletion/duplication testing only), KIT, MEN1, MLH1, MSH2, MSH3, MSH6, MUTYH, NBN, NF1, NHTL1, PALB2, PDGFRA, PMS2, POLD1,  POLE, PTEN, RAD50, RAD51C, RAD51D, SDHB, SDHC, SDHD, SMAD4, SMARCA4. STK11, TP53, TSC1, TSC2, and VHL.  The following genes were evaluated for sequence changes only: SDHA and HOXB13 c.251G>A variant only. The report date is November 30, 2016.    03/27/2017 Surgery   Bilateral mastectomies: Left mastectomy: IDC grade 2 0.9 cm, nodes negative, right mastectomy benign, ER 0%, PR 0%, HER-2 positive ratio 2.6   05/08/2017 - 06/09/2017 Radiation Therapy   Adjuvant radiation therapy   10/23/2017 Miscellaneous   Neratinib discontinued after 4 weeks for severe diarrhea   07/25/2018 Relapse/Recurrence   MRI of right elbow showed bone lesion consistent with malignancy. PET scan showed bilateral pulmonary nodules and several lytic bone lesions compatible with metastatic disease. Brain MRI on 08/02/18 showed no evidence of metastatic disease.   08/02/2018 PET scan   Bilateral hypermetabolic lung nodules, LUL 1.3 cm with SUV 3.88, lingular nodule 1.4 cm SUV 3.7, central lingular nodule 1.2 cm SUV 9.76, right middle lobe nodule 1.5 cm SUV 9.9, lytic bone metastases inferior pubic ramus, sacrum, T12, right 11th rib.   08/08/2018 Procedure   Lung biopsy: metastatic carcinoma, HER-2 negative (0), ER/PR negative.   08/10/2018 -  Radiation Therapy   Palliative radiatio to the right humerus along the medial condyle   08/24/2018 - 09/05/2018 Radiation Therapy   Palliative radiation to the right 11th rib and right elbow   09/26/2018 - 12/04/2018 Chemotherapy   Carboplatin atezolizumab at [facility] with Dr. Janan Halter on [place] clinical trial stopped because of new T5 metastases (toxicities included myopathy required prednisone, immune mediated thyroiditis), right upper extremity DVT on apixaban  12/14/2018 - 04/10/2019 Chemotherapy   The patient had pegfilgrastim-bmez (ZIEXTENZO) injection 6 mg, 6 mg, Subcutaneous,  Once, 2 of 2 cycles Administration: 6 mg (03/02/2019), 6 mg (02/16/2019), 6 mg (03/16/2019), 6 mg  (03/29/2019) eriBULin mesylate (HALAVEN) 2.7 mg in sodium chloride 0.9 % 100 mL chemo infusion, 1.42 mg/m2 = 2.65 mg, Intravenous,  Once, 4 of 4 cycles Dose modification: 1.1 mg/m2 (original dose 1.4 mg/m2, Cycle 2, Reason: Dose not tolerated, Comment: neutropenic fever), 0.7 mg/m2 (original dose 1.4 mg/m2, Cycle 4, Reason: Provider Judgment), 0.7 mg/m2 (original dose 1.4 mg/m2, Cycle 4, Reason: Provider Judgment) Administration: 2.7 mg (12/14/2018), 2.7 mg (12/21/2018), 2 mg (01/03/2019), 2 mg (01/10/2019), 1.35 mg (02/15/2019), 1.35 mg (03/01/2019), 1.35 mg (03/15/2019), 1.35 mg (03/28/2019)  for chemotherapy treatment.    12/17/2018 -  Radiation Therapy   Palliative radiation to sternal, sacral & pelvic lesions and SRS for T3 and C7-T1 lesions.   04/11/2019 -  Chemotherapy   The patient had dexamethasone (DECADRON) 4 MG tablet, 1 of 1 cycle, Start date: 04/01/2019, End date: 06/10/2019 palonosetron (ALOXI) injection 0.25 mg, 0.25 mg, Intravenous,  Once, 5 of 7 cycles Administration: 0.25 mg (04/11/2019), 0.25 mg (04/17/2019), 0.25 mg (05/02/2019), 0.25 mg (05/09/2019), 0.25 mg (05/23/2019), 0.25 mg (05/29/2019), 0.25 mg (06/13/2019), 0.25 mg (06/20/2019), 0.25 mg (07/05/2019), 0.25 mg (07/11/2019) pegfilgrastim (NEULASTA) injection 6 mg, 6 mg, Subcutaneous, Once, 1 of 1 cycle Administration: 6 mg (04/18/2019) pegfilgrastim (NEULASTA ONPRO KIT) injection 6 mg, 6 mg, Subcutaneous, Once, 4 of 6 cycles Administration: 6 mg (05/29/2019), 6 mg (05/09/2019), 6 mg (06/20/2019) fosaprepitant (EMEND) 150 mg in sodium chloride 0.9 % 145 mL IVPB, 150 mg, Intravenous,  Once, 5 of 7 cycles Administration: 150 mg (04/11/2019), 150 mg (04/17/2019), 150 mg (05/02/2019), 150 mg (05/09/2019), 150 mg (05/23/2019), 150 mg (05/29/2019), 150 mg (06/13/2019), 150 mg (06/20/2019), 150 mg (07/05/2019), 150 mg (07/11/2019) sacituzumab govitecan-hziy (TRODELVY) 590 mg in sodium chloride 0.9 % 250 mL (1.9094 mg/mL) chemo infusion, 8 mg/kg = 590 mg (100 % of  original dose 8 mg/kg), Intravenous,  Once, 5 of 7 cycles Dose modification: 8 mg/kg (original dose 8 mg/kg, Cycle 1, Reason: Provider Judgment), 4 mg/kg (original dose 4 mg/kg, Cycle 2, Reason: Other (see comments), Comment: split bags), 3 mg/kg (original dose 4 mg/kg, Cycle 2, Reason: Provider Judgment), 4 mg/kg (original dose 4 mg/kg, Cycle 2, Reason: Other (see comments), Comment: split bags) Administration: 590 mg (04/11/2019), 590 mg (04/17/2019), 590 mg (05/02/2019), 220 mg (05/09/2019), 220 mg (05/23/2019), 220 mg (05/29/2019), 220 mg (06/13/2019), 220 mg (06/20/2019), 220 mg (05/09/2019), 220 mg (05/23/2019), 220 mg (05/29/2019), 220 mg (06/13/2019), 220 mg (06/20/2019), 220 mg (07/05/2019), 220 mg (07/05/2019), 220 mg (07/11/2019), 220 mg (07/11/2019)  for chemotherapy treatment.    Bone metastases ([place])  08/07/2018 Initial Diagnosis   Bone metastases ([place])   12/14/2018 - 04/24/2019 Chemotherapy   The patient had pertuzumab (PERJETA) 420 mg in sodium chloride 0.9 % 250 mL chemo infusion, 420 mg (100 % of original dose 420 mg), Intravenous, Once, 5 of 6 cycles Dose modification: 420 mg (original dose 420 mg, Cycle 1, Reason: Provider Judgment) Administration: 420 mg (12/14/2018), 420 mg (01/10/2019), 420 mg (03/28/2019), 420 mg (01/30/2019), 420 mg (03/01/2019) trastuzumab-dkst (OGIVRI) 600 mg in sodium chloride 0.9 % 250 mL chemo infusion, 609 mg, Intravenous,  Once, 5 of 6 cycles Administration: 600 mg (12/14/2018), 450 mg (01/10/2019), 450 mg (03/28/2019), 450 mg (01/30/2019), 450 mg (03/01/2019)  for chemotherapy treatment.    12/17/2018 -  Radiation Therapy  Palliative radiation to sternal, sacral & pelvic lesions and SRS for T3 and C7-T1 lesions.     CURRENT THERAPY: Laura Anthony  INTERVAL HISTORY: Laura Anthony 44 y.o. female returns for an urgent evaluation of her mouth.  She developed a mild facial pain a couple of weeks ago.  2-3 days ago, it increased significantly, and last night a piece of  bone from her mouth came out.  She has had some associated facial swelling, and had a temperature last night of 101.  She called Laura Lei, our symptom management PA and he sent in Augmentin.  She took a dose last night and a dose this morning.  She is having pain in her mouth.  It is a 6/10.  She was unsure what to do for pain,s o she hasn't taken any medication for the pain.  She is taking methadone 26m PO BID.  She has percocet but is not taking it.    She was receiving Denosumab every 4 weeks, however stopped this treatment in 03/2019 when she developed osteonecrosis of the jaw.    Patient Active Problem List   Diagnosis Date Noted  . Palliative care patient 01/06/2019  . Inadequate pain control 01/02/2019  . Acute embolism and thrombosis of unspecified deep veins of unspecified lower extremity ([place] 12/26/2018  . Neutropenic fever ([place] 12/26/2018  . Sorethroat 12/26/2018  . Stenosis of brachiocephalic vein 133/29/5188 . 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 Neck pain 12/09/2018  . Hypothyroidism 12/09/2018  . Goals of care, counseling/discussion 12/06/2018  . Metastatic breast cancer ([facility] 08/08/2018  . Bone metastases ([place] 08/07/2018  . Pain from bone metastases ([facility] 08/07/2018  . Family history of breast cancer 04/02/2018  . Family history of prostate cancer 04/02/2018  . History of therapeutic radiation 10/16/2017  . Acquired absence of both breasts 04/03/2017  . Breast cancer, stage 2, left ([facility] [place] 03/27/2017  . Port-A-Cath in place 12/16/2016  . Genetic testing 12/06/2016  . Encounter for antineoplastic chemotherapy 11/25/2016  . Malignant neoplasm of lower-outer quadrant of left breast of female, estrogen receptor negative ([place] 10/27/2016  . Breast lump on left side at 1 o'clock position 05/22/2015  . Migraine without aura or status migrainosus 05/29/2014  . Hyperlipidemia LDL goal <100 10/22/2013  . Depression 09/09/2013  . Rotator cuff  impingement syndrome of left shoulder 07/12/2013  . ADD (attention deficit disorder) 01/09/2013  . Anxiety 12/13/2012  . Insomnia 12/13/2012    is allergic to statins, sumatriptan, and tape.  MEDICAL HISTORY: Past Medical History:  Diagnosis Date  . ADD (attention deficit disorder)   . Anemia   . Anxiety   . Breast cancer, left breast ([facility] [place]    S/P mastectomy 03/27/2017  . DVT (deep venous thrombosis) (HSac 2017   calf left - [facility]-took eliquis x3 mos, nonthing now  . High cholesterol   . Impingement syndrome of right shoulder 07/2013  . Migraine    "usually 1/month" (03/28/2017)  . PONV (postoperative nausea and vomiting)   . Right bicipital tenosynovitis 07/2013  . Rotator cuff impingement syndrome of right shoulder 07/12/2013  . Seizures ([facility]    x 1 as a child - was never on anticonvulsants (03/28/2017)    SURGICAL HISTORY: Past Surgical History:  Procedure Laterality Date  . ADENOIDECTOMY  1981  . ANKLE ARTHROSCOPY Right   . BREAST BIOPSY Left 10/2016  . KNEE ARTHROSCOPY Right   . KNEE ARTHROSCOPY W/ ACL RECONSTRUCTION Left   .  LIPOSUCTION WITH LIPOFILLING Bilateral 01/05/2018   Procedure: LIPOFILLING FROM ABDOMEN TO BILATERAL CHEST;  Surgeon: Irene Limbo, MD;  Location: [facility];  Service: Plastics;  Laterality: Bilateral;  . MASTECTOMY Left 03/27/2017   NIPPLE SPARING MASTECTOMY WITH RADIOACTIVE SEED TARGETED LYMPH NODE EXCISION AND LEFT AXILLARY SENTINEL LYMPH NODE BIOPSY  . MASTECTOMY Right 03/27/2017   RIGHT PROPHYLACTIC NIPPLE SPARING MASTECTOMY  . NIPPLE SPARING MASTECTOMY Right 03/27/2017   Procedure: RIGHT PROPHYLACTIC NIPPLE SPARING MASTECTOMY;  Surgeon: Rolm Bookbinder, MD;  Location: [place];  Service: General;  Laterality: Right;  . PORT-A-CATH REMOVAL Right 01/05/2018   Procedure: REMOVAL RIGHT CHEST PORT;  Surgeon: Irene Limbo, MD;  Location: [place];  Service: Plastics;   Laterality: Right;  . PORTACATH PLACEMENT N/A 11/01/2016   Procedure: INSERTION PORT-A-CATH WITH [place];  Surgeon: Rolm Bookbinder, MD;  Location: [facility];  Service: General;  Laterality: N/A;  . PORTACATH PLACEMENT N/A 08/23/2018   Procedure: INSERTION PORT-A-CATH WITH ULTRASOUND;  Surgeon: Rolm Bookbinder, MD;  Location: [facility];  Service: General;  Laterality: N/A;  . RADIOACTIVE SEED GUIDED AXILLARY SENTINEL LYMPH NODE Left 03/27/2017   Procedure: LEFT NIPPLE SPARING MASTECTOMY WITH RADIOACTIVE SEED TARGETED LYMPH NODE EXCISION AND LEFT AXILLARY SENTINEL LYMPH NODE BIOPSY;  Surgeon: Rolm Bookbinder, MD;  Location: [place];  Service: General;  Laterality: Left;  REQUESTS RNFA  . RECONSTRUCTION BREAST IMMEDIATE / DELAYED W/ TISSUE EXPANDER Bilateral 03/27/2017   BILATERAL BREAST RECONSTRUCTION WITH PLACEMENT OF TISSUE EXPANDER AND ALLODERM  . REMOVAL OF BILATERAL TISSUE EXPANDERS WITH PLACEMENT OF BILATERAL BREAST IMPLANTS Bilateral 01/05/2018   Procedure: REMOVAL OF BILATERAL TISSUE EXPANDERS WITH PLACEMENT OF BILATERAL BREAST IMPLANTS;  Surgeon: Irene Limbo, MD;  Location: [facility] [place];  Service: Plastics;  Laterality: Bilateral;  . SEPTOPLASTY WITH ETHMOIDECTOMY, AND MAXILLARY ANTROSTOMY  10/29/2010   bilat. max. antrostomy with left max. stripping; left ant. ethmoidectomy; right total ethmoidectomy; sphenoidotomy  . SHOULDER ARTHROSCOPY WITH SUBACROMIAL DECOMPRESSION AND BICEP TENDON REPAIR Right 07/12/2013   Procedure: RIGHT SHOULDER ARTHROSCOPY DEBRIDEMENT EXTENSIVE DECOMPRESSION SUBACROMIAL PARTIAL ACROMIOPLASTY;  Surgeon: Johnny Bridge, MD;  Location: [facility];  Service: Orthopedics;  Laterality: Right;  . WRIST ARTHROSCOPY  01/17/2012   Procedure: ARTHROSCOPY WRIST; right wrist Surgeon: Tennis Must, MD;  Location: [place];  Service: Orthopedics;  Laterality: Right;  RIGHT WRIST ARTHROSCOPY WITH  TRIANGULAR FIBROCARTILAGE COMPLEX REPAIR AND DEBRIDEMENT     SOCIAL HISTORY: Social History   Socioeconomic History  . Marital status: Married    Spouse name: Not on file  . Number of children: Not on file  . Years of education: Not on file  . Highest education level: Not on file  Occupational History  . Not on file  Tobacco Use  . Smoking status: Never Smoker  . Smokeless tobacco: Never Used  Vaping Use  . Vaping Use: Never used  Substance and Sexual Activity  . Alcohol use: Yes    Comment: Drinks very rare  . Drug use: No  . Sexual activity: Yes    Birth control/protection: Surgical    Comment: husband has had a vasectomy  Other Topics Concern  . Not on file  Social History Narrative  . Not on file   Social Determinants of Health   Financial Resource Strain:   . Difficulty of Paying Living Expenses:   Food Insecurity:   . Worried About Charity fundraiser in the Last Year:   .  Ran Out of Food in the Last Year:   Transportation Needs:   . Film/video editor (Medical):   Marland Kitchen Lack of Transportation (Non-Medical):   Physical Activity:   . Days of Exercise per Week:   . Minutes of Exercise per Session:   Stress:   . Feeling of Stress :   Social Connections:   . Frequency of Communication with Friends and Family:   . Frequency of Social Gatherings with Friends and Family:   . Attends Religious Services:   . Active Member of Clubs or Organizations:   . Attends Archivist Meetings:   Marland Kitchen Marital Status:   Intimate Partner Violence:   . Fear of Current or Ex-Partner:   . Emotionally Abused:   Marland Kitchen Physically Abused:   . Sexually Abused:     FAMILY HISTORY: Family History  Problem Relation Age of Onset  . Breast cancer Mother 60       triple negative  . Leukemia Father   . Lung cancer Father   . Heart attack Maternal Uncle   . Prostate cancer Paternal Uncle   . COPD Paternal Grandmother   . Heart disease Paternal Grandfather   . Prostate cancer  Paternal Uncle   . Leukemia Cousin     Review of Systems  Constitutional: Positive for fatigue and fever. Negative for appetite change, chills and unexpected weight change.  HENT:   Negative for hearing loss, lump/mass, sore throat, trouble swallowing and voice change.   Eyes: Negative for eye problems and icterus.  Respiratory: Negative for chest tightness and cough.   Cardiovascular: Negative for chest pain and leg swelling.  Gastrointestinal: Negative for abdominal distention and abdominal pain.  Endocrine: Negative for hot flashes.  Genitourinary: Negative for difficulty urinating.   Musculoskeletal: Negative for arthralgias.  Neurological: Negative for dizziness, extremity weakness, headaches and numbness.  Hematological: Negative for adenopathy. Does not bruise/bleed easily.  Psychiatric/Behavioral: Negative for depression. The patient is not nervous/anxious.       PHYSICAL EXAMINATION  ECOG PERFORMANCE STATUS: 1 - Symptomatic but completely ambulatory  Vitals:   07/19/19 0818  BP: 109/80  Pulse: (!) 118  Resp: 20  Temp: 99 F (37.2 C)  SpO2: 97%    Physical Exam Constitutional:      General: She is not in acute distress.    Appearance: She is normal weight. She is not toxic-appearing.  HENT:     Head: Normocephalic and atraumatic.     Mouth/Throat:     Mouth: Mucous membranes are moist.     Comments: Left Posterior inner gum line with swelling and exposed bone noted, on the left lower outer gum line and mandible a there is swelling and tenderness noted.  Swelling noted along maxilla too, with slight tenderness Eyes:     General: No scleral icterus. Cardiovascular:     Rate and Rhythm: Normal rate and regular rhythm.     Pulses: Normal pulses.     Heart sounds: Normal heart sounds.  Pulmonary:     Effort: Pulmonary effort is normal.     Breath sounds: Normal breath sounds.  Abdominal:     General: Abdomen is flat. Bowel sounds are normal.     Palpations:  Abdomen is soft.  Musculoskeletal:        General: Normal range of motion.     Cervical back: Neck supple.  Lymphadenopathy:     Cervical: No cervical adenopathy.  Skin:    General: Skin is warm and dry.  Capillary Refill: Capillary refill takes less than 2 seconds.     Findings: No lesion.  Neurological:     General: No focal deficit present.     Mental Status: She is alert.  Psychiatric:        Mood and Affect: Mood normal.        Behavior: Behavior normal.     LABORATORY DATA:  CBC    Component Value Date/Time   WBC 9.1 07/19/2019 0814   WBC 11.0 (H) 01/04/2019 1649   RBC 3.14 (L) 07/19/2019 0814   HGB 10.5 (L) 07/19/2019 0814   HGB 11.1 (L) 01/27/2017 0802   HCT 33.0 (L) 07/19/2019 0814   HCT 33.4 (L) 01/27/2017 0802   PLT 144 (L) 07/19/2019 0814   PLT 170 01/27/2017 0802   MCV 105.1 (H) 07/19/2019 0814   MCV 99.0 01/27/2017 0802   MCH 33.4 07/19/2019 0814   MCHC 31.8 07/19/2019 0814   RDW 17.5 (H) 07/19/2019 0814   RDW 18.2 (H) 01/27/2017 0802   LYMPHSABS 1.3 07/19/2019 0814   LYMPHSABS 1.7 01/27/2017 0802   MONOABS 1.2 (H) 07/19/2019 0814   MONOABS 0.5 01/27/2017 0802   EOSABS 0.1 07/19/2019 0814   EOSABS 0.0 01/27/2017 0802   BASOSABS 0.1 07/19/2019 0814   BASOSABS 0.0 01/27/2017 0802    CMP     Component Value Date/Time   NA 140 07/19/2019 0840   NA 138 01/27/2017 0802   K 4.0 07/19/2019 0840   K 4.1 01/27/2017 0802   CL 101 07/19/2019 0840   CO2 28 07/19/2019 0840   CO2 26 01/27/2017 0802   GLUCOSE 88 07/19/2019 0840   GLUCOSE 90 01/27/2017 0802   BUN 9 07/19/2019 0840   BUN 14.0 01/27/2017 0802   CREATININE 0.80 07/19/2019 0840   CREATININE 0.7 01/27/2017 0802   CALCIUM 9.3 07/19/2019 0840   CALCIUM 8.6 01/27/2017 0802   PROT 7.1 07/19/2019 0840   PROT 6.5 01/27/2017 0802   ALBUMIN 3.2 (L) 07/19/2019 0840   ALBUMIN 3.5 01/27/2017 0802   AST 22 07/19/2019 0840   AST 22 01/27/2017 0802   ALT 28 07/19/2019 0840   ALT 23 01/27/2017  0802   ALKPHOS 224 (H) 07/19/2019 0840   ALKPHOS 85 01/27/2017 0802   BILITOT 0.3 07/19/2019 0840   BILITOT 0.23 01/27/2017 0802   GFRNONAA >60 07/19/2019 0840   GFRAA >60 07/19/2019 0840         ASSESSMENT and PLAN:   Malignant neoplasm of lower-outer quadrant of left breast of female, estrogen receptor negative ([place]) 10/24/2016: Left breast biopsy 6:30 position 3 cm from nipple: IDC grade 2, DCIS, ER 0%, PR 0%, Ki-67 15% HER-2 positive ratio 2.1; 4:00 position 3 cm from nipple: IDC grade 2, DCIS, ER 0%, PR 0%, Ki-67 35%, HER-2 positive ratio 2.02 Lymph node biopsy positive  Treatment Summary: 1. Neoadjuvant chemotherapy with TCHPcompleted 02/17/2017 this would be followed by Herceptinand Perjetamaintenance for 1 yearcompleted September 2019 2.Bilateral mastectomies 03/28/2016:Bilateral mastectomies: Left mastectomy: IDC grade 2 0.9 cm, nodes negative, right mastectomy benign, ER 0%, PR 0%, HER-2 positive ratio 2.6 3.Adjuvant radiation4/08/2017 to 06/09/2017 4.Neratinib started 10/12/2017 discontinued due to diarrhea 5. Elbow fracture: Due to metastatic disease, palliative radiation therapy 6. Carboplatin atezolizumab at  [facility] on a clinical trial [facility] 043 stopped for progression 12/04/2018 ---------------------------------------------------------------------------------------------------------------- Lung nodule biopsy: Metastatic breast cancer triple negative Current treatment: Sacituzumab-Govitecan days 1 and 8 every 3 weeks with Neulasta on day 9 started 04/11/2019, today is cycle 4 day  15 of treatment.  Laura Anthony met with myself and Dr. Jana Hakim about her oral concerns.  She is tolerating the Augmentin well and we recommended that she continue this, and go see her dentist today.  She will return in 1 week for labs, f/u, and her next treatment.     No orders of the defined types were placed in this encounter.   All questions were answered. The patient knows to  call the clinic with any problems, questions or concerns. We can certainly see the patient much sooner if necessary.  Total encounter time: 30 minutes*  Wilber Bihari, NP 07/20/19 10:58 PM Medical Oncology and Hematology [facility] [place], [place] [address] Tel. [phone]    Fax. [phone]   ADDENDUM: Laura Anthony had been on Xgeva/denosumab until 03/28/2019.  She has osteonecrosis of the jaw and today brought as a piece of extruded bone.  She has some mouth discomfort.  She has been started on Augmentin.  She is already scheduled to meet with her dentist.  We discussed the physiology of osteonecrosis of the jaw.  She understands that the bone plaques in the jaw as opposed to for example the maxilla are quite thick and there is very minimal marrow which is what feeds the bone in between the 2 plaques.  Accordingly it is easier to have vascular problems in the jaw and this results in part of the bone dying and later being extruded.  In most cases we stop denosumab or zoledronate or similar drugs when osteonecrosis of the jaw develops, but it is not clear that stopping it really makes any difference once the process has started.  We generally discourage aggressive therapy such as debridement.  This is usually treated symptomatically and she is already on antibiotics.  She has a follow-up appointment with Dr. Ladona Mow for 07/25/2019 to discuss continuing/future treatment plans.  I personally saw this patient and performed a substantive portion of this encounter with the listed APP documented above.   Chauncey Cruel, MD Medical Oncology and Hematology [facility] [address] [place], [place] [address] Tel. [phone]    Fax. [phone]   *Total Encounter Time as defined by the Centers for Medicare and Medicaid Services includes, in addition to the face-to-face time of a patient visit (documented in the note above) non-face-to-face time:  obtaining and reviewing outside history, ordering and reviewing medications, tests or procedures, care coordination (communications with other health care professionals or caregivers) and documentation in the medical record.

## 2019-07-20 NOTE — Assessment & Plan Note (Signed)
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 Neoadjuvant chemotherapy with TCHPcompleted 02/17/2017 this would be followed by Herceptinand Perjetamaintenance for 1 yearcompleted September 2019 2.Bilateral mastectomies 03/28/2016:Bilateral mastectomies: Left mastectomy: IDC grade 2 0.9 cm, nodes negative, right mastectomy benign, ER 0%, PR 0%, HER-2 positive ratio 2.6 3.Adjuvant radiation4/08/2017 to 06/09/2017 4.Neratinib started 10/12/2017 discontinued due to diarrhea 5. Elbow fracture: Due to metastatic disease, palliative radiation therapy 6. 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  Laura Anthony met with myself and Dr. Jana Hakim about her oral concerns.  She is tolerating the Augmentin well and we recommended that she continue this, and go see her dentist today.  She will return in 1 week for labs, f/u, and her next treatment.

## 2019-07-21 DIAGNOSIS — M879 Osteonecrosis, unspecified: Secondary | ICD-10-CM | POA: Insufficient documentation

## 2019-07-22 ENCOUNTER — Inpatient Hospital Stay (HOSPITAL_COMMUNITY)
Admission: RE | Admit: 2019-07-22 | Discharge: 2019-07-25 | DRG: 177 | Disposition: A | Discharge status: Home Health | Payer: 59 | Attending: Family Medicine | Admitting: Family Medicine

## 2019-07-22 ENCOUNTER — Telehealth: Payer: Self-pay | Admitting: Adult Health

## 2019-07-22 ENCOUNTER — Inpatient Hospital Stay (HOSPITAL_COMMUNITY): Payer: 59

## 2019-07-22 ENCOUNTER — Other Ambulatory Visit: Payer: Self-pay | Admitting: Hematology and Oncology

## 2019-07-22 ENCOUNTER — Other Ambulatory Visit: Payer: Self-pay

## 2019-07-22 ENCOUNTER — Emergency Department (HOSPITAL_COMMUNITY): Payer: 59

## 2019-07-22 ENCOUNTER — Telehealth: Payer: Self-pay | Admitting: *Deleted

## 2019-07-22 ENCOUNTER — Encounter (HOSPITAL_COMMUNITY): Payer: Self-pay | Admitting: Emergency Medicine

## 2019-07-22 DIAGNOSIS — U071 COVID-19: Secondary | ICD-10-CM | POA: Diagnosis present

## 2019-07-22 DIAGNOSIS — Z79890 Hormone replacement therapy (postmenopausal): Secondary | ICD-10-CM

## 2019-07-22 DIAGNOSIS — E876 Hypokalemia: Secondary | ICD-10-CM | POA: Diagnosis present

## 2019-07-22 DIAGNOSIS — C7951 Secondary malignant neoplasm of bone: Secondary | ICD-10-CM | POA: Diagnosis present

## 2019-07-22 DIAGNOSIS — Z923 Personal history of irradiation: Secondary | ICD-10-CM

## 2019-07-22 DIAGNOSIS — Z79899 Other long term (current) drug therapy: Secondary | ICD-10-CM

## 2019-07-22 DIAGNOSIS — G893 Neoplasm related pain (acute) (chronic): Secondary | ICD-10-CM

## 2019-07-22 DIAGNOSIS — F32A Depression, unspecified: Secondary | ICD-10-CM | POA: Diagnosis present

## 2019-07-22 DIAGNOSIS — C50919 Malignant neoplasm of unspecified site of unspecified female breast: Secondary | ICD-10-CM | POA: Diagnosis present

## 2019-07-22 DIAGNOSIS — R739 Hyperglycemia, unspecified: Secondary | ICD-10-CM | POA: Diagnosis present

## 2019-07-22 DIAGNOSIS — Z7289 Other problems related to lifestyle: Secondary | ICD-10-CM

## 2019-07-22 DIAGNOSIS — Z9221 Personal history of antineoplastic chemotherapy: Secondary | ICD-10-CM

## 2019-07-22 DIAGNOSIS — E039 Hypothyroidism, unspecified: Secondary | ICD-10-CM | POA: Diagnosis present

## 2019-07-22 DIAGNOSIS — D6959 Other secondary thrombocytopenia: Secondary | ICD-10-CM | POA: Diagnosis present

## 2019-07-22 DIAGNOSIS — F419 Anxiety disorder, unspecified: Secondary | ICD-10-CM | POA: Diagnosis present

## 2019-07-22 DIAGNOSIS — Z171 Estrogen receptor negative status [ER-]: Secondary | ICD-10-CM

## 2019-07-22 DIAGNOSIS — M8708 Idiopathic aseptic necrosis of bone, other site: Secondary | ICD-10-CM | POA: Diagnosis not present

## 2019-07-22 DIAGNOSIS — D6481 Anemia due to antineoplastic chemotherapy: Secondary | ICD-10-CM | POA: Diagnosis present

## 2019-07-22 DIAGNOSIS — D7589 Other specified diseases of blood and blood-forming organs: Secondary | ICD-10-CM | POA: Diagnosis present

## 2019-07-22 DIAGNOSIS — Z91048 Other nonmedicinal substance allergy status: Secondary | ICD-10-CM | POA: Diagnosis not present

## 2019-07-22 DIAGNOSIS — Z20822 Contact with and (suspected) exposure to covid-19: Secondary | ICD-10-CM

## 2019-07-22 DIAGNOSIS — E663 Overweight: Secondary | ICD-10-CM | POA: Diagnosis present

## 2019-07-22 DIAGNOSIS — Z7901 Long term (current) use of anticoagulants: Secondary | ICD-10-CM

## 2019-07-22 DIAGNOSIS — Z6827 Body mass index (BMI) 27.0-27.9, adult: Secondary | ICD-10-CM

## 2019-07-22 DIAGNOSIS — Z803 Family history of malignant neoplasm of breast: Secondary | ICD-10-CM

## 2019-07-22 DIAGNOSIS — M879 Osteonecrosis, unspecified: Secondary | ICD-10-CM | POA: Diagnosis present

## 2019-07-22 DIAGNOSIS — G4700 Insomnia, unspecified: Secondary | ICD-10-CM | POA: Diagnosis present

## 2019-07-22 DIAGNOSIS — C7801 Secondary malignant neoplasm of right lung: Secondary | ICD-10-CM | POA: Diagnosis present

## 2019-07-22 DIAGNOSIS — G894 Chronic pain syndrome: Secondary | ICD-10-CM | POA: Diagnosis present

## 2019-07-22 DIAGNOSIS — Z9689 Presence of other specified functional implants: Secondary | ICD-10-CM | POA: Diagnosis present

## 2019-07-22 DIAGNOSIS — J1282 Pneumonia due to coronavirus disease 2019: Secondary | ICD-10-CM | POA: Diagnosis present

## 2019-07-22 DIAGNOSIS — T451X5A Adverse effect of antineoplastic and immunosuppressive drugs, initial encounter: Secondary | ICD-10-CM | POA: Diagnosis present

## 2019-07-22 DIAGNOSIS — R0902 Hypoxemia: Secondary | ICD-10-CM | POA: Diagnosis present

## 2019-07-22 DIAGNOSIS — Z86718 Personal history of other venous thrombosis and embolism: Secondary | ICD-10-CM

## 2019-07-22 DIAGNOSIS — F1190 Opioid use, unspecified, uncomplicated: Secondary | ICD-10-CM | POA: Diagnosis present

## 2019-07-22 DIAGNOSIS — Z9013 Acquired absence of bilateral breasts and nipples: Secondary | ICD-10-CM

## 2019-07-22 DIAGNOSIS — Z888 Allergy status to other drugs, medicaments and biological substances status: Secondary | ICD-10-CM

## 2019-07-22 DIAGNOSIS — C7931 Secondary malignant neoplasm of brain: Secondary | ICD-10-CM | POA: Diagnosis present

## 2019-07-22 DIAGNOSIS — R197 Diarrhea, unspecified: Secondary | ICD-10-CM | POA: Diagnosis present

## 2019-07-22 DIAGNOSIS — E785 Hyperlipidemia, unspecified: Secondary | ICD-10-CM | POA: Diagnosis present

## 2019-07-22 DIAGNOSIS — R509 Fever, unspecified: Secondary | ICD-10-CM

## 2019-07-22 DIAGNOSIS — F329 Major depressive disorder, single episode, unspecified: Secondary | ICD-10-CM | POA: Diagnosis present

## 2019-07-22 DIAGNOSIS — C7802 Secondary malignant neoplasm of left lung: Secondary | ICD-10-CM | POA: Diagnosis present

## 2019-07-22 LAB — COMPREHENSIVE METABOLIC PANEL
ALT: 21 U/L (ref 0–44)
AST: 19 U/L (ref 15–41)
Albumin: 3.3 g/dL — ABNORMAL LOW (ref 3.5–5.0)
Alkaline Phosphatase: 129 U/L — ABNORMAL HIGH (ref 38–126)
Anion gap: 8 (ref 5–15)
BUN: 12 mg/dL (ref 6–20)
CO2: 23 mmol/L (ref 22–32)
Calcium: 7.6 mg/dL — ABNORMAL LOW (ref 8.9–10.3)
Chloride: 107 mmol/L (ref 98–111)
Creatinine, Ser: 0.68 mg/dL (ref 0.44–1.00)
GFR calc Af Amer: >60 mL/min (ref >60)
GFR calc non Af Amer: >60 mL/min (ref >60)
Glucose, Bld: 129 mg/dL — ABNORMAL HIGH (ref 70–99)
Potassium: 2.9 mmol/L — ABNORMAL LOW (ref 3.5–5.1)
Sodium: 138 mmol/L (ref 135–145)
Total Bilirubin: 0.5 mg/dL (ref 0.3–1.2)
Total Protein: 6.4 g/dL — ABNORMAL LOW (ref 6.5–8.1)

## 2019-07-22 LAB — URINALYSIS, ROUTINE W REFLEX MICROSCOPIC
Bilirubin Urine: NEGATIVE
Glucose, UA: NEGATIVE mg/dL
Hgb urine dipstick: NEGATIVE
Ketones, ur: NEGATIVE mg/dL
Leukocytes,Ua: NEGATIVE
Nitrite: NEGATIVE
Protein, ur: NEGATIVE mg/dL
Specific Gravity, Urine: 1.01 (ref 1.005–1.030)
pH: 5 (ref 5.0–8.0)

## 2019-07-22 LAB — CBC WITH DIFFERENTIAL/PLATELET
Abs Immature Granulocytes: 0.05 10*3/uL (ref 0.00–0.07)
Basophils Absolute: 0 10*3/uL (ref 0.0–0.1)
Basophils Relative: 0 %
Eosinophils Absolute: 0 10*3/uL (ref 0.0–0.5)
Eosinophils Relative: 0 %
HCT: 21.8 % — ABNORMAL LOW (ref 36.0–46.0)
Hemoglobin: 6.8 g/dL — CL (ref 12.0–15.0)
Immature Granulocytes: 1 %
Lymphocytes Relative: 14 %
Lymphs Abs: 1.2 10*3/uL (ref 0.7–4.0)
MCH: 34.9 pg — ABNORMAL HIGH (ref 26.0–34.0)
MCHC: 31.2 g/dL (ref 30.0–36.0)
MCV: 111.8 fL — ABNORMAL HIGH (ref 80.0–100.0)
Monocytes Absolute: 0.8 10*3/uL (ref 0.1–1.0)
Monocytes Relative: 9 %
Neutro Abs: 6.6 10*3/uL (ref 1.7–7.7)
Neutrophils Relative %: 76 %
Platelets: 139 10*3/uL — ABNORMAL LOW (ref 150–400)
RBC: 1.95 MIL/uL — ABNORMAL LOW (ref 3.87–5.11)
RDW: 17.2 % — ABNORMAL HIGH (ref 11.5–15.5)
WBC: 8.7 10*3/uL (ref 4.0–10.5)
nRBC: 0 % (ref 0.0–0.2)

## 2019-07-22 LAB — LACTIC ACID, PLASMA
Lactic Acid, Venous: 0.8 mmol/L (ref 0.5–1.9)
Lactic Acid, Venous: 0.6 mmol/L (ref 0.5–1.9)

## 2019-07-22 LAB — IRON AND TIBC
Iron: 26 ug/dL — ABNORMAL LOW (ref 28–170)
Saturation Ratios: 7 % — ABNORMAL LOW (ref 10.4–31.8)
TIBC: 373 ug/dL (ref 250–450)
UIBC: 347 ug/dL

## 2019-07-22 LAB — FIBRINOGEN: Fibrinogen: 766 mg/dL — ABNORMAL HIGH (ref 210–475)

## 2019-07-22 LAB — VITAMIN B12: Vitamin B-12: 4266 pg/mL — ABNORMAL HIGH (ref 180–914)

## 2019-07-22 LAB — HCG, SERUM, QUALITATIVE: Preg, Serum: NEGATIVE

## 2019-07-22 LAB — RETICULOCYTES
Immature Retic Fract: 17.9 % — ABNORMAL HIGH (ref 2.3–15.9)
RBC.: 1.93 MIL/uL — ABNORMAL LOW (ref 3.87–5.11)
Retic Count, Absolute: 37.1 10*3/uL (ref 19.0–186.0)
Retic Ct Pct: 1.9 % (ref 0.4–3.1)

## 2019-07-22 LAB — ABO/RH: ABO/RH(D): A POS

## 2019-07-22 LAB — FOLATE: Folate: 30.3 ng/mL (ref >5.9)

## 2019-07-22 LAB — PROCALCITONIN: Procalcitonin: <0.1 ng/mL

## 2019-07-22 LAB — TRIGLYCERIDES: Triglycerides: 98 mg/dL (ref <150)

## 2019-07-22 LAB — FERRITIN: Ferritin: 112 ng/mL (ref 11–307)

## 2019-07-22 LAB — PREPARE RBC (CROSSMATCH)

## 2019-07-22 LAB — I-STAT BETA HCG BLOOD, ED (MC, WL, AP ONLY): I-stat hCG, quantitative: 6 m[IU]/mL — ABNORMAL HIGH (ref <5)

## 2019-07-22 LAB — LACTATE DEHYDROGENASE: LDH: 169 U/L (ref 98–192)

## 2019-07-22 LAB — C-REACTIVE PROTEIN: CRP: 8.9 mg/dL — ABNORMAL HIGH (ref <1.0)

## 2019-07-22 LAB — D-DIMER, QUANTITATIVE: D-Dimer, Quant: 3.18 ug/mL-FEU — ABNORMAL HIGH (ref 0.00–0.50)

## 2019-07-22 LAB — OCCULT BLOOD X 1 CARD TO LAB, STOOL: Fecal Occult Bld: NEGATIVE

## 2019-07-22 LAB — BRAIN NATRIURETIC PEPTIDE: B Natriuretic Peptide: 4.7 pg/mL (ref 0.0–100.0)

## 2019-07-22 LAB — SARS CORONAVIRUS 2 BY RT PCR (HOSPITAL ORDER, PERFORMED IN ~~LOC~~ HOSPITAL LAB): SARS Coronavirus 2: POSITIVE — AB

## 2019-07-22 MED ORDER — ASCORBIC ACID 500 MG PO TABS
500.0000 mg | ORAL_TABLET | Freq: Every day | ORAL | Status: DC
  Administered 2019-07-22 – 2019-07-25 (×4): 500 mg via ORAL
  Filled 2019-07-22 (×4): qty 1

## 2019-07-22 MED ORDER — OXYCODONE-ACETAMINOPHEN 10-325 MG PO TABS: 1.0000 | ORAL_TABLET | ORAL | Status: DC | PRN

## 2019-07-22 MED ORDER — IOHEXOL 300 MG/ML  SOLN
100.0000 mL | Freq: Once | INTRAMUSCULAR | Status: AC | PRN
  Administered 2019-07-22: 100 mL via INTRAVENOUS

## 2019-07-22 MED ORDER — OXYCODONE-ACETAMINOPHEN 5-325 MG PO TABS
1.0000 | ORAL_TABLET | ORAL | Status: DC | PRN
  Administered 2019-07-22: 1 via ORAL
  Filled 2019-07-22: qty 1

## 2019-07-22 MED ORDER — POTASSIUM CHLORIDE 10 MEQ/100ML IV SOLN
10.0000 meq | Freq: Once | INTRAVENOUS | Status: AC
  Administered 2019-07-22: 10 meq via INTRAVENOUS
  Filled 2019-07-22: qty 100

## 2019-07-22 MED ORDER — OXYCODONE HCL 5 MG PO TABS: 5.0000 mg | ORAL_TABLET | ORAL | Status: DC | PRN

## 2019-07-22 MED ORDER — ONDANSETRON HCL 4 MG/2ML IJ SOLN
4.0000 mg | Freq: Four times a day (QID) | INTRAMUSCULAR | Status: DC | PRN
  Administered 2019-07-23: 4 mg via INTRAVENOUS
  Filled 2019-07-22: qty 2

## 2019-07-22 MED ORDER — AMOXICILLIN-POT CLAVULANATE 875-125 MG PO TABS
1.0000 | ORAL_TABLET | Freq: Two times a day (BID) | ORAL | Status: DC
  Administered 2019-07-22 – 2019-07-25 (×6): 1 via ORAL
  Filled 2019-07-22 (×6): qty 1

## 2019-07-22 MED ORDER — LORAZEPAM 0.5 MG PO TABS: 0.5000 mg | ORAL_TABLET | Freq: Four times a day (QID) | ORAL | Status: DC | PRN

## 2019-07-22 MED ORDER — SALINE SPRAY 0.65 % NA SOLN
1.0000 | NASAL | Status: DC
  Filled 2019-07-22 (×2): qty 44

## 2019-07-22 MED ORDER — SODIUM CHLORIDE 0.9 % IV SOLN
2.0000 g | Freq: Once | INTRAVENOUS | Status: AC
  Administered 2019-07-22: 2 g via INTRAVENOUS
  Filled 2019-07-22: qty 2

## 2019-07-22 MED ORDER — POTASSIUM CHLORIDE CRYS ER 20 MEQ PO TBCR
40.0000 meq | EXTENDED_RELEASE_TABLET | Freq: Once | ORAL | Status: AC
  Administered 2019-07-22: 40 meq via ORAL
  Filled 2019-07-22: qty 2

## 2019-07-22 MED ORDER — PANTOPRAZOLE SODIUM 40 MG IV SOLR
40.0000 mg | Freq: Two times a day (BID) | INTRAVENOUS | Status: DC
  Administered 2019-07-22 – 2019-07-23 (×2): 40 mg via INTRAVENOUS
  Filled 2019-07-22 (×2): qty 40

## 2019-07-22 MED ORDER — LORATADINE 10 MG PO TABS
10.0000 mg | ORAL_TABLET | Freq: Every day | ORAL | Status: DC
  Administered 2019-07-22 – 2019-07-24 (×3): 10 mg via ORAL
  Filled 2019-07-22 (×3): qty 1

## 2019-07-22 MED ORDER — PROCHLORPERAZINE MALEATE 10 MG PO TABS
10.0000 mg | ORAL_TABLET | Freq: Four times a day (QID) | ORAL | Status: DC | PRN
  Administered 2019-07-23: 10 mg via ORAL
  Filled 2019-07-22: qty 1

## 2019-07-22 MED ORDER — ONDANSETRON HCL 4 MG/2ML IJ SOLN
4.0000 mg | Freq: Once | INTRAMUSCULAR | Status: AC
  Administered 2019-07-22: 4 mg via INTRAVENOUS
  Filled 2019-07-22: qty 2

## 2019-07-22 MED ORDER — SODIUM CHLORIDE (PF) 0.9 % IJ SOLN
INTRAMUSCULAR | Status: AC
  Filled 2019-07-22: qty 50

## 2019-07-22 MED ORDER — ACETAMINOPHEN 325 MG PO TABS
650.0000 mg | ORAL_TABLET | Freq: Four times a day (QID) | ORAL | Status: DC | PRN
  Administered 2019-07-23 – 2019-07-24 (×3): 650 mg via ORAL
  Filled 2019-07-22 (×3): qty 2

## 2019-07-22 MED ORDER — ACETAMINOPHEN 325 MG PO TABS
650.0000 mg | ORAL_TABLET | Freq: Once | ORAL | Status: AC
  Administered 2019-07-22: 650 mg via ORAL
  Filled 2019-07-22: qty 2

## 2019-07-22 MED ORDER — VANCOMYCIN HCL 1750 MG/350ML IV SOLN
1750.0000 mg | Freq: Once | INTRAVENOUS | Status: AC
  Administered 2019-07-22: 1750 mg via INTRAVENOUS
  Filled 2019-07-22: qty 350

## 2019-07-22 MED ORDER — SODIUM CHLORIDE 0.9 % IV BOLUS
1000.0000 mL | Freq: Once | INTRAVENOUS | Status: AC
  Administered 2019-07-22: 1000 mL via INTRAVENOUS

## 2019-07-22 MED ORDER — VANCOMYCIN HCL IN DEXTROSE 1-5 GM/200ML-% IV SOLN: 1000.0000 mg | Freq: Once | INTRAVENOUS | Status: DC

## 2019-07-22 MED ORDER — ONDANSETRON HCL 4 MG PO TABS: 4.0000 mg | ORAL_TABLET | Freq: Four times a day (QID) | ORAL | Status: DC | PRN

## 2019-07-22 MED ORDER — LOPERAMIDE HCL 2 MG PO CAPS: 2.0000 mg | ORAL_CAPSULE | Freq: Four times a day (QID) | ORAL | Status: DC | PRN

## 2019-07-22 MED ORDER — IPRATROPIUM-ALBUTEROL 20-100 MCG/ACT IN AERS
1.0000 | INHALATION_SPRAY | Freq: Four times a day (QID) | RESPIRATORY_TRACT | Status: DC
  Administered 2019-07-23 – 2019-07-24 (×6): 1 via RESPIRATORY_TRACT
  Filled 2019-07-22: qty 4

## 2019-07-22 MED ORDER — INSULIN ASPART 100 UNIT/ML ~~LOC~~ SOLN
0.0000 [IU] | Freq: Every day | SUBCUTANEOUS | Status: DC
  Filled 2019-07-22: qty 0.05

## 2019-07-22 MED ORDER — METHADONE HCL 10 MG PO TABS
10.0000 mg | ORAL_TABLET | Freq: Two times a day (BID) | ORAL | Status: DC
  Administered 2019-07-22 – 2019-07-25 (×6): 10 mg via ORAL
  Filled 2019-07-22 (×2): qty 1
  Filled 2019-07-22 (×3): qty 2
  Filled 2019-07-22: qty 1

## 2019-07-22 MED ORDER — ZINC SULFATE 220 (50 ZN) MG PO CAPS
220.0000 mg | ORAL_CAPSULE | Freq: Every day | ORAL | Status: DC
  Administered 2019-07-22 – 2019-07-25 (×4): 220 mg via ORAL
  Filled 2019-07-22 (×4): qty 1

## 2019-07-22 MED ORDER — HYDROCOD POLST-CPM POLST ER 10-8 MG/5ML PO SUER: 5.0000 mL | Freq: Two times a day (BID) | ORAL | Status: DC | PRN

## 2019-07-22 MED ORDER — GUAIFENESIN-DM 100-10 MG/5ML PO SYRP: 10.0000 mL | ORAL_SOLUTION | ORAL | Status: DC | PRN

## 2019-07-22 MED ORDER — SODIUM CHLORIDE 0.9 % IV SOLN
10.0000 mL/h | Freq: Once | INTRAVENOUS | Status: AC
  Administered 2019-07-22: 10 mL/h via INTRAVENOUS

## 2019-07-22 MED ORDER — SODIUM CHLORIDE 0.9 % IV SOLN
100.0000 mg | INTRAVENOUS | Status: AC
  Administered 2019-07-22 – 2019-07-23 (×2): 100 mg via INTRAVENOUS
  Filled 2019-07-22: qty 20

## 2019-07-22 MED ORDER — SODIUM CHLORIDE 0.9 % IV SOLN
100.0000 mg | Freq: Every day | INTRAVENOUS | Status: DC
  Administered 2019-07-23 – 2019-07-25 (×3): 100 mg via INTRAVENOUS
  Filled 2019-07-22 (×4): qty 20

## 2019-07-22 NOTE — ED Notes (Signed)
hospitalist notified of pt blood pressure. Last bp 82/60 map 48 , pt is alert and oriented, and in no apparent distress. Pt first unit of blood still actively running at this time.

## 2019-07-22 NOTE — Telephone Encounter (Signed)
No 6/18 los. No changes made to pt's schedule.

## 2019-07-22 NOTE — Telephone Encounter (Signed)
Received call from pt with complaint of headache, congestion, cough and fever of 102.7 orally.  Pt states her son tested positive for Covid yesterday.  Per MD pt needs to go to the emergency room for further evaluation and treatment.  Pt verbalized understanding.

## 2019-07-22 NOTE — Progress Notes (Signed)
A consult was received from an ED physician for Vancomycin and Cefepime per pharmacy dosing.  The patient's profile has been reviewed for ht/wt/allergies/indication/available labs.    A one time order has been placed for Vancomycin 1750mg  IV and Cefepime 2g IV.  Further antibiotics/pharmacy consults should be ordered by admitting physician if indicated.                       Thank you, Luiz Ochoa 07/22/2019  2:42 PM

## 2019-07-22 NOTE — ED Notes (Addendum)
Radiology at pt bedside Pt transported to CT

## 2019-07-22 NOTE — ED Triage Notes (Signed)
Patient here from home reporting fever, cough since yesterday, increased today. Cancer patient, chemo. Reports that she was exposed to covid by son.

## 2019-07-22 NOTE — H&P (Signed)
History and Physical    Laura Anthony GEX:[id number] DOB: November 16, 1975 DOA: 07/22/2019  PCP: [name], MD   Patient coming from: Home  Chief Complaint: Fever and cough, diarrhea, fatigue  HPI: Laura Anthony is a 44 y.o. female with medical history significant of anemia, anxiety, history of metastatic breast cancer with bilateral mastectomy status post radiation and chemotherapy currently on chemotherapy, ADD, hyperlipidemia, hypothyroidism, history of DVT in the lower extremity as well as upper extremities and other comorbidities who presented to her oncology office on 07/19/2019 with a piece of her extruded facial bone in the setting of her osteonecrosis.  She was started on antibiotics with Augmentin.  Saturday night she started feeling bad and then Sunday night she started spiking a temperature.  She started having a mild cough and fatigue on Saturday night.  Monday morning she woke up and she had a significant fever and had some diarrhea on Sunday night.  She has been exposed to child who is positive for Covid.  She has received her Covid vaccine with [employer] as well.  She denies any chest pain but states that this started with a headache on Saturday.  No other complaints of this time but she does complain of left-sided abdominal pain and tenderness that radiates into the mid abdomen.  She has not been nauseous or vomiting though.  No burning or discomfort in her urine and states that she is coughing up some sputum.  In the ED she is found to be Covid positive as she has a recent exposure.  [facility] was asked to admit this patient for Covid pneumonia as her x-ray did show possible pneumonia.  We will further work this up with a CT scan of the chest.  We will start patient on remdesivir  ED Course: In the ED she had basic blood work done and had a chest x-ray as well as an EKG.  Covid testing was positive.  Procalcitonin level and lactic acid level were normal.  Inflammatory  markers are still pending.  I asked the EDP to order a CT of the chest.  Review of Systems: As per HPI otherwise all other systems reviewed and negative.   Past Medical History:  Diagnosis Date  . ADD (attention deficit disorder)   . Anemia   . Anxiety   . Breast cancer, left breast ([place])    S/P mastectomy 03/27/2017  . DVT (deep venous thrombosis) ([place]) 2017   calf left - probably due to [facility] pills-took eliquis x3 mos, nonthing now  . High cholesterol   . Impingement syndrome of right shoulder 07/2013  . Migraine    "usually 1/month" (03/28/2017)  . PONV (postoperative nausea and vomiting)   . Right bicipital tenosynovitis 07/2013  . Rotator cuff impingement syndrome of right shoulder 07/12/2013  . Seizures ([place])    x 1 as a child - was never on anticonvulsants (03/28/2017)   Past Surgical History:  Procedure Laterality Date  . ADENOIDECTOMY  1981  . ANKLE ARTHROSCOPY Right   . BREAST BIOPSY Left 10/2016  . KNEE ARTHROSCOPY Right   . KNEE ARTHROSCOPY W/ ACL RECONSTRUCTION Left   . LIPOSUCTION WITH LIPOFILLING Bilateral 01/05/2018   Procedure: LIPOFILLING FROM ABDOMEN TO BILATERAL CHEST;  Surgeon: Irene Limbo, MD;  Location: [facility];  Service: Plastics;  Laterality: Bilateral;  . MASTECTOMY Left 03/27/2017   NIPPLE SPARING MASTECTOMY WITH RADIOACTIVE SEED TARGETED LYMPH NODE EXCISION AND LEFT AXILLARY SENTINEL LYMPH NODE BIOPSY  . MASTECTOMY Right  03/27/2017   RIGHT PROPHYLACTIC NIPPLE SPARING MASTECTOMY  . NIPPLE SPARING MASTECTOMY Right 03/27/2017   Procedure: RIGHT PROPHYLACTIC NIPPLE SPARING MASTECTOMY;  Surgeon: Rolm Bookbinder, MD;  Location: [place];  Service: General;  Laterality: Right;  . PORT-A-CATH REMOVAL Right 01/05/2018   Procedure: REMOVAL RIGHT CHEST PORT;  Surgeon: Irene Limbo, MD;  Location: [place];  Service: Plastics;  Laterality: Right;  . PORTACATH PLACEMENT N/A 11/01/2016   Procedure: INSERTION  PORT-A-CATH WITH [place];  Surgeon: Rolm Bookbinder, MD;  Location: [place];  Service: General;  Laterality: N/A;  . PORTACATH PLACEMENT N/A 08/23/2018   Procedure: INSERTION PORT-A-CATH WITH ULTRASOUND;  Surgeon: Rolm Bookbinder, MD;  Location: [place];  Service: General;  Laterality: N/A;  . RADIOACTIVE SEED GUIDED AXILLARY SENTINEL LYMPH NODE Left 03/27/2017   Procedure: LEFT NIPPLE SPARING MASTECTOMY WITH RADIOACTIVE SEED TARGETED LYMPH NODE EXCISION AND LEFT AXILLARY SENTINEL LYMPH NODE BIOPSY;  Surgeon: Rolm Bookbinder, MD;  Location: [place];  Service: General;  Laterality: Left;  REQUESTS RNFA  . RECONSTRUCTION BREAST IMMEDIATE / DELAYED W/ TISSUE EXPANDER Bilateral 03/27/2017   BILATERAL BREAST RECONSTRUCTION WITH PLACEMENT OF TISSUE EXPANDER AND ALLODERM  . REMOVAL OF BILATERAL TISSUE EXPANDERS WITH PLACEMENT OF BILATERAL BREAST IMPLANTS Bilateral 01/05/2018   Procedure: REMOVAL OF BILATERAL TISSUE EXPANDERS WITH PLACEMENT OF BILATERAL BREAST IMPLANTS;  Surgeon: Irene Limbo, MD;  Location: [facility];  Service: Plastics;  Laterality: Bilateral;  . SEPTOPLASTY WITH ETHMOIDECTOMY, AND MAXILLARY ANTROSTOMY  10/29/2010   bilat. max. antrostomy with left max. stripping; left ant. ethmoidectomy; right total ethmoidectomy; sphenoidotomy  . SHOULDER ARTHROSCOPY WITH SUBACROMIAL DECOMPRESSION AND BICEP TENDON REPAIR Right 07/12/2013   Procedure: RIGHT SHOULDER ARTHROSCOPY DEBRIDEMENT EXTENSIVE DECOMPRESSION SUBACROMIAL PARTIAL ACROMIOPLASTY;  Surgeon: Johnny Bridge, MD;  Location: [place];  Service: Orthopedics;  Laterality: Right;  . WRIST ARTHROSCOPY  01/17/2012   Procedure: ARTHROSCOPY WRIST; right wrist Surgeon: Tennis Must, MD;  Location: [place];  Service: Orthopedics;  Laterality: Right;  RIGHT WRIST ARTHROSCOPY WITH TRIANGULAR FIBROCARTILAGE COMPLEX REPAIR AND DEBRIDEMENT    SOCIAL HISTORY  reports  that she has never smoked. She has never used smokeless tobacco. She reports current alcohol use. She reports that she does not use drugs.  Allergies  Allergen Reactions  . Statins Other (See Comments)    Leg pain  . Sumatriptan Other (See Comments)    Numbness to face   . Tape Rash    Rash from dressing over port-a-cath    Family History  Problem Relation Age of Onset  . Breast cancer Mother 79       triple negative  . Leukemia Father   . Lung cancer Father   . Heart attack Maternal Uncle   . Prostate cancer Paternal Uncle   . COPD Paternal Grandmother   . Heart disease Paternal Grandfather   . Prostate cancer Paternal Uncle   . Leukemia Cousin    Prior to Admission medications   Medication Sig Start Date End Date Taking? Authorizing Provider  amoxicillin-clavulanate (AUGMENTIN) 875-125 MG tablet Take 1 tablet by mouth 2 (two) times daily. 07/18/19  Yes Tanner, [name] E., PA-C  ELIQUIS 5 MG TABS tablet Take 1 tablet (5 mg total) by mouth 2 (two) times daily. 03/01/19  Yes Nicholas Lose, MD  gabapentin (NEURONTIN) 300 MG capsule Take 1 tablet by mouth in morning and afternoon, take 2 tablets by mouth at bedtime Patient taking differently: Take 300-600 mg  by mouth in the morning, at noon, and at bedtime. Take 1 tablet by mouth in morning and afternoon, take 2 tablets by mouth at bedtime 07/05/19  Yes Causey, Charlestine Massed, NP  levothyroxine (SYNTHROID) 88 MCG tablet TAKE 1 TABLET (88 MCG TOTAL) BY MOUTH DAILY BEFORE BREAKFAST. 06/06/19  Yes Nicholas Lose, MD  loperamide (IMODIUM A-D) 2 MG tablet Take 2 at diarrhea onset, then 1 every 2hr until 12hrs with no BM. May take 2 every 4hrs at night. If diarrhea recurs repeat. Patient taking differently: Take 2 mg by mouth 4 (four) times daily as needed for diarrhea or loose stools. Take 2 at diarrhea onset, then 1 every 2hr until 12hrs with no BM. May take 2 every 4hrs at night. If diarrhea recurs repeat. 04/01/19  Yes Nicholas Lose, MD    loratadine (CLARITIN) 10 MG tablet Take 10 mg by mouth at bedtime.   Yes [provider]  LORazepam (ATIVAN) 0.5 MG tablet Take 1 tablet (0.5 mg total) by mouth every 6 (six) hours as needed for anxiety. 06/10/19  Yes Tanner, Lyndon Code., PA-C  methadone (DOLOPHINE) 10 MG tablet Take 1 tablet (10 mg total) by mouth every 12 (twelve) hours. 07/05/19  Yes Causey, Charlestine Massed, NP  naloxone [facility]) 4 MG/0.1ML LIQD nasal spray kit Spray once in one nostril prn overdose, may repeat x 1 01/24/19  Yes Hayden Pedro, PA-C  ondansetron (ZOFRAN) 8 MG tablet Take 1 tablet (8 mg total) by mouth 2 (two) times daily as needed. Start on the third day after chemotherapy. Patient taking differently: Take 8 mg by mouth 2 (two) times daily as needed for nausea or vomiting. Start on the third day after chemotherapy. 04/01/19  Yes Nicholas Lose, MD  oxyCODONE-acetaminophen (PERCOCET) 10-325 MG tablet Take 1 tablet by mouth every 4 (four) hours as needed for pain. 04/01/19  Yes Nicholas Lose, MD  pantoprazole (PROTONIX) 40 MG tablet TAKE 1 TABLET BY MOUTH TWICE A DAY 07/22/19  Yes Nicholas Lose, MD  prochlorperazine (COMPAZINE) 10 MG tablet Take 1 tablet (10 mg total) by mouth every 6 (six) hours as needed (Nausea or vomiting). 04/01/19  Yes Nicholas Lose, MD  venlafaxine XR (EFFEXOR-XR) 150 MG 24 hr capsule Take 1 capsule (150 mg total) by mouth daily with breakfast. 07/05/19  Yes Causey, Charlestine Massed, NP  zolpidem (AMBIEN CR) 12.5 MG CR tablet Take 1 tablet (12.5 mg total) by mouth at bedtime as needed for sleep. Patient taking differently: Take 12.5 mg by mouth at bedtime.  05/28/19  Yes Nicholas Lose, MD   Physical Exam: Vitals:   07/22/19 1700 07/22/19 1701 07/22/19 1702 07/22/19 1703  BP: 117/77     Pulse: 100 (!) 101 94 (!) 101  Resp: (!) 36 17 18 (!) 26  Temp:      TempSrc:      SpO2: 91% 95% 94% 93%   Constitutional: WN/WD overweight Caucasian female currently in NAD and appears calm  Eyes:  Lids and conjunctivae normal, sclerae anicteric  ENMT: External Ears, Nose appear normal. Grossly normal hearing Neck: Appears normal, supple, no cervical masses, normal ROM, no appreciable thyromegaly no JVD Respiratory: Diminished to auscultation bilaterally with coarse breath sounds, no wheezing, rales, rhonchi or crackles. Normal respiratory effort and patient is not tachypenic. No accessory muscle use on limited skin evaluation.  Cardiovascular: Slightly tachycardic, no murmurs / rubs / gallops. S1 and S2 auscultated. No extremity edema.  Abdomen: Soft, tender to palpate in the left abdomen, distended secondary body  habitus. Bowel sounds positive.  GU: Deferred. Musculoskeletal: No clubbing / cyanosis of digits/nails. No joint deformity upper and lower extremities.  Skin: No rashes, lesions, ulcers on limited skin evaluation. No induration; Warm and dry.  Has a tattoo on the left lower abdomen Neurologic: CN 2-12 grossly intact with no focal deficits. Romberg sign and cerebellar reflexes not assessed.  Psychiatric: Normal judgment and insight. Alert and oriented x 3. Normal mood and appropriate affect.   Labs on Admission: I have personally reviewed following labs and imaging studies  CBC: Recent Labs  Lab 07/16/19 1235 07/19/19 0814 07/22/19 1452  WBC 28.2* 9.1 8.7  NEUTROABS 23.3* 6.3 6.6  HGB 10.3* 10.5* 6.8*  HCT 31.6* 33.0* 21.8*  MCV 107.5* 105.1* 111.8*  PLT 165 144* 338*   Basic Metabolic Panel: Recent Labs  Lab 07/16/19 1235 07/19/19 0840 07/22/19 1452  NA 140 140 138  K 3.5 4.0 2.9*  CL 102 101 107  CO2 28 28 23   GLUCOSE 121* 88 129*  BUN 11 9 12   CREATININE 0.81 0.80 0.68  CALCIUM 8.8* 9.3 7.6*  MG 2.1  --   --   PHOS 3.6  --   --    GFR: Estimated Creatinine Clearance: 98.2 mL/min (by C-G formula based on SCr of 0.68 mg/dL). Liver Function Tests: Recent Labs  Lab 07/16/19 1235 07/19/19 0840 07/22/19 1452  AST 23 22 19   ALT 23 28 21   ALKPHOS 222*  224* 129*  BILITOT <0.2* 0.3 0.5  PROT 7.0 7.1 6.4*  ALBUMIN 3.2* 3.2* 3.3*   No results for input(s): LIPASE, AMYLASE in the last 168 hours. No results for input(s): AMMONIA in the last 168 hours. Coagulation Profile: No results for input(s): INR, PROTIME in the last 168 hours. Cardiac Enzymes: No results for input(s): CKTOTAL, CKMB, CKMBINDEX, TROPONINI in the last 168 hours. BNP (last 3 results) No results for input(s): PROBNP in the last 8760 hours. HbA1C: No results for input(s): HGBA1C in the last 72 hours. CBG: No results for input(s): GLUCAP in the last 168 hours. Lipid Profile: No results for input(s): CHOL, HDL, LDLCALC, TRIG, CHOLHDL, LDLDIRECT in the last 72 hours. Thyroid Function Tests: No results for input(s): TSH, T4TOTAL, FREET4, T3FREE, THYROIDAB in the last 72 hours. Anemia Panel: No results for input(s): VITAMINB12, FOLATE, FERRITIN, TIBC, IRON, RETICCTPCT in the last 72 hours. Urine analysis:    Component Value Date/Time   COLORURINE YELLOW 02/26/2019 [place] 02/26/2019 1305   LABSPEC 1.018 02/26/2019 1305   PHURINE 7.0 02/26/2019 1305   GLUCOSEU NEGATIVE 02/26/2019 1305   [place] 02/26/2019 [place] 02/26/2019 [place] 02/26/2019 1305   PROTEINUR NEGATIVE 02/26/2019 1305   NITRITE NEGATIVE 02/26/2019 1305   LEUKOCYTESUR TRACE (A) 02/26/2019 1305   Sepsis Labs: !!!!!!!!!!!!!!!!!!!!!!!!!!!!!!!!!!!!!!!!!!!! @LABRCNTIP (procalcitonin:4,lacticidven:4) ) Recent Results (from the past 240 hour(s))  Culture, Blood     Status: None (Preliminary result)   Collection Time: 07/19/19  8:14 AM   Specimen: BLOOD RIGHT ARM  Result Value Ref Range Status   Specimen Description   Final    BLOOD RIGHT ARM Performed at [facility], [place] [address], [place], [place] [address]    Special Requests   Final    NONE Performed at [facility], [place] [address], [place], [place] [address]    Culture   Final    NO GROWTH 3 DAYS Performed at [facility], [address] [place], [place] [address]    Report Status PENDING  Incomplete  Culture, Blood     Status: None (Preliminary result)   Collection Time: 07/19/19  8:22 AM   Specimen: BLOOD  Result Value Ref Range Status   Specimen Description   Final    BLOOD PORTA CATH Performed at [facility], [place] [address], [place], [place] [address]    Special Requests   Final    NONE Performed at [facility], [place] [address], [place], [place] [address]    Culture   Final    NO GROWTH 3 DAYS Performed at [facility], [place] [address] [place], [place] [address]    Report Status PENDING  Incomplete  SARS Coronavirus 2 by RT PCR (hospital order, performed in [facility] hospital lab) Nasopharyngeal Nasopharyngeal Swab     Status: Abnormal   Collection Time: 07/22/19  2:54 PM   Specimen: Nasopharyngeal Swab  Result Value Ref Range Status   SARS Coronavirus 2 POSITIVE (A) NEGATIVE Final    Comment: RESULT CALLED TO, READ BACK BY AND VERIFIED WITH: DOWD, P. RN @1551  07/22/19 BILLINGSLEY,L (NOTE) SARS-CoV-2 target nucleic acids are DETECTED  SARS-CoV-2 RNA is generally detectable in upper respiratory specimens  during the acute phase of infection.  Positive results are indicative  of the presence of the identified virus, but do not rule out bacterial infection or co-infection with other pathogens not detected by the test.  Clinical correlation with patient history and  other diagnostic information is necessary to determine patient infection status.  The expected result is negative.  Fact Sheet for Patients:   StrictlyIdeas.no   Fact Sheet for Healthcare Providers:   BankingDealers.co.za    This test is not yet approved or cleared by the [place] FDA and  has been authorized for detection and/or  diagnosis of SARS-CoV-2 by FDA under an Emergency Use Authorization (EUA).  This EUA will remain in effect (meaning th is test can be used) for the duration of  the COVID-19 declaration under Section 564(b)(1) of the Act, 21 U.S.C. section 360-bbb-3(b)(1), unless the authorization is terminated or revoked sooner.  Performed at [facility], [place] [address], [place], [place] [address]      Radiological Exams on Admission: DG Chest Port 1 View  Result Date: 07/22/2019 CLINICAL DATA:  Fever cough and COVID exposure EXAM: PORTABLE CHEST 1 VIEW COMPARISON:  06/07/2019, CT 05/28/2019, chest x-ray 09/14/2018 FINDINGS: Right-sided central venous port tip over the SVC. Faint pulmonary nodules are slightly less conspicuous. Right eighth rib deformity as before. Subsegmental atelectasis right base. Left infrahilar slight increased opacity. No pneumothorax IMPRESSION: 1. Slight increased left infrahilar opacity may reflect atelectasis or mild pneumonia 2. Faint pulmonary metastatic nodules, slightly less apparent compared to previous. Electronically Signed   By: Donavan Foil M.D.   On: 07/22/2019 15:36    EKG: Independently reviewed. Showed Sinus Tachycardia with a rate of 100 and a QTc of 448. No appreciable ST Elevation or depression on my interpretation   Assessment/Plan Active Problems:   Pneumonia due to COVID-19 virus  Viral sepsis in the setting of COVID-19 disease and Pneumonia -Admit to inpatient progressive care unit given severity of the patient's symptoms -Meet sepsis criteria with a temperature of 101.5, tachycardia, tachypnea, and a source of infection -She presents with a 2-day history of feeling bad on Saturday night with low-grade fevers yesterday and associated diarrhea and high-grade fevers today with cough  and mild dyspnea -Patient's procalcitonin level was less than 0.10 and lactic acid level 0.8 -Check inflammatory markers and continue monitor inflammatory  marker trend daily -No results for input(s): DDIMER, FERRITIN, LDH, CRP in the last 72 hours. (currently pending)  Lab Results  Component Value Date   SARSCOV2NAA POSITIVE (A) 07/22/2019   [place] NEGATIVE 01/02/2019   [place] NEGATIVE 12/09/2018   [place] NEGATIVE 08/20/2018  -Check blood cultures x2 as well as urinalysis with urine culture to rule out other infectious etiologies -Probably not require any supplemental oxygen via nasal cannula with saturations in the mid 90s but does have associated cough -Chest x-ray personally reviewed and I am in agreement with the Radiologist's read of "Slight increased left infrahilar opacity may reflect atelectasis or mild pneumonia  Faint pulmonary metastatic nodules, slightly less apparent compared to previous." -We will hold off on systemic steroids given that she recently had osteonecrosis of the jaw as well as having a lower hemoglobin with concern for bleeding -SpO2: 93 % on Room Air but reportedly desaturated to 87% earlier  -Continue supplemental oxygen via nasal cannula as necessary and wean O2 as tolerated -Antitussives with Tussionex as well as Robitussin-DM -Continue empiric vitamins with zinc and vitamin C -Add scheduled inhaler with Combivent and as needed albuterol inhaler -5-day course of remdesivir -Ordering a CT of the chest with contrast to further delineate and evaluate -Prone is much as possible  Left-sided abdominal pain -Check CT of the abdomen pelvis with contrast -Has been having some loose stools and diarrhea in the setting of Covid disease but has also been on Augmentin which can cause diarrhea -Check for etiologies of acute blood loss with a CT of the abdomen pelvis  COVID-19 associated pneumonia with symptoms of dyspnea cough, fever and diarrhea -See above  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   Symptomatic Anemia/questionable acute blood loss anemia/Macrocytic anemia -Hemoglobin/hematocrit dropped from 3 days ago acutely and went from 10.5/33.0 is now 6.8/21.8 -She does take apixaban for history of DVT next-FOBT was negative next-continue to monitor for signs or symptoms of bleeding; currently no overt bleeding noted but will need to continue monitor closely -Question if this is in the setting of chemotherapy unlikely this is hemolysis given her normal bilirubin -Will pan scan and check a CT of the abdomen pelvis with contrast to see if the patient has any blood extravasation -Check anemia panel now as she is going to be transfused 2 units of PRBCs -Check CBC in the AM -Medical Oncology will be Consulted but may need a GI Consult   Hypokalemia -Patient's K+ was 2.9 -Given p.o. replacement with IV KCl 1 run and was 40 mg p.o. -Continue monitor and replete as necessary -We will check magnesium level now  -Repeat CMP in a.m.  Hyperglycemia -Patient blood sugar on admission was elevated 29 -  Check hemoglobin A1c -Continue with glycemic Covid protocol  History of DVT in the left leg legs as well as in the upper arm and the right arm -Currently anticoagulated with apixaban at home but will currently hold given her anemia  Hypothyroidism -Check TSH in a.m. -Continue with  Levothyroxine  Chronic pain syndrome -Continue with methadone 10 mg p.o. every 12 hours as well as oxycodone-acetaminophen 10-325 mg 1 tab p.o. every 4 hours as needed for pain -Continue with gabapentin home dosing  Depression and anxiety  -Continue venlafaxine Exar 150 mg p.o. daily  Thrombocytopenia -Patient's platelet count is now 139,000 -In the setting of chemotherapy possibly over possible bleeding  -Continue monitor for signs and symptoms of bleeding-continue to monitor and trend and repeat CBC in a.m.  DVT prophylaxis: SCDs; will hold pharmacological prophylaxis given anemia and thrombocytopenia Code Status: FULL CODE  Family Communication: Discussed with Husband at bedside  Disposition Plan: Pending further improvement Consults called: Medical Oncology Dr. Lindi Adie Admission status: Inpatient progressive care unit with telemetry  Severity of Illness: The appropriate patient status for this patient is INPATIENT. Inpatient status is judged to be reasonable and necessary in order to provide the required intensity of service to ensure the patient's safety. The patient's presenting symptoms, physical exam findings, and initial radiographic and laboratory data in the context of their chronic comorbidities is felt to place them at high risk for further clinical deterioration. Furthermore, it is not anticipated that the patient will be medically stable for discharge from the hospital within 2 midnights of admission. The following factors support the patient status of inpatient.   " The patient's presenting symptoms include cough, dyspnea, fever, diarrhea " The worrisome physical exam findings include diminished breath sounds,. " The initial radiographic and laboratory data are worrisome because of Covid positive " The chronic co-morbidities are listed as above  * I certify that at the point of admission it is my clinical judgment that the patient will require inpatient hospital care  spanning beyond 2 midnights from the point of admission due to high intensity of service, high risk for further deterioration and high frequency of surveillance required.*  Status is: Inpatient  Remains inpatient appropriate because:Ongoing diagnostic testing needed not appropriate for outpatient work up, Unsafe d/c plan, IV treatments appropriate due to intensity of illness or inability to take PO and Inpatient level of care appropriate due to severity of illness   Dispo: The patient is from: Home              Anticipated d/c is to: Home              Anticipated d/c date is: 3 days              Patient currently is not medically stable to d/c.   Kerney Elbe, D.O. [facility] PAGER is on [place]  If 7PM-7AM, please contact night-coverage [email or web address]  07/22/2019, 5:47 PM

## 2019-07-22 NOTE — ED Notes (Signed)
RN aware blood bank called, blood is ready in blood bank

## 2019-07-22 NOTE — Progress Notes (Signed)
These results were reviewed with the patient.

## 2019-07-22 NOTE — ED Provider Notes (Signed)
[facility] DEPT Provider Note   CSN: [id number] Arrival date & time: 07/22/19  1404     History Chief Complaint  Patient presents with  . Fever    Laura Anthony is a 44 y.o. female.  Metastatic breast cancer patient currently on chemotherapy presents to ER with fever.  Symptoms since yesterday, worsening today.  Has had cough, body aches, chills, intermittent headaches.  Fever up to 102.7 at home.  Patient had been treated for jaw pain with Augmentin, states that her pain is currently improving.  Patient's son has Covid and she had significant exposure.  Has some mild shortness of breath currently.  Endorses compliance with her anticoagulation.  HPI     Past Medical History:  Diagnosis Date  . ADD (attention deficit disorder)   . Anemia   . Anxiety   . Breast cancer, left breast ([place])    S/P mastectomy 03/27/2017  . DVT (deep venous thrombosis) ([place]) 2017   calf left - probably due to [facility] pills-took eliquis x3 mos, nonthing now  . High cholesterol   . Impingement syndrome of right shoulder 07/2013  . Migraine    "usually 1/month" (03/28/2017)  . PONV (postoperative nausea and vomiting)   . Right bicipital tenosynovitis 07/2013  . Rotator cuff impingement syndrome of right shoulder 07/12/2013  . Seizures ([place])    x 1 as a child - was never on anticonvulsants (03/28/2017)    Patient Active Problem List   Diagnosis Date Noted  . 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 Palliative care patient 01/06/2019  . Inadequate pain control 01/02/2019  . Acute embolism and thrombosis of unspecified deep veins of unspecified lower extremity ([place]) 12/26/2018  . Neutropenic fever ([place]) 12/26/2018  . Sorethroat 12/26/2018  . 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 Neck pain 12/09/2018  . Hypothyroidism 12/09/2018  . Goals of care, counseling/discussion 12/06/2018  .  Metastatic breast cancer ([name]) 08/08/2018  . Bone metastases ([place]) 08/07/2018  . Pain from bone metastases ([place]) 08/07/2018  . Family history of breast cancer 04/02/2018  . Family history of prostate cancer 04/02/2018  . History of therapeutic radiation 10/16/2017  . Acquired absence of both breasts 04/03/2017  . Breast cancer, stage 2, left ([place]) 03/27/2017  . Port-A-Cath in place 12/16/2016  . Genetic testing 12/06/2016  . Encounter for antineoplastic chemotherapy 11/25/2016  . Malignant neoplasm of lower-outer quadrant of left breast of female, estrogen receptor negative ([place]) 10/27/2016  . Breast lump on left side at 1 o'clock position 05/22/2015  . Migraine without aura or status migrainosus 05/29/2014  . Hyperlipidemia LDL goal <100 10/22/2013  . Depression 09/09/2013  . Rotator cuff impingement syndrome of left shoulder 07/12/2013  . ADD (attention deficit disorder) 01/09/2013  . Anxiety 12/13/2012  . Insomnia 12/13/2012    Past Surgical History:  Procedure Laterality Date  . ADENOIDECTOMY  1981  . ANKLE ARTHROSCOPY Right   . BREAST BIOPSY Left 10/2016  . KNEE ARTHROSCOPY Right   . KNEE ARTHROSCOPY W/ ACL RECONSTRUCTION Left   . LIPOSUCTION WITH LIPOFILLING Bilateral 01/05/2018   Procedure: LIPOFILLING FROM ABDOMEN TO BILATERAL CHEST;  Surgeon: Irene Limbo, MD;  Location: [facility];  Service: Plastics;  Laterality: Bilateral;  . MASTECTOMY Left 03/27/2017   NIPPLE SPARING MASTECTOMY WITH RADIOACTIVE SEED TARGETED LYMPH NODE EXCISION AND LEFT AXILLARY SENTINEL LYMPH NODE BIOPSY  . MASTECTOMY Right 03/27/2017   RIGHT  PROPHYLACTIC NIPPLE SPARING MASTECTOMY  . NIPPLE SPARING MASTECTOMY Right 03/27/2017   Procedure: RIGHT PROPHYLACTIC NIPPLE SPARING MASTECTOMY;  Surgeon: Rolm Bookbinder, MD;  Location: [facility];  Service: General;  Laterality: Right;  . PORT-A-CATH REMOVAL Right 01/05/2018   Procedure: REMOVAL RIGHT CHEST PORT;  Surgeon:  Irene Limbo, MD;  Location: [facility];  Service: Plastics;  Laterality: Right;  . PORTACATH PLACEMENT N/A 11/01/2016   Procedure: INSERTION PORT-A-CATH WITH [place];  Surgeon: Rolm Bookbinder, MD;  Location: [facility];  Service: General;  Laterality: N/A;  . PORTACATH PLACEMENT N/A 08/23/2018   Procedure: INSERTION PORT-A-CATH WITH ULTRASOUND;  Surgeon: Rolm Bookbinder, MD;  Location: [facility];  Service: General;  Laterality: N/A;  . RADIOACTIVE SEED GUIDED AXILLARY SENTINEL LYMPH NODE Left 03/27/2017   Procedure: LEFT NIPPLE SPARING MASTECTOMY WITH RADIOACTIVE SEED TARGETED LYMPH NODE EXCISION AND LEFT AXILLARY SENTINEL LYMPH NODE BIOPSY;  Surgeon: Rolm Bookbinder, MD;  Location: [facility];  Service: General;  Laterality: Left;  REQUESTS RNFA  . RECONSTRUCTION BREAST IMMEDIATE / DELAYED W/ TISSUE EXPANDER Bilateral 03/27/2017   BILATERAL BREAST RECONSTRUCTION WITH PLACEMENT OF TISSUE EXPANDER AND ALLODERM  . REMOVAL OF BILATERAL TISSUE EXPANDERS WITH PLACEMENT OF BILATERAL BREAST IMPLANTS Bilateral 01/05/2018   Procedure: REMOVAL OF BILATERAL TISSUE EXPANDERS WITH PLACEMENT OF BILATERAL BREAST IMPLANTS;  Surgeon: Irene Limbo, MD;  Location: [facility];  Service: Plastics;  Laterality: Bilateral;  . SEPTOPLASTY WITH ETHMOIDECTOMY, AND MAXILLARY ANTROSTOMY  10/29/2010   bilat. max. antrostomy with left max. stripping; left ant. ethmoidectomy; right total ethmoidectomy; sphenoidotomy  . SHOULDER ARTHROSCOPY WITH SUBACROMIAL DECOMPRESSION AND BICEP TENDON REPAIR Right 07/12/2013   Procedure: RIGHT SHOULDER ARTHROSCOPY DEBRIDEMENT EXTENSIVE DECOMPRESSION SUBACROMIAL PARTIAL ACROMIOPLASTY;  Surgeon: Johnny Bridge, MD;  Location: [facility];  Service: Orthopedics;  Laterality: Right;  . WRIST ARTHROSCOPY  01/17/2012   Procedure: ARTHROSCOPY WRIST; right wrist Surgeon: Tennis Must, MD;  Location: [facility];  Service: Orthopedics;  Laterality: Right;  RIGHT WRIST ARTHROSCOPY WITH TRIANGULAR FIBROCARTILAGE COMPLEX REPAIR AND DEBRIDEMENT      OB History   No obstetric history on file.     Family History  Problem Relation Age of Onset  . Breast cancer Mother 30       triple negative  . Leukemia Father   . Lung cancer Father   . Heart attack Maternal Uncle   . Prostate cancer Paternal Uncle   . COPD Paternal Grandmother   . Heart disease Paternal Grandfather   . Prostate cancer Paternal Uncle   . Leukemia Cousin     Social History   Tobacco Use  . Smoking status: Never Smoker  . Smokeless tobacco: Never Used  Vaping Use  . Vaping Use: Never used  Substance Use Topics  . Alcohol use: Yes    Comment: Drinks very rare  . Drug use: No    Home Medications Prior to Admission medications   Medication Sig Start Date End Date Taking? Authorizing Provider  amoxicillin-clavulanate (AUGMENTIN) 875-125 MG tablet Take 1 tablet by mouth 2 (two) times daily. 07/18/19   Tanner, Lyndon Code., PA-C  cetirizine (ZYRTEC) 10 MG tablet Take 10 mg by mouth daily.    [provider]  ELIQUIS 5 MG TABS tablet Take 1 tablet (5 mg total) by mouth 2 (two) times daily. 03/01/19   Nicholas Lose, MD  gabapentin (NEURONTIN) 300 MG capsule Take 1 tablet by mouth in morning and afternoon, take 2  tablets by mouth at bedtime 07/05/19   Gardenia Phlegm, NP  levothyroxine (SYNTHROID) 88 MCG tablet TAKE 1 TABLET (88 MCG TOTAL) BY MOUTH DAILY BEFORE BREAKFAST. 06/06/19   Nicholas Lose, MD  loperamide (IMODIUM A-D) 2 MG tablet Take 2 at diarrhea onset, then 1 every 2hr until 12hrs with no BM. May take 2 every 4hrs at night. If diarrhea recurs repeat. 04/01/19   Nicholas Lose, MD  LORazepam (ATIVAN) 0.5 MG tablet Take 1 tablet (0.5 mg total) by mouth every 6 (six) hours as needed for anxiety. 06/10/19   Tanner, Lyndon Code., PA-C  methadone (DOLOPHINE) 10 MG tablet Take 1 tablet (10 mg total) by mouth every 12  (twelve) hours. 07/05/19   Gardenia Phlegm, NP  naloxone [facility]) 4 MG/0.1ML LIQD nasal spray kit Spray once in one nostril prn overdose, may repeat x 1 01/24/19   Hayden Pedro, PA-C  ondansetron (ZOFRAN) 8 MG tablet Take 1 tablet (8 mg total) by mouth 2 (two) times daily as needed. Start on the third day after chemotherapy. 04/01/19   Nicholas Lose, MD  oxyCODONE-acetaminophen (PERCOCET) 10-325 MG tablet Take 1 tablet by mouth every 4 (four) hours as needed for pain. 04/01/19   Nicholas Lose, MD  pantoprazole (PROTONIX) 40 MG tablet TAKE 1 TABLET BY MOUTH TWICE A DAY 07/22/19   Nicholas Lose, MD  prochlorperazine (COMPAZINE) 10 MG tablet Take 1 tablet (10 mg total) by mouth every 6 (six) hours as needed (Nausea or vomiting). 04/01/19   Nicholas Lose, MD  venlafaxine XR (EFFEXOR-XR) 150 MG 24 hr capsule Take 1 capsule (150 mg total) by mouth daily with breakfast. 07/05/19   Causey, Charlestine Massed, NP  zolpidem (AMBIEN CR) 12.5 MG CR tablet Take 1 tablet (12.5 mg total) by mouth at bedtime as needed for sleep. 05/28/19   Nicholas Lose, MD    Allergies    Statins, Sumatriptan, and Tape  Review of Systems   Review of Systems  Constitutional: Positive for chills, fatigue and fever.  HENT: Negative for ear pain and sore throat.   Eyes: Negative for pain and visual disturbance.  Respiratory: Negative for cough and shortness of breath.   Cardiovascular: Negative for chest pain and palpitations.  Gastrointestinal: Positive for nausea. Negative for abdominal pain and vomiting.  Genitourinary: Negative for dysuria and hematuria.  Musculoskeletal: Positive for arthralgias and myalgias. Negative for back pain.  Skin: Negative for color change and rash.  Neurological: Positive for headaches. Negative for seizures and syncope.  All other systems reviewed and are negative.   Physical Exam Updated Vital Signs BP 109/81 (BP Location: Right Arm)   Pulse 98   Temp (!) 101.5 F (38.6 C) (Oral)    Resp (!) 22   SpO2 98%   Physical Exam Vitals and nursing note reviewed.  Constitutional:      Appearance: She is well-developed.     Comments: Chronically ill-appearing but in no acute distress  HENT:     Head: Normocephalic and atraumatic.  Eyes:     Conjunctiva/sclera: Conjunctivae normal.  Cardiovascular:     Rate and Rhythm: Normal rate and regular rhythm.     Heart sounds: No murmur heard.   Pulmonary:     Comments: Mild tachypnea, no significant increased work of breathing, no accessory muscle use Abdominal:     General: There is no distension.     Palpations: Abdomen is soft.     Tenderness: There is no abdominal tenderness. There is no guarding.  Musculoskeletal:  General: No deformity or signs of injury.     Cervical back: Neck supple.  Skin:    General: Skin is warm and dry.     Capillary Refill: Capillary refill takes less than 2 seconds.  Neurological:     General: No focal deficit present.  Psychiatric:        Mood and Affect: Mood normal.     ED Results / Procedures / Treatments   Labs (all labs ordered are listed, but only abnormal results are displayed) Labs Reviewed  CULTURE, BLOOD (ROUTINE X 2)  CULTURE, BLOOD (ROUTINE X 2)  URINE CULTURE  SARS CORONAVIRUS 2 BY RT PCR (HOSPITAL ORDER, [facility])  LACTIC ACID, PLASMA  LACTIC ACID, PLASMA  COMPREHENSIVE METABOLIC PANEL  CBC WITH DIFFERENTIAL/PLATELET  URINALYSIS, ROUTINE W REFLEX MICROSCOPIC  PROCALCITONIN  I-STAT BETA HCG BLOOD, ED ([facility], [facility], [facility] ONLY)    EKG EKG Interpretation  Date/Time:  Monday July 22 2019 14:30:49 EDT Ventricular Rate:  100 PR Interval:    QRS Duration: 88 QT Interval:  347 QTC Calculation: 448 R Axis:   -23 Text Interpretation: Sinus tachycardia Borderline left axis deviation Minimal ST elevation, inferior leads Confirmed by Madalyn Rob [phone]) on 07/22/2019 2:59:22 PM   Radiology No results found.  Procedures Procedures  (including critical care time)  Medications Ordered in ED Medications  ceFEPIme (MAXIPIME) 2 g in sodium chloride 0.9 % 100 mL IVPB (has no administration in time range)  acetaminophen (TYLENOL) tablet 650 mg (has no administration in time range)  ondansetron (ZOFRAN) injection 4 mg (has no administration in time range)  vancomycin (VANCOREADY) IVPB 1750 mg/350 mL (has no administration in time range)    ED Course  I have reviewed the triage vital signs and the nursing notes.  Pertinent labs & imaging results that were available during my care of the patient were reviewed by me and considered in my medical decision making (see chart for details).  Clinical Course as of Jul 21 1509  Mon Jul 22, 2019  1430 Completed initial assessment, ordered labs to check covid, abx for pos neutropenic fever, RN at bedside accessing port, d/w RN regarding plan   [RD]  1510 Signed out to [facility]   [RD]    Clinical Course User Index [RD] Lucrezia Starch, MD   MDM Rules/Calculators/A&P                          44 year old lady metastatic breast cancer currently on chemotherapy presents to ER with fever, Covid exposure.  Given she is currently on chemotherapy and has potential for neutropenia and has fever, will treat empirically with broad-spectrum antibiotics and obtain blood cultures.  Suspect most likely patient has COVID-19, will check Covid swab, chest x-ray, labs to further investigate.  Given my highest clinical suspicion is for COVID-19, less likely sepsis, will hold off on any significant fluid bolus until Covid and lactate have resulted.     While awaiting work-up, patient signed out to Dr. Roderic Palau.  Final plan, disposition pending work-up and reassessment.  Final Clinical Impression(s) / ED Diagnoses Final diagnoses:  Malignant neoplasm of female breast, unspecified estrogen receptor status, unspecified laterality, unspecified site of breast ([place])  Fever, unspecified fever cause    Suspected COVID-19 virus infection    Rx / DC Orders ED Discharge Orders    None       Lucrezia Starch, MD 07/22/19 1511

## 2019-07-23 DIAGNOSIS — Z20822 Contact with and (suspected) exposure to covid-19: Secondary | ICD-10-CM

## 2019-07-23 LAB — CBC WITH DIFFERENTIAL/PLATELET
Abs Immature Granulocytes: 0.04 10*3/uL (ref 0.00–0.07)
Basophils Absolute: 0 10*3/uL (ref 0.0–0.1)
Basophils Relative: 0 %
Eosinophils Absolute: 0 10*3/uL (ref 0.0–0.5)
Eosinophils Relative: 0 %
HCT: 36.3 % (ref 36.0–46.0)
Hemoglobin: 11.6 g/dL — ABNORMAL LOW (ref 12.0–15.0)
Immature Granulocytes: 1 %
Lymphocytes Relative: 11 %
Lymphs Abs: 0.5 10*3/uL — ABNORMAL LOW (ref 0.7–4.0)
MCH: 32.5 pg (ref 26.0–34.0)
MCHC: 32 g/dL (ref 30.0–36.0)
MCV: 101.7 fL — ABNORMAL HIGH (ref 80.0–100.0)
Monocytes Absolute: 0.2 10*3/uL (ref 0.1–1.0)
Monocytes Relative: 5 %
Neutro Abs: 3.6 10*3/uL (ref 1.7–7.7)
Neutrophils Relative %: 83 %
Platelets: 118 10*3/uL — ABNORMAL LOW (ref 150–400)
RBC: 3.57 MIL/uL — ABNORMAL LOW (ref 3.87–5.11)
RDW: 22.5 % — ABNORMAL HIGH (ref 11.5–15.5)
WBC: 4.4 10*3/uL (ref 4.0–10.5)
nRBC: 0 % (ref 0.0–0.2)

## 2019-07-23 LAB — COMPREHENSIVE METABOLIC PANEL
ALT: 19 U/L (ref 0–44)
AST: 22 U/L (ref 15–41)
Albumin: 3 g/dL — ABNORMAL LOW (ref 3.5–5.0)
Alkaline Phosphatase: 118 U/L (ref 38–126)
Anion gap: 10 (ref 5–15)
BUN: 7 mg/dL (ref 6–20)
CO2: 23 mmol/L (ref 22–32)
Calcium: 7 mg/dL — ABNORMAL LOW (ref 8.9–10.3)
Chloride: 104 mmol/L (ref 98–111)
Creatinine, Ser: 0.59 mg/dL (ref 0.44–1.00)
GFR calc Af Amer: >60 mL/min (ref >60)
GFR calc non Af Amer: >60 mL/min (ref >60)
Glucose, Bld: 100 mg/dL — ABNORMAL HIGH (ref 70–99)
Potassium: 3.7 mmol/L (ref 3.5–5.1)
Sodium: 137 mmol/L (ref 135–145)
Total Bilirubin: 0.7 mg/dL (ref 0.3–1.2)
Total Protein: 6.4 g/dL — ABNORMAL LOW (ref 6.5–8.1)

## 2019-07-23 LAB — CBG MONITORING, ED
Glucose-Capillary: 104 mg/dL — ABNORMAL HIGH (ref 70–99)
Glucose-Capillary: 78 mg/dL (ref 70–99)
Glucose-Capillary: 83 mg/dL (ref 70–99)

## 2019-07-23 LAB — LACTIC ACID, PLASMA
Lactic Acid, Venous: 0.5 mmol/L (ref 0.5–1.9)
Lactic Acid, Venous: 0.6 mmol/L (ref 0.5–1.9)

## 2019-07-23 LAB — MAGNESIUM: Magnesium: 2 mg/dL (ref 1.7–2.4)

## 2019-07-23 LAB — PHOSPHORUS: Phosphorus: 2.2 mg/dL — ABNORMAL LOW (ref 2.5–4.6)

## 2019-07-23 LAB — C-REACTIVE PROTEIN: CRP: 7.1 mg/dL — ABNORMAL HIGH (ref <1.0)

## 2019-07-23 LAB — D-DIMER, QUANTITATIVE: D-Dimer, Quant: 2.78 ug/mL-FEU — ABNORMAL HIGH (ref 0.00–0.50)

## 2019-07-23 LAB — FERRITIN: Ferritin: 118 ng/mL (ref 11–307)

## 2019-07-23 MED ORDER — PANTOPRAZOLE SODIUM 40 MG PO TBEC
40.0000 mg | DELAYED_RELEASE_TABLET | Freq: Every day | ORAL | Status: DC
  Administered 2019-07-24 – 2019-07-25 (×2): 40 mg via ORAL
  Filled 2019-07-23 (×2): qty 1

## 2019-07-23 MED ORDER — POLYETHYLENE GLYCOL 3350 17 G PO PACK: 17.0000 g | PACK | Freq: Every day | ORAL | Status: DC | PRN

## 2019-07-23 MED ORDER — ENOXAPARIN SODIUM 80 MG/0.8ML ~~LOC~~ SOLN
80.0000 mg | SUBCUTANEOUS | Status: AC
  Administered 2019-07-23: 80 mg via SUBCUTANEOUS
  Filled 2019-07-23: qty 0.8

## 2019-07-23 MED ORDER — VENLAFAXINE HCL ER 150 MG PO CP24
150.0000 mg | ORAL_CAPSULE | Freq: Every day | ORAL | Status: DC
  Administered 2019-07-23 – 2019-07-25 (×3): 150 mg via ORAL
  Filled 2019-07-23 (×2): qty 1
  Filled 2019-07-23: qty 2

## 2019-07-23 MED ORDER — ENOXAPARIN SODIUM 80 MG/0.8ML ~~LOC~~ SOLN
80.0000 mg | Freq: Two times a day (BID) | SUBCUTANEOUS | Status: DC
  Administered 2019-07-24 – 2019-07-25 (×3): 80 mg via SUBCUTANEOUS
  Filled 2019-07-23 (×4): qty 0.8

## 2019-07-23 MED ORDER — LEVOTHYROXINE SODIUM 88 MCG PO TABS
88.0000 ug | ORAL_TABLET | Freq: Every day | ORAL | Status: DC
  Administered 2019-07-23 – 2019-07-25 (×3): 88 ug via ORAL
  Filled 2019-07-23 (×3): qty 1

## 2019-07-23 MED ORDER — PANTOPRAZOLE SODIUM 40 MG PO TBEC: 40.0000 mg | DELAYED_RELEASE_TABLET | Freq: Every day | ORAL | Status: DC

## 2019-07-23 MED ORDER — INSULIN ASPART 100 UNIT/ML ~~LOC~~ SOLN
0.0000 [IU] | Freq: Three times a day (TID) | SUBCUTANEOUS | Status: DC
  Filled 2019-07-23: qty 0.15

## 2019-07-23 NOTE — ED Notes (Signed)
Pt assisted to transfer to hospital bed, standby assistance.  Pt also provided with breakfast tray. No other needs expressed, will continue to monitor.

## 2019-07-23 NOTE — ED Notes (Signed)
Hospitalist made aware of temp.  Directed to give 650mg  Tylenol.

## 2019-07-23 NOTE — Progress Notes (Signed)
Brief Oncology Note:  Patient not seen due to COVID-19 positivity. Chart has been reviewed.   Laura Anthony presented to the emergency room with fever, cough, diarrhea, and fatigue.  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  The patient was exposed to a child who tested positive for Covid.  The patient has received her Pfizer Covid vaccines.  However, the patient tested positive for Covid this admission.  The patient is requiring supplemental oxygen due to desaturations into the 80s.  She is receiving antitussive medications as well as empiric vitamins.  She is receiving inhalers and a 5-day course of remdesivir. Steroids are currently being held due to concern for ONJ.  The patient is currently scheduled for her next cycle of chemotherapy on 07/25/2019 and this will need to be canceled due to her acute illness.  CT chest, abdomen, pelvis performed on admission has been reviewed.  This showed numerous stable bilateral noncalcified lung nodules, a very small left pleural effusion, and stable lytic appearing lesions within T12, L4, and S1.  CBC from admission has been reviewed which showed a normal WBC, hemoglobin down to 6.8, and platelet count 139,000.  Stool for occult blood was negative.  The patient has received 2 units PRBCs.  Her hemoglobin has improved to 11.6 this morning.   Recommendations: 1.  Continue treatment of COVID-19 infection per hospitalist. 2.  Anemia is likely related to recent chemotherapy.  I do not see any evidence of hemolysis on her lab work.  No obvious source of bleeding noted.  Recommend PRBC transfusion if hemoglobin drops below 8. 3.  Platelet count trending downward.  Thrombocytopenia likely due to recent chemotherapy.  No transfusion indicated at this time.  Transfuse if platelet count drops below 20,000 or active bleeding. 4.  We will need to cancel her chemotherapy this week and reschedule  once she recovers from her acute illness. 5.  Anticoagulation is currently on hold due anemia and concern for bleeding.  The patient has a history of multiple DVTs.  She was previously on apixaban as an outpatient.  Given the increased risk for hypercoagulability due to underlying cancer and COVID-19 infection, would recommend placing the patient on enoxaparin given no obvious source of bleeding.  Monitor hemoglobin very closely. 6.  Continue antibiotics for ONJ.  She will need outpatient follow-up with a dentist when she has recovered from COVID-19.

## 2019-07-23 NOTE — Progress Notes (Signed)
PT demonstrated hands on understanding of Flutter device- strong, non productive cough at this time.

## 2019-07-23 NOTE — Progress Notes (Signed)
ANTICOAGULATION CONSULT NOTE - Initial Consult  Pharmacy Consult for Lovenox Indication: hx DVTs  Allergies  Allergen Reactions  . Statins Other (See Comments)    Leg pain  . Sumatriptan Other (See Comments)    Numbness to face   . Tape Rash    Rash from dressing over port-a-cath     Patient Measurements:    Vital Signs: Temp: 100 F (37.8 C) (06/22 1139) Temp Source: Oral (06/22 1139) BP: 105/65 (06/22 1444) Pulse Rate: 89 (06/22 1444)  Labs: Recent Labs    07/22/19 1452 07/23/19 0438  HGB 6.8* 11.6*  HCT 21.8* 36.3  PLT 139* 118*  CREATININE 0.68 0.59    Estimated Creatinine Clearance: 98.2 mL/min (by C-G formula based on SCr of 0.59 mg/dL).   Medical History: Past Medical History:  Diagnosis Date  . ADD (attention deficit disorder)   . Anemia   . Anxiety   . Breast cancer, left breast ([place])    S/P mastectomy 03/27/2017  . DVT (deep venous thrombosis) ([place]) 2017   calf left - probably due to [facility] pills-took eliquis x3 mos, nonthing now  . High cholesterol   . Impingement syndrome of right shoulder 07/2013  . Migraine    "usually 1/month" (03/28/2017)  . PONV (postoperative nausea and vomiting)   . Right bicipital tenosynovitis 07/2013  . Rotator cuff impingement syndrome of right shoulder 07/12/2013  . Seizures ([place])    x 1 as a child - was never on anticonvulsants (03/28/2017)    Medications:  Eliquis 5mg  PO BID PTA  Assessment: 44 yo F with metastatic breast cancer, COVID PNA and hx of multiple DVTs on Eliquis at home.   She presents with pancytopenia likely from chemotherapy.   Hg on admit 6.8 improved to 11.6 today after PRBCs.  Oncology recommends Lovenox given high risk for hypercoagulability until Hg stabilizes.    Goal of Therapy:  Anti-Xa level 0.6-1 units/ml 4hrs after LMWH dose given Monitor platelets by anticoagulation protocol: Yes   Plan:  Lovenox 80mg  SQ BID CBC at least q72h while on Lovenox Monitor closely for s/sx of  bleeding  Netta Cedars PharmD 07/23/2019,3:43 PM

## 2019-07-23 NOTE — Progress Notes (Addendum)
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  She was placed on Augmentin.  A few nights later she began to feel lethargic and the next night she began spiking fevers.  This was followed shortly thereafter with diarrhea.  She had a known exposure to a COVID positive child.  She did receive her Pfizer Covid vaccine.  She presented to the ED 6/21 where she was found to be COVID positive.  A CXR suggested pulmonary infiltrates consistent with Covid pneumonia.  Significant Events: 6/21 admit via [facility] ED  Antimicrobials:  Augmentin 6/18 >  Covid Specific Treatment Remdesivir 6/21 >  Subjective: The patient states she is beginning to feel significantly better.  She reports coughing with phlegm but no severe shortness of breath.  She denies severe chest pain.  No nausea or vomiting.  Poor appetite.  Assessment & Plan:  COVID+ - Possible Pneumonia - status post Pfizer vaccine Continue remdesivir for an intended 5-day course -avoiding steroids at present given the patient's jaw osteonecrosis and unclear if she has a true PNA or not - not a candidate for Actemra should she worsen given her clinical situation/complications - CT chest w/o evidence of a true PNA - wean O2 as able - IS - mobilize - atx may be playing a big role here  Osteonecrosis of the jaw Continue Augmentin as prescribed prior to this admission -this is likely the etiology of her diarrhea, versus Covid, versus both -follow for now  Metastatic breast cancer -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, bone, brain Status post bilateral mastectomies, XRT, ongoing chemotherapy - scheduled for next  chemotherapy treatment 6/24 -this treatment is being canceled -oncology actively following patient's care  Acute on chronic anemia Felt to be a consequence of her active chemotherapy -no evidence of active blood loss -monitor -status post 2 units PRBC -resume anticoagulation for known DVTs  Macrocytosis Check S85 and folic acid levels  Thrombocytopenia Another consequence of her active chemotherapy treatment  Hypokalemia Corrected with supplementation  Upper and lower extremity DVTs On Eliquis at home -dosed with Lovenox for now until clear hemoglobin is stable  Hypothyroidism Continue usual home medical regimen  Chronic pain Monitor with usual home regimen and additional as needed as needed  Depression and anxiety Well compensated at present  DVT prophylaxis: SCDs Code Status: FULL CODE Family Communication: No family present at time of exam  Status is: Inpatient  Remains inpatient appropriate because:Inpatient level of care appropriate due to severity of illness   Dispo: The patient is from: Home              Anticipated d/c is to: Home              Anticipated d/c date is: 3 days              Patient currently is not medically stable to d/c.   Consultants:  Oncology  Objective: Blood pressure 99/60, pulse 78, temperature (!) 101.3 F (38.5 C), temperature source Oral, resp. rate 14, SpO2 100 %.  Intake/Output Summary (Last 24 hours) at 07/23/2019 0915 Last data filed at 07/23/2019 0334 Gross per 24 hour  Intake 4455.7 ml  Output 1000 ml  Net 3455.7 ml  There were no vitals filed for this visit.  Examination: General: No acute respiratory distress Lungs: Mild bibasilar crackles left greater than right with no wheezing Cardiovascular: Regular rate and rhythm without murmur gallop or rub normal S1 and S2 Abdomen: Nontender, nondistended, soft, bowel sounds positive, no rebound, no ascites, no appreciable mass Extremities: No significant cyanosis, clubbing,  or edema bilateral lower extremities  CBC: Recent Labs  Lab 07/19/19 0814 07/22/19 1452 07/23/19 0438  WBC 9.1 8.7 4.4  NEUTROABS 6.3 6.6 3.6  HGB 10.5* 6.8* 11.6*  HCT 33.0* 21.8* 36.3  MCV 105.1* 111.8* 101.7*  PLT 144* 139* 329*   Basic Metabolic Panel: Recent Labs  Lab 07/16/19 1235 07/16/19 1235 07/19/19 0840 07/22/19 1452 07/23/19 0438  NA 140   < > 140 138 137  K 3.5   < > 4.0 2.9* 3.7  CL 102   < > 101 107 104  CO2 28   < > 28 23 23   GLUCOSE 121*   < > 88 129* 100*  BUN 11   < > 9 12 7   CREATININE 0.81   < > 0.80 0.68 0.59  CALCIUM 8.8*   < > 9.3 7.6* 7.0*  MG 2.1  --   --   --  2.0  PHOS 3.6  --   --   --  2.2*   < > = values in this interval not displayed.   GFR: Estimated Creatinine Clearance: 98.2 mL/min (by C-G formula based on SCr of 0.59 mg/dL).  Liver Function Tests: Recent Labs  Lab 07/16/19 1235 07/19/19 0840 07/22/19 1452 07/23/19 0438  AST 23 22 19 22   ALT 23 28 21 19   ALKPHOS 222* 224* 129* 118  BILITOT <0.2* 0.3 0.5 0.7  PROT 7.0 7.1 6.4* 6.4*  ALBUMIN 3.2* 3.2* 3.3* 3.0*    CBG: Recent Labs  Lab 07/22/19 2359 07/23/19 0851  GLUCAP 104* 78    Recent Results (from the past 240 hour(s))  Culture, Blood     Status: None (Preliminary result)   Collection Time: 07/19/19  8:14 AM   Specimen: BLOOD RIGHT ARM  Result Value Ref Range Status   Specimen Description   Final    BLOOD RIGHT ARM Performed at [facility], [place] [address] [place], [place] [address]    Special Requests   Final    NONE Performed at [facility], [place] [address], [place], [place] [address]    Culture   Final    NO GROWTH 3 DAYS Performed at [facility], [place] [address], [place], [place] [address]    Report Status PENDING  Incomplete  Culture, Blood     Status: None (Preliminary result)   Collection Time: 07/19/19  8:22 AM   Specimen: BLOOD  Result Value Ref Range Status   Specimen Description    Final    BLOOD PORTA CATH Performed at [place] [address] [place], [place] [address]    Special Requests   Final    NONE Performed at [facility], [place] [address], [place], [place] [address]    Culture   Final    NO GROWTH 3 DAYS Performed at [facility], [place] [address], [place],  [address]    Report Status PENDING  Incomplete  SARS Coronavirus 2 by RT PCR (hospital order, performed in [facility] hospital lab) Nasopharyngeal Nasopharyngeal Swab     Status: Abnormal  Collection Time: 07/22/19  2:54 PM   Specimen: Nasopharyngeal Swab  Result Value Ref Range Status   SARS Coronavirus 2 POSITIVE (A) NEGATIVE Final    Comment: RESULT CALLED TO, READ BACK BY AND VERIFIED WITH: DOWD, P. RN @1551  07/22/19 BILLINGSLEY,L (NOTE) SARS-CoV-2 target nucleic acids are DETECTED  SARS-CoV-2 RNA is generally detectable in upper respiratory specimens  during the acute phase of infection.  Positive results are indicative  of the presence of the identified virus, but do not rule out bacterial infection or co-infection with other pathogens not detected by the test.  Clinical correlation with patient history and  other diagnostic information is necessary to determine patient infection status.  The expected result is negative.  Fact Sheet for Patients:   StrictlyIdeas.no   Fact Sheet for Healthcare Providers:   BankingDealers.co.za    This test is not yet approved or cleared by the [place] FDA and  has been authorized for detection and/or diagnosis of SARS-CoV-2 by FDA under an Emergency Use Authorization (EUA).  This EUA will remain in effect (meaning th is test can be used) for the duration of  the COVID-19 declaration under Section 564(b)(1) of the Act, 21 U.S.C. section 360-bbb-3(b)(1), unless the authorization is terminated or revoked sooner.  Performed at [facility], [place] [address] [place],  [address]      Scheduled Meds: . amoxicillin-clavulanate  1 tablet Oral BID  . vitamin C  500 mg Oral Daily  . insulin aspart  0-15 Units Subcutaneous TID WC  . insulin aspart  0-5 Units Subcutaneous QHS  . Ipratropium-Albuterol  1 puff Inhalation Q6H  . levothyroxine  88 mcg Oral QAC breakfast  . loratadine  10 mg Oral QHS  . methadone  10 mg Oral Q12H  . pantoprazole (PROTONIX) IV  40 mg Intravenous Q12H  . sodium chloride  1-2 spray Each Nare Q2H  . venlafaxine XR  150 mg Oral Q breakfast  . zinc sulfate  220 mg Oral Daily   Continuous Infusions: . remdesivir 100 mg in NS 100 mL       LOS: 1 day   Cherene Altes, MD [facility] Office  [phone] Pager - Text Page per Shea Evans  If 7PM-7AM, please contact night-coverage per Amion 07/23/2019, 9:15 AM

## 2019-07-23 NOTE — ED Notes (Signed)
Pt's oxygen level drops into low 80s when resting placed on 2L [place].

## 2019-07-24 ENCOUNTER — Other Ambulatory Visit: Payer: Self-pay | Admitting: *Deleted

## 2019-07-24 DIAGNOSIS — C7931 Secondary malignant neoplasm of brain: Secondary | ICD-10-CM

## 2019-07-24 LAB — COMPREHENSIVE METABOLIC PANEL
ALT: 17 U/L (ref 0–44)
AST: 19 U/L (ref 15–41)
Albumin: 2.7 g/dL — ABNORMAL LOW (ref 3.5–5.0)
Alkaline Phosphatase: 98 U/L (ref 38–126)
Anion gap: 6 (ref 5–15)
BUN: 6 mg/dL (ref 6–20)
CO2: 28 mmol/L (ref 22–32)
Calcium: 6.9 mg/dL — ABNORMAL LOW (ref 8.9–10.3)
Chloride: 105 mmol/L (ref 98–111)
Creatinine, Ser: 0.6 mg/dL (ref 0.44–1.00)
GFR calc Af Amer: >60 mL/min (ref >60)
GFR calc non Af Amer: >60 mL/min (ref >60)
Glucose, Bld: 96 mg/dL (ref 70–99)
Potassium: 3.3 mmol/L — ABNORMAL LOW (ref 3.5–5.1)
Sodium: 139 mmol/L (ref 135–145)
Total Bilirubin: 0.3 mg/dL (ref 0.3–1.2)
Total Protein: 5.8 g/dL — ABNORMAL LOW (ref 6.5–8.1)

## 2019-07-24 LAB — CBC WITH DIFFERENTIAL/PLATELET
Abs Immature Granulocytes: 0.02 10*3/uL (ref 0.00–0.07)
Basophils Absolute: 0 10*3/uL (ref 0.0–0.1)
Basophils Relative: 0 %
Eosinophils Absolute: 0 10*3/uL (ref 0.0–0.5)
Eosinophils Relative: 0 %
HCT: 35.3 % — ABNORMAL LOW (ref 36.0–46.0)
Hemoglobin: 10.9 g/dL — ABNORMAL LOW (ref 12.0–15.0)
Immature Granulocytes: 1 %
Lymphocytes Relative: 23 %
Lymphs Abs: 0.6 10*3/uL — ABNORMAL LOW (ref 0.7–4.0)
MCH: 31.6 pg (ref 26.0–34.0)
MCHC: 30.9 g/dL (ref 30.0–36.0)
MCV: 102.3 fL — ABNORMAL HIGH (ref 80.0–100.0)
Monocytes Absolute: 0.4 10*3/uL (ref 0.1–1.0)
Monocytes Relative: 14 %
Neutro Abs: 1.6 10*3/uL — ABNORMAL LOW (ref 1.7–7.7)
Neutrophils Relative %: 62 %
Platelets: 108 10*3/uL — ABNORMAL LOW (ref 150–400)
RBC: 3.45 MIL/uL — ABNORMAL LOW (ref 3.87–5.11)
RDW: 22.3 % — ABNORMAL HIGH (ref 11.5–15.5)
WBC: 2.7 10*3/uL — ABNORMAL LOW (ref 4.0–10.5)
nRBC: 0 % (ref 0.0–0.2)

## 2019-07-24 LAB — TYPE AND SCREEN
ABO/RH(D): A POS
Antibody Screen: NEGATIVE
Unit division: 0
Unit division: 0

## 2019-07-24 LAB — BPAM RBC
Blood Product Expiration Date: 202107062359
Blood Product Expiration Date: 202107062359
ISSUE DATE / TIME: 202106211954
ISSUE DATE / TIME: 202106220021
Unit Type and Rh: 6200
Unit Type and Rh: 6200

## 2019-07-24 LAB — FERRITIN: Ferritin: 173 ng/mL (ref 11–307)

## 2019-07-24 LAB — CULTURE, BLOOD (SINGLE)
Culture: NO GROWTH
Culture: NO GROWTH

## 2019-07-24 LAB — URINE CULTURE: Culture: NO GROWTH

## 2019-07-24 LAB — D-DIMER, QUANTITATIVE: D-Dimer, Quant: 2.45 ug/mL-FEU — ABNORMAL HIGH (ref 0.00–0.50)

## 2019-07-24 LAB — C-REACTIVE PROTEIN: CRP: 5 mg/dL — ABNORMAL HIGH (ref <1.0)

## 2019-07-24 LAB — MAGNESIUM: Magnesium: 2.3 mg/dL (ref 1.7–2.4)

## 2019-07-24 LAB — PHOSPHORUS: Phosphorus: 2.1 mg/dL — ABNORMAL LOW (ref 2.5–4.6)

## 2019-07-24 MED ORDER — SODIUM CHLORIDE 0.9% FLUSH
10.0000 mL | INTRAVENOUS | Status: DC | PRN
  Administered 2019-07-25: 10 mL

## 2019-07-24 MED ORDER — POTASSIUM CHLORIDE CRYS ER 20 MEQ PO TBCR
40.0000 meq | EXTENDED_RELEASE_TABLET | Freq: Once | ORAL | Status: AC
  Administered 2019-07-24: 40 meq via ORAL
  Filled 2019-07-24: qty 2

## 2019-07-24 MED ORDER — ZOLPIDEM TARTRATE 5 MG PO TABS
5.0000 mg | ORAL_TABLET | Freq: Every evening | ORAL | Status: AC | PRN
  Administered 2019-07-24: 5 mg via ORAL
  Filled 2019-07-24: qty 1

## 2019-07-24 MED ORDER — CHLORHEXIDINE GLUCONATE CLOTH 2 % EX PADS
6.0000 | MEDICATED_PAD | Freq: Every day | CUTANEOUS | Status: DC
  Administered 2019-07-24 – 2019-07-25 (×2): 6 via TOPICAL

## 2019-07-24 NOTE — Evaluation (Signed)
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  She was placed on Augmentin.  A few nights later she began to feel lethargic and the next night she began spiking fevers.  This was followed shortly thereafter with diarrhea.  She had a known exposure to a COVID positive child.  She did receive her Pfizer Covid vaccine.  She presented to the ED 6/21 where she was found to be COVID positive.  A CXR suggested pulmonary infiltrates consistent with Covid pneumonia.   Clinical Impression   [name] is a 44 year old with metastatic breast cancer and currently COVID positive. Patient reports independence and walking 1 mile a day as her PLOF. On evaluation patient presents with functional strength, ability to perform functional mobility and ADLs, but significant decline in activity tolerance. Patient able to perform short activity/ambulation in room, approx 30 feet, with mild reports of fatigue but when performing second bout of activity after first - became very fatigued and increased respiratory effort though vital signs WNL during activity. Patient will benefit from skilled OT services to improve activity tolerance and cardiopulmonary endurance while in hospital in order for patient to return home at discharge. Would recommend [facility] PT or outpatient PT services at discharge to assist patient with returning to independence. No OT needs at discharge.    Follow Up Recommendations  No OT follow up    Equipment Recommendations  None recommended by OT    Recommendations for Other Services        Precautions / Restrictions Precautions Precautions: Fall Restrictions Weight Bearing Restrictions: No      Mobility Bed Mobility Overal bed mobility: Modified Independent             General bed mobility comments: HOB elevated, slightly increased time  Transfers Overall transfer level: Needs assistance Equipment used: None Transfers: Sit to/from [employer] Transfers Sit to Stand: Supervision Stand pivot transfers: Supervision       General transfer comment: slow to rise, BUE assisting to power up, no loss of balance or steadying assist. Patient ambulated in room and returned to seated position - approx 30 feet. O2 sats and HR WFL on RA. Patient reported mild fatigue. Therapist had patient perform second loop around room immediately after first - and patient reported significant fatigue and reaching for bed to stabilize herself. Patient required seated rest break. Again, vital signs WNL but patient reporting significant fatigue. Patient returned to bed to recover.    Balance Overall balance assessment: Mild deficits observed, not formally tested (Reported neuropathy in feet.) Sitting-balance support: Feet supported;No upper extremity supported Sitting balance-Leahy Scale: Good Sitting balance - Comments: seated EON   Standing balance support: During functional activity;Single extremity supported Standing balance-Leahy Scale: Fair Standing balance comment: with IV pole                           ADL either performed or assessed with clinical judgement   ADL Overall ADL's : Modified independent  General ADL Comments: Reports independence with ADLs; demonstrates ability to donn socks. Reports ambulating to bathroom without nursing assistance. Reports fatigue with activity.     Vision Patient Visual Report: No change from baseline Vision Assessment?: No apparent visual deficits     Perception      Praxis      Pertinent Vitals/Pain Pain Assessment: No/denies pain     Hand Dominance     Extremity/Trunk Assessment Upper Extremity Assessment Upper Extremity Assessment: Overall WFL for tasks assessed   Lower Extremity Assessment Lower Extremity Assessment: Defer to PT evaluation   Cervical / Trunk Assessment Cervical / Trunk Assessment: Normal   Communication Communication Communication: No difficulties   Cognition Arousal/Alertness: Awake/alert Behavior During Therapy: WFL for tasks assessed/performed Overall Cognitive Status: Within Functional Limits for tasks assessed                                     General Comments       Exercises     Shoulder Instructions      Home Living Family/patient expects to be discharged to:: Private residence Living Arrangements: Spouse/significant other;Children Available Help at Discharge: Family;Available 24 hours/day Type of Home: House Home Access: Stairs to enter [employer] of Steps: 1   Home Layout: Two level;Able to live on main level with bedroom/bathroom Alternate Level Stairs-Number of Steps: flight   Bathroom Shower/Tub: Walk-in shower         Home Equipment: Shower seat - built in;Walker - 2 wheels          Prior Functioning/Environment Level of Independence: Independent        Comments: Pt reports Ind at home with ADLs, ambulating community distances without AD, using instacart for grocery shopping, slipped on water in kitchen last week resulting in fall. Pt reports using RW when feeling "weak". Spouse works from home and oldest of 3 children has driver's license.        OT Problem List: Decreased activity tolerance;Impaired balance (sitting and/or standing)      OT Treatment/Interventions: Self-care/ADL training;Therapeutic exercise;Energy conservation;Patient/family education;Therapeutic activities    OT Goals(Current goals can be found in the care plan section) Acute  Rehab OT Goals Patient Stated Goal: Improve endurance OT Goal Formulation: With patient Time For Goal Achievement: 08/01/19 Potential to Achieve Goals: Good  OT Frequency: Min 2X/week   Barriers to D/C:            Co-evaluation              AM-PAC OT "6 Clicks" Daily Activity     Outcome Measure Help from another person eating meals?: None Help from another person taking care of personal grooming?: None Help from another person toileting, which includes using toliet, bedpan, or urinal?: None Help from another person bathing (including washing, rinsing, drying)?: None Help from another person to put on and taking off regular upper body clothing?: None Help from another person to put on and taking off regular lower body clothing?: None 6 Click Score: 24   End of Session Nurse Communication: Mobility status (Reported patient's fatigue with activity. Rec HH services.)  Activity Tolerance: Patient limited by fatigue Patient left: in bed;with call bell/phone within reach  OT Visit Diagnosis: Unsteadiness on feet (R26.81);Muscle weakness (generalized) (M62.81)                Time: 5053-9767 OT Time Calculation (min): 14 min Charges:  OT General  Charges $OT Visit: 1 Visit OT Evaluation $OT Eval Low Complexity: 1 Low  Erwin Nishiyama, OTR/L [place]  Office [phone] Pager: [place] 07/24/2019, 4:36 PM

## 2019-07-24 NOTE — Progress Notes (Signed)
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  She was placed on Augmentin.  A few nights later she began to feel lethargic and the next night she began spiking fevers.  This was followed shortly thereafter with diarrhea.  She had a known exposure to a COVID positive child. She presented to the ED 6/21 where she was found to be COVID positive.  A CXR suggested pulmonary infiltrates consistent with Covid pneumonia. Remdesivir was started and the patient was admitted requiring 2L O2.  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  - Continue remdesivir x5 days (6/21 - 6/25), would be eligible for outpatient infusion if hypoxemia resolved in next 24 hours. - Steroids being avoided in light of osteonecrosis and no worsening hypoxemia - Vitamin C, zinc - Encourage OOB, IS, FV, and awake proning if able - Tylenol and antitussives prn - Continue airborne, contact precautions for 21 days from positive testing. - Check CBC w/diff, CMP, CRP in AM. Inflammatory marker improving. - Enoxaparin therapeutic dose.  - Maintain euvolemia/net negative.  - Avoid NSAIDs   Jaw osteonecrosis:  - Continue augmentin and outpatient follow up.   Metastatic breast cancer -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, bone, brain Status post  bilateral mastectomies, XRT, ongoing chemotherapy - scheduled for next chemotherapy treatment 6/24 -this treatment is being canceled -oncology actively following patient's care  Chemotherapy-induced anemia:  - s/p 2u PRBCs with adequate response.  - Given risk/benefit, negative FOBT, restarted anticoagulation.   Macrocytosis Check D22 and folic acid levels  Thrombocytopenia due to chemotherapy:  - Transfuse if bleeding or <20k.   Hypokalemia:  - supplement and monitor  Upper and lower extremity DVTs On Eliquis at home -dosed with Lovenox for now until clear hemoglobin is stable  Hypothyroidism Continue usual home medical regimen  Chronic pain Monitor with usual home regimen and additional as needed as needed  Depression and anxiety Well compensated at present  DVT prophylaxis: Lovenox 1mg /kg q12h Code Status: Full Family Communication: None at bedside Disposition Plan:  Status is: Inpatient  Remains inpatient appropriate because:Inpatient level of care appropriate due to severity of illness   Dispo: The patient is from: Home              Anticipated d/c is to: Home              Anticipated d/c date is: 1 day              Patient currently is not medically stable to d/c.  Consultants:   Oncology  Procedures:   None  Antimicrobials:  Remdesivir   Subjective: Dyspnea with exertion is moderate, improved, constant improved with rest. No chest pain.   Objective: Vitals:   07/24/19 0544 07/24/19 0901 07/24/19 1000 07/24/19 1252  BP: 93/66  110/70 117/85  Pulse: 75  72 69  Resp: 16 18  20   Temp:  98.6 F (37 C)  98.1 F (36.7 C) 97.9 F (36.6 C)  TempSrc: Oral  Oral Oral  SpO2: 96%  96% 94%  Weight:      Height:        Intake/Output Summary (Last 24 hours) at 07/24/2019 1800 Last data filed at 07/24/2019 1700 Gross per 24 hour  Intake 1220 ml  Output 1675 ml  Net -455 ml   Filed Weights   07/24/19 0150  Weight: 79.3 kg   Gen: 44 y.o.  female in no distress Pulm: Non-labored breathing 2L O2. Clear to auscultation bilaterally.  CV: Regular rate and rhythm. No murmur, rub, or gallop. No JVD, no pedal edema. GI: Abdomen soft, non-tender, non-distended, with normoactive bowel sounds. No organomegaly or masses felt. Ext: Warm, no deformities Skin: No rashes, lesions or ulcers Neuro: Alert and oriented. No focal neurological deficits. Psych: Judgement and insight appear normal. Mood & affect appropriate.   Data Reviewed: I have personally reviewed following labs and imaging studies  CBC: Recent Labs  Lab 07/19/19 0814 07/22/19 1452 07/23/19 0438 07/24/19 0317  WBC 9.1 8.7 4.4 2.7*  NEUTROABS 6.3 6.6 3.6 1.6*  HGB 10.5* 6.8* 11.6* 10.9*  HCT 33.0* 21.8* 36.3 35.3*  MCV 105.1* 111.8* 101.7* 102.3*  PLT 144* 139* 118* 097*   Basic Metabolic Panel: Recent Labs  Lab 07/19/19 0840 07/22/19 1452 07/23/19 0438 07/24/19 0317  NA 140 138 137 139  K 4.0 2.9* 3.7 3.3*  CL 101 107 104 105  CO2 28 23 23 28   GLUCOSE 88 129* 100* 96  BUN 9 12 7 6   CREATININE 0.80 0.68 0.59 0.60  CALCIUM 9.3 7.6* 7.0* 6.9*  MG  --   --  2.0 2.3  PHOS  --   --  2.2* 2.1*   GFR: Estimated Creatinine Clearance: 98.3 mL/min (by C-G formula based on SCr of 0.6 mg/dL). Liver Function Tests: Recent Labs  Lab 07/19/19 0840 07/22/19 1452 07/23/19 0438 07/24/19 0317  AST 22 19 22 19   ALT 28 21 19 17   ALKPHOS 224* 129* 118 98  BILITOT 0.3 0.5 0.7 0.3  PROT 7.1 6.4* 6.4* 5.8*  ALBUMIN 3.2* 3.3* 3.0* 2.7*   No results for input(s): LIPASE, AMYLASE in the last 168 hours. No results for input(s): AMMONIA in the last 168 hours. Coagulation Profile: No results for input(s): INR, PROTIME in the last 168 hours. Cardiac Enzymes: No results for input(s): CKTOTAL, CKMB, CKMBINDEX, TROPONINI in the last 168 hours. BNP (last 3 results) No results for input(s): PROBNP in the last 8760 hours. HbA1C: No results for input(s): HGBA1C in the last 72  hours. CBG: Recent Labs  Lab 07/22/19 2359 07/23/19 0851 07/23/19 1134  GLUCAP 104* 78 83   Lipid Profile: Recent Labs    07/22/19 1452  TRIG 98   Thyroid Function Tests: No results for input(s): TSH, T4TOTAL, FREET4, T3FREE, THYROIDAB in the last 72 hours. Anemia Panel: Recent Labs    07/22/19 1452 07/22/19 1452 07/23/19 0438 07/24/19 0317  [id number] 4,266*  --   --   --   FOLATE 30.3  --   --   --   FERRITIN 112   < > 118 173  TIBC 373  --   --   --   IRON 26*  --   --   --   RETICCTPCT 1.9  --   --   --    < > = values in this interval not displayed.   Urine analysis:  Component Value Date/Time   COLORURINE YELLOW 07/22/2019 1900   APPEARANCEUR CLEAR 07/22/2019 1900   LABSPEC 1.010 07/22/2019 1900   PHURINE 5.0 07/22/2019 1900   GLUCOSEU NEGATIVE 07/22/2019 1900   HGBUR NEGATIVE 07/22/2019 1900   BILIRUBINUR NEGATIVE 07/22/2019 1900   KETONESUR NEGATIVE 07/22/2019 1900   PROTEINUR NEGATIVE 07/22/2019 1900   NITRITE NEGATIVE 07/22/2019 1900   LEUKOCYTESUR NEGATIVE 07/22/2019 1900   Recent Results (from the past 240 hour(s))  Culture, Blood     Status: None   Collection Time: 07/19/19  8:14 AM   Specimen: BLOOD RIGHT ARM  Result Value Ref Range Status   Specimen Description   Final    BLOOD RIGHT ARM Performed at [facility], [place] [address] [place], [place] [address]    Special Requests   Final    NONE Performed at [facility], [place] [address] [place], [place] [address]    Culture   Final    NO GROWTH 5 DAYS Performed at [facility], [place] [address], [place], [place] [address]    Report Status 07/24/2019 FINAL  Final  Culture, Blood     Status: None   Collection Time: 07/19/19  8:22 AM   Specimen: BLOOD  Result Value Ref Range Status   Specimen Description   Final    BLOOD PORTA CATH Performed at [place] [address], [place], [place] [address]    Special Requests    Final    NONE Performed at [facility], [place] [address], [place], [place] [address]    Culture   Final    NO GROWTH 5 DAYS Performed at [facility], [place] [address] [place], [place] [address]    Report Status 07/24/2019 FINAL  Final  Blood Culture (routine x 2)     Status: None (Preliminary result)   Collection Time: 07/22/19  2:54 PM   Specimen: BLOOD  Result Value Ref Range Status   Specimen Description   Final    BLOOD RIGHT PORTA CATH Performed at [place] [address], [place], [place] [address]    Special Requests   Final    BOTTLES DRAWN AEROBIC AND ANAEROBIC Blood Culture adequate volume Performed at [place] [address], [place], [place] [address]    Culture   Final    NO GROWTH 2 DAYS Performed at [place] [address], [place], [place] [address]    Report Status PENDING  Incomplete  Blood Culture (routine x 2)     Status: None (Preliminary result)   Collection Time: 07/22/19  2:54 PM   Specimen: BLOOD  Result Value Ref Range Status   Specimen Description   Final    BLOOD RIGHT ANTECUBITAL Performed at [place] [address] [place], [place] [address]    Special Requests   Final    BOTTLES DRAWN AEROBIC AND ANAEROBIC Blood Culture results may not be optimal due to an excessive volume of blood received in culture bottles Performed at [place] [address] [place], [place] [address]    Culture   Final    NO GROWTH 2 DAYS Performed at [place] [address] [place], [place] [address]    Report Status PENDING  Incomplete  SARS Coronavirus 2 by RT PCR (hospital order, performed in [facility] hospital lab) Nasopharyngeal Nasopharyngeal Swab  Status: Abnormal   Collection Time: 07/22/19  2:54 PM   Specimen: Nasopharyngeal Swab  Result Value Ref Range Status   SARS Coronavirus 2 POSITIVE (A) NEGATIVE  Final    Comment: RESULT CALLED TO, READ BACK BY AND VERIFIED WITH: DOWD, P. RN @1551  07/22/19 BILLINGSLEY,L (NOTE) SARS-CoV-2 target nucleic acids are DETECTED  SARS-CoV-2 RNA is generally detectable in upper respiratory specimens  during the acute phase of infection.  Positive results are indicative  of the presence of the identified virus, but do not rule out bacterial infection or co-infection with other pathogens not detected by the test.  Clinical correlation with patient history and  other diagnostic information is necessary to determine patient infection status.  The expected result is negative.  Fact Sheet for Patients:   StrictlyIdeas.no   Fact Sheet for Healthcare Providers:   BankingDealers.co.za    This test is not yet approved or cleared by the [place] FDA and  has been authorized for detection and/or diagnosis of SARS-CoV-2 by FDA under an Emergency Use Authorization (EUA).  This EUA will remain in effect (meaning th is test can be used) for the duration of  the COVID-19 declaration under Section 564(b)(1) of the Act, 21 U.S.C. section 360-bbb-3(b)(1), unless the authorization is terminated or revoked sooner.  Performed at [facility], [place] [address], [place], [place] [address]   Urine culture     Status: None   Collection Time: 07/22/19  7:00 PM   Specimen: In/Out Cath Urine  Result Value Ref Range Status   Specimen Description   Final    IN/OUT CATH URINE Performed at [place] [address] [place], [place] [address]    Special Requests   Final    NONE Performed at [facility], [place] [address], [place], [place] [address]    Culture   Final    NO GROWTH Performed at [facility], [place] [address] [place], [place] [address]    Report Status 07/24/2019 FINAL  Final      Radiology Studies: CT Chest W Contrast  Result Date:  07/22/2019 CLINICAL DATA:  Fever, cough and diarrhea. EXAM: CT CHEST, ABDOMEN, AND PELVIS WITH CONTRAST TECHNIQUE: Multidetector CT imaging of the chest, abdomen and pelvis was performed following the standard protocol during bolus administration of intravenous contrast. CONTRAST:  137mL OMNIPAQUE IOHEXOL 300 MG/ML  SOLN COMPARISON:  None. FINDINGS: CT CHEST FINDINGS Cardiovascular: No significant vascular findings. Normal heart size. No pericardial effusion. Mediastinum/Nodes: No enlarged mediastinal, hilar, or axillary lymph nodes. Thyroid gland, trachea, and esophagus demonstrate no significant findings. Lungs/Pleura: A stable 1.3 cm noncalcified lung nodule is seen within the posterolateral aspect of the left upper lobe. A stable 5 mm noncalcified lung nodule is seen within the posterior aspect of the right upper lobe. Numerous stable subcentimeter noncalcified lung nodules are seen within the left lower lobe, right middle lobe and right lower lobe. A stable 1.7 cm noncalcified lung nodule is seen within the posterior lateral aspect of the right middle lobe. Mild to moderate severity areas of linear scarring and/or atelectasis are again seen within the right middle lobe, left upper lobe and bilateral lung bases. There is a very small left pleural effusion. No pneumothorax is identified. Musculoskeletal: Bilateral breast implants are seen. A chronic posterior eighth right rib fracture is seen. A stable lytic appearing areas seen within the T12 vertebral body. CT ABDOMEN PELVIS FINDINGS Hepatobiliary: No focal liver abnormality is seen. No gallstones,  gallbladder wall thickening, or biliary dilatation. Pancreas: Unremarkable. No pancreatic ductal dilatation or surrounding inflammatory changes. Spleen: Normal in size without focal abnormality. Adrenals/Urinary Tract: Adrenal glands are unremarkable. Kidneys are normal, without renal calculi, focal lesion, or hydronephrosis. Bladder is unremarkable. Stomach/Bowel:  Stomach is within normal limits. The appendix is not clearly identified. No evidence of bowel wall thickening, distention, or inflammatory changes. Vascular/Lymphatic: No significant vascular findings are present. No enlarged abdominal or pelvic lymph nodes. Reproductive: Uterus and bilateral adnexa are unremarkable. Other: No abdominal wall hernia or abnormality. No abdominopelvic ascites. Musculoskeletal: A stable lytic appearing area is seen within the L4 vertebral body and S1 segment of the sacrum. IMPRESSION: 1. Numerous stable bilateral noncalcified lung nodules. 2. Very small left pleural effusion. 3. Stable lytic appearing lesions within the T12, L4 and S1 segment of the sacrum. Electronically Signed   By: Virgina Norfolk M.D.   On: 07/22/2019 23:13   CT ABDOMEN PELVIS W CONTRAST  Result Date: 07/22/2019 CLINICAL DATA:  Abdominal pain and fever. EXAM: CT CHEST, ABDOMEN, AND PELVIS WITH CONTRAST TECHNIQUE: Multidetector CT imaging of the chest, abdomen and pelvis was performed following the standard protocol during bolus administration of intravenous contrast. CONTRAST:  168mL OMNIPAQUE IOHEXOL 300 MG/ML  SOLN COMPARISON:  May 28, 2019 FINDINGS: CT CHEST FINDINGS Cardiovascular: No significant vascular findings. Normal heart size. No pericardial effusion. Mediastinum/Nodes: No enlarged mediastinal, hilar, or axillary lymph nodes. Thyroid gland, trachea, and esophagus demonstrate no significant findings. Lungs/Pleura: A stable 1.3 cm noncalcified lung nodule is seen within the posterolateral aspect of the left upper lobe. A stable 5 mm noncalcified lung nodule is seen within the posterior aspect of the right upper lobe. Numerous stable subcentimeter noncalcified lung nodules are seen within the left lower lobe, right middle lobe and right lower lobe. A stable 1.7 cm noncalcified lung nodule is seen within the posterior lateral aspect of the right middle lobe. Mild to moderate severity areas of linear  scarring and/or atelectasis are again seen within the right middle lobe, left upper lobe and bilateral lung bases. There is a very small left pleural effusion. No pneumothorax is identified. Musculoskeletal: Bilateral breast implants are seen. A chronic posterior eighth right rib fracture is seen. A stable lytic appearing areas seen within the T12 vertebral body. CT ABDOMEN PELVIS FINDINGS Hepatobiliary: No focal liver abnormality is seen. No gallstones, gallbladder wall thickening, or biliary dilatation. Pancreas: Unremarkable. No pancreatic ductal dilatation or surrounding inflammatory changes. Spleen: Normal in size without focal abnormality. Adrenals/Urinary Tract: Adrenal glands are unremarkable. Kidneys are normal, without renal calculi, focal lesion, or hydronephrosis. Bladder is unremarkable. Stomach/Bowel: Stomach is within normal limits. The appendix is not clearly identified. No evidence of bowel wall thickening, distention, or inflammatory changes. Vascular/Lymphatic: No significant vascular findings are present. No enlarged abdominal or pelvic lymph nodes. Reproductive: Uterus and bilateral adnexa are unremarkable. Other: No abdominal wall hernia or abnormality. No abdominopelvic ascites. Musculoskeletal: A stable lytic appearing area is seen within the L4 vertebral body and S1 segment of the sacrum. IMPRESSION: 1. Numerous stable bilateral noncalcified lung nodules. 2. Very small left pleural effusion. 3. Stable lytic appearing lesions within the T12, L4 and S1 segment of the sacrum. 4. Chronic posterior eighth right rib fracture. Electronically Signed   By: Virgina Norfolk M.D.   On: 07/22/2019 23:12    Scheduled Meds: . amoxicillin-clavulanate  1 tablet Oral BID  . vitamin C  500 mg Oral Daily  . Chlorhexidine Gluconate Cloth  6 each Topical  Daily  . enoxaparin (LOVENOX) injection  80 mg Subcutaneous Q12H  . Ipratropium-Albuterol  1 puff Inhalation Q6H  . levothyroxine  88 mcg Oral QAC  breakfast  . loratadine  10 mg Oral QHS  . methadone  10 mg Oral Q12H  . pantoprazole  40 mg Oral Daily  . venlafaxine XR  150 mg Oral Q breakfast  . zinc sulfate  220 mg Oral Daily   Continuous Infusions: . remdesivir 100 mg in NS 100 mL 100 mg (07/24/19 0825)     LOS: 2 days   Time spent: 25 minutes.  Patrecia Pour, MD [facility] [email or web address] 07/24/2019, 6:00 PM

## 2019-07-24 NOTE — TOC Progression Note (Signed)
Transition of Care [facility]) - Progression Note    Patient Details  Name: Laura Anthony MRN: [id number] Date of Birth: 1975-08-14  [facility] of [facility]) CM/SW Contact  Purcell Mouton, RN Phone Number: 07/24/2019, 9:56 AM  Clinical Narrative:    TOC will follow for HH/DME needs at discharge.    Expected Discharge Plan: Home/Self Care Barriers to Discharge: No Barriers Identified  Expected Discharge Plan and Services Expected Discharge Plan: Home/Self Care       Living arrangements for the past 2 months: Single Family Home                                       Social Determinants of Health (SDOH) Interventions    Readmission Risk Interventions Readmission Risk Prevention Plan 01/04/2019  Transportation Screening Complete  PCP or Specialist Appt within 3-5 Days Complete  HRI or [place] Not Complete  HRI or Home Care Consult comments NA  Social Work Consult for [place] Planning/Counseling Not Complete  SW consult not completed comments NA  Palliative Care Screening Not Applicable  Medication Review [employer] photographer) Complete  Some recent data might be hidden

## 2019-07-24 NOTE — Evaluation (Addendum)
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  She was placed on Augmentin.  A few nights later she began to feel lethargic and the next night she began spiking fevers.  This was followed shortly thereafter with diarrhea.  She had a known exposure to a COVID positive child.  She did receive her Pfizer Covid vaccine.  She presented to the ED 6/21 where she was found to be COVID positive.  A CXR suggested pulmonary infiltrates consistent with Covid pneumonia.  Clinical Impression  Pt admitted with above diagnosis. Pt slightly below baseline requiring min G with ambulation with fatigue. Pt saturating well on RA with ambulation, limited secondary to weakness and fatigue with ambulation. Pt requiring IV pole for steadying with gait at this time and baseline without AD or using RW when fatigued due to chemo at home.  Pt with good family support and all equipment needed at home. Pt currently with functional limitations due to the deficits listed below (see PT Problem List). Pt will benefit from skilled PT to increase their independence and safety with mobility to allow discharge to the venue listed below.       Follow Up Recommendations Home health PT    Equipment Recommendations  None recommended by PT    Recommendations for Other Services       Precautions / Restrictions Precautions Precautions: Fall Restrictions Weight Bearing Restrictions: No      Mobility  Bed Mobility Overal bed mobility: Modified Independent  General bed mobility comments: HOB elevated, slightly increased  time  Transfers Overall transfer level: Needs assistance Equipment used: None Transfers: Sit to/from Stand;Stand Pivot Transfers Sit to Stand: Supervision Stand pivot transfers: Supervision  General transfer comment: slow to rise, BUE assisting to power up, no loss of balance or steadying assist  Ambulation/Gait Ambulation/Gait assistance: Supervision;Min guard Gait Distance (Feet): 24 Feet Assistive device: IV Pole Gait Pattern/deviations: Step-through pattern;Decreased stride length Gait velocity: decreased   General Gait Details: slow steps in room using IV pole to steady self, min G with fatigue requiring some steadying assistance, dizziness with ambulation that resolves with seated rest break, no increased work of breathing, on RA with SpO2 >92%  Stairs            Wheelchair Mobility    Modified Rankin (Stroke Patients Only)       Balance Overall balance assessment: Needs assistance Sitting-balance support: Feet supported;No upper extremity supported Sitting balance-Leahy Scale: Good Sitting balance - Comments: seated EON   Standing balance support: During functional activity;Single extremity supported Standing balance-Leahy Scale: Fair Standing balance comment: with IV pole          Pertinent Vitals/Pain      Home Living                        Prior Function                 Hand Dominance        Extremity/Trunk Assessment   Upper Extremity Assessment Upper Extremity Assessment: Defer to OT evaluation    Lower Extremity Assessment Lower Extremity Assessment: Overall WFL for tasks assessed (AROM WNL, strength 4/5,  reports numbness/tingling throughout BLE from chemo)    Cervical / Trunk Assessment Cervical / Trunk Assessment: Normal  Communication      Cognition                         General Comments      Exercises     Assessment/Plan    PT Assessment Patient needs continued PT services  PT Problem List  Decreased strength;Decreased range of motion;Decreased activity tolerance;Decreased balance;Decreased mobility;Cardiopulmonary status limiting activity       PT Treatment Interventions DME instruction;Gait training;Functional mobility training;Therapeutic activities;Therapeutic exercise;Balance training;Patient/family education    PT Goals (Current goals can be found in the Care Plan section)  Acute Rehab PT Goals Patient Stated Goal: return home PT Goal Formulation: With patient Time For Goal Achievement: 07/31/19 Potential to Achieve Goals: Good    Frequency Min 3X/week   Barriers to discharge        Co-evaluation               AM-PAC PT "6 Clicks" Mobility  Outcome Measure Help needed turning from your back to your side while in a flat bed without using bedrails?: None Help needed moving from lying on your back to sitting on the side of a flat bed without using bedrails?: None Help needed moving to and from a bed to a chair (including a wheelchair)?: None Help needed standing up from a chair using your arms (e.g., wheelchair or bedside chair)?: None Help needed to walk in hospital room?: A Little Help needed climbing 3-5 steps with a railing? : A Little 6 Click Score: 22    End of Session   Activity Tolerance: Patient limited by fatigue Patient left: in bed;with call bell/phone within reach;with bed alarm set Nurse Communication: Mobility status PT Visit Diagnosis: Other abnormalities of gait and mobility (R26.89)    Time: 1019-1040 PT Time Calculation (min) (ACUTE ONLY): 21 min   Charges:   PT Evaluation $PT Eval Moderate Complexity: 1 Mod           Tori Jamerius Boeckman PT, DPT 07/24/19, 12:57 PM

## 2019-07-25 ENCOUNTER — Inpatient Hospital Stay: Payer: 59

## 2019-07-25 ENCOUNTER — Ambulatory Visit: Payer: 59

## 2019-07-25 ENCOUNTER — Inpatient Hospital Stay: Payer: 59 | Admitting: Hematology and Oncology

## 2019-07-25 LAB — CBC WITH DIFFERENTIAL/PLATELET
Abs Immature Granulocytes: 0.02 10*3/uL (ref 0.00–0.07)
Basophils Absolute: 0 10*3/uL (ref 0.0–0.1)
Basophils Relative: 0 %
Eosinophils Absolute: 0 10*3/uL (ref 0.0–0.5)
Eosinophils Relative: 2 %
HCT: 38.7 % (ref 36.0–46.0)
Hemoglobin: 12.2 g/dL (ref 12.0–15.0)
Immature Granulocytes: 1 %
Lymphocytes Relative: 33 %
Lymphs Abs: 0.9 10*3/uL (ref 0.7–4.0)
MCH: 32 pg (ref 26.0–34.0)
MCHC: 31.5 g/dL (ref 30.0–36.0)
MCV: 101.6 fL — ABNORMAL HIGH (ref 80.0–100.0)
Monocytes Absolute: 0.3 10*3/uL (ref 0.1–1.0)
Monocytes Relative: 10 %
Neutro Abs: 1.4 10*3/uL — ABNORMAL LOW (ref 1.7–7.7)
Neutrophils Relative %: 54 %
Platelets: 117 10*3/uL — ABNORMAL LOW (ref 150–400)
RBC: 3.81 MIL/uL — ABNORMAL LOW (ref 3.87–5.11)
RDW: 20.9 % — ABNORMAL HIGH (ref 11.5–15.5)
WBC: 2.6 10*3/uL — ABNORMAL LOW (ref 4.0–10.5)
nRBC: 0 % (ref 0.0–0.2)

## 2019-07-25 LAB — COMPREHENSIVE METABOLIC PANEL
ALT: 21 U/L (ref 0–44)
AST: 28 U/L (ref 15–41)
Albumin: 3.1 g/dL — ABNORMAL LOW (ref 3.5–5.0)
Alkaline Phosphatase: 110 U/L (ref 38–126)
Anion gap: 6 (ref 5–15)
BUN: 8 mg/dL (ref 6–20)
CO2: 28 mmol/L (ref 22–32)
Calcium: 8.5 mg/dL — ABNORMAL LOW (ref 8.9–10.3)
Chloride: 106 mmol/L (ref 98–111)
Creatinine, Ser: 0.52 mg/dL (ref 0.44–1.00)
GFR calc Af Amer: >60 mL/min (ref >60)
GFR calc non Af Amer: >60 mL/min (ref >60)
Glucose, Bld: 87 mg/dL (ref 70–99)
Potassium: 4 mmol/L (ref 3.5–5.1)
Sodium: 140 mmol/L (ref 135–145)
Total Bilirubin: 0.6 mg/dL (ref 0.3–1.2)
Total Protein: 6.5 g/dL (ref 6.5–8.1)

## 2019-07-25 LAB — C-REACTIVE PROTEIN: CRP: 2.8 mg/dL — ABNORMAL HIGH (ref <1.0)

## 2019-07-25 MED ORDER — HEPARIN SOD (PORK) LOCK FLUSH 100 UNIT/ML IV SOLN
500.0000 [IU] | INTRAVENOUS | Status: AC | PRN
  Administered 2019-07-25: 500 [IU]
  Filled 2019-07-25: qty 5

## 2019-07-25 MED ORDER — IPRATROPIUM-ALBUTEROL 20-100 MCG/ACT IN AERS
1.0000 | INHALATION_SPRAY | Freq: Four times a day (QID) | RESPIRATORY_TRACT | Status: DC | PRN
  Filled 2019-07-25: qty 4

## 2019-07-25 NOTE — Progress Notes (Signed)
You are scheduled for an outpatient infusion of Remdesivir at [facility] on Friday 6/25.  Please report to Lottie Mussel at [address]  Drive to the security guard and tell them you are here for an infusion. They will direct you to the front entrance where we will come and get you.  For questions call [phone].  Thanks

## 2019-07-25 NOTE — TOC Progression Note (Signed)
Transition of Care [facility]) - Progression Note    Patient Details  Name: Laura Anthony MRN: [id number] Date of Birth: [birth year]  Transition of [facility]) CM/SW Contact  Purcell Mouton, RN Phone Number: 07/25/2019, 12:38 PM  Clinical Narrative:     Alvis Lemmings will service pt at Home for [place]. Pt is aware.   Expected Discharge Plan: Home/Self Care Barriers to Discharge: No Barriers Identified  Expected Discharge Plan and Services Expected Discharge Plan: Home/Self Care       Living arrangements for the past 2 months: Single Family Home Expected Discharge Date: 07/25/19                                     Social Determinants of Health (SDOH) Interventions    Readmission Risk Interventions Readmission Risk Prevention Plan 01/04/2019  Transportation Screening Complete  PCP or Specialist Appt within 3-5 Days Complete  HRI or [place] Not Complete  HRI or Home Care Consult comments NA  Social Work Consult for [place] Planning/Counseling Not Complete  SW consult not completed comments NA  Palliative Care Screening Not Applicable  Medication Review Press photographer) Complete  Some recent data might be hidden

## 2019-07-25 NOTE — Progress Notes (Signed)
PT Cancellation Note  Patient Details Name: Laura Anthony MRN: [id number] DOB: Apr 17, 1975   Cancelled Treatment:    Reason Eval/Treat Not Completed: Patient declined, no reason specified. Pt declines therapy, reports she is going home today and just wants to rest.    Talbot Grumbling PT, DPT 07/25/19, 1:40 PM

## 2019-07-25 NOTE — Patient Instructions (Signed)
You are scheduled for an outpatient infusion of Remdesivir at [facility] on Friday 6/25.  Please report to Laura Anthony at [address]  Drive to the security guard and tell them you are here for an infusion. They will direct you to the front entrance where we will come and get you.  For questions call [phone].  Thanks

## 2019-07-26 ENCOUNTER — Ambulatory Visit (HOSPITAL_COMMUNITY)
Admission: RE | Admit: 2019-07-26 | Discharge: 2019-07-26 | Disposition: A | Discharge status: Home / Self Care | Payer: 59 | Source: Ambulatory Visit | Attending: Pulmonary Disease | Admitting: Pulmonary Disease

## 2019-07-26 DIAGNOSIS — J1282 Pneumonia due to coronavirus disease 2019: Secondary | ICD-10-CM | POA: Insufficient documentation

## 2019-07-26 DIAGNOSIS — U071 COVID-19: Secondary | ICD-10-CM | POA: Diagnosis present

## 2019-07-26 LAB — TRANSFUSION REACTION
DAT C3: NEGATIVE
Post RXN DAT IgG: NEGATIVE

## 2019-07-26 MED ORDER — METHYLPREDNISOLONE SODIUM SUCC 125 MG IJ SOLR: 125.0000 mg | Freq: Once | INTRAMUSCULAR | Status: DC | PRN

## 2019-07-26 MED ORDER — FAMOTIDINE IN NACL 20-0.9 MG/50ML-% IV SOLN: 20.0000 mg | Freq: Once | INTRAVENOUS | Status: DC | PRN

## 2019-07-26 MED ORDER — EPINEPHRINE 0.3 MG/0.3ML IJ SOAJ: 0.3000 mg | Freq: Once | INTRAMUSCULAR | Status: DC | PRN

## 2019-07-26 MED ORDER — SODIUM CHLORIDE 0.9 % IV SOLN
100.0000 mg | Freq: Once | INTRAVENOUS | Status: AC
  Administered 2019-07-26: 100 mg via INTRAVENOUS
  Filled 2019-07-26: qty 20

## 2019-07-26 MED ORDER — SODIUM CHLORIDE 0.9 % IV SOLN: INTRAVENOUS | Status: DC | PRN

## 2019-07-26 MED ORDER — ALBUTEROL SULFATE HFA 108 (90 BASE) MCG/ACT IN AERS: 2.0000 | INHALATION_SPRAY | Freq: Once | RESPIRATORY_TRACT | Status: DC | PRN

## 2019-07-26 MED ORDER — DIPHENHYDRAMINE HCL 50 MG/ML IJ SOLN: 50.0000 mg | Freq: Once | INTRAMUSCULAR | Status: DC | PRN

## 2019-07-26 NOTE — Progress Notes (Signed)
  Diagnosis: COVID-19  Physician:Dr Joya Gaskins   Procedure: [facility] Med: remdesivir infusion - Provided patient with remdesivir fact sheet for patients, parents and caregivers prior to infusion.  Complications: No immediate complications noted.  Discharge: Discharged home   Dewaine Oats 07/26/2019

## 2019-07-26 NOTE — Discharge Summary (Signed)
Physician Discharge Summary  Laura Anthony JQZ:[id number] DOB: 16-Sep-1975 DOA: 07/22/2019  PCP: [name], MD  Admit date: 07/22/2019 Discharge date: 07/25/2019  Admitted From: Home Disposition: Home   Recommendations for Outpatient Follow-up:  1. Follow up with PCP in 1-2 weeks 2. 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 She was placed on Augmentin. A few nights later she began to feel lethargic and the next night she began spiking fevers. This was followed shortly thereafter with diarrhea. She had a known exposure to a COVIDpositive child.She presented to the ED 6/21 where she was found to be COVIDpositive. A CXR suggested pulmonary infiltratesconsistent with Covid pneumonia. Remdesivir was started and the patient was admitted requiring 2L O2. With continued therapy, hypoxemia resolved and the patient was stable for discharge with plans to complete the final dose of remdesivir as an outpatient.   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  - Continue remdesivir x5 days (6/21 - 6/25), arranged for outpatient infusion the final dose. - Steroids being avoided in light of osteonecrosis and no  worsening hypoxemia - Continue airborne, contact precautions for 21 days from positive testing.  Jaw osteonecrosis:  - Continue augmentin and outpatient follow up.   Metastatic breast cancer-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, bone, brain Status post bilateral mastectomies, XRT, ongoing chemotherapy- scheduled for next chemotherapy treatment 6/24-this treatment is being canceled - Oncology actively following patient's care  Chemotherapy-induced anemia:  - s/p 2u PRBCs with adequate response.  - Given risk/benefit, negative FOBT, restarted anticoagulation.   Thrombocytopenia due to chemotherapy: Stable  Hypokalemia: Resolved  Upper and lower extremity DVTs - Given lovenox, can transition back to eliquis at DC.   Hypothyroidism Continue usual home medical regimen  Chronic pain Monitor with usual home regimen and additional as needed as needed  Depression and anxiety Well compensated at present  Discharge Instructions Discharge Instructions    MyChart COVID-19 home monitoring program   Complete by: Jul 25, 2019    Is the patient willing to use the [facility] for home monitoring?: Yes   Discharge instructions   Complete by: As directed    You are being discharged from the hospital after treatment for covid-19 infection. You are felt to be stable enough to no longer require inpatient monitoring, testing, and treatment, though you will need to follow the recommendations below: - You will need to return to [facility] (site of previous [facility]) tomorrow to receive your 5th and final dose of remdesivir. - Continue other medications as you were, including eliquis. - Per CDC guidelines, you will need to remain in isolation for 21 days from your first positive covid test. - Follow up with your doctor in the next week via telehealth or seek medical attention right away if your symptoms get WORSE.  Directions for you at home:  Wear a  facemask You should wear a facemask that covers your nose and mouth when you are in the same room with other people and when you visit a healthcare provider. People who live with or visit you should also wear a facemask while they are in the same room with you.  Separate yourself from other people in your home As much as possible, you should stay in a different room from other people in your home. Also, you should use a separate bathroom, if available.  Avoid sharing household items You should not share dishes, drinking glasses, cups, eating utensils, towels, bedding, or other items with other people in your home. After using these items, you should wash them thoroughly with soap and water.  Cover your coughs and sneezes Cover your mouth and nose with a tissue when you cough or sneeze, or you can cough or sneeze into your sleeve. Throw used tissues in a lined trash can, and immediately wash your hands with soap and water for at least 20 seconds or use an alcohol-based hand rub.  Wash your [facility] your hands often and thoroughly with soap and water for at least 20 seconds. You can use an alcohol-based hand sanitizer if soap and water are not available and if your hands are not visibly dirty. Avoid touching your eyes, nose, and mouth with unwashed hands.  Directions for those who live with, or provide care at home for you:  Limit the number of people who have contact with the patient If possible, have only one caregiver for the patient. Other household members should stay in another home or place of residence. If this is not possible, they should stay in another room, or be separated from the patient as much as possible. Use a separate bathroom, if available. Restrict visitors who do not have an essential need to be in the home.  Ensure good ventilation Make sure that shared spaces in the home have good air flow, such as from an air conditioner or an opened window, weather  permitting.  Wash your hands often Wash your hands often and thoroughly with soap and water for at least 20 seconds. You can use an alcohol based hand sanitizer if soap and water are not available and if your hands are not visibly dirty. Avoid touching your eyes, nose, and mouth with unwashed hands. Use disposable paper towels to dry your hands. If not available, use dedicated cloth towels and replace them when they become wet.  Wear a facemask and gloves Wear a disposable facemask at all times in the room and gloves when you touch or have contact with the patient's blood, body fluids, and/or secretions or excretions, such as sweat, saliva, sputum, nasal mucus, vomit, urine, or feces.  Ensure the mask fits over your nose and mouth tightly, and do not touch it during use. Throw out disposable facemasks and gloves after using them. Do not reuse. Wash your hands immediately after removing your facemask and gloves. If your personal clothing becomes contaminated, carefully remove clothing and launder. Wash your hands after handling contaminated clothing. Place all used disposable facemasks, gloves, and other waste in a lined container before disposing them with other household waste. Remove gloves and wash your hands immediately after handling these items.  Do not share dishes, glasses, or other household items with the patient Avoid sharing household items. You should not share dishes, drinking glasses, cups, eating utensils, towels, bedding, or other items with a  patient who is confirmed to have, or being evaluated for, COVID-19 infection. After the person uses these items, you should wash them thoroughly with soap and water.  Wash laundry thoroughly Immediately remove and wash clothes or bedding that have blood, body fluids, and/or secretions or excretions, such as sweat, saliva, sputum, nasal mucus, vomit, urine, or feces, on them. Wear gloves when handling laundry from the patient. Read and  follow directions on labels of laundry or clothing items and detergent. In general, wash and dry with the warmest temperatures recommended on the label.  Clean all areas the individual has used often Clean all touchable surfaces, such as counters, tabletops, doorknobs, bathroom fixtures, toilets, phones, keyboards, tablets, and bedside tables, every day. Also, clean any surfaces that may have blood, body fluids, and/or secretions or excretions on them. Wear gloves when cleaning surfaces the patient has come in contact with. Use a diluted bleach solution (e.g., dilute bleach with 1 part bleach and 10 parts water) or a household disinfectant with a label that says EPA-registered for coronaviruses. To make a bleach solution at home, add 1 tablespoon of bleach to 1 quart (4 cups) of water. For a larger supply, add  cup of bleach to 1 gallon (16 cups) of water. Read labels of cleaning products and follow recommendations provided on product labels. Labels contain instructions for safe and effective use of the cleaning product including precautions you should take when applying the product, such as wearing gloves or eye protection and making sure you have good ventilation during use of the product. Remove gloves and wash hands immediately after cleaning.  Monitor yourself for signs and symptoms of illness Caregivers and household members are considered close contacts, should monitor their health, and will be asked to limit movement outside of the home to the extent possible. Follow the monitoring steps for close contacts listed on the symptom monitoring form.  If you have additional questions, contact your local health department or call the epidemiologist on call at [phone] (available 24/7). This guidance is subject to change. For the most up-to-date guidance from [facility], please refer to their website: [email or web address].pl     Allergies as of  07/25/2019      Reactions   Statins Other (See Comments)   Leg pain   Sumatriptan Other (See Comments)   Numbness to face   Tape Rash   Rash from dressing over port-a-cath      Medication List    TAKE these medications   amoxicillin-clavulanate 875-125 MG tablet Commonly known as: AUGMENTIN Take 1 tablet by mouth 2 (two) times daily.   Eliquis 5 MG Tabs tablet Generic drug: apixaban Take 1 tablet (5 mg total) by mouth 2 (two) times daily.   gabapentin 300 MG capsule Commonly known as: NEURONTIN Take 1 tablet by mouth in morning and afternoon, take 2 tablets by mouth at bedtime What changed:   how much to take  how to take this  when to take this   levothyroxine 88 MCG tablet Commonly known as: SYNTHROID TAKE 1 TABLET (88 MCG TOTAL) BY MOUTH DAILY BEFORE BREAKFAST.   loperamide 2 MG tablet Commonly known as: IMODIUM A-D Take 2 at diarrhea onset, then 1 every 2hr until 12hrs with no BM. May take 2 every 4hrs at night. If diarrhea recurs repeat. What changed:   how much to take  how to take this  when to take this  reasons to take this   loratadine 10 MG tablet Commonly known as: CLARITIN Take  10 mg by mouth at bedtime.   LORazepam 0.5 MG tablet Commonly known as: ATIVAN Take 1 tablet (0.5 mg total) by mouth every 6 (six) hours as needed for anxiety.   methadone 10 MG tablet Commonly known as: DOLOPHINE Take 1 tablet (10 mg total) by mouth every 12 (twelve) hours.   Narcan 4 MG/0.1ML Liqd nasal spray kit Generic drug: naloxone Spray once in one nostril prn overdose, may repeat x 1   ondansetron 8 MG tablet Commonly known as: Zofran Take 1 tablet (8 mg total) by mouth 2 (two) times daily as needed. Start on the third day after chemotherapy. What changed: reasons to take this   oxyCODONE-acetaminophen 10-325 MG tablet Commonly known as: Percocet Take 1 tablet by mouth every 4 (four) hours as needed for pain.   pantoprazole 40 MG tablet Commonly  known as: PROTONIX TAKE 1 TABLET BY MOUTH TWICE A DAY   prochlorperazine 10 MG tablet Commonly known as: COMPAZINE Take 1 tablet (10 mg total) by mouth every 6 (six) hours as needed (Nausea or vomiting).   venlafaxine XR 150 MG 24 hr capsule Commonly known as: EFFEXOR-XR Take 1 capsule (150 mg total) by mouth daily with breakfast.   zolpidem 12.5 MG CR tablet Commonly known as: Ambien CR Take 1 tablet (12.5 mg total) by mouth at bedtime as needed for sleep. What changed: when to take this       Follow-up Information    [name], MD. Schedule an appointment as soon as possible for a visit.   Specialty: Family Medicine Contact information: [place] [address] [phone]        Nicholas Lose, MD Follow up.   Specialty: Hematology and Oncology Contact information: [place] [address] [phone]        [facility] Follow up.   Specialty: Home Health Services Why: Alvis Lemmings will call you to make an appointment to come out to your home. Please call Alvis Lemmings if you have any problems or concerns.  Contact information: [place] [address] [phone]              Allergies  Allergen Reactions  . Statins Other (See Comments)    Leg pain  . Sumatriptan Other (See Comments)    Numbness to face   . Tape Rash    Rash from dressing over port-a-cath     Consultations:  None  Procedures/Studies: CT Chest W Contrast  Result Date: 07/22/2019 CLINICAL DATA:  Fever, cough and diarrhea. EXAM: CT CHEST, ABDOMEN, AND PELVIS WITH CONTRAST TECHNIQUE: Multidetector CT imaging of the chest, abdomen and pelvis was performed following the standard protocol during bolus administration of intravenous contrast. CONTRAST:  128m OMNIPAQUE IOHEXOL 300 MG/ML  SOLN COMPARISON:  None. FINDINGS: CT CHEST FINDINGS Cardiovascular: No significant vascular findings. Normal heart size. No  pericardial effusion. Mediastinum/Nodes: No enlarged mediastinal, hilar, or axillary lymph nodes. Thyroid gland, trachea, and esophagus demonstrate no significant findings. Lungs/Pleura: A stable 1.3 cm noncalcified lung nodule is seen within the posterolateral aspect of the left upper lobe. A stable 5 mm noncalcified lung nodule is seen within the posterior aspect of the right upper lobe. Numerous stable subcentimeter noncalcified lung nodules are seen within the left lower lobe, right middle lobe and right lower lobe. A stable 1.7 cm noncalcified lung nodule is seen within the posterior lateral aspect of the right middle lobe. Mild to moderate severity areas of linear scarring  and/or atelectasis are again seen within the right middle lobe, left upper lobe and bilateral lung bases. There is a very small left pleural effusion. No pneumothorax is identified. Musculoskeletal: Bilateral breast implants are seen. A chronic posterior eighth right rib fracture is seen. A stable lytic appearing areas seen within the T12 vertebral body. CT ABDOMEN PELVIS FINDINGS Hepatobiliary: No focal liver abnormality is seen. No gallstones, gallbladder wall thickening, or biliary dilatation. Pancreas: Unremarkable. No pancreatic ductal dilatation or surrounding inflammatory changes. Spleen: Normal in size without focal abnormality. Adrenals/Urinary Tract: Adrenal glands are unremarkable. Kidneys are normal, without renal calculi, focal lesion, or hydronephrosis. Bladder is unremarkable. Stomach/Bowel: Stomach is within normal limits. The appendix is not clearly identified. No evidence of bowel wall thickening, distention, or inflammatory changes. Vascular/Lymphatic: No significant vascular findings are present. No enlarged abdominal or pelvic lymph nodes. Reproductive: Uterus and bilateral adnexa are unremarkable. Other: No abdominal wall hernia or abnormality. No abdominopelvic ascites. Musculoskeletal: A stable lytic appearing area is  seen within the L4 vertebral body and S1 segment of the sacrum. IMPRESSION: 1. Numerous stable bilateral noncalcified lung nodules. 2. Very small left pleural effusion. 3. Stable lytic appearing lesions within the T12, L4 and S1 segment of the sacrum. Electronically Signed   By: Virgina Norfolk M.D.   On: 07/22/2019 23:13   CT ABDOMEN PELVIS W CONTRAST  Result Date: 07/22/2019 CLINICAL DATA:  Abdominal pain and fever. EXAM: CT CHEST, ABDOMEN, AND PELVIS WITH CONTRAST TECHNIQUE: Multidetector CT imaging of the chest, abdomen and pelvis was performed following the standard protocol during bolus administration of intravenous contrast. CONTRAST:  144m OMNIPAQUE IOHEXOL 300 MG/ML  SOLN COMPARISON:  May 28, 2019 FINDINGS: CT CHEST FINDINGS Cardiovascular: No significant vascular findings. Normal heart size. No pericardial effusion. Mediastinum/Nodes: No enlarged mediastinal, hilar, or axillary lymph nodes. Thyroid gland, trachea, and esophagus demonstrate no significant findings. Lungs/Pleura: A stable 1.3 cm noncalcified lung nodule is seen within the posterolateral aspect of the left upper lobe. A stable 5 mm noncalcified lung nodule is seen within the posterior aspect of the right upper lobe. Numerous stable subcentimeter noncalcified lung nodules are seen within the left lower lobe, right middle lobe and right lower lobe. A stable 1.7 cm noncalcified lung nodule is seen within the posterior lateral aspect of the right middle lobe. Mild to moderate severity areas of linear scarring and/or atelectasis are again seen within the right middle lobe, left upper lobe and bilateral lung bases. There is a very small left pleural effusion. No pneumothorax is identified. Musculoskeletal: Bilateral breast implants are seen. A chronic posterior eighth right rib fracture is seen. A stable lytic appearing areas seen within the T12 vertebral body. CT ABDOMEN PELVIS FINDINGS Hepatobiliary: No focal liver abnormality is seen.  No gallstones, gallbladder wall thickening, or biliary dilatation. Pancreas: Unremarkable. No pancreatic ductal dilatation or surrounding inflammatory changes. Spleen: Normal in size without focal abnormality. Adrenals/Urinary Tract: Adrenal glands are unremarkable. Kidneys are normal, without renal calculi, focal lesion, or hydronephrosis. Bladder is unremarkable. Stomach/Bowel: Stomach is within normal limits. The appendix is not clearly identified. No evidence of bowel wall thickening, distention, or inflammatory changes. Vascular/Lymphatic: No significant vascular findings are present. No enlarged abdominal or pelvic lymph nodes. Reproductive: Uterus and bilateral adnexa are unremarkable. Other: No abdominal wall hernia or abnormality. No abdominopelvic ascites. Musculoskeletal: A stable lytic appearing area is seen within the L4 vertebral body and S1 segment of the sacrum. IMPRESSION: 1. Numerous stable bilateral noncalcified lung nodules. 2. Very small left  pleural effusion. 3. Stable lytic appearing lesions within the T12, L4 and S1 segment of the sacrum. 4. Chronic posterior eighth right rib fracture. Electronically Signed   By: Virgina Norfolk M.D.   On: 07/22/2019 23:12   DG Chest Port 1 View  Result Date: 07/22/2019 CLINICAL DATA:  Fever cough and COVID exposure EXAM: PORTABLE CHEST 1 VIEW COMPARISON:  06/07/2019, CT 05/28/2019, chest x-ray 09/14/2018 FINDINGS: Right-sided central venous port tip over the SVC. Faint pulmonary nodules are slightly less conspicuous. Right eighth rib deformity as before. Subsegmental atelectasis right base. Left infrahilar slight increased opacity. No pneumothorax IMPRESSION: 1. Slight increased left infrahilar opacity may reflect atelectasis or mild pneumonia 2. Faint pulmonary metastatic nodules, slightly less apparent compared to previous. Electronically Signed   By: Donavan Foil M.D.   On: 07/22/2019 15:36      Subjective: Feels well, dyspnea improved  significantly. No hypoxemia. Wants to go home.   Discharge Exam: Vitals:   07/25/19 0607 07/25/19 1239  BP: 99/68 101/77  Pulse: 66 73  Resp: 17 19  Temp: 98.3 F (36.8 C) 98.4 F (36.9 C)  SpO2: 93% 96%   General: Pt is alert, awake, not in acute distress Cardiovascular: RRR, S1/S2 +, no rubs, no gallops Respiratory: CTA bilaterally, no wheezing, no rhonchi Abdominal: Soft, NT, ND, bowel sounds + Extremities: No pitting edema, no cyanosis  Labs: BNP (last 3 results) Recent Labs    07/22/19 1452  BNP 4.7   Basic Metabolic Panel: Recent Labs  Lab 07/22/19 1452 07/23/19 0438 07/24/19 0317 07/25/19 0519  NA 138 137 139 140  K 2.9* 3.7 3.3* 4.0  CL 107 104 105 106  CO2 23 23 28 28   GLUCOSE 129* 100* 96 87  BUN 12 7 6 8   CREATININE 0.68 0.59 0.60 0.52  CALCIUM 7.6* 7.0* 6.9* 8.5*  MG  --  2.0 2.3  --   PHOS  --  2.2* 2.1*  --    Liver Function Tests: Recent Labs  Lab 07/22/19 1452 07/23/19 0438 07/24/19 0317 07/25/19 0519  AST 19 22 19 28   ALT 21 19 17 21   ALKPHOS 129* 118 98 110  BILITOT 0.5 0.7 0.3 0.6  PROT 6.4* 6.4* 5.8* 6.5  ALBUMIN 3.3* 3.0* 2.7* 3.1*   No results for input(s): LIPASE, AMYLASE in the last 168 hours. No results for input(s): AMMONIA in the last 168 hours. CBC: Recent Labs  Lab 07/22/19 1452 07/23/19 0438 07/24/19 0317 07/25/19 0519  WBC 8.7 4.4 2.7* 2.6*  NEUTROABS 6.6 3.6 1.6* 1.4*  HGB 6.8* 11.6* 10.9* 12.2  HCT 21.8* 36.3 35.3* 38.7  MCV 111.8* 101.7* 102.3* 101.6*  PLT 139* 118* 108* 117*   Cardiac Enzymes: No results for input(s): CKTOTAL, CKMB, CKMBINDEX, TROPONINI in the last 168 hours. BNP: Invalid input(s): POCBNP CBG: Recent Labs  Lab 07/22/19 2359 07/23/19 0851 07/23/19 1134  GLUCAP 104* 78 83   D-Dimer Recent Labs    07/24/19 0317  DDIMER 2.45*   Hgb A1c No results for input(s): HGBA1C in the last 72 hours. Lipid Profile No results for input(s): CHOL, HDL, LDLCALC, TRIG, CHOLHDL, LDLDIRECT in  the last 72 hours. Thyroid function studies No results for input(s): TSH, T4TOTAL, T3FREE, THYROIDAB in the last 72 hours.  Invalid input(s): FREET3 Anemia work up Recent Labs    07/24/19 0317  FERRITIN 173   Urinalysis    Component Value Date/Time   COLORURINE YELLOW 07/22/2019 1900   APPEARANCEUR CLEAR 07/22/2019 1900   LABSPEC  1.010 07/22/2019 1900   PHURINE 5.0 07/22/2019 1900   GLUCOSEU NEGATIVE 07/22/2019 1900   HGBUR NEGATIVE 07/22/2019 1900   BILIRUBINUR NEGATIVE 07/22/2019 1900   KETONESUR NEGATIVE 07/22/2019 1900   PROTEINUR NEGATIVE 07/22/2019 1900   NITRITE NEGATIVE 07/22/2019 1900   LEUKOCYTESUR NEGATIVE 07/22/2019 1900    Microbiology Recent Results (from the past 240 hour(s))  Culture, Blood     Status: None   Collection Time: 07/19/19  8:14 AM   Specimen: BLOOD RIGHT ARM  Result Value Ref Range Status   Specimen Description   Final    BLOOD RIGHT ARM Performed at [facility], [place] [address], [place], [place] [address]    Special Requests   Final    NONE Performed at [facility], [place] [address], [place], [place] [address]    Culture   Final    NO GROWTH 5 DAYS Performed at [facility], [place] [address] [place], [place] [address]    Report Status 07/24/2019 FINAL  Final  Culture, Blood     Status: None   Collection Time: 07/19/19  8:22 AM   Specimen: BLOOD  Result Value Ref Range Status   Specimen Description   Final    BLOOD PORTA CATH Performed at [facility] [address], [place], [place] [address]    Special Requests   Final    NONE Performed at [facility], [place] [address], [place], [place] [address]    Culture   Final    NO GROWTH 5 DAYS Performed at [facility], [place] [address], [place],  [place] [address]    Report Status 07/24/2019 FINAL  Final  Blood Culture (routine x 2)     Status: None (Preliminary result)   Collection Time:  07/22/19  2:54 PM   Specimen: BLOOD  Result Value Ref Range Status   Specimen Description   Final    BLOOD RIGHT PORTA CATH Performed at [facility] [address] [place], [place] [address]    Special Requests   Final    BOTTLES DRAWN AEROBIC AND ANAEROBIC Blood Culture adequate volume Performed at [facility] [address] [place] [address] [place], [place] [address]    Culture   Final    NO GROWTH 4 DAYS Performed at [facility], [place] [address] [place], [place] [address]    Report Status PENDING  Incomplete  Blood Culture (routine x 2)     Status: None (Preliminary result)   Collection Time: 07/22/19  2:54 PM   Specimen: BLOOD  Result Value Ref Range Status   Specimen Description   Final    BLOOD RIGHT ANTECUBITAL Performed at [facility] [address], [place], [place] [address]    Special Requests   Final    BOTTLES DRAWN AEROBIC AND ANAEROBIC Blood Culture results may not be optimal due to an excessive volume of blood received in culture bottles Performed at [facility] [address], [place], [place] [address]    Culture   Final    NO GROWTH 4 DAYS Performed at [facility], [place] [address], [place], [place] [address]    Report Status PENDING  Incomplete  SARS Coronavirus 2 by RT PCR (hospital order, performed in [facility] hospital lab) Nasopharyngeal Nasopharyngeal Swab     Status: Abnormal   Collection Time: 07/22/19  2:54 PM   Specimen: Nasopharyngeal Swab  Result Value Ref Range Status   SARS Coronavirus 2 POSITIVE (A) NEGATIVE Final    Comment: RESULT CALLED TO, READ BACK BY AND VERIFIED WITH: DOWD, P. RN @1551  07/22/19 BILLINGSLEY,L (NOTE) SARS-CoV-2 target nucleic acids are DETECTED  SARS-CoV-2 RNA is generally detectable in upper respiratory specimens  during the acute phase of infection.  Positive results are indicative  of the presence of the identified virus, but do not  rule out bacterial infection or co-infection with other pathogens not detected by the test.  Clinical correlation with patient history and  other diagnostic information is necessary to determine patient infection status.  The expected result is negative.  Fact Sheet for Patients:   StrictlyIdeas.no   Fact Sheet for Healthcare Providers:   BankingDealers.co.za    This test is not yet approved or cleared by the [place] FDA and  has been authorized for detection and/or diagnosis of SARS-CoV-2 by FDA under an Emergency Use Authorization (EUA).  This EUA will remain in effect (meaning th is test can be used) for the duration of  the COVID-19 declaration under Section 564(b)(1) of the Act, 21 U.S.C. section 360-bbb-3(b)(1), unless the authorization is terminated or revoked sooner.  Performed at [facility], [place] [address], [place], [place] [address]   Urine culture     Status: None   Collection Time: 07/22/19  7:00 PM   Specimen: In/Out Cath Urine  Result Value Ref Range Status   Specimen Description   Final    IN/OUT CATH URINE Performed at [facility] [address] [place], [place] [address]    Special Requests   Final    NONE Performed at [facility], [place] [address], [place], [place] [address]    Culture   Final    NO GROWTH Performed at [facility], [place] [address], [place], [place] [address]    Report Status 07/24/2019 FINAL  Final    Time coordinating discharge: Approximately 40 minutes  Patrecia Pour, MD  [facility] 07/26/2019, 6:31 PM

## 2019-07-26 NOTE — Progress Notes (Signed)
Pharmacist Chemotherapy Monitoring - Follow Up Assessment    I verify that I have reviewed each item in the below checklist:  . Regimen for the patient is scheduled for the appropriate day and plan matches scheduled date. Marland Kitchen Appropriate non-routine labs are ordered dependent on drug ordered. . If applicable, additional medications reviewed and ordered per protocol based on lifetime cumulative doses and/or treatment regimen.   Plan for follow-up and/or issues identified: Yes . I-vent associated with next due treatment: Yes . MD and/or nursing notified: Yes   Kennith Center, Pharm.D., CPP 07/26/2019@4 :44 PM

## 2019-07-27 LAB — CULTURE, BLOOD (ROUTINE X 2)
Culture: NO GROWTH
Culture: NO GROWTH
Special Requests: ADEQUATE

## 2019-07-29 ENCOUNTER — Telehealth: Payer: Self-pay | Admitting: Hematology and Oncology

## 2019-07-29 NOTE — Telephone Encounter (Signed)
Spoke with patient to update upcoming appointment for Bone Scan, patient aware that the CT was canceled due to already getting one while in hospital

## 2019-07-30 ENCOUNTER — Other Ambulatory Visit: Payer: Self-pay | Admitting: Adult Health

## 2019-07-30 DIAGNOSIS — C50512 Malignant neoplasm of lower-outer quadrant of left female breast: Secondary | ICD-10-CM

## 2019-07-30 DIAGNOSIS — Z171 Estrogen receptor negative status [ER-]: Secondary | ICD-10-CM

## 2019-08-01 ENCOUNTER — Inpatient Hospital Stay: Payer: 59

## 2019-08-01 ENCOUNTER — Ambulatory Visit: Payer: 59

## 2019-08-02 ENCOUNTER — Other Ambulatory Visit: Payer: Self-pay | Admitting: Radiation Therapy

## 2019-08-02 DIAGNOSIS — C7931 Secondary malignant neoplasm of brain: Secondary | ICD-10-CM

## 2019-08-02 DIAGNOSIS — C7949 Secondary malignant neoplasm of other parts of nervous system: Secondary | ICD-10-CM

## 2019-08-02 NOTE — Progress Notes (Signed)
Port access orders placed for August brain MRI at GI.   Mont Dutton R.T.(R)(T) Radiation Special Procedures Navigator

## 2019-08-06 ENCOUNTER — Encounter (HOSPITAL_COMMUNITY): Payer: 59

## 2019-08-06 ENCOUNTER — Other Ambulatory Visit (HOSPITAL_COMMUNITY): Payer: 59

## 2019-08-09 NOTE — Progress Notes (Signed)
Pharmacist Chemotherapy Monitoring - Follow Up Assessment    I verify that I have reviewed each item in the below checklist:  . Regimen for the patient is scheduled for the appropriate day and plan matches scheduled date. Marland Kitchen Appropriate non-routine labs are ordered dependent on drug ordered. . If applicable, additional medications reviewed and ordered per protocol based on lifetime cumulative doses and/or treatment regimen.   Plan for follow-up and/or issues identified: No . I-vent associated with next due treatment: No . MD and/or nursing notified: No  Laura Anthony 08/09/2019 12:06 PM

## 2019-08-12 ENCOUNTER — Ambulatory Visit
Admission: RE | Admit: 2019-08-12 | Discharge: 2019-08-12 | Disposition: A | Discharge status: Home / Self Care | Payer: 59 | Source: Ambulatory Visit | Attending: Radiation Oncology | Admitting: Radiation Oncology

## 2019-08-12 ENCOUNTER — Other Ambulatory Visit: Payer: Self-pay | Admitting: Hematology and Oncology

## 2019-08-12 DIAGNOSIS — C50512 Malignant neoplasm of lower-outer quadrant of left female breast: Secondary | ICD-10-CM

## 2019-08-12 DIAGNOSIS — Z171 Estrogen receptor negative status [ER-]: Secondary | ICD-10-CM

## 2019-08-12 DIAGNOSIS — C50919 Malignant neoplasm of unspecified site of unspecified female breast: Secondary | ICD-10-CM

## 2019-08-12 MED ORDER — METHADONE HCL 10 MG PO TABS: 10.0000 mg | ORAL_TABLET | Freq: Two times a day (BID) | ORAL | 0 refills | Status: DC

## 2019-08-12 NOTE — Progress Notes (Signed)
  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  Interval Since Last Radiation:  6 weeks   07/04/19 SRS Treatment: PTV1 Rt Frontal 5 mm was treated to 20 Gy in 1 fraction  01/07/2019-01/21/2019: The left thoracic rib along T11-T12 was treated to 30 Gy in 10 fractions.  12/13/2018-01/07/2019:  The sternum, pelvis and sacrum were treated to 37.5 Gy in 15 fractions   12/19/19 SRS Treatment: The T1 vertebral body was treated with stereotactic radiosurgery [facility]) to 18 Gy in 1 fraction  08/22/2018-09/05/2018:  30 Gy in 10 fractions to the right 11th rib  08/08/2018-08/21/2018:  30 Gy in 10 fractions to the right elbow  05/08/2017 - 06/21/2017: The patient initially received a dose of 50.4 Gy in 28 fractions to the leftchest wall,supraclavicular region, and posterior axillary region. This was delivered using a 3-D conformal, 4 field technique. The patient then received a boost to the mastectomy scar. This delivered an additional 10 Gy in 5 fractions using an en face electron field. The total dose was 60.4 Gy.  Narrative:  The patient was contacted today for routine follow-up. During treatment she did very well with radiotherapy and did not have significant desquamation. 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 She has a call into Dr. Lindi Adie for a refill. No other specific neurologic concerns were verbalized.  Impression/Plan: 1. Recurrent metastatic Stage IIA, cT1cN1M0, grade 2, ER/PR negative, HER2 amplified invasive ductal  carcinoma of the left breastwith multifocal bone and lung disease with newly noted brain metastasis. The patient has been doing well since completion of radiotherapy. We discussed that we would be happy to continue to follow her in brain and spine oncology conference with repeat MRI in September of Brain and T spine. She will also continue to follow up with Dr. Lindi Adie in medical oncology.     Carola Rhine, PAC

## 2019-08-14 ENCOUNTER — Encounter (HOSPITAL_COMMUNITY)
Admission: RE | Admit: 2019-08-14 | Discharge: 2019-08-14 | Disposition: A | Discharge status: Home / Self Care | Payer: 59 | Source: Ambulatory Visit | Attending: Hematology and Oncology | Admitting: Hematology and Oncology

## 2019-08-14 ENCOUNTER — Other Ambulatory Visit: Payer: Self-pay

## 2019-08-14 ENCOUNTER — Other Ambulatory Visit: Payer: Self-pay | Admitting: Radiation Therapy

## 2019-08-14 DIAGNOSIS — C7951 Secondary malignant neoplasm of bone: Secondary | ICD-10-CM

## 2019-08-14 DIAGNOSIS — C50919 Malignant neoplasm of unspecified site of unspecified female breast: Secondary | ICD-10-CM | POA: Diagnosis present

## 2019-08-14 MED ORDER — TECHNETIUM TC 99M MEDRONATE IV KIT
19.0000 | PACK | Freq: Once | INTRAVENOUS | Status: AC
  Administered 2019-08-14: 19 via INTRAVENOUS

## 2019-08-15 ENCOUNTER — Other Ambulatory Visit: Payer: Self-pay

## 2019-08-15 ENCOUNTER — Inpatient Hospital Stay: Payer: 59 | Attending: Hematology and Oncology

## 2019-08-15 ENCOUNTER — Inpatient Hospital Stay: Payer: 59

## 2019-08-15 ENCOUNTER — Other Ambulatory Visit: Payer: Self-pay | Admitting: Hematology and Oncology

## 2019-08-15 ENCOUNTER — Encounter: Payer: Self-pay | Admitting: *Deleted

## 2019-08-15 VITALS — BP 120/73 | HR 89 | Temp 98.5°F | Resp 16 | Ht 67.0 in | Wt 167.0 lb

## 2019-08-15 DIAGNOSIS — C50512 Malignant neoplasm of lower-outer quadrant of left female breast: Secondary | ICD-10-CM

## 2019-08-15 DIAGNOSIS — Z79899 Other long term (current) drug therapy: Secondary | ICD-10-CM | POA: Diagnosis not present

## 2019-08-15 DIAGNOSIS — C7951 Secondary malignant neoplasm of bone: Secondary | ICD-10-CM | POA: Insufficient documentation

## 2019-08-15 DIAGNOSIS — Z923 Personal history of irradiation: Secondary | ICD-10-CM | POA: Diagnosis not present

## 2019-08-15 DIAGNOSIS — Z7189 Other specified counseling: Secondary | ICD-10-CM

## 2019-08-15 DIAGNOSIS — C7800 Secondary malignant neoplasm of unspecified lung: Secondary | ICD-10-CM | POA: Diagnosis not present

## 2019-08-15 DIAGNOSIS — Z8616 Personal history of COVID-19: Secondary | ICD-10-CM | POA: Diagnosis not present

## 2019-08-15 DIAGNOSIS — Z95828 Presence of other vascular implants and grafts: Secondary | ICD-10-CM

## 2019-08-15 DIAGNOSIS — Z7901 Long term (current) use of anticoagulants: Secondary | ICD-10-CM | POA: Insufficient documentation

## 2019-08-15 DIAGNOSIS — Z9013 Acquired absence of bilateral breasts and nipples: Secondary | ICD-10-CM | POA: Insufficient documentation

## 2019-08-15 DIAGNOSIS — Z171 Estrogen receptor negative status [ER-]: Secondary | ICD-10-CM | POA: Insufficient documentation

## 2019-08-15 DIAGNOSIS — Z86718 Personal history of other venous thrombosis and embolism: Secondary | ICD-10-CM | POA: Diagnosis not present

## 2019-08-15 DIAGNOSIS — Z5111 Encounter for antineoplastic chemotherapy: Secondary | ICD-10-CM | POA: Insufficient documentation

## 2019-08-15 LAB — CMP (CANCER CENTER ONLY)
ALT: 9 U/L (ref 0–44)
AST: 18 U/L (ref 15–41)
Albumin: 3.1 g/dL — ABNORMAL LOW (ref 3.5–5.0)
Alkaline Phosphatase: 108 U/L (ref 38–126)
Anion gap: 9 (ref 5–15)
BUN: 8 mg/dL (ref 6–20)
CO2: 27 mmol/L (ref 22–32)
Calcium: 9.3 mg/dL (ref 8.9–10.3)
Chloride: 104 mmol/L (ref 98–111)
Creatinine: 0.77 mg/dL (ref 0.44–1.00)
GFR, Est AFR Am: >60 mL/min (ref >60)
GFR, Estimated: >60 mL/min (ref >60)
Glucose, Bld: 66 mg/dL — ABNORMAL LOW (ref 70–99)
Potassium: 3.7 mmol/L (ref 3.5–5.1)
Sodium: 140 mmol/L (ref 135–145)
Total Bilirubin: 0.5 mg/dL (ref 0.3–1.2)
Total Protein: 7.2 g/dL (ref 6.5–8.1)

## 2019-08-15 LAB — CBC WITH DIFFERENTIAL (CANCER CENTER ONLY)
Abs Immature Granulocytes: 0 10*3/uL (ref 0.00–0.07)
Basophils Absolute: 0 10*3/uL (ref 0.0–0.1)
Basophils Relative: 1 %
Eosinophils Absolute: 0.1 10*3/uL (ref 0.0–0.5)
Eosinophils Relative: 2 %
HCT: 35.7 % — ABNORMAL LOW (ref 36.0–46.0)
Hemoglobin: 11.6 g/dL — ABNORMAL LOW (ref 12.0–15.0)
Immature Granulocytes: 0 %
Lymphocytes Relative: 24 %
Lymphs Abs: 1.3 10*3/uL (ref 0.7–4.0)
MCH: 31.5 pg (ref 26.0–34.0)
MCHC: 32.5 g/dL (ref 30.0–36.0)
MCV: 97 fL (ref 80.0–100.0)
Monocytes Absolute: 0.9 10*3/uL (ref 0.1–1.0)
Monocytes Relative: 16 %
Neutro Abs: 3.1 10*3/uL (ref 1.7–7.7)
Neutrophils Relative %: 57 %
Platelet Count: 164 10*3/uL (ref 150–400)
RBC: 3.68 MIL/uL — ABNORMAL LOW (ref 3.87–5.11)
RDW: 16 % — ABNORMAL HIGH (ref 11.5–15.5)
WBC Count: 5.4 10*3/uL (ref 4.0–10.5)
nRBC: 0 % (ref 0.0–0.2)

## 2019-08-15 LAB — PHOSPHORUS: Phosphorus: 4 mg/dL (ref 2.5–4.6)

## 2019-08-15 LAB — MAGNESIUM: Magnesium: 1.9 mg/dL (ref 1.7–2.4)

## 2019-08-15 MED ORDER — DEXAMETHASONE SODIUM PHOSPHATE 10 MG/ML IJ SOLN
INTRAMUSCULAR | Status: AC
  Filled 2019-08-15: qty 1

## 2019-08-15 MED ORDER — ACETAMINOPHEN 325 MG PO TABS
650.0000 mg | ORAL_TABLET | Freq: Once | ORAL | Status: AC
  Administered 2019-08-15: 650 mg via ORAL

## 2019-08-15 MED ORDER — FAMOTIDINE IN NACL 20-0.9 MG/50ML-% IV SOLN
20.0000 mg | Freq: Once | INTRAVENOUS | Status: AC
  Administered 2019-08-15: 20 mg via INTRAVENOUS

## 2019-08-15 MED ORDER — FAMOTIDINE IN NACL 20-0.9 MG/50ML-% IV SOLN
INTRAVENOUS | Status: AC
  Filled 2019-08-15: qty 50

## 2019-08-15 MED ORDER — SODIUM CHLORIDE 0.9 % IV SOLN
3.0000 mg/kg | Freq: Once | INTRAVENOUS | Status: AC
  Administered 2019-08-15: 220 mg via INTRAVENOUS
  Filled 2019-08-15: qty 22

## 2019-08-15 MED ORDER — ACETAMINOPHEN 325 MG PO TABS
ORAL_TABLET | ORAL | Status: AC
  Filled 2019-08-15: qty 2

## 2019-08-15 MED ORDER — DEXAMETHASONE SODIUM PHOSPHATE 10 MG/ML IJ SOLN
5.0000 mg | Freq: Once | INTRAMUSCULAR | Status: AC
  Administered 2019-08-15: 5 mg via INTRAVENOUS

## 2019-08-15 MED ORDER — DIPHENHYDRAMINE HCL 50 MG/ML IJ SOLN
INTRAMUSCULAR | Status: AC
  Filled 2019-08-15: qty 1

## 2019-08-15 MED ORDER — SODIUM CHLORIDE 0.9 % IV SOLN
Freq: Once | INTRAVENOUS | Status: AC
  Filled 2019-08-15: qty 250

## 2019-08-15 MED ORDER — HEPARIN SOD (PORK) LOCK FLUSH 100 UNIT/ML IV SOLN
500.0000 [IU] | Freq: Once | INTRAVENOUS | Status: AC | PRN
  Administered 2019-08-15: 500 [IU]
  Filled 2019-08-15: qty 5

## 2019-08-15 MED ORDER — DIPHENHYDRAMINE HCL 50 MG/ML IJ SOLN
25.0000 mg | Freq: Once | INTRAMUSCULAR | Status: AC
  Administered 2019-08-15: 25 mg via INTRAVENOUS

## 2019-08-15 MED ORDER — SODIUM CHLORIDE 0.9 % IV SOLN
150.0000 mg | Freq: Once | INTRAVENOUS | Status: AC
  Administered 2019-08-15: 150 mg via INTRAVENOUS
  Filled 2019-08-15: qty 150

## 2019-08-15 MED ORDER — SODIUM CHLORIDE 0.9% FLUSH
10.0000 mL | INTRAVENOUS | Status: DC | PRN
  Administered 2019-08-15: 10 mL
  Filled 2019-08-15: qty 10

## 2019-08-15 MED ORDER — SODIUM CHLORIDE 0.9 % IV SOLN: 5.0000 mg | Freq: Once | INTRAVENOUS | Status: DC

## 2019-08-15 MED ORDER — PALONOSETRON HCL INJECTION 0.25 MG/5ML
0.2500 mg | Freq: Once | INTRAVENOUS | Status: AC
  Administered 2019-08-15: 0.25 mg via INTRAVENOUS

## 2019-08-15 MED ORDER — ATROPINE SULFATE 1 MG/ML IJ SOLN
INTRAMUSCULAR | Status: AC
  Filled 2019-08-15: qty 1

## 2019-08-15 MED ORDER — PALONOSETRON HCL INJECTION 0.25 MG/5ML
INTRAVENOUS | Status: AC
  Filled 2019-08-15: qty 5

## 2019-08-15 NOTE — Patient Instructions (Signed)

## 2019-08-15 NOTE — Patient Instructions (Signed)
[place] Discharge Instructions for Patients Receiving Chemotherapy  Today you received the following chemotherapy agent: Sacituzumab [name])  To help prevent nausea and vomiting after your treatment, we encourage you to take your nausea medication as directed by your MD.   If you develop nausea and vomiting that is not controlled by your nausea medication, call the clinic.   BELOW ARE SYMPTOMS THAT SHOULD BE REPORTED IMMEDIATELY:  *FEVER GREATER THAN 100.5 F  *CHILLS WITH OR WITHOUT FEVER  NAUSEA AND VOMITING THAT IS NOT CONTROLLED WITH YOUR NAUSEA MEDICATION  *UNUSUAL SHORTNESS OF BREATH  *UNUSUAL BRUISING OR BLEEDING  TENDERNESS IN MOUTH AND THROAT WITH OR WITHOUT PRESENCE OF ULCERS  *URINARY PROBLEMS  *BOWEL PROBLEMS  UNUSUAL RASH Items with * indicate a potential emergency and should be followed up as soon as possible.  Feel free to call the clinic should you have any questions or concerns. The clinic phone number is [phone].  Please show the [facility] at check-in to the Emergency Department and triage nurse.

## 2019-08-19 ENCOUNTER — Encounter: Payer: Self-pay | Admitting: *Deleted

## 2019-08-19 NOTE — Progress Notes (Signed)
 Patient Care Team: Badger, [name], MD as PCP - General (Family Medicine) Stuart, [name], RN as Oncology Nurse Navigator Martini, [name], RN as Oncology Nurse Navigator Serpe, [name], NP as Nurse Practitioner (Hospice and Palliative Medicine) Earnest Thalman, MD as Consulting Physician (Hematology and Oncology) Causey, Lindsey Cornetto, NP as Nurse Practitioner (Hematology and Oncology)  DIAGNOSIS:    ICD-10-CM   1. Malignant neoplasm of lower-outer quadrant of left breast of female, estrogen receptor negative (HCC)  C50.512    Z17.1     SUMMARY OF ONCOLOGIC HISTORY: 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, estrogen receptor negative (HCC)  10/20/2016 Mammogram   Mammogram and ultrasound of the left breast revealed 1.7 cm mass at 4:00 position, 6:30 position 5 x 4 x 4 mm mass, 6:00 position 5 cm nipple 7 x 6 x 11 mm, left axillary lymph node with thickened cortex, T1c N1 stage II a AJCC 8   10/24/2016 Initial Diagnosis   Left breast biopsy 6:30 position 3 cm from nipple: IDC grade 2, DCIS, ER 0%, PR 0%, Ki-67 15% HER-2 positive ratio 2.1; 4:00 position 3 cm from nipple: IDC grade 2, DCIS, ER 0%, PR 0%, Ki-67 35%, HER-2 positive ratio 2.02; left axillary lymph node biopsy positive   11/04/2016 - 02/17/2017 Neo-Adjuvant Chemotherapy   TCH Perjeta 6 cycles followed by Herceptin + Perjeta maintenance to be completed September 2019   11/30/2016 Genetic Testing   Negative genetic testing on the common hereditary cancer panel.  The Hereditary Gene Panel offered by [facility] includes sequencing and/or deletion duplication testing of the following 47 genes: APC, ATM, AXIN2, BARD1, BMPR1A, BRCA1, BRCA2, BRIP1, CDH1, CDK4, CDKN2A (p14ARF), CDKN2A (p16INK4a), CHEK2, CTNNA1, DICER1, EPCAM (Deletion/duplication testing only), GREM1 (promoter region deletion/duplication testing only), KIT, MEN1, MLH1, MSH2, MSH3, MSH6, MUTYH, NBN, NF1, NHTL1, PALB2, PDGFRA,  PMS2, POLD1, POLE, PTEN, RAD50, RAD51C, RAD51D, SDHB, SDHC, SDHD, SMAD4, SMARCA4. STK11, TP53, TSC1, TSC2, and VHL.  The following genes were evaluated for sequence changes only: SDHA and HOXB13 c.251G>A variant only. The report date is November 30, 2016.    03/27/2017 Surgery   Bilateral mastectomies: Left mastectomy: IDC grade 2 0.9 cm, nodes negative, right mastectomy benign, ER 0%, PR 0%, HER-2 positive ratio 2.6   05/08/2017 - 06/09/2017 Radiation Therapy   Adjuvant radiation therapy   10/23/2017 Miscellaneous   Neratinib discontinued after 4 weeks for severe diarrhea   07/25/2018 Relapse/Recurrence   MRI of right elbow showed bone lesion consistent with malignancy. PET scan showed bilateral pulmonary nodules and several lytic bone lesions compatible with metastatic disease. Brain MRI on 08/02/18 showed no evidence of metastatic disease.   08/02/2018 PET scan   Bilateral hypermetabolic lung nodules, LUL 1.3 cm with SUV 3.88, lingular nodule 1.4 cm SUV 3.7, central lingular nodule 1.2 cm SUV 9.76, right middle lobe nodule 1.5 cm SUV 9.9, lytic bone metastases inferior pubic ramus, sacrum, T12, right 11th rib.   08/08/2018 Procedure   Lung biopsy: metastatic carcinoma, HER-2 negative (0), ER/PR negative.   08/10/2018 -  Radiation Therapy   Palliative radiatio to the right humerus along the medial condyle   08/24/2018 - 09/05/2018 Radiation Therapy   Palliative radiation to the right 11th rib and right elbow   09/26/2018 - 12/04/2018 Chemotherapy   Carboplatin atezolizumab at [facility] with Dr. Lisa Carey on [facility] 043 clinical trial stopped because of new T5 metastases (toxicities included myopathy required prednisone, immune mediated thyroiditis), right upper   extremity DVT on apixaban   12/14/2018 - 04/10/2019 Chemotherapy   The patient had pegfilgrastim-bmez (ZIEXTENZO) injection 6 mg, 6 mg, Subcutaneous,  Once, 2 of 2 cycles Administration: 6 mg (03/02/2019), 6 mg (02/16/2019), 6 mg (03/16/2019),  6 mg (03/29/2019) eriBULin mesylate (HALAVEN) 2.7 mg in sodium chloride 0.9 % 100 mL chemo infusion, 1.42 mg/m2 = 2.65 mg, Intravenous,  Once, 4 of 4 cycles Dose modification: 1.1 mg/m2 (original dose 1.4 mg/m2, Cycle 2, Reason: Dose not tolerated, Comment: neutropenic fever), 0.7 mg/m2 (original dose 1.4 mg/m2, Cycle 4, Reason: Provider Judgment), 0.7 mg/m2 (original dose 1.4 mg/m2, Cycle 4, Reason: Provider Judgment) Administration: 2.7 mg (12/14/2018), 2.7 mg (12/21/2018), 2 mg (01/03/2019), 2 mg (01/10/2019), 1.35 mg (02/15/2019), 1.35 mg (03/01/2019), 1.35 mg (03/15/2019), 1.35 mg (03/28/2019)  for chemotherapy treatment.    12/17/2018 -  Radiation Therapy   Palliative radiation to sternal, sacral & pelvic lesions and SRS for T3 and C7-T1 lesions.   04/11/2019 -  Chemotherapy   The patient had dexamethasone (DECADRON) 4 MG tablet, 1 of 1 cycle, Start date: 04/01/2019, End date: 06/10/2019 palonosetron (ALOXI) injection 0.25 mg, 0.25 mg, Intravenous,  Once, 6 of 12 cycles Administration: 0.25 mg (04/11/2019), 0.25 mg (04/17/2019), 0.25 mg (05/02/2019), 0.25 mg (05/09/2019), 0.25 mg (05/23/2019), 0.25 mg (05/29/2019), 0.25 mg (06/13/2019), 0.25 mg (06/20/2019), 0.25 mg (07/05/2019), 0.25 mg (07/11/2019), 0.25 mg (08/15/2019) pegfilgrastim (NEULASTA) injection 6 mg, 6 mg, Subcutaneous, Once, 1 of 1 cycle Administration: 6 mg (04/18/2019) pegfilgrastim (NEULASTA ONPRO KIT) injection 6 mg, 6 mg, Subcutaneous, Once, 5 of 11 cycles Administration: 6 mg (05/29/2019), 6 mg (05/09/2019), 6 mg (06/20/2019), 6 mg (07/11/2019) fosaprepitant (EMEND) 150 mg in sodium chloride 0.9 % 145 mL IVPB, 150 mg, Intravenous,  Once, 6 of 12 cycles Administration: 150 mg (04/11/2019), 150 mg (04/17/2019), 150 mg (05/02/2019), 150 mg (05/09/2019), 150 mg (05/23/2019), 150 mg (05/29/2019), 150 mg (06/13/2019), 150 mg (06/20/2019), 150 mg (07/05/2019), 150 mg (07/11/2019), 150 mg (08/15/2019) sacituzumab govitecan-hziy (TRODELVY) 590 mg in sodium chloride 0.9 % 250  mL (1.9094 mg/mL) chemo infusion, 8 mg/kg = 590 mg (100 % of original dose 8 mg/kg), Intravenous,  Once, 6 of 12 cycles Dose modification: 8 mg/kg (original dose 8 mg/kg, Cycle 1, Reason: Provider Judgment), 4 mg/kg (original dose 4 mg/kg, Cycle 2, Reason: Other (see comments), Comment: split bags), 3 mg/kg (original dose 4 mg/kg, Cycle 2, Reason: Provider Judgment), 4 mg/kg (original dose 4 mg/kg, Cycle 2, Reason: Other (see comments), Comment: split bags) Administration: 590 mg (04/11/2019), 590 mg (04/17/2019), 590 mg (05/02/2019), 220 mg (05/09/2019), 220 mg (05/23/2019), 220 mg (05/29/2019), 220 mg (06/13/2019), 220 mg (06/20/2019), 220 mg (05/09/2019), 220 mg (05/23/2019), 220 mg (05/29/2019), 220 mg (06/13/2019), 220 mg (06/20/2019), 220 mg (07/05/2019), 220 mg (07/05/2019), 220 mg (07/11/2019), 220 mg (07/11/2019), 220 mg (08/15/2019), 220 mg (08/15/2019)  for chemotherapy treatment.    Bone metastases (HCC)  08/07/2018 Initial Diagnosis   Bone metastases (HCC)   12/14/2018 - 04/24/2019 Chemotherapy   The patient had pertuzumab (PERJETA) 420 mg in sodium chloride 0.9 % 250 mL chemo infusion, 420 mg (100 % of original dose 420 mg), Intravenous, Once, 5 of 6 cycles Dose modification: 420 mg (original dose 420 mg, Cycle 1, Reason: Provider Judgment) Administration: 420 mg (12/14/2018), 420 mg (01/10/2019), 420 mg (03/28/2019), 420 mg (01/30/2019), 420 mg (03/01/2019) trastuzumab-dkst (OGIVRI) 600 mg in sodium chloride 0.9 % 250 mL chemo infusion, 609 mg, Intravenous,  Once, 5 of 6 cycles Administration: 600 mg (12/14/2018), 450 mg (01/10/2019),  450 mg (03/28/2019), 450 mg (01/30/2019), 450 mg (03/01/2019)  for chemotherapy treatment.    12/17/2018 -  Radiation Therapy   Palliative radiation to sternal, sacral & pelvic lesions and SRS for T3 and C7-T1 lesions.     CHIEF COMPLIANT:  Follow-up of metastatic breast cancer,cycle6day6Trodelvy  INTERVAL HISTORY: Laura Anthony is a 44 y.o. with above-mentioned  history of metastatic breast cancer who is currently ontreatment withTrodelvy.She was admitted from 07/22/19-07/25/19 for COVID-19 after contracting it from her son. She was found to have COVID pneumonia and treated with Remdesivir. She presents to the clinic today for follow-up of her hospitalization and treatment.  She received chemotherapy last week and today she feels tired.  ALLERGIES:  is allergic to denosumab, statins, sumatriptan, and tape.  MEDICATIONS:  Current Outpatient Medications  Medication Sig Dispense Refill  . amoxicillin-clavulanate (AUGMENTIN) 875-125 MG tablet Take 1 tablet by mouth 2 (two) times daily. 14 tablet 0  . ELIQUIS 5 MG TABS tablet Take 1 tablet (5 mg total) by mouth 2 (two) times daily. 60 tablet 3  . gabapentin (NEURONTIN) 300 MG capsule Take 1 tablet by mouth in morning and afternoon, take 2 tablets by mouth at bedtime (Patient taking differently: Take 300-600 mg by mouth in the morning, at noon, and at bedtime. Take 1 tablet by mouth in morning and afternoon, take 2 tablets by mouth at bedtime) 360 capsule 3  . levothyroxine (SYNTHROID) 88 MCG tablet TAKE 1 TABLET (88 MCG TOTAL) BY MOUTH DAILY BEFORE BREAKFAST. 90 tablet 0  . loperamide (IMODIUM A-D) 2 MG tablet Take 2 at diarrhea onset, then 1 every 2hr until 12hrs with no BM. May take 2 every 4hrs at night. If diarrhea recurs repeat. (Patient taking differently: Take 2 mg by mouth 4 (four) times daily as needed for diarrhea or loose stools. Take 2 at diarrhea onset, then 1 every 2hr until 12hrs with no BM. May take 2 every 4hrs at night. If diarrhea recurs repeat.) 100 tablet 1  . loratadine (CLARITIN) 10 MG tablet Take 10 mg by mouth at bedtime.    Marland Kitchen LORazepam (ATIVAN) 0.5 MG tablet Take 1 tablet (0.5 mg total) by mouth every 6 (six) hours as needed for anxiety. 45 tablet 1  . methadone (DOLOPHINE) 10 MG tablet Take 1 tablet (10 mg total) by mouth every 12 (twelve) hours. 60 tablet 0  . naloxone (NARCAN) 4  MG/0.1ML LIQD nasal spray kit Spray once in one nostril prn overdose, may repeat x 1 1 each 1  . ondansetron (ZOFRAN) 8 MG tablet Take 1 tablet (8 mg total) by mouth 2 (two) times daily as needed. Start on the third day after chemotherapy. (Patient taking differently: Take 8 mg by mouth 2 (two) times daily as needed for nausea or vomiting. Start on the third day after chemotherapy.) 30 tablet 1  . oxyCODONE-acetaminophen (PERCOCET) 10-325 MG tablet Take 1 tablet by mouth every 4 (four) hours as needed for pain. 120 tablet 0  . pantoprazole (PROTONIX) 40 MG tablet TAKE 1 TABLET BY MOUTH TWICE A DAY 180 tablet 1  . prochlorperazine (COMPAZINE) 10 MG tablet Take 1 tablet (10 mg total) by mouth every 6 (six) hours as needed (Nausea or vomiting). 30 tablet 1  . venlafaxine XR (EFFEXOR-XR) 150 MG 24 hr capsule TAKE 1 CAPSULE (150 MG TOTAL) BY MOUTH DAILY WITH BREAKFAST. 90 capsule 3  . zolpidem (AMBIEN CR) 12.5 MG CR tablet Take 1 tablet (12.5 mg total) by mouth at bedtime as needed  for sleep. (Patient taking differently: Take 12.5 mg by mouth at bedtime. ) 30 tablet 3   No current facility-administered medications for this visit.    PHYSICAL EXAMINATION: ECOG PERFORMANCE STATUS: 1 - Symptomatic but completely ambulatory  Vitals:   08/20/19 1511  BP: 100/73  Pulse: (!) 109  Resp: 17  Temp: 98.5 F (36.9 C)  SpO2: 94%   Filed Weights   08/20/19 1511  Weight: 167 lb 4.8 oz (75.9 kg)    LABORATORY DATA:  I have reviewed the data as listed CMP Latest Ref Rng & Units 08/20/2019 08/15/2019 07/25/2019  Glucose 70 - 99 mg/dL 133(H) 66(L) 87  BUN 6 - 20 mg/dL '10 8 8  '$ Creatinine 0.44 - 1.00 mg/dL 0.82 0.77 0.52  Sodium 135 - 145 mmol/L 139 140 140  Potassium 3.5 - 5.1 mmol/L 5.0 3.7 4.0  Chloride 98 - 111 mmol/L 99 104 106  CO2 22 - 32 mmol/L '29 27 28  '$ Calcium 8.9 - 10.3 mg/dL 10.2 9.3 8.5(L)  Total Protein 6.5 - 8.1 g/dL 7.7 7.2 6.5  Total Bilirubin 0.3 - 1.2 mg/dL 0.4 0.5 0.6  Alkaline Phos  38 - 126 U/L 119 108 110  AST 15 - 41 U/L '18 18 28  '$ ALT 0 - 44 U/L '13 9 21    '$ Lab Results  Component Value Date   WBC 6.2 08/20/2019   HGB 11.9 (L) 08/20/2019   HCT 36.2 08/20/2019   MCV 97.1 08/20/2019   PLT 230 08/20/2019   NEUTROABS 4.4 08/20/2019    ASSESSMENT & PLAN:  Malignant neoplasm of lower-outer quadrant of left breast of female, estrogen receptor negative ([place]) 10/24/2016: Left breast biopsy 6:30 position 3 cm from nipple: IDC grade 2, DCIS, ER 0%, PR 0%, Ki-67 15% HER-2 positive ratio 2.1; 4:00 position 3 cm from nipple: IDC grade 2, DCIS, ER 0%, PR 0%, Ki-67 35%, HER-2 positive ratio 2.02 Lymph node biopsy positive  Treatment Summary: 1. Neoadjuvant chemotherapy with TCHPcompleted 02/17/2017 this would be followed by Herceptinand Perjetamaintenance for 1 yearcompleted September 2019 2.Bilateral mastectomies 03/28/2016:Bilateral mastectomies: Left mastectomy: IDC grade 2 0.9 cm, nodes negative, right mastectomy benign, ER 0%, PR 0%, HER-2 positive ratio 2.6 3.Adjuvant radiation4/08/2017 to 06/09/2017 4.Neratinib started 10/12/2017 discontinued due to diarrhea 5. Elbow fracture: Due to metastatic disease, palliative radiation therapy 6. Carboplatin atezolizumab at [facility] on a clinical trial [facility] 043 stopped for progression 12/04/2018 ---------------------------------------------------------------------------------------------------------------- Lung nodule biopsy: Metastatic breast cancer triple negative Current treatment: Sacituzumab-Govitecan days 1 and 8 every 3 weeks with Neulasta on day 9 started 04/11/2019, today is cycle6 day 8  Toxicities: 1.Fatigue: Manageable 3.Thrombocytopenia:Platelet count is up to 230 4.Anemia: Stable hemoglobin is 11.9  Bone scan 08/15/2019: Improvement in bone metastases.  Stable  COVID-19 hospitalization 07/22/2019-07/25/2019: Covid pneumonia treated with remdesivir Pain regimen: On methadone and Percocets and  nerve blocks with Dr. Maryjean Ka Osteonecrosis of jaw: Discontinued Xgeva.  Continue with Sacituzumab-Govitecan.     No orders of the defined types were placed in this encounter.  The patient has a good understanding of the overall plan. she agrees with it. she will call with any problems that may develop before the next visit here.  Total time spent: 30 mins including face to face time and time spent for planning, charting and coordination of care  Nicholas Lose, MD 08/20/2019  I, Cloyde Reams Dorshimer, am acting as scribe for Dr. Nicholas Lose.  I have reviewed the above documentation for accuracy and completeness, and I agree with the  above.

## 2019-08-20 ENCOUNTER — Other Ambulatory Visit: Payer: Self-pay

## 2019-08-20 ENCOUNTER — Inpatient Hospital Stay: Payer: 59

## 2019-08-20 ENCOUNTER — Inpatient Hospital Stay (HOSPITAL_BASED_OUTPATIENT_CLINIC_OR_DEPARTMENT_OTHER): Payer: 59 | Admitting: Hematology and Oncology

## 2019-08-20 DIAGNOSIS — Z95828 Presence of other vascular implants and grafts: Secondary | ICD-10-CM

## 2019-08-20 DIAGNOSIS — C50512 Malignant neoplasm of lower-outer quadrant of left female breast: Secondary | ICD-10-CM

## 2019-08-20 DIAGNOSIS — Z7189 Other specified counseling: Secondary | ICD-10-CM

## 2019-08-20 DIAGNOSIS — G893 Neoplasm related pain (acute) (chronic): Secondary | ICD-10-CM

## 2019-08-20 DIAGNOSIS — C7951 Secondary malignant neoplasm of bone: Secondary | ICD-10-CM

## 2019-08-20 DIAGNOSIS — Z171 Estrogen receptor negative status [ER-]: Secondary | ICD-10-CM

## 2019-08-20 LAB — CBC WITH DIFFERENTIAL (CANCER CENTER ONLY)
Abs Immature Granulocytes: 0.02 10*3/uL (ref 0.00–0.07)
Basophils Absolute: 0.1 10*3/uL (ref 0.0–0.1)
Basophils Relative: 1 %
Eosinophils Absolute: 0.1 10*3/uL (ref 0.0–0.5)
Eosinophils Relative: 2 %
HCT: 36.2 % (ref 36.0–46.0)
Hemoglobin: 11.9 g/dL — ABNORMAL LOW (ref 12.0–15.0)
Immature Granulocytes: 0 %
Lymphocytes Relative: 18 %
Lymphs Abs: 1.1 10*3/uL (ref 0.7–4.0)
MCH: 31.9 pg (ref 26.0–34.0)
MCHC: 32.9 g/dL (ref 30.0–36.0)
MCV: 97.1 fL (ref 80.0–100.0)
Monocytes Absolute: 0.5 10*3/uL (ref 0.1–1.0)
Monocytes Relative: 8 %
Neutro Abs: 4.4 10*3/uL (ref 1.7–7.7)
Neutrophils Relative %: 71 %
Platelet Count: 230 10*3/uL (ref 150–400)
RBC: 3.73 MIL/uL — ABNORMAL LOW (ref 3.87–5.11)
RDW: 15.2 % (ref 11.5–15.5)
WBC Count: 6.2 10*3/uL (ref 4.0–10.5)
nRBC: 0 % (ref 0.0–0.2)

## 2019-08-20 LAB — CMP (CANCER CENTER ONLY)
ALT: 13 U/L (ref 0–44)
AST: 18 U/L (ref 15–41)
Albumin: 3.1 g/dL — ABNORMAL LOW (ref 3.5–5.0)
Alkaline Phosphatase: 119 U/L (ref 38–126)
Anion gap: 11 (ref 5–15)
BUN: 10 mg/dL (ref 6–20)
CO2: 29 mmol/L (ref 22–32)
Calcium: 10.2 mg/dL (ref 8.9–10.3)
Chloride: 99 mmol/L (ref 98–111)
Creatinine: 0.82 mg/dL (ref 0.44–1.00)
GFR, Est AFR Am: >60 mL/min (ref >60)
GFR, Estimated: >60 mL/min (ref >60)
Glucose, Bld: 133 mg/dL — ABNORMAL HIGH (ref 70–99)
Potassium: 5 mmol/L (ref 3.5–5.1)
Sodium: 139 mmol/L (ref 135–145)
Total Bilirubin: 0.4 mg/dL (ref 0.3–1.2)
Total Protein: 7.7 g/dL (ref 6.5–8.1)

## 2019-08-20 LAB — MAGNESIUM: Magnesium: 2.4 mg/dL (ref 1.7–2.4)

## 2019-08-20 LAB — PHOSPHORUS: Phosphorus: 3.9 mg/dL (ref 2.5–4.6)

## 2019-08-20 MED ORDER — SODIUM CHLORIDE 0.9% FLUSH
10.0000 mL | INTRAVENOUS | Status: DC | PRN
  Filled 2019-08-20: qty 10

## 2019-08-20 NOTE — Patient Instructions (Signed)

## 2019-08-20 NOTE — Progress Notes (Signed)
Patient requested labs be drawn from arm. I drew them.

## 2019-08-20 NOTE — Assessment & Plan Note (Signed)
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 Neoadjuvant chemotherapy with TCHPcompleted 02/17/2017 this would be followed by Herceptinand Perjetamaintenance for 1 yearcompleted September 2019 2.Bilateral mastectomies 03/28/2016:Bilateral mastectomies: Left mastectomy: IDC grade 2 0.9 cm, nodes negative, right mastectomy benign, ER 0%, PR 0%, HER-2 positive ratio 2.6 3.Adjuvant radiation4/08/2017 to 06/09/2017 4.Neratinib started 10/12/2017 discontinued due to diarrhea 5. Elbow fracture: Due to metastatic disease, palliative radiation therapy 6. Carboplatin atezolizumab at [facility] on a clinical trial [facility] 043 stopped for progression 12/04/2018 ---------------------------------------------------------------------------------------------------------------- Lung nodule biopsy: Metastatic breast cancer triple negative Current treatment: Sacituzumab-Govitecan days 1 and 8 every 3 weeks with Neulasta on day 9 started 04/11/2019, today is cycle6 day 8  Toxicities: 1.Hoarseness of voice: Infusion reaction: We will proceed to give the treatment over 4 hours.She has done much better since we made the change 2.Fatigue 3.Thrombocytopenia:Platelet count is up to 157 4.Anemia: Stable monitoring closely.Today's hemoglobin is 10.1  CT CAP 05/28/2019: Reduction in the size of the dominant pulmonary nodules. (1.6 cm to 1.3 cm) another nodule 1.9 cm to 1.6 cm. Stable lytic lesions right sternum, T12 vertebral body, left iliac bone, sacrum and right inferior pubic ramus. Stable healing fracture right posterolateral eighth rib  06/10/2019: Brain MRI: New 5 mm focus of enhancement posterior left frontal lobe (brain MRI was done because she has had  frequent falls) Stereotactic radiosurgery is being planned  COVID-19 hospitalization 07/22/2019-07/25/2019: Covid pneumonia treated with remdesivir Pain regimen: On methadone and Percocets and nerve blocks with Dr. Maryjean Ka Osteonecrosis of jaw: Discontinued Xgeva.  Continue with Sacituzumab-Govitecan.

## 2019-08-21 ENCOUNTER — Other Ambulatory Visit: Payer: Self-pay

## 2019-08-21 ENCOUNTER — Ambulatory Visit: Payer: 59 | Admitting: Hematology and Oncology

## 2019-08-21 ENCOUNTER — Other Ambulatory Visit: Payer: 59

## 2019-08-21 ENCOUNTER — Inpatient Hospital Stay: Payer: 59

## 2019-08-21 VITALS — BP 108/77 | HR 98 | Temp 98.7°F | Resp 18

## 2019-08-21 DIAGNOSIS — Z7189 Other specified counseling: Secondary | ICD-10-CM

## 2019-08-21 DIAGNOSIS — C50512 Malignant neoplasm of lower-outer quadrant of left female breast: Secondary | ICD-10-CM | POA: Diagnosis not present

## 2019-08-21 DIAGNOSIS — C7951 Secondary malignant neoplasm of bone: Secondary | ICD-10-CM

## 2019-08-21 DIAGNOSIS — Z171 Estrogen receptor negative status [ER-]: Secondary | ICD-10-CM

## 2019-08-21 MED ORDER — ACETAMINOPHEN 325 MG PO TABS
650.0000 mg | ORAL_TABLET | Freq: Once | ORAL | Status: AC
  Administered 2019-08-21: 650 mg via ORAL

## 2019-08-21 MED ORDER — SODIUM CHLORIDE 0.9% FLUSH
10.0000 mL | INTRAVENOUS | Status: DC | PRN
  Administered 2019-08-21: 10 mL
  Filled 2019-08-21: qty 10

## 2019-08-21 MED ORDER — DIPHENHYDRAMINE HCL 50 MG/ML IJ SOLN
25.0000 mg | Freq: Once | INTRAMUSCULAR | Status: AC
  Administered 2019-08-21: 25 mg via INTRAVENOUS

## 2019-08-21 MED ORDER — PEGFILGRASTIM 6 MG/0.6ML ~~LOC~~ PSKT
PREFILLED_SYRINGE | SUBCUTANEOUS | Status: AC
  Filled 2019-08-21: qty 0.6

## 2019-08-21 MED ORDER — SODIUM CHLORIDE 0.9 % IV SOLN
Freq: Once | INTRAVENOUS | Status: AC
  Filled 2019-08-21: qty 250

## 2019-08-21 MED ORDER — PALONOSETRON HCL INJECTION 0.25 MG/5ML
0.2500 mg | Freq: Once | INTRAVENOUS | Status: AC
  Administered 2019-08-21: 0.25 mg via INTRAVENOUS

## 2019-08-21 MED ORDER — FAMOTIDINE IN NACL 20-0.9 MG/50ML-% IV SOLN
20.0000 mg | Freq: Once | INTRAVENOUS | Status: AC
  Administered 2019-08-21: 20 mg via INTRAVENOUS

## 2019-08-21 MED ORDER — DIPHENHYDRAMINE HCL 50 MG/ML IJ SOLN
INTRAMUSCULAR | Status: AC
  Filled 2019-08-21: qty 1

## 2019-08-21 MED ORDER — ACETAMINOPHEN 325 MG PO TABS
ORAL_TABLET | ORAL | Status: AC
  Filled 2019-08-21: qty 2

## 2019-08-21 MED ORDER — PALONOSETRON HCL INJECTION 0.25 MG/5ML
INTRAVENOUS | Status: AC
  Filled 2019-08-21: qty 5

## 2019-08-21 MED ORDER — SODIUM CHLORIDE 0.9 % IV SOLN
150.0000 mg | Freq: Once | INTRAVENOUS | Status: AC
  Administered 2019-08-21: 150 mg via INTRAVENOUS
  Filled 2019-08-21: qty 150

## 2019-08-21 MED ORDER — DEXAMETHASONE SODIUM PHOSPHATE 10 MG/ML IJ SOLN
5.0000 mg | Freq: Once | INTRAMUSCULAR | Status: AC
  Administered 2019-08-21: 5 mg via INTRAVENOUS

## 2019-08-21 MED ORDER — SODIUM CHLORIDE 0.9 % IV SOLN
3.0000 mg/kg | Freq: Once | INTRAVENOUS | Status: AC
  Administered 2019-08-21: 220 mg via INTRAVENOUS
  Filled 2019-08-21: qty 22

## 2019-08-21 MED ORDER — FAMOTIDINE IN NACL 20-0.9 MG/50ML-% IV SOLN
INTRAVENOUS | Status: AC
  Filled 2019-08-21: qty 50

## 2019-08-21 MED ORDER — DEXAMETHASONE SODIUM PHOSPHATE 10 MG/ML IJ SOLN
INTRAMUSCULAR | Status: AC
  Filled 2019-08-21: qty 1

## 2019-08-21 MED ORDER — ATROPINE SULFATE 1 MG/ML IJ SOLN: 0.5000 mg | Freq: Once | INTRAMUSCULAR | Status: DC | PRN

## 2019-08-21 MED ORDER — HEPARIN SOD (PORK) LOCK FLUSH 100 UNIT/ML IV SOLN
500.0000 [IU] | Freq: Once | INTRAVENOUS | Status: AC | PRN
  Administered 2019-08-21: 500 [IU]
  Filled 2019-08-21: qty 5

## 2019-08-21 MED ORDER — PEGFILGRASTIM 6 MG/0.6ML ~~LOC~~ PSKT
6.0000 mg | PREFILLED_SYRINGE | Freq: Once | SUBCUTANEOUS | Status: AC
  Administered 2019-08-21: 6 mg via SUBCUTANEOUS

## 2019-08-22 ENCOUNTER — Inpatient Hospital Stay: Payer: 59

## 2019-08-22 ENCOUNTER — Telehealth: Payer: Self-pay | Admitting: Hematology and Oncology

## 2019-08-22 ENCOUNTER — Encounter: Payer: Self-pay | Admitting: *Deleted

## 2019-08-22 NOTE — Telephone Encounter (Signed)
No 7/20 los, no changes made to pt schedule  

## 2019-08-26 ENCOUNTER — Telehealth: Payer: Self-pay | Admitting: Radiation Oncology

## 2019-08-29 ENCOUNTER — Other Ambulatory Visit: Payer: Self-pay | Admitting: Hematology and Oncology

## 2019-09-05 ENCOUNTER — Other Ambulatory Visit: Payer: Self-pay

## 2019-09-05 ENCOUNTER — Inpatient Hospital Stay: Payer: 59

## 2019-09-05 ENCOUNTER — Inpatient Hospital Stay: Payer: 59 | Attending: Hematology and Oncology

## 2019-09-05 VITALS — BP 106/71 | HR 97 | Temp 99.1°F | Resp 16

## 2019-09-05 DIAGNOSIS — Z5112 Encounter for antineoplastic immunotherapy: Secondary | ICD-10-CM | POA: Diagnosis not present

## 2019-09-05 DIAGNOSIS — C50512 Malignant neoplasm of lower-outer quadrant of left female breast: Secondary | ICD-10-CM | POA: Diagnosis not present

## 2019-09-05 DIAGNOSIS — Z7189 Other specified counseling: Secondary | ICD-10-CM

## 2019-09-05 DIAGNOSIS — Z9221 Personal history of antineoplastic chemotherapy: Secondary | ICD-10-CM | POA: Insufficient documentation

## 2019-09-05 DIAGNOSIS — Z171 Estrogen receptor negative status [ER-]: Secondary | ICD-10-CM

## 2019-09-05 DIAGNOSIS — Z923 Personal history of irradiation: Secondary | ICD-10-CM | POA: Insufficient documentation

## 2019-09-05 DIAGNOSIS — Z9013 Acquired absence of bilateral breasts and nipples: Secondary | ICD-10-CM | POA: Diagnosis not present

## 2019-09-05 DIAGNOSIS — C7951 Secondary malignant neoplasm of bone: Secondary | ICD-10-CM | POA: Diagnosis not present

## 2019-09-05 DIAGNOSIS — Z95828 Presence of other vascular implants and grafts: Secondary | ICD-10-CM

## 2019-09-05 DIAGNOSIS — Z8616 Personal history of COVID-19: Secondary | ICD-10-CM | POA: Insufficient documentation

## 2019-09-05 LAB — CBC WITH DIFFERENTIAL (CANCER CENTER ONLY)
Abs Immature Granulocytes: 0.01 10*3/uL (ref 0.00–0.07)
Basophils Absolute: 0 10*3/uL (ref 0.0–0.1)
Basophils Relative: 1 %
Eosinophils Absolute: 0.1 10*3/uL (ref 0.0–0.5)
Eosinophils Relative: 3 %
HCT: 35.1 % — ABNORMAL LOW (ref 36.0–46.0)
Hemoglobin: 11.5 g/dL — ABNORMAL LOW (ref 12.0–15.0)
Immature Granulocytes: 0 %
Lymphocytes Relative: 28 %
Lymphs Abs: 1.1 10*3/uL (ref 0.7–4.0)
MCH: 31.5 pg (ref 26.0–34.0)
MCHC: 32.8 g/dL (ref 30.0–36.0)
MCV: 96.2 fL (ref 80.0–100.0)
Monocytes Absolute: 0.7 10*3/uL (ref 0.1–1.0)
Monocytes Relative: 16 %
Neutro Abs: 2 10*3/uL (ref 1.7–7.7)
Neutrophils Relative %: 52 %
Platelet Count: 192 10*3/uL (ref 150–400)
RBC: 3.65 MIL/uL — ABNORMAL LOW (ref 3.87–5.11)
RDW: 15.9 % — ABNORMAL HIGH (ref 11.5–15.5)
WBC Count: 4 10*3/uL (ref 4.0–10.5)
nRBC: 0 % (ref 0.0–0.2)

## 2019-09-05 LAB — CMP (CANCER CENTER ONLY)
ALT: 14 U/L (ref 0–44)
AST: 22 U/L (ref 15–41)
Albumin: 3.3 g/dL — ABNORMAL LOW (ref 3.5–5.0)
Alkaline Phosphatase: 130 U/L — ABNORMAL HIGH (ref 38–126)
Anion gap: 10 (ref 5–15)
BUN: 7 mg/dL (ref 6–20)
CO2: 25 mmol/L (ref 22–32)
Calcium: 10 mg/dL (ref 8.9–10.3)
Chloride: 104 mmol/L (ref 98–111)
Creatinine: 0.8 mg/dL (ref 0.44–1.00)
GFR, Est AFR Am: >60 mL/min (ref >60)
GFR, Estimated: >60 mL/min (ref >60)
Glucose, Bld: 131 mg/dL — ABNORMAL HIGH (ref 70–99)
Potassium: 3.7 mmol/L (ref 3.5–5.1)
Sodium: 139 mmol/L (ref 135–145)
Total Bilirubin: 0.4 mg/dL (ref 0.3–1.2)
Total Protein: 7.1 g/dL (ref 6.5–8.1)

## 2019-09-05 LAB — MAGNESIUM: Magnesium: 2 mg/dL (ref 1.7–2.4)

## 2019-09-05 LAB — PHOSPHORUS: Phosphorus: 5.1 mg/dL — ABNORMAL HIGH (ref 2.5–4.6)

## 2019-09-05 MED ORDER — DIPHENHYDRAMINE HCL 50 MG/ML IJ SOLN
INTRAMUSCULAR | Status: AC
  Filled 2019-09-05: qty 1

## 2019-09-05 MED ORDER — HEPARIN SOD (PORK) LOCK FLUSH 100 UNIT/ML IV SOLN
500.0000 [IU] | Freq: Once | INTRAVENOUS | Status: AC | PRN
  Administered 2019-09-05: 500 [IU]
  Filled 2019-09-05: qty 5

## 2019-09-05 MED ORDER — ACETAMINOPHEN 325 MG PO TABS
650.0000 mg | ORAL_TABLET | Freq: Once | ORAL | Status: AC
  Administered 2019-09-05: 650 mg via ORAL

## 2019-09-05 MED ORDER — SODIUM CHLORIDE 0.9 % IV SOLN
3.0000 mg/kg | Freq: Once | INTRAVENOUS | Status: AC
  Administered 2019-09-05: 220 mg via INTRAVENOUS
  Filled 2019-09-05: qty 22

## 2019-09-05 MED ORDER — SODIUM CHLORIDE 0.9% FLUSH
10.0000 mL | INTRAVENOUS | Status: DC | PRN
  Administered 2019-09-05: 10 mL
  Filled 2019-09-05: qty 10

## 2019-09-05 MED ORDER — SODIUM CHLORIDE 0.9 % IV SOLN
150.0000 mg | Freq: Once | INTRAVENOUS | Status: AC
  Administered 2019-09-05: 150 mg via INTRAVENOUS
  Filled 2019-09-05: qty 150

## 2019-09-05 MED ORDER — PALONOSETRON HCL INJECTION 0.25 MG/5ML
0.2500 mg | Freq: Once | INTRAVENOUS | Status: AC
  Administered 2019-09-05: 0.25 mg via INTRAVENOUS

## 2019-09-05 MED ORDER — DEXAMETHASONE SODIUM PHOSPHATE 10 MG/ML IJ SOLN
INTRAMUSCULAR | Status: AC
  Filled 2019-09-05: qty 1

## 2019-09-05 MED ORDER — FAMOTIDINE IN NACL 20-0.9 MG/50ML-% IV SOLN
INTRAVENOUS | Status: AC
  Filled 2019-09-05: qty 50

## 2019-09-05 MED ORDER — DEXAMETHASONE SODIUM PHOSPHATE 10 MG/ML IJ SOLN
5.0000 mg | Freq: Once | INTRAMUSCULAR | Status: AC
  Administered 2019-09-05: 5 mg via INTRAVENOUS

## 2019-09-05 MED ORDER — FAMOTIDINE IN NACL 20-0.9 MG/50ML-% IV SOLN
20.0000 mg | Freq: Once | INTRAVENOUS | Status: AC
  Administered 2019-09-05: 20 mg via INTRAVENOUS

## 2019-09-05 MED ORDER — SODIUM CHLORIDE 0.9 % IV SOLN
Freq: Once | INTRAVENOUS | Status: AC
  Filled 2019-09-05: qty 250

## 2019-09-05 MED ORDER — DIPHENHYDRAMINE HCL 50 MG/ML IJ SOLN
25.0000 mg | Freq: Once | INTRAMUSCULAR | Status: AC
  Administered 2019-09-05: 25 mg via INTRAVENOUS

## 2019-09-05 MED ORDER — ACETAMINOPHEN 325 MG PO TABS
ORAL_TABLET | ORAL | Status: AC
  Filled 2019-09-05: qty 2

## 2019-09-05 MED ORDER — PALONOSETRON HCL INJECTION 0.25 MG/5ML
INTRAVENOUS | Status: AC
  Filled 2019-09-05: qty 5

## 2019-09-05 MED ORDER — ATROPINE SULFATE 1 MG/ML IJ SOLN: 0.5000 mg | Freq: Once | INTRAMUSCULAR | Status: DC | PRN

## 2019-09-05 NOTE — Patient Instructions (Signed)
[facility] Discharge Instructions for Patients Receiving Chemotherapy  Today you received the following chemotherapy agents: sacituzumab govitecan-hziy.  To help prevent nausea and vomiting after your treatment, we encourage you to take your nausea medication as directed.   If you develop nausea and vomiting that is not controlled by your nausea medication, call the clinic.   BELOW ARE SYMPTOMS THAT SHOULD BE REPORTED IMMEDIATELY:  *FEVER GREATER THAN 100.5 F  *CHILLS WITH OR WITHOUT FEVER  NAUSEA AND VOMITING THAT IS NOT CONTROLLED WITH YOUR NAUSEA MEDICATION  *UNUSUAL SHORTNESS OF BREATH  *UNUSUAL BRUISING OR BLEEDING  TENDERNESS IN MOUTH AND THROAT WITH OR WITHOUT PRESENCE OF ULCERS  *URINARY PROBLEMS  *BOWEL PROBLEMS  UNUSUAL RASH Items with * indicate a potential emergency and should be followed up as soon as possible.  Feel free to call the clinic should you have any questions or concerns. The clinic phone number is [phone].  Please show the [place] at check-in to the Emergency Department and triage nurse.

## 2019-09-11 NOTE — Progress Notes (Signed)
Patient Care Team: [name], MD as PCP - General (Family Medicine) Mauro Kaufmann, RN as Oncology Nurse Navigator Rockwell Germany, RN as Oncology Nurse Navigator Serpe, Aletha Halim, NP as Nurse Practitioner (Hospice and Palliative Medicine) Nicholas Lose, MD as Consulting Physician (Hematology and Oncology) Gardenia Phlegm, NP as Nurse Practitioner (Hematology and Oncology)  DIAGNOSIS:    ICD-10-CM   1. Malignant neoplasm of lower-outer quadrant of left breast of female, estrogen receptor negative ([place])  C50.512 methadone (DOLOPHINE) 10 MG tablet   Z17.1     SUMMARY OF ONCOLOGIC HISTORY: 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, estrogen receptor negative ([place])  10/20/2016 Mammogram   Mammogram and ultrasound of the left breast revealed 1.7 cm mass at 4:00 position, 6:30 position 5 x 4 x 4 mm mass, 6:00 position 5 cm nipple 7 x 6 x 11 mm, left axillary lymph node with thickened cortex, T1c N1 stage II a AJCC 8   10/24/2016 Initial Diagnosis   Left breast biopsy 6:30 position 3 cm from nipple: IDC grade 2, DCIS, ER 0%, PR 0%, Ki-67 15% HER-2 positive ratio 2.1; 4:00 position 3 cm from nipple: IDC grade 2, DCIS, ER 0%, PR 0%, Ki-67 35%, HER-2 positive ratio 2.02; left axillary lymph node biopsy positive   11/04/2016 - 02/17/2017 Neo-Adjuvant Chemotherapy   TCH Perjeta 6 cycles followed by Herceptin + Perjeta maintenance to be completed September 2019   11/30/2016 Genetic Testing   Negative genetic testing on the common hereditary cancer panel.  The Hereditary Gene Panel offered by [facility] includes sequencing and/or deletion duplication testing of the following 47 genes: APC, ATM, AXIN2, BARD1, BMPR1A, BRCA1, BRCA2, BRIP1, CDH1, CDK4, CDKN2A (p14ARF), CDKN2A (p16INK4a), CHEK2, CTNNA1, DICER1, EPCAM (Deletion/duplication testing only), GREM1 (promoter region deletion/duplication testing only), KIT, MEN1, MLH1, MSH2, MSH3, MSH6,  MUTYH, NBN, NF1, NHTL1, PALB2, PDGFRA, PMS2, POLD1, POLE, PTEN, RAD50, RAD51C, RAD51D, SDHB, SDHC, SDHD, SMAD4, SMARCA4. STK11, TP53, TSC1, TSC2, and VHL.  The following genes were evaluated for sequence changes only: SDHA and HOXB13 c.251G>A variant only. The report date is November 30, 2016.    03/27/2017 Surgery   Bilateral mastectomies: Left mastectomy: IDC grade 2 0.9 cm, nodes negative, right mastectomy benign, ER 0%, PR 0%, HER-2 positive ratio 2.6   05/08/2017 - 06/09/2017 Radiation Therapy   Adjuvant radiation therapy   10/23/2017 Miscellaneous   Neratinib discontinued after 4 weeks for severe diarrhea   07/25/2018 Relapse/Recurrence   MRI of right elbow showed bone lesion consistent with malignancy. PET scan showed bilateral pulmonary nodules and several lytic bone lesions compatible with metastatic disease. Brain MRI on 08/02/18 showed no evidence of metastatic disease.   08/02/2018 PET scan   Bilateral hypermetabolic lung nodules, LUL 1.3 cm with SUV 3.88, lingular nodule 1.4 cm SUV 3.7, central lingular nodule 1.2 cm SUV 9.76, right middle lobe nodule 1.5 cm SUV 9.9, lytic bone metastases inferior pubic ramus, sacrum, T12, right 11th rib.   08/08/2018 Procedure   Lung biopsy: metastatic carcinoma, HER-2 negative (0), ER/PR negative.   08/10/2018 -  Radiation Therapy   Palliative radiatio to the right humerus along the medial condyle   08/24/2018 - 09/05/2018 Radiation Therapy   Palliative radiation to the right 11th rib and right elbow   09/26/2018 - 12/04/2018 Chemotherapy   Carboplatin atezolizumab at [facility] with Dr. Janan Halter on [place] clinical trial stopped because of new T5 metastases (toxicities included myopathy required prednisone, immune  mediated thyroiditis), right upper extremity DVT on apixaban   12/14/2018 - 04/10/2019 Chemotherapy   The patient had pegfilgrastim-bmez (ZIEXTENZO) injection 6 mg, 6 mg, Subcutaneous,  Once, 2 of 2 cycles Administration: 6 mg  (03/02/2019), 6 mg (02/16/2019), 6 mg (03/16/2019), 6 mg (03/29/2019) eriBULin mesylate (HALAVEN) 2.7 mg in sodium chloride 0.9 % 100 mL chemo infusion, 1.42 mg/m2 = 2.65 mg, Intravenous,  Once, 4 of 4 cycles Dose modification: 1.1 mg/m2 (original dose 1.4 mg/m2, Cycle 2, Reason: Dose not tolerated, Comment: neutropenic fever), 0.7 mg/m2 (original dose 1.4 mg/m2, Cycle 4, Reason: Provider Judgment), 0.7 mg/m2 (original dose 1.4 mg/m2, Cycle 4, Reason: Provider Judgment) Administration: 2.7 mg (12/14/2018), 2.7 mg (12/21/2018), 2 mg (01/03/2019), 2 mg (01/10/2019), 1.35 mg (02/15/2019), 1.35 mg (03/01/2019), 1.35 mg (03/15/2019), 1.35 mg (03/28/2019)  for chemotherapy treatment.    12/17/2018 -  Radiation Therapy   Palliative radiation to sternal, sacral & pelvic lesions and SRS for T3 and C7-T1 lesions.   04/11/2019 -  Chemotherapy   The patient had dexamethasone (DECADRON) 4 MG tablet, 1 of 1 cycle, Start date: 04/01/2019, End date: 06/10/2019 palonosetron (ALOXI) injection 0.25 mg, 0.25 mg, Intravenous,  Once, 7 of 12 cycles Administration: 0.25 mg (04/11/2019), 0.25 mg (04/17/2019), 0.25 mg (05/02/2019), 0.25 mg (05/09/2019), 0.25 mg (05/23/2019), 0.25 mg (05/29/2019), 0.25 mg (06/13/2019), 0.25 mg (06/20/2019), 0.25 mg (07/05/2019), 0.25 mg (07/11/2019), 0.25 mg (08/15/2019), 0.25 mg (08/21/2019), 0.25 mg (09/05/2019) pegfilgrastim (NEULASTA) injection 6 mg, 6 mg, Subcutaneous, Once, 1 of 1 cycle Administration: 6 mg (04/18/2019) pegfilgrastim (NEULASTA ONPRO KIT) injection 6 mg, 6 mg, Subcutaneous, Once, 6 of 11 cycles Administration: 6 mg (05/29/2019), 6 mg (05/09/2019), 6 mg (06/20/2019), 6 mg (07/11/2019), 6 mg (08/21/2019) fosaprepitant (EMEND) 150 mg in sodium chloride 0.9 % 145 mL IVPB, 150 mg, Intravenous,  Once, 7 of 12 cycles Administration: 150 mg (04/11/2019), 150 mg (04/17/2019), 150 mg (05/02/2019), 150 mg (05/09/2019), 150 mg (05/23/2019), 150 mg (05/29/2019), 150 mg (06/13/2019), 150 mg (06/20/2019), 150 mg (07/05/2019), 150  mg (07/11/2019), 150 mg (08/15/2019), 150 mg (08/21/2019), 150 mg (09/05/2019) sacituzumab govitecan-hziy (TRODELVY) 590 mg in sodium chloride 0.9 % 250 mL (1.9094 mg/mL) chemo infusion, 8 mg/kg = 590 mg (100 % of original dose 8 mg/kg), Intravenous,  Once, 7 of 12 cycles Dose modification: 8 mg/kg (original dose 8 mg/kg, Cycle 1, Reason: Provider Judgment), 4 mg/kg (original dose 4 mg/kg, Cycle 2, Reason: Other (see comments), Comment: split bags), 3 mg/kg (original dose 4 mg/kg, Cycle 2, Reason: Provider Judgment), 4 mg/kg (original dose 4 mg/kg, Cycle 2, Reason: Other (see comments), Comment: split bags) Administration: 590 mg (04/11/2019), 590 mg (04/17/2019), 590 mg (05/02/2019), 220 mg (05/09/2019), 220 mg (05/23/2019), 220 mg (05/29/2019), 220 mg (06/13/2019), 220 mg (06/20/2019), 220 mg (05/09/2019), 220 mg (05/23/2019), 220 mg (05/29/2019), 220 mg (06/13/2019), 220 mg (06/20/2019), 220 mg (07/05/2019), 220 mg (07/05/2019), 220 mg (07/11/2019), 220 mg (07/11/2019), 220 mg (08/15/2019), 220 mg (08/15/2019), 220 mg (08/21/2019), 220 mg (08/21/2019), 220 mg (09/05/2019), 220 mg (09/05/2019)  for chemotherapy treatment.    Bone metastases ([place])  08/07/2018 Initial Diagnosis   Bone metastases ([place])   12/14/2018 - 04/24/2019 Chemotherapy   The patient had pertuzumab (PERJETA) 420 mg in sodium chloride 0.9 % 250 mL chemo infusion, 420 mg (100 % of original dose 420 mg), Intravenous, Once, 5 of 6 cycles Dose modification: 420 mg (original dose 420 mg, Cycle 1, Reason: Provider Judgment) Administration: 420 mg (12/14/2018), 420 mg (01/10/2019), 420 mg (03/28/2019), 420 mg (01/30/2019), 420  mg (03/01/2019) trastuzumab-dkst (OGIVRI) 600 mg in sodium chloride 0.9 % 250 mL chemo infusion, 609 mg, Intravenous,  Once, 5 of 6 cycles Administration: 600 mg (12/14/2018), 450 mg (01/10/2019), 450 mg (03/28/2019), 450 mg (01/30/2019), 450 mg (03/01/2019)  for chemotherapy treatment.    12/17/2018 -  Radiation Therapy   Palliative radiation to  sternal, sacral & pelvic lesions and SRS for T3 and C7-T1 lesions.     CHIEF COMPLIANT: Follow-up of metastatic breast cancer,cycle7day8Trodelvy  INTERVAL HISTORY: Laura Anthony is a 44 y.o. with above-mentioned history of metastatic breast cancer who is currently ontreatment withTrodelvy.She presents to the clinic today for treatment.  She continues to have fatigue related to chemotherapy.  Pain is under reasonable control.  Denies any nausea or vomiting.  She is able to tolerate the current dosage of Trodelvy fairly well.  ALLERGIES:  is allergic to denosumab, statins, sumatriptan, and tape.  MEDICATIONS:  Current Outpatient Medications  Medication Sig Dispense Refill  . ELIQUIS 5 MG TABS tablet Take 1 tablet (5 mg total) by mouth 2 (two) times daily. 60 tablet 3  . gabapentin (NEURONTIN) 300 MG capsule Take 1 tablet by mouth in morning and afternoon, take 2 tablets by mouth at bedtime (Patient taking differently: Take 300-600 mg by mouth in the morning, at noon, and at bedtime. Take 1 tablet by mouth in morning and afternoon, take 2 tablets by mouth at bedtime) 360 capsule 3  . levothyroxine (SYNTHROID) 88 MCG tablet TAKE 1 TABLET (88 MCG TOTAL) BY MOUTH DAILY BEFORE BREAKFAST. 90 tablet 0  . loperamide (IMODIUM A-D) 2 MG tablet Take 2 at diarrhea onset, then 1 every 2hr until 12hrs with no BM. May take 2 every 4hrs at night. If diarrhea recurs repeat. (Patient taking differently: Take 2 mg by mouth 4 (four) times daily as needed for diarrhea or loose stools. Take 2 at diarrhea onset, then 1 every 2hr until 12hrs with no BM. May take 2 every 4hrs at night. If diarrhea recurs repeat.) 100 tablet 1  . loratadine (CLARITIN) 10 MG tablet Take 10 mg by mouth at bedtime.    Marland Kitchen LORazepam (ATIVAN) 0.5 MG tablet Take 1 tablet (0.5 mg total) by mouth every 6 (six) hours as needed for anxiety. 45 tablet 1  . methadone (DOLOPHINE) 10 MG tablet Take 1 tablet (10 mg total) by mouth every 8  (eight) hours. 90 tablet 0  . naloxone (NARCAN) 4 MG/0.1ML LIQD nasal spray kit Spray once in one nostril prn overdose, may repeat x 1 1 each 1  . ondansetron (ZOFRAN) 8 MG tablet Take 1 tablet (8 mg total) by mouth 2 (two) times daily as needed. Start on the third day after chemotherapy. (Patient taking differently: Take 8 mg by mouth 2 (two) times daily as needed for nausea or vomiting. Start on the third day after chemotherapy.) 30 tablet 1  . oxyCODONE-acetaminophen (PERCOCET) 10-325 MG tablet Take 1 tablet by mouth every 4 (four) hours as needed for pain. 120 tablet 0  . pantoprazole (PROTONIX) 40 MG tablet TAKE 1 TABLET BY MOUTH TWICE A DAY 180 tablet 1  . prochlorperazine (COMPAZINE) 10 MG tablet Take 1 tablet (10 mg total) by mouth every 6 (six) hours as needed (Nausea or vomiting). 30 tablet 1  . venlafaxine XR (EFFEXOR-XR) 150 MG 24 hr capsule TAKE 1 CAPSULE (150 MG TOTAL) BY MOUTH DAILY WITH BREAKFAST. 90 capsule 3  . zolpidem (AMBIEN CR) 12.5 MG CR tablet Take 1 tablet (12.5 mg total) by  mouth at bedtime as needed for sleep. (Patient taking differently: Take 12.5 mg by mouth at bedtime. ) 30 tablet 3   No current facility-administered medications for this visit.    PHYSICAL EXAMINATION: ECOG PERFORMANCE STATUS: 2 - Symptomatic, <50% confined to bed  Vitals:   09/12/19 0832  BP: 101/72  Pulse: 98  Resp: 19  Temp: 97.7 F (36.5 C)  SpO2: 97%   Filed Weights   09/12/19 0832  Weight: 171 lb 8 oz (77.8 kg)    LABORATORY DATA:  I have reviewed the data as listed CMP Latest Ref Rng & Units 09/05/2019 08/20/2019 08/15/2019  Glucose 70 - 99 mg/dL 131(H) 133(H) 66(L)  BUN 6 - 20 mg/dL '7 10 8  '$ Creatinine 0.44 - 1.00 mg/dL 0.80 0.82 0.77  Sodium 135 - 145 mmol/L 139 139 140  Potassium 3.5 - 5.1 mmol/L 3.7 5.0 3.7  Chloride 98 - 111 mmol/L 104 99 104  CO2 22 - 32 mmol/L '25 29 27  '$ Calcium 8.9 - 10.3 mg/dL 10.0 10.2 9.3  Total Protein 6.5 - 8.1 g/dL 7.1 7.7 7.2  Total Bilirubin 0.3  - 1.2 mg/dL 0.4 0.4 0.5  Alkaline Phos 38 - 126 U/L 130(H) 119 108  AST 15 - 41 U/L '22 18 18  '$ ALT 0 - 44 U/L '14 13 9    '$ Lab Results  Component Value Date   WBC 2.7 (L) 09/12/2019   HGB 10.8 (L) 09/12/2019   HCT 33.2 (L) 09/12/2019   MCV 97.9 09/12/2019   PLT 165 09/12/2019   NEUTROABS 1.2 (L) 09/12/2019    ASSESSMENT & PLAN:  Malignant neoplasm of lower-outer quadrant of left breast of female, estrogen receptor negative ([place]) 10/24/2016: Left breast biopsy 6:30 position 3 cm from nipple: IDC grade 2, DCIS, ER 0%, PR 0%, Ki-67 15% HER-2 positive ratio 2.1; 4:00 position 3 cm from nipple: IDC grade 2, DCIS, ER 0%, PR 0%, Ki-67 35%, HER-2 positive ratio 2.02 Lymph node biopsy positive  Treatment Summary: 1. Neoadjuvant chemotherapy with TCHPcompleted 02/17/2017 this would be followed by Herceptinand Perjetamaintenance for 1 yearcompleted September 2019 2.Bilateral mastectomies 03/28/2016:Bilateral mastectomies: Left mastectomy: IDC grade 2 0.9 cm, nodes negative, right mastectomy benign, ER 0%, PR 0%, HER-2 positive ratio 2.6 3.Adjuvant radiation4/08/2017 to 06/09/2017 4.Neratinib started 10/12/2017 discontinued due to diarrhea 5. Elbow fracture: Due to metastatic disease, palliative radiation therapy 6. Carboplatin atezolizumab at [facility] on a clinical trial [facility] 043 stopped for progression 12/04/2018 ---------------------------------------------------------------------------------------------------------------- Lung nodule biopsy: Metastatic breast cancer triple negative Current treatment: Sacituzumab-Govitecan days 1 and 8 every 3 weeks with Neulasta on day 9 started 04/11/2019, today is cycle7 day 8  Toxicities: 1.Fatigue: Manageable 2. mild nausea 3.Thrombocytopenia:Platelet count 165 4.Anemia: Stable hemoglobin is 10.8  Bone scan 08/15/2019: Improvement in bone metastases.  Stable  COVID-19 hospitalization 07/22/2019-07/25/2019: Covid pneumonia  treated with remdesivir Pain regimen: On methadone and Percocets and nerve blocks with Dr. Maryjean Ka She has not required Percocets at all. I refilled her methadone today.  Osteonecrosis of jaw: Discontinued Xgeva.  Continue with Sacituzumab-Govitecan.    No orders of the defined types were placed in this encounter.  The patient has a good understanding of the overall plan. she agrees with it. she will call with any problems that may develop before the next visit here.  Total time spent: 30 mins including face to face time and time spent for planning, charting and coordination of care  Nicholas Lose, MD 09/12/2019  I, Molly Dorshimer, am acting as Education administrator  for Dr. Nicholas Lose.  I have reviewed the above documentation for accuracy and completeness, and I agree with the above.

## 2019-09-12 ENCOUNTER — Encounter: Payer: Self-pay | Admitting: *Deleted

## 2019-09-12 ENCOUNTER — Inpatient Hospital Stay (HOSPITAL_BASED_OUTPATIENT_CLINIC_OR_DEPARTMENT_OTHER): Payer: 59 | Admitting: Hematology and Oncology

## 2019-09-12 ENCOUNTER — Inpatient Hospital Stay: Payer: 59

## 2019-09-12 ENCOUNTER — Other Ambulatory Visit: Payer: Self-pay

## 2019-09-12 DIAGNOSIS — C50512 Malignant neoplasm of lower-outer quadrant of left female breast: Secondary | ICD-10-CM | POA: Diagnosis not present

## 2019-09-12 DIAGNOSIS — Z7189 Other specified counseling: Secondary | ICD-10-CM

## 2019-09-12 DIAGNOSIS — G893 Neoplasm related pain (acute) (chronic): Secondary | ICD-10-CM

## 2019-09-12 DIAGNOSIS — Z171 Estrogen receptor negative status [ER-]: Secondary | ICD-10-CM

## 2019-09-12 DIAGNOSIS — C7951 Secondary malignant neoplasm of bone: Secondary | ICD-10-CM

## 2019-09-12 DIAGNOSIS — Z95828 Presence of other vascular implants and grafts: Secondary | ICD-10-CM

## 2019-09-12 LAB — PHOSPHORUS: Phosphorus: 4.8 mg/dL — ABNORMAL HIGH (ref 2.5–4.6)

## 2019-09-12 LAB — CBC WITH DIFFERENTIAL (CANCER CENTER ONLY)
Abs Immature Granulocytes: 0.02 10*3/uL (ref 0.00–0.07)
Basophils Absolute: 0 10*3/uL (ref 0.0–0.1)
Basophils Relative: 1 %
Eosinophils Absolute: 0.1 10*3/uL (ref 0.0–0.5)
Eosinophils Relative: 5 %
HCT: 33.2 % — ABNORMAL LOW (ref 36.0–46.0)
Hemoglobin: 10.8 g/dL — ABNORMAL LOW (ref 12.0–15.0)
Immature Granulocytes: 1 %
Lymphocytes Relative: 35 %
Lymphs Abs: 0.9 10*3/uL (ref 0.7–4.0)
MCH: 31.9 pg (ref 26.0–34.0)
MCHC: 32.5 g/dL (ref 30.0–36.0)
MCV: 97.9 fL (ref 80.0–100.0)
Monocytes Absolute: 0.4 10*3/uL (ref 0.1–1.0)
Monocytes Relative: 15 %
Neutro Abs: 1.2 10*3/uL — ABNORMAL LOW (ref 1.7–7.7)
Neutrophils Relative %: 43 %
Platelet Count: 165 10*3/uL (ref 150–400)
RBC: 3.39 MIL/uL — ABNORMAL LOW (ref 3.87–5.11)
RDW: 15.4 % (ref 11.5–15.5)
WBC Count: 2.7 10*3/uL — ABNORMAL LOW (ref 4.0–10.5)
nRBC: 0 % (ref 0.0–0.2)

## 2019-09-12 LAB — CMP (CANCER CENTER ONLY)
ALT: 14 U/L (ref 0–44)
AST: 17 U/L (ref 15–41)
Albumin: 3.1 g/dL — ABNORMAL LOW (ref 3.5–5.0)
Alkaline Phosphatase: 110 U/L (ref 38–126)
Anion gap: 9 (ref 5–15)
BUN: 11 mg/dL (ref 6–20)
CO2: 25 mmol/L (ref 22–32)
Calcium: 9.9 mg/dL (ref 8.9–10.3)
Chloride: 105 mmol/L (ref 98–111)
Creatinine: 0.79 mg/dL (ref 0.44–1.00)
GFR, Est AFR Am: >60 mL/min (ref >60)
GFR, Estimated: >60 mL/min (ref >60)
Glucose, Bld: 102 mg/dL — ABNORMAL HIGH (ref 70–99)
Potassium: 3.6 mmol/L (ref 3.5–5.1)
Sodium: 139 mmol/L (ref 135–145)
Total Bilirubin: 0.3 mg/dL (ref 0.3–1.2)
Total Protein: 6.7 g/dL (ref 6.5–8.1)

## 2019-09-12 LAB — MAGNESIUM: Magnesium: 1.8 mg/dL (ref 1.7–2.4)

## 2019-09-12 MED ORDER — SODIUM CHLORIDE 0.9% FLUSH
10.0000 mL | INTRAVENOUS | Status: DC | PRN
  Administered 2019-09-12: 10 mL
  Filled 2019-09-12: qty 10

## 2019-09-12 MED ORDER — ACETAMINOPHEN 325 MG PO TABS
ORAL_TABLET | ORAL | Status: AC
  Filled 2019-09-12: qty 2

## 2019-09-12 MED ORDER — DEXAMETHASONE SODIUM PHOSPHATE 10 MG/ML IJ SOLN
5.0000 mg | Freq: Once | INTRAMUSCULAR | Status: AC
  Administered 2019-09-12: 5 mg via INTRAVENOUS

## 2019-09-12 MED ORDER — PEGFILGRASTIM 6 MG/0.6ML ~~LOC~~ PSKT
6.0000 mg | PREFILLED_SYRINGE | Freq: Once | SUBCUTANEOUS | Status: AC
  Administered 2019-09-12: 6 mg via SUBCUTANEOUS

## 2019-09-12 MED ORDER — ACETAMINOPHEN 325 MG PO TABS
650.0000 mg | ORAL_TABLET | Freq: Once | ORAL | Status: AC
  Administered 2019-09-12: 650 mg via ORAL

## 2019-09-12 MED ORDER — PEGFILGRASTIM 6 MG/0.6ML ~~LOC~~ PSKT
PREFILLED_SYRINGE | SUBCUTANEOUS | Status: AC
  Filled 2019-09-12: qty 0.6

## 2019-09-12 MED ORDER — FAMOTIDINE IN NACL 20-0.9 MG/50ML-% IV SOLN
20.0000 mg | Freq: Once | INTRAVENOUS | Status: AC
  Administered 2019-09-12: 20 mg via INTRAVENOUS

## 2019-09-12 MED ORDER — SODIUM CHLORIDE 0.9 % IV SOLN
3.0000 mg/kg | Freq: Once | INTRAVENOUS | Status: AC
  Administered 2019-09-12: 220 mg via INTRAVENOUS
  Filled 2019-09-12: qty 22

## 2019-09-12 MED ORDER — PALONOSETRON HCL INJECTION 0.25 MG/5ML
0.2500 mg | Freq: Once | INTRAVENOUS | Status: AC
  Administered 2019-09-12: 0.25 mg via INTRAVENOUS

## 2019-09-12 MED ORDER — DEXAMETHASONE SODIUM PHOSPHATE 10 MG/ML IJ SOLN
INTRAMUSCULAR | Status: AC
  Filled 2019-09-12: qty 1

## 2019-09-12 MED ORDER — HEPARIN SOD (PORK) LOCK FLUSH 100 UNIT/ML IV SOLN
500.0000 [IU] | Freq: Once | INTRAVENOUS | Status: AC | PRN
  Administered 2019-09-12: 500 [IU]
  Filled 2019-09-12: qty 5

## 2019-09-12 MED ORDER — ATROPINE SULFATE 1 MG/ML IJ SOLN
INTRAMUSCULAR | Status: AC
  Filled 2019-09-12: qty 1

## 2019-09-12 MED ORDER — DIPHENHYDRAMINE HCL 50 MG/ML IJ SOLN
INTRAMUSCULAR | Status: AC
  Filled 2019-09-12: qty 1

## 2019-09-12 MED ORDER — SODIUM CHLORIDE 0.9 % IV SOLN
Freq: Once | INTRAVENOUS | Status: AC
  Filled 2019-09-12: qty 250

## 2019-09-12 MED ORDER — METHADONE HCL 10 MG PO TABS: 10.0000 mg | ORAL_TABLET | Freq: Three times a day (TID) | ORAL | 0 refills | Status: DC

## 2019-09-12 MED ORDER — SODIUM CHLORIDE 0.9 % IV SOLN
150.0000 mg | Freq: Once | INTRAVENOUS | Status: AC
  Administered 2019-09-12: 150 mg via INTRAVENOUS
  Filled 2019-09-12: qty 150

## 2019-09-12 MED ORDER — DIPHENHYDRAMINE HCL 50 MG/ML IJ SOLN
25.0000 mg | Freq: Once | INTRAMUSCULAR | Status: AC
  Administered 2019-09-12: 25 mg via INTRAVENOUS

## 2019-09-12 MED ORDER — ATROPINE SULFATE 1 MG/ML IJ SOLN: 0.5000 mg | Freq: Once | INTRAMUSCULAR | Status: DC | PRN

## 2019-09-12 MED ORDER — FAMOTIDINE IN NACL 20-0.9 MG/50ML-% IV SOLN
INTRAVENOUS | Status: AC
  Filled 2019-09-12: qty 50

## 2019-09-12 MED ORDER — PALONOSETRON HCL INJECTION 0.25 MG/5ML
INTRAVENOUS | Status: AC
  Filled 2019-09-12: qty 5

## 2019-09-12 NOTE — Patient Instructions (Signed)

## 2019-09-12 NOTE — Assessment & Plan Note (Signed)
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 Neoadjuvant chemotherapy with TCHPcompleted 02/17/2017 this would be followed by Herceptinand Perjetamaintenance for 1 yearcompleted September 2019 2.Bilateral mastectomies 03/28/2016:Bilateral mastectomies: Left mastectomy: IDC grade 2 0.9 cm, nodes negative, right mastectomy benign, ER 0%, PR 0%, HER-2 positive ratio 2.6 3.Adjuvant radiation4/08/2017 to 06/09/2017 4.Neratinib started 10/12/2017 discontinued due to diarrhea 5. Elbow fracture: Due to metastatic disease, palliative radiation therapy 6. Carboplatin atezolizumab at [facility] on a clinical trial [facility] 043 stopped for progression 12/04/2018 ---------------------------------------------------------------------------------------------------------------- Lung nodule biopsy: Metastatic breast cancer triple negative Current treatment: Sacituzumab-Govitecan days 1 and 8 every 3 weeks with Neulasta on day 9 started 04/11/2019, today is cycle7 day 8  Toxicities: 1.Fatigue: Manageable 3.Thrombocytopenia:Platelet count is up to 230 4.Anemia: Stable hemoglobin is 11.9  Bone scan 08/15/2019: Improvement in bone metastases.  Stable  COVID-19 hospitalization 07/22/2019-07/25/2019: Covid pneumonia treated with remdesivir Pain regimen: On methadone and Percocets and nerve blocks with Dr. Maryjean Ka Osteonecrosis of jaw: Discontinued Xgeva.  Continue with Sacituzumab-Govitecan.

## 2019-09-12 NOTE — Patient Instructions (Signed)
[facility] Discharge Instructions for Patients Receiving Chemotherapy  Today you received the following chemotherapy agents Trodelvy  To help prevent nausea and vomiting after your treatment, we encourage you to take your nausea medication as directed.    If you develop nausea and vomiting that is not controlled by your nausea medication, call the clinic.   BELOW ARE SYMPTOMS THAT SHOULD BE REPORTED IMMEDIATELY:  *FEVER GREATER THAN 100.5 F  *CHILLS WITH OR WITHOUT FEVER  NAUSEA AND VOMITING THAT IS NOT CONTROLLED WITH YOUR NAUSEA MEDICATION  *UNUSUAL SHORTNESS OF BREATH  *UNUSUAL BRUISING OR BLEEDING  TENDERNESS IN MOUTH AND THROAT WITH OR WITHOUT PRESENCE OF ULCERS  *URINARY PROBLEMS  *BOWEL PROBLEMS  UNUSUAL RASH Items with * indicate a potential emergency and should be followed up as soon as possible.  Feel free to call the clinic should you have any questions or concerns. The clinic phone number is [phone].  Please show the CHEMO ALERT CARD at check-in to the Emergency Department and triage nurse.   

## 2019-09-14 NOTE — Progress Notes (Signed)
  Radiation Oncology         [phone] ________________________________  Name: Veena Sturgess MRN: [id number]  Date: 07/04/2019  DOB: May 12, 1975  End of Treatment Note  Diagnosis:   Brain metastasis     Indication for treatment:  palliative       Radiation treatment dates:   07/04/19  Site/dose:    ExacTrac, 3 dca beams max dose = 126.4% PTV1 Rt Frontal 52mm  Narrative: The patient tolerated radiation treatment well.   There were no signs of acute toxicity after treatment.  Plan: The patient has completed radiation treatment. The patient will return to radiation oncology clinic for routine followup in one month. I advised the patient to call or return sooner if they have any questions or concerns related to their recovery or treatment. ________________________________  ------------------------------------------------  Jodelle Gross, MD, PhD

## 2019-09-16 ENCOUNTER — Telehealth: Payer: Self-pay | Admitting: Hematology and Oncology

## 2019-09-16 NOTE — Telephone Encounter (Signed)
No 8/12 los, no changes made to pt schedule  

## 2019-09-24 ENCOUNTER — Telehealth: Payer: Self-pay

## 2019-09-24 ENCOUNTER — Other Ambulatory Visit: Payer: Self-pay

## 2019-09-24 ENCOUNTER — Encounter: Payer: Self-pay | Admitting: Radiation Oncology

## 2019-09-24 NOTE — Telephone Encounter (Signed)
Spoke with patient in regards to telephone visit with Shona Simpson PA Monday the 30th at 2:00pm. Patient verbalized understanding of appointment date and time. Meaningful use questions were reviewed. TM

## 2019-09-25 ENCOUNTER — Other Ambulatory Visit: Payer: 59

## 2019-09-26 ENCOUNTER — Encounter: Payer: Self-pay | Admitting: *Deleted

## 2019-09-26 ENCOUNTER — Inpatient Hospital Stay: Payer: 59

## 2019-09-26 ENCOUNTER — Inpatient Hospital Stay: Admission: RE | Admit: 2019-09-26 | Payer: 59 | Source: Ambulatory Visit

## 2019-09-26 ENCOUNTER — Other Ambulatory Visit: Payer: Self-pay | Admitting: Hematology and Oncology

## 2019-09-26 ENCOUNTER — Other Ambulatory Visit: Payer: Self-pay

## 2019-09-26 VITALS — BP 96/71 | HR 79 | Temp 99.2°F | Resp 18 | Wt 164.2 lb

## 2019-09-26 DIAGNOSIS — Z7189 Other specified counseling: Secondary | ICD-10-CM

## 2019-09-26 DIAGNOSIS — C50512 Malignant neoplasm of lower-outer quadrant of left female breast: Secondary | ICD-10-CM

## 2019-09-26 DIAGNOSIS — Z95828 Presence of other vascular implants and grafts: Secondary | ICD-10-CM

## 2019-09-26 DIAGNOSIS — Z171 Estrogen receptor negative status [ER-]: Secondary | ICD-10-CM

## 2019-09-26 DIAGNOSIS — C7951 Secondary malignant neoplasm of bone: Secondary | ICD-10-CM

## 2019-09-26 DIAGNOSIS — G893 Neoplasm related pain (acute) (chronic): Secondary | ICD-10-CM

## 2019-09-26 LAB — CMP (CANCER CENTER ONLY)
ALT: 6 U/L (ref 0–44)
AST: 13 U/L — ABNORMAL LOW (ref 15–41)
Albumin: 3.2 g/dL — ABNORMAL LOW (ref 3.5–5.0)
Alkaline Phosphatase: 136 U/L — ABNORMAL HIGH (ref 38–126)
Anion gap: 7 (ref 5–15)
BUN: 7 mg/dL (ref 6–20)
CO2: 30 mmol/L (ref 22–32)
Calcium: 10.2 mg/dL (ref 8.9–10.3)
Chloride: 102 mmol/L (ref 98–111)
Creatinine: 0.82 mg/dL (ref 0.44–1.00)
GFR, Est AFR Am: >60 mL/min (ref >60)
GFR, Estimated: >60 mL/min (ref >60)
Glucose, Bld: 107 mg/dL — ABNORMAL HIGH (ref 70–99)
Potassium: 4 mmol/L (ref 3.5–5.1)
Sodium: 139 mmol/L (ref 135–145)
Total Bilirubin: 0.5 mg/dL (ref 0.3–1.2)
Total Protein: 7 g/dL (ref 6.5–8.1)

## 2019-09-26 LAB — CBC WITH DIFFERENTIAL (CANCER CENTER ONLY)
Abs Immature Granulocytes: 0.02 10*3/uL (ref 0.00–0.07)
Basophils Absolute: 0 10*3/uL (ref 0.0–0.1)
Basophils Relative: 1 %
Eosinophils Absolute: 0.1 10*3/uL (ref 0.0–0.5)
Eosinophils Relative: 1 %
HCT: 33.8 % — ABNORMAL LOW (ref 36.0–46.0)
Hemoglobin: 11.1 g/dL — ABNORMAL LOW (ref 12.0–15.0)
Immature Granulocytes: 0 %
Lymphocytes Relative: 17 %
Lymphs Abs: 0.8 10*3/uL (ref 0.7–4.0)
MCH: 32.2 pg (ref 26.0–34.0)
MCHC: 32.8 g/dL (ref 30.0–36.0)
MCV: 98 fL (ref 80.0–100.0)
Monocytes Absolute: 0.6 10*3/uL (ref 0.1–1.0)
Monocytes Relative: 13 %
Neutro Abs: 3.1 10*3/uL (ref 1.7–7.7)
Neutrophils Relative %: 68 %
Platelet Count: 208 10*3/uL (ref 150–400)
RBC: 3.45 MIL/uL — ABNORMAL LOW (ref 3.87–5.11)
RDW: 15.9 % — ABNORMAL HIGH (ref 11.5–15.5)
WBC Count: 4.5 10*3/uL (ref 4.0–10.5)
nRBC: 0 % (ref 0.0–0.2)

## 2019-09-26 LAB — MAGNESIUM: Magnesium: 1.9 mg/dL (ref 1.7–2.4)

## 2019-09-26 LAB — PHOSPHORUS: Phosphorus: 5.9 mg/dL — ABNORMAL HIGH (ref 2.5–4.6)

## 2019-09-26 MED ORDER — ACETAMINOPHEN 325 MG PO TABS
ORAL_TABLET | ORAL | Status: AC
  Filled 2019-09-26: qty 2

## 2019-09-26 MED ORDER — DIPHENHYDRAMINE HCL 50 MG/ML IJ SOLN
25.0000 mg | Freq: Once | INTRAMUSCULAR | Status: AC
  Administered 2019-09-26: 25 mg via INTRAVENOUS

## 2019-09-26 MED ORDER — FAMOTIDINE IN NACL 20-0.9 MG/50ML-% IV SOLN
20.0000 mg | Freq: Once | INTRAVENOUS | Status: AC
  Administered 2019-09-26: 20 mg via INTRAVENOUS

## 2019-09-26 MED ORDER — FAMOTIDINE IN NACL 20-0.9 MG/50ML-% IV SOLN
INTRAVENOUS | Status: AC
  Filled 2019-09-26: qty 50

## 2019-09-26 MED ORDER — DEXAMETHASONE SODIUM PHOSPHATE 10 MG/ML IJ SOLN
INTRAMUSCULAR | Status: AC
  Filled 2019-09-26: qty 1

## 2019-09-26 MED ORDER — SODIUM CHLORIDE 0.9 % IV SOLN
3.0000 mg/kg | Freq: Once | INTRAVENOUS | Status: AC
  Administered 2019-09-26: 220 mg via INTRAVENOUS
  Filled 2019-09-26: qty 22

## 2019-09-26 MED ORDER — SODIUM CHLORIDE 0.9% FLUSH
10.0000 mL | INTRAVENOUS | Status: DC | PRN
  Administered 2019-09-26: 10 mL
  Filled 2019-09-26: qty 10

## 2019-09-26 MED ORDER — PALONOSETRON HCL INJECTION 0.25 MG/5ML
0.2500 mg | Freq: Once | INTRAVENOUS | Status: AC
  Administered 2019-09-26: 0.25 mg via INTRAVENOUS

## 2019-09-26 MED ORDER — HEPARIN SOD (PORK) LOCK FLUSH 100 UNIT/ML IV SOLN
500.0000 [IU] | Freq: Once | INTRAVENOUS | Status: DC | PRN
  Filled 2019-09-26: qty 5

## 2019-09-26 MED ORDER — SODIUM CHLORIDE 0.9% FLUSH
10.0000 mL | INTRAVENOUS | Status: DC | PRN
  Filled 2019-09-26: qty 10

## 2019-09-26 MED ORDER — ATROPINE SULFATE 1 MG/ML IJ SOLN
INTRAMUSCULAR | Status: AC
  Filled 2019-09-26: qty 1

## 2019-09-26 MED ORDER — SODIUM CHLORIDE 0.9 % IV SOLN
150.0000 mg | Freq: Once | INTRAVENOUS | Status: AC
  Administered 2019-09-26: 150 mg via INTRAVENOUS
  Filled 2019-09-26: qty 150

## 2019-09-26 MED ORDER — PALONOSETRON HCL INJECTION 0.25 MG/5ML
INTRAVENOUS | Status: AC
  Filled 2019-09-26: qty 5

## 2019-09-26 MED ORDER — ACETAMINOPHEN 325 MG PO TABS
650.0000 mg | ORAL_TABLET | Freq: Once | ORAL | Status: AC
  Administered 2019-09-26: 650 mg via ORAL

## 2019-09-26 MED ORDER — PROMETHAZINE HCL 25 MG PO TABS: 25.0000 mg | ORAL_TABLET | Freq: Four times a day (QID) | ORAL | 3 refills | Status: DC | PRN

## 2019-09-26 MED ORDER — DEXAMETHASONE SODIUM PHOSPHATE 10 MG/ML IJ SOLN
5.0000 mg | Freq: Once | INTRAMUSCULAR | Status: AC
  Administered 2019-09-26: 5 mg via INTRAVENOUS

## 2019-09-26 MED ORDER — DIPHENHYDRAMINE HCL 50 MG/ML IJ SOLN
INTRAMUSCULAR | Status: AC
  Filled 2019-09-26: qty 1

## 2019-09-26 MED ORDER — SODIUM CHLORIDE 0.9 % IV SOLN
Freq: Once | INTRAVENOUS | Status: AC
  Filled 2019-09-26: qty 250

## 2019-09-26 NOTE — Patient Instructions (Signed)
[facility] Discharge Instructions for Patients Receiving Chemotherapy  Today you received the following chemotherapy agents Trodelvy  To help prevent nausea and vomiting after your treatment, we encourage you to take your nausea medication as directed.    If you develop nausea and vomiting that is not controlled by your nausea medication, call the clinic.   BELOW ARE SYMPTOMS THAT SHOULD BE REPORTED IMMEDIATELY:  *FEVER GREATER THAN 100.5 F  *CHILLS WITH OR WITHOUT FEVER  NAUSEA AND VOMITING THAT IS NOT CONTROLLED WITH YOUR NAUSEA MEDICATION  *UNUSUAL SHORTNESS OF BREATH  *UNUSUAL BRUISING OR BLEEDING  TENDERNESS IN MOUTH AND THROAT WITH OR WITHOUT PRESENCE OF ULCERS  *URINARY PROBLEMS  *BOWEL PROBLEMS  UNUSUAL RASH Items with * indicate a potential emergency and should be followed up as soon as possible.  Feel free to call the clinic should you have any questions or concerns. The clinic phone number is [phone].  Please show the CHEMO ALERT CARD at check-in to the Emergency Department and triage nurse.   

## 2019-09-26 NOTE — Progress Notes (Signed)
1030 - Pt states she is having increased generalized aching & soreness, has a hard time getting up & down, getting out of her car.  Also having increased nausea, is taking compazine at home but it makes her very tired & she is fatigued already.  Has zofran available but has had some issues with constipation.  Dr. Lindi Adie informed of the above, is sending prescription for phenergan to pt's pharmacy.

## 2019-09-30 ENCOUNTER — Telehealth: Payer: Self-pay

## 2019-09-30 ENCOUNTER — Encounter: Payer: Self-pay | Admitting: Radiation Oncology

## 2019-09-30 ENCOUNTER — Inpatient Hospital Stay
Admission: RE | Admit: 2019-09-30 | Discharge: 2019-09-30 | Disposition: A | Discharge status: Home / Self Care | Payer: Self-pay | Source: Ambulatory Visit | Attending: Radiation Oncology | Admitting: Radiation Oncology

## 2019-09-30 ENCOUNTER — Ambulatory Visit
Admission: RE | Admit: 2019-09-30 | Discharge: 2019-09-30 | Disposition: A | Discharge status: Home / Self Care | Payer: 59 | Source: Ambulatory Visit | Attending: Radiation Oncology | Admitting: Radiation Oncology

## 2019-09-30 ENCOUNTER — Other Ambulatory Visit: Payer: Self-pay | Admitting: Hematology and Oncology

## 2019-09-30 ENCOUNTER — Other Ambulatory Visit: Payer: Self-pay

## 2019-09-30 DIAGNOSIS — C7931 Secondary malignant neoplasm of brain: Secondary | ICD-10-CM

## 2019-09-30 DIAGNOSIS — C7949 Secondary malignant neoplasm of other parts of nervous system: Secondary | ICD-10-CM

## 2019-09-30 DIAGNOSIS — C7951 Secondary malignant neoplasm of bone: Secondary | ICD-10-CM

## 2019-09-30 MED ORDER — HEPARIN SOD (PORK) LOCK FLUSH 100 UNIT/ML IV SOLN
500.0000 [IU] | Freq: Once | INTRAVENOUS | Status: AC
  Administered 2019-09-30: 500 [IU] via INTRAVENOUS

## 2019-09-30 MED ORDER — SODIUM CHLORIDE 0.9% FLUSH: 10.0000 mL | INTRAVENOUS | Status: DC | PRN

## 2019-09-30 MED ORDER — GADOBENATE DIMEGLUMINE 529 MG/ML IV SOLN
15.0000 mL | Freq: Once | INTRAVENOUS | Status: AC | PRN
  Administered 2019-09-30: 15 mL via INTRAVENOUS

## 2019-09-30 MED FILL — Loratadine Tab 10 MG: ORAL | Qty: 10 | Status: AC

## 2019-09-30 MED FILL — Amoxicillin & K Clavulanate Tab 875-125 MG: ORAL | Qty: 1 | Status: AC

## 2019-09-30 NOTE — Telephone Encounter (Signed)
Spoke with patient in regards to telephone visit with Shona Simpson PA on 10/01/19 at 8:30am. Patient verbalized understanding of appointment date and time. Meaningful use questions were reviewed TM

## 2019-10-01 ENCOUNTER — Ambulatory Visit
Admission: RE | Admit: 2019-10-01 | Discharge: 2019-10-01 | Disposition: A | Discharge status: Home / Self Care | Payer: 59 | Source: Ambulatory Visit | Attending: Radiation Oncology | Admitting: Radiation Oncology

## 2019-10-01 ENCOUNTER — Encounter: Payer: Self-pay | Admitting: *Deleted

## 2019-10-01 DIAGNOSIS — C50919 Malignant neoplasm of unspecified site of unspecified female breast: Secondary | ICD-10-CM

## 2019-10-01 NOTE — Progress Notes (Signed)
Radiation Oncology         [phone] ________________________________  Outpatient Follow Up - Conducted via telephone due to current COVID-19 concerns for limiting patient exposure  I spoke with the patient to conduct this consult visit via telephone to spare the patient unnecessary potential exposure in the healthcare setting during the current COVID-19 pandemic. The patient was notified in advance and was offered a [place] meeting to allow for face to face communication but unfortunately reported that they did not have the appropriate resources/technology to support such a visit and instead preferred to proceed with a telephone visit.  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  12/13/2018-01/07/2019:  The sternum, pelvis and sacrum, T and C spine were treated to 37.5 Gy in 15 fractions   12/19/19 SRS Treatment: The T1 vertebral body was treated with stereotactic radiosurgery [facility]) to 18 Gy in 1 fraction  08/22/2018-09/05/2018:  30 Gy in 10 fractions to the right 11th rib  08/08/2018-08/21/2018:  30 Gy in 10 fractions to the right elbow  05/08/2017 - 06/21/2017: The patient initially received a dose of 50.4 Gy in 28 fractions to the leftchest wall,supraclavicular region, and posterior axillary region. This was delivered using a 3-D conformal, 4 field technique. The patient then received a boost to the mastectomy scar. This delivered an additional 10 Gy in 5 fractions using an en face electron field. The total dose was 60.4  Gy.  Narrative:  Laura Anthony is a pleasant 44 y.o. female with a history of recurrent metastatic breast cancer involving the lungs, bone, and brain.  She is well-known to our service and has had multiple courses of radiotherapy with her most recent being single site SRS in the right frontal lobe.  She continues systemic therapy under the care of Dr. Lindi Adie.  Her current therapy includes Trodelvy. Her most recent MRI of the thoracic spine and brain were performed yesterday.  Fortunately her MRI of the brain shows a decreased size in the precentral gyrus lesion following her treatment, findings were also noted in the C3 vertebral body though this was already treated.  No new metastatic lesions were identified.  In the thoracic spine signal changes from T10-L1 were consistent with prior radiotherapy and no residual lesion in T12.  And T8 there was diminished contrast-enhancement related to treatment effect, and no new metastatic disease was identified.  She is contacted today to review these results.   On review of systems, the patient reports that she is doing well for the most part. She has mid back and shoulder pain a day or so after chemotherapy. This has been pretty stable but she will keep track of any changes in her symptoms.   She does have significant fatigue and recently started taking phenergan due to increased nausea. No other complaints are noted.   PAST MEDICAL HISTORY:  Past Medical History:  Diagnosis Date  . ADD (attention deficit disorder)   . Anemia   . Anxiety   . Breast cancer, left breast ([place])    S/P mastectomy 03/27/2017  .  DVT (deep venous thrombosis) ([place]) 2017   calf left - probably due to [facility] pills-took eliquis x3 mos, nonthing now  . High cholesterol   . Impingement syndrome of right shoulder 07/2013  . Migraine    "usually 1/month" (03/28/2017)  . PONV (postoperative nausea and vomiting)   . Right bicipital tenosynovitis 07/2013  . Rotator cuff impingement  syndrome of right shoulder 07/12/2013  . Seizures ([place])    x 1 as a child - was never on anticonvulsants (03/28/2017)    PAST SURGICAL HISTORY: Past Surgical History:  Procedure Laterality Date  . ADENOIDECTOMY  1981  . ANKLE ARTHROSCOPY Right   . BREAST BIOPSY Left 10/2016  . KNEE ARTHROSCOPY Right   . KNEE ARTHROSCOPY W/ ACL RECONSTRUCTION Left   . LIPOSUCTION WITH LIPOFILLING Bilateral 01/05/2018   Procedure: LIPOFILLING FROM ABDOMEN TO BILATERAL CHEST;  Surgeon: Irene Limbo, MD;  Location: [place];  Service: Plastics;  Laterality: Bilateral;  . MASTECTOMY Left 03/27/2017   NIPPLE SPARING MASTECTOMY WITH RADIOACTIVE SEED TARGETED LYMPH NODE EXCISION AND LEFT AXILLARY SENTINEL LYMPH NODE BIOPSY  . MASTECTOMY Right 03/27/2017   RIGHT PROPHYLACTIC NIPPLE SPARING MASTECTOMY  . NIPPLE SPARING MASTECTOMY Right 03/27/2017   Procedure: RIGHT PROPHYLACTIC NIPPLE SPARING MASTECTOMY;  Surgeon: Rolm Bookbinder, MD;  Location: [place];  Service: General;  Laterality: Right;  . PORT-A-CATH REMOVAL Right 01/05/2018   Procedure: REMOVAL RIGHT CHEST PORT;  Surgeon: Irene Limbo, MD;  Location: [place];  Service: Plastics;  Laterality: Right;  . PORTACATH PLACEMENT N/A 11/01/2016   Procedure: INSERTION PORT-A-CATH WITH [place];  Surgeon: Rolm Bookbinder, MD;  Location: [place];  Service: General;  Laterality: N/A;  . PORTACATH PLACEMENT N/A 08/23/2018   Procedure: INSERTION PORT-A-CATH WITH ULTRASOUND;  Surgeon: Rolm Bookbinder, MD;  Location: [place];  Service: General;  Laterality: N/A;  . RADIOACTIVE SEED GUIDED AXILLARY SENTINEL LYMPH NODE Left 03/27/2017   Procedure: LEFT NIPPLE SPARING MASTECTOMY WITH RADIOACTIVE SEED TARGETED LYMPH NODE EXCISION AND LEFT AXILLARY SENTINEL LYMPH NODE BIOPSY;  Surgeon: Rolm Bookbinder, MD;  Location: [place];  Service: General;  Laterality: Left;  REQUESTS RNFA  .  RECONSTRUCTION BREAST IMMEDIATE / DELAYED W/ TISSUE EXPANDER Bilateral 03/27/2017   BILATERAL BREAST RECONSTRUCTION WITH PLACEMENT OF TISSUE EXPANDER AND ALLODERM  . REMOVAL OF BILATERAL TISSUE EXPANDERS WITH PLACEMENT OF BILATERAL BREAST IMPLANTS Bilateral 01/05/2018   Procedure: REMOVAL OF BILATERAL TISSUE EXPANDERS WITH PLACEMENT OF BILATERAL BREAST IMPLANTS;  Surgeon: Irene Limbo, MD;  Location: [place];  Service: Plastics;  Laterality: Bilateral;  . SEPTOPLASTY WITH ETHMOIDECTOMY, AND MAXILLARY ANTROSTOMY  10/29/2010   bilat. max. antrostomy with left max. stripping; left ant. ethmoidectomy; right total ethmoidectomy; sphenoidotomy  . SHOULDER ARTHROSCOPY WITH SUBACROMIAL DECOMPRESSION AND BICEP TENDON REPAIR Right 07/12/2013   Procedure: RIGHT SHOULDER ARTHROSCOPY DEBRIDEMENT EXTENSIVE DECOMPRESSION SUBACROMIAL PARTIAL ACROMIOPLASTY;  Surgeon: Johnny Bridge, MD;  Location: [place];  Service: Orthopedics;  Laterality: Right;  . WRIST ARTHROSCOPY  01/17/2012   Procedure: ARTHROSCOPY WRIST; right wrist Surgeon: Tennis Must, MD;  Location: [place];  Service: Orthopedics;  Laterality: Right;  RIGHT WRIST ARTHROSCOPY WITH TRIANGULAR FIBROCARTILAGE COMPLEX REPAIR AND DEBRIDEMENT     PAST SOCIAL HISTORY:  Social History   Socioeconomic History  . Marital status: Married    Spouse name: Not on file  . Number of children: Not on file  . Years of education: Not on file  . Highest education  level: Not on file  Occupational History  . Not on file  Tobacco Use  . Smoking status: Never Smoker  . Smokeless tobacco: Never Used  Vaping Use  . Vaping Use: Never used  Substance and Sexual Activity  . Alcohol use: Yes    Comment: Drinks very rare  . Drug use: No  . Sexual activity: Yes    Birth control/protection: Surgical    Comment: husband has had a vasectomy  Other Topics Concern  . Not on file  Social History Narrative  . Not on  file   Social Determinants of Health   Financial Resource Strain:   . Difficulty of Paying Living Expenses: Not on file  Food Insecurity:   . Worried About Charity fundraiser in the Last Year: Not on file  . Ran Out of Food in the Last Year: Not on file  Transportation Needs:   . Lack of Transportation (Medical): Not on file  . Lack of Transportation (Non-Medical): Not on file  Physical Activity:   . Days of Exercise per Week: Not on file  . Minutes of Exercise per Session: Not on file  Stress:   . Feeling of Stress : Not on file  Social Connections:   . Frequency of Communication with Friends and Family: Not on file  . Frequency of Social Gatherings with Friends and Family: Not on file  . Attends Religious Services: Not on file  . Active Member of Clubs or Organizations: Not on file  . Attends Archivist Meetings: Not on file  . Marital Status: Not on file  Intimate Partner Violence:   . Fear of Current or Ex-Partner: Not on file  . Emotionally Abused: Not on file  . Physically Abused: Not on file  . Sexually Abused: Not on file  The patient is married and lives in [place]. She has teenage children.  PAST FAMILY HISTORY: Family History  Problem Relation Age of Onset  . Breast cancer Mother 51       triple negative  . Leukemia Father   . Lung cancer Father   . Heart attack Maternal Uncle   . Prostate cancer Paternal Uncle   . COPD Paternal Grandmother   . Heart disease Paternal Grandfather   . Prostate cancer Paternal Uncle   . Leukemia Cousin     MEDICATIONS  Current Outpatient Medications  Medication Sig Dispense Refill  . ELIQUIS 5 MG TABS tablet Take 1 tablet (5 mg total) by mouth 2 (two) times daily. 60 tablet 3  . gabapentin (NEURONTIN) 300 MG capsule Take 1 tablet by mouth in morning and afternoon, take 2 tablets by mouth at bedtime (Patient taking differently: Take 300-600 mg by mouth in the morning, at noon, and at bedtime. Take 1 tablet by  mouth in morning and afternoon, take 2 tablets by mouth at bedtime) 360 capsule 3  . levothyroxine (SYNTHROID) 88 MCG tablet TAKE 1 TABLET (88 MCG TOTAL) BY MOUTH DAILY BEFORE BREAKFAST. 90 tablet 0  . loperamide (IMODIUM A-D) 2 MG tablet Take 2 at diarrhea onset, then 1 every 2hr until 12hrs with no BM. May take 2 every 4hrs at night. If diarrhea recurs repeat. (Patient taking differently: Take 2 mg by mouth 4 (four) times daily as needed for diarrhea or loose stools. Take 2 at diarrhea onset, then 1 every 2hr until 12hrs with no BM. May take 2 every 4hrs at night. If diarrhea recurs repeat.) 100 tablet 1  . LORazepam (ATIVAN) 0.5 MG  tablet Take 1 tablet (0.5 mg total) by mouth every 6 (six) hours as needed for anxiety. 45 tablet 1  . methadone (DOLOPHINE) 10 MG tablet Take 1 tablet (10 mg total) by mouth every 8 (eight) hours. 90 tablet 0  . naloxone (NARCAN) 4 MG/0.1ML LIQD nasal spray kit Spray once in one nostril prn overdose, may repeat x 1 1 each 1  . ondansetron (ZOFRAN) 8 MG tablet Take 1 tablet (8 mg total) by mouth 2 (two) times daily as needed. Start on the third day after chemotherapy. (Patient taking differently: Take 8 mg by mouth 2 (two) times daily as needed for nausea or vomiting. Start on the third day after chemotherapy.) 30 tablet 1  . oxyCODONE-acetaminophen (PERCOCET) 10-325 MG tablet Take 1 tablet by mouth every 4 (four) hours as needed for pain. 120 tablet 0  . pantoprazole (PROTONIX) 40 MG tablet TAKE 1 TABLET BY MOUTH TWICE A DAY 180 tablet 1  . prochlorperazine (COMPAZINE) 10 MG tablet Take 1 tablet (10 mg total) by mouth every 6 (six) hours as needed (Nausea or vomiting). 30 tablet 1  . promethazine (PHENERGAN) 25 MG tablet Take 1 tablet (25 mg total) by mouth every 6 (six) hours as needed for nausea. 60 tablet 3  . venlafaxine XR (EFFEXOR-XR) 150 MG 24 hr capsule TAKE 1 CAPSULE (150 MG TOTAL) BY MOUTH DAILY WITH BREAKFAST. 90 capsule 3  . zolpidem (AMBIEN CR) 12.5 MG CR  tablet Take 1 tablet (12.5 mg total) by mouth at bedtime as needed for sleep. (Patient taking differently: Take 12.5 mg by mouth at bedtime. ) 30 tablet 3  . loratadine (CLARITIN) 10 MG tablet Take 10 mg by mouth at bedtime.     No current facility-administered medications for this encounter.    ALLERGIES:  Allergies  Allergen Reactions  . Denosumab Other (See Comments)    Osteonecrosis of the jaw  . Statins Other (See Comments)    Leg pain  . Sumatriptan Other (See Comments)    Numbness to face   . Tape Rash    Rash from dressing over port-a-cath    Physcial Exam: Unable to assess due to encounter type.  Impression/Plan: 1. 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 Her scans were reviewed by Dr. [name] and he is pleased. No new disease is noted, and the sites that were commented on by radiology have all been previous treatment sites. We will plan for repeat scan in 3 months, she is in agreement with this plan. She will otherwise continue with Dr. Lindi Adie for systemic therapy.  Given current concerns for patient exposure during the COVID-19 pandemic, this encounter was conducted via telephone.  The patient has provided two factor identification and has given verbal consent for this type of encounter and has been advised to only accept a meeting of this type in a secure network environment. The time spent during this encounter was 20 minutes including preparation, discussion, and coordination of the patient's care. The attendants for this meeting include Hayden Pedro  and Hilda Lias Setzler.  During the encounter Hayden Pedro was located at [facility] Radiation Oncology Department.  Laura Anthony was located at home.      Carola Rhine, PAC

## 2019-10-02 NOTE — Progress Notes (Signed)
Patient Care Team: [name], MD as PCP - General (Family Medicine) Mauro Kaufmann, RN as Oncology Nurse Navigator Carlynn Spry, Charlott Holler, RN as Oncology Nurse Navigator [place]pe, Aletha Halim, NP as Nurse Practitioner (Hospice and Palliative Medicine) Nicholas Lose, MD as Consulting Physician (Hematology and Oncology) Delice Bison Charlestine Massed, NP as Nurse Practitioner (Hematology and Oncology)  DIAGNOSIS:    ICD-10-CM   1. Metastatic breast cancer ([place])  C50.919 CT CHEST ABDOMEN PELVIS W CONTRAST  2. Malignant neoplasm of lower-outer quadrant of left breast of female, estrogen receptor negative ([place])  C50.512    Z17.1   3. Hypothyroidism, unspecified type  E03.9 TSH    SUMMARY OF ONCOLOGIC HISTORY: 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, estrogen receptor negative ([place])  10/20/2016 Mammogram   Mammogram and ultrasound of the left breast revealed 1.7 cm mass at 4:00 position, 6:30 position 5 x 4 x 4 mm mass, 6:00 position 5 cm nipple 7 x 6 x 11 mm, left axillary lymph node with thickened cortex, T1c N1 stage II a AJCC 8   10/24/2016 Initial Diagnosis   Left breast biopsy 6:30 position 3 cm from nipple: IDC grade 2, DCIS, ER 0%, PR 0%, Ki-67 15% HER-2 positive ratio 2.1; 4:00 position 3 cm from nipple: IDC grade 2, DCIS, ER 0%, PR 0%, Ki-67 35%, HER-2 positive ratio 2.02; left axillary lymph node biopsy positive   11/04/2016 - 02/17/2017 Neo-Adjuvant Chemotherapy   TCH Perjeta 6 cycles followed by Herceptin + Perjeta maintenance to be completed September 2019   11/30/2016 Genetic Testing   Negative genetic testing on the common hereditary cancer panel.  The Hereditary Gene Panel offered by [facility] includes sequencing and/or deletion duplication testing of the following 47 genes: APC, ATM, AXIN2, BARD1, BMPR1A, BRCA1, BRCA2, BRIP1, CDH1, CDK4, CDKN2A (p14ARF), CDKN2A (p16INK4a), CHEK2, CTNNA1, DICER1, EPCAM (Deletion/duplication testing only),  GREM1 (promoter region deletion/duplication testing only), KIT, MEN1, MLH1, MSH2, MSH3, MSH6, MUTYH, NBN, NF1, NHTL1, PALB2, PDGFRA, PMS2, POLD1, POLE, PTEN, RAD50, RAD51C, RAD51D, SDHB, SDHC, SDHD, SMAD4, SMARCA4. STK11, TP53, TSC1, TSC2, and VHL.  The following genes were evaluated for sequence changes only: SDHA and HOXB13 c.251G>A variant only. The report date is November 30, 2016.    03/27/2017 Surgery   Bilateral mastectomies: Left mastectomy: IDC grade 2 0.9 cm, nodes negative, right mastectomy benign, ER 0%, PR 0%, HER-2 positive ratio 2.6   05/08/2017 - 06/09/2017 Radiation Therapy   Adjuvant radiation therapy   10/23/2017 Miscellaneous   Neratinib discontinued after 4 weeks for severe diarrhea   07/25/2018 Relapse/Recurrence   MRI of right elbow showed bone lesion consistent with malignancy. PET scan showed bilateral pulmonary nodules and several lytic bone lesions compatible with metastatic disease. Brain MRI on 08/02/18 showed no evidence of metastatic disease.   08/02/2018 PET scan   Bilateral hypermetabolic lung nodules, LUL 1.3 cm with SUV 3.88, lingular nodule 1.4 cm SUV 3.7, central lingular nodule 1.2 cm SUV 9.76, right middle lobe nodule 1.5 cm SUV 9.9, lytic bone metastases inferior pubic ramus, sacrum, T12, right 11th rib.   08/08/2018 Procedure   Lung biopsy: metastatic carcinoma, HER-2 negative (0), ER/PR negative.   08/10/2018 -  Radiation Therapy   Palliative radiatio to the right humerus along the medial condyle   08/24/2018 - 09/05/2018 Radiation Therapy   Palliative radiation to the right 11th rib and right elbow   09/26/2018 - 12/04/2018 Chemotherapy   Carboplatin atezolizumab at [facility] with Dr. Lattie Haw  Hiram Comber on [facility] clinical trial stopped because of new T5 metastases (toxicities included myopathy required prednisone, immune mediated thyroiditis), right upper extremity DVT on apixaban   12/14/2018 - 04/10/2019 Chemotherapy   The patient had pegfilgrastim-bmez  (ZIEXTENZO) injection 6 mg, 6 mg, Subcutaneous,  Once, 2 of 2 cycles Administration: 6 mg (03/02/2019), 6 mg (02/16/2019), 6 mg (03/16/2019), 6 mg (03/29/2019) eriBULin mesylate (HALAVEN) 2.7 mg in sodium chloride 0.9 % 100 mL chemo infusion, 1.42 mg/m2 = 2.65 mg, Intravenous,  Once, 4 of 4 cycles Dose modification: 1.1 mg/m2 (original dose 1.4 mg/m2, Cycle 2, Reason: Dose not tolerated, Comment: neutropenic fever), 0.7 mg/m2 (original dose 1.4 mg/m2, Cycle 4, Reason: Provider Judgment), 0.7 mg/m2 (original dose 1.4 mg/m2, Cycle 4, Reason: Provider Judgment) Administration: 2.7 mg (12/14/2018), 2.7 mg (12/21/2018), 2 mg (01/03/2019), 2 mg (01/10/2019), 1.35 mg (02/15/2019), 1.35 mg (03/01/2019), 1.35 mg (03/15/2019), 1.35 mg (03/28/2019)  for chemotherapy treatment.    12/17/2018 -  Radiation Therapy   Palliative radiation to sternal, sacral & pelvic lesions and SRS for T3 and C7-T1 lesions.   04/11/2019 -  Chemotherapy   The patient had dexamethasone (DECADRON) 4 MG tablet, 1 of 1 cycle, Start date: 04/01/2019, End date: 06/10/2019 palonosetron (ALOXI) injection 0.25 mg, 0.25 mg, Intravenous,  Once, 8 of 12 cycles Administration: 0.25 mg (04/11/2019), 0.25 mg (04/17/2019), 0.25 mg (05/02/2019), 0.25 mg (05/09/2019), 0.25 mg (05/23/2019), 0.25 mg (05/29/2019), 0.25 mg (06/13/2019), 0.25 mg (06/20/2019), 0.25 mg (07/05/2019), 0.25 mg (07/11/2019), 0.25 mg (08/15/2019), 0.25 mg (08/21/2019), 0.25 mg (09/05/2019), 0.25 mg (09/12/2019), 0.25 mg (09/26/2019) pegfilgrastim (NEULASTA) injection 6 mg, 6 mg, Subcutaneous, Once, 1 of 1 cycle Administration: 6 mg (04/18/2019) pegfilgrastim (NEULASTA ONPRO KIT) injection 6 mg, 6 mg, Subcutaneous, Once, 7 of 11 cycles Administration: 6 mg (05/29/2019), 6 mg (05/09/2019), 6 mg (06/20/2019), 6 mg (07/11/2019), 6 mg (08/21/2019), 6 mg (09/12/2019) fosaprepitant (EMEND) 150 mg in sodium chloride 0.9 % 145 mL IVPB, 150 mg, Intravenous,  Once, 8 of 12 cycles Administration: 150 mg (04/11/2019), 150 mg  (04/17/2019), 150 mg (05/02/2019), 150 mg (05/09/2019), 150 mg (05/23/2019), 150 mg (05/29/2019), 150 mg (06/13/2019), 150 mg (06/20/2019), 150 mg (07/05/2019), 150 mg (07/11/2019), 150 mg (08/15/2019), 150 mg (08/21/2019), 150 mg (09/05/2019), 150 mg (09/12/2019), 150 mg (09/26/2019) sacituzumab govitecan-hziy (TRODELVY) 590 mg in sodium chloride 0.9 % 250 mL (1.9094 mg/mL) chemo infusion, 8 mg/kg = 590 mg (100 % of original dose 8 mg/kg), Intravenous,  Once, 8 of 12 cycles Dose modification: 8 mg/kg (original dose 8 mg/kg, Cycle 1, Reason: Provider Judgment), 4 mg/kg (original dose 4 mg/kg, Cycle 2, Reason: Other (see comments), Comment: split bags), 3 mg/kg (original dose 4 mg/kg, Cycle 2, Reason: Provider Judgment), 4 mg/kg (original dose 4 mg/kg, Cycle 2, Reason: Other (see comments), Comment: split bags) Administration: 590 mg (04/11/2019), 590 mg (04/17/2019), 590 mg (05/02/2019), 220 mg (05/09/2019), 220 mg (05/23/2019), 220 mg (05/29/2019), 220 mg (06/13/2019), 220 mg (06/20/2019), 220 mg (05/09/2019), 220 mg (05/23/2019), 220 mg (05/29/2019), 220 mg (06/13/2019), 220 mg (06/20/2019), 220 mg (07/05/2019), 220 mg (07/05/2019), 220 mg (07/11/2019), 220 mg (07/11/2019), 220 mg (08/15/2019), 220 mg (08/15/2019), 220 mg (08/21/2019), 220 mg (08/21/2019), 220 mg (09/05/2019), 220 mg (09/05/2019), 220 mg (09/12/2019), 220 mg (09/12/2019), 220 mg (09/26/2019), 220 mg (09/26/2019)  for chemotherapy treatment.    Bone metastases ([place])  08/07/2018 Initial Diagnosis   Bone metastases ([place])   12/14/2018 - 04/24/2019 Chemotherapy   The patient had pertuzumab (PERJETA) 420 mg in sodium chloride 0.9 % 250  mL chemo infusion, 420 mg (100 % of original dose 420 mg), Intravenous, Once, 5 of 6 cycles Dose modification: 420 mg (original dose 420 mg, Cycle 1, Reason: Provider Judgment) Administration: 420 mg (12/14/2018), 420 mg (01/10/2019), 420 mg (03/28/2019), 420 mg (01/30/2019), 420 mg (03/01/2019) trastuzumab-dkst (OGIVRI) 600 mg in sodium chloride 0.9 % 250 mL  chemo infusion, 609 mg, Intravenous,  Once, 5 of 6 cycles Administration: 600 mg (12/14/2018), 450 mg (01/10/2019), 450 mg (03/28/2019), 450 mg (01/30/2019), 450 mg (03/01/2019)  for chemotherapy treatment.    12/17/2018 -  Radiation Therapy   Palliative radiation to sternal, sacral & pelvic lesions and SRS for T3 and C7-T1 lesions.     CHIEF COMPLIANT: Follow-up of metastatic breast cancer,cycle8day8Trodelvy  INTERVAL HISTORY: Laura Anthony is a 44 y.o. with above-mentioned history of metastatic breast cancer who is currently ontreatment withTrodelvy.She presents to the clinic todayfor treatment.  Her biggest issues are related to fatigue that starts a day or 2 after treatment.  Last for 48 hours.  She also feels nauseated for those 2 days.  She has been eating the whole lot of food because she has no appetite and nothing tastes good.  Her pain is under good control.  She requests a refill of Ambien CR.  ALLERGIES:  is allergic to denosumab, statins, sumatriptan, and tape.  MEDICATIONS:  Current Outpatient Medications  Medication Sig Dispense Refill  . ELIQUIS 5 MG TABS tablet Take 1 tablet (5 mg total) by mouth 2 (two) times daily. 60 tablet 3  . gabapentin (NEURONTIN) 300 MG capsule Take 1 tablet by mouth in morning and afternoon, take 2 tablets by mouth at bedtime (Patient taking differently: Take 300-600 mg by mouth in the morning, at noon, and at bedtime. Take 1 tablet by mouth in morning and afternoon, take 2 tablets by mouth at bedtime) 360 capsule 3  . levothyroxine (SYNTHROID) 88 MCG tablet TAKE 1 TABLET (88 MCG TOTAL) BY MOUTH DAILY BEFORE BREAKFAST. 90 tablet 0  . loperamide (IMODIUM A-D) 2 MG tablet Take 2 at diarrhea onset, then 1 every 2hr until 12hrs with no BM. May take 2 every 4hrs at night. If diarrhea recurs repeat. (Patient taking differently: Take 2 mg by mouth 4 (four) times daily as needed for diarrhea or loose stools. Take 2 at diarrhea onset, then 1  every 2hr until 12hrs with no BM. May take 2 every 4hrs at night. If diarrhea recurs repeat.) 100 tablet 1  . loratadine (CLARITIN) 10 MG tablet Take 10 mg by mouth at bedtime.    Marland Kitchen LORazepam (ATIVAN) 0.5 MG tablet Take 1 tablet (0.5 mg total) by mouth every 6 (six) hours as needed for anxiety. 45 tablet 1  . methadone (DOLOPHINE) 10 MG tablet Take 1 tablet (10 mg total) by mouth every 8 (eight) hours. 90 tablet 0  . naloxone (NARCAN) 4 MG/0.1ML LIQD nasal spray kit Spray once in one nostril prn overdose, may repeat x 1 1 each 1  . ondansetron (ZOFRAN) 8 MG tablet Take 1 tablet (8 mg total) by mouth 2 (two) times daily as needed. Start on the third day after chemotherapy. (Patient taking differently: Take 8 mg by mouth 2 (two) times daily as needed for nausea or vomiting. Start on the third day after chemotherapy.) 30 tablet 1  . oxyCODONE-acetaminophen (PERCOCET) 10-325 MG tablet Take 1 tablet by mouth every 4 (four) hours as needed for pain. 120 tablet 0  . pantoprazole (PROTONIX) 40 MG tablet TAKE 1 TABLET BY  MOUTH TWICE A DAY 180 tablet 1  . prochlorperazine (COMPAZINE) 10 MG tablet Take 1 tablet (10 mg total) by mouth every 6 (six) hours as needed (Nausea or vomiting). 30 tablet 1  . venlafaxine XR (EFFEXOR-XR) 150 MG 24 hr capsule TAKE 1 CAPSULE (150 MG TOTAL) BY MOUTH DAILY WITH BREAKFAST. 90 capsule 3  . zolpidem (AMBIEN CR) 12.5 MG CR tablet Take 1 tablet (12.5 mg total) by mouth at bedtime. 30 tablet 3   No current facility-administered medications for this visit.    PHYSICAL EXAMINATION: ECOG PERFORMANCE STATUS: 1 - Symptomatic but completely ambulatory  Vitals:   10/03/19 0812  BP: 111/76  Pulse: 84  Resp: 18  Temp: 97.9 F (36.6 C)  SpO2: 100%   Filed Weights   10/03/19 0812  Weight: 165 lb 11.2 oz (75.2 kg)    LABORATORY DATA:  I have reviewed the data as listed CMP Latest Ref Rng & Units 09/26/2019 09/12/2019 09/05/2019  Glucose 70 - 99 mg/dL 107(H) 102(H) 131(H)  BUN 6  - 20 mg/dL _0 Creatinine 0.44 - 1.00 mg/dL 0.82 0.79 0.80  Sodium 135 - 145 mmol/L 139 139 139  Potassium 3.5 - 5.1 mmol/L 4.0 3.6 3.7  Chloride 98 - 111 mmol/L 102 105 104  CO2 22 - 32 mmol/L _1 Calcium 8.9 - 10.3 mg/dL 10.2 9.9 10.0  Total Protein 6.5 - 8.1 g/dL 7.0 6.7 7.1  Total Bilirubin 0.3 - 1.2 mg/dL 0.5 0.3 0.4  Alkaline Phos 38 - 126 U/L 136(H) 110 130(H)  AST 15 - 41 U/L 13(L) 17 22  ALT 0 - 44 U/L _2 Lab Results  Component Value Date   WBC 2.7 (L) 10/03/2019   HGB 10.3 (L) 10/03/2019   HCT 31.7 (L) 10/03/2019   MCV 97.2 10/03/2019   PLT 204 10/03/2019   NEUTROABS 1.3 (L) 10/03/2019    ASSESSMENT & PLAN:  Malignant neoplasm of lower-outer quadrant of left breast of female, estrogen receptor negative ([place]) 10/24/2016: Left breast biopsy 6:30 position 3 cm from nipple: IDC grade 2, DCIS, ER 0%, PR 0%, Ki-67 15% HER-2 positive ratio 2.1; 4:00 position 3 cm from nipple: IDC grade 2, DCIS, ER 0%, PR 0%, Ki-67 35%, HER-2 positive ratio 2.02 Lymph node biopsy positive  Treatment Summary: 1. Neoadjuvant chemotherapy with TCHPcompleted 02/17/2017 this would be followed by Herceptinand Perjetamaintenance for 1 yearcompleted September 2019 2.Bilateral mastectomies 03/28/2016:Bilateral mastectomies: Left mastectomy: IDC grade 2 0.9 cm, nodes negative, right mastectomy benign, ER 0%, PR 0%, HER-2 positive ratio 2.6 3.Adjuvant radiation4/08/2017 to 06/09/2017 4.Neratinib started 10/12/2017 discontinued due to diarrhea 5. Elbow fracture: Due to metastatic disease, palliative radiation therapy 6. Carboplatin atezolizumab at [facility] on a clinical trial [facility] 043 stopped for progression 12/04/2018 ---------------------------------------------------------------------------------------------------------------- Lung nodule biopsy: Metastatic breast cancer triple negative Current treatment: Sacituzumab-Govitecan days 1 and 8 every 3 weeks with Neulasta on  day 9 started 04/11/2019, today is cycle8day 8  Toxicities: 1.Fatigue: Manageable 2. mild nausea 3.Thrombocytopenia:Platelet count 204 4.Anemia: Stablehemoglobin is 10.3 5.  Decreased appetite making it difficult to eat.  Bone scan 08/15/2019: Improvement in bone metastases. Stable  COVID-19 hospitalization 07/22/2019-07/25/2019: Covid pneumonia treated with remdesivir  Pain regimen: On methadone and Percocets  She has not required Percocets at all. I refilled her methadone today.  Osteonecrosis of jaw: Discontinued Xgeva.  Continue with Sacituzumab-Govitecan. We will plan for CT chest abdomen pelvis in 2 weeks and follow-up with her next cycle  of treatment.    Orders Placed This Encounter  Procedures  . CT CHEST ABDOMEN PELVIS W CONTRAST    Standing Status:   Future    Standing Expiration Date:   10/02/2020    Order Specific Question:   Reason for Exam (SYMPTOM  OR DIAGNOSIS REQUIRED)    Answer:   Metastatic breast cancer restaging on chemo    Order Specific Question:   Is patient pregnant?    Answer:   No    Order Specific Question:   Preferred imaging location?    Answer:   [facility]    Order Specific Question:   Release to patient    Answer:   Immediate    Order Specific Question:   Radiology Contrast Protocol - do NOT remove file path    Answer:   [email or web address]  . TSH    Standing Status:   Future    Standing Expiration Date:   10/02/2020   The patient has a good understanding of the overall plan. she agrees with it. she will call with any problems that may develop before the next visit here.  Total time spent: 30 mins including face to face time and time spent for planning, charting and coordination of care  Nicholas Lose, MD 10/03/2019  I, Cloyde Reams Dorshimer, am acting as scribe for Dr. Nicholas Lose.  I have reviewed the above documentation for accuracy and completeness, and I agree with the  above.

## 2019-10-03 ENCOUNTER — Inpatient Hospital Stay (HOSPITAL_BASED_OUTPATIENT_CLINIC_OR_DEPARTMENT_OTHER): Payer: 59 | Admitting: Hematology and Oncology

## 2019-10-03 ENCOUNTER — Other Ambulatory Visit: Payer: Self-pay | Admitting: Hematology and Oncology

## 2019-10-03 ENCOUNTER — Other Ambulatory Visit: Payer: Self-pay

## 2019-10-03 ENCOUNTER — Inpatient Hospital Stay: Payer: 59

## 2019-10-03 ENCOUNTER — Encounter: Payer: Self-pay | Admitting: *Deleted

## 2019-10-03 ENCOUNTER — Inpatient Hospital Stay: Payer: 59 | Attending: Hematology and Oncology

## 2019-10-03 VITALS — BP 111/76 | HR 84 | Temp 97.9°F | Resp 18 | Ht 67.0 in | Wt 165.7 lb

## 2019-10-03 DIAGNOSIS — Z171 Estrogen receptor negative status [ER-]: Secondary | ICD-10-CM

## 2019-10-03 DIAGNOSIS — Z7189 Other specified counseling: Secondary | ICD-10-CM

## 2019-10-03 DIAGNOSIS — C50919 Malignant neoplasm of unspecified site of unspecified female breast: Secondary | ICD-10-CM | POA: Diagnosis not present

## 2019-10-03 DIAGNOSIS — E039 Hypothyroidism, unspecified: Secondary | ICD-10-CM

## 2019-10-03 DIAGNOSIS — G893 Neoplasm related pain (acute) (chronic): Secondary | ICD-10-CM

## 2019-10-03 DIAGNOSIS — Z923 Personal history of irradiation: Secondary | ICD-10-CM | POA: Diagnosis not present

## 2019-10-03 DIAGNOSIS — Z5112 Encounter for antineoplastic immunotherapy: Secondary | ICD-10-CM | POA: Insufficient documentation

## 2019-10-03 DIAGNOSIS — Z9221 Personal history of antineoplastic chemotherapy: Secondary | ICD-10-CM | POA: Insufficient documentation

## 2019-10-03 DIAGNOSIS — C50512 Malignant neoplasm of lower-outer quadrant of left female breast: Secondary | ICD-10-CM | POA: Insufficient documentation

## 2019-10-03 DIAGNOSIS — Z9013 Acquired absence of bilateral breasts and nipples: Secondary | ICD-10-CM | POA: Insufficient documentation

## 2019-10-03 DIAGNOSIS — Z79899 Other long term (current) drug therapy: Secondary | ICD-10-CM | POA: Diagnosis not present

## 2019-10-03 DIAGNOSIS — C7951 Secondary malignant neoplasm of bone: Secondary | ICD-10-CM

## 2019-10-03 DIAGNOSIS — Z95828 Presence of other vascular implants and grafts: Secondary | ICD-10-CM

## 2019-10-03 LAB — CMP (CANCER CENTER ONLY)
ALT: 13 U/L (ref 0–44)
AST: 19 U/L (ref 15–41)
Albumin: 3.1 g/dL — ABNORMAL LOW (ref 3.5–5.0)
Alkaline Phosphatase: 111 U/L (ref 38–126)
Anion gap: 10 (ref 5–15)
BUN: 9 mg/dL (ref 6–20)
CO2: 29 mmol/L (ref 22–32)
Calcium: 10.5 mg/dL — ABNORMAL HIGH (ref 8.9–10.3)
Chloride: 101 mmol/L (ref 98–111)
Creatinine: 0.77 mg/dL (ref 0.44–1.00)
GFR, Est AFR Am: >60 mL/min (ref >60)
GFR, Estimated: >60 mL/min (ref >60)
Glucose, Bld: 105 mg/dL — ABNORMAL HIGH (ref 70–99)
Potassium: 4 mmol/L (ref 3.5–5.1)
Sodium: 140 mmol/L (ref 135–145)
Total Bilirubin: 0.2 mg/dL — ABNORMAL LOW (ref 0.3–1.2)
Total Protein: 6.8 g/dL (ref 6.5–8.1)

## 2019-10-03 LAB — CBC WITH DIFFERENTIAL (CANCER CENTER ONLY)
Abs Immature Granulocytes: 0.01 10*3/uL (ref 0.00–0.07)
Basophils Absolute: 0 10*3/uL (ref 0.0–0.1)
Basophils Relative: 2 %
Eosinophils Absolute: 0.1 10*3/uL (ref 0.0–0.5)
Eosinophils Relative: 4 %
HCT: 31.7 % — ABNORMAL LOW (ref 36.0–46.0)
Hemoglobin: 10.3 g/dL — ABNORMAL LOW (ref 12.0–15.0)
Immature Granulocytes: 0 %
Lymphocytes Relative: 32 %
Lymphs Abs: 0.9 10*3/uL (ref 0.7–4.0)
MCH: 31.6 pg (ref 26.0–34.0)
MCHC: 32.5 g/dL (ref 30.0–36.0)
MCV: 97.2 fL (ref 80.0–100.0)
Monocytes Absolute: 0.4 10*3/uL (ref 0.1–1.0)
Monocytes Relative: 15 %
Neutro Abs: 1.3 10*3/uL — ABNORMAL LOW (ref 1.7–7.7)
Neutrophils Relative %: 47 %
Platelet Count: 204 10*3/uL (ref 150–400)
RBC: 3.26 MIL/uL — ABNORMAL LOW (ref 3.87–5.11)
RDW: 14.9 % (ref 11.5–15.5)
WBC Count: 2.7 10*3/uL — ABNORMAL LOW (ref 4.0–10.5)
nRBC: 0 % (ref 0.0–0.2)

## 2019-10-03 LAB — PHOSPHORUS: Phosphorus: 4.6 mg/dL (ref 2.5–4.6)

## 2019-10-03 LAB — MAGNESIUM: Magnesium: 1.8 mg/dL (ref 1.7–2.4)

## 2019-10-03 LAB — TSH: TSH: <0.08 u[IU]/mL — ABNORMAL LOW (ref 0.308–3.960)

## 2019-10-03 MED ORDER — DIPHENHYDRAMINE HCL 50 MG/ML IJ SOLN
INTRAMUSCULAR | Status: AC
  Filled 2019-10-03: qty 1

## 2019-10-03 MED ORDER — FAMOTIDINE IN NACL 20-0.9 MG/50ML-% IV SOLN
INTRAVENOUS | Status: AC
  Filled 2019-10-03: qty 50

## 2019-10-03 MED ORDER — SODIUM CHLORIDE 0.9% FLUSH
10.0000 mL | INTRAVENOUS | Status: DC | PRN
  Administered 2019-10-03: 10 mL
  Filled 2019-10-03: qty 10

## 2019-10-03 MED ORDER — SODIUM CHLORIDE 0.9 % IV SOLN
Freq: Once | INTRAVENOUS | Status: AC
  Filled 2019-10-03: qty 250

## 2019-10-03 MED ORDER — ZOLPIDEM TARTRATE ER 12.5 MG PO TBCR
12.5000 mg | EXTENDED_RELEASE_TABLET | Freq: Every day | ORAL | 3 refills | Status: DC
Start: 2019-10-03 — End: 2019-10-15

## 2019-10-03 MED ORDER — DEXAMETHASONE SODIUM PHOSPHATE 10 MG/ML IJ SOLN
5.0000 mg | Freq: Once | INTRAMUSCULAR | Status: AC
  Administered 2019-10-03: 5 mg via INTRAVENOUS

## 2019-10-03 MED ORDER — DEXAMETHASONE SODIUM PHOSPHATE 10 MG/ML IJ SOLN
INTRAMUSCULAR | Status: AC
  Filled 2019-10-03: qty 1

## 2019-10-03 MED ORDER — HEPARIN SOD (PORK) LOCK FLUSH 100 UNIT/ML IV SOLN
500.0000 [IU] | Freq: Once | INTRAVENOUS | Status: AC | PRN
  Administered 2019-10-03: 500 [IU]
  Filled 2019-10-03: qty 5

## 2019-10-03 MED ORDER — SODIUM CHLORIDE 0.9 % IV SOLN
3.0000 mg/kg | Freq: Once | INTRAVENOUS | Status: AC
  Administered 2019-10-03: 220 mg via INTRAVENOUS
  Filled 2019-10-03: qty 22

## 2019-10-03 MED ORDER — PALONOSETRON HCL INJECTION 0.25 MG/5ML
0.2500 mg | Freq: Once | INTRAVENOUS | Status: AC
  Administered 2019-10-03: 0.25 mg via INTRAVENOUS

## 2019-10-03 MED ORDER — SODIUM CHLORIDE 0.9 % IV SOLN
150.0000 mg | Freq: Once | INTRAVENOUS | Status: AC
  Administered 2019-10-03: 150 mg via INTRAVENOUS
  Filled 2019-10-03: qty 150

## 2019-10-03 MED ORDER — ACETAMINOPHEN 325 MG PO TABS
ORAL_TABLET | ORAL | Status: AC
  Filled 2019-10-03: qty 2

## 2019-10-03 MED ORDER — PEGFILGRASTIM 6 MG/0.6ML ~~LOC~~ PSKT
6.0000 mg | PREFILLED_SYRINGE | Freq: Once | SUBCUTANEOUS | Status: AC
  Administered 2019-10-03: 6 mg via SUBCUTANEOUS

## 2019-10-03 MED ORDER — DIPHENHYDRAMINE HCL 50 MG/ML IJ SOLN
25.0000 mg | Freq: Once | INTRAMUSCULAR | Status: AC
  Administered 2019-10-03: 25 mg via INTRAVENOUS

## 2019-10-03 MED ORDER — FAMOTIDINE IN NACL 20-0.9 MG/50ML-% IV SOLN
20.0000 mg | Freq: Once | INTRAVENOUS | Status: AC
  Administered 2019-10-03: 20 mg via INTRAVENOUS

## 2019-10-03 MED ORDER — ATROPINE SULFATE 1 MG/ML IJ SOLN: 0.5000 mg | Freq: Once | INTRAMUSCULAR | Status: DC | PRN

## 2019-10-03 MED ORDER — LEVOTHYROXINE SODIUM 112 MCG PO TABS: 112.0000 ug | ORAL_TABLET | Freq: Every day | ORAL | 3 refills | Status: DC

## 2019-10-03 MED ORDER — PEGFILGRASTIM 6 MG/0.6ML ~~LOC~~ PSKT
PREFILLED_SYRINGE | SUBCUTANEOUS | Status: AC
  Filled 2019-10-03: qty 0.6

## 2019-10-03 MED ORDER — PALONOSETRON HCL INJECTION 0.25 MG/5ML
INTRAVENOUS | Status: AC
  Filled 2019-10-03: qty 5

## 2019-10-03 MED ORDER — ACETAMINOPHEN 325 MG PO TABS
650.0000 mg | ORAL_TABLET | Freq: Once | ORAL | Status: AC
  Administered 2019-10-03: 650 mg via ORAL

## 2019-10-03 NOTE — Patient Instructions (Signed)
[facility] Discharge Instructions for Patients Receiving Chemotherapy  Today you received the following chemotherapy agents Trodelvy  To help prevent nausea and vomiting after your treatment, we encourage you to take your nausea medication as directed.    If you develop nausea and vomiting that is not controlled by your nausea medication, call the clinic.   BELOW ARE SYMPTOMS THAT SHOULD BE REPORTED IMMEDIATELY:  *FEVER GREATER THAN 100.5 F  *CHILLS WITH OR WITHOUT FEVER  NAUSEA AND VOMITING THAT IS NOT CONTROLLED WITH YOUR NAUSEA MEDICATION  *UNUSUAL SHORTNESS OF BREATH  *UNUSUAL BRUISING OR BLEEDING  TENDERNESS IN MOUTH AND THROAT WITH OR WITHOUT PRESENCE OF ULCERS  *URINARY PROBLEMS  *BOWEL PROBLEMS  UNUSUAL RASH Items with * indicate a potential emergency and should be followed up as soon as possible.  Feel free to call the clinic should you have any questions or concerns. The clinic phone number is [phone].  Please show the CHEMO ALERT CARD at check-in to the Emergency Department and triage nurse.   

## 2019-10-03 NOTE — Assessment & Plan Note (Signed)
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 Neoadjuvant chemotherapy with TCHPcompleted 02/17/2017 this would be followed by Herceptinand Perjetamaintenance for 1 yearcompleted September 2019 2.Bilateral mastectomies 03/28/2016:Bilateral mastectomies: Left mastectomy: IDC grade 2 0.9 cm, nodes negative, right mastectomy benign, ER 0%, PR 0%, HER-2 positive ratio 2.6 3.Adjuvant radiation4/08/2017 to 06/09/2017 4.Neratinib started 10/12/2017 discontinued due to diarrhea 5. Elbow fracture: Due to metastatic disease, palliative radiation therapy 6. Carboplatin atezolizumab at [facility] on a clinical trial [facility] 043 stopped for progression 12/04/2018 ---------------------------------------------------------------------------------------------------------------- Lung nodule biopsy: Metastatic breast cancer triple negative Current treatment: Sacituzumab-Govitecan days 1 and 8 every 3 weeks with Neulasta on day 9 started 04/11/2019, today is cycle7day 8  Toxicities: 1.Fatigue: Manageable 2. mild nausea 3.Thrombocytopenia:Platelet count 165 4.Anemia: Stablehemoglobin is 10.8  Bone scan 08/15/2019: Improvement in bone metastases. Stable  COVID-19 hospitalization 07/22/2019-07/25/2019: Covid pneumonia treated with remdesivir  Pain regimen: On methadone and Percocets  She has not required Percocets at all. I refilled her methadone today.  Osteonecrosis of jaw: Discontinued Xgeva.  Continue with Sacituzumab-Govitecan.

## 2019-10-03 NOTE — Progress Notes (Signed)
Per Dr. Lindi Adie, okay for patient to receive treatment today with [place] 1.3.

## 2019-10-03 NOTE — Progress Notes (Signed)
At her with her previous lab interpretation TSH being undetectable: We will discontinue Synthroid and recheck levels with next blood work

## 2019-10-03 NOTE — Progress Notes (Signed)
TSH less than 0.08 We will increase the Synthroid dose to 112 mcg daily

## 2019-10-04 ENCOUNTER — Telehealth: Payer: Self-pay | Admitting: Hematology and Oncology

## 2019-10-04 NOTE — Telephone Encounter (Signed)
Scheduled per 9/2 los. Called and spoke with pt, confirmed added appts

## 2019-10-10 ENCOUNTER — Encounter: Payer: Self-pay | Admitting: *Deleted

## 2019-10-15 ENCOUNTER — Inpatient Hospital Stay: Payer: 59

## 2019-10-15 ENCOUNTER — Telehealth: Payer: Self-pay | Admitting: *Deleted

## 2019-10-15 ENCOUNTER — Inpatient Hospital Stay (HOSPITAL_BASED_OUTPATIENT_CLINIC_OR_DEPARTMENT_OTHER): Payer: 59 | Admitting: Medical

## 2019-10-15 ENCOUNTER — Other Ambulatory Visit: Payer: Self-pay

## 2019-10-15 VITALS — BP 104/75 | HR 93 | Temp 98.5°F | Resp 17 | Ht 67.0 in | Wt 167.2 lb

## 2019-10-15 DIAGNOSIS — Z95828 Presence of other vascular implants and grafts: Secondary | ICD-10-CM

## 2019-10-15 DIAGNOSIS — Z7189 Other specified counseling: Secondary | ICD-10-CM

## 2019-10-15 DIAGNOSIS — M791 Myalgia, unspecified site: Secondary | ICD-10-CM

## 2019-10-15 DIAGNOSIS — C50512 Malignant neoplasm of lower-outer quadrant of left female breast: Secondary | ICD-10-CM

## 2019-10-15 DIAGNOSIS — Z171 Estrogen receptor negative status [ER-]: Secondary | ICD-10-CM

## 2019-10-15 DIAGNOSIS — F5101 Primary insomnia: Secondary | ICD-10-CM

## 2019-10-15 DIAGNOSIS — C7951 Secondary malignant neoplasm of bone: Secondary | ICD-10-CM

## 2019-10-15 DIAGNOSIS — M609 Myositis, unspecified: Secondary | ICD-10-CM

## 2019-10-15 LAB — CMP (CANCER CENTER ONLY)
ALT: 9 U/L (ref 0–44)
AST: 16 U/L (ref 15–41)
Albumin: 3.1 g/dL — ABNORMAL LOW (ref 3.5–5.0)
Alkaline Phosphatase: 121 U/L (ref 38–126)
Anion gap: 4 — ABNORMAL LOW (ref 5–15)
BUN: 7 mg/dL (ref 6–20)
CO2: 31 mmol/L (ref 22–32)
Calcium: 9.6 mg/dL (ref 8.9–10.3)
Chloride: 102 mmol/L (ref 98–111)
Creatinine: 0.86 mg/dL (ref 0.44–1.00)
GFR, Est AFR Am: >60 mL/min (ref >60)
GFR, Estimated: >60 mL/min (ref >60)
Glucose, Bld: 65 mg/dL — ABNORMAL LOW (ref 70–99)
Potassium: 4.6 mmol/L (ref 3.5–5.1)
Sodium: 137 mmol/L (ref 135–145)
Total Bilirubin: 0.3 mg/dL (ref 0.3–1.2)
Total Protein: 6.5 g/dL (ref 6.5–8.1)

## 2019-10-15 LAB — CBC WITH DIFFERENTIAL (CANCER CENTER ONLY)
Abs Immature Granulocytes: 0.04 10*3/uL (ref 0.00–0.07)
Basophils Absolute: 0 10*3/uL (ref 0.0–0.1)
Basophils Relative: 0 %
Eosinophils Absolute: 0.1 10*3/uL (ref 0.0–0.5)
Eosinophils Relative: 1 %
HCT: 32.4 % — ABNORMAL LOW (ref 36.0–46.0)
Hemoglobin: 10.3 g/dL — ABNORMAL LOW (ref 12.0–15.0)
Immature Granulocytes: 1 %
Lymphocytes Relative: 15 %
Lymphs Abs: 1.1 10*3/uL (ref 0.7–4.0)
MCH: 32 pg (ref 26.0–34.0)
MCHC: 31.8 g/dL (ref 30.0–36.0)
MCV: 100.6 fL — ABNORMAL HIGH (ref 80.0–100.0)
Monocytes Absolute: 0.7 10*3/uL (ref 0.1–1.0)
Monocytes Relative: 10 %
Neutro Abs: 5.1 10*3/uL (ref 1.7–7.7)
Neutrophils Relative %: 73 %
Platelet Count: 191 10*3/uL (ref 150–400)
RBC: 3.22 MIL/uL — ABNORMAL LOW (ref 3.87–5.11)
RDW: 15.8 % — ABNORMAL HIGH (ref 11.5–15.5)
WBC Count: 7 10*3/uL (ref 4.0–10.5)
nRBC: 0 % (ref 0.0–0.2)

## 2019-10-15 MED ORDER — ZOLPIDEM TARTRATE ER 12.5 MG PO TBCR: 12.5000 mg | EXTENDED_RELEASE_TABLET | Freq: Every day | ORAL | 3 refills | Status: DC

## 2019-10-15 MED ORDER — SODIUM CHLORIDE 0.9% FLUSH
10.0000 mL | INTRAVENOUS | Status: DC | PRN
  Filled 2019-10-15: qty 10

## 2019-10-15 MED ORDER — PREDNISONE 5 MG PO TABS: ORAL_TABLET | ORAL | 0 refills | Status: DC

## 2019-10-15 NOTE — Telephone Encounter (Signed)
Pt called with c/o feeling "achy" x 1w, "like I have bruises all over by body but none are there". Pt currently receiving Trodelvy.  Notified Dr Lindi Adie. Recommends pt seeing [facility].  [name] PA notified of Dr. Lindi Adie recommendation. Pt will be at [facility] at 2:30 for labs then to see [facility].

## 2019-10-15 NOTE — Progress Notes (Signed)
[facility] Progress Note   Laura Anthony [id number] 01/23/1976 44 y.o.  Laura Anthony is managed by Dr. Nicholas Anthony  Actively treated with chemotherapy/immunotherapy/hormonal therapy: yes  Current therapy: Sacituzumab-Govitecan days 1 and 8 every 3 weeks with Neulasta on day 9  Last treated: 10/03/2019 (cycle #8, day #8)  Next scheduled appointment with provider: 10/17/2019  Assessment: Plan:    Bone metastases ([place])  Malignant neoplasm of lower-outer quadrant of left breast of female, estrogen receptor negative ([place])  Port-A-Cath in place - Plan: sodium chloride flush (NS) 0.9 % injection 10 mL, DISCONTINUED: sodium chloride flush (NS) 0.9 % injection 10 mL  Myalgia - Plan: CANCELED: CK, total  Primary insomnia - Plan: zolpidem (AMBIEN CR) 12.5 MG CR tablet  Myositis of other site, unspecified myositis type - Plan: predniSONE (DELTASONE) 5 MG tablet   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 Nicholas Anthony and is currently treated with Sacituzumab-Govitecan days 1 and 8 every 3 weeks with Neulasta on day 9.  She is scheduled to be seen in follow-up on 10/17/2019.  Diffuse myalgias with suspected myositis: Initially a CK was going to be added to the patient's labs today however it was determined that she would have to have a repeat blood draw.  Based on this a CK was canceled.  She was given a low-dose prednisone taper with instructions to begin this tomorrow.  Primary insomnia: Patient was given a refill of Ambien CR 12.5 mg p.o. nightly as needed.  She was also instructed to begin melatonin 10 mg p.o. nightly in addition to Ambien as Ambien is not always effective.  Please see After Visit Summary for patient specific instructions.  Future Appointments  Date Time Provider [name]  10/22/2019  8:30 AM [facility]-CT 2 [facility]-CT [place]  10/24/2019  7:30 AM  [facility] LAB [facility] None  10/24/2019  7:45 AM [facility] INFUSION [facility] None  10/24/2019  8:15 AM Laura Lose, MD [facility] None  10/24/2019  9:00 AM [facility] INFUSION [facility] None  10/25/2019  3:45 PM [facility] [place] FLUSH [facility] None  11/07/2019  7:45 AM [facility] LAB [facility] None  11/07/2019  8:00 AM [facility] [place] FLUSH [facility] None  11/07/2019  9:00 AM [facility] INFUSION [facility] None  11/14/2019  7:30 AM [facility] LAB [facility] None  11/14/2019  7:45 AM [facility] INFUSION [facility] None  11/14/2019  8:45 AM Laura Lose, MD [facility] None  11/14/2019  9:00 AM [facility] INFUSION [facility] None  11/15/2019 10:45 AM [facility] [place] FLUSH [facility] None    No orders of the defined types were placed in this encounter.      Subjective:   Patient ID:  Laura Anthony is a 44 y.o. (DOB 05/18/1975) female.  Chief Complaint: No chief complaint on file.   HPI Laura Anthony  is a 44 y.o. 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 She continues to be managed by Dr. Nicholas Anthony and is currently treated with Sacituzumab-Govitecan days 1 and 8 every 3 weeks with Neulasta on day 9.  [name] called our office earlier today reporting that she was having diffuse upper and lower extremity body aches for the past week.  She reported that her pain was similar to the pain that one would have with bruising but that she had not noted any bruising at all.  She reports that her muscles hurt to  the touch.  She also reports that her skin in that area is tender. She is scheduled to be seen in follow-up on 10/17/2019.    Medications: I have reviewed the patient's current medications.  Allergies:  Allergies  Allergen Reactions  . Denosumab Other (See Comments)    Osteonecrosis of the jaw  . Statins Other (See Comments)    Leg pain  . Sumatriptan Other (See Comments)     Numbness to face   . Tape Rash    Rash from dressing over port-a-cath     Past Medical History:  Diagnosis Date  . ADD (attention deficit disorder)   . Anemia   . Anxiety   . Breast cancer, left breast ([place])    S/P mastectomy 03/27/2017  . DVT (deep venous thrombosis) ([place]) 2017   calf left - probably due to [facility] pills-took eliquis x3 mos, nonthing now  . High cholesterol   . Impingement syndrome of right shoulder 07/2013  . Migraine    "usually 1/month" (03/28/2017)  . PONV (postoperative nausea and vomiting)   . Right bicipital tenosynovitis 07/2013  . Rotator cuff impingement syndrome of right shoulder 07/12/2013  . Seizures ([place])    x 1 as a child - was never on anticonvulsants (03/28/2017)    Past Surgical History:  Procedure Laterality Date  . ADENOIDECTOMY  1981  . ANKLE ARTHROSCOPY Right   . BREAST BIOPSY Left 10/2016  . KNEE ARTHROSCOPY Right   . KNEE ARTHROSCOPY W/ ACL RECONSTRUCTION Left   . LIPOSUCTION WITH LIPOFILLING Bilateral 01/05/2018   Procedure: LIPOFILLING FROM ABDOMEN TO BILATERAL CHEST;  Surgeon: Irene Limbo, MD;  Location: [place];  Service: Plastics;  Laterality: Bilateral;  . MASTECTOMY Left 03/27/2017   NIPPLE SPARING MASTECTOMY WITH RADIOACTIVE SEED TARGETED LYMPH NODE EXCISION AND LEFT AXILLARY SENTINEL LYMPH NODE BIOPSY  . MASTECTOMY Right 03/27/2017   RIGHT PROPHYLACTIC NIPPLE SPARING MASTECTOMY  . NIPPLE SPARING MASTECTOMY Right 03/27/2017   Procedure: RIGHT PROPHYLACTIC NIPPLE SPARING MASTECTOMY;  Surgeon: Rolm Bookbinder, MD;  Location: [place];  Service: General;  Laterality: Right;  . PORT-A-CATH REMOVAL Right 01/05/2018   Procedure: REMOVAL RIGHT CHEST PORT;  Surgeon: Irene Limbo, MD;  Location: [place];  Service: Plastics;  Laterality: Right;  . PORTACATH PLACEMENT N/A 11/01/2016   Procedure: INSERTION PORT-A-CATH WITH [place];  Surgeon: Rolm Bookbinder, MD;  Location: [place];   Service: General;  Laterality: N/A;  . PORTACATH PLACEMENT N/A 08/23/2018   Procedure: INSERTION PORT-A-CATH WITH ULTRASOUND;  Surgeon: Rolm Bookbinder, MD;  Location: [place];  Service: General;  Laterality: N/A;  . RADIOACTIVE SEED GUIDED AXILLARY SENTINEL LYMPH NODE Left 03/27/2017   Procedure: LEFT NIPPLE SPARING MASTECTOMY WITH RADIOACTIVE SEED TARGETED LYMPH NODE EXCISION AND LEFT AXILLARY SENTINEL LYMPH NODE BIOPSY;  Surgeon: Rolm Bookbinder, MD;  Location: [place];  Service: General;  Laterality: Left;  REQUESTS RNFA  . RECONSTRUCTION BREAST IMMEDIATE / DELAYED W/ TISSUE EXPANDER Bilateral 03/27/2017   BILATERAL BREAST RECONSTRUCTION WITH PLACEMENT OF TISSUE EXPANDER AND ALLODERM  . REMOVAL OF BILATERAL TISSUE EXPANDERS WITH PLACEMENT OF BILATERAL BREAST IMPLANTS Bilateral 01/05/2018   Procedure: REMOVAL OF BILATERAL TISSUE EXPANDERS WITH PLACEMENT OF BILATERAL BREAST IMPLANTS;  Surgeon: Irene Limbo, MD;  Location: [place];  Service: Plastics;  Laterality: Bilateral;  . SEPTOPLASTY WITH ETHMOIDECTOMY, AND MAXILLARY ANTROSTOMY  10/29/2010   bilat. max. antrostomy with left max. stripping; left ant. ethmoidectomy; right total ethmoidectomy; sphenoidotomy  . SHOULDER  ARTHROSCOPY WITH SUBACROMIAL DECOMPRESSION AND BICEP TENDON REPAIR Right 07/12/2013   Procedure: RIGHT SHOULDER ARTHROSCOPY DEBRIDEMENT EXTENSIVE DECOMPRESSION SUBACROMIAL PARTIAL ACROMIOPLASTY;  Surgeon: Johnny Bridge, MD;  Location: [place];  Service: Orthopedics;  Laterality: Right;  . WRIST ARTHROSCOPY  01/17/2012   Procedure: ARTHROSCOPY WRIST; right wrist Surgeon: Tennis Must, MD;  Location: [place];  Service: Orthopedics;  Laterality: Right;  RIGHT WRIST ARTHROSCOPY WITH TRIANGULAR FIBROCARTILAGE COMPLEX REPAIR AND DEBRIDEMENT     Family History  Problem Relation Age of Onset  . Breast cancer Mother 36       triple  negative  . Leukemia Father   . Lung cancer Father   . Heart attack Maternal Uncle   . Prostate cancer Paternal Uncle   . COPD Paternal Grandmother   . Heart disease Paternal Grandfather   . Prostate cancer Paternal Uncle   . Leukemia Cousin     Social History   Socioeconomic History  . Marital status: Married    Spouse name: Not on file  . Number of children: Not on file  . Years of education: Not on file  . Highest education level: Not on file  Occupational History  . Not on file  Tobacco Use  . Smoking status: Never Smoker  . Smokeless tobacco: Never Used  Vaping Use  . Vaping Use: Never used  Substance and Sexual Activity  . Alcohol use: Yes    Comment: Drinks very rare  . Drug use: No  . Sexual activity: Yes    Birth control/protection: Surgical    Comment: husband has had a vasectomy  Other Topics Concern  . Not on file  Social History Narrative  . Not on file   Social Determinants of Health   Financial Resource Strain:   . Difficulty of Paying Living Expenses: Not on file  Food Insecurity:   . Worried About Charity fundraiser in the Last Year: Not on file  . Ran Out of Food in the Last Year: Not on file  Transportation Needs:   . Lack of Transportation (Medical): Not on file  . Lack of Transportation (Non-Medical): Not on file  Physical Activity:   . Days of Exercise per Week: Not on file  . Minutes of Exercise per Session: Not on file  Stress:   . Feeling of Stress : Not on file  Social Connections:   . Frequency of Communication with Friends and Family: Not on file  . Frequency of Social Gatherings with Friends and Family: Not on file  . Attends Religious Services: Not on file  . Active Member of Clubs or Organizations: Not on file  . Attends Archivist Meetings: Not on file  . Marital Status: Not on file  Intimate Partner Violence:   . Fear of Current or Ex-Partner: Not on file  . Emotionally Abused: Not on file  . Physically Abused:  Not on file  . Sexually Abused: Not on file    Past Medical History, Surgical history, Social history, and Family history were reviewed and updated as appropriate.   Please see review of systems for further details on the patient's review from today.   Review of Systems:  Review of Systems  Constitutional: Negative for chills, diaphoresis and fever.  HENT: Negative for trouble swallowing and voice change.   Respiratory: Negative for cough, chest tightness, shortness of breath and wheezing.   Cardiovascular: Negative for chest pain and palpitations.  Gastrointestinal: Negative for abdominal pain, constipation,  diarrhea, nausea and vomiting.  Musculoskeletal: Positive for myalgias. Negative for back pain.  Neurological: Negative for dizziness, light-headedness and headaches.    Objective:   Physical Exam:  BP 104/75 (BP Location: Right Arm, Patient Position: Sitting)   Pulse 93   Temp 98.5 F (36.9 C) (Tympanic)   Resp 17   Ht 5\' 7"  (1.702 m)   Wt 167 lb 3.2 oz (75.8 kg)   SpO2 100%   BMI 26.19 kg/m  ECOG: 1  Physical Exam Constitutional:      General: She is not in acute distress.    Appearance: Normal appearance. She is not ill-appearing, toxic-appearing or diaphoretic.  HENT:     Head: Normocephalic and atraumatic.  Eyes:     General: No scleral icterus.       Right eye: No discharge.        Left eye: No discharge.     Conjunctiva/sclera: Conjunctivae normal.  Cardiovascular:     Rate and Rhythm: Normal rate and regular rhythm.     Heart sounds: Normal heart sounds. No murmur heard.  No friction rub. No gallop.   Pulmonary:     Effort: Pulmonary effort is normal. No respiratory distress.     Breath sounds: Normal breath sounds. No wheezing, rhonchi or rales.  Musculoskeletal:        General: Tenderness (Tenderness to light touch over the upper and lower extremities bilaterally) present.  Neurological:     Mental Status: She is alert.     Coordination:  Coordination normal.     Gait: Gait normal.  Psychiatric:        Mood and Affect: Mood normal.        Thought Content: Thought content normal.        Judgment: Judgment normal.     Lab Review:     Component Value Date/Time   NA 138 10/17/2019 0816   NA 138 01/27/2017 0802   K 4.2 10/17/2019 0816   K 4.1 01/27/2017 0802   CL 101 10/17/2019 0816   CO2 28 10/17/2019 0816   CO2 26 01/27/2017 0802   GLUCOSE 103 (H) 10/17/2019 0816   GLUCOSE 90 01/27/2017 0802   BUN 9 10/17/2019 0816   BUN 14.0 01/27/2017 0802   CREATININE 0.80 10/17/2019 0816   CREATININE 0.7 01/27/2017 0802   CALCIUM 9.5 10/17/2019 0816   CALCIUM 8.6 01/27/2017 0802   PROT 6.6 10/17/2019 0816   PROT 6.5 01/27/2017 0802   ALBUMIN 3.1 (L) 10/17/2019 0816   ALBUMIN 3.5 01/27/2017 0802   AST 15 10/17/2019 0816   AST 22 01/27/2017 0802   ALT 7 10/17/2019 0816   ALT 23 01/27/2017 0802   ALKPHOS 113 10/17/2019 0816   ALKPHOS 85 01/27/2017 0802   BILITOT 0.3 10/17/2019 0816   BILITOT 0.23 01/27/2017 0802   GFRNONAA >60 10/17/2019 0816   GFRAA >60 10/17/2019 0816       Component Value Date/Time   WBC 3.8 (L) 10/17/2019 0816   WBC 2.6 (L) 07/25/2019 0519   RBC 3.19 (L) 10/17/2019 0816   HGB 10.1 (L) 10/17/2019 0816   HGB 11.1 (L) 01/27/2017 0802   HCT 31.9 (L) 10/17/2019 0816   HCT 33.4 (L) 01/27/2017 0802   PLT 210 10/17/2019 0816   PLT 170 01/27/2017 0802   MCV 100.0 10/17/2019 0816   MCV 99.0 01/27/2017 0802   MCH 31.7 10/17/2019 0816   MCHC 31.7 10/17/2019 0816   RDW 15.4 10/17/2019 0816   RDW 18.2 (H)  01/27/2017 0802   LYMPHSABS 0.7 10/17/2019 0816   LYMPHSABS 1.7 01/27/2017 0802   MONOABS 0.6 10/17/2019 0816   MONOABS 0.5 01/27/2017 0802   EOSABS 0.1 10/17/2019 0816   EOSABS 0.0 01/27/2017 0802   BASOSABS 0.0 10/17/2019 0816   BASOSABS 0.0 01/27/2017 0802   -------------------------------  Imaging from last 24 hours (if applicable):  Radiology interpretation: MR Brain W Wo  Contrast  Addendum Date: 10/14/2019   ADDENDUM REPORT: 10/14/2019 19:07 ADDENDUM: There are 2 punctate contrast enhancing lesions not described in the original report. Both are seen on series 38, image 58. One is in the right paramedian pons and the other is in the superior right cerebellum. Electronically Signed   By: Ulyses Jarred M.D.   On: 10/14/2019 19:07   Addendum Date: 10/01/2019   ADDENDUM REPORT: 10/01/2019 18:54 ADDENDUM: Port Accessed by RN at 11:45 am fall on 09/30/2019 at [place] Electronically Signed   By: Ulyses Jarred M.D.   On: 10/01/2019 18:54   Result Date: 10/14/2019 CLINICAL DATA:  Breast carcinoma staging.  Status post radiation. EXAM: MRI HEAD WITHOUT AND WITH CONTRAST TECHNIQUE: Multiplanar, multiecho pulse sequences of the brain and surrounding structures were obtained without and with intravenous contrast. CONTRAST:  32mL MULTIHANCE GADOBENATE DIMEGLUMINE 529 MG/ML IV SOLN COMPARISON:  None. FINDINGS: Brain: Decreased size and contrast enhancement lesion at the left precentral gyrus (series 38, image 126). There are no new contrast-enhancing lesions. No edema. No acute ischemia or hemorrhage. Vascular: Normal flow voids. Skull and upper cervical spine: Heterogeneous signal in the C3 vertebral body. Sinuses/Orbits: Small amount of right mastoid fluid.  Normal orbits. Other: None IMPRESSION: 1. Decreased size of left precentral gyrus lesion following treatment. 2. No new metastatic lesions. 3. Heterogeneous signal in the C3 vertebral body, concerning for metastatic disease. Electronically Signed: By: Ulyses Jarred M.D. On: 09/30/2019 21:34   MR THORACIC SPINE W WO CONTRAST  Result Date: 09/30/2019 CLINICAL DATA:  Restaging of breast carcinoma with spinal metastases. Status post spinal radiation EXAM: MRI THORACIC WITHOUT AND WITH CONTRAST TECHNIQUE: Multiplanar and multiecho pulse sequences of the thoracic spine were obtained without and with intravenous  contrast. CONTRAST:  46mL MULTIHANCE GADOBENATE DIMEGLUMINE 529 MG/ML IV SOLN COMPARISON:  None. FINDINGS: MRI THORACIC SPINE FINDINGS Alignment:  Normal Vertebrae: There is increased heterogeneity of marrow signal at the T10-L1 levels. T1WI-hyperintense lesion at T8 is unchanged in size. There is no compression fracture. Cord:  Normal Paraspinal and other soft tissues: Small left pleural effusion Disc levels: There is no spinal canal stenosis. No disc herniation or neural impingement. IMPRESSION: 1. T10-L1 signal changes likely due to radiation treatment. No discrete residual lesion at T12. 2. Unchanged size of T1WI-hyperintense T8 lesion with diminished contrast enhancement, which may be a treatment effect. 3. No new metastatic lesions of the thoracic spine. 4. No fracture or stenosis. Electronically Signed   By: Ulyses Jarred M.D.   On: 09/30/2019 21:24        This case was discussed with Dr. Lindi Adie. He expresses agreement with my management of this patient.

## 2019-10-17 ENCOUNTER — Inpatient Hospital Stay: Payer: 59

## 2019-10-17 ENCOUNTER — Telehealth: Payer: Self-pay | Admitting: Internal Medicine

## 2019-10-17 ENCOUNTER — Other Ambulatory Visit: Payer: Self-pay

## 2019-10-17 VITALS — BP 99/77 | HR 93 | Temp 99.0°F | Resp 17

## 2019-10-17 DIAGNOSIS — C7951 Secondary malignant neoplasm of bone: Secondary | ICD-10-CM

## 2019-10-17 DIAGNOSIS — C50512 Malignant neoplasm of lower-outer quadrant of left female breast: Secondary | ICD-10-CM

## 2019-10-17 DIAGNOSIS — Z171 Estrogen receptor negative status [ER-]: Secondary | ICD-10-CM

## 2019-10-17 DIAGNOSIS — G893 Neoplasm related pain (acute) (chronic): Secondary | ICD-10-CM

## 2019-10-17 DIAGNOSIS — Z7189 Other specified counseling: Secondary | ICD-10-CM

## 2019-10-17 DIAGNOSIS — Z95828 Presence of other vascular implants and grafts: Secondary | ICD-10-CM

## 2019-10-17 LAB — CBC WITH DIFFERENTIAL (CANCER CENTER ONLY)
Abs Immature Granulocytes: 0.01 10*3/uL (ref 0.00–0.07)
Basophils Absolute: 0 10*3/uL (ref 0.0–0.1)
Basophils Relative: 1 %
Eosinophils Absolute: 0.1 10*3/uL (ref 0.0–0.5)
Eosinophils Relative: 2 %
HCT: 31.9 % — ABNORMAL LOW (ref 36.0–46.0)
Hemoglobin: 10.1 g/dL — ABNORMAL LOW (ref 12.0–15.0)
Immature Granulocytes: 0 %
Lymphocytes Relative: 19 %
Lymphs Abs: 0.7 10*3/uL (ref 0.7–4.0)
MCH: 31.7 pg (ref 26.0–34.0)
MCHC: 31.7 g/dL (ref 30.0–36.0)
MCV: 100 fL (ref 80.0–100.0)
Monocytes Absolute: 0.6 10*3/uL (ref 0.1–1.0)
Monocytes Relative: 16 %
Neutro Abs: 2.4 10*3/uL (ref 1.7–7.7)
Neutrophils Relative %: 62 %
Platelet Count: 210 10*3/uL (ref 150–400)
RBC: 3.19 MIL/uL — ABNORMAL LOW (ref 3.87–5.11)
RDW: 15.4 % (ref 11.5–15.5)
WBC Count: 3.8 10*3/uL — ABNORMAL LOW (ref 4.0–10.5)
nRBC: 0 % (ref 0.0–0.2)

## 2019-10-17 LAB — CMP (CANCER CENTER ONLY)
ALT: 7 U/L (ref 0–44)
AST: 15 U/L (ref 15–41)
Albumin: 3.1 g/dL — ABNORMAL LOW (ref 3.5–5.0)
Alkaline Phosphatase: 113 U/L (ref 38–126)
Anion gap: 9 (ref 5–15)
BUN: 9 mg/dL (ref 6–20)
CO2: 28 mmol/L (ref 22–32)
Calcium: 9.5 mg/dL (ref 8.9–10.3)
Chloride: 101 mmol/L (ref 98–111)
Creatinine: 0.8 mg/dL (ref 0.44–1.00)
GFR, Est AFR Am: >60 mL/min (ref >60)
GFR, Estimated: >60 mL/min (ref >60)
Glucose, Bld: 103 mg/dL — ABNORMAL HIGH (ref 70–99)
Potassium: 4.2 mmol/L (ref 3.5–5.1)
Sodium: 138 mmol/L (ref 135–145)
Total Bilirubin: 0.3 mg/dL (ref 0.3–1.2)
Total Protein: 6.6 g/dL (ref 6.5–8.1)

## 2019-10-17 LAB — MAGNESIUM: Magnesium: 1.8 mg/dL (ref 1.7–2.4)

## 2019-10-17 LAB — PHOSPHORUS: Phosphorus: 5.7 mg/dL — ABNORMAL HIGH (ref 2.5–4.6)

## 2019-10-17 MED ORDER — PALONOSETRON HCL INJECTION 0.25 MG/5ML
0.2500 mg | Freq: Once | INTRAVENOUS | Status: AC
  Administered 2019-10-17: 0.25 mg via INTRAVENOUS

## 2019-10-17 MED ORDER — SODIUM CHLORIDE 0.9 % IV SOLN
3.0000 mg/kg | Freq: Once | INTRAVENOUS | Status: AC
  Administered 2019-10-17: 220 mg via INTRAVENOUS
  Filled 2019-10-17: qty 22

## 2019-10-17 MED ORDER — FAMOTIDINE IN NACL 20-0.9 MG/50ML-% IV SOLN
20.0000 mg | Freq: Once | INTRAVENOUS | Status: AC
  Administered 2019-10-17: 20 mg via INTRAVENOUS

## 2019-10-17 MED ORDER — ACETAMINOPHEN 325 MG PO TABS
650.0000 mg | ORAL_TABLET | Freq: Once | ORAL | Status: AC
  Administered 2019-10-17: 650 mg via ORAL

## 2019-10-17 MED ORDER — ACETAMINOPHEN 325 MG PO TABS
ORAL_TABLET | ORAL | Status: AC
  Filled 2019-10-17: qty 2

## 2019-10-17 MED ORDER — SODIUM CHLORIDE 0.9% FLUSH
10.0000 mL | INTRAVENOUS | Status: DC | PRN
  Filled 2019-10-17: qty 10

## 2019-10-17 MED ORDER — ATROPINE SULFATE 1 MG/ML IJ SOLN: 0.5000 mg | Freq: Once | INTRAMUSCULAR | Status: DC | PRN

## 2019-10-17 MED ORDER — DEXAMETHASONE SODIUM PHOSPHATE 10 MG/ML IJ SOLN
INTRAMUSCULAR | Status: AC
  Filled 2019-10-17: qty 1

## 2019-10-17 MED ORDER — SODIUM CHLORIDE 0.9 % IV SOLN
150.0000 mg | Freq: Once | INTRAVENOUS | Status: AC
  Administered 2019-10-17: 150 mg via INTRAVENOUS
  Filled 2019-10-17: qty 150

## 2019-10-17 MED ORDER — DIPHENHYDRAMINE HCL 50 MG/ML IJ SOLN
25.0000 mg | Freq: Once | INTRAMUSCULAR | Status: AC
  Administered 2019-10-17: 25 mg via INTRAVENOUS

## 2019-10-17 MED ORDER — SODIUM CHLORIDE 0.9 % IV SOLN
Freq: Once | INTRAVENOUS | Status: AC
  Filled 2019-10-17: qty 250

## 2019-10-17 MED ORDER — SODIUM CHLORIDE 0.9% FLUSH
10.0000 mL | INTRAVENOUS | Status: DC | PRN
  Administered 2019-10-17: 10 mL
  Filled 2019-10-17: qty 10

## 2019-10-17 MED ORDER — DEXAMETHASONE SODIUM PHOSPHATE 10 MG/ML IJ SOLN
5.0000 mg | Freq: Once | INTRAMUSCULAR | Status: AC
  Administered 2019-10-17: 5 mg via INTRAVENOUS

## 2019-10-17 MED ORDER — PALONOSETRON HCL INJECTION 0.25 MG/5ML
INTRAVENOUS | Status: AC
  Filled 2019-10-17: qty 5

## 2019-10-17 MED ORDER — HEPARIN SOD (PORK) LOCK FLUSH 100 UNIT/ML IV SOLN
500.0000 [IU] | Freq: Once | INTRAVENOUS | Status: DC | PRN
  Filled 2019-10-17: qty 5

## 2019-10-17 MED ORDER — DIPHENHYDRAMINE HCL 50 MG/ML IJ SOLN
INTRAMUSCULAR | Status: AC
  Filled 2019-10-17: qty 1

## 2019-10-17 MED ORDER — FAMOTIDINE IN NACL 20-0.9 MG/50ML-% IV SOLN
INTRAVENOUS | Status: AC
  Filled 2019-10-17: qty 50

## 2019-10-17 NOTE — Progress Notes (Signed)
OK to use labs from 9/14 for infusion today per Dr. Lindi Adie

## 2019-10-17 NOTE — Patient Instructions (Signed)
[facility] Discharge Instructions for Patients Receiving Chemotherapy  Today you received the following chemotherapy agents Trodelvy  To help prevent nausea and vomiting after your treatment, we encourage you to take your nausea medication as directed.    If you develop nausea and vomiting that is not controlled by your nausea medication, call the clinic.   BELOW ARE SYMPTOMS THAT SHOULD BE REPORTED IMMEDIATELY:  *FEVER GREATER THAN 100.5 F  *CHILLS WITH OR WITHOUT FEVER  NAUSEA AND VOMITING THAT IS NOT CONTROLLED WITH YOUR NAUSEA MEDICATION  *UNUSUAL SHORTNESS OF BREATH  *UNUSUAL BRUISING OR BLEEDING  TENDERNESS IN MOUTH AND THROAT WITH OR WITHOUT PRESENCE OF ULCERS  *URINARY PROBLEMS  *BOWEL PROBLEMS  UNUSUAL RASH Items with * indicate a potential emergency and should be followed up as soon as possible.  Feel free to call the clinic should you have any questions or concerns. The clinic phone number is [phone].  Please show the CHEMO ALERT CARD at check-in to the Emergency Department and triage nurse.   

## 2019-10-17 NOTE — Patient Instructions (Signed)

## 2019-10-17 NOTE — Telephone Encounter (Signed)
No 9/14 los

## 2019-10-22 ENCOUNTER — Other Ambulatory Visit: Payer: Self-pay

## 2019-10-22 ENCOUNTER — Telehealth: Payer: Self-pay | Admitting: Radiation Oncology

## 2019-10-22 ENCOUNTER — Ambulatory Visit (HOSPITAL_COMMUNITY)
Admission: RE | Admit: 2019-10-22 | Discharge: 2019-10-22 | Disposition: A | Discharge status: Home / Self Care | Payer: 59 | Source: Ambulatory Visit | Attending: Hematology and Oncology | Admitting: Hematology and Oncology

## 2019-10-22 DIAGNOSIS — C50919 Malignant neoplasm of unspecified site of unspecified female breast: Secondary | ICD-10-CM | POA: Insufficient documentation

## 2019-10-22 MED ORDER — IOHEXOL 300 MG/ML  SOLN
100.0000 mL | Freq: Once | INTRAMUSCULAR | Status: AC | PRN
  Administered 2019-10-22: 100 mL via INTRAVENOUS

## 2019-10-22 NOTE — Telephone Encounter (Signed)
I called the patient to let her know that radiology re-read her scans in preparation of tumor board, and an addendum was made of her scan. There are two punctate findings in the addendum: in the superior right cerebellum and pons. Dr. [name] has recommended repeating her scan 6 weeks from the most recent scan. She is in agreement. She has also had progressive heaviness in her legs that was felt to be possible myositis. She has had recent bone marrow stimulation with neulasta for her chemotherapy regimen as well. She denies loss of control of her bladder/bowels or loss of sensation when walking, or in the genital region. She denies any specific complaints of pain at this time. She has seen her CT scan results though which prompted this discussion. I've asked Dr. [name] to evaluate her scan as well to see if he thinks there is a role for any further imaging of her spine or treatment of bony disease.    Carola Rhine, PAC

## 2019-10-23 ENCOUNTER — Telehealth: Payer: Self-pay | Admitting: Radiation Oncology

## 2019-10-23 ENCOUNTER — Other Ambulatory Visit: Payer: Self-pay | Admitting: Radiation Therapy

## 2019-10-23 DIAGNOSIS — C7949 Secondary malignant neoplasm of other parts of nervous system: Secondary | ICD-10-CM

## 2019-10-23 DIAGNOSIS — C7931 Secondary malignant neoplasm of brain: Secondary | ICD-10-CM

## 2019-10-23 MED FILL — Fosaprepitant Dimeglumine For IV Infusion 150 MG (Base Eq): INTRAVENOUS | Qty: 5 | Status: AC

## 2019-10-23 NOTE — Telephone Encounter (Signed)
I called the patient back to let her know that Dr. [name] did review her CT scan and didn't feel that there was any impending concerns with her spinal disease, or elsewhere, and didn't think her lower extremity heaviness was related to those areas. He would recommend a Lumbar MRI if she developed neurologic symptoms of loss of sensation or function. No other areas appeared to be problematic in his review but he would have a low bar to offer further palliative bone treatment if she became symptomatic. The patient did let me know that she is comfortable with the MRI of her brain to re-evaluate the areas documented in the addendum report from her 09/30/19 scan. We will review this discrepancy as well in brain oncology conference on Monday, and still plan to repeat the MRI brain in about 3 weeks time. Our brain oncology navigator will reach out to coordinate this. Upon further discussion, the patient did say she has thought about some pain that is in her right hip area when she is laying on her side at night. I've messaged Dr. [name] about this too since she had prior treatment to her pelvis last year. The area in the right iliac region is not well seen by CT and he favors following this as her pain is not severe, but we could re-evaluate this if needed. She is in agreement with this as well.    Carola Rhine, PAC

## 2019-10-23 NOTE — Progress Notes (Signed)
Orders entered for port access the day of brain MRI at GI.   Mont Dutton R.T.(R)(T) Radiation Special Procedures Navigator

## 2019-10-23 NOTE — Progress Notes (Signed)
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 Malignant neoplasm of lower-outer quadrant of left breast of female, estrogen receptor negative ([place])  C50.512    Z17.1     SUMMARY OF ONCOLOGIC HISTORY: 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, estrogen receptor negative ([place])  10/20/2016 Mammogram   Mammogram and ultrasound of the left breast revealed 1.7 cm mass at 4:00 position, 6:30 position 5 x 4 x 4 mm mass, 6:00 position 5 cm nipple 7 x 6 x 11 mm, left axillary lymph node with thickened cortex, T1c N1 stage II a AJCC 8   10/24/2016 Initial Diagnosis   Left breast biopsy 6:30 position 3 cm from nipple: IDC grade 2, DCIS, ER 0%, PR 0%, Ki-67 15% HER-2 positive ratio 2.1; 4:00 position 3 cm from nipple: IDC grade 2, DCIS, ER 0%, PR 0%, Ki-67 35%, HER-2 positive ratio 2.02; left axillary lymph node biopsy positive   11/04/2016 - 02/17/2017 Neo-Adjuvant Chemotherapy   TCH Perjeta 6 cycles followed by Herceptin + Perjeta maintenance to be completed September 2019   11/30/2016 Genetic Testing   Negative genetic testing on the common hereditary cancer panel.  The Hereditary Gene Panel offered by [facility] includes sequencing and/or deletion duplication testing of the following 47 genes: APC, ATM, AXIN2, BARD1, BMPR1A, BRCA1, BRCA2, BRIP1, CDH1, CDK4, CDKN2A (p14ARF), CDKN2A (p16INK4a), CHEK2, CTNNA1, DICER1, EPCAM (Deletion/duplication testing  only), GREM1 (promoter region deletion/duplication testing only), KIT, MEN1, MLH1, MSH2, MSH3, MSH6, MUTYH, NBN, NF1, NHTL1, PALB2, PDGFRA, PMS2, POLD1, POLE, PTEN, RAD50, RAD51C, RAD51D, SDHB, SDHC, SDHD, SMAD4, SMARCA4. STK11, TP53, TSC1, TSC2, and VHL.  The following genes were evaluated for sequence changes only: SDHA and HOXB13 c.251G>A variant only. The report date is November 30, 2016.    03/27/2017 Surgery   Bilateral mastectomies: Left mastectomy: IDC grade 2 0.9 cm, nodes negative, right mastectomy benign, ER 0%, PR 0%, HER-2 positive ratio 2.6   05/08/2017 - 06/09/2017 Radiation Therapy   Adjuvant radiation therapy   10/23/2017 Miscellaneous   Neratinib discontinued after 4 weeks for severe diarrhea   07/25/2018 Relapse/Recurrence   MRI of right elbow showed bone lesion consistent with malignancy. PET scan showed bilateral pulmonary nodules and several lytic bone lesions compatible with metastatic disease. Brain MRI on 08/02/18 showed no evidence of metastatic disease.   08/02/2018 PET scan   Bilateral hypermetabolic lung nodules, LUL 1.3 cm with SUV 3.88, lingular nodule 1.4 cm SUV 3.7, central lingular nodule 1.2 cm SUV 9.76, right middle lobe nodule 1.5 cm SUV 9.9, lytic bone metastases inferior pubic ramus, sacrum, T12, right 11th rib.   08/08/2018 Procedure   Lung biopsy: metastatic carcinoma, HER-2 negative (0), ER/PR negative.   08/10/2018 -  Radiation Therapy   Palliative radiatio to the right humerus along the medial condyle   08/24/2018 - 09/05/2018 Radiation Therapy   Palliative radiation to the right 11th rib and right elbow   09/26/2018 - 12/04/2018 Chemotherapy   Carboplatin atezolizumab at [facility]  with Dr. Janan Halter on [place] clinical trial stopped because of new T5 metastases (toxicities included myopathy required prednisone, immune mediated thyroiditis), right upper extremity DVT on apixaban   12/14/2018 - 04/10/2019 Chemotherapy   [place]   12/17/2018 -   Radiation Therapy   Palliative radiation to sternal, sacral & pelvic lesions and SRS for T3 and C7-T1 lesions.   04/11/2019 -  Chemotherapy   Sacituzumab-Govitecan Laura Anthony)   Bone metastases ([place])  08/07/2018 Initial Diagnosis   Bone metastases ([place])   12/14/2018 - 04/24/2019 Chemotherapy   The patient had pertuzumab (PERJETA) 420 mg in sodium chloride 0.9 % 250 mL chemo infusion, 420 mg (100 % of original dose 420 mg), Intravenous, Once, 5 of 6 cycles Dose modification: 420 mg (original dose 420 mg, Cycle 1, Reason: Provider Judgment) Administration: 420 mg (12/14/2018), 420 mg (01/10/2019), 420 mg (03/28/2019), 420 mg (01/30/2019), 420 mg (03/01/2019) trastuzumab-dkst (OGIVRI) 600 mg in sodium chloride 0.9 % 250 mL chemo infusion, 609 mg, Intravenous,  Once, 5 of 6 cycles Administration: 600 mg (12/14/2018), 450 mg (01/10/2019), 450 mg (03/28/2019), 450 mg (01/30/2019), 450 mg (03/01/2019)  for chemotherapy treatment.    12/17/2018 -  Radiation Therapy   Palliative radiation to sternal, sacral & pelvic lesions and SRS for T3 and C7-T1 lesions.     CHIEF COMPLIANT: Follow-up of metastatic breast cancer,cycle9day8Trodelvy  INTERVAL HISTORY: Laura Anthony is a 44 y.o. with above-mentioned history of metastatic breast cancer who is currently ontreatment withTrodelvy.CT CAP on 10/22/19 showed progression of osseous metastatic disease and stable pulmonary nodules. She presents to the clinic todayfortreatment and to review her scan.   ALLERGIES:  is allergic to denosumab, statins, sumatriptan, and tape.  MEDICATIONS:  Current Outpatient Medications  Medication Sig Dispense Refill  . ELIQUIS 5 MG TABS tablet Take 1 tablet (5 mg total) by mouth 2 (two) times daily. 60 tablet 3  . gabapentin (NEURONTIN) 300 MG capsule Take 1 tablet by mouth in morning and afternoon, take 2 tablets by mouth at bedtime (Patient taking differently: Take 300-600 mg by mouth in the morning, at noon,  and at bedtime. Take 1 tablet by mouth in morning and afternoon, take 2 tablets by mouth at bedtime) 360 capsule 3  . loperamide (IMODIUM A-D) 2 MG tablet Take 2 at diarrhea onset, then 1 every 2hr until 12hrs with no BM. May take 2 every 4hrs at night. If diarrhea recurs repeat. (Patient taking differently: Take 2 mg by mouth 4 (four) times daily as needed for diarrhea or loose stools. Take 2 at diarrhea onset, then 1 every 2hr until 12hrs with no BM. May take 2 every 4hrs at night. If diarrhea recurs repeat.) 100 tablet 1  . loratadine (CLARITIN) 10 MG tablet Take 10 mg by mouth at bedtime.    Marland Kitchen LORazepam (ATIVAN) 0.5 MG tablet Take 1 tablet (0.5 mg total) by mouth every 6 (six) hours as needed for anxiety. 45 tablet 1  . methadone (DOLOPHINE) 10 MG tablet Take 1 tablet (10 mg total) by mouth every 8 (eight) hours. 90 tablet 0  . naloxone (NARCAN) 4 MG/0.1ML LIQD nasal spray kit Spray once in one nostril prn overdose, may repeat x 1 1 each 1  . ondansetron (ZOFRAN) 8 MG tablet Take 1 tablet (8 mg total) by mouth 2 (two) times daily as needed. Start on the third day after chemotherapy. (Patient taking differently: Take 8 mg by mouth 2 (two) times daily as needed for nausea or vomiting. Start on the  third day after chemotherapy.) 30 tablet 1  . oxyCODONE-acetaminophen (PERCOCET) 10-325 MG tablet Take 1 tablet by mouth every 4 (four) hours as needed for pain. 120 tablet 0  . pantoprazole (PROTONIX) 40 MG tablet TAKE 1 TABLET BY MOUTH TWICE A DAY 180 tablet 1  . predniSONE (DELTASONE) 5 MG tablet 6 tabs x 2 day, 5 tabs x 2 day, 4 tabs x 2 day, 3 tabs x 2 day, 2 tabs x 2 day, 1 tabs x 2 day, stop 42 tablet 0  . prochlorperazine (COMPAZINE) 10 MG tablet Take 1 tablet (10 mg total) by mouth every 6 (six) hours as needed (Nausea or vomiting). 30 tablet 1  . venlafaxine XR (EFFEXOR-XR) 150 MG 24 hr capsule TAKE 1 CAPSULE (150 MG TOTAL) BY MOUTH DAILY WITH BREAKFAST. 90 capsule 3  . zolpidem (AMBIEN CR) 12.5 MG  CR tablet Take 1 tablet (12.5 mg total) by mouth at bedtime. 30 tablet 3   No current facility-administered medications for this visit.    PHYSICAL EXAMINATION: ECOG PERFORMANCE STATUS: 1 - Symptomatic but completely ambulatory  There were no vitals filed for this visit. There were no vitals filed for this visit.  LABORATORY DATA:  I have reviewed the data as listed CMP Latest Ref Rng & Units 10/17/2019 10/15/2019 10/03/2019  Glucose 70 - 99 mg/dL 103(H) 65(L) 105(H)  BUN 6 - 20 mg/dL _0 Creatinine 0.44 - 1.00 mg/dL 0.80 0.86 0.77  Sodium 135 - 145 mmol/L 138 137 140  Potassium 3.5 - 5.1 mmol/L 4.2 4.6 4.0  Chloride 98 - 111 mmol/L 101 102 101  CO2 22 - 32 mmol/L _1 Calcium 8.9 - 10.3 mg/dL 9.5 9.6 10.5(H)  Total Protein 6.5 - 8.1 g/dL 6.6 6.5 6.8  Total Bilirubin 0.3 - 1.2 mg/dL 0.3 0.3 0.2(L)  Alkaline Phos 38 - 126 U/L 113 121 111  AST 15 - 41 U/L _2 ALT 0 - 44 U/L _3 Lab Results  Component Value Date   WBC 3.0 (L) 10/24/2019   HGB 10.9 (L) 10/24/2019   HCT 32.8 (L) 10/24/2019   MCV 97.6 10/24/2019   PLT 214 10/24/2019   NEUTROABS 1.5 (L) 10/24/2019    ASSESSMENT & PLAN:  Malignant neoplasm of lower-outer quadrant of left breast of female, estrogen receptor negative ([place]) 10/24/2016: Left breast biopsy 6:30 position 3 cm from nipple: IDC grade 2, DCIS, ER 0%, PR 0%, Ki-67 15% HER-2 positive ratio 2.1; 4:00 position 3 cm from nipple: IDC grade 2, DCIS, ER 0%, PR 0%, Ki-67 35%, HER-2 positive ratio 2.02 Lymph node biopsy positive  Treatment Summary: 1. Neoadjuvant chemotherapy with TCHPcompleted 02/17/2017 this would be followed by Herceptinand Perjetamaintenance for 1 yearcompleted September 2019 2.Bilateral mastectomies 03/28/2016:Bilateral mastectomies: Left mastectomy: IDC grade 2 0.9 cm, nodes negative, right mastectomy benign, ER 0%, PR 0%, HER-2 positive ratio 2.6 3.Adjuvant radiation4/08/2017 to 06/09/2017 4.Neratinib started  10/12/2017 discontinued due to diarrhea 5. Elbow fracture: Due to metastatic disease, palliative radiation therapy 6. Carboplatin atezolizumab at [facility] on a clinical trial [facility] 043 stopped for progression 12/04/2018 ---------------------------------------------------------------------------------------------------------------- Lung nodule biopsy: Metastatic breast cancer triple negative Current treatment: Sacituzumab-Govitecan days 1 and 8 every 3 weeks with Neulasta on day 9 started 04/11/2019, today is cycle9day 8  Toxicities: 1.Fatigue: Manageable 2. mild nausea 3.Thrombocytopenia:Platelet count 214 4.Anemia: Stablehemoglobin is 10.9  Bone scan 08/15/2019: Improvement in bone metastases. Stable  COVID-19 hospitalization 07/22/2019-07/25/2019: Covid pneumonia treated with  remdesivir  Pain regimen: On methadone and Percocets  She has not required Percocets at all. I refilled her methadone today.  Osteonecrosis of jaw: Discontinued Xgeva. CT chest abdomen pelvis 10/22/2019: Increased number and conspicuity of numerous lytic lesions in the bones.  Lung nodules appear stable  Radiology review: I discussed with her the results of the CT scans showing additional bone metastases.  Recommended sending guardant 360 We discussed multiple treatment options and recommended Xeloda.  I discussed the risks and benefits of Xeloda including the risk of diarrhea, hand-foot syndrome, cytopenias, fatigue. We will start her at 1500 mg p.o. twice daily 2 weeks on 1 week off. She will return to see me 2 weeks after starting treatment with labs.    No orders of the defined types were placed in this encounter.  The patient has a good understanding of the overall plan. she agrees with it. she will call with any problems that may develop before the next visit here.  Total time spent: 30 mins including face to face time and time spent for planning, charting and coordination of  care  Nicholas Lose, MD 10/24/2019  I, Cloyde Reams Dorshimer, am acting as scribe for Dr. Nicholas Lose.  I have reviewed the above documentation for accuracy and completeness, and I agree with the above.

## 2019-10-24 ENCOUNTER — Other Ambulatory Visit: Payer: Self-pay

## 2019-10-24 ENCOUNTER — Inpatient Hospital Stay: Payer: 59

## 2019-10-24 ENCOUNTER — Encounter: Payer: Self-pay | Admitting: *Deleted

## 2019-10-24 ENCOUNTER — Telehealth: Payer: Self-pay

## 2019-10-24 ENCOUNTER — Inpatient Hospital Stay (HOSPITAL_BASED_OUTPATIENT_CLINIC_OR_DEPARTMENT_OTHER): Payer: 59 | Admitting: Hematology and Oncology

## 2019-10-24 ENCOUNTER — Telehealth: Payer: Self-pay | Admitting: Hematology and Oncology

## 2019-10-24 ENCOUNTER — Telehealth: Payer: Self-pay | Admitting: Pharmacist

## 2019-10-24 DIAGNOSIS — Z171 Estrogen receptor negative status [ER-]: Secondary | ICD-10-CM

## 2019-10-24 DIAGNOSIS — E039 Hypothyroidism, unspecified: Secondary | ICD-10-CM

## 2019-10-24 DIAGNOSIS — C50512 Malignant neoplasm of lower-outer quadrant of left female breast: Secondary | ICD-10-CM

## 2019-10-24 DIAGNOSIS — Z7189 Other specified counseling: Secondary | ICD-10-CM

## 2019-10-24 DIAGNOSIS — C7951 Secondary malignant neoplasm of bone: Secondary | ICD-10-CM

## 2019-10-24 LAB — CMP (CANCER CENTER ONLY)
ALT: 15 U/L (ref 0–44)
AST: 19 U/L (ref 15–41)
Albumin: 3.2 g/dL — ABNORMAL LOW (ref 3.5–5.0)
Alkaline Phosphatase: 99 U/L (ref 38–126)
Anion gap: 10 (ref 5–15)
BUN: 10 mg/dL (ref 6–20)
CO2: 27 mmol/L (ref 22–32)
Calcium: 9.5 mg/dL (ref 8.9–10.3)
Chloride: 104 mmol/L (ref 98–111)
Creatinine: 0.82 mg/dL (ref 0.44–1.00)
GFR, Est AFR Am: >60 mL/min (ref >60)
GFR, Estimated: >60 mL/min (ref >60)
Glucose, Bld: 65 mg/dL — ABNORMAL LOW (ref 70–99)
Potassium: 3.4 mmol/L — ABNORMAL LOW (ref 3.5–5.1)
Sodium: 141 mmol/L (ref 135–145)
Total Bilirubin: 0.3 mg/dL (ref 0.3–1.2)
Total Protein: 6.8 g/dL (ref 6.5–8.1)

## 2019-10-24 LAB — CBC WITH DIFFERENTIAL (CANCER CENTER ONLY)
Abs Immature Granulocytes: 0.01 10*3/uL (ref 0.00–0.07)
Basophils Absolute: 0 10*3/uL (ref 0.0–0.1)
Basophils Relative: 1 %
Eosinophils Absolute: 0.1 10*3/uL (ref 0.0–0.5)
Eosinophils Relative: 3 %
HCT: 32.8 % — ABNORMAL LOW (ref 36.0–46.0)
Hemoglobin: 10.9 g/dL — ABNORMAL LOW (ref 12.0–15.0)
Immature Granulocytes: 0 %
Lymphocytes Relative: 29 %
Lymphs Abs: 0.9 10*3/uL (ref 0.7–4.0)
MCH: 32.4 pg (ref 26.0–34.0)
MCHC: 33.2 g/dL (ref 30.0–36.0)
MCV: 97.6 fL (ref 80.0–100.0)
Monocytes Absolute: 0.5 10*3/uL (ref 0.1–1.0)
Monocytes Relative: 16 %
Neutro Abs: 1.5 10*3/uL — ABNORMAL LOW (ref 1.7–7.7)
Neutrophils Relative %: 51 %
Platelet Count: 214 10*3/uL (ref 150–400)
RBC: 3.36 MIL/uL — ABNORMAL LOW (ref 3.87–5.11)
RDW: 14.9 % (ref 11.5–15.5)
WBC Count: 3 10*3/uL — ABNORMAL LOW (ref 4.0–10.5)
nRBC: 0 % (ref 0.0–0.2)

## 2019-10-24 LAB — PHOSPHORUS: Phosphorus: 5 mg/dL — ABNORMAL HIGH (ref 2.5–4.6)

## 2019-10-24 LAB — TSH: TSH: 4.827 u[IU]/mL — ABNORMAL HIGH (ref 0.308–3.960)

## 2019-10-24 LAB — MAGNESIUM: Magnesium: 1.8 mg/dL (ref 1.7–2.4)

## 2019-10-24 MED ORDER — CAPECITABINE 500 MG PO TABS: 800.0000 mg/m2 | ORAL_TABLET | Freq: Two times a day (BID) | ORAL | 3 refills | Status: DC

## 2019-10-24 MED ORDER — METHADONE HCL 10 MG PO TABS: 10.0000 mg | ORAL_TABLET | Freq: Three times a day (TID) | ORAL | 0 refills | Status: DC

## 2019-10-24 NOTE — Assessment & Plan Note (Signed)
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 Neoadjuvant chemotherapy with TCHPcompleted 02/17/2017 this would be followed by Herceptinand Perjetamaintenance for 1 yearcompleted September 2019 2.Bilateral mastectomies 03/28/2016:Bilateral mastectomies: Left mastectomy: IDC grade 2 0.9 cm, nodes negative, right mastectomy benign, ER 0%, PR 0%, HER-2 positive ratio 2.6 3.Adjuvant radiation4/08/2017 to 06/09/2017 4.Neratinib started 10/12/2017 discontinued due to diarrhea 5. Elbow fracture: Due to metastatic disease, palliative radiation therapy 6. Carboplatin atezolizumab at [facility] on a clinical trial [facility] 043 stopped for progression 12/04/2018 ---------------------------------------------------------------------------------------------------------------- Lung nodule biopsy: Metastatic breast cancer triple negative Current treatment: Sacituzumab-Govitecan days 1 and 8 every 3 weeks with Neulasta on day 9 started 04/11/2019, today is cycle9day 8  Toxicities: 1.Fatigue: Manageable 2. mild nausea 3.Thrombocytopenia:Platelet count 165 4.Anemia: Stablehemoglobin is 10.8  Bone scan 08/15/2019: Improvement in bone metastases. Stable  COVID-19 hospitalization 07/22/2019-07/25/2019: Covid pneumonia treated with remdesivir  Pain regimen: On methadone and Percocets  She has not required Percocets at all. I refilled her methadone today.  Osteonecrosis of jaw: Discontinued Xgeva. CT chest abdomen pelvis 10/22/2019: Increased number and conspicuity of numerous lytic lesions in the bones.  Lung nodules appear stable  Radiology review: I discussed with her the results of the CT scans showing additional bone metastases.  Recommended sending  guardant 360 Continue with [name] for 3 more months and then repeat scans

## 2019-10-24 NOTE — Telephone Encounter (Signed)
Oral Oncology Patient Advocate Encounter  Received notification from [facility] that prior authorization for Xeloda is required.  PA submitted on CoverMyMeds Key [id number] Status is pending  Oral Oncology Clinic will continue to follow.  [place] Patient [place] Phone [phone] Fax [phone] 10/24/2019 9:02 AM

## 2019-10-24 NOTE — Telephone Encounter (Signed)
Oral Oncology Pharmacist Encounter  Received new prescription for Xeloda (capecitabine) for the treatment of metastatic triple negative breast cancer, planned duration until disease progression or unacceptable drug toxicity.  Prescription dose and frequency assessed for appropriateness. Appropriate for therapy initiation.   CBC w/ Diff, CMP, and Mg from 10/24/19 assessed, noted patient with WBC 3.0 K/uL and ANC 1.5 K/uL - patient will have follow-up labs with MD 2 weeks after therapy initiation   Current medication list in Epic reviewed, DDIs with Xeloda identified: Category C DDI between Xeloda and Pantoprazole - proton-pump inhibitors can decrease efficacy of Xeloda - will discuss with patient alternatives to pantoprazole, such as H2RA's like famotidine while on Xeloda. Category C DDI between Xeloda and Methadone - caution for possible risk of QTc interval prolongation. Noted patient on methadone since 01/2019 and previous Qtc from 07/2019 was stable (481ms) - recommend monitoring as clinically indicated. Recommend consideration of PO potassium replacement x 1 for current K of 3.4 mmol/L. MD notified.   Evaluated chart and no patient barriers to medication adherence noted.   Prescription is required to be filled through [facility] per patient's insurance requirements.   Oral Oncology Clinic will continue to follow for insurance authorization, copayment issues, initial counseling and start date.  Leron Croak, PharmD, BCPS Hematology/Oncology Clinical Pharmacist [facility] [phone] 10/24/2019 9:23 AM

## 2019-10-24 NOTE — Telephone Encounter (Signed)
Oral Oncology Patient Advocate Encounter  Prior Authorization for Xeloda has been approved.    PA# [id number] Effective dates: 10/24/19 through 10/23/20  Patient must fill at [facility] will continue to follow.   [place] Patient [place] Phone [phone] Fax [phone] 10/24/2019 9:13 AM

## 2019-10-24 NOTE — Telephone Encounter (Signed)
Scheduled apt per 9/23 sch msg - pt is aware.

## 2019-10-25 ENCOUNTER — Inpatient Hospital Stay: Payer: 59

## 2019-10-25 NOTE — Telephone Encounter (Signed)
Oral Chemotherapy Pharmacist Encounter  I spoke with patient for overview of: Xeloda (capecitabine) for the  treatment of metastatic triple negative breast cancer, planned duration until disease progression or unacceptable drug toxicity.  Counseled patient on administration, dosing, side effects, monitoring, drug-food interactions, safe handling, storage, and disposal.  Patient will take Xeloda 500mg  tablets, 3 tablets (1500mg ) by mouth in AM and 3 tabs (1500mg ) by mouth in PM, within 30 minutes of finishing meals, for 14 days on, 7 days off, repeated every 21 days.  Xeloda start date: 10/29/19  Adverse effects include but are not limited to: fatigue, decreased blood counts, GI upset, diarrhea, mouth sores, and hand-foot syndrome.   Patient will obtain anti diarrheal and alert the office of 4 or more loose stools above baseline.  Reviewed with patient importance of keeping a medication schedule and plan for any missed doses. No barriers to medication adherence identified.  Discussed drug-drug interactions identified between Xeloda and Protonix as well as Xeloda and methadone.   Patient plans to taper off Protonix since this can decrease efficacy of Xeloda. She plans to taper off by taking Protonix 40 mg tablet, 1 tablet by mouth once daily x 1 week, then decrease to Protonix 40 mg tablet, 1 tabletby mouth every other day x 1 week, and then stop thereafter. She is aware that she can take Pepcid 20 mg by mouth PRN for acid reflux if needed since this does not interact with Xeloda.   Patient aware of risk of QTc prolongation between Xeloda and methadone - patient knows to seek immediate emergency assistance for chest pain/SOB.   Medication reconciliation performed and medication/allergy list updated.  Patient's insurance requires that Xeloda be filled through [facility]. Confirmed with their pharmacy that Xeloda will deliver to patient's home on 10/28/19. Patient provided with contact  information for [facility] (phone number [phone])  All questions answered.  Ms. Howser voiced understanding and appreciation.   Medication education handout and medication calendar placed in mail for patient. Patient knows to call the office with questions or concerns. Oral Chemotherapy Clinic phone number provided to patient.   Leron Croak, PharmD, BCPS Hematology/Oncology Clinical Pharmacist [facility] [phone] 10/25/2019 10:47 AM

## 2019-10-28 ENCOUNTER — Inpatient Hospital Stay: Payer: 59

## 2019-10-31 ENCOUNTER — Encounter: Payer: Self-pay | Admitting: *Deleted

## 2019-10-31 ENCOUNTER — Encounter: Payer: Self-pay | Admitting: Hematology and Oncology

## 2019-11-01 ENCOUNTER — Other Ambulatory Visit: Payer: Self-pay | Admitting: *Deleted

## 2019-11-01 LAB — GUARDANT 360

## 2019-11-01 MED ORDER — LEVOTHYROXINE SODIUM 75 MCG PO TABS: 75.0000 ug | ORAL_TABLET | Freq: Every day | ORAL | 6 refills | Status: DC

## 2019-11-07 ENCOUNTER — Other Ambulatory Visit: Payer: 59

## 2019-11-07 ENCOUNTER — Ambulatory Visit: Payer: 59

## 2019-11-11 ENCOUNTER — Telehealth: Payer: Self-pay | Admitting: *Deleted

## 2019-11-11 NOTE — Telephone Encounter (Signed)
Received a call from [facility] at [place] stating pt responded to their symptom tracker with severe fatigue 8/10 that interfered with her daily activity. As well as moderate pain 5/10 and changes to hands and feet which were painful. Called to f/u with pt on call. Message was left for pt to call office.

## 2019-11-13 ENCOUNTER — Ambulatory Visit
Admission: RE | Admit: 2019-11-13 | Discharge: 2019-11-13 | Disposition: A | Discharge status: Home / Self Care | Payer: 59 | Source: Ambulatory Visit | Attending: Radiation Oncology | Admitting: Radiation Oncology

## 2019-11-13 ENCOUNTER — Telehealth: Payer: Self-pay | Admitting: *Deleted

## 2019-11-13 ENCOUNTER — Other Ambulatory Visit: Payer: Self-pay

## 2019-11-13 ENCOUNTER — Encounter: Payer: Self-pay | Admitting: *Deleted

## 2019-11-13 DIAGNOSIS — C7931 Secondary malignant neoplasm of brain: Secondary | ICD-10-CM

## 2019-11-13 DIAGNOSIS — C7949 Secondary malignant neoplasm of other parts of nervous system: Secondary | ICD-10-CM

## 2019-11-13 MED ORDER — GADOBENATE DIMEGLUMINE 529 MG/ML IV SOLN
20.0000 mL | Freq: Once | INTRAVENOUS | Status: AC | PRN
  Administered 2019-11-13: 20 mL via INTRAVENOUS

## 2019-11-13 MED ORDER — HEPARIN SOD (PORK) LOCK FLUSH 100 UNIT/ML IV SOLN
500.0000 [IU] | Freq: Once | INTRAVENOUS | Status: AC
  Administered 2019-11-13: 500 [IU] via INTRAVENOUS

## 2019-11-13 MED ORDER — SODIUM CHLORIDE 0.9% FLUSH: 10.0000 mL | INTRAVENOUS | Status: DC | PRN

## 2019-11-13 NOTE — Telephone Encounter (Signed)
Received call from pt stating she is experiencing symptoms of hand and foot syndrome on bilateral feet only.  Pt states symptoms began 4 days ago and currently has two callus like areas on bilateral feet.  Pt denies pain or trouble walking.  Pt states she is currently taking two tablets of Xeloda 500 mg (1,000 mg total) twice a day.  Per MD pt to start taking one tablet (500 mg total) in the morning and two tablets (1,000 mg total) in the evening.  If symptoms become worse with medication adjustment pt advised to call the office.  Pt verbalized understanding.

## 2019-11-14 ENCOUNTER — Ambulatory Visit: Payer: 59

## 2019-11-14 ENCOUNTER — Other Ambulatory Visit: Payer: Self-pay

## 2019-11-14 ENCOUNTER — Other Ambulatory Visit: Payer: 59

## 2019-11-14 ENCOUNTER — Ambulatory Visit: Payer: 59 | Admitting: Hematology and Oncology

## 2019-11-14 ENCOUNTER — Encounter: Payer: Self-pay | Admitting: Radiation Oncology

## 2019-11-15 ENCOUNTER — Ambulatory Visit: Payer: 59

## 2019-11-16 ENCOUNTER — Encounter: Payer: Self-pay | Admitting: Radiation Oncology

## 2019-11-16 NOTE — Progress Notes (Signed)
Delayed entry from conversation 11/14/2019 I called and spoke with the patient today and we reviewed the findings from her MRI scan.  She had already reviewed them from my chart.  I explained that our physics staff had fused her prior treatment plan into the current images, and Dr. [name] has had a chance to review this which do show 5 new lesions that are all subcentimeter.  Fortunately the patient is not symptomatic at this time.  W e discussed the rationale for consideration of stereotactic radiosurgery to these 5 locations. She is in agreement to proceed, we reviewed the risks, benefits, short and long-term effects of radio therapy and she would like to proceed. She is going to be contacted by our navigator to coordinate simulation on Monday 11/18/2019 at which time she will sign written consent to proceed.

## 2019-11-18 ENCOUNTER — Telehealth: Payer: Self-pay | Admitting: Radiation Oncology

## 2019-11-18 ENCOUNTER — Ambulatory Visit: Payer: 59

## 2019-11-18 ENCOUNTER — Ambulatory Visit: Payer: 59 | Admitting: Radiation Oncology

## 2019-11-18 ENCOUNTER — Inpatient Hospital Stay: Payer: 59

## 2019-11-18 ENCOUNTER — Ambulatory Visit
Admission: RE | Admit: 2019-11-18 | Discharge: 2019-11-18 | Disposition: A | Discharge status: Home / Self Care | Payer: 59 | Source: Ambulatory Visit | Attending: Radiation Oncology | Admitting: Radiation Oncology

## 2019-11-18 NOTE — Telephone Encounter (Signed)
I called the patient to let her know that conference disucssion this am revealed some concerns that the three "new" lesions seen on her MRI, were in fact there in August 2020 MRI in retrospect. Dr. Mickeal Skinner and Dr. [name] discussed her case and Dr. Mickeal Skinner has recommended reaching out to Dr. Lindi Adie to see how he feels about holding off on SRS, versus pursuing this as Dr. Mickeal Skinner thinks there may be a better oncologic targeted therapy that would have a 50% chance of crossing into the brain over the blood brain barrier to potentially offer more localized control in the brain rather than proceeding now with SRS. The patient is open to the conversation and is interested in hearing Dr. Geralyn Flash thoughts, so we will postpone simulation today until this is further clarified.    Carola Rhine, PAC

## 2019-11-18 NOTE — Telephone Encounter (Signed)
I called the patient back to follow up after we heard back from Dr. Lindi Adie. He is not in favor of the medication suggested by the brain oncology conference discussion based on her most recent pathology prognostics being triple negative. He recommends proceeding with SRS as initially planned. The patient is in agreement with this plan and will come in tomorrow to proceed with simulation. She has someone to drive her and will take ativan 30 min prior to avoid claustrophobia with the mask.

## 2019-11-18 NOTE — Progress Notes (Signed)
Has armband been applied?  Yes  Does patient have an allergy to IV contrast dye?: No    Has patient ever received premedication for IV contrast dye?:  n/a  Does patient take metformin?: No  If patient does take metformin when was the last dose: n/a  Date of lab work: 10/24/2019 BUN: 10 CR: 0.82 eGfr: >60  IV site: Right Chest Port  Has IV site been added to flowsheet?  Yes

## 2019-11-19 ENCOUNTER — Ambulatory Visit
Admission: RE | Admit: 2019-11-19 | Discharge: 2019-11-19 | Disposition: A | Discharge status: Home / Self Care | Payer: 59 | Source: Ambulatory Visit | Attending: Radiation Oncology | Admitting: Radiation Oncology

## 2019-11-19 ENCOUNTER — Other Ambulatory Visit: Payer: Self-pay

## 2019-11-19 DIAGNOSIS — C7931 Secondary malignant neoplasm of brain: Secondary | ICD-10-CM | POA: Diagnosis not present

## 2019-11-19 DIAGNOSIS — Z510 Encounter for antineoplastic radiation therapy: Secondary | ICD-10-CM | POA: Diagnosis not present

## 2019-11-19 MED ORDER — HEPARIN SOD (PORK) LOCK FLUSH 100 UNIT/ML IV SOLN
500.0000 [IU] | Freq: Once | INTRAVENOUS | Status: AC
  Administered 2019-11-19: 500 [IU] via INTRAVENOUS

## 2019-11-19 MED ORDER — SODIUM CHLORIDE 0.9% FLUSH
10.0000 mL | Freq: Once | INTRAVENOUS | Status: AC
  Administered 2019-11-19: 10 mL via INTRAVENOUS

## 2019-11-20 NOTE — Assessment & Plan Note (Signed)
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 Neoadjuvant chemotherapy with TCHPcompleted 02/17/2017 this would be followed by Herceptinand Perjetamaintenance for 1 yearcompleted September 2019 2.Bilateral mastectomies 03/28/2016:Bilateral mastectomies: Left mastectomy: IDC grade 2 0.9 cm, nodes negative, right mastectomy benign, ER 0%, PR 0%, HER-2 positive ratio 2.6 3.Adjuvant radiation4/08/2017 to 06/09/2017 4.Neratinib started 10/12/2017 discontinued due to diarrhea 5. Elbow fracture: Due to metastatic disease, palliative radiation therapy 6. Carboplatin atezolizumab at [facility] on a clinical trial [facility] 043 stopped for progression 12/04/2018 ---------------------------------------------------------------------------------------------------------------- Lung nodule biopsy: Metastatic breast cancer triple negative Current treatment: Sacituzumab-Govitecan days 1 and 8 every 3 weeks with Neulasta on day 9 started 04/11/2019, today is cycle10  Toxicities: 1.Fatigue: Manageable 2.mild nausea 3.Thrombocytopenia:Platelet count214 4.Anemia: Stablehemoglobin is10.9  Bone scan 08/15/2019: Improvement in bone metastases. Stable  COVID-19 hospitalization 07/22/2019-07/25/2019: Covid pneumonia treated with remdesivir  Pain regimen: On methadone and Percocets  She has not required Percocets at all.  Osteonecrosis of jaw: Discontinued Xgeva. CT chest abdomen pelvis 10/22/2019: Increased number and conspicuity of numerous lytic lesions in the bones.  Lung nodules appear stable  Radiology review: I discussed with her the results of the CT scans showing additional bone metastases.  Recommended sending guardant 360 We discussed multiple  treatment options and recommended Xeloda.  I discussed the risks and benefits of Xeloda including the risk of diarrhea, hand-foot syndrome, cytopenias, fatigue. We will start her at 1500 mg p.o. twice daily 2 weeks on 1 week off. She will return to see me 2 weeks after starting treatment with labs.

## 2019-11-20 NOTE — Progress Notes (Signed)
 Patient Care Team: Badger, [name], MD as PCP - General (Family Medicine) Stuart, [name], RN as Oncology Nurse Navigator Martini, [name], RN as Oncology Nurse Navigator Serpe, [name], NP as Nurse Practitioner (Hospice and Palliative Medicine) Karmon Andis, MD as Consulting Physician (Hematology and Oncology) Causey, Lindsey Cornetto, NP as Nurse Practitioner (Hematology and Oncology)  DIAGNOSIS:    ICD-10-CM   1. Malignant neoplasm of lower-outer quadrant of left breast of female, estrogen receptor negative (HCC)  C50.512    Z17.1     SUMMARY OF ONCOLOGIC HISTORY: 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, estrogen receptor negative (HCC)  10/20/2016 Mammogram   Mammogram and ultrasound of the left breast revealed 1.7 cm mass at 4:00 position, 6:30 position 5 x 4 x 4 mm mass, 6:00 position 5 cm nipple 7 x 6 x 11 mm, left axillary lymph node with thickened cortex, T1c N1 stage II a AJCC 8   10/24/2016 Initial Diagnosis   Left breast biopsy 6:30 position 3 cm from nipple: IDC grade 2, DCIS, ER 0%, PR 0%, Ki-67 15% HER-2 positive ratio 2.1; 4:00 position 3 cm from nipple: IDC grade 2, DCIS, ER 0%, PR 0%, Ki-67 35%, HER-2 positive ratio 2.02; left axillary lymph node biopsy positive   11/04/2016 - 02/17/2017 Neo-Adjuvant Chemotherapy   TCH Perjeta 6 cycles followed by Herceptin + Perjeta maintenance to be completed September 2019   11/30/2016 Genetic Testing   Negative genetic testing on the common hereditary cancer panel.  The Hereditary Gene Panel offered by [facility] includes sequencing and/or deletion duplication testing of the following 47 genes: APC, ATM, AXIN2, BARD1, BMPR1A, BRCA1, BRCA2, BRIP1, CDH1, CDK4, CDKN2A (p14ARF), CDKN2A (p16INK4a), CHEK2, CTNNA1, DICER1, EPCAM (Deletion/duplication testing only), GREM1 (promoter region deletion/duplication testing only), KIT, MEN1, MLH1, MSH2, MSH3, MSH6, MUTYH, NBN, NF1, NHTL1, PALB2, PDGFRA,  PMS2, POLD1, POLE, PTEN, RAD50, RAD51C, RAD51D, SDHB, SDHC, SDHD, SMAD4, SMARCA4. STK11, TP53, TSC1, TSC2, and VHL.  The following genes were evaluated for sequence changes only: SDHA and HOXB13 c.251G>A variant only. The report date is November 30, 2016.    03/27/2017 Surgery   Bilateral mastectomies: Left mastectomy: IDC grade 2 0.9 cm, nodes negative, right mastectomy benign, ER 0%, PR 0%, HER-2 positive ratio 2.6   05/08/2017 - 06/09/2017 Radiation Therapy   Adjuvant radiation therapy   10/23/2017 Miscellaneous   Neratinib discontinued after 4 weeks for severe diarrhea   07/25/2018 Relapse/Recurrence   MRI of right elbow showed bone lesion consistent with malignancy. PET scan showed bilateral pulmonary nodules and several lytic bone lesions compatible with metastatic disease. Brain MRI on 08/02/18 showed no evidence of metastatic disease.   08/02/2018 PET scan   Bilateral hypermetabolic lung nodules, LUL 1.3 cm with SUV 3.88, lingular nodule 1.4 cm SUV 3.7, central lingular nodule 1.2 cm SUV 9.76, right middle lobe nodule 1.5 cm SUV 9.9, lytic bone metastases inferior pubic ramus, sacrum, T12, right 11th rib.   08/08/2018 Procedure   Lung biopsy: metastatic carcinoma, HER-2 negative (0), ER/PR negative.   08/10/2018 -  Radiation Therapy   Palliative radiatio to the right humerus along the medial condyle   08/24/2018 - 09/05/2018 Radiation Therapy   Palliative radiation to the right 11th rib and right elbow   09/26/2018 - 12/04/2018 Chemotherapy   Carboplatin atezolizumab at [facility] with Dr. Lisa Carey on [facility] 043 clinical trial stopped because of new T5 metastases (toxicities included myopathy required prednisone, immune mediated thyroiditis), right upper   extremity DVT on apixaban   12/14/2018 - 04/10/2019 Chemotherapy   [place]   12/17/2018 -  Radiation Therapy   Palliative radiation to sternal, sacral & pelvic lesions and SRS for T3 and C7-T1 lesions.   04/11/2019 -  Chemotherapy    Sacituzumab-Govitecan [name])   Bone metastases ([place])  08/07/2018 Initial Diagnosis   Bone metastases ([place])   12/14/2018 - 03/28/2019 Chemotherapy   The patient had pertuzumab (PERJETA) 420 mg in sodium chloride 0.9 % 250 mL chemo infusion, 420 mg (100 % of original dose 420 mg), Intravenous, Once, 5 of 6 cycles Dose modification: 420 mg (original dose 420 mg, Cycle 1, Reason: Provider Judgment) Administration: 420 mg (12/14/2018), 420 mg (01/10/2019), 420 mg (03/28/2019), 420 mg (01/30/2019), 420 mg (03/01/2019) trastuzumab-dkst (OGIVRI) 600 mg in sodium chloride 0.9 % 250 mL chemo infusion, 609 mg, Intravenous,  Once, 5 of 6 cycles Administration: 600 mg (12/14/2018), 450 mg (01/10/2019), 450 mg (03/28/2019), 450 mg (01/30/2019), 450 mg (03/01/2019)  for chemotherapy treatment.    12/17/2018 -  Radiation Therapy   Palliative radiation to sternal, sacral & pelvic lesions and SRS for T3 and C7-T1 lesions.     CHIEF COMPLIANT: Follow-up of metastatic breast cancer  INTERVAL HISTORY: Laura Anthony is a 44 y.o. with above-mentioned history of metastatic breast cancer who is currently ontreatment with Xeloda. She presents to the clinic todayfora toxicity check.   ALLERGIES:  is allergic to denosumab, statins, sumatriptan, and tape.  MEDICATIONS:  Current Outpatient Medications  Medication Sig Dispense Refill  . capecitabine (XELODA) 500 MG tablet Take 3 tablets (1,500 mg total) by mouth 2 (two) times daily after a meal. Take 14 days on, 7 days off, repeat every 21 days. 84 tablet 3  . ELIQUIS 5 MG TABS tablet Take 1 tablet (5 mg total) by mouth 2 (two) times daily. 60 tablet 3  . gabapentin (NEURONTIN) 300 MG capsule Take 1 tablet by mouth in morning and afternoon, take 2 tablets by mouth at bedtime (Patient taking differently: Take 300-600 mg by mouth in the morning, at noon, and at bedtime. Take 1 tablet by mouth in morning and afternoon, take 2 tablets by mouth at bedtime) 360  capsule 3  . levothyroxine (SYNTHROID) 75 MCG tablet Take 1 tablet (75 mcg total) by mouth daily before breakfast. 30 tablet 6  . lidocaine-prilocaine (EMLA) cream Apply topically daily.    Marland Kitchen loperamide (IMODIUM A-D) 2 MG tablet Take 2 at diarrhea onset, then 1 every 2hr until 12hrs with no BM. May take 2 every 4hrs at night. If diarrhea recurs repeat. (Patient not taking: Reported on 11/14/2019) 100 tablet 1  . LORazepam (ATIVAN) 0.5 MG tablet Take 1 tablet (0.5 mg total) by mouth every 6 (six) hours as needed for anxiety. 45 tablet 1  . methadone (DOLOPHINE) 10 MG tablet Take 1 tablet (10 mg total) by mouth every 8 (eight) hours. 90 tablet 0  . naloxone (NARCAN) 4 MG/0.1ML LIQD nasal spray kit Spray once in one nostril prn overdose, may repeat x 1 (Patient not taking: Reported on 11/14/2019) 1 each 1  . ondansetron (ZOFRAN) 8 MG tablet Take 1 tablet (8 mg total) by mouth 2 (two) times daily as needed. Start on the third day after chemotherapy. (Patient not taking: Reported on 11/14/2019) 30 tablet 1  . oxyCODONE-acetaminophen (PERCOCET) 10-325 MG tablet Take 1 tablet by mouth every 4 (four) hours as needed for pain. 120 tablet 0  . pantoprazole (PROTONIX) 40 MG tablet TAKE 1 TABLET  BY MOUTH TWICE A DAY (Patient not taking: Reported on 11/14/2019) 180 tablet 1  . predniSONE (DELTASONE) 5 MG tablet 6 tabs x 2 day, 5 tabs x 2 day, 4 tabs x 2 day, 3 tabs x 2 day, 2 tabs x 2 day, 1 tabs x 2 day, stop (Patient not taking: Reported on 11/14/2019) 42 tablet 0  . prochlorperazine (COMPAZINE) 10 MG tablet Take 1 tablet (10 mg total) by mouth every 6 (six) hours as needed (Nausea or vomiting). 30 tablet 1  . venlafaxine XR (EFFEXOR-XR) 150 MG 24 hr capsule TAKE 1 CAPSULE (150 MG TOTAL) BY MOUTH DAILY WITH BREAKFAST. 90 capsule 3  . zolpidem (AMBIEN CR) 12.5 MG CR tablet Take 1 tablet (12.5 mg total) by mouth at bedtime. 30 tablet 3   No current facility-administered medications for this visit.    PHYSICAL  EXAMINATION: ECOG PERFORMANCE STATUS: 1 - Symptomatic but completely ambulatory  Vitals:   11/21/19 1039  BP: 111/69  Pulse: 88  Resp: 18  Temp: (!) 97.1 F (36.2 C)  SpO2: 95%   Filed Weights   11/21/19 1039  Weight: 166 lb 11.2 oz (75.6 kg)    LABORATORY DATA:  I have reviewed the data as listed CMP Latest Ref Rng & Units 10/24/2019 10/17/2019 10/15/2019  Glucose 70 - 99 mg/dL 65(L) 103(H) 65(L)  BUN 6 - 20 mg/dL _0 Creatinine 0.44 - 1.00 mg/dL 0.82 0.80 0.86  Sodium 135 - 145 mmol/L 141 138 137  Potassium 3.5 - 5.1 mmol/L 3.4(L) 4.2 4.6  Chloride 98 - 111 mmol/L 104 101 102  CO2 22 - 32 mmol/L _1 Calcium 8.9 - 10.3 mg/dL 9.5 9.5 9.6  Total Protein 6.5 - 8.1 g/dL 6.8 6.6 6.5  Total Bilirubin 0.3 - 1.2 mg/dL 0.3 0.3 0.3  Alkaline Phos 38 - 126 U/L 99 113 121  AST 15 - 41 U/L _2 ALT 0 - 44 U/L _3 Lab Results  Component Value Date   WBC 4.2 11/21/2019   HGB 11.3 (L) 11/21/2019   HCT 33.9 (L) 11/21/2019   MCV 98.3 11/21/2019   PLT 196 11/21/2019   NEUTROABS 2.5 11/21/2019    ASSESSMENT & PLAN:  Malignant neoplasm of lower-outer quadrant of left breast of female, estrogen receptor negative ([place]) 10/24/2016: Left breast biopsy 6:30 position 3 cm from nipple: IDC grade 2, DCIS, ER 0%, PR 0%, Ki-67 15% HER-2 positive ratio 2.1; 4:00 position 3 cm from nipple: IDC grade 2, DCIS, ER 0%, PR 0%, Ki-67 35%, HER-2 positive ratio 2.02 Lymph node biopsy positive  Treatment Summary: 1. Neoadjuvant chemotherapy with TCHPcompleted 02/17/2017 this would be followed by Herceptinand Perjetamaintenance for 1 yearcompleted September 2019 2.Bilateral mastectomies 03/28/2016:Bilateral mastectomies: Left mastectomy: IDC grade 2 0.9 cm, nodes negative, right mastectomy benign, ER 0%, PR 0%, HER-2 positive ratio 2.6 3.Adjuvant radiation4/08/2017 to 06/09/2017 4.Neratinib started 10/12/2017 discontinued due to diarrhea 5. Elbow fracture: Due to metastatic  disease, palliative radiation therapy 6. Carboplatin atezolizumab at [facility] on a clinical trial [facility] 043 stopped for progression 12/04/2018 7.  [name] discontinued because of progression 8.  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  Osteonecrosis of jaw: Discontinued Xgeva. CT chest abdomen pelvis 10/22/2019: Increased number and conspicuity of numerous lytic lesions in the bones.  Lung nodules appear stable New brain metastases: SRS being planned  Return to clinic in 4 weeks with labs and follow-up.   No orders of the defined types were placed in this encounter.  The patient has a good understanding of the overall plan. she agrees with it. she will call with any problems that may develop before the next visit here.  Total time spent: 30 mins including face to face time and time spent for planning, charting and coordination of care  Nicholas Lose, MD 11/21/2019  I, Cloyde Reams Dorshimer, am acting as scribe for Dr. Nicholas Lose.  I have reviewed the above documentation for accuracy and completeness, and I agree with the above.

## 2019-11-21 ENCOUNTER — Encounter: Payer: Self-pay | Admitting: *Deleted

## 2019-11-21 ENCOUNTER — Other Ambulatory Visit: Payer: Self-pay

## 2019-11-21 ENCOUNTER — Ambulatory Visit: Payer: 59 | Admitting: Hematology and Oncology

## 2019-11-21 ENCOUNTER — Inpatient Hospital Stay: Payer: 59

## 2019-11-21 ENCOUNTER — Other Ambulatory Visit: Payer: 59

## 2019-11-21 ENCOUNTER — Ambulatory Visit: Payer: 59

## 2019-11-21 ENCOUNTER — Inpatient Hospital Stay: Payer: 59 | Attending: Hematology and Oncology | Admitting: Hematology and Oncology

## 2019-11-21 DIAGNOSIS — Z9013 Acquired absence of bilateral breasts and nipples: Secondary | ICD-10-CM | POA: Insufficient documentation

## 2019-11-21 DIAGNOSIS — Z79899 Other long term (current) drug therapy: Secondary | ICD-10-CM | POA: Diagnosis not present

## 2019-11-21 DIAGNOSIS — C7800 Secondary malignant neoplasm of unspecified lung: Secondary | ICD-10-CM | POA: Insufficient documentation

## 2019-11-21 DIAGNOSIS — Z7901 Long term (current) use of anticoagulants: Secondary | ICD-10-CM | POA: Insufficient documentation

## 2019-11-21 DIAGNOSIS — Z7952 Long term (current) use of systemic steroids: Secondary | ICD-10-CM | POA: Insufficient documentation

## 2019-11-21 DIAGNOSIS — Z9221 Personal history of antineoplastic chemotherapy: Secondary | ICD-10-CM | POA: Diagnosis not present

## 2019-11-21 DIAGNOSIS — C7931 Secondary malignant neoplasm of brain: Secondary | ICD-10-CM | POA: Diagnosis not present

## 2019-11-21 DIAGNOSIS — Z7189 Other specified counseling: Secondary | ICD-10-CM

## 2019-11-21 DIAGNOSIS — Z86718 Personal history of other venous thrombosis and embolism: Secondary | ICD-10-CM | POA: Diagnosis not present

## 2019-11-21 DIAGNOSIS — C7951 Secondary malignant neoplasm of bone: Secondary | ICD-10-CM | POA: Insufficient documentation

## 2019-11-21 DIAGNOSIS — Z171 Estrogen receptor negative status [ER-]: Secondary | ICD-10-CM

## 2019-11-21 DIAGNOSIS — C50512 Malignant neoplasm of lower-outer quadrant of left female breast: Secondary | ICD-10-CM | POA: Diagnosis present

## 2019-11-21 DIAGNOSIS — Z923 Personal history of irradiation: Secondary | ICD-10-CM | POA: Diagnosis not present

## 2019-11-21 LAB — CMP (CANCER CENTER ONLY)
ALT: 12 U/L (ref 0–44)
AST: 25 U/L (ref 15–41)
Albumin: 3.4 g/dL — ABNORMAL LOW (ref 3.5–5.0)
Alkaline Phosphatase: 120 U/L (ref 38–126)
Anion gap: 7 (ref 5–15)
BUN: 13 mg/dL (ref 6–20)
CO2: 30 mmol/L (ref 22–32)
Calcium: 10.1 mg/dL (ref 8.9–10.3)
Chloride: 103 mmol/L (ref 98–111)
Creatinine: 0.86 mg/dL (ref 0.44–1.00)
GFR, Estimated: >60 mL/min (ref >60)
Glucose, Bld: 87 mg/dL (ref 70–99)
Potassium: 4 mmol/L (ref 3.5–5.1)
Sodium: 140 mmol/L (ref 135–145)
Total Bilirubin: 0.4 mg/dL (ref 0.3–1.2)
Total Protein: 6.8 g/dL (ref 6.5–8.1)

## 2019-11-21 LAB — MAGNESIUM: Magnesium: 1.8 mg/dL (ref 1.7–2.4)

## 2019-11-21 LAB — CBC WITH DIFFERENTIAL (CANCER CENTER ONLY)
Abs Immature Granulocytes: 0.01 10*3/uL (ref 0.00–0.07)
Basophils Absolute: 0 10*3/uL (ref 0.0–0.1)
Basophils Relative: 1 %
Eosinophils Absolute: 0.2 10*3/uL (ref 0.0–0.5)
Eosinophils Relative: 4 %
HCT: 33.9 % — ABNORMAL LOW (ref 36.0–46.0)
Hemoglobin: 11.3 g/dL — ABNORMAL LOW (ref 12.0–15.0)
Immature Granulocytes: 0 %
Lymphocytes Relative: 22 %
Lymphs Abs: 0.9 10*3/uL (ref 0.7–4.0)
MCH: 32.8 pg (ref 26.0–34.0)
MCHC: 33.3 g/dL (ref 30.0–36.0)
MCV: 98.3 fL (ref 80.0–100.0)
Monocytes Absolute: 0.6 10*3/uL (ref 0.1–1.0)
Monocytes Relative: 13 %
Neutro Abs: 2.5 10*3/uL (ref 1.7–7.7)
Neutrophils Relative %: 60 %
Platelet Count: 196 10*3/uL (ref 150–400)
RBC: 3.45 MIL/uL — ABNORMAL LOW (ref 3.87–5.11)
RDW: 15.6 % — ABNORMAL HIGH (ref 11.5–15.5)
WBC Count: 4.2 10*3/uL (ref 4.0–10.5)
nRBC: 0 % (ref 0.0–0.2)

## 2019-11-21 LAB — PHOSPHORUS: Phosphorus: 5.5 mg/dL — ABNORMAL HIGH (ref 2.5–4.6)

## 2019-11-21 MED ORDER — METHADONE HCL 10 MG PO TABS
10.0000 mg | ORAL_TABLET | Freq: Three times a day (TID) | ORAL | 0 refills | Status: DC
Start: 2019-11-21 — End: 2020-01-18

## 2019-11-22 ENCOUNTER — Ambulatory Visit: Payer: 59 | Admitting: Radiation Oncology

## 2019-11-23 ENCOUNTER — Ambulatory Visit: Payer: 59

## 2019-11-26 ENCOUNTER — Ambulatory Visit
Admission: RE | Admit: 2019-11-26 | Discharge: 2019-11-26 | Disposition: A | Discharge status: Home / Self Care | Payer: 59 | Source: Ambulatory Visit | Attending: Radiation Oncology | Admitting: Radiation Oncology

## 2019-11-26 ENCOUNTER — Other Ambulatory Visit: Payer: Self-pay

## 2019-11-26 ENCOUNTER — Other Ambulatory Visit: Payer: Self-pay | Admitting: Hematology and Oncology

## 2019-11-26 ENCOUNTER — Encounter: Payer: Self-pay | Admitting: Radiation Oncology

## 2019-11-26 DIAGNOSIS — C7931 Secondary malignant neoplasm of brain: Secondary | ICD-10-CM | POA: Diagnosis not present

## 2019-11-26 NOTE — Progress Notes (Signed)
[name] rested with [place] for 30 minutes following her [place] treatment.  Patient denies headache, dizziness, nausea, diplopia or ringing in the ears.  She was informed that she would likely sleep for a while this evening considering the treatment as well as the ativan.  She understands to avoid strenuous activity for the next 24 hours and call [phone] with needs.   BP 99/73 (BP Location: Right Leg, Patient Position: Sitting, Cuff Size: Normal)   Pulse 78   Temp 97.8 F (36.6 C)   Resp 18   SpO2 94%    Brandi Tomlinson M. Leonie Green, BSN

## 2019-11-28 ENCOUNTER — Other Ambulatory Visit: Payer: Self-pay | Admitting: Hematology and Oncology

## 2019-11-28 DIAGNOSIS — Z171 Estrogen receptor negative status [ER-]: Secondary | ICD-10-CM

## 2019-11-28 DIAGNOSIS — C50512 Malignant neoplasm of lower-outer quadrant of left female breast: Secondary | ICD-10-CM

## 2019-11-28 NOTE — Procedures (Signed)
  Name: Laura Anthony  MRN: [id number]  Date: 11/26/2019   DOB: 20-Feb-1975  Stereotactic Radiosurgery Operative Note  PRE-OPERATIVE DIAGNOSIS:  Multiple Brain Metastases  POST-OPERATIVE DIAGNOSIS:  Multiple Brain Metastases  PROCEDURE:  Stereotactic Radiosurgery  SURGEON:  Elaina Hoops, MD  NARRATIVE: The patient underwent a radiation treatment planning session in the radiation oncology simulation suite under the care of the radiation oncology physician and physicist.  I participated closely in the radiation treatment planning afterwards. The patient underwent planning CT which was fused to 3T high resolution MRI with 1 mm axial slices.  These images were fused on the planning system.  We contoured the gross target volumes and subsequently expanded this to yield the Planning Target Volume. I actively participated in the planning process.  I helped to define and review the target contours and also the contours of the optic pathway, eyes, brainstem and selected nearby organs at risk.  All the dose constraints for critical structures were reviewed and compared to AAPM Task Group 101.  The prescription dose conformity was reviewed.  I approved the plan electronically.    Accordingly, Laura Anthony was brought to the TrueBeam stereotactic radiation treatment linac and placed in the custom immobilization mask.  The patient was aligned according to the IR fiducial markers with BrainLab Exactrac, then orthogonal x-rays were used in ExacTrac with the 6DOF robotic table and the shifts were made to align the patient  Laura Anthony received stereotactic radiosurgery uneventfully.    Lesions treated:  6   Complex lesions treated:  1 (>3.5 cm, <105mm of optic path, or within the brainstem)   The detailed description of the procedure is recorded in the radiation oncology procedure note.  I was present for the duration of the procedure.  DISPOSITION:  Following delivery, the  patient was transported to nursing in stable condition and monitored for possible acute effects to be discharged to home in stable condition with follow-up in one month.  Elaina Hoops, MD 11/28/2019 4:06 PM

## 2019-12-01 ENCOUNTER — Other Ambulatory Visit: Payer: Self-pay | Admitting: Hematology and Oncology

## 2019-12-01 DIAGNOSIS — Z171 Estrogen receptor negative status [ER-]: Secondary | ICD-10-CM

## 2019-12-01 DIAGNOSIS — C50512 Malignant neoplasm of lower-outer quadrant of left female breast: Secondary | ICD-10-CM

## 2019-12-03 ENCOUNTER — Telehealth: Payer: Self-pay | Admitting: Radiation Oncology

## 2019-12-03 ENCOUNTER — Ambulatory Visit (HOSPITAL_COMMUNITY)
Admission: RE | Admit: 2019-12-03 | Discharge: 2019-12-03 | Disposition: A | Discharge status: Home / Self Care | Payer: 59 | Source: Ambulatory Visit | Attending: Hematology and Oncology | Admitting: Hematology and Oncology

## 2019-12-03 ENCOUNTER — Inpatient Hospital Stay: Payer: 59 | Attending: Hematology and Oncology | Admitting: Medical

## 2019-12-03 ENCOUNTER — Telehealth: Payer: Self-pay | Admitting: Pharmacist

## 2019-12-03 ENCOUNTER — Other Ambulatory Visit: Payer: Self-pay | Admitting: Hematology and Oncology

## 2019-12-03 ENCOUNTER — Other Ambulatory Visit: Payer: Self-pay | Admitting: *Deleted

## 2019-12-03 ENCOUNTER — Other Ambulatory Visit: Payer: Self-pay

## 2019-12-03 VITALS — BP 112/72 | HR 80 | Temp 98.7°F | Resp 18 | Ht 67.0 in | Wt 166.1 lb

## 2019-12-03 DIAGNOSIS — Z171 Estrogen receptor negative status [ER-]: Secondary | ICD-10-CM

## 2019-12-03 DIAGNOSIS — T451X5A Adverse effect of antineoplastic and immunosuppressive drugs, initial encounter: Secondary | ICD-10-CM | POA: Insufficient documentation

## 2019-12-03 DIAGNOSIS — C50512 Malignant neoplasm of lower-outer quadrant of left female breast: Secondary | ICD-10-CM

## 2019-12-03 DIAGNOSIS — R5383 Other fatigue: Secondary | ICD-10-CM | POA: Insufficient documentation

## 2019-12-03 DIAGNOSIS — L271 Localized skin eruption due to drugs and medicaments taken internally: Secondary | ICD-10-CM | POA: Diagnosis not present

## 2019-12-03 DIAGNOSIS — C7951 Secondary malignant neoplasm of bone: Secondary | ICD-10-CM

## 2019-12-03 DIAGNOSIS — J90 Pleural effusion, not elsewhere classified: Secondary | ICD-10-CM | POA: Insufficient documentation

## 2019-12-03 DIAGNOSIS — J189 Pneumonia, unspecified organism: Secondary | ICD-10-CM | POA: Diagnosis not present

## 2019-12-03 DIAGNOSIS — R0789 Other chest pain: Secondary | ICD-10-CM | POA: Diagnosis not present

## 2019-12-03 DIAGNOSIS — Z888 Allergy status to other drugs, medicaments and biological substances status: Secondary | ICD-10-CM | POA: Insufficient documentation

## 2019-12-03 DIAGNOSIS — C7800 Secondary malignant neoplasm of unspecified lung: Secondary | ICD-10-CM | POA: Insufficient documentation

## 2019-12-03 DIAGNOSIS — Z8616 Personal history of COVID-19: Secondary | ICD-10-CM | POA: Diagnosis not present

## 2019-12-03 DIAGNOSIS — C7931 Secondary malignant neoplasm of brain: Secondary | ICD-10-CM | POA: Diagnosis not present

## 2019-12-03 DIAGNOSIS — M879 Osteonecrosis, unspecified: Secondary | ICD-10-CM | POA: Diagnosis not present

## 2019-12-03 DIAGNOSIS — Z7901 Long term (current) use of anticoagulants: Secondary | ICD-10-CM | POA: Diagnosis not present

## 2019-12-03 DIAGNOSIS — Z79899 Other long term (current) drug therapy: Secondary | ICD-10-CM | POA: Diagnosis not present

## 2019-12-03 MED ORDER — CAPECITABINE 500 MG PO TABS: ORAL_TABLET | ORAL | 0 refills | Status: DC

## 2019-12-03 MED ORDER — AMOXICILLIN-POT CLAVULANATE 875-125 MG PO TABS: 1.0000 | ORAL_TABLET | Freq: Two times a day (BID) | ORAL | 0 refills | Status: DC

## 2019-12-03 NOTE — Telephone Encounter (Addendum)
Oral Chemotherapy Pharmacist Encounter   Spoke with patient today to follow up regarding patient's oral chemotherapy medication: Xeloda (capecitabine)  Original Start date of oral chemotherapy: 10/29/19  Patient reports that she is currently taking Xeloda 2 tablets (1000 mg total) by mouth in AM and 3 tablets (1500 mg total) by mouth in PM after a meal for 14 days on 7 days off.   Today is the first day of her off week, and she will start her next cycle of Xeloda on 12/10/19  Patient stated she is currently tolerating the current dose of Xeloda well. She states she does start to notice some discomfort with HFS at the end of her 14 days on, but it resolves on its own during her off week.   Due to decrease in dose, patient reported that she has about 14 tablets on hand of Xeloda.  Updated Xeloda prescription sent to [facility] for patient's next cycle.  Patient knows to call the office with questions or concerns.  Leron Croak, PharmD, BCPS Hematology/Oncology Clinical Pharmacist [facility] [phone] 12/03/2019 10:03 AM

## 2019-12-03 NOTE — Telephone Encounter (Signed)
Radiation Oncology         [phone] ________________________________  Outpatient Follow Up - Conducted via telephone due to current COVID-19 concerns for limiting patient exposure  I spoke with the patient to conduct this consult visit via telephone to spare the patient unnecessary potential exposure in the healthcare setting during the current COVID-19 pandemic. The patient was notified in advance and was offered a [place] meeting to allow for face to face communication but unfortunately reported that they did not have the appropriate resources/technology to support such a visit and instead preferred to proceed with a telephone visit.  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  12/13/2018-01/07/2019:  The sternum, pelvis and sacrum, T and C spine were treated to 37.5 Gy in 15 fractions   12/19/18 SRS Treatment: The T1 vertebral body was treated with stereotactic radiosurgery (SRS)[facility]18 Gy in 1 fraction  08/22/2018-09/05/2018:  30 Gy in 10 fractions to the right 11th rib  08/08/2018-08/21/2018:  30 Gy in 10 fractions to the right elbow  05/08/2017 - 06/21/2017: The patient initially received a dose of 50.4 Gy in 28 fractions to the leftchest wall,supraclavicular region, and posterior axillary region. This was delivered using a 3-D conformal, 4 field technique. The  patient then received a boost to the mastectomy scar. This delivered an additional 10 Gy in 5 fractions using an en face electron field. The total dose was 60.4 Gy.  Narrative:  Laura Anthony pleasant 44 y.1 female with a history of recurrent metastatic breast cancer involving the lungs, bone, and brain.  She is well-known to our service and has had multiple courses of radiotherapy with her most recent being SRS to the brain to 5 subcentimeter lesions. She tolerated this well without significant toxicities. She continues on Xeloda with Dr. GudenLindi Adiethis is her week of of her course. She reports she has had progressive pain in her right collar bone region and was seen in symptom management clinic today. A 2 view chest x-ray today revealed a left mid lung infiltrate possibly consistent with pneumonia and a small left effusion, with known nodular densities in her chest. A lytic lesion in the right medial clavicular head was seen as well. Given these findings and her pain, her breast navigator reached out to see if we could consider further radiotherapy. She was also started on Augmentin for the possible pneumonia. As a note she is fully vaccinated but did have covid 19 in June 2021. She has tested negative since that infection. She plans to retest again today to be sure.   On review of systems, the patient reports that she is doing well since completing her brain radiation but is having progressive pain in her right collar bone region into her right shoulder. She continues her current regimen of pain medication including Methadone and prn oxycodone. She denies any chest pain, cough,  fevers, chills, night sweats. Her T max was not above 99 degrees F. She does have shortness of breath when taking deep breaths but has not noticed progressive symptoms or more acute changes than from baseline. No other complaints are noted.  PAST MEDICAL HISTORY:  Past Medical History:  Diagnosis Date  . ADD  (attention deficit disorder)   . Anemia   . Anxiety   . Breast cancer, left breast ([place])    S/P mastectomy 03/27/2017  . DVT (deep venous thrombosis) ([place]) 2017   calf left - probably due to [facility] pills-took eliquis x3 mos, nonthing now  . High cholesterol   . Impingement syndrome of right shoulder 07/2013  . Migraine    "usually 1/month" (03/28/2017)  . PONV (postoperative nausea and vomiting)   . Right bicipital tenosynovitis 07/2013  . Rotator cuff impingement syndrome of right shoulder 07/12/2013  . Seizures ([place])    x 1 as a child - was never on anticonvulsants (03/28/2017)    PAST SURGICAL HISTORY: Past Surgical History:  Procedure Laterality Date  . ADENOIDECTOMY  1981  . ANKLE ARTHROSCOPY Right   . BREAST BIOPSY Left 10/2016  . KNEE ARTHROSCOPY Right   . KNEE ARTHROSCOPY W/ ACL RECONSTRUCTION Left   . LIPOSUCTION WITH LIPOFILLING Bilateral 01/05/2018   Procedure: LIPOFILLING FROM ABDOMEN TO BILATERAL CHEST;  Surgeon: Irene Limbo, MD;  Location: [place];  Service: Plastics;  Laterality: Bilateral;  . MASTECTOMY Left 03/27/2017   NIPPLE SPARING MASTECTOMY WITH RADIOACTIVE SEED TARGETED LYMPH NODE EXCISION AND LEFT AXILLARY SENTINEL LYMPH NODE BIOPSY  . MASTECTOMY Right 03/27/2017   RIGHT PROPHYLACTIC NIPPLE SPARING MASTECTOMY  . NIPPLE SPARING MASTECTOMY Right 03/27/2017   Procedure: RIGHT PROPHYLACTIC NIPPLE SPARING MASTECTOMY;  Surgeon: Rolm Bookbinder, MD;  Location: [place];  Service: General;  Laterality: Right;  . PORT-A-CATH REMOVAL Right 01/05/2018   Procedure: REMOVAL RIGHT CHEST PORT;  Surgeon: Irene Limbo, MD;  Location: [place];  Service: Plastics;  Laterality: Right;  . PORTACATH PLACEMENT N/A 11/01/2016   Procedure: INSERTION PORT-A-CATH WITH [place];  Surgeon: Rolm Bookbinder, MD;  Location: [place];  Service: General;  Laterality: N/A;  . PORTACATH PLACEMENT N/A 08/23/2018   Procedure: INSERTION  PORT-A-CATH WITH ULTRASOUND;  Surgeon: Rolm Bookbinder, MD;  Location: [place];  Service: General;  Laterality: N/A;  . RADIOACTIVE SEED GUIDED AXILLARY SENTINEL LYMPH NODE Left 03/27/2017   Procedure: LEFT NIPPLE SPARING MASTECTOMY WITH RADIOACTIVE SEED TARGETED LYMPH NODE EXCISION AND LEFT AXILLARY SENTINEL LYMPH NODE BIOPSY;  Surgeon: Rolm Bookbinder, MD;  Location: [place];  Service: General;  Laterality: Left;  REQUESTS RNFA  . RECONSTRUCTION BREAST IMMEDIATE / DELAYED W/ TISSUE EXPANDER Bilateral 03/27/2017   BILATERAL BREAST RECONSTRUCTION WITH PLACEMENT OF TISSUE EXPANDER AND ALLODERM  . REMOVAL OF BILATERAL TISSUE EXPANDERS WITH PLACEMENT OF BILATERAL BREAST IMPLANTS Bilateral 01/05/2018   Procedure: REMOVAL OF BILATERAL TISSUE EXPANDERS WITH PLACEMENT OF BILATERAL BREAST IMPLANTS;  Surgeon: Irene Limbo, MD;  Location: [place];  Service: Plastics;  Laterality: Bilateral;  . SEPTOPLASTY WITH ETHMOIDECTOMY, AND MAXILLARY ANTROSTOMY  10/29/2010   bilat. max. antrostomy with left max. stripping; left ant. ethmoidectomy; right total ethmoidectomy; sphenoidotomy  . SHOULDER ARTHROSCOPY WITH SUBACROMIAL DECOMPRESSION AND BICEP TENDON REPAIR Right 07/12/2013   Procedure: RIGHT SHOULDER ARTHROSCOPY DEBRIDEMENT EXTENSIVE DECOMPRESSION SUBACROMIAL PARTIAL ACROMIOPLASTY;  Surgeon: Johnny Bridge, MD;  Location: [place];  Service: Orthopedics;  Laterality: Right;  . WRIST ARTHROSCOPY  01/17/2012  Procedure: ARTHROSCOPY WRIST; right wrist Surgeon: Tennis Must, MD;  Location: [place];  Service: Orthopedics;  Laterality: Right;  RIGHT WRIST ARTHROSCOPY WITH TRIANGULAR FIBROCARTILAGE COMPLEX REPAIR AND DEBRIDEMENT     PAST SOCIAL HISTORY:  Social History   Socioeconomic History  . Marital status: Married    Spouse name: Not on file  . Number of children: Not on file  . Years of education: Not on file    . Highest education level: Not on file  Occupational History  . Not on file  Tobacco Use  . Smoking status: Never Smoker  . Smokeless tobacco: Never Used  Vaping Use  . Vaping Use: Never used  Substance and Sexual Activity  . Alcohol use: Yes    Comment: Drinks very rare  . Drug use: No  . Sexual activity: Yes    Birth control/protection: Surgical    Comment: husband has had a vasectomy  Other Topics Concern  . Not on file  Social History Narrative  . Not on file   Social Determinants of Health   Financial Resource Strain:   . Difficulty of Paying Living Expenses: Not on file  Food Insecurity:   . Worried About Charity fundraiser in the Last Year: Not on file  . Ran Out of Food in the Last Year: Not on file  Transportation Needs:   . Lack of Transportation (Medical): Not on file  . Lack of Transportation (Non-Medical): Not on file  Physical Activity:   . Days of Exercise per Week: Not on file  . Minutes of Exercise per Session: Not on file  Stress:   . Feeling of Stress : Not on file  Social Connections:   . Frequency of Communication with Friends and Family: Not on file  . Frequency of Social Gatherings with Friends and Family: Not on file  . Attends Religious Services: Not on file  . Active Member of Clubs or Organizations: Not on file  . Attends Archivist Meetings: Not on file  . Marital Status: Not on file  Intimate Partner Violence:   . Fear of Current or Ex-Partner: Not on file  . Emotionally Abused: Not on file  . Physically Abused: Not on file  . Sexually Abused: Not on file  The patient is married and lives in [place]. She has teenage children.  PAST FAMILY HISTORY: Family History  Problem Relation Age of Onset  . Breast cancer Mother 55       triple negative  . Leukemia Father   . Lung cancer Father   . Heart attack Maternal Uncle   . Prostate cancer Paternal Uncle   . COPD Paternal Grandmother   . Heart disease Paternal  Grandfather   . Prostate cancer Paternal Uncle   . Leukemia Cousin     MEDICATIONS  Current Outpatient Medications  Medication Sig Dispense Refill  . amoxicillin-clavulanate (AUGMENTIN) 875-125 MG tablet Take 1 tablet by mouth 2 (two) times daily. 14 tablet 0  . capecitabine (XELODA) 500 MG tablet Take 3 tablets (1,500 mg total) by mouth 2 (two) times daily after a meal. Take 14 days on, 7 days off, repeat every 21 days. 84 tablet 3  . ELIQUIS 5 MG TABS tablet Take 1 tablet (5 mg total) by mouth 2 (two) times daily. 60 tablet 3  . gabapentin (NEURONTIN) 300 MG capsule TAKE 2 CAPSULES BY MOUTH AT BEDTIME 60 capsule 5  . levothyroxine (SYNTHROID) 75 MCG tablet Take 1 tablet (75 mcg  total) by mouth daily before breakfast. 30 tablet 6  . lidocaine-prilocaine (EMLA) cream Apply topically daily.    Marland Kitchen LORazepam (ATIVAN) 0.5 MG tablet Take 1 tablet (0.5 mg total) by mouth every 6 (six) hours as needed for anxiety. 45 tablet 1  . methadone (DOLOPHINE) 10 MG tablet Take 1 tablet (10 mg total) by mouth every 8 (eight) hours. 90 tablet 0  . naloxone (NARCAN) 4 MG/0.1ML LIQD nasal spray kit Spray once in one nostril prn overdose, may repeat x 1 (Patient not taking: Reported on 11/14/2019) 1 each 1  . oxyCODONE-acetaminophen (PERCOCET) 10-325 MG tablet Take 1 tablet by mouth every 4 (four) hours as needed for pain. 120 tablet 0  . prochlorperazine (COMPAZINE) 10 MG tablet Take 1 tablet (10 mg total) by mouth every 6 (six) hours as needed (Nausea or vomiting). 30 tablet 1  . venlafaxine XR (EFFEXOR-XR) 150 MG 24 hr capsule TAKE 1 CAPSULE (150 MG TOTAL) BY MOUTH DAILY WITH BREAKFAST. 90 capsule 3  . zolpidem (AMBIEN CR) 12.5 MG CR tablet Take 1 tablet (12.5 mg total) by mouth at bedtime. 30 tablet 3   No current facility-administered medications for this visit.    ALLERGIES:  Allergies  Allergen Reactions  . Denosumab Other (See Comments)    Osteonecrosis of the jaw  . Statins Other (See Comments)     Leg pain  . Sumatriptan Other (See Comments)    Numbness to face   . Tape Rash    Rash from dressing over port-a-cath    Physcial Exam: Unable to assess due to encounter type.  Impression/Plan: 1. 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 Her scans were reviewed by Dr. [name] and given her symptoms of pain in her right clavicular head, he would offer a course of palliative radiotherapy.  We discussed the risks, benefits, short, and long term effects of radiotherapy, and the delivery and logistics of radiotherapy. Dr. [name] will offer 5 fractions of treatment with her ongoing Xeloda on hold. Dr. Lindi Adie is in agreement with holding an additional day to complete this course and resuming Xeloda on 12/11/19 after completion. She will simulate tomorrow at 8 am at which time she will sign written consent to proceed. She will begin treatment Thursday morning as well. We will plan to resume her surveillance in the brain and spine conference with her next MRI in January 2022.  2. Goals of care. The patient has been interested in meeting with palliative care and we will coordinate for her to meet with Dr. [name] as well.  3. Left pulmonary infiltrate. The patient is asymptomatic but will start augmentin under medical oncology's direction. She also plans to proceed with at home rapid covid testing to rule out a new covid infection.      Carola Rhine, PAC

## 2019-12-03 NOTE — Progress Notes (Signed)
Received call from pt with complaint of severe right clavicle pain.  Pt states pain is 10/10 excruciating when she touches that area.  Per MD pt needing to receive chest X-ray and f/u with Sandi Mealy, PA in [facility] today for further evaluation.  Apt scheduled and pt verbalized understanding of apt date and time.

## 2019-12-04 ENCOUNTER — Ambulatory Visit
Admission: RE | Admit: 2019-12-04 | Discharge: 2019-12-04 | Disposition: A | Discharge status: Home / Self Care | Payer: 59 | Source: Ambulatory Visit | Attending: Radiation Oncology | Admitting: Radiation Oncology

## 2019-12-04 DIAGNOSIS — C7931 Secondary malignant neoplasm of brain: Secondary | ICD-10-CM | POA: Diagnosis not present

## 2019-12-04 DIAGNOSIS — Z510 Encounter for antineoplastic radiation therapy: Secondary | ICD-10-CM | POA: Insufficient documentation

## 2019-12-04 NOTE — Progress Notes (Signed)
Symptoms Management Clinic Progress Note   Laura Anthony [id number] 1975/03/18 44 y.o.  Laura Anthony is managed by Dr. Nicholas Lose  Actively treated with chemotherapy/immunotherapy/hormonal therapy: yes  Current therapy: Xeloda  Next scheduled appointment with provider: 12/19/2019  Assessment: Plan:    Other chest pain  Pneumonia of left upper lobe due to infectious organism - Plan: amoxicillin-clavulanate (AUGMENTIN) 875-125 MG tablet  Malignant neoplasm of lower-outer quadrant of left breast of female, estrogen receptor negative ([place])  Bone metastases ([place])   Chest pain: The chest x-ray from today shows that the patient has a lytic lesion of her right medial clavicle at the site where she is currently experiencing pain.  Dr. Lisbeth Anthony has been contacted and has been asked to see the patient for palliative radiation.  She will be seen tomorrow.  Pneumonia of the left lower lobe: An incidental finding on the patient's x-ray today was a possible pneumonia on the left lower lobe.  She is asymptomatic.  Despite this, she was given a prescription for Augmentin 875-125 p.o. twice daily x7 days.  She was told to eat yogurt while taking Augmentin.  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 Nicholas Lose and is currently being treated with Xeloda.  She is scheduled to be seen in follow-up next on 12/19/2019.  Please see After Visit Summary for patient specific instructions.  Future Appointments  Date Time Provider [place]  12/05/2019  1:00 PM Kyung Rudd, MD [facility] None  12/06/2019 11:45 AM [facility] LINAC 4 [facility] None  12/06/2019 12:15 PM [facility] PALLIATIVE CARE [facility] None  12/09/2019 12:45 PM [facility] LINAC 4 [facility] None  12/10/2019 12:45 PM [facility] LINAC 3 [facility] None  12/11/2019  3:15 PM [facility] LINAC 4 [facility] None   12/19/2019  2:30 PM [facility] LAB [facility] None  12/19/2019  3:00 PM Nicholas Lose, MD [facility] None    No orders of the defined types were placed in this encounter.      Subjective:   Patient ID:  Laura Anthony is a 44 y.o. (DOB 03-09-1975) female.  Chief Complaint: No chief complaint on file.   HPI Laura Anthony  is a 44 y.o. 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  She is followed by Dr. Nicholas Lose and is currently treated with Xeloda.  She presents to the clinic today with a report of chest pain.  Her chest pain is localized over the medial head of the left clavicle.  She was referred for a chest x-ray earlier today that returned showing:  FINDINGS: PowerPort catheter in stable position.  Mediastinum hilar structures are unremarkable.  Heart size stable.  Pulmonary nodular densities again noted consistent known metastatic disease. Infiltrate noted in the left mid lung suggesting the possibility pneumonia.  Small left pleural effusion.  No pneumothorax.  Mild elevation left hemidiaphragm again noted.  Lytic bony lesions best identified by prior CT.  Recent CT demonstrates a lytic lesion is present in the right medial clavicular head.  This may account for the patient's pain.  IMPRESSION: 1. Infiltrate in the left mid lung suggesting the possibility of pneumonia.  Small left pleural effusion. 2. Pulmonary nodular densities again noted consistent with known metastatic disease. 3. Lytic bony lesions best identified by prior CT. Recent CT demonstrates a lytic lesion in the right medial clavicular head. This may account  for the patient's pain.  She denies fevers, chills, sweats, or cough.  Medications: I have reviewed the patient's current medications.  Allergies:  Allergies  Allergen Reactions  . Denosumab Other (See Comments)    Osteonecrosis of the jaw  . Statins  Other (See Comments)    Leg pain  . Sumatriptan Other (See Comments)    Numbness to face   . Tape Rash    Rash from dressing over port-a-cath     Past Medical History:  Diagnosis Date  . ADD (attention deficit disorder)   . Anemia   . Anxiety   . Breast cancer, left breast ([place])    S/P mastectomy 03/27/2017  . DVT (deep venous thrombosis) ([place]) 2017   calf left - probably due to [employer] pills-took eliquis x3 mos, nonthing now  . High cholesterol   . Impingement syndrome of right shoulder 07/2013  . Migraine    "usually 1/month" (03/28/2017)  . PONV (postoperative nausea and vomiting)   . Right bicipital tenosynovitis 07/2013  . Rotator cuff impingement syndrome of right shoulder 07/12/2013  . Seizures ([place])    x 1 as a child - was never on anticonvulsants (03/28/2017)    Past Surgical History:  Procedure Laterality Date  . ADENOIDECTOMY  1981  . ANKLE ARTHROSCOPY Right   . BREAST BIOPSY Left 10/2016  . KNEE ARTHROSCOPY Right   . KNEE ARTHROSCOPY W/ ACL RECONSTRUCTION Left   . LIPOSUCTION WITH LIPOFILLING Bilateral 01/05/2018   Procedure: LIPOFILLING FROM ABDOMEN TO BILATERAL CHEST;  Surgeon: Irene Limbo, MD;  Location: [place];  Service: Plastics;  Laterality: Bilateral;  . MASTECTOMY Left 03/27/2017   NIPPLE SPARING MASTECTOMY WITH RADIOACTIVE SEED TARGETED LYMPH NODE EXCISION AND LEFT AXILLARY SENTINEL LYMPH NODE BIOPSY  . MASTECTOMY Right 03/27/2017   RIGHT PROPHYLACTIC NIPPLE SPARING MASTECTOMY  . NIPPLE SPARING MASTECTOMY Right 03/27/2017   Procedure: RIGHT PROPHYLACTIC NIPPLE SPARING MASTECTOMY;  Surgeon: Rolm Bookbinder, MD;  Location: [place];  Service: General;  Laterality: Right;  . PORT-A-CATH REMOVAL Right 01/05/2018   Procedure: REMOVAL RIGHT CHEST PORT;  Surgeon: Irene Limbo, MD;  Location: [place];  Service: Plastics;  Laterality: Right;  . PORTACATH PLACEMENT N/A 11/01/2016   Procedure: INSERTION  PORT-A-CATH WITH [place];  Surgeon: Rolm Bookbinder, MD;  Location: [place];  Service: General;  Laterality: N/A;  . PORTACATH PLACEMENT N/A 08/23/2018   Procedure: INSERTION PORT-A-CATH WITH ULTRASOUND;  Surgeon: Rolm Bookbinder, MD;  Location: [place];  Service: General;  Laterality: N/A;  . RADIOACTIVE SEED GUIDED AXILLARY SENTINEL LYMPH NODE Left 03/27/2017   Procedure: LEFT NIPPLE SPARING MASTECTOMY WITH RADIOACTIVE SEED TARGETED LYMPH NODE EXCISION AND LEFT AXILLARY SENTINEL LYMPH NODE BIOPSY;  Surgeon: Rolm Bookbinder, MD;  Location: [place];  Service: General;  Laterality: Left;  REQUESTS RNFA  . RECONSTRUCTION BREAST IMMEDIATE / DELAYED W/ TISSUE EXPANDER Bilateral 03/27/2017   BILATERAL BREAST RECONSTRUCTION WITH PLACEMENT OF TISSUE EXPANDER AND ALLODERM  . REMOVAL OF BILATERAL TISSUE EXPANDERS WITH PLACEMENT OF BILATERAL BREAST IMPLANTS Bilateral 01/05/2018   Procedure: REMOVAL OF BILATERAL TISSUE EXPANDERS WITH PLACEMENT OF BILATERAL BREAST IMPLANTS;  Surgeon: Irene Limbo, MD;  Location: [place];  Service: Plastics;  Laterality: Bilateral;  . SEPTOPLASTY WITH ETHMOIDECTOMY, AND MAXILLARY ANTROSTOMY  10/29/2010   bilat. max. antrostomy with left max. stripping; left ant. ethmoidectomy; right total ethmoidectomy; sphenoidotomy  . SHOULDER ARTHROSCOPY WITH SUBACROMIAL DECOMPRESSION AND BICEP TENDON REPAIR Right 07/12/2013   Procedure: RIGHT  SHOULDER ARTHROSCOPY DEBRIDEMENT EXTENSIVE DECOMPRESSION SUBACROMIAL PARTIAL ACROMIOPLASTY;  Surgeon: Johnny Bridge, MD;  Location: [place];  Service: Orthopedics;  Laterality: Right;  . WRIST ARTHROSCOPY  01/17/2012   Procedure: ARTHROSCOPY WRIST; right wrist Surgeon: Tennis Must, MD;  Location: [place];  Service: Orthopedics;  Laterality: Right;  RIGHT WRIST ARTHROSCOPY WITH TRIANGULAR FIBROCARTILAGE COMPLEX REPAIR AND DEBRIDEMENT     Family History    Problem Relation Age of Onset  . Breast cancer Mother 15       triple negative  . Leukemia Father   . Lung cancer Father   . Heart attack Maternal Uncle   . Prostate cancer Paternal Uncle   . COPD Paternal Grandmother   . Heart disease Paternal Grandfather   . Prostate cancer Paternal Uncle   . Leukemia Cousin     Social History   Socioeconomic History  . Marital status: Married    Spouse name: Not on file  . Number of children: Not on file  . Years of education: Not on file  . Highest education level: Not on file  Occupational History  . Not on file  Tobacco Use  . Smoking status: Never Smoker  . Smokeless tobacco: Never Used  Vaping Use  . Vaping Use: Never used  Substance and Sexual Activity  . Alcohol use: Yes    Comment: Drinks very rare  . Drug use: No  . Sexual activity: Yes    Birth control/protection: Surgical    Comment: husband has had a vasectomy  Other Topics Concern  . Not on file  Social History Narrative  . Not on file   Social Determinants of Health   Financial Resource Strain:   . Difficulty of Paying Living Expenses: Not on file  Food Insecurity:   . Worried About Charity fundraiser in the Last Year: Not on file  . Ran Out of Food in the Last Year: Not on file  Transportation Needs:   . Lack of Transportation (Medical): Not on file  . Lack of Transportation (Non-Medical): Not on file  Physical Activity:   . Days of Exercise per Week: Not on file  . Minutes of Exercise per Session: Not on file  Stress:   . Feeling of Stress : Not on file  Social Connections:   . Frequency of Communication with Friends and Family: Not on file  . Frequency of Social Gatherings with Friends and Family: Not on file  . Attends Religious Services: Not on file  . Active Member of Clubs or Organizations: Not on file  . Attends Archivist Meetings: Not on file  . Marital Status: Not on file  Intimate Partner Violence:   . Fear of Current or  Ex-Partner: Not on file  . Emotionally Abused: Not on file  . Physically Abused: Not on file  . Sexually Abused: Not on file    Past Medical History, Surgical history, Social history, and Family history were reviewed and updated as appropriate.   Please see review of systems for further details on the patient's review from today.   Review of Systems:  Review of Systems  Constitutional: Negative for chills, diaphoresis and fever.  HENT: Negative for trouble swallowing.   Respiratory: Negative for cough, choking, chest tightness, shortness of breath, wheezing and stridor.   Cardiovascular: Positive for chest pain. Negative for palpitations.    Objective:   Physical Exam:  BP 112/72   Pulse 80   Temp 98.7 F (  37.1 C) (Tympanic)   Resp 18   Ht 5\' 7"  (1.702 m)   Wt 166 lb 1.6 oz (75.3 kg)   SpO2 97%   BMI 26.01 kg/m  ECOG: 1  Physical Exam Constitutional:      General: She is not in acute distress.    Appearance: She is not diaphoretic.  HENT:     Head: Normocephalic and atraumatic.  Eyes:     General: No scleral icterus.       Right eye: No discharge.        Left eye: No discharge.     Conjunctiva/sclera: Conjunctivae normal.  Cardiovascular:     Rate and Rhythm: Normal rate and regular rhythm.     Heart sounds: Normal heart sounds. No murmur heard.  No friction rub. No gallop.   Pulmonary:     Effort: Pulmonary effort is normal. No respiratory distress.     Breath sounds: Normal breath sounds. No wheezing or rales.  Musculoskeletal:        General: Tenderness present.       Arms:  Skin:    General: Skin is warm and dry.     Findings: No erythema or rash.  Neurological:     Mental Status: She is alert.     Coordination: Coordination normal.     Gait: Gait normal.  Psychiatric:        Mood and Affect: Mood normal.        Behavior: Behavior normal.        Thought Content: Thought content normal.        Judgment: Judgment normal.     Lab Review:       Component Value Date/Time   NA 140 11/21/2019 1024   NA 138 01/27/2017 0802   K 4.0 11/21/2019 1024   K 4.1 01/27/2017 0802   CL 103 11/21/2019 1024   CO2 30 11/21/2019 1024   CO2 26 01/27/2017 0802   GLUCOSE 87 11/21/2019 1024   GLUCOSE 90 01/27/2017 0802   BUN 13 11/21/2019 1024   BUN 14.0 01/27/2017 0802   CREATININE 0.86 11/21/2019 1024   CREATININE 0.7 01/27/2017 0802   CALCIUM 10.1 11/21/2019 1024   CALCIUM 8.6 01/27/2017 0802   PROT 6.8 11/21/2019 1024   PROT 6.5 01/27/2017 0802   ALBUMIN 3.4 (L) 11/21/2019 1024   ALBUMIN 3.5 01/27/2017 0802   AST 25 11/21/2019 1024   AST 22 01/27/2017 0802   ALT 12 11/21/2019 1024   ALT 23 01/27/2017 0802   ALKPHOS 120 11/21/2019 1024   ALKPHOS 85 01/27/2017 0802   BILITOT 0.4 11/21/2019 1024   BILITOT 0.23 01/27/2017 0802   GFRNONAA >60 11/21/2019 1024   GFRAA >60 10/24/2019 0805       Component Value Date/Time   WBC 4.2 11/21/2019 1024   WBC 2.6 (L) 07/25/2019 0519   RBC 3.45 (L) 11/21/2019 1024   HGB 11.3 (L) 11/21/2019 1024   HGB 11.1 (L) 01/27/2017 0802   HCT 33.9 (L) 11/21/2019 1024   HCT 33.4 (L) 01/27/2017 0802   PLT 196 11/21/2019 1024   PLT 170 01/27/2017 0802   MCV 98.3 11/21/2019 1024   MCV 99.0 01/27/2017 0802   MCH 32.8 11/21/2019 1024   MCHC 33.3 11/21/2019 1024   RDW 15.6 (H) 11/21/2019 1024   RDW 18.2 (H) 01/27/2017 0802   LYMPHSABS 0.9 11/21/2019 1024   LYMPHSABS 1.7 01/27/2017 0802   MONOABS 0.6 11/21/2019 1024   MONOABS 0.5 01/27/2017 0802  EOSABS 0.2 11/21/2019 1024   EOSABS 0.0 01/27/2017 0802   BASOSABS 0.0 11/21/2019 1024   BASOSABS 0.0 01/27/2017 0802   -------------------------------  Imaging from last 24 hours (if applicable):  Radiology interpretation: DG Chest 2 View  Result Date: 12/03/2019 CLINICAL DATA:  Right clavicular pain. Right upper sternal pain. Metastatic breast cancer. No known injury. EXAM: CHEST - 2 VIEW COMPARISON:  CT 10/22/2019 FINDINGS: PowerPort catheter in  stable position. Mediastinum hilar structures are unremarkable. Heart size stable. Pulmonary nodular densities again noted consistent known metastatic disease. Infiltrate noted in the left mid lung suggesting the possibility pneumonia. Small left pleural effusion. No pneumothorax. Mild elevation left hemidiaphragm again noted. Lytic bony lesions best identified by prior CT. Recent CT demonstrates a lytic lesion is present in the right medial clavicular head. This may account for the patient's pain. IMPRESSION: 1. Infiltrate in the left mid lung suggesting the possibility of pneumonia. Small left pleural effusion. 2. Pulmonary nodular densities again noted consistent with known metastatic disease. 3. Lytic bony lesions best identified by prior CT. Recent CT demonstrates a lytic lesion in the right medial clavicular head. This may account for the patient's pain. Electronically Signed   By: Marcello Moores  Register   On: 12/03/2019 12:09   MR Brain W Wo Contrast  Addendum Date: 11/19/2019   ADDENDUM REPORT: 11/19/2019 08:44 ADDENDUM: On further review, the three new lesions described in impression #2 were less conspicuous but were present on the prior MRI from 09/30/2019 (see series 38, images 62, 81 and 65 of the prior study). Updated findings discussed with Worthy Flank PA on 11/19/19 at 8:40 AM. Electronically Signed   By: Margaretha Sheffield MD   On: 11/19/2019 08:44   Addendum Date: 11/14/2019   ADDENDUM REPORT: 11/14/2019 11:16 ADDENDUM: Contrast was administered via a port which was accessed by a registered nurse. Electronically Signed   By: Margaretha Sheffield MD   On: 11/14/2019 11:16   Result Date: 11/19/2019 CLINICAL DATA:  Short interval follow-up for areas of enhancement seen on recent MRI EXAM: MRI HEAD WITHOUT AND WITH CONTRAST TECHNIQUE: Multiplanar, multiecho pulse sequences of the brain and surrounding structures were obtained without and with intravenous contrast. CONTRAST:  43mL MULTIHANCE  GADOBENATE DIMEGLUMINE 529 MG/ML IV SOLN COMPARISON:  09/30/2018 FINDINGS: Brain: Post-contrast imaging is degraded by artifact. Similar versus minimally increased size of the 2 punctate enhancing lesions in the right paramedian pons in the superior right cerebellum (series 10, image 52). There are three new subcentimeter nodular areas of enhancement within the right temporal lobe (series 10, images 59, 60 and 77). Punctate focus of enhancement in the left precentral gyrus appears similar to prior. No substantial mass effect. No midline shift. Basal cisterns are patent. Left parieto-occipital developmental venous anomaly. No evidence of acute hemorrhage. No hydrocephalus. Mild scattered white matter T2/FLAIR hyperintensities, likely the sequela of chronic microvascular ischemic disease. Vascular: Proximal major arterial flow voids are maintained at the skull base. Skull and upper cervical spine: Heterogeneous and T1 hypointense signal within the C3 vertebral body Sinuses/Orbits: Mild ethmoid air cell mucosal thickening. Otherwise, sinuses are clear. No acute orbital abnormality. Other: Small right mastoid effusion. IMPRESSION: 1. Similar versus minimally increased size of the two punctate enhancing lesions in the right paramedian pons in the superior right cerebellum. 2. There are three new subcentimeter areas of enhancement within the right temporal lobe, which are detailed above and compatible new metastases. 3. Treated punctate focus of enhancement in the left precentral gyrus appears similar to  prior. 4. Similar heterogeneous and T1 hypointense marrow signal involving C3 vertebral body, concerning for osseous metastatic lesion. Electronically Signed: By: Margaretha Sheffield MD On: 11/13/2019 12:25

## 2019-12-05 ENCOUNTER — Ambulatory Visit: Payer: 59

## 2019-12-05 ENCOUNTER — Institutional Professional Consult (permissible substitution): Payer: 59 | Admitting: Radiation Oncology

## 2019-12-05 ENCOUNTER — Ambulatory Visit
Admission: RE | Admit: 2019-12-05 | Discharge: 2019-12-05 | Disposition: A | Discharge status: Home / Self Care | Payer: 59 | Source: Ambulatory Visit | Attending: Radiation Oncology | Admitting: Radiation Oncology

## 2019-12-05 DIAGNOSIS — C7931 Secondary malignant neoplasm of brain: Secondary | ICD-10-CM | POA: Diagnosis not present

## 2019-12-06 ENCOUNTER — Other Ambulatory Visit: Payer: Self-pay

## 2019-12-06 ENCOUNTER — Ambulatory Visit
Admission: RE | Admit: 2019-12-06 | Discharge: 2019-12-06 | Disposition: A | Discharge status: Home / Self Care | Payer: 59 | Source: Ambulatory Visit | Attending: Radiation Oncology | Admitting: Radiation Oncology

## 2019-12-06 ENCOUNTER — Inpatient Hospital Stay (HOSPITAL_BASED_OUTPATIENT_CLINIC_OR_DEPARTMENT_OTHER): Payer: 59 | Admitting: Internal Medicine

## 2019-12-06 DIAGNOSIS — C7931 Secondary malignant neoplasm of brain: Secondary | ICD-10-CM | POA: Diagnosis not present

## 2019-12-06 DIAGNOSIS — Z515 Encounter for palliative care: Secondary | ICD-10-CM | POA: Diagnosis not present

## 2019-12-06 MED ORDER — CELECOXIB 100 MG PO CAPS: 100.0000 mg | ORAL_CAPSULE | Freq: Two times a day (BID) | ORAL | 3 refills | Status: DC

## 2019-12-06 MED ORDER — NALOXEGOL OXALATE 25 MG PO TABS: 25.0000 mg | ORAL_TABLET | Freq: Every day | ORAL | 3 refills | Status: DC

## 2019-12-06 MED ORDER — LISDEXAMFETAMINE DIMESYLATE 40 MG PO CAPS: 40.0000 mg | ORAL_CAPSULE | ORAL | 0 refills | Status: DC

## 2019-12-06 MED ORDER — LIOTHYRONINE SODIUM 25 MCG PO TABS: 25.0000 ug | ORAL_TABLET | Freq: Every day | ORAL | 3 refills | Status: DC

## 2019-12-09 ENCOUNTER — Ambulatory Visit
Admission: RE | Admit: 2019-12-09 | Discharge: 2019-12-09 | Disposition: A | Discharge status: Home / Self Care | Payer: 59 | Source: Ambulatory Visit | Attending: Radiation Oncology | Admitting: Radiation Oncology

## 2019-12-09 DIAGNOSIS — C7931 Secondary malignant neoplasm of brain: Secondary | ICD-10-CM | POA: Diagnosis not present

## 2019-12-10 ENCOUNTER — Telehealth: Payer: Self-pay | Admitting: *Deleted

## 2019-12-10 ENCOUNTER — Other Ambulatory Visit: Payer: Self-pay

## 2019-12-10 ENCOUNTER — Ambulatory Visit
Admission: RE | Admit: 2019-12-10 | Discharge: 2019-12-10 | Disposition: A | Discharge status: Home / Self Care | Payer: 59 | Source: Ambulatory Visit | Attending: Radiation Oncology | Admitting: Radiation Oncology

## 2019-12-10 ENCOUNTER — Inpatient Hospital Stay: Payer: 59

## 2019-12-10 ENCOUNTER — Inpatient Hospital Stay (HOSPITAL_BASED_OUTPATIENT_CLINIC_OR_DEPARTMENT_OTHER): Payer: 59 | Admitting: Medical

## 2019-12-10 ENCOUNTER — Other Ambulatory Visit: Payer: Self-pay | Admitting: Medical

## 2019-12-10 DIAGNOSIS — G893 Neoplasm related pain (acute) (chronic): Secondary | ICD-10-CM

## 2019-12-10 DIAGNOSIS — C7931 Secondary malignant neoplasm of brain: Secondary | ICD-10-CM | POA: Diagnosis not present

## 2019-12-10 DIAGNOSIS — C50512 Malignant neoplasm of lower-outer quadrant of left female breast: Secondary | ICD-10-CM | POA: Diagnosis not present

## 2019-12-10 DIAGNOSIS — C7951 Secondary malignant neoplasm of bone: Secondary | ICD-10-CM

## 2019-12-10 MED ORDER — HYDROMORPHONE HCL 1 MG/ML IJ SOLN
2.0000 mg | Freq: Once | INTRAMUSCULAR | Status: AC
  Administered 2019-12-10: 2 mg via INTRAMUSCULAR

## 2019-12-10 MED ORDER — HYDROMORPHONE HCL 1 MG/ML IJ SOLN: 2.0000 mg | Freq: Once | INTRAMUSCULAR | Status: DC

## 2019-12-10 NOTE — Telephone Encounter (Signed)
Pt called with c/o right scapula pain of 10/10.Marland Kitchen  Pt took percocet earlier which reduced pain to 8/10 at 1pm. Pt pain back to 10/10. Dr. Lindi Adie notified. Pt to be seen in [facility] and notified to check in after xrt complete. Received understanding.

## 2019-12-10 NOTE — Progress Notes (Signed)
Call was received from Dr. Geralyn Flash nurse reporting that Laura Anthony was in radiation oncology and was having a pain crisis. Dr. Lindi Adie requested that she be given Dilaudid 2 mg IM x 1. She reported intense pain at the level of her inferior scapula just lateral to her thoracic spine. She has multiple pain medications at home including Percocet 10-325 prn and methadone 10 mg PO BID. She was given Dilaudid 2 mg IM x 1 and watched for 30 minutes. Her pain improved. A brief exam showed an area of tenderness and induration consistent with a muscle spasm. She was told to increase her methadone to Q 8 hours as written. Additionallyshe was told to try taking Robaxin or Flexeril which she has at home. She continue with radiation therapy and will return this week as needed.  Sandi Mealy, MHS, PA-C Physician Assistant

## 2019-12-11 ENCOUNTER — Ambulatory Visit
Admission: RE | Admit: 2019-12-11 | Discharge: 2019-12-11 | Disposition: A | Discharge status: Home / Self Care | Payer: 59 | Source: Ambulatory Visit | Attending: Radiation Oncology | Admitting: Radiation Oncology

## 2019-12-11 ENCOUNTER — Other Ambulatory Visit: Payer: Self-pay

## 2019-12-11 ENCOUNTER — Encounter: Payer: Self-pay | Admitting: Radiation Oncology

## 2019-12-11 ENCOUNTER — Other Ambulatory Visit: Payer: Self-pay | Admitting: Hematology and Oncology

## 2019-12-11 DIAGNOSIS — C7951 Secondary malignant neoplasm of bone: Secondary | ICD-10-CM

## 2019-12-11 DIAGNOSIS — C7931 Secondary malignant neoplasm of brain: Secondary | ICD-10-CM | POA: Diagnosis not present

## 2019-12-11 NOTE — Progress Notes (Signed)
Scapular pain: will order CT chest

## 2019-12-11 NOTE — Progress Notes (Signed)
Palliative Care Consultation  Laura Anthony is a 44 yo woman with metastatic breast cancer since 2018. We first saw Laura Anthony about a year ago when she had a hospitalization for intractable cancer related pain and have been advising on pain management as needed with her primary oncology team. Today I met Laura Anthony in person and we spent time discussing her current needs and planning for the future. Laura Anthony is engaged and insightful, she has a determined spirit, but is quite realistic about her future. She has also shown remarkable resilence through this journey. Laura Anthony is a mom to three kids with the youngest child being 89 years old and two high school age boys. She also has a supportive husband and community of friends rallying around her. Laura Anthony wasn't sure that she would make it through the holidays last year and she is grateful to be here and functioning as well as she is given her disease progression.  Laura Anthony had some specific goals to achieve during today's visit and we also discussed some symptom management needs as follows:  1. Advance Care Planning: we completed a MOST form, she is DNR, CMO in the event of terminal decline  2. She wants to be established with a hospice provider who can assist her with palliative care planning needs and transition her into hospice care at the appropriate time and not in a crisis situation. She has expressed a desire to die in a hospice facility and not in her own home so that her family doesn't have that memory and so that she gets expert EOL care and symptom management. She wants to establish with the [facility] through [facility] and transition to their hospice service when that time comes. I will place that referral today.  Laura Anthony is planning for not only herself but her family-she wants to write letter to her children and think about ways she can leave a legacy for them throughout their lives. I offered to support her in this and can  schedule a visit for Laura Anthony to do this -[facility] may also be able to help her with this.  4. Symptom management:  Cancer related fatigue: started her on Vyvanse 40 mg daily. 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 Will add celebrex for anti-inflammatory.  Will follow up with her as needed and in 2-3 weeks connect with her in the cancer center.  Laura Hacker, DO Palliative Medicine

## 2019-12-12 ENCOUNTER — Encounter: Payer: Self-pay | Admitting: *Deleted

## 2019-12-12 NOTE — Progress Notes (Signed)
    The home-based Palliative Care Division of [facility].  Rec'd referral via Epic from Dr Rhea Pink. Will follow up with oncology and pt.  Thank you for this referral and the opportunity to serve the patient and family.  Wynetta Fines, RN

## 2019-12-13 ENCOUNTER — Encounter: Payer: Self-pay | Admitting: *Deleted

## 2019-12-13 NOTE — Progress Notes (Signed)
   Initial  visit is scheduled for Tuesday, 11/17 at 10am with [facility].  This program is a home-based palliative care program, a division of [facility].  Ronnell [place] Referral Specialist [phone]

## 2019-12-17 ENCOUNTER — Telehealth: Payer: Self-pay | Admitting: Hematology and Oncology

## 2019-12-17 NOTE — Telephone Encounter (Signed)
Spoke to the patient to confirm appointment for tomorrow with Radiology, gave patients instructions and location of appointment

## 2019-12-18 ENCOUNTER — Encounter (HOSPITAL_COMMUNITY): Payer: Self-pay

## 2019-12-18 ENCOUNTER — Encounter: Payer: Self-pay | Admitting: *Deleted

## 2019-12-18 ENCOUNTER — Ambulatory Visit (HOSPITAL_COMMUNITY)
Admission: RE | Admit: 2019-12-18 | Discharge: 2019-12-18 | Disposition: A | Discharge status: Home / Self Care | Payer: 59 | Source: Ambulatory Visit | Attending: Hematology and Oncology | Admitting: Hematology and Oncology

## 2019-12-18 ENCOUNTER — Other Ambulatory Visit: Payer: Self-pay

## 2019-12-18 DIAGNOSIS — C7951 Secondary malignant neoplasm of bone: Secondary | ICD-10-CM | POA: Diagnosis not present

## 2019-12-18 MED ORDER — IOHEXOL 300 MG/ML  SOLN
75.0000 mL | Freq: Once | INTRAMUSCULAR | Status: AC | PRN
  Administered 2019-12-18: 75 mL via INTRAVENOUS

## 2019-12-18 MED ORDER — HEPARIN SOD (PORK) LOCK FLUSH 100 UNIT/ML IV SOLN: 500.0000 [IU] | Freq: Once | INTRAVENOUS | Status: AC

## 2019-12-18 MED ORDER — HEPARIN SOD (PORK) LOCK FLUSH 100 UNIT/ML IV SOLN
INTRAVENOUS | Status: AC
  Administered 2019-12-18: 100 [IU] via INTRAVENOUS
  Filled 2019-12-18: qty 5

## 2019-12-18 MED FILL — Fosaprepitant Dimeglumine For IV Infusion 150 MG (Base Eq): INTRAVENOUS | Qty: 5 | Status: AC

## 2019-12-18 NOTE — Progress Notes (Signed)
Patient Care Team: [name], MD as PCP - General (Family Medicine) Mauro Kaufmann, RN as Oncology Nurse Navigator Carlynn Spry, Charlott Holler, RN as Oncology Nurse Navigator [place], Aletha Halim, NP as Nurse Practitioner (Hospice and Palliative Medicine) Nicholas Lose, MD as Consulting Physician (Hematology and Oncology) Delice Bison, Charlestine Massed, NP as Nurse Practitioner (Hematology and Oncology) [place], [place] as Registered Nurse [facility] and Palliative Medicine)  DIAGNOSIS:    ICD-10-CM   1. Metastatic breast cancer ([place])  C50.919   2. Malignant neoplasm of lower-outer quadrant of left breast of female, estrogen receptor negative ([name])  C50.512    Z17.1     SUMMARY OF ONCOLOGIC HISTORY: 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, estrogen receptor negative ([place])  10/20/2016 Mammogram   Mammogram and ultrasound of the left breast revealed 1.7 cm mass at 4:00 position, 6:30 position 5 x 4 x 4 mm mass, 6:00 position 5 cm nipple 7 x 6 x 11 mm, left axillary lymph node with thickened cortex, T1c N1 stage II a AJCC 8   10/24/2016 Initial Diagnosis   Left breast biopsy 6:30 position 3 cm from nipple: IDC grade 2, DCIS, ER 0%, PR 0%, Ki-67 15% HER-2 positive ratio 2.1; 4:00 position 3 cm from nipple: IDC grade 2, DCIS, ER 0%, PR 0%, Ki-67 35%, HER-2 positive ratio 2.02; left axillary lymph node biopsy positive   11/04/2016 - 02/17/2017 Neo-Adjuvant Chemotherapy   TCH Perjeta 6 cycles followed by Herceptin + Perjeta maintenance to be completed September 2019   11/30/2016 Genetic Testing   Negative genetic testing on the common hereditary cancer panel.  The Hereditary Gene Panel offered by [facility] includes sequencing and/or deletion duplication testing of the following 47 genes: APC, ATM, AXIN2, BARD1, BMPR1A, BRCA1, BRCA2, BRIP1, CDH1, CDK4, CDKN2A (p14ARF), CDKN2A (p16INK4a), CHEK2, CTNNA1, DICER1, EPCAM (Deletion/duplication testing only), GREM1  (promoter region deletion/duplication testing only), KIT, MEN1, MLH1, MSH2, MSH3, MSH6, MUTYH, NBN, NF1, NHTL1, PALB2, PDGFRA, PMS2, POLD1, POLE, PTEN, RAD50, RAD51C, RAD51D, SDHB, SDHC, SDHD, SMAD4, SMARCA4. STK11, TP53, TSC1, TSC2, and VHL.  The following genes were evaluated for sequence changes only: SDHA and HOXB13 c.251G>A variant only. The report date is November 30, 2016.    03/27/2017 Surgery   Bilateral mastectomies: Left mastectomy: IDC grade 2 0.9 cm, nodes negative, right mastectomy benign, ER 0%, PR 0%, HER-2 positive ratio 2.6   05/08/2017 - 06/09/2017 Radiation Therapy   Adjuvant radiation therapy   10/23/2017 Miscellaneous   Neratinib discontinued after 4 weeks for severe diarrhea   07/25/2018 Relapse/Recurrence   MRI of right elbow showed bone lesion consistent with malignancy. PET scan showed bilateral pulmonary nodules and several lytic bone lesions compatible with metastatic disease. Brain MRI on 08/02/18 showed no evidence of metastatic disease.   08/02/2018 PET scan   Bilateral hypermetabolic lung nodules, LUL 1.3 cm with SUV 3.88, lingular nodule 1.4 cm SUV 3.7, central lingular nodule 1.2 cm SUV 9.76, right middle lobe nodule 1.5 cm SUV 9.9, lytic bone metastases inferior pubic ramus, sacrum, T12, right 11th rib.   08/08/2018 Procedure   Lung biopsy: metastatic carcinoma, HER-2 negative (0), ER/PR negative.   08/10/2018 -  Radiation Therapy   Palliative radiatio to the right humerus along the medial condyle   08/24/2018 - 09/05/2018 Radiation Therapy   Palliative radiation to the right 11th rib and right elbow   09/26/2018 - 12/04/2018 Chemotherapy   Carboplatin atezolizumab at [facility] with Dr. Janan Halter on  [facility] 043 clinical trial stopped because of new T5 metastases (toxicities included myopathy required prednisone, immune mediated thyroiditis), right upper extremity DVT on apixaban   12/14/2018 - 04/10/2019 Chemotherapy   [place]   12/17/2018 -  Radiation Therapy    Palliative radiation to sternal, sacral & pelvic lesions and SRS for T3 and C7-T1 lesions.   04/11/2019 -  Chemotherapy   Sacituzumab-Govitecan [name])   Bone metastases ([place])  08/07/2018 Initial Diagnosis   Bone metastases ([place])   12/14/2018 - 03/28/2019 Chemotherapy   The patient had pertuzumab (PERJETA) 420 mg in sodium chloride 0.9 % 250 mL chemo infusion, 420 mg (100 % of original dose 420 mg), Intravenous, Once, 5 of 6 cycles Dose modification: 420 mg (original dose 420 mg, Cycle 1, Reason: Provider Judgment) Administration: 420 mg (12/14/2018), 420 mg (01/10/2019), 420 mg (03/28/2019), 420 mg (01/30/2019), 420 mg (03/01/2019) trastuzumab-dkst (OGIVRI) 600 mg in sodium chloride 0.9 % 250 mL chemo infusion, 609 mg, Intravenous,  Once, 5 of 6 cycles Administration: 600 mg (12/14/2018), 450 mg (01/10/2019), 450 mg (03/28/2019), 450 mg (01/30/2019), 450 mg (03/01/2019)  for chemotherapy treatment.    12/17/2018 -  Radiation Therapy   Palliative radiation to sternal, sacral & pelvic lesions and SRS for T3 and C7-T1 lesions.     CHIEF COMPLIANT: Follow-up of metastatic breast cancer  INTERVAL HISTORY: Laura Anthony is a 44 y.o. with above-mentioned history of metastatic breast cancer who is currently ontreatment with Xeloda.Chest CT on 12/18/19 showed progressive widespread pulmonary metastatic disease and lytic metastatic disease, and stable small left pleural effusion. She presents to the clinic todayto discuss her scan and treatment plan.   ALLERGIES:  is allergic to denosumab, statins, sumatriptan, and tape.  MEDICATIONS:  Current Outpatient Medications  Medication Sig Dispense Refill  . capecitabine (XELODA) 500 MG tablet Take 2 tablets (1,000 mg total) by mouth in AM and take 3 tablets (1,500 mg total) by mouth in PM. Take after meals. Take 14 days on, 7 days off, repeat every 21 days. 70 tablet 0  . celecoxib (CELEBREX) 100 MG capsule Take 1 capsule (100 mg total) by  mouth 2 (two) times daily. 60 capsule 3  . ELIQUIS 5 MG TABS tablet Take 1 tablet (5 mg total) by mouth 2 (two) times daily. 60 tablet 3  . gabapentin (NEURONTIN) 300 MG capsule TAKE 2 CAPSULES BY MOUTH AT BEDTIME 60 capsule 5  . levothyroxine (SYNTHROID) 75 MCG tablet Take 1 tablet (75 mcg total) by mouth daily before breakfast. 30 tablet 6  . lidocaine-prilocaine (EMLA) cream Apply topically daily.    Marland Kitchen liothyronine (CYTOMEL) 25 MCG tablet Take 1 tablet (25 mcg total) by mouth daily. 30 tablet 3  . lisdexamfetamine (VYVANSE) 40 MG capsule Take 1 capsule (40 mg total) by mouth every morning. 30 capsule 0  . LORazepam (ATIVAN) 0.5 MG tablet Take 1 tablet (0.5 mg total) by mouth every 6 (six) hours as needed for anxiety. 45 tablet 1  . methadone (DOLOPHINE) 10 MG tablet Take 1 tablet (10 mg total) by mouth every 8 (eight) hours. 90 tablet 0  . naloxegol oxalate (MOVANTIK) 25 MG TABS tablet Take 1 tablet (25 mg total) by mouth daily. 30 tablet 3  . naloxone (NARCAN) 4 MG/0.1ML LIQD nasal spray kit Spray once in one nostril prn overdose, may repeat x 1 (Patient not taking: Reported on 11/14/2019) 1 each 1  . oxyCODONE-acetaminophen (PERCOCET) 10-325 MG tablet Take 1 tablet by mouth every 4 (four) hours as needed for  pain. 120 tablet 0  . prochlorperazine (COMPAZINE) 10 MG tablet Take 1 tablet (10 mg total) by mouth every 6 (six) hours as needed (Nausea or vomiting). 30 tablet 1  . talazoparib tosylate (TALZENNA) 1 MG capsule Take 1 capsule (1 mg total) by mouth daily. 30 capsule 3  . venlafaxine XR (EFFEXOR-XR) 150 MG 24 hr capsule TAKE 1 CAPSULE (150 MG TOTAL) BY MOUTH DAILY WITH BREAKFAST. 90 capsule 3  . zolpidem (AMBIEN CR) 12.5 MG CR tablet Take 1 tablet (12.5 mg total) by mouth at bedtime. 30 tablet 3   No current facility-administered medications for this visit.    PHYSICAL EXAMINATION: ECOG PERFORMANCE STATUS: 1 - Symptomatic but completely ambulatory  Vitals:   12/19/19 1504  BP:  112/74  Pulse: 97  Resp: 18  Temp: (!) 97.5 F (36.4 C)  SpO2: 96%   Filed Weights   12/19/19 1504  Weight: 158 lb 9.6 oz (71.9 kg)    LABORATORY DATA:  I have reviewed the data as listed CMP Latest Ref Rng & Units 12/19/2019 11/21/2019 10/24/2019  Glucose 70 - 99 mg/dL 103(H) 87 65(L)  BUN 6 - 20 mg/dL _0 Creatinine 0.44 - 1.00 mg/dL 0.88 0.86 0.82  Sodium 135 - 145 mmol/L 137 140 141  Potassium 3.5 - 5.1 mmol/L 3.9 4.0 3.4(L)  Chloride 98 - 111 mmol/L 99 103 104  CO2 22 - 32 mmol/L _1 Calcium 8.9 - 10.3 mg/dL 9.9 10.1 9.5  Total Protein 6.5 - 8.1 g/dL 7.4 6.8 6.8  Total Bilirubin 0.3 - 1.2 mg/dL 0.5 0.4 0.3  Alkaline Phos 38 - 126 U/L 167(H) 120 99  AST 15 - 41 U/L 41 25 19  ALT 0 - 44 U/L _2 Lab Results  Component Value Date   WBC 4.7 12/19/2019   HGB 10.8 (L) 12/19/2019   HCT 31.2 (L) 12/19/2019   MCV 96.6 12/19/2019   PLT 187 12/19/2019   NEUTROABS 3.3 12/19/2019    ASSESSMENT & PLAN:  Malignant neoplasm of lower-outer quadrant of left breast of female, estrogen receptor negative ([place]) 10/24/2016: Left breast biopsy 6:30 position 3 cm from nipple: IDC grade 2, DCIS, ER 0%, PR 0%, Ki-67 15% HER-2 positive ratio 2.1; 4:00 position 3 cm from nipple: IDC grade 2, DCIS, ER 0%, PR 0%, Ki-67 35%, HER-2 positive ratio 2.02 Lymph node biopsy positive  Treatment Summary: 1. Neoadjuvant chemotherapy with TCHPcompleted 02/17/2017 this would be followed by Herceptinand Perjetamaintenance for 1 yearcompleted September 2019 2.Bilateral mastectomies 03/28/2016:Bilateral mastectomies: Left mastectomy: IDC grade 2 0.9 cm, nodes negative, right mastectomy benign, ER 0%, PR 0%, HER-2 positive ratio 2.6 3.Adjuvant radiation4/08/2017 to 06/09/2017 4.Neratinib started 10/12/2017 discontinued due to diarrhea 5. Elbow fracture: Due to metastatic disease, palliative radiation therapy 6. Carboplatin atezolizumab at [facility] on a clinical trial [facility]  043 stopped for progression 12/04/2018 7.  [name] discontinued because of progression 8.  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  Metastatic disease to the thoracic spine T5 and T8 small left pleural effusion with pleural thickening.  Increased opacity left lower lobe atelectasis versus tumor  Based on presence of ATM mutation, I recommended switching her treatment to talazoparib The major side effects of the talazoparib include fatigue, headache, increased blood sugars, decreased calcium, nausea vomiting diarrhea, hematologic toxicities like anemia neutropenia and thrombocytopenia as well as elevated LFTs.  Return to clinic in 4 weeks with labs and follow-up.    No orders of the defined types were placed in this encounter.  The patient has a good understanding of the overall plan. she agrees with it. she will call with any problems that may develop before the next visit here.  Total time spent: 30 mins including face to face time and time spent for planning, charting and coordination of care  Nicholas Lose, MD 12/19/2019  I, Cloyde Reams Dorshimer, am acting as  scribe for Dr. Nicholas Lose.  I have reviewed the above documentation for accuracy and completeness, and I agree with the above.

## 2019-12-18 NOTE — Progress Notes (Signed)
   Pt was admitted to [facility]in her home on 12/17/19.  She will be receiving nursing home visits 1-3 x/month and home-based MSW support as she navigates her illness.  Wynetta Fines, RN  [phone]

## 2019-12-19 ENCOUNTER — Other Ambulatory Visit: Payer: Self-pay | Admitting: *Deleted

## 2019-12-19 ENCOUNTER — Inpatient Hospital Stay (HOSPITAL_BASED_OUTPATIENT_CLINIC_OR_DEPARTMENT_OTHER): Payer: 59 | Admitting: Hematology and Oncology

## 2019-12-19 ENCOUNTER — Telehealth: Payer: Self-pay

## 2019-12-19 ENCOUNTER — Inpatient Hospital Stay: Payer: 59

## 2019-12-19 ENCOUNTER — Telehealth: Payer: Self-pay | Admitting: Pharmacist

## 2019-12-19 ENCOUNTER — Telehealth: Payer: Self-pay | Admitting: Hematology and Oncology

## 2019-12-19 ENCOUNTER — Other Ambulatory Visit: Payer: Self-pay

## 2019-12-19 VITALS — BP 112/74 | HR 97 | Temp 97.5°F | Resp 18 | Ht 67.0 in | Wt 158.6 lb

## 2019-12-19 DIAGNOSIS — C7951 Secondary malignant neoplasm of bone: Secondary | ICD-10-CM

## 2019-12-19 DIAGNOSIS — C50919 Malignant neoplasm of unspecified site of unspecified female breast: Secondary | ICD-10-CM | POA: Diagnosis not present

## 2019-12-19 DIAGNOSIS — C50512 Malignant neoplasm of lower-outer quadrant of left female breast: Secondary | ICD-10-CM

## 2019-12-19 DIAGNOSIS — Z171 Estrogen receptor negative status [ER-]: Secondary | ICD-10-CM | POA: Diagnosis not present

## 2019-12-19 LAB — CBC WITH DIFFERENTIAL (CANCER CENTER ONLY)
Abs Immature Granulocytes: 0.01 10*3/uL (ref 0.00–0.07)
Basophils Absolute: 0 10*3/uL (ref 0.0–0.1)
Basophils Relative: 1 %
Eosinophils Absolute: 0.1 10*3/uL (ref 0.0–0.5)
Eosinophils Relative: 1 %
HCT: 31.2 % — ABNORMAL LOW (ref 36.0–46.0)
Hemoglobin: 10.8 g/dL — ABNORMAL LOW (ref 12.0–15.0)
Immature Granulocytes: 0 %
Lymphocytes Relative: 14 %
Lymphs Abs: 0.6 10*3/uL — ABNORMAL LOW (ref 0.7–4.0)
MCH: 33.4 pg (ref 26.0–34.0)
MCHC: 34.6 g/dL (ref 30.0–36.0)
MCV: 96.6 fL (ref 80.0–100.0)
Monocytes Absolute: 0.6 10*3/uL (ref 0.1–1.0)
Monocytes Relative: 13 %
Neutro Abs: 3.3 10*3/uL (ref 1.7–7.7)
Neutrophils Relative %: 71 %
Platelet Count: 187 10*3/uL (ref 150–400)
RBC: 3.23 MIL/uL — ABNORMAL LOW (ref 3.87–5.11)
RDW: 14.7 % (ref 11.5–15.5)
WBC Count: 4.7 10*3/uL (ref 4.0–10.5)
nRBC: 0 % (ref 0.0–0.2)

## 2019-12-19 LAB — CMP (CANCER CENTER ONLY)
ALT: 29 U/L (ref 0–44)
AST: 41 U/L (ref 15–41)
Albumin: 3.3 g/dL — ABNORMAL LOW (ref 3.5–5.0)
Alkaline Phosphatase: 167 U/L — ABNORMAL HIGH (ref 38–126)
Anion gap: 7 (ref 5–15)
BUN: 19 mg/dL (ref 6–20)
CO2: 31 mmol/L (ref 22–32)
Calcium: 9.9 mg/dL (ref 8.9–10.3)
Chloride: 99 mmol/L (ref 98–111)
Creatinine: 0.88 mg/dL (ref 0.44–1.00)
GFR, Estimated: >60 mL/min (ref >60)
Glucose, Bld: 103 mg/dL — ABNORMAL HIGH (ref 70–99)
Potassium: 3.9 mmol/L (ref 3.5–5.1)
Sodium: 137 mmol/L (ref 135–145)
Total Bilirubin: 0.5 mg/dL (ref 0.3–1.2)
Total Protein: 7.4 g/dL (ref 6.5–8.1)

## 2019-12-19 LAB — MAGNESIUM: Magnesium: 1.9 mg/dL (ref 1.7–2.4)

## 2019-12-19 LAB — PHOSPHORUS: Phosphorus: 5.5 mg/dL — ABNORMAL HIGH (ref 2.5–4.6)

## 2019-12-19 MED ORDER — TALAZOPARIB TOSYLATE 1 MG PO CAPS: 1.0000 mg | ORAL_CAPSULE | Freq: Every day | ORAL | 3 refills | Status: DC

## 2019-12-19 NOTE — Telephone Encounter (Signed)
Oral Oncology Patient Advocate Encounter  Received notification from [place] that prior authorization for Laura Anthony is required.  PA submitted on CoverMyMeds Key [id number] Status is pending  Oral Oncology Clinic will continue to follow.   [employer] Patient [place] Phone [phone] Fax [phone] 12/19/2019 3:37 PM

## 2019-12-19 NOTE — Assessment & Plan Note (Signed)
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 Neoadjuvant chemotherapy with TCHPcompleted 02/17/2017 this would be followed by Herceptinand Perjetamaintenance for 1 yearcompleted September 2019 2.Bilateral mastectomies 03/28/2016:Bilateral mastectomies: Left mastectomy: IDC grade 2 0.9 cm, nodes negative, right mastectomy benign, ER 0%, PR 0%, HER-2 positive ratio 2.6 3.Adjuvant radiation4/08/2017 to 06/09/2017 4.Neratinib started 10/12/2017 discontinued due to diarrhea 5. Elbow fracture: Due to metastatic disease, palliative radiation therapy 6. Carboplatin atezolizumab at [facility] on a clinical trial [facility] 043 stopped for progression 12/04/2018 7.  [name] discontinued because of progression 8.  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  Metastatic disease to the thoracic spine T5 and T8 small left pleural effusion  with pleural thickening.  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

## 2019-12-19 NOTE — Telephone Encounter (Signed)
Scheduled appts per 11/18 los. Pt stated she would refer to mychart for AVS and appt details.

## 2019-12-19 NOTE — Telephone Encounter (Signed)
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  Prescription dose and frequency assessed for appropriateness. Appropriate for therapy initiation.   CBC w/ Diff and CMP from 12/19/19 assessed, labs overall stable for Talzenna initiation - no baseline dose adjustments required. Renal function stable.  Current medication list in Epic reviewed, no relevant/significant DDIs with Talzenna (talazoparib) identified.  Evaluated chart and no patient barriers to medication adherence noted.   Prescription has been e-scribed to the [facility] for benefits analysis and approval.   Oral Oncology Clinic will continue to follow for insurance authorization, copayment issues, initial counseling and start date.  Leron Croak, PharmD, BCPS Hematology/Oncology Clinical Pharmacist [facility] [phone] 12/19/2019 3:37 PM

## 2019-12-20 ENCOUNTER — Other Ambulatory Visit: Payer: Self-pay | Admitting: Hematology and Oncology

## 2019-12-20 ENCOUNTER — Encounter: Payer: Self-pay | Admitting: *Deleted

## 2019-12-20 LAB — TSH: TSH: <0.08 u[IU]/mL — ABNORMAL LOW (ref 0.308–3.960)

## 2019-12-20 MED ORDER — LEVOTHYROXINE SODIUM 75 MCG PO TABS: 75.0000 ug | ORAL_TABLET | Freq: Every day | ORAL | 6 refills | Status: DC

## 2019-12-20 NOTE — Progress Notes (Signed)
TSH less than 0.08, reduce the dosage of Synthroid to 75 mcg daily.

## 2019-12-23 NOTE — Telephone Encounter (Signed)
Oral Oncology Pharmacist Encounter   Prior Authorization for Cain Saupe (talazoparib) has been denied.    Will proceed with appeal process at this time with [employer].  Appeal letter faxed to [phone]   Oral Oncology Clinic will continue to follow.    Leron Croak, PharmD, BCPS Hematology/Oncology Clinical Pharmacist [place] [facility] [phone] 12/23/2019 8:19 AM

## 2019-12-24 ENCOUNTER — Telehealth: Payer: Self-pay | Admitting: Licensed Clinical Social Worker

## 2019-12-24 NOTE — Telephone Encounter (Signed)
[employer] Work  CSW received request from Engineer, site to share counseling resources with patient for her children (2 teenage boys, 7th grade girl). Navigator already told patient about [facility].  CSW attempted to contact patient to discuss other potential options. No answer. Left VM with direct contact information.   Christeen Douglas, LCSW

## 2019-12-27 NOTE — Telephone Encounter (Signed)
Oral Oncology Pharmacist Encounter   Appeal for Cain Saupe (talazoparib) denial has been upheld.   Will proceed with applying for manufacturer assistance at this time.     Oral Oncology Clinic will continue to follow.    Leron Croak, PharmD, BCPS Hematology/Oncology Clinical Pharmacist [place] [facility] [phone] 12/27/2019 8:18 AM

## 2019-12-29 ENCOUNTER — Encounter: Payer: Self-pay | Admitting: Hematology and Oncology

## 2019-12-30 ENCOUNTER — Other Ambulatory Visit: Payer: Self-pay

## 2019-12-30 ENCOUNTER — Telehealth: Payer: Self-pay

## 2019-12-30 NOTE — Telephone Encounter (Signed)
Oral Oncology Patient Advocate Encounter  Met patient in [place] to complete application for [employer] in an effort to reduce patient's out of pocket expense for Talzenna to $0.    Application completed and faxed to [phone].   [employer] patient assistance phone number for follow up is [phone].   This encounter will be updated until final determination.   [place] Patient [place] Phone [phone] Fax [phone] 12/30/2019 2:15 PM

## 2019-12-31 ENCOUNTER — Encounter: Payer: Self-pay | Admitting: *Deleted

## 2019-12-31 ENCOUNTER — Other Ambulatory Visit: Payer: Self-pay | Admitting: *Deleted

## 2019-12-31 MED ORDER — RIVAROXABAN 20 MG PO TABS: 20.0000 mg | ORAL_TABLET | Freq: Every day | ORAL | 1 refills | Status: DC

## 2019-12-31 NOTE — Progress Notes (Signed)
Received message from pt stating in January 2022 pt insurance is changing and will no longer cover Eliquis but will cover Xarelto.  Per MD pt to stop Eliquis and start Xarelto 20 mg p.o daily.  Pt notified and prescription sent to pharmacy on file.

## 2020-01-06 ENCOUNTER — Telehealth: Payer: Self-pay | Admitting: Radiation Oncology

## 2020-01-06 NOTE — Telephone Encounter (Signed)
Radiation Oncology         [phone] ________________________________  Outpatient Follow Up - Conducted via telephone due to current COVID-19 concerns for limiting patient exposure  I spoke with the patient to conduct this consult visit via telephone to spare the patient unnecessary potential exposure in the healthcare setting during the current COVID-19 pandemic. The patient was notified in advance and was offered a [place] meeting to allow for face to face communication but unfortunately reported that they did not have the appropriate resources/technology to support such a visit and instead preferred to proceed with a telephone visit.  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  12/13/2018-01/07/2019:  The sternum, pelvis and sacrum, T and C spine were treated to 37.5 Gy in 15 fractions   12/19/18 SRS Treatment: The T1 vertebral body was treated with stereotactic radiosurgery (SRS)[facility]18 Gy in 1 fraction  08/22/2018-09/05/2018:  30 Gy in 10 fractions to the right 11th rib  08/08/2018-08/21/2018:  30 Gy in 10 fractions to the right elbow  05/08/2017 - 06/21/2017: The patient initially received a dose of 50.4 Gy in 28 fractions to the leftchest wall,supraclavicular region, and posterior axillary  region. This was delivered using a 3-D conformal, 4 field technique. The patient then received a boost to the mastectomy scar. This delivered an additional 10 Gy in 5 fractions using an en face electron field. The total dose was 60.4 Gy.  Narrative:  Laura Anthony pleasant 44 y.75 female with a history of recurrent metastatic breast cancer involving the lungs, bone, and brain.  She is well-known to our service and has had multiple courses of radiotherapy with her most recent being SRS to the brain to 5 subcentimeter lesions. She tolerated this well without significant toxicities. She continues on Xeloda with Dr. GudenLindi Adiethis is her week of of her course. She reports she has had progressive pain in her right collar bone region and was seen in symptom management clinic today. A 2 view chest x-ray today revealed a left mid lung infiltrate possibly consistent with pneumonia and a small left effusion, with known nodular densities in her chest. A lytic lesion in the right medial clavicular head was seen as well. Given these findings and her pain, her breast navigator reached out to see if we could consider further radiotherapy. She was also started on Augmentin for the possible pneumonia. As a note she is fully vaccinated but did have covid 19 in June 2021. She has tested negative since that infection. She plans to retest again today to be sure.   On review of systems, the patient reports that she is doing well since completing radiation and her shoulder pain has resolved. She's waiting to hear back about authorization on her talazoparib. No other complaints are verbalized.  Impression/Plan: 1. 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 She seems to be doing well.  We plan to resume her surveillance in the  brain and spine conference with her next MRI in January 2022. She will continue follow up with Dr. Lindi Adie once on talazoparib. 2. Goals of care. The patient had a great meeting with Dr. [name] a few weeks ago and has care connections at home which she's been pleased by so far. We will follow this expectantly. 3. Left pulmonary infiltrate. Unfortunately this is most consistent with progressive disease rather than infection and will be treated with talazoparib.    Carola Rhine, PAC

## 2020-01-08 NOTE — Telephone Encounter (Signed)
Oral Oncology Pharmacist Encounter  Notified by [employer] that patient is not eligible for [facility] patient assistance. MD notified regarding denial coverage of Talzenna through manufacturer.   Leron Croak, PharmD, BCPS Hematology/Oncology Clinical Pharmacist [place] [facility] [phone] 01/08/2020 5:23 PM

## 2020-01-08 NOTE — Telephone Encounter (Signed)
Hi Laura Anthony, I've reached out to [employer] for an update. Thanks [employer]

## 2020-01-09 ENCOUNTER — Telehealth: Payer: Self-pay | Admitting: Pharmacist

## 2020-01-09 ENCOUNTER — Other Ambulatory Visit: Payer: Self-pay | Admitting: Hematology and Oncology

## 2020-01-09 ENCOUNTER — Telehealth: Payer: Self-pay

## 2020-01-09 DIAGNOSIS — C50919 Malignant neoplasm of unspecified site of unspecified female breast: Secondary | ICD-10-CM

## 2020-01-09 MED ORDER — OLAPARIB 100 MG PO TABS: 200.0000 mg | ORAL_TABLET | Freq: Two times a day (BID) | ORAL | 3 refills | Status: DC

## 2020-01-09 NOTE — Telephone Encounter (Signed)
Oral Oncology Patient Advocate Encounter  Received notification from [facility] that prior authorization for Lonie Peak is required.  PA submitted on CoverMyMeds Key [id number] Status is pending  Oral Oncology Clinic will continue to follow.   [place] Patient [place] Phone [phone] Fax [phone] 01/09/2020 4:17 PM

## 2020-01-09 NOTE — Progress Notes (Signed)
Sending Lynparza at 100 mg p.o. twice daily dosing because I am concerned about cytopenias with the 150 p.o. twice daily dosing.

## 2020-01-09 NOTE — Telephone Encounter (Signed)
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  Prescription dose and frequency assessed for appropriateness. Patient dose reduced due to concerns of cytopenias per MD.   CBC w/ Diff and CMP from 12/19/19 assessed, labs overall stable for Lynparza initiation.  Current medication list in Epic reviewed, no relevant/significant DDIs with [place] identified.  Evaluated chart and no patient barriers to medication adherence noted.   Prescription has been e-scribed to the [facility] for benefits analysis and approval.  Oral Oncology Clinic will continue to follow for insurance authorization, copayment issues, initial counseling and start date.  Leron Croak, PharmD, BCPS Hematology/Oncology Clinical Pharmacist [facility] [phone] 01/09/2020 4:04 PM

## 2020-01-10 NOTE — Telephone Encounter (Addendum)
Oral Oncology Pharmacist Encounter   Prior Authorization for Lynparza (olaparib) has been denied.     Will proceed with appeal process at this time.   Urgent Appeal letter sent to [place] Department with supporting documentation. Appeal faxed to: [phone]  Oral Oncology Clinic will continue to follow.    Leron Croak, PharmD, BCPS Hematology/Oncology Clinical Pharmacist [facility] [phone] 01/14/2020 10:51 AM

## 2020-01-13 ENCOUNTER — Telehealth: Payer: Self-pay | Admitting: Radiation Oncology

## 2020-01-15 ENCOUNTER — Other Ambulatory Visit: Payer: Self-pay

## 2020-01-15 DIAGNOSIS — C50512 Malignant neoplasm of lower-outer quadrant of left female breast: Secondary | ICD-10-CM

## 2020-01-15 DIAGNOSIS — C7951 Secondary malignant neoplasm of bone: Secondary | ICD-10-CM

## 2020-01-15 DIAGNOSIS — Z171 Estrogen receptor negative status [ER-]: Secondary | ICD-10-CM

## 2020-01-15 NOTE — Assessment & Plan Note (Signed)
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 Neoadjuvant chemotherapy with TCHPcompleted 02/17/2017 this would be followed by Herceptinand Perjetamaintenance for 1 yearcompleted September 2019 2.Bilateral mastectomies 03/28/2016:Bilateral mastectomies: Left mastectomy: IDC grade 2 0.9 cm, nodes negative, right mastectomy benign, ER 0%, PR 0%, HER-2 positive ratio 2.6 3.Adjuvant radiation4/08/2017 to 06/09/2017 4.Neratinib started 10/12/2017 discontinued due to diarrhea 5. Elbow fracture: Due to metastatic disease, palliative radiation therapy 6. Carboplatin atezolizumab at [facility] on a clinical trial [facility] 043 stopped for progression 12/04/2018 7.[name] discontinued because of progression 8.Xeloda started 11/01/2019 discontinued 12/19/2019 for progression  10/29/2019: Guardant 360: ATM mutation: Benefit with olaparib ---------------------------------------------------------------------------------------------------------------- Lung nodule biopsy: Metastatic breast cancer triple negative Current treatment:Xeloda started 11/01/2019  Toxicities: 1.Fatigue:Markedly worse since she started Xeloda 2.hand-foot syndrome: Mild and we had to reduce the dosage of Xeloda  COVID-19 hospitalization 07/22/2019-07/25/2019: Covid pneumonia treated with remdesivir  Pain regimen: On methadone and Percocets I renewed her methadone prescription.  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  Metastatic disease to the thoracic spine T5 and T8 small left pleural effusion  with pleural thickening.  Increased opacity left lower lobe atelectasis versus tumor  Based on presence of ATM mutation, I recommended switching her treatment to talazoparib but it was not approved. Therefore, we are trying to get Olaparib approved We dicussed about the risks and benefits of Ivermectin  Return to clinic in 4 weeks with labs and follow-up.

## 2020-01-15 NOTE — Progress Notes (Signed)
Patient Care Team: [name], MD as PCP - General (Family Medicine) Mauro Kaufmann, RN as Oncology Nurse Navigator Rockwell Germany, RN as Oncology Nurse Navigator Serpe, Aletha Halim, NP as Nurse Practitioner (Hospice and Palliative Medicine) Nicholas Lose, MD as Consulting Physician (Hematology and Oncology) Delice Bison, Charlestine Massed, NP as Nurse Practitioner (Hematology and Oncology) [place], [place] as Registered Nurse (Hospice and Palliative Medicine)  DIAGNOSIS:    ICD-10-CM   1. Malignant neoplasm of lower-outer quadrant of left breast of female, estrogen receptor negative ([place])  C50.512    Z17.1     SUMMARY OF ONCOLOGIC HISTORY: 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, estrogen receptor negative ([place])  10/20/2016 Mammogram   Mammogram and ultrasound of the left breast revealed 1.7 cm mass at 4:00 position, 6:30 position 5 x 4 x 4 mm mass, 6:00 position 5 cm nipple 7 x 6 x 11 mm, left axillary lymph node with thickened cortex, T1c N1 stage II a AJCC 8   10/24/2016 Initial Diagnosis   Left breast biopsy 6:30 position 3 cm from nipple: IDC grade 2, DCIS, ER 0%, PR 0%, Ki-67 15% HER-2 positive ratio 2.1; 4:00 position 3 cm from nipple: IDC grade 2, DCIS, ER 0%, PR 0%, Ki-67 35%, HER-2 positive ratio 2.02; left axillary lymph node biopsy positive   11/04/2016 - 02/17/2017 Neo-Adjuvant Chemotherapy   TCH Perjeta 6 cycles followed by Herceptin + Perjeta maintenance to be completed September 2019   11/30/2016 Genetic Testing   Negative genetic testing on the common hereditary cancer panel.  The Hereditary Gene Panel offered by [facility] includes sequencing and/or deletion duplication testing of the following 47 genes: APC, ATM, AXIN2, BARD1, BMPR1A, BRCA1, BRCA2, BRIP1, CDH1, CDK4, CDKN2A (p14ARF), CDKN2A (p16INK4a), CHEK2, CTNNA1, DICER1, EPCAM (Deletion/duplication testing only), GREM1 (promoter region deletion/duplication testing  only), KIT, MEN1, MLH1, MSH2, MSH3, MSH6, MUTYH, NBN, NF1, NHTL1, PALB2, PDGFRA, PMS2, POLD1, POLE, PTEN, RAD50, RAD51C, RAD51D, SDHB, SDHC, SDHD, SMAD4, SMARCA4. STK11, TP53, TSC1, TSC2, and VHL.  The following genes were evaluated for sequence changes only: SDHA and HOXB13 c.251G>A variant only. The report date is November 30, 2016.    03/27/2017 Surgery   Bilateral mastectomies: Left mastectomy: IDC grade 2 0.9 cm, nodes negative, right mastectomy benign, ER 0%, PR 0%, HER-2 positive ratio 2.6   05/08/2017 - 06/09/2017 Radiation Therapy   Adjuvant radiation therapy   10/23/2017 Miscellaneous   Neratinib discontinued after 4 weeks for severe diarrhea   07/25/2018 Relapse/Recurrence   MRI of right elbow showed bone lesion consistent with malignancy. PET scan showed bilateral pulmonary nodules and several lytic bone lesions compatible with metastatic disease. Brain MRI on 08/02/18 showed no evidence of metastatic disease.   08/02/2018 PET scan   Bilateral hypermetabolic lung nodules, LUL 1.3 cm with SUV 3.88, lingular nodule 1.4 cm SUV 3.7, central lingular nodule 1.2 cm SUV 9.76, right middle lobe nodule 1.5 cm SUV 9.9, lytic bone metastases inferior pubic ramus, sacrum, T12, right 11th rib.   08/08/2018 Procedure   Lung biopsy: metastatic carcinoma, HER-2 negative (0), ER/PR negative.   08/10/2018 -  Radiation Therapy   Palliative radiatio to the right humerus along the medial condyle   08/24/2018 - 09/05/2018 Radiation Therapy   Palliative radiation to the right 11th rib and right elbow   09/26/2018 - 12/04/2018 Chemotherapy   Carboplatin atezolizumab at [facility] with Dr. Janan Halter on [place] clinical trial stopped because of new T5  metastases (toxicities included myopathy required prednisone, immune mediated thyroiditis), right upper extremity DVT on apixaban   12/14/2018 - 04/10/2019 Chemotherapy   [place]   12/17/2018 -  Radiation Therapy   Palliative radiation to sternal, sacral &  pelvic lesions and SRS for T3 and C7-T1 lesions.   04/11/2019 -  Chemotherapy   Sacituzumab-Govitecan [name])   Bone metastases ([place])  08/07/2018 Initial Diagnosis   Bone metastases ([place])   12/14/2018 - 03/28/2019 Chemotherapy   The patient had pertuzumab (PERJETA) 420 mg in sodium chloride 0.9 % 250 mL chemo infusion, 420 mg (100 % of original dose 420 mg), Intravenous, Once, 5 of 6 cycles Dose modification: 420 mg (original dose 420 mg, Cycle 1, Reason: Provider Judgment) Administration: 420 mg (12/14/2018), 420 mg (01/10/2019), 420 mg (03/28/2019), 420 mg (01/30/2019), 420 mg (03/01/2019) trastuzumab-dkst (OGIVRI) 600 mg in sodium chloride 0.9 % 250 mL chemo infusion, 609 mg, Intravenous,  Once, 5 of 6 cycles Administration: 600 mg (12/14/2018), 450 mg (01/10/2019), 450 mg (03/28/2019), 450 mg (01/30/2019), 450 mg (03/01/2019)  for chemotherapy treatment.    12/17/2018 -  Radiation Therapy   Palliative radiation to sternal, sacral & pelvic lesions and SRS for T3 and C7-T1 lesions.     CHIEF COMPLIANT: Follow-up of metastatic breast cancer  INTERVAL HISTORY: Laura Anthony is a 44 y.o. with above-mentioned history of metastatic breast cancer who is currently awaiting approval of Olaparib.  She has intermittent pains in her bones on and off.  Some days it is much worse than the other days.  She denies any nausea or vomiting.  She has extremely poor taste and does not eat much food.  She mostly eats yogurt.  ALLERGIES:  is allergic to denosumab, statins, sumatriptan, and tape.  MEDICATIONS:  Current Outpatient Medications  Medication Sig Dispense Refill  . capecitabine (XELODA) 500 MG tablet Take 2 tablets (1,000 mg total) by mouth in AM and take 3 tablets (1,500 mg total) by mouth in PM. Take after meals. Take 14 days on, 7 days off, repeat every 21 days. 70 tablet 0  . celecoxib (CELEBREX) 100 MG capsule Take 1 capsule (100 mg total) by mouth 2 (two) times daily. 60 capsule 3   . gabapentin (NEURONTIN) 300 MG capsule TAKE 2 CAPSULES BY MOUTH AT BEDTIME 60 capsule 5  . levothyroxine (SYNTHROID) 75 MCG tablet Take 1 tablet (75 mcg total) by mouth daily before breakfast. 30 tablet 6  . lidocaine-prilocaine (EMLA) cream Apply topically daily.    Marland Kitchen liothyronine (CYTOMEL) 25 MCG tablet Take 1 tablet (25 mcg total) by mouth daily. 30 tablet 3  . lisdexamfetamine (VYVANSE) 40 MG capsule Take 1 capsule (40 mg total) by mouth every morning. 30 capsule 0  . LORazepam (ATIVAN) 0.5 MG tablet Take 1 tablet (0.5 mg total) by mouth every 6 (six) hours as needed for anxiety. 45 tablet 1  . methadone (DOLOPHINE) 10 MG tablet Take 1 tablet (10 mg total) by mouth every 8 (eight) hours. 90 tablet 0  . naloxegol oxalate (MOVANTIK) 25 MG TABS tablet Take 1 tablet (25 mg total) by mouth daily. 30 tablet 3  . naloxone (NARCAN) 4 MG/0.1ML LIQD nasal spray kit Spray once in one nostril prn overdose, may repeat x 1 (Patient not taking: Reported on 11/14/2019) 1 each 1  . olaparib (LYNPARZA) 100 MG tablet Take 2 tablets (200 mg total) by mouth 2 (two) times daily. Swallow whole. May take with food to decrease nausea and vomiting. 60 tablet 3  .  oxyCODONE-acetaminophen (PERCOCET) 10-325 MG tablet Take 1 tablet by mouth every 4 (four) hours as needed for pain. 120 tablet 0  . pantoprazole (PROTONIX) 40 MG tablet TAKE 1 TABLET BY MOUTH TWICE A DAY 180 tablet 1  . prochlorperazine (COMPAZINE) 10 MG tablet Take 1 tablet (10 mg total) by mouth every 6 (six) hours as needed (Nausea or vomiting). 30 tablet 1  . rivaroxaban (XARELTO) 20 MG TABS tablet Take 1 tablet (20 mg total) by mouth daily with supper. 30 tablet 1  . venlafaxine XR (EFFEXOR-XR) 150 MG 24 hr capsule TAKE 1 CAPSULE (150 MG TOTAL) BY MOUTH DAILY WITH BREAKFAST. 90 capsule 3  . zolpidem (AMBIEN CR) 12.5 MG CR tablet Take 1 tablet (12.5 mg total) by mouth at bedtime. 30 tablet 3   No current facility-administered medications for this visit.     PHYSICAL EXAMINATION: ECOG PERFORMANCE STATUS: 1 - Symptomatic but completely ambulatory  Vitals:   01/16/20 1029  BP: 104/65  Pulse: (!) 106  Resp: 18  Temp: 98.7 F (37.1 C)  SpO2: 97%   Filed Weights   01/16/20 1029  Weight: 155 lb 1.6 oz (70.4 kg)     LABORATORY DATA:  I have reviewed the data as listed CMP Latest Ref Rng & Units 01/16/2020 12/19/2019 11/21/2019  Glucose 70 - 99 mg/dL 122(H) 103(H) 87  BUN 6 - 20 mg/dL _0 Creatinine 0.44 - 1.00 mg/dL 0.83 0.88 0.86  Sodium 135 - 145 mmol/L 140 137 140  Potassium 3.5 - 5.1 mmol/L 4.0 3.9 4.0  Chloride 98 - 111 mmol/L 101 99 103  CO2 22 - 32 mmol/L _1 Calcium 8.9 - 10.3 mg/dL 10.4(H) 9.9 10.1  Total Protein 6.5 - 8.1 g/dL 7.5 7.4 6.8  Total Bilirubin 0.3 - 1.2 mg/dL 0.4 0.5 0.4  Alkaline Phos 38 - 126 U/L 153(H) 167(H) 120  AST 15 - 41 U/L 48(H) 41 25  ALT 0 - 44 U/L _2 Lab Results  Component Value Date   WBC 5.2 01/16/2020   HGB 10.6 (L) 01/16/2020   HCT 32.9 (L) 01/16/2020   MCV 96.2 01/16/2020   PLT 252 01/16/2020   NEUTROABS 3.6 01/16/2020    ASSESSMENT & PLAN:  Malignant neoplasm of lower-outer quadrant of left breast of female, estrogen receptor negative ([place]) 10/24/2016: Left breast biopsy 6:30 position 3 cm from nipple: IDC grade 2, DCIS, ER 0%, PR 0%, Ki-67 15% HER-2 positive ratio 2.1; 4:00 position 3 cm from nipple: IDC grade 2, DCIS, ER 0%, PR 0%, Ki-67 35%, HER-2 positive ratio 2.02 Lymph node biopsy positive  Treatment Summary: 1. Neoadjuvant chemotherapy with TCHPcompleted 02/17/2017 this would be followed by Herceptinand Perjetamaintenance for 1 yearcompleted September 2019 2.Bilateral mastectomies 03/28/2016:Bilateral mastectomies: Left mastectomy: IDC grade 2 0.9 cm, nodes negative, right mastectomy benign, ER 0%, PR 0%, HER-2 positive ratio 2.6 3.Adjuvant radiation4/08/2017 to 06/09/2017 4.Neratinib started 10/12/2017 discontinued due to diarrhea 5. Elbow  fracture: Due to metastatic disease, palliative radiation therapy 6. Carboplatin atezolizumab at [facility] on a clinical trial [facility] 043 stopped for progression 12/04/2018 7.[name] discontinued because of progression 8.Xeloda started 11/01/2019 discontinued 12/19/2019 for progression  10/29/2019: Guardant 360: ATM mutation: Benefit with olaparib ---------------------------------------------------------------------------------------------------------------- Lung nodule biopsy: Metastatic breast cancer triple negative Current treatment:Xeloda started 11/01/2019  Toxicities: 1.Fatigue:Markedly worse since she started Xeloda 2.hand-foot syndrome: Mild and we had to reduce the dosage of Xeloda  COVID-19 hospitalization 07/22/2019-07/25/2019: Covid pneumonia treated with remdesivir  Pain regimen: On methadone and Percocets I renewed her methadone prescription.  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  Metastatic disease to the thoracic spine T5 and T8 small left pleural effusion with pleural thickening.  Increased opacity left lower lobe atelectasis versus tumor  Based on presence of ATM mutation, I recommended switching her treatment to talazoparib but it was not approved. Therefore, we are trying to get Olaparib approved We dicussed about the risks and benefits of Ivermectin  I called and left a message with the prior authorization team to get olaparib approved. If he does not get approved then we will consider treatment with Doxil or Gemzar   No orders of the defined types were placed in this encounter.  The patient has a good understanding of the overall plan. she agrees with it. she will call with any problems that may develop before the next visit here.  Total time spent: 30 mins  including face to face time and time spent for planning, charting and coordination of care  Nicholas Lose, MD 01/16/2020  I, Cloyde Reams Dorshimer, am acting as scribe for Dr. Nicholas Lose.  I have reviewed the above documentation for accuracy and completeness, and I agree with the above.

## 2020-01-16 ENCOUNTER — Inpatient Hospital Stay: Payer: 59 | Attending: Hematology and Oncology | Admitting: Hematology and Oncology

## 2020-01-16 ENCOUNTER — Other Ambulatory Visit: Payer: Self-pay | Admitting: Hematology and Oncology

## 2020-01-16 ENCOUNTER — Inpatient Hospital Stay: Payer: 59

## 2020-01-16 ENCOUNTER — Other Ambulatory Visit: Payer: Self-pay

## 2020-01-16 DIAGNOSIS — C50512 Malignant neoplasm of lower-outer quadrant of left female breast: Secondary | ICD-10-CM

## 2020-01-16 DIAGNOSIS — C7800 Secondary malignant neoplasm of unspecified lung: Secondary | ICD-10-CM | POA: Diagnosis not present

## 2020-01-16 DIAGNOSIS — Z171 Estrogen receptor negative status [ER-]: Secondary | ICD-10-CM | POA: Insufficient documentation

## 2020-01-16 DIAGNOSIS — C7951 Secondary malignant neoplasm of bone: Secondary | ICD-10-CM | POA: Insufficient documentation

## 2020-01-16 DIAGNOSIS — Z923 Personal history of irradiation: Secondary | ICD-10-CM | POA: Insufficient documentation

## 2020-01-16 DIAGNOSIS — C7931 Secondary malignant neoplasm of brain: Secondary | ICD-10-CM | POA: Insufficient documentation

## 2020-01-16 DIAGNOSIS — Z9013 Acquired absence of bilateral breasts and nipples: Secondary | ICD-10-CM | POA: Insufficient documentation

## 2020-01-16 LAB — CBC WITH DIFFERENTIAL (CANCER CENTER ONLY)
Abs Immature Granulocytes: 0.02 10*3/uL (ref 0.00–0.07)
Basophils Absolute: 0.1 10*3/uL (ref 0.0–0.1)
Basophils Relative: 1 %
Eosinophils Absolute: 0.1 10*3/uL (ref 0.0–0.5)
Eosinophils Relative: 2 %
HCT: 32.9 % — ABNORMAL LOW (ref 36.0–46.0)
Hemoglobin: 10.6 g/dL — ABNORMAL LOW (ref 12.0–15.0)
Immature Granulocytes: 0 %
Lymphocytes Relative: 17 %
Lymphs Abs: 0.9 10*3/uL (ref 0.7–4.0)
MCH: 31 pg (ref 26.0–34.0)
MCHC: 32.2 g/dL (ref 30.0–36.0)
MCV: 96.2 fL (ref 80.0–100.0)
Monocytes Absolute: 0.6 10*3/uL (ref 0.1–1.0)
Monocytes Relative: 11 %
Neutro Abs: 3.6 10*3/uL (ref 1.7–7.7)
Neutrophils Relative %: 69 %
Platelet Count: 252 10*3/uL (ref 150–400)
RBC: 3.42 MIL/uL — ABNORMAL LOW (ref 3.87–5.11)
RDW: 14.8 % (ref 11.5–15.5)
WBC Count: 5.2 10*3/uL (ref 4.0–10.5)
nRBC: 0 % (ref 0.0–0.2)

## 2020-01-16 LAB — CMP (CANCER CENTER ONLY)
ALT: 30 U/L (ref 0–44)
AST: 48 U/L — ABNORMAL HIGH (ref 15–41)
Albumin: 3 g/dL — ABNORMAL LOW (ref 3.5–5.0)
Alkaline Phosphatase: 153 U/L — ABNORMAL HIGH (ref 38–126)
Anion gap: 12 (ref 5–15)
BUN: 17 mg/dL (ref 6–20)
CO2: 27 mmol/L (ref 22–32)
Calcium: 10.4 mg/dL — ABNORMAL HIGH (ref 8.9–10.3)
Chloride: 101 mmol/L (ref 98–111)
Creatinine: 0.83 mg/dL (ref 0.44–1.00)
GFR, Estimated: >60 mL/min (ref >60)
Glucose, Bld: 122 mg/dL — ABNORMAL HIGH (ref 70–99)
Potassium: 4 mmol/L (ref 3.5–5.1)
Sodium: 140 mmol/L (ref 135–145)
Total Bilirubin: 0.4 mg/dL (ref 0.3–1.2)
Total Protein: 7.5 g/dL (ref 6.5–8.1)

## 2020-01-17 ENCOUNTER — Encounter: Payer: Self-pay | Admitting: *Deleted

## 2020-01-18 ENCOUNTER — Other Ambulatory Visit: Payer: Self-pay | Admitting: Hematology and Oncology

## 2020-01-18 ENCOUNTER — Other Ambulatory Visit: Payer: Self-pay

## 2020-01-18 DIAGNOSIS — Z171 Estrogen receptor negative status [ER-]: Secondary | ICD-10-CM

## 2020-01-18 DIAGNOSIS — C50512 Malignant neoplasm of lower-outer quadrant of left female breast: Secondary | ICD-10-CM

## 2020-01-20 ENCOUNTER — Other Ambulatory Visit: Payer: Self-pay | Admitting: Hematology and Oncology

## 2020-01-20 DIAGNOSIS — F5101 Primary insomnia: Secondary | ICD-10-CM

## 2020-01-20 MED ORDER — LISDEXAMFETAMINE DIMESYLATE 40 MG PO CAPS: 40.0000 mg | ORAL_CAPSULE | ORAL | 0 refills | Status: DC

## 2020-01-20 MED ORDER — ZOLPIDEM TARTRATE ER 12.5 MG PO TBCR: 12.5000 mg | EXTENDED_RELEASE_TABLET | Freq: Every day | ORAL | 3 refills | Status: DC

## 2020-01-20 MED ORDER — METHADONE HCL 10 MG PO TABS: 10.0000 mg | ORAL_TABLET | Freq: Three times a day (TID) | ORAL | 0 refills | Status: DC

## 2020-01-20 NOTE — Telephone Encounter (Signed)
Dr. Gudena, For your approval or refusal. Udell Mazzocco M Chattie Greeson, RN  

## 2020-01-21 ENCOUNTER — Telehealth: Payer: Self-pay | Admitting: Hematology and Oncology

## 2020-01-21 MED ORDER — LISDEXAMFETAMINE DIMESYLATE 40 MG PO CAPS: 40.0000 mg | ORAL_CAPSULE | ORAL | 0 refills | Status: DC

## 2020-01-21 NOTE — Telephone Encounter (Signed)
Can you please "refuse" this medication.  It is a controlled substance and was filled by you yesterday but it will not let me clear it out of the que due to it being a controlled substance. Thanks

## 2020-01-21 NOTE — Telephone Encounter (Signed)
No 12/16 los, no changes made to pt schedule 

## 2020-01-22 ENCOUNTER — Other Ambulatory Visit: Payer: Self-pay | Admitting: *Deleted

## 2020-01-22 DIAGNOSIS — C7931 Secondary malignant neoplasm of brain: Secondary | ICD-10-CM

## 2020-01-23 MED ORDER — TALAZOPARIB TOSYLATE 1 MG PO CAPS
1.0000 mg | ORAL_CAPSULE | Freq: Every day | ORAL | 3 refills | Status: DC
Start: 2020-01-23 — End: 2020-02-28

## 2020-01-23 NOTE — Telephone Encounter (Signed)
Oral Oncology Pharmacist Encounter   Notified that patient coverage for Talzenna (talazoparib) was approved. Patient will have $0 copay for first fill of Talzenna.  Dr. Lindi Adie notified. Medication list updated to remove Lonie Peak since patient will be proceeding with treatment with Talzenna at this time.   Leron Croak, PharmD, BCPS Hematology/Oncology Clinical Pharmacist [facility] [phone] 01/23/2020 2:22 PM

## 2020-01-23 NOTE — Telephone Encounter (Signed)
Oral Oncology Pharmacist Encounter   2nd level appeal for Lynparza (olaparib) has been denied.    Dr. Lindi Adie notified of determination.    Oral Oncology Clinic will continue to follow.    Leron Croak, PharmD, BCPS Hematology/Oncology Clinical Pharmacist [facility] [phone] 01/23/2020 11:06 AM

## 2020-01-23 NOTE — Telephone Encounter (Signed)
Oral Chemotherapy Pharmacist Encounter   Attempted to reach patient to provide update and offer for initial counseling on oral medication: Talzenna (talazoparib).   No answer. Left voicemail for patient to call back to discuss details of medication acquisition and initial counseling session.  Leron Croak, PharmD, BCPS Hematology/Oncology Clinical Pharmacist [facility] [phone] 01/23/2020 2:31 PM

## 2020-01-27 ENCOUNTER — Telehealth: Payer: Self-pay | Admitting: Hematology and Oncology

## 2020-01-27 NOTE — Telephone Encounter (Signed)
Scheduled follow-up appointment per 12/27 schedule message. Patient is aware. 

## 2020-01-27 NOTE — Telephone Encounter (Signed)
Rescheduled appointment due to scheduling error. Patient is aware of changes. 

## 2020-01-27 NOTE — Telephone Encounter (Signed)
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   Pt is doing well. Counseled patient on administration, dosing, side effects, monitoring, drug-food interactions, safe handling, storage, and disposal.  Patient will take Talzenna 5m capsule, 1 capsule (134m by mouth once daily without regard to food.  Talzenna start date: 01/27/20  Side effects include but not limited to: fatigue, headache, nausea, vomiting, decreased blood counts, diarrhea, hyperglycemia    Myelodysplastic syndrome/acute myeloid leukemia (MDS/AML) have been reported (rarely) in clinical trials.  Patient has anti-emetic on hand and knows to take it if nausea develops.   Patient will obtain anti diarrheal and alert the office of 4 or more loose stools above baseline.  Reviewed with patient importance of keeping a medication schedule and plan for any missed doses. No barriers to medication adherence identified.  Medication reconciliation performed and medication/allergy list updated.   Ms. CuPennyoiced understanding and appreciation. All questions answered.  [facility]ided [facility]. Patient knows to call the office with questions or concerns. Oral Chemotherapy Navigation Clinic will continue to follow.  ReLeron CroakPharmD, BCPS Hematology/Oncology Clinical Pharmacist [phone]/27/2021 1:26 PM

## 2020-01-28 ENCOUNTER — Other Ambulatory Visit: Payer: Self-pay | Admitting: Hematology and Oncology

## 2020-02-10 ENCOUNTER — Encounter: Payer: Self-pay | Admitting: *Deleted

## 2020-02-13 ENCOUNTER — Telehealth: Payer: Self-pay | Admitting: *Deleted

## 2020-02-13 ENCOUNTER — Other Ambulatory Visit: Payer: Self-pay

## 2020-02-13 DIAGNOSIS — C50512 Malignant neoplasm of lower-outer quadrant of left female breast: Secondary | ICD-10-CM

## 2020-02-13 DIAGNOSIS — Z171 Estrogen receptor negative status [ER-]: Secondary | ICD-10-CM

## 2020-02-13 NOTE — Progress Notes (Signed)
Erroneous encounter

## 2020-02-13 NOTE — Telephone Encounter (Signed)
"  Phineas Real, [place] [facility] [phone] ext. [phone]).  Looking for a prior authorization for [employer] 12[birth year]30[birth year].  Please call to initiate prior authorization for one of her specialty medications which needs approval before next dispense can happen."

## 2020-02-14 ENCOUNTER — Telehealth: Payer: Self-pay | Admitting: Pharmacist

## 2020-02-14 ENCOUNTER — Other Ambulatory Visit: Payer: 59

## 2020-02-14 ENCOUNTER — Other Ambulatory Visit: Payer: Self-pay

## 2020-02-14 ENCOUNTER — Inpatient Hospital Stay (HOSPITAL_BASED_OUTPATIENT_CLINIC_OR_DEPARTMENT_OTHER): Payer: 59 | Admitting: Medical

## 2020-02-14 ENCOUNTER — Ambulatory Visit: Payer: 59 | Admitting: Hematology and Oncology

## 2020-02-14 ENCOUNTER — Ambulatory Visit (HOSPITAL_COMMUNITY)
Admission: RE | Admit: 2020-02-14 | Discharge: 2020-02-14 | Disposition: A | Discharge status: Home / Self Care | Payer: 59 | Source: Ambulatory Visit | Attending: Medical | Admitting: Medical

## 2020-02-14 ENCOUNTER — Inpatient Hospital Stay: Payer: 59 | Attending: Hematology and Oncology

## 2020-02-14 DIAGNOSIS — Z806 Family history of leukemia: Secondary | ICD-10-CM | POA: Diagnosis not present

## 2020-02-14 DIAGNOSIS — C7802 Secondary malignant neoplasm of left lung: Secondary | ICD-10-CM | POA: Diagnosis not present

## 2020-02-14 DIAGNOSIS — C7951 Secondary malignant neoplasm of bone: Secondary | ICD-10-CM | POA: Diagnosis not present

## 2020-02-14 DIAGNOSIS — Z171 Estrogen receptor negative status [ER-]: Secondary | ICD-10-CM | POA: Diagnosis present

## 2020-02-14 DIAGNOSIS — Z8249 Family history of ischemic heart disease and other diseases of the circulatory system: Secondary | ICD-10-CM | POA: Diagnosis not present

## 2020-02-14 DIAGNOSIS — C7931 Secondary malignant neoplasm of brain: Secondary | ICD-10-CM | POA: Diagnosis not present

## 2020-02-14 DIAGNOSIS — C50512 Malignant neoplasm of lower-outer quadrant of left female breast: Secondary | ICD-10-CM

## 2020-02-14 DIAGNOSIS — Z836 Family history of other diseases of the respiratory system: Secondary | ICD-10-CM | POA: Diagnosis not present

## 2020-02-14 DIAGNOSIS — Z888 Allergy status to other drugs, medicaments and biological substances status: Secondary | ICD-10-CM | POA: Diagnosis not present

## 2020-02-14 DIAGNOSIS — Z803 Family history of malignant neoplasm of breast: Secondary | ICD-10-CM | POA: Insufficient documentation

## 2020-02-14 DIAGNOSIS — Z8042 Family history of malignant neoplasm of prostate: Secondary | ICD-10-CM | POA: Insufficient documentation

## 2020-02-14 DIAGNOSIS — R0602 Shortness of breath: Secondary | ICD-10-CM | POA: Insufficient documentation

## 2020-02-14 DIAGNOSIS — R29818 Other symptoms and signs involving the nervous system: Secondary | ICD-10-CM | POA: Diagnosis not present

## 2020-02-14 DIAGNOSIS — Z801 Family history of malignant neoplasm of trachea, bronchus and lung: Secondary | ICD-10-CM | POA: Diagnosis not present

## 2020-02-14 DIAGNOSIS — R32 Unspecified urinary incontinence: Secondary | ICD-10-CM | POA: Insufficient documentation

## 2020-02-14 DIAGNOSIS — Z79899 Other long term (current) drug therapy: Secondary | ICD-10-CM | POA: Diagnosis not present

## 2020-02-14 DIAGNOSIS — D696 Thrombocytopenia, unspecified: Secondary | ICD-10-CM | POA: Diagnosis not present

## 2020-02-14 DIAGNOSIS — Z86718 Personal history of other venous thrombosis and embolism: Secondary | ICD-10-CM | POA: Insufficient documentation

## 2020-02-14 DIAGNOSIS — Z7901 Long term (current) use of anticoagulants: Secondary | ICD-10-CM | POA: Insufficient documentation

## 2020-02-14 LAB — CBC WITH DIFFERENTIAL (CANCER CENTER ONLY)
Abs Immature Granulocytes: 0.02 10*3/uL (ref 0.00–0.07)
Basophils Absolute: 0 10*3/uL (ref 0.0–0.1)
Basophils Relative: 1 %
Eosinophils Absolute: 0.1 10*3/uL (ref 0.0–0.5)
Eosinophils Relative: 1 %
HCT: 31.9 % — ABNORMAL LOW (ref 36.0–46.0)
Hemoglobin: 10.4 g/dL — ABNORMAL LOW (ref 12.0–15.0)
Immature Granulocytes: 0 %
Lymphocytes Relative: 15 %
Lymphs Abs: 0.9 10*3/uL (ref 0.7–4.0)
MCH: 28.7 pg (ref 26.0–34.0)
MCHC: 32.6 g/dL (ref 30.0–36.0)
MCV: 87.9 fL (ref 80.0–100.0)
Monocytes Absolute: 0.4 10*3/uL (ref 0.1–1.0)
Monocytes Relative: 7 %
Neutro Abs: 4.8 10*3/uL (ref 1.7–7.7)
Neutrophils Relative %: 76 %
Platelet Count: 48 10*3/uL — ABNORMAL LOW (ref 150–400)
RBC: 3.63 MIL/uL — ABNORMAL LOW (ref 3.87–5.11)
RDW: 16.1 % — ABNORMAL HIGH (ref 11.5–15.5)
WBC Count: 6.3 10*3/uL (ref 4.0–10.5)
nRBC: 0 % (ref 0.0–0.2)

## 2020-02-14 LAB — CMP (CANCER CENTER ONLY)
ALT: 68 U/L — ABNORMAL HIGH (ref 0–44)
AST: 79 U/L — ABNORMAL HIGH (ref 15–41)
Albumin: 3.2 g/dL — ABNORMAL LOW (ref 3.5–5.0)
Alkaline Phosphatase: 420 U/L — ABNORMAL HIGH (ref 38–126)
Anion gap: 11 (ref 5–15)
BUN: 20 mg/dL (ref 6–20)
CO2: 28 mmol/L (ref 22–32)
Calcium: 10.4 mg/dL — ABNORMAL HIGH (ref 8.9–10.3)
Chloride: 101 mmol/L (ref 98–111)
Creatinine: 0.82 mg/dL (ref 0.44–1.00)
GFR, Estimated: >60 mL/min (ref >60)
Glucose, Bld: 101 mg/dL — ABNORMAL HIGH (ref 70–99)
Potassium: 4.4 mmol/L (ref 3.5–5.1)
Sodium: 140 mmol/L (ref 135–145)
Total Bilirubin: 0.6 mg/dL (ref 0.3–1.2)
Total Protein: 8 g/dL (ref 6.5–8.1)

## 2020-02-14 LAB — D-DIMER, QUANTITATIVE: D-Dimer, Quant: 1.3 ug/mL-FEU — ABNORMAL HIGH (ref 0.00–0.50)

## 2020-02-14 MED ORDER — ALBUTEROL SULFATE HFA 108 (90 BASE) MCG/ACT IN AERS: 1.0000 | INHALATION_SPRAY | Freq: Four times a day (QID) | RESPIRATORY_TRACT | 6 refills | Status: AC | PRN

## 2020-02-14 MED ORDER — FLOVENT HFA 110 MCG/ACT IN AERO: 2.0000 | INHALATION_SPRAY | Freq: Two times a day (BID) | RESPIRATORY_TRACT | 12 refills | Status: DC

## 2020-02-14 NOTE — Assessment & Plan Note (Signed)
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 Neoadjuvant chemotherapy with TCHPcompleted 02/17/2017 this would be followed by Herceptinand Perjetamaintenance for 1 yearcompleted September 2019 2.Bilateral mastectomies 03/28/2016:Bilateral mastectomies: Left mastectomy: IDC grade 2 0.9 cm, nodes negative, right mastectomy benign, ER 0%, PR 0%, HER-2 positive ratio 2.6 3.Adjuvant radiation4/08/2017 to 06/09/2017 4.Neratinib started 10/12/2017 discontinued due to diarrhea 5. Elbow fracture: Due to metastatic disease, palliative radiation therapy 6. Carboplatin atezolizumab at [facility] on a clinical trial [facility] 043 stopped for progression 12/04/2018 7.Trodelvy discontinued because of progression 8.Xeloda started 10/1/2021discontinued 12/19/2019 for progression  10/29/2019: Guardant 360: ATM mutation: Benefit with olaparib ---------------------------------------------------------------------------------------------------------------- Lung nodule biopsy: Metastatic breast cancer triple negative Current treatment:Xeloda started 11/01/2019  Toxicities: 1.Fatigue:Markedly worse since she started Xeloda 2.hand-foot syndrome: Mild and we had to reduce the dosage of Xeloda  COVID-19 hospitalization 07/22/2019-07/25/2019: Covid pneumonia treated with remdesivir  Pain regimen: On methadone and Percocets I renewed her methadone prescription.  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 Metastatic disease to the thoracic spine T5 and T8 small left pleural effusion  with pleural thickening. Increased opacity left lower lobe atelectasis versus tumor ------------------------------------------------------------------------------------------------------------------------------------- Current treatment: Talazoparib started Talazoparib toxicities: 

## 2020-02-14 NOTE — Telephone Encounter (Signed)
Oral Oncology Pharmacist Encounter   Received forwarded call from [place] at [place] requesting prior authorization to be completed for Lynparza (olaparib) for patient.   Left voicemail for representative at [phone] ext. [phone] that patient is not currently taking Lonie Peak and that prior authorization will not need to be completed. Provided Oral Chemotherapy Navigation Clinic phone number if any additional information is needed.   Leron Croak, PharmD, BCPS Hematology/Oncology Clinical Pharmacist [facility] [phone] 02/14/2020 8:38 AM

## 2020-02-15 MED ORDER — OXYCODONE-ACETAMINOPHEN 10-325 MG PO TABS: 1.0000 | ORAL_TABLET | ORAL | 0 refills | Status: DC | PRN

## 2020-02-16 ENCOUNTER — Other Ambulatory Visit: Payer: Self-pay | Admitting: Medical

## 2020-02-16 DIAGNOSIS — C50512 Malignant neoplasm of lower-outer quadrant of left female breast: Secondary | ICD-10-CM

## 2020-02-16 DIAGNOSIS — Z171 Estrogen receptor negative status [ER-]: Secondary | ICD-10-CM

## 2020-02-16 MED ORDER — GABAPENTIN 300 MG PO CAPS: ORAL_CAPSULE | ORAL | 5 refills | Status: AC

## 2020-02-16 MED ORDER — AMOXICILLIN-POT CLAVULANATE 875-125 MG PO TABS: ORAL_TABLET | ORAL | 0 refills | Status: DC

## 2020-02-16 MED ORDER — LISDEXAMFETAMINE DIMESYLATE 40 MG PO CAPS: 40.0000 mg | ORAL_CAPSULE | ORAL | 0 refills | Status: DC

## 2020-02-17 ENCOUNTER — Encounter: Payer: Self-pay | Admitting: *Deleted

## 2020-02-17 NOTE — Progress Notes (Signed)
[facility] Progress Note   Laura Anthony [id number] 31-Dec-1975 45 y.o.  Laura Anthony is managed by Dr. Nicholas Lose  Actively treated with chemotherapy/immunotherapy/hormonal therapy: yes  Current therapy: Talzenna 1 mg PO QDay  Next scheduled appointment with provider: to be scheduled  Assessment: Plan:    Shortness of breath - Plan: D-dimer, quantitative  Malignant neoplasm of lower-outer quadrant of left breast of female, estrogen receptor negative ([place]) - Plan: D-dimer, quantitative   Shortness of breath: Laura Anthony was referred for a chest x-ray which returned as follows:  FINDINGS: Port-A-Cath tip is in the superior vena cava. No pneumothorax. There is a small pleural effusion on the left with scarring left lower lobe region. No edema or consolidation. Multiple nodular metastatic lesions are noted throughout the lung parenchyma, largest on the left measuring approximately 1.8 cm. Heart size and pulmonary vascularity are normal. No appreciable adenopathy. No bone lesions. Postoperative change left breast.  IMPRESSION: Multiple metastatic foci throughout the lungs. Scarring left base with small left pleural effusion. No frank edema or consolidation. Heart size normal. No appreciable adenopathy. Port-A-Cath tip in superior vena cava. No evident pneumothorax.  A D-dimer was ordered given her history of blood clots: She continues on Xarelto daily.  She is compliant and has not missed any doses.  Her D-dimer slightly elevated at 1.30.  She was given a prescription for both an albuterol inhaler and a Flovent inhaler.   ER negative malignant neoplasm of the left breast: Laura Anthony continues to be managed by Dr. Payton Mccallum and continues on Talzenna 1 mg PO QDay.  She is pending a follow-up brain MRI and is scheduled to be presented at an upcoming breast cancer tumor board.  No follow-up appointment has yet been scheduled for  her.   Please see After Visit Summary for patient specific instructions.  Future Appointments  Date Time Provider [place]  02/27/2020  9:30 AM GI-315 MR 3 [facility]-315MRI [facility]  03/02/2020  7:00 AM [facility] CONFERENCE [facility] None  03/02/2020  1:30 PM Hayden Pedro, PA-C [facility] None    Orders Placed This Encounter  Procedures  . D-dimer, quantitative       Subjective:   Patient ID:  Laura Anthony is a 45 y.o. (DOB 1975-02-29) female.  Chief Complaint: No chief complaint on file.   HPI Laura Anthony is a 45 y.o. female with a diagnosis of an ER negative malignant neoplasm of the left breast.  She continues to be managed by Dr. Nicholas Lose and is currently treated with Talzenna 1 mg PO QDay.  She has a history of a DVT of her lower extremity.  She continues on Xarelto.  She is compliant with this medication.  She presents to the clinic today with shortness of breath.  She reports that her shortness of breath developed since starting on Talzenna.  She had a chest x-ray completed earlier today and reports that she became winded when she stood up and raised her arms for her chest x-ray.  Her oxygen saturation was within normal range today at 99% on room air.  Her pulse was 92 but increased to 105 when she stood although her oxygen level dropped no lower than 96%.  She reports that she becomes significantly out of breath with any activities.  She denies fevers, chills, or sweats.  Her chest x-ray from today returned showing:  FINDINGS: Port-A-Cath tip is in the superior vena cava. No pneumothorax. There is a  small pleural effusion on the left with scarring left lower lobe region. No edema or consolidation. Multiple nodular metastatic lesions are noted throughout the lung parenchyma, largest on the left measuring approximately 1.8 cm. Heart size and pulmonary vascularity are normal. No appreciable adenopathy. No bone  lesions. Postoperative change left breast.  IMPRESSION: Multiple metastatic foci throughout the lungs. Scarring left base with small left pleural effusion. No frank edema or consolidation. Heart size normal. No appreciable adenopathy. Port-A-Cath tip in superior vena cava. No evident pneumothorax.   Medications: I have reviewed the patient's current medications.  Allergies:  Allergies  Allergen Reactions  . Denosumab Other (See Comments)    Osteonecrosis of the jaw  . Statins Other (See Comments)    Leg pain  . Sumatriptan Other (See Comments)    Numbness to face   . Tape Rash    Rash from dressing over port-a-cath     Past Medical History:  Diagnosis Date  . ADD (attention deficit disorder)   . Anemia   . Anxiety   . Breast cancer, left breast ([place])    S/P mastectomy 03/27/2017  . DVT (deep venous thrombosis) ([place]) 2017   calf left - probably due to [facility] pills-took eliquis x3 mos, nonthing now  . High cholesterol   . Impingement syndrome of right shoulder 07/2013  . Migraine    "usually 1/month" (03/28/2017)  . PONV (postoperative nausea and vomiting)   . Right bicipital tenosynovitis 07/2013  . Rotator cuff impingement syndrome of right shoulder 07/12/2013  . Seizures ([place])    x 1 as a child - was never on anticonvulsants (03/28/2017)    Past Surgical History:  Procedure Laterality Date  . ADENOIDECTOMY  1981  . ANKLE ARTHROSCOPY Right   . BREAST BIOPSY Left 10/2016  . KNEE ARTHROSCOPY Right   . KNEE ARTHROSCOPY W/ ACL RECONSTRUCTION Left   . LIPOSUCTION WITH LIPOFILLING Bilateral 01/05/2018   Procedure: LIPOFILLING FROM ABDOMEN TO BILATERAL CHEST;  Surgeon: Irene Limbo, MD;  Location: [place];  Service: Plastics;  Laterality: Bilateral;  . MASTECTOMY Left 03/27/2017   NIPPLE SPARING MASTECTOMY WITH RADIOACTIVE SEED TARGETED LYMPH NODE EXCISION AND LEFT AXILLARY SENTINEL LYMPH NODE BIOPSY  . MASTECTOMY Right 03/27/2017   RIGHT PROPHYLACTIC  NIPPLE SPARING MASTECTOMY  . NIPPLE SPARING MASTECTOMY Right 03/27/2017   Procedure: RIGHT PROPHYLACTIC NIPPLE SPARING MASTECTOMY;  Surgeon: Rolm Bookbinder, MD;  Location: [place];  Service: General;  Laterality: Right;  . PORT-A-CATH REMOVAL Right 01/05/2018   Procedure: REMOVAL RIGHT CHEST PORT;  Surgeon: Irene Limbo, MD;  Location: [place];  Service: Plastics;  Laterality: Right;  . PORTACATH PLACEMENT N/A 11/01/2016   Procedure: INSERTION PORT-A-CATH WITH [place];  Surgeon: Rolm Bookbinder, MD;  Location: [place];  Service: General;  Laterality: N/A;  . PORTACATH PLACEMENT N/A 08/23/2018   Procedure: INSERTION PORT-A-CATH WITH ULTRASOUND;  Surgeon: Rolm Bookbinder, MD;  Location: [place];  Service: General;  Laterality: N/A;  . RADIOACTIVE SEED GUIDED AXILLARY SENTINEL LYMPH NODE Left 03/27/2017   Procedure: LEFT NIPPLE SPARING MASTECTOMY WITH RADIOACTIVE SEED TARGETED LYMPH NODE EXCISION AND LEFT AXILLARY SENTINEL LYMPH NODE BIOPSY;  Surgeon: Rolm Bookbinder, MD;  Location: [place];  Service: General;  Laterality: Left;  REQUESTS RNFA  . RECONSTRUCTION BREAST IMMEDIATE / DELAYED W/ TISSUE EXPANDER Bilateral 03/27/2017   BILATERAL BREAST RECONSTRUCTION WITH PLACEMENT OF TISSUE EXPANDER AND ALLODERM  . REMOVAL OF BILATERAL TISSUE EXPANDERS WITH PLACEMENT OF BILATERAL BREAST IMPLANTS  Bilateral 01/05/2018   Procedure: REMOVAL OF BILATERAL TISSUE EXPANDERS WITH PLACEMENT OF BILATERAL BREAST IMPLANTS;  Surgeon: Irene Limbo, MD;  Location: [place];  Service: Plastics;  Laterality: Bilateral;  . SEPTOPLASTY WITH ETHMOIDECTOMY, AND MAXILLARY ANTROSTOMY  10/29/2010   bilat. max. antrostomy with left max. stripping; left ant. ethmoidectomy; right total ethmoidectomy; sphenoidotomy  . SHOULDER ARTHROSCOPY WITH SUBACROMIAL DECOMPRESSION AND BICEP TENDON REPAIR Right 07/12/2013   Procedure: RIGHT SHOULDER  ARTHROSCOPY DEBRIDEMENT EXTENSIVE DECOMPRESSION SUBACROMIAL PARTIAL ACROMIOPLASTY;  Surgeon: Johnny Bridge, MD;  Location: [place];  Service: Orthopedics;  Laterality: Right;  . WRIST ARTHROSCOPY  01/17/2012   Procedure: ARTHROSCOPY WRIST; right wrist Surgeon: Tennis Must, MD;  Location: [place];  Service: Orthopedics;  Laterality: Right;  RIGHT WRIST ARTHROSCOPY WITH TRIANGULAR FIBROCARTILAGE COMPLEX REPAIR AND DEBRIDEMENT     Family History  Problem Relation Age of Onset  . Breast cancer Mother 83       triple negative  . Leukemia Father   . Lung cancer Father   . Heart attack Maternal Uncle   . Prostate cancer Paternal Uncle   . COPD Paternal Grandmother   . Heart disease Paternal Grandfather   . Prostate cancer Paternal Uncle   . Leukemia Cousin     Social History   Socioeconomic History  . Marital status: Married    Spouse name: Not on file  . Number of children: Not on file  . Years of education: Not on file  . Highest education level: Not on file  Occupational History  . Not on file  Tobacco Use  . Smoking status: Never Smoker  . Smokeless tobacco: Never Used  Vaping Use  . Vaping Use: Never used  Substance and Sexual Activity  . Alcohol use: Yes    Comment: Drinks very rare  . Drug use: No  . Sexual activity: Yes    Birth control/protection: Surgical    Comment: husband has had a vasectomy  Other Topics Concern  . Not on file  Social History Narrative  . Not on file   Social Determinants of Health   Financial Resource Strain: Not on file  Food Insecurity: Not on file  Transportation Needs: Not on file  Physical Activity: Not on file  Stress: Not on file  Social Connections: Not on file  Intimate Partner Violence: Not on file    Past Medical History, Surgical history, Social history, and Family history were reviewed and updated as appropriate.   Please see review of systems for further details on the patient's  review from today.   Review of Systems:  Review of Systems  Constitutional: Negative for chills, diaphoresis and fever.  HENT: Negative for trouble swallowing and voice change.   Respiratory: Positive for shortness of breath. Negative for cough, choking, chest tightness, wheezing and stridor.   Cardiovascular: Negative for chest pain and palpitations.  Gastrointestinal: Negative for abdominal pain, constipation, diarrhea, nausea and vomiting.  Musculoskeletal: Negative for back pain and myalgias.  Neurological: Negative for dizziness, light-headedness and headaches.    Objective:   Physical Exam:  There were no vitals taken for this visit. ECOG: 1  Physical Exam Constitutional:      General: She is not in acute distress.    Appearance: She is not diaphoretic.  HENT:     Head: Normocephalic and atraumatic.     Right Ear: External ear normal.     Left Ear: External ear normal.     Mouth/Throat:  Mouth: Oropharynx is clear and moist.     Pharynx: No oropharyngeal exudate.  Eyes:     General: No scleral icterus.       Right eye: No discharge.        Left eye: No discharge.     Conjunctiva/sclera: Conjunctivae normal.  Cardiovascular:     Rate and Rhythm: Normal rate and regular rhythm.     Heart sounds: Normal heart sounds. No murmur heard. No friction rub. No gallop.   Pulmonary:     Effort: Pulmonary effort is normal. No respiratory distress.     Breath sounds: Normal breath sounds. No wheezing or rales.  Musculoskeletal:     Cervical back: Normal range of motion and neck supple.  Lymphadenopathy:     Cervical: No cervical adenopathy.  Skin:    General: Skin is warm and dry.     Findings: No erythema or rash.  Neurological:     Mental Status: She is alert.     Coordination: Coordination normal.     Gait: Gait abnormal (The patient is ambulating with the use of a rolling walker.).  Psychiatric:        Mood and Affect: Mood and affect normal.        Behavior:  Behavior normal.        Thought Content: Thought content normal.        Judgment: Judgment normal.     Lab Review:     Component Value Date/Time   NA 140 02/14/2020 1352   NA 138 01/27/2017 0802   K 4.4 02/14/2020 1352   K 4.1 01/27/2017 0802   CL 101 02/14/2020 1352   CO2 28 02/14/2020 1352   CO2 26 01/27/2017 0802   GLUCOSE 101 (H) 02/14/2020 1352   GLUCOSE 90 01/27/2017 0802   BUN 20 02/14/2020 1352   BUN 14.0 01/27/2017 0802   CREATININE 0.82 02/14/2020 1352   CREATININE 0.7 01/27/2017 0802   CALCIUM 10.4 (H) 02/14/2020 1352   CALCIUM 8.6 01/27/2017 0802   PROT 8.0 02/14/2020 1352   PROT 6.5 01/27/2017 0802   ALBUMIN 3.2 (L) 02/14/2020 1352   ALBUMIN 3.5 01/27/2017 0802   AST 79 (H) 02/14/2020 1352   AST 22 01/27/2017 0802   ALT 68 (H) 02/14/2020 1352   ALT 23 01/27/2017 0802   ALKPHOS 420 (H) 02/14/2020 1352   ALKPHOS 85 01/27/2017 0802   BILITOT 0.6 02/14/2020 1352   BILITOT 0.23 01/27/2017 0802   GFRNONAA >60 02/14/2020 1352   GFRAA >60 10/24/2019 0805       Component Value Date/Time   WBC 6.3 02/14/2020 1352   WBC 2.6 (L) 07/25/2019 0519   RBC 3.63 (L) 02/14/2020 1352   HGB 10.4 (L) 02/14/2020 1352   HGB 11.1 (L) 01/27/2017 0802   HCT 31.9 (L) 02/14/2020 1352   HCT 33.4 (L) 01/27/2017 0802   PLT 48 (L) 02/14/2020 1352   PLT 170 01/27/2017 0802   MCV 87.9 02/14/2020 1352   MCV 99.0 01/27/2017 0802   MCH 28.7 02/14/2020 1352   MCHC 32.6 02/14/2020 1352   RDW 16.1 (H) 02/14/2020 1352   RDW 18.2 (H) 01/27/2017 0802   LYMPHSABS 0.9 02/14/2020 1352   LYMPHSABS 1.7 01/27/2017 0802   MONOABS 0.4 02/14/2020 1352   MONOABS 0.5 01/27/2017 0802   EOSABS 0.1 02/14/2020 1352   EOSABS 0.0 01/27/2017 0802   BASOSABS 0.0 02/14/2020 1352   BASOSABS 0.0 01/27/2017 0802   -------------------------------  Imaging from last 24 hours (if  applicable):  Radiology interpretation: DG Chest 2 View  Result Date: 02/14/2020 CLINICAL DATA:  Shortness of breath.   History of breast carcinoma EXAM: CHEST - 2 VIEW COMPARISON:  December 03, 2019 chest radiograph; chest CT December 18, 2019 FINDINGS: Port-A-Cath tip is in the superior vena cava. No pneumothorax. There is a small pleural effusion on the left with scarring left lower lobe region. No edema or consolidation. Multiple nodular metastatic lesions are noted throughout the lung parenchyma, largest on the left measuring approximately 1.8 cm. Heart size and pulmonary vascularity are normal. No appreciable adenopathy. No bone lesions. Postoperative change left breast. IMPRESSION: Multiple metastatic foci throughout the lungs. Scarring left base with small left pleural effusion. No frank edema or consolidation. Heart size normal. No appreciable adenopathy. Port-A-Cath tip in superior vena cava. No evident pneumothorax. Electronically Signed   By: Lowella Grip III M.D.   On: 02/14/2020 14:32

## 2020-02-18 ENCOUNTER — Other Ambulatory Visit: Payer: Self-pay | Admitting: Medical

## 2020-02-18 ENCOUNTER — Other Ambulatory Visit: Payer: Self-pay

## 2020-02-18 ENCOUNTER — Inpatient Hospital Stay (HOSPITAL_BASED_OUTPATIENT_CLINIC_OR_DEPARTMENT_OTHER): Payer: 59 | Admitting: Medical

## 2020-02-18 ENCOUNTER — Inpatient Hospital Stay: Payer: 59

## 2020-02-18 VITALS — BP 105/71 | HR 103 | Temp 97.5°F | Resp 20 | Ht 67.0 in | Wt 145.8 lb

## 2020-02-18 DIAGNOSIS — R0602 Shortness of breath: Secondary | ICD-10-CM

## 2020-02-18 DIAGNOSIS — Z171 Estrogen receptor negative status [ER-]: Secondary | ICD-10-CM

## 2020-02-18 DIAGNOSIS — D696 Thrombocytopenia, unspecified: Secondary | ICD-10-CM

## 2020-02-18 DIAGNOSIS — C7951 Secondary malignant neoplasm of bone: Secondary | ICD-10-CM | POA: Diagnosis not present

## 2020-02-18 DIAGNOSIS — C50919 Malignant neoplasm of unspecified site of unspecified female breast: Secondary | ICD-10-CM

## 2020-02-18 DIAGNOSIS — C50512 Malignant neoplasm of lower-outer quadrant of left female breast: Secondary | ICD-10-CM | POA: Diagnosis not present

## 2020-02-18 LAB — CBC WITH DIFFERENTIAL (CANCER CENTER ONLY)
Abs Immature Granulocytes: 0.01 10*3/uL (ref 0.00–0.07)
Basophils Absolute: 0 10*3/uL (ref 0.0–0.1)
Basophils Relative: 1 %
Eosinophils Absolute: 0.1 10*3/uL (ref 0.0–0.5)
Eosinophils Relative: 2 %
HCT: 27.1 % — ABNORMAL LOW (ref 36.0–46.0)
Hemoglobin: 8.8 g/dL — ABNORMAL LOW (ref 12.0–15.0)
Immature Granulocytes: 0 %
Lymphocytes Relative: 18 %
Lymphs Abs: 0.9 10*3/uL (ref 0.7–4.0)
MCH: 28.6 pg (ref 26.0–34.0)
MCHC: 32.5 g/dL (ref 30.0–36.0)
MCV: 88 fL (ref 80.0–100.0)
Monocytes Absolute: 0.5 10*3/uL (ref 0.1–1.0)
Monocytes Relative: 10 %
Neutro Abs: 3.6 10*3/uL (ref 1.7–7.7)
Neutrophils Relative %: 69 %
Platelet Count: 33 10*3/uL — ABNORMAL LOW (ref 150–400)
RBC: 3.08 MIL/uL — ABNORMAL LOW (ref 3.87–5.11)
RDW: 16.8 % — ABNORMAL HIGH (ref 11.5–15.5)
WBC Count: 5.2 10*3/uL (ref 4.0–10.5)
nRBC: 0 % (ref 0.0–0.2)

## 2020-02-18 LAB — CMP (CANCER CENTER ONLY)
ALT: 42 U/L (ref 0–44)
AST: 46 U/L — ABNORMAL HIGH (ref 15–41)
Albumin: 3.2 g/dL — ABNORMAL LOW (ref 3.5–5.0)
Alkaline Phosphatase: 298 U/L — ABNORMAL HIGH (ref 38–126)
Anion gap: 10 (ref 5–15)
BUN: 21 mg/dL — ABNORMAL HIGH (ref 6–20)
CO2: 29 mmol/L (ref 22–32)
Calcium: 9.9 mg/dL (ref 8.9–10.3)
Chloride: 98 mmol/L (ref 98–111)
Creatinine: 0.83 mg/dL (ref 0.44–1.00)
GFR, Estimated: >60 mL/min (ref >60)
Glucose, Bld: 107 mg/dL — ABNORMAL HIGH (ref 70–99)
Potassium: 3.8 mmol/L (ref 3.5–5.1)
Sodium: 137 mmol/L (ref 135–145)
Total Bilirubin: 0.7 mg/dL (ref 0.3–1.2)
Total Protein: 7.5 g/dL (ref 6.5–8.1)

## 2020-02-20 NOTE — Progress Notes (Signed)
[facility] Progress Note   Laura Anthony [id number] [birth year] 45 y.o.  Laura Anthony is managed by Dr. 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 Nicholas Lose and continues on [place] 1 mg once daily.  Plans are for her to hold this medication due to her progressive thrombocytopenia.  She will return in one 1 week with labs.  Shortness of breath: She continues to have shortness of breath despite her use of an albuterol inhaler every 4 hours and a Flovent inhaler twice daily.  Despite her shortness of breath she reports that she is feeling better than she has been recently.  Thrombocytopenia: The patient's labs returned showing progression of thrombocytopenia with her platelet count at 33.  This was down from 48 when last checked 4 days ago.  She will stop [place] and will return in 1 week with labs and for a follow-up with Dr. Nicholas Lose.  Please see After Visit Summary for patient specific instructions.  Future Appointments  Date Time Provider [place]  02/27/2020  9:30 AM GI-315 MR 3 GI-315MRI GI-315 [address] [facility]  02/28/2020 10:00 AM [facility] LAB [facility] None  02/28/2020 10:45 AM Nicholas Lose, MD [facility] None  03/02/2020  7:00 AM [facility] BO[facility] CONFERENCE [facility] None  03/02/2020  1:30 PM Hayden Pedro, PA-C [facility] None    No orders of the defined types were placed in this encounter.      Subjective:   Patient ID:  Laura Anthony is a 45  y.o. (DOB July 19, 1975) female.  Chief Complaint: No chief complaint on file.   HPI Laura Anthony is a 45 y.o. female with a diagnosis of an ER negative malignant neoplasm of the left breast.  She continues to be managed by Dr. Nicholas Lose and is currently treated with Talzenna 1 mg PO QDay.  She was last seen in the office 4 days ago.  At that time it was noted that she had thrombocytopenia with a platelet count of 48.  She returns today with a CBC showing a platelet count of 33.  She has had nosebleeds.  She reports that she is passed large blood clots.  She continues to have shortness of breath.  Despite this she states that she is feeling significantly better than she was when seen last.  She is not walking with a rolling walker today.  Medications: I have reviewed the patient's current medications.  Allergies:  Allergies  Allergen Reactions  . Denosumab Other (See Comments)    Osteonecrosis of the jaw  . Statins Other (See Comments)    Leg pain  . Sumatriptan Other (See Comments)    Numbness to face   . Tape Rash    Rash from dressing over port-a-cath     Past Medical History:  Diagnosis Date  . ADD (attention deficit disorder)   . Anemia   . Anxiety   . Breast cancer, left breast ([place])    S/P mastectomy 03/27/2017  . DVT (deep venous thrombosis) ([place]) 2017   calf left - probably due to [facility] pills-took eliquis x3  mos, nonthing now  . High cholesterol   . Impingement syndrome of right shoulder 07/2013  . Migraine    "usually 1/month" (03/28/2017)  . PONV (postoperative nausea and vomiting)   . Right bicipital tenosynovitis 07/2013  . Rotator cuff impingement syndrome of right shoulder 07/12/2013  . Seizures ([place])    x 1 as a child - was never on anticonvulsants (03/28/2017)    Past Surgical History:  Procedure Laterality Date  . ADENOIDECTOMY  1981  . ANKLE ARTHROSCOPY Right   . BREAST BIOPSY Left 10/2016  . KNEE ARTHROSCOPY Right   . KNEE ARTHROSCOPY W/ ACL  RECONSTRUCTION Left   . LIPOSUCTION WITH LIPOFILLING Bilateral 01/05/2018   Procedure: LIPOFILLING FROM ABDOMEN TO BILATERAL CHEST;  Surgeon: Irene Limbo, MD;  Location: [place];  Service: Plastics;  Laterality: Bilateral;  . MASTECTOMY Left 03/27/2017   NIPPLE SPARING MASTECTOMY WITH RADIOACTIVE SEED TARGETED LYMPH NODE EXCISION AND LEFT AXILLARY SENTINEL LYMPH NODE BIOPSY  . MASTECTOMY Right 03/27/2017   RIGHT PROPHYLACTIC NIPPLE SPARING MASTECTOMY  . NIPPLE SPARING MASTECTOMY Right 03/27/2017   Procedure: RIGHT PROPHYLACTIC NIPPLE SPARING MASTECTOMY;  Surgeon: Rolm Bookbinder, MD;  Location: [place];  Service: General;  Laterality: Right;  . PORT-A-CATH REMOVAL Right 01/05/2018   Procedure: REMOVAL RIGHT CHEST PORT;  Surgeon: Irene Limbo, MD;  Location: [place];  Service: Plastics;  Laterality: Right;  . PORTACATH PLACEMENT N/A 11/01/2016   Procedure: INSERTION PORT-A-CATH WITH [place];  Surgeon: Rolm Bookbinder, MD;  Location: [place];  Service: General;  Laterality: N/A;  . PORTACATH PLACEMENT N/A 08/23/2018   Procedure: INSERTION PORT-A-CATH WITH ULTRASOUND;  Surgeon: Rolm Bookbinder, MD;  Location: [place];  Service: General;  Laterality: N/A;  . RADIOACTIVE SEED GUIDED AXILLARY SENTINEL LYMPH NODE Left 03/27/2017   Procedure: LEFT NIPPLE SPARING MASTECTOMY WITH RADIOACTIVE SEED TARGETED LYMPH NODE EXCISION AND LEFT AXILLARY SENTINEL LYMPH NODE BIOPSY;  Surgeon: Rolm Bookbinder, MD;  Location: [place];  Service: General;  Laterality: Left;  REQUESTS RNFA  . RECONSTRUCTION BREAST IMMEDIATE / DELAYED W/ TISSUE EXPANDER Bilateral 03/27/2017   BILATERAL BREAST RECONSTRUCTION WITH PLACEMENT OF TISSUE EXPANDER AND ALLODERM  . REMOVAL OF BILATERAL TISSUE EXPANDERS WITH PLACEMENT OF BILATERAL BREAST IMPLANTS Bilateral 01/05/2018   Procedure: REMOVAL OF BILATERAL TISSUE EXPANDERS WITH PLACEMENT OF  BILATERAL BREAST IMPLANTS;  Surgeon: Irene Limbo, MD;  Location: [place];  Service: Plastics;  Laterality: Bilateral;  . SEPTOPLASTY WITH ETHMOIDECTOMY, AND MAXILLARY ANTROSTOMY  10/29/2010   bilat. max. antrostomy with left max. stripping; left ant. ethmoidectomy; right total ethmoidectomy; sphenoidotomy  . SHOULDER ARTHROSCOPY WITH SUBACROMIAL DECOMPRESSION AND BICEP TENDON REPAIR Right 07/12/2013   Procedure: RIGHT SHOULDER ARTHROSCOPY DEBRIDEMENT EXTENSIVE DECOMPRESSION SUBACROMIAL PARTIAL ACROMIOPLASTY;  Surgeon: Johnny Bridge, MD;  Location: [place];  Service: Orthopedics;  Laterality: Right;  . WRIST ARTHROSCOPY  01/17/2012   Procedure: ARTHROSCOPY WRIST; right wrist Surgeon: Tennis Must, MD;  Location: [place];  Service: Orthopedics;  Laterality: Right;  RIGHT WRIST ARTHROSCOPY WITH TRIANGULAR FIBROCARTILAGE COMPLEX REPAIR AND DEBRIDEMENT     Family History  Problem Relation Age of Onset  . Breast cancer Mother 16       triple negative  . Leukemia Father   . Lung cancer Father   . Heart attack Maternal Uncle   . Prostate cancer Paternal Uncle   . COPD Paternal Grandmother   . Heart disease Paternal Grandfather   . Prostate cancer  Paternal Uncle   . Leukemia Cousin     Social History   Socioeconomic History  . Marital status: Married    Spouse name: Not on file  . Number of children: Not on file  . Years of education: Not on file  . Highest education level: Not on file  Occupational History  . Not on file  Tobacco Use  . Smoking status: Never Smoker  . Smokeless tobacco: Never Used  Vaping Use  . Vaping Use: Never used  Substance and Sexual Activity  . Alcohol use: Yes    Comment: Drinks very rare  . Drug use: No  . Sexual activity: Yes    Birth control/protection: Surgical    Comment: husband has had a vasectomy  Other Topics Concern  . Not on file  Social History Narrative  . Not on file   Social  Determinants of Health   Financial Resource Strain: Not on file  Food Insecurity: Not on file  Transportation Needs: Not on file  Physical Activity: Not on file  Stress: Not on file  Social Connections: Not on file  Intimate Partner Violence: Not on file    Past Medical History, Surgical history, Social history, and Family history were reviewed and updated as appropriate.   Please see review of systems for further details on the patient's review from today.   Review of Systems:  Review of Systems  Constitutional: Negative for chills, diaphoresis and fever.  HENT: Positive for nosebleeds. Negative for trouble swallowing and voice change.   Respiratory: Positive for shortness of breath. Negative for cough, choking, chest tightness, wheezing and stridor.   Cardiovascular: Negative for chest pain and palpitations.  Gastrointestinal: Negative for abdominal pain, constipation, diarrhea, nausea and vomiting.  Musculoskeletal: Negative for back pain and myalgias.  Neurological: Negative for dizziness, light-headedness and headaches.    Objective:   Physical Exam:  BP 105/71   Pulse (!) 103   Temp (!) 97.5 F (36.4 C) (Tympanic)   Resp 20   Ht 5\' 7"  (1.702 m)   Wt 145 lb 12.8 oz (66.1 kg)   SpO2 96%   BMI 22.84 kg/m  ECOG: 1  Physical Exam Constitutional:      General: She is not in acute distress.    Appearance: She is not diaphoretic.  HENT:     Head: Normocephalic and atraumatic.     Right Ear: External ear normal.     Left Ear: External ear normal.     Mouth/Throat:     Pharynx: No oropharyngeal exudate.  Eyes:     General: No scleral icterus.       Right eye: No discharge.        Left eye: No discharge.     Conjunctiva/sclera: Conjunctivae normal.  Cardiovascular:     Rate and Rhythm: Normal rate and regular rhythm.     Heart sounds: Normal heart sounds. No murmur heard. No friction rub. No gallop.   Pulmonary:     Effort: Pulmonary effort is normal. No  respiratory distress.     Breath sounds: Normal breath sounds. No wheezing or rales.  Musculoskeletal:     Cervical back: Normal range of motion and neck supple.  Lymphadenopathy:     Cervical: No cervical adenopathy.  Skin:    General: Skin is warm and dry.     Coloration: Skin is pale.     Findings: No erythema or rash.  Neurological:     Mental Status: She is alert.  Coordination: Coordination normal.     Gait: Gait normal.  Psychiatric:        Behavior: Behavior normal.        Thought Content: Thought content normal.        Judgment: Judgment normal.     Lab Review:     Component Value Date/Time   NA 137 02/18/2020 1326   NA 138 01/27/2017 0802   K 3.8 02/18/2020 1326   K 4.1 01/27/2017 0802   CL 98 02/18/2020 1326   CO2 29 02/18/2020 1326   CO2 26 01/27/2017 0802   GLUCOSE 107 (H) 02/18/2020 1326   GLUCOSE 90 01/27/2017 0802   BUN 21 (H) 02/18/2020 1326   BUN 14.0 01/27/2017 0802   CREATININE 0.83 02/18/2020 1326   CREATININE 0.7 01/27/2017 0802   CALCIUM 9.9 02/18/2020 1326   CALCIUM 8.6 01/27/2017 0802   PROT 7.5 02/18/2020 1326   PROT 6.5 01/27/2017 0802   ALBUMIN 3.2 (L) 02/18/2020 1326   ALBUMIN 3.5 01/27/2017 0802   AST 46 (H) 02/18/2020 1326   AST 22 01/27/2017 0802   ALT 42 02/18/2020 1326   ALT 23 01/27/2017 0802   ALKPHOS 298 (H) 02/18/2020 1326   ALKPHOS 85 01/27/2017 0802   BILITOT 0.7 02/18/2020 1326   BILITOT 0.23 01/27/2017 0802   GFRNONAA >60 02/18/2020 1326   GFRAA >60 10/24/2019 0805       Component Value Date/Time   WBC 5.2 02/18/2020 1326   WBC 2.6 (L) 07/25/2019 0519   RBC 3.08 (L) 02/18/2020 1326   HGB 8.8 (L) 02/18/2020 1326   HGB 11.1 (L) 01/27/2017 0802   HCT 27.1 (L) 02/18/2020 1326   HCT 33.4 (L) 01/27/2017 0802   PLT 33 (L) 02/18/2020 1326   PLT 170 01/27/2017 0802   MCV 88.0 02/18/2020 1326   MCV 99.0 01/27/2017 0802   MCH 28.6 02/18/2020 1326   MCHC 32.5 02/18/2020 1326   RDW 16.8 (H) 02/18/2020 1326   RDW  18.2 (H) 01/27/2017 0802   LYMPHSABS 0.9 02/18/2020 1326   LYMPHSABS 1.7 01/27/2017 0802   MONOABS 0.5 02/18/2020 1326   MONOABS 0.5 01/27/2017 0802   EOSABS 0.1 02/18/2020 1326   EOSABS 0.0 01/27/2017 0802   BASOSABS 0.0 02/18/2020 1326   BASOSABS 0.0 01/27/2017 0802   -------------------------------  Imaging from last 24 hours (if applicable):  Radiology interpretation: DG Chest 2 View  Result Date: 02/14/2020 CLINICAL DATA:  Shortness of breath.  History of breast carcinoma EXAM: CHEST - 2 VIEW COMPARISON:  December 03, 2019 chest radiograph; chest CT December 18, 2019 FINDINGS: Port-A-Cath tip is in the superior vena cava. No pneumothorax. There is a small pleural effusion on the left with scarring left lower lobe region. No edema or consolidation. Multiple nodular metastatic lesions are noted throughout the lung parenchyma, largest on the left measuring approximately 1.8 cm. Heart size and pulmonary vascularity are normal. No appreciable adenopathy. No bone lesions. Postoperative change left breast. IMPRESSION: Multiple metastatic foci throughout the lungs. Scarring left base with small left pleural effusion. No frank edema or consolidation. Heart size normal. No appreciable adenopathy. Port-A-Cath tip in superior vena cava. No evident pneumothorax. Electronically Signed   By: Lowella Grip III M.D.   On: 02/14/2020 14:32

## 2020-02-25 ENCOUNTER — Encounter: Payer: Self-pay | Admitting: Hematology and Oncology

## 2020-02-26 ENCOUNTER — Telehealth: Payer: Self-pay

## 2020-02-26 NOTE — Telephone Encounter (Signed)
Left patient vm message in regards to telephone appointment with Tito Dine PA on 03/02/20 @ 1:30pm. Called to review meaningful use questions. TM

## 2020-02-26 NOTE — Progress Notes (Signed)
  Radiation Oncology         [phone] ________________________________  Name: Petra Dumler MRN: [id number]  Date: 11/26/2019  DOB: [birth year]  End of Treatment Note  Diagnosis:   Brain metastasis     Indication for treatment:  palliative       Radiation treatment dates:   11/26/19  Site/dose:    ExacTrac, 6 vmat beams max dose= 128.5% PTV2 MidCerebellum 40mm 16Gy PTV3 Rt Cerebellum 61mm 20Gy PTV4 Rt Temporal 31mm 20Gy PTV5 Rt Occipital 29mm 20Gy PTV6 Rt Temporal 81mm 20Gy  Narrative: The patient tolerated radiation treatment well.   There were no signs of acute toxicity after treatment.  Plan: The patient has completed radiation treatment. The patient will return to radiation oncology clinic for routine followup in one month. I advised the patient to call or return sooner if they have any questions or concerns related to their recovery or treatment. ________________________________  ------------------------------------------------  Jodelle Gross, MD, PhD

## 2020-02-26 NOTE — Progress Notes (Signed)
  Radiation Oncology         [phone] ________________________________  Name: Laura Anthony MRN: [id number]  Date: 12/11/2019  DOB: [birth year]  End of Treatment Note  Diagnosis:   Bone metastasis     Indication for treatment::  palliative       Radiation treatment dates:   12/05/19 - 12/11/19  Site/dose:   The right clavicle was treated to a dose of 20 Gy in 5 fractions using a 4-field technique.  Narrative: The patient tolerated radiation treatment relatively well.     Plan: The patient has completed radiation treatment. The patient will return to radiation oncology clinic for routine followup in one month. I advised the patient to call or return sooner if they have any questions or concerns related to their recovery or treatment. ________________________________  Jodelle Gross, M.D., Ph.D.

## 2020-02-27 ENCOUNTER — Ambulatory Visit
Admission: RE | Admit: 2020-02-27 | Discharge: 2020-02-27 | Disposition: A | Discharge status: Home / Self Care | Payer: 59 | Source: Ambulatory Visit | Attending: Radiation Oncology | Admitting: Radiation Oncology

## 2020-02-27 ENCOUNTER — Inpatient Hospital Stay
Admission: RE | Admit: 2020-02-27 | Discharge: 2020-02-27 | Disposition: A | Discharge status: Home / Self Care | Payer: 59 | Source: Ambulatory Visit | Attending: Radiation Oncology | Admitting: Radiation Oncology

## 2020-02-27 ENCOUNTER — Telehealth: Payer: Self-pay | Admitting: *Deleted

## 2020-02-27 ENCOUNTER — Other Ambulatory Visit: Payer: Self-pay

## 2020-02-27 DIAGNOSIS — C7931 Secondary malignant neoplasm of brain: Secondary | ICD-10-CM

## 2020-02-27 DIAGNOSIS — C7951 Secondary malignant neoplasm of bone: Secondary | ICD-10-CM

## 2020-02-27 DIAGNOSIS — C50512 Malignant neoplasm of lower-outer quadrant of left female breast: Secondary | ICD-10-CM

## 2020-02-27 DIAGNOSIS — Z171 Estrogen receptor negative status [ER-]: Secondary | ICD-10-CM

## 2020-02-27 MED ORDER — SODIUM CHLORIDE 0.9% FLUSH
10.0000 mL | INTRAVENOUS | Status: DC | PRN
  Administered 2020-02-27: 10 mL via INTRAVENOUS

## 2020-02-27 MED ORDER — HEPARIN SOD (PORK) LOCK FLUSH 100 UNIT/ML IV SOLN
500.0000 [IU] | Freq: Once | INTRAVENOUS | Status: AC
  Administered 2020-02-27: 500 [IU] via INTRAVENOUS

## 2020-02-27 MED ORDER — GADOBENATE DIMEGLUMINE 529 MG/ML IV SOLN
15.0000 mL | Freq: Once | INTRAVENOUS | Status: AC | PRN
  Administered 2020-02-27: 15 mL via INTRAVENOUS

## 2020-02-27 NOTE — Telephone Encounter (Signed)
Spoke to pt concerning brain MRI findings. Pt relate discussed with Bryson Ha PA and plans to move forward with whole brain xrt.   Pt discussed symptoms of increased weakness to bil LE x1 wk as well as incontinence to bowl x1wk. Denies loss of sensation to genital or rectum or numbness to lower extremity.  Relate sob has been getting worse, "inhalers are helping".  Symptoms discussed with Dr. Lindi Adie. Pt scheduled to see Dr. Lindi Adie on 1/28.  Pt discussed decision to pass in hospice house. She did not want to pass at home due to her children. Pt relate greatest fear is "dieing alone". Pt also discussed she and her husband will discuss further treatment and if this is something she wants to pursue. Pt discussed feelings of quality vs quantity of life and wants to do what is best for her to have time with her family.  Validated pt feelings and  Provided emotional support

## 2020-02-27 NOTE — Progress Notes (Signed)
Patient Care Team: [name], MD as PCP - General (Family Medicine) Mauro Kaufmann, RN as Oncology Nurse Navigator Carlynn Spry, Charlott Holler, RN as Oncology Nurse Navigator [place], Aletha Halim, NP as Nurse Practitioner (Hospice and Palliative Medicine) Nicholas Lose, MD as Consulting Physician (Hematology and Oncology) Delice Bison, Charlestine Massed, NP as Nurse Practitioner (Hematology and Oncology) [place], [place] as Registered Nurse [facility] and Palliative Medicine)  DIAGNOSIS:    ICD-10-CM   1. Metastatic breast cancer (HCC)  C50.919 talazoparib tosylate (TALZENNA) 0.25 MG capsule  2. Malignant neoplasm of lower-outer quadrant of left breast of female, estrogen receptor negative ([place])  C50.512    Z17.1     SUMMARY OF ONCOLOGIC HISTORY: 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, estrogen receptor negative ([place])  10/20/2016 Mammogram   Mammogram and ultrasound of the left breast revealed 1.7 cm mass at 4:00 position, 6:30 position 5 x 4 x 4 mm mass, 6:00 position 5 cm nipple 7 x 6 x 11 mm, left axillary lymph node with thickened cortex, T1c N1 stage II a AJCC 8   10/24/2016 Initial Diagnosis   Left breast biopsy 6:30 position 3 cm from nipple: IDC grade 2, DCIS, ER 0%, PR 0%, Ki-67 15% HER-2 positive ratio 2.1; 4:00 position 3 cm from nipple: IDC grade 2, DCIS, ER 0%, PR 0%, Ki-67 35%, HER-2 positive ratio 2.02; left axillary lymph node biopsy positive   11/04/2016 - 02/17/2017 Neo-Adjuvant Chemotherapy   TCH Perjeta 6 cycles followed by Herceptin + Perjeta maintenance to be completed September 2019   11/30/2016 Genetic Testing   Negative genetic testing on the common hereditary cancer panel.  The Hereditary Gene Panel offered by [facility] includes sequencing and/or deletion duplication testing of the following 47 genes: APC, ATM, AXIN2, BARD1, BMPR1A, BRCA1, BRCA2, BRIP1, CDH1, CDK4, CDKN2A (p14ARF), CDKN2A (p16INK4a), CHEK2, CTNNA1, DICER1,  EPCAM (Deletion/duplication testing only), GREM1 (promoter region deletion/duplication testing only), KIT, MEN1, MLH1, MSH2, MSH3, MSH6, MUTYH, NBN, NF1, NHTL1, PALB2, PDGFRA, PMS2, POLD1, POLE, PTEN, RAD50, RAD51C, RAD51D, SDHB, SDHC, SDHD, SMAD4, SMARCA4. STK11, TP53, TSC1, TSC2, and VHL.  The following genes were evaluated for sequence changes only: SDHA and HOXB13 c.251G>A variant only. The report date is November 30, 2016.    03/27/2017 Surgery   Bilateral mastectomies: Left mastectomy: IDC grade 2 0.9 cm, nodes negative, right mastectomy benign, ER 0%, PR 0%, HER-2 positive ratio 2.6   05/08/2017 - 06/09/2017 Radiation Therapy   Adjuvant radiation therapy   10/23/2017 Miscellaneous   Neratinib discontinued after 4 weeks for severe diarrhea   07/25/2018 Relapse/Recurrence   MRI of right elbow showed bone lesion consistent with malignancy. PET scan showed bilateral pulmonary nodules and several lytic bone lesions compatible with metastatic disease. Brain MRI on 08/02/18 showed no evidence of metastatic disease.   08/02/2018 PET scan   Bilateral hypermetabolic lung nodules, LUL 1.3 cm with SUV 3.88, lingular nodule 1.4 cm SUV 3.7, central lingular nodule 1.2 cm SUV 9.76, right middle lobe nodule 1.5 cm SUV 9.9, lytic bone metastases inferior pubic ramus, sacrum, T12, right 11th rib.   08/08/2018 Procedure   Lung biopsy: metastatic carcinoma, HER-2 negative (0), ER/PR negative.   08/10/2018 -  Radiation Therapy   Palliative radiatio to the right humerus along the medial condyle   08/24/2018 - 09/05/2018 Radiation Therapy   Palliative radiation to the right 11th rib and right elbow   09/26/2018 - 12/04/2018 Chemotherapy   Carboplatin atezolizumab at [facility]  with Dr. Janan Halter on [place] clinical trial stopped because of new T5 metastases (toxicities included myopathy required prednisone, immune mediated thyroiditis), right upper extremity DVT on apixaban   12/14/2018 - 04/10/2019 Chemotherapy    [place]   12/17/2018 -  Radiation Therapy   Palliative radiation to sternal, sacral & pelvic lesions and SRS for T3 and C7-T1 lesions.   04/11/2019 -  Chemotherapy   Sacituzumab-Govitecan Laura Anthony)   Bone metastases ([place])  08/07/2018 Initial Diagnosis   Bone metastases ([place])   12/14/2018 - 03/28/2019 Chemotherapy   The patient had pertuzumab (PERJETA) 420 mg in sodium chloride 0.9 % 250 mL chemo infusion, 420 mg (100 % of original dose 420 mg), Intravenous, Once, 5 of 6 cycles Dose modification: 420 mg (original dose 420 mg, Cycle 1, Reason: Provider Judgment) Administration: 420 mg (12/14/2018), 420 mg (01/10/2019), 420 mg (03/28/2019), 420 mg (01/30/2019), 420 mg (03/01/2019) trastuzumab-dkst (OGIVRI) 600 mg in sodium chloride 0.9 % 250 mL chemo infusion, 609 mg, Intravenous,  Once, 5 of 6 cycles Administration: 600 mg (12/14/2018), 450 mg (01/10/2019), 450 mg (03/28/2019), 450 mg (01/30/2019), 450 mg (03/01/2019)  for chemotherapy treatment.    12/17/2018 -  Radiation Therapy   Palliative radiation to sternal, sacral & pelvic lesions and SRS for T3 and C7-T1 lesions.     CHIEF COMPLIANT: Follow-up of metastatic breast cancer  INTERVAL HISTORY: Laura Anthony is a 45 y.o. with above-mentioned history of metastatic breast cancer who is currently on treatment with talazoparib.  It was held 2 weeks ago because of severe thrombocytopenia.  She was recently diagnosed with brain metastases through a brain MRI.  She is scheduled to undergo spine MRI today.  She is contemplating on what to do with her overall treatment plan.  She is here today accompanied by her husband..  She has experienced few episodes of urinary incontinence.  Her pain is under excellent control.  She overall feels better since she stopped the talazoparib.  She has not more energy.  Previously she was sleeping all day.  She was also having profound nosebleeds when she was thrombocytopenic.  Recently the nosebleeds have  stopped.  ALLERGIES:  is allergic to denosumab, statins, sumatriptan, and tape.  MEDICATIONS:  Current Outpatient Medications  Medication Sig Dispense Refill  . albuterol (VENTOLIN HFA) 108 (90 Base) MCG/ACT inhaler Inhale 1-2 puffs into the lungs every 6 (six) hours as needed for wheezing or shortness of breath. 18 g 6  . celecoxib (CELEBREX) 100 MG capsule Take 1 capsule (100 mg total) by mouth 2 (two) times daily. 60 capsule 3  . fluticasone (FLOVENT HFA) 110 MCG/ACT inhaler Inhale 2 puffs into the lungs in the morning and at bedtime. 1 each 12  . gabapentin (NEURONTIN) 300 MG capsule 2 capsules twice daily 120 capsule 5  . levothyroxine (SYNTHROID) 75 MCG tablet Take 1 tablet (75 mcg total) by mouth daily before breakfast. 30 tablet 6  . lidocaine-prilocaine (EMLA) cream Apply topically daily.    Marland Kitchen liothyronine (CYTOMEL) 25 MCG tablet Take 1 tablet (25 mcg total) by mouth daily. 30 tablet 3  . lisdexamfetamine (VYVANSE) 40 MG capsule Take 1 capsule (40 mg total) by mouth every morning. 30 capsule 0  . LORazepam (ATIVAN) 0.5 MG tablet Take 1 tablet (0.5 mg total) by mouth every 6 (six) hours as needed for anxiety. 45 tablet 1  . methadone (DOLOPHINE) 10 MG tablet Take 1 tablet (10 mg total) by mouth every 8 (eight) hours. 90 tablet 0  .  naloxone (NARCAN) 4 MG/0.1ML LIQD nasal spray kit Spray once in one nostril prn overdose, may repeat x 1 (Patient not taking: Reported on 11/14/2019) 1 each 1  . oxyCODONE-acetaminophen (PERCOCET) 10-325 MG tablet Take 1 tablet by mouth every 4 (four) hours as needed for pain. 120 tablet 0  . pantoprazole (PROTONIX) 40 MG tablet TAKE 1 TABLET BY MOUTH TWICE A DAY 180 tablet 1  . prochlorperazine (COMPAZINE) 10 MG tablet Take 1 tablet (10 mg total) by mouth every 6 (six) hours as needed (Nausea or vomiting). 30 tablet 1  . talazoparib tosylate (TALZENNA) 0.25 MG capsule Take 3 capsules (0.75 mg total) by mouth daily. 90 capsule 3  . venlafaxine XR (EFFEXOR-XR)  150 MG 24 hr capsule TAKE 1 CAPSULE (150 MG TOTAL) BY MOUTH DAILY WITH BREAKFAST. 90 capsule 3  . XARELTO 20 MG TABS tablet TAKE 1 TABLET BY MOUTH DAILY WITH SUPPER. 30 tablet 1  . zolpidem (AMBIEN CR) 12.5 MG CR tablet Take 1 tablet (12.5 mg total) by mouth at bedtime. 30 tablet 3   No current facility-administered medications for this visit.    PHYSICAL EXAMINATION: ECOG PERFORMANCE STATUS: 2 - Symptomatic, <50% confined to bed  Vitals:   02/28/20 1055  BP: 106/69  Pulse: (!) 114  Resp: 18  Temp: 98.1 F (36.7 C)  SpO2: 97%   Filed Weights   02/28/20 1055  Weight: 144 lb 14.4 oz (65.7 kg)    LABORATORY DATA:  I have reviewed the data as listed CMP Latest Ref Rng & Units 02/28/2020 02/18/2020 02/14/2020  Glucose 70 - 99 mg/dL 127(H) 107(H) 101(H)  BUN 6 - 20 mg/dL 15 21(H) 20  Creatinine 0.44 - 1.00 mg/dL 0.83 0.83 0.82  Sodium 135 - 145 mmol/L 139 137 140  Potassium 3.5 - 5.1 mmol/L 3.9 3.8 4.4  Chloride 98 - 111 mmol/L 99 98 101  CO2 22 - 32 mmol/L 30 29 28   Calcium 8.9 - 10.3 mg/dL 10.3 9.9 10.4(H)  Total Protein 6.5 - 8.1 g/dL 7.6 7.5 8.0  Total Bilirubin 0.3 - 1.2 mg/dL 0.4 0.7 0.6  Alkaline Phos 38 - 126 U/L 278(H) 298(H) 420(H)  AST 15 - 41 U/L 30 46(H) 79(H)  ALT 0 - 44 U/L 21 42 68(H)    Lab Results  Component Value Date   WBC 3.9 (L) 02/28/2020   HGB 7.7 (L) 02/28/2020   HCT 23.9 (L) 02/28/2020   MCV 88.8 02/28/2020   PLT 66 (L) 02/28/2020   NEUTROABS 2.4 02/28/2020    ASSESSMENT & PLAN:  Malignant neoplasm of lower-outer quadrant of left breast of female, estrogen receptor negative ([place]) 10/24/2016: Left breast biopsy 6:30 position 3 cm from nipple: IDC grade 2, DCIS, ER 0%, PR 0%, Ki-67 15% HER-2 positive ratio 2.1; 4:00 position 3 cm from nipple: IDC grade 2, DCIS, ER 0%, PR 0%, Ki-67 35%, HER-2 positive ratio 2.02 Lymph node biopsy positive  Treatment Summary: 1. Neoadjuvant chemotherapy with TCHPcompleted 02/17/2017 this would be followed by  Herceptinand Perjetamaintenance for 1 yearcompleted September 2019 2.Bilateral mastectomies 03/28/2016:Bilateral mastectomies: Left mastectomy: IDC grade 2 0.9 cm, nodes negative, right mastectomy benign, ER 0%, PR 0%, HER-2 positive ratio 2.6 3.Adjuvant radiation4/08/2017 to 06/09/2017 4.Neratinib started 10/12/2017 discontinued due to diarrhea 5. Elbow fracture: Due to metastatic disease, palliative radiation therapy 6. Carboplatin atezolizumab at [facility] on a clinical trial [facility] 043 stopped for progression 12/04/2018 7.Laura Anthony discontinued because of progression 8.Xeloda started 10/1/2021discontinued 12/19/2019 for progression 9.  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  Improvement of pre-existing pontine, right cerebellar and right temporal lesions.  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 Spine MRI being done today. CT CAP been ordered to evaluate systemic disease.  Treatment plan: If she does not have leptomeningeal carcinomatosis or worsening systemic disease then she will undergo whole brain radiation followed by reduced dosage of talazoparib of 0.75 mg. She tells me that if there was worsening disease on the systemic level as well as leptomeningeal carcinomatosis she does not intend to pursue any treatment and would like to go on hospice.  No orders of the defined types were placed in this encounter.  The patient has a good understanding of the overall plan. she agrees with it. she will call with any problems that may develop before the next visit  here.  Total time spent: 30 mins including face to face time and time spent for planning, charting and coordination of care  Nicholas Lose, MD 02/28/2020  I, Cloyde Reams Dorshimer, am acting as scribe for Dr. Nicholas Lose.  I have reviewed the above documentation for accuracy and completeness, and I agree with the above.

## 2020-02-28 ENCOUNTER — Inpatient Hospital Stay (HOSPITAL_BASED_OUTPATIENT_CLINIC_OR_DEPARTMENT_OTHER): Payer: 59 | Admitting: Hematology and Oncology

## 2020-02-28 ENCOUNTER — Other Ambulatory Visit: Payer: Self-pay | Admitting: *Deleted

## 2020-02-28 ENCOUNTER — Ambulatory Visit (HOSPITAL_COMMUNITY)
Admission: RE | Admit: 2020-02-28 | Discharge: 2020-02-28 | Disposition: A | Discharge status: Home / Self Care | Payer: 59 | Source: Ambulatory Visit | Attending: Hematology and Oncology | Admitting: Hematology and Oncology

## 2020-02-28 ENCOUNTER — Other Ambulatory Visit: Payer: Self-pay

## 2020-02-28 ENCOUNTER — Inpatient Hospital Stay: Payer: 59

## 2020-02-28 ENCOUNTER — Telehealth: Payer: Self-pay | Admitting: *Deleted

## 2020-02-28 VITALS — BP 106/69 | HR 114 | Temp 98.1°F | Resp 18 | Ht 67.0 in | Wt 144.9 lb

## 2020-02-28 DIAGNOSIS — Z171 Estrogen receptor negative status [ER-]: Secondary | ICD-10-CM | POA: Diagnosis not present

## 2020-02-28 DIAGNOSIS — C50919 Malignant neoplasm of unspecified site of unspecified female breast: Secondary | ICD-10-CM

## 2020-02-28 DIAGNOSIS — R0602 Shortness of breath: Secondary | ICD-10-CM

## 2020-02-28 DIAGNOSIS — D696 Thrombocytopenia, unspecified: Secondary | ICD-10-CM

## 2020-02-28 DIAGNOSIS — C50512 Malignant neoplasm of lower-outer quadrant of left female breast: Secondary | ICD-10-CM

## 2020-02-28 LAB — CMP (CANCER CENTER ONLY)
ALT: 21 U/L (ref 0–44)
AST: 30 U/L (ref 15–41)
Albumin: 3 g/dL — ABNORMAL LOW (ref 3.5–5.0)
Alkaline Phosphatase: 278 U/L — ABNORMAL HIGH (ref 38–126)
Anion gap: 10 (ref 5–15)
BUN: 15 mg/dL (ref 6–20)
CO2: 30 mmol/L (ref 22–32)
Calcium: 10.3 mg/dL (ref 8.9–10.3)
Chloride: 99 mmol/L (ref 98–111)
Creatinine: 0.83 mg/dL (ref 0.44–1.00)
GFR, Estimated: >60 mL/min (ref >60)
Glucose, Bld: 127 mg/dL — ABNORMAL HIGH (ref 70–99)
Potassium: 3.9 mmol/L (ref 3.5–5.1)
Sodium: 139 mmol/L (ref 135–145)
Total Bilirubin: 0.4 mg/dL (ref 0.3–1.2)
Total Protein: 7.6 g/dL (ref 6.5–8.1)

## 2020-02-28 LAB — CBC WITH DIFFERENTIAL (CANCER CENTER ONLY)
Abs Immature Granulocytes: 0.01 10*3/uL (ref 0.00–0.07)
Basophils Absolute: 0 10*3/uL (ref 0.0–0.1)
Basophils Relative: 1 %
Eosinophils Absolute: 0 10*3/uL (ref 0.0–0.5)
Eosinophils Relative: 1 %
HCT: 23.9 % — ABNORMAL LOW (ref 36.0–46.0)
Hemoglobin: 7.7 g/dL — ABNORMAL LOW (ref 12.0–15.0)
Immature Granulocytes: 0 %
Lymphocytes Relative: 21 %
Lymphs Abs: 0.8 10*3/uL (ref 0.7–4.0)
MCH: 28.6 pg (ref 26.0–34.0)
MCHC: 32.2 g/dL (ref 30.0–36.0)
MCV: 88.8 fL (ref 80.0–100.0)
Monocytes Absolute: 0.6 10*3/uL (ref 0.1–1.0)
Monocytes Relative: 15 %
Neutro Abs: 2.4 10*3/uL (ref 1.7–7.7)
Neutrophils Relative %: 62 %
Platelet Count: 66 10*3/uL — ABNORMAL LOW (ref 150–400)
RBC: 2.69 MIL/uL — ABNORMAL LOW (ref 3.87–5.11)
RDW: 18.2 % — ABNORMAL HIGH (ref 11.5–15.5)
WBC Count: 3.9 10*3/uL — ABNORMAL LOW (ref 4.0–10.5)
nRBC: 0 % (ref 0.0–0.2)

## 2020-02-28 MED ORDER — TALAZOPARIB TOSYLATE 0.25 MG PO CAPS: 0.7500 mg | ORAL_CAPSULE | Freq: Every day | ORAL | 3 refills | Status: DC

## 2020-02-28 MED ORDER — GADOBUTROL 1 MMOL/ML IV SOLN
6.0000 mL | Freq: Once | INTRAVENOUS | Status: AC | PRN
  Administered 2020-02-28: 6 mL via INTRAVENOUS

## 2020-02-28 NOTE — Assessment & Plan Note (Signed)
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 Neoadjuvant chemotherapy with TCHPcompleted 02/17/2017 this would be followed by Herceptinand Perjetamaintenance for 1 yearcompleted September 2019 2.Bilateral mastectomies 03/28/2016:Bilateral mastectomies: Left mastectomy: IDC grade 2 0.9 cm, nodes negative, right mastectomy benign, ER 0%, PR 0%, HER-2 positive ratio 2.6 3.Adjuvant radiation4/08/2017 to 06/09/2017 4.Neratinib started 10/12/2017 discontinued due to diarrhea 5. Elbow fracture: Due to metastatic disease, palliative radiation therapy 6. Carboplatin atezolizumab at [facility] on a clinical trial [facility] 043 stopped for progression 12/04/2018 7.[name] discontinued because of progression 8.Xeloda started 10/1/2021discontinued 12/19/2019 for progression 9.  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  Improvement of pre-existing pontine, right cerebellar and right temporal lesions.  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

## 2020-02-28 NOTE — Telephone Encounter (Signed)
Per Dr. Lindi Adie MRI spine ordered. Scheduled and confirmed with pt for 02/28/19 at 2pm.

## 2020-02-29 DIAGNOSIS — C7931 Secondary malignant neoplasm of brain: Secondary | ICD-10-CM | POA: Insufficient documentation

## 2020-02-29 NOTE — Progress Notes (Signed)
Radiation Oncology         [phone] ________________________________  Outpatient Follow Up - Conducted via telephone due to current COVID-19 concerns for limiting patient exposure  I spoke with the patient to conduct this consult visit via telephone to spare the patient unnecessary potential exposure in the healthcare setting during the current COVID-19 pandemic. The patient was notified in advance and was offered a [place] meeting to allow for face to face communication but unfortunately reported that they did not have the appropriate resources/technology to support such a visit and instead preferred to proceed with a telephone visit.  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  12/13/2018-01/07/2019:  The sternum, pelvis and sacrum, T and C spine were treated to 37.5 Gy in 15 fractions   12/19/18 SRS Treatment: The T1 vertebral body was treated with stereotactic radiosurgery (SRS)[facility]18 Gy in 1 fraction  08/22/2018-09/05/2018:  30 Gy in 10 fractions to the right 11th rib  08/08/2018-08/21/2018:  30 Gy in 10 fractions to the right elbow  05/08/2017 - 06/21/2017: The patient initially received a dose of 50.4 Gy in 28 fractions to the leftchest wall,supraclavicular region, and posterior axillary  region. This was delivered using a 3-D conformal, 4 field technique. The patient then received a boost to the mastectomy scar. This delivered an additional 10 Gy in 5 fractions using an en face electron field. The total dose was 60.4 Gy.  Narrative:  Laura Anthony pleasant 44 y.57 female with a history of recurrent metastatic breast cancer involving the lungs, bone, and brain.  She is well-known to our service and has had multiple courses of radiotherapy. She is followed for her prior brain treatment with MRI surveillance. This was performed today and revealed at least 19 metastatic lesions in the brain parenchyma, as well as an area of enhancement in her left internal auditory canal. She's contacted by phone to review these results. Her scans will be discussed on Monday in brain oncology conference, but she is called earlier so we can review treatment options, and our ability to offer treatment. She has been on treatment hold systemically due to thrombocytopenia in the 30K range. Her regimen is oral [place].   On review of systems, the patient reports that she is very tired and sleeps most days almonst 18 hours. She sleeps from 8pm to 2pm the following day, is awake for several hours then back to sleep in the evening. She reports she has had some mild headaches off and on for several days and also has noticed ringing in her ears gradually in the last few months. She denies lateralization of tinnitus, or hearing loss. Her teenage son has tested positive, and while she has not been around him in several days, she is quarantining at home. She denies any fevers,  respiratory complaints, or congestion. She tested negative for covid today. She denies any other concerns.   PAST MEDICAL HISTORY:  Past Medical History:  Diagnosis Date  . ADD (attention deficit disorder)   . Anemia   . Anxiety   . Breast cancer, left breast ([place])    S/P mastectomy 03/27/2017  . DVT (deep venous thrombosis) ([place])  2017   calf left - probably due to [facility] pills-took eliquis x3 mos, nonthing now  . High cholesterol   . Impingement syndrome of right shoulder 07/2013  . Migraine    "usually 1/month" (03/28/2017)  . PONV (postoperative nausea and vomiting)   . Right bicipital tenosynovitis 07/2013  . Rotator cuff impingement syndrome of right shoulder 07/12/2013  . Seizures ([place])    x 1 as a child - was never on anticonvulsants (03/28/2017)    PAST SURGICAL HISTORY: Past Surgical History:  Procedure Laterality Date  . ADENOIDECTOMY  1981  . ANKLE ARTHROSCOPY Right   . BREAST BIOPSY Left 10/2016  . KNEE ARTHROSCOPY Right   . KNEE ARTHROSCOPY W/ ACL RECONSTRUCTION Left   . LIPOSUCTION WITH LIPOFILLING Bilateral 01/05/2018   Procedure: LIPOFILLING FROM ABDOMEN TO BILATERAL CHEST;  Surgeon: Irene Limbo, MD;  Location: [place];  Service: Plastics;  Laterality: Bilateral;  . MASTECTOMY Left 03/27/2017   NIPPLE SPARING MASTECTOMY WITH RADIOACTIVE SEED TARGETED LYMPH NODE EXCISION AND LEFT AXILLARY SENTINEL LYMPH NODE BIOPSY  . MASTECTOMY Right 03/27/2017   RIGHT PROPHYLACTIC NIPPLE SPARING MASTECTOMY  . NIPPLE SPARING MASTECTOMY Right 03/27/2017   Procedure: RIGHT PROPHYLACTIC NIPPLE SPARING MASTECTOMY;  Surgeon: Rolm Bookbinder, MD;  Location: [place];  Service: General;  Laterality: Right;  . PORT-A-CATH REMOVAL Right 01/05/2018   Procedure: REMOVAL RIGHT CHEST PORT;  Surgeon: Irene Limbo, MD;  Location: [place];  Service: Plastics;  Laterality: Right;  . PORTACATH PLACEMENT N/A 11/01/2016   Procedure: INSERTION PORT-A-CATH WITH [place];  Surgeon: Rolm Bookbinder, MD;  Location: [place];  Service: General;  Laterality: N/A;  . PORTACATH PLACEMENT N/A 08/23/2018   Procedure: INSERTION PORT-A-CATH WITH ULTRASOUND;  Surgeon: Rolm Bookbinder, MD;  Location: [place];  Service: General;  Laterality: N/A;  . RADIOACTIVE SEED GUIDED  AXILLARY SENTINEL LYMPH NODE Left 03/27/2017   Procedure: LEFT NIPPLE SPARING MASTECTOMY WITH RADIOACTIVE SEED TARGETED LYMPH NODE EXCISION AND LEFT AXILLARY SENTINEL LYMPH NODE BIOPSY;  Surgeon: Rolm Bookbinder, MD;  Location: [place];  Service: General;  Laterality: Left;  REQUESTS RNFA  . RECONSTRUCTION BREAST IMMEDIATE / DELAYED W/ TISSUE EXPANDER Bilateral 03/27/2017   BILATERAL BREAST RECONSTRUCTION WITH PLACEMENT OF TISSUE EXPANDER AND ALLODERM  . REMOVAL OF BILATERAL TISSUE EXPANDERS WITH PLACEMENT OF BILATERAL BREAST IMPLANTS Bilateral 01/05/2018   Procedure: REMOVAL OF BILATERAL TISSUE EXPANDERS WITH PLACEMENT OF BILATERAL BREAST IMPLANTS;  Surgeon: Irene Limbo, MD;  Location: [place];  Service: Plastics;  Laterality: Bilateral;  . SEPTOPLASTY WITH ETHMOIDECTOMY, AND MAXILLARY ANTROSTOMY  10/29/2010   bilat. max. antrostomy with left max. stripping; left ant. ethmoidectomy; right total ethmoidectomy; sphenoidotomy  . SHOULDER ARTHROSCOPY WITH SUBACROMIAL DECOMPRESSION AND BICEP TENDON REPAIR Right 07/12/2013   Procedure: RIGHT SHOULDER ARTHROSCOPY DEBRIDEMENT EXTENSIVE DECOMPRESSION SUBACROMIAL PARTIAL ACROMIOPLASTY;  Surgeon: Johnny Bridge, MD;  Location: [place];  Service: Orthopedics;  Laterality: Right;  . WRIST ARTHROSCOPY  01/17/2012   Procedure: ARTHROSCOPY WRIST; right wrist Surgeon: Tennis Must, MD;  Location: [place];  Service: Orthopedics;  Laterality: Right;  RIGHT WRIST ARTHROSCOPY WITH TRIANGULAR FIBROCARTILAGE COMPLEX REPAIR AND DEBRIDEMENT     PAST SOCIAL HISTORY:  Social History   Socioeconomic History  . Marital status: Married    Spouse name: Not on file  . Number of children: Not on file  . Years of education: Not on file  . Highest education level: Not on file  Occupational History  . Not on file  Tobacco Use  . Smoking status: Never Smoker  . Smokeless tobacco: Never Used   Vaping Use  . Vaping Use: Never used  Substance and Sexual Activity  . Alcohol use: Yes    Comment: Drinks very rare  . Drug use: No  . Sexual activity: Yes    Birth control/protection: Surgical    Comment: husband has had a vasectomy  Other Topics Concern  . Not on file  Social History Narrative  . Not on file   Social Determinants of Health   Financial Resource Strain: Not on file  Food Insecurity: Not on file  Transportation Needs: Not on file  Physical Activity: Not on file  Stress: Not on file  Social Connections: Not on file  Intimate Partner Violence: Not on file    PAST FAMILY HISTORY: Family History  Problem Relation Age of Onset  . Breast cancer Mother 51       triple negative  . Leukemia Father   . Lung cancer Father   . Heart attack Maternal Uncle   . Prostate cancer Paternal Uncle   . COPD Paternal Grandmother   . Heart disease Paternal Grandfather   . Prostate cancer Paternal Uncle   . Leukemia Cousin     MEDICATIONS  Current Outpatient Medications  Medication Sig Dispense Refill  . albuterol (VENTOLIN HFA) 108 (90 Base) MCG/ACT inhaler Inhale 1-2 puffs into the lungs every 6 (six) hours as needed for wheezing or shortness of breath. 18 g 6  . celecoxib (CELEBREX) 100 MG capsule Take 1 capsule (100 mg total) by mouth 2 (two) times daily. 60 capsule 3  . fluticasone (FLOVENT HFA) 110 MCG/ACT inhaler Inhale 2 puffs into the lungs in the morning and at bedtime. 1 each 12  . gabapentin (NEURONTIN) 300 MG capsule 2 capsules twice daily 120 capsule 5  . levothyroxine (SYNTHROID) 75 MCG tablet Take 1 tablet (75 mcg total) by mouth daily before breakfast. 30 tablet 6  . lidocaine-prilocaine (EMLA) cream Apply topically daily.    Marland Kitchen liothyronine (CYTOMEL) 25 MCG tablet Take 1 tablet (25 mcg total) by mouth daily. 30 tablet 3  . lisdexamfetamine (VYVANSE) 40 MG capsule Take 1 capsule (40 mg total) by mouth every morning. 30 capsule 0  . LORazepam (ATIVAN) 0.5  MG tablet Take 1 tablet (0.5 mg total) by mouth every 6 (six) hours as needed for anxiety. 45 tablet 1  . methadone (DOLOPHINE) 10 MG tablet Take 1 tablet (10 mg total) by mouth every 8 (eight) hours. 90 tablet 0  . naloxone (NARCAN) 4 MG/0.1ML LIQD nasal spray kit Spray once in one nostril prn overdose, may repeat x 1 (Patient not taking: Reported on 11/14/2019) 1 each 1  . oxyCODONE-acetaminophen (PERCOCET) 10-325 MG tablet Take 1 tablet by mouth every 4 (four) hours as needed for pain. 120 tablet 0  . pantoprazole (PROTONIX) 40 MG tablet TAKE 1 TABLET BY MOUTH TWICE A DAY 180 tablet 1  . prochlorperazine (COMPAZINE) 10 MG tablet Take 1 tablet (10 mg total) by mouth every 6 (six) hours as needed (Nausea or vomiting). [place]  tablet 1  . talazoparib tosylate (TALZENNA) 0.25 MG capsule Take 3 capsules (0.75 mg total) by mouth daily. 90 capsule 3  . venlafaxine XR (EFFEXOR-XR) 150 MG 24 hr capsule TAKE 1 CAPSULE (150 MG TOTAL) BY MOUTH DAILY WITH BREAKFAST. 90 capsule 3  . XARELTO 20 MG TABS tablet TAKE 1 TABLET BY MOUTH DAILY WITH SUPPER. 30 tablet 1  . zolpidem (AMBIEN CR) 12.5 MG CR tablet Take 1 tablet (12.5 mg total) by mouth at bedtime. 30 tablet 3   No current facility-administered medications for this encounter.    ALLERGIES:  Allergies  Allergen Reactions  . Denosumab Other (See Comments)    Osteonecrosis of the jaw  . Statins Other (See Comments)    Leg pain  . Sumatriptan Other (See Comments)    Numbness to face   . Tape Rash    Rash from dressing over port-a-cath    Physical Exam: Unable to assess due to encounter type.   Impression/Plan: 1. 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 The patient is symptomatic and Dr. [name] has reviewed her scans and would recommend whole brain radiation. The  patient would like to consider her options as well. She will follow up tomorrow with Dr. Lindi Adie to discuss next steps of systemic therapy and possibly resuming this soon. We reviewed the risks, benefits, short and long term effects of whole brain radiation over 2 weeks. I have held a spot on the schedule for her on Monday for simulation if she wishes to proceed. 2. Goals of care. The patient is under the care of [facility] palliative [facility] and wishes to maintain her quality of life in any treatment she considers in the future. We will make sure her goals are met as she contemplates radiation. 3. Possible covid exposure. The patient tested negative, but will retest again Monday morning prior to coming to the cancer center for simulation.    Given current concerns for patient exposure during the COVID-19 pandemic, this encounter was conducted via telephone.  The patient has provided two factor identification and has given verbal consent for this type of encounter and has been advised to only accept a meeting of this type in a secure network environment. The time spent during this encounter was 45 minutes including preparation, discussion, and coordination of the patient's care. The attendants for this meeting include  Hayden Pedro  and Hilda Lias Oshea.  During the encounter,  Hayden Pedro was located at [facility] Radiation Oncology Department.  Laura Anthony was located at home.     Carola Rhine, PAC   Addendum: I spoke with Dr. Lindi Adie and there was some question about the need for additional tissue. I did speak with Dr. 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 Dr. Lindi Adie  will consider this if her platelets improve if she were to consider additional systemic therapy to see if there is any actionable mutation that systemic therapy could be more directed. I spoke with the patient again to communicate this. She is leaning toward whole brain radiation. During our conversation she  mentioned that she hadn't said anything earlier but that for the last week she has had at least one instance per day of fecal incontinence. She thought this was initially due to taking miralax and her stool being very loose. She denies any lower extremity weakness, numbness, tingling, or burning symptoms. She is continent of urine, and denies saddle paresthesias or other lower extremity parathesias. I let her know I would share this with Dr. Lindi Adie and Dr. [name] and that she likely needs an MRI to rule out spinal cord concerns. She is encouraged to go to the ED if she develops any additional symptoms mentioned above that she denied. She is in agreement with this plan.     Carola Rhine, PAC

## 2020-03-02 ENCOUNTER — Ambulatory Visit
Admission: RE | Admit: 2020-03-02 | Discharge: 2020-03-02 | Disposition: A | Discharge status: Home / Self Care | Payer: 59 | Source: Ambulatory Visit | Attending: Radiation Oncology | Admitting: Radiation Oncology

## 2020-03-02 ENCOUNTER — Other Ambulatory Visit: Payer: Self-pay | Admitting: *Deleted

## 2020-03-02 ENCOUNTER — Other Ambulatory Visit: Payer: Self-pay

## 2020-03-02 ENCOUNTER — Inpatient Hospital Stay: Admission: RE | Admit: 2020-03-02 | Payer: Self-pay | Source: Ambulatory Visit | Admitting: Radiation Oncology

## 2020-03-02 ENCOUNTER — Inpatient Hospital Stay: Payer: 59

## 2020-03-02 DIAGNOSIS — C7931 Secondary malignant neoplasm of brain: Secondary | ICD-10-CM | POA: Insufficient documentation

## 2020-03-02 DIAGNOSIS — C50919 Malignant neoplasm of unspecified site of unspecified female breast: Secondary | ICD-10-CM

## 2020-03-02 DIAGNOSIS — Z510 Encounter for antineoplastic radiation therapy: Secondary | ICD-10-CM | POA: Diagnosis not present

## 2020-03-03 DIAGNOSIS — C7931 Secondary malignant neoplasm of brain: Secondary | ICD-10-CM | POA: Diagnosis present

## 2020-03-03 DIAGNOSIS — C50512 Malignant neoplasm of lower-outer quadrant of left female breast: Secondary | ICD-10-CM | POA: Insufficient documentation

## 2020-03-03 DIAGNOSIS — Z171 Estrogen receptor negative status [ER-]: Secondary | ICD-10-CM | POA: Insufficient documentation

## 2020-03-04 ENCOUNTER — Other Ambulatory Visit: Payer: Self-pay | Admitting: *Deleted

## 2020-03-04 ENCOUNTER — Encounter: Payer: Self-pay | Admitting: *Deleted

## 2020-03-04 ENCOUNTER — Ambulatory Visit
Admission: RE | Admit: 2020-03-04 | Discharge: 2020-03-04 | Disposition: A | Discharge status: Home / Self Care | Payer: 59 | Source: Ambulatory Visit | Attending: Radiation Oncology | Admitting: Radiation Oncology

## 2020-03-04 DIAGNOSIS — C7931 Secondary malignant neoplasm of brain: Secondary | ICD-10-CM | POA: Diagnosis not present

## 2020-03-04 DIAGNOSIS — C50919 Malignant neoplasm of unspecified site of unspecified female breast: Secondary | ICD-10-CM

## 2020-03-04 NOTE — Progress Notes (Signed)
    Durable Medical Equipment  (From admission, onward)         Start     Ordered   03/04/20 0000  For home use only DME lightweight manual wheelchair with seat cushion       Comments: Patient suffers from metastatic breast cancer which impairs their ability to perform daily activities like walking extended distances in the home.  A walker or cane will not resolve  issue with performing activities of daily living. A wheelchair will allow patient to safely perform daily activities. Patient is not able to propel themselves in the home using a standard weight wheelchair due to weakness. Patient can self propel in the lightweight wheelchair. Length of need indefinitely.  Accessories: elevating leg rests (ELRs), wheel locks, extensions and anti-tippers.   03/04/20 1015

## 2020-03-05 ENCOUNTER — Ambulatory Visit
Admission: RE | Admit: 2020-03-05 | Discharge: 2020-03-05 | Disposition: A | Discharge status: Home / Self Care | Payer: 59 | Source: Ambulatory Visit | Attending: Radiation Oncology | Admitting: Radiation Oncology

## 2020-03-05 ENCOUNTER — Other Ambulatory Visit: Payer: Self-pay | Admitting: Adult Health

## 2020-03-05 ENCOUNTER — Other Ambulatory Visit: Payer: Self-pay

## 2020-03-05 DIAGNOSIS — C50512 Malignant neoplasm of lower-outer quadrant of left female breast: Secondary | ICD-10-CM

## 2020-03-05 DIAGNOSIS — C7931 Secondary malignant neoplasm of brain: Secondary | ICD-10-CM | POA: Diagnosis not present

## 2020-03-05 DIAGNOSIS — Z171 Estrogen receptor negative status [ER-]: Secondary | ICD-10-CM

## 2020-03-05 MED ORDER — METHADONE HCL 10 MG PO TABS: 10.0000 mg | ORAL_TABLET | Freq: Three times a day (TID) | ORAL | 0 refills | Status: DC

## 2020-03-05 NOTE — Progress Notes (Signed)
Refilling per chronic pain management per Dr. Lindi Adie for her metastatic cancer pain.  PMP aware reviewed.    Wilber Bihari, NP

## 2020-03-06 ENCOUNTER — Ambulatory Visit
Admission: RE | Admit: 2020-03-06 | Discharge: 2020-03-06 | Disposition: A | Discharge status: Home / Self Care | Payer: 59 | Source: Ambulatory Visit | Attending: Radiation Oncology | Admitting: Radiation Oncology

## 2020-03-06 ENCOUNTER — Other Ambulatory Visit: Payer: Self-pay

## 2020-03-06 DIAGNOSIS — C7931 Secondary malignant neoplasm of brain: Secondary | ICD-10-CM | POA: Diagnosis not present

## 2020-03-09 ENCOUNTER — Other Ambulatory Visit: Payer: Self-pay

## 2020-03-09 ENCOUNTER — Telehealth: Payer: Self-pay | Admitting: *Deleted

## 2020-03-09 ENCOUNTER — Ambulatory Visit
Admission: RE | Admit: 2020-03-09 | Discharge: 2020-03-09 | Disposition: A | Discharge status: Home / Self Care | Payer: 59 | Source: Ambulatory Visit | Attending: Radiation Oncology | Admitting: Radiation Oncology

## 2020-03-09 ENCOUNTER — Other Ambulatory Visit: Payer: Self-pay | Admitting: *Deleted

## 2020-03-09 DIAGNOSIS — C7931 Secondary malignant neoplasm of brain: Secondary | ICD-10-CM | POA: Diagnosis not present

## 2020-03-09 MED ORDER — LORAZEPAM 0.5 MG PO TABS: 0.5000 mg | ORAL_TABLET | Freq: Four times a day (QID) | ORAL | 1 refills | Status: AC | PRN

## 2020-03-09 NOTE — Telephone Encounter (Signed)
Pt called requesting to cancel her CT scan. She has decided she does not want to pursue chemo or oral treatment. She will complete whole brain xrt. Physician team notified.

## 2020-03-10 ENCOUNTER — Other Ambulatory Visit: Payer: Self-pay

## 2020-03-10 ENCOUNTER — Other Ambulatory Visit: Payer: Self-pay | Admitting: Internal Medicine

## 2020-03-10 ENCOUNTER — Ambulatory Visit
Admission: RE | Admit: 2020-03-10 | Discharge: 2020-03-10 | Disposition: A | Discharge status: Home / Self Care | Payer: 59 | Source: Ambulatory Visit | Attending: Radiation Oncology | Admitting: Radiation Oncology

## 2020-03-10 DIAGNOSIS — C7931 Secondary malignant neoplasm of brain: Secondary | ICD-10-CM | POA: Diagnosis not present

## 2020-03-11 ENCOUNTER — Ambulatory Visit (HOSPITAL_COMMUNITY): Payer: 59

## 2020-03-11 ENCOUNTER — Other Ambulatory Visit: Payer: Self-pay

## 2020-03-11 ENCOUNTER — Ambulatory Visit
Admission: RE | Admit: 2020-03-11 | Discharge: 2020-03-11 | Disposition: A | Discharge status: Home / Self Care | Payer: 59 | Source: Ambulatory Visit | Attending: Radiation Oncology | Admitting: Radiation Oncology

## 2020-03-11 DIAGNOSIS — C7931 Secondary malignant neoplasm of brain: Secondary | ICD-10-CM | POA: Diagnosis not present

## 2020-03-12 ENCOUNTER — Telehealth: Payer: Self-pay | Admitting: *Deleted

## 2020-03-12 ENCOUNTER — Other Ambulatory Visit: Payer: Self-pay

## 2020-03-12 ENCOUNTER — Encounter: Payer: Self-pay | Admitting: *Deleted

## 2020-03-12 ENCOUNTER — Encounter: Payer: Self-pay | Admitting: Adult Health

## 2020-03-12 ENCOUNTER — Ambulatory Visit
Admission: RE | Admit: 2020-03-12 | Discharge: 2020-03-12 | Disposition: A | Discharge status: Home / Self Care | Payer: 59 | Source: Ambulatory Visit | Attending: Radiation Oncology | Admitting: Radiation Oncology

## 2020-03-12 ENCOUNTER — Inpatient Hospital Stay: Payer: 59 | Attending: Hematology and Oncology | Admitting: Adult Health

## 2020-03-12 VITALS — BP 112/66 | HR 96 | Temp 97.9°F | Resp 18 | Ht 67.0 in | Wt 137.8 lb

## 2020-03-12 DIAGNOSIS — C7951 Secondary malignant neoplasm of bone: Secondary | ICD-10-CM | POA: Insufficient documentation

## 2020-03-12 DIAGNOSIS — C7931 Secondary malignant neoplasm of brain: Secondary | ICD-10-CM | POA: Diagnosis not present

## 2020-03-12 DIAGNOSIS — M898X9 Other specified disorders of bone, unspecified site: Secondary | ICD-10-CM | POA: Diagnosis not present

## 2020-03-12 DIAGNOSIS — Z9013 Acquired absence of bilateral breasts and nipples: Secondary | ICD-10-CM | POA: Diagnosis not present

## 2020-03-12 DIAGNOSIS — M545 Low back pain, unspecified: Secondary | ICD-10-CM | POA: Insufficient documentation

## 2020-03-12 DIAGNOSIS — C50512 Malignant neoplasm of lower-outer quadrant of left female breast: Secondary | ICD-10-CM

## 2020-03-12 DIAGNOSIS — R21 Rash and other nonspecific skin eruption: Secondary | ICD-10-CM | POA: Insufficient documentation

## 2020-03-12 DIAGNOSIS — Z171 Estrogen receptor negative status [ER-]: Secondary | ICD-10-CM | POA: Diagnosis not present

## 2020-03-12 DIAGNOSIS — Z95828 Presence of other vascular implants and grafts: Secondary | ICD-10-CM | POA: Diagnosis not present

## 2020-03-12 DIAGNOSIS — Z993 Dependence on wheelchair: Secondary | ICD-10-CM | POA: Diagnosis not present

## 2020-03-12 MED ORDER — HYDROMORPHONE HCL 1 MG/ML IJ SOLN
INTRAMUSCULAR | Status: AC
  Filled 2020-03-12: qty 2

## 2020-03-12 MED ORDER — METHYLPREDNISOLONE SODIUM SUCC 125 MG IJ SOLR
INTRAMUSCULAR | Status: AC
  Filled 2020-03-12: qty 2

## 2020-03-12 MED ORDER — METHYLPREDNISOLONE 4 MG PO TBPK: ORAL_TABLET | ORAL | 1 refills | Status: DC

## 2020-03-12 MED ORDER — METHYLPREDNISOLONE SODIUM SUCC 125 MG IJ SOLR
60.0000 mg | Freq: Once | INTRAMUSCULAR | Status: AC
  Administered 2020-03-12: 60 mg via INTRAVENOUS

## 2020-03-12 MED ORDER — HYDROMORPHONE HCL 1 MG/ML IJ SOLN
2.0000 mg | Freq: Once | INTRAMUSCULAR | Status: AC
  Administered 2020-03-12: 2 mg via INTRAVENOUS

## 2020-03-12 MED ORDER — HEPARIN SOD (PORK) LOCK FLUSH 100 UNIT/ML IV SOLN
500.0000 [IU] | Freq: Once | INTRAVENOUS | Status: AC | PRN
  Administered 2020-03-12: 500 [IU]
  Filled 2020-03-12: qty 5

## 2020-03-12 MED ORDER — SODIUM CHLORIDE 0.9% FLUSH
10.0000 mL | INTRAVENOUS | Status: DC | PRN
  Filled 2020-03-12: qty 10

## 2020-03-12 MED ORDER — OXYCODONE-ACETAMINOPHEN 10-325 MG PO TABS: 1.0000 | ORAL_TABLET | ORAL | 0 refills | Status: DC | PRN

## 2020-03-12 NOTE — Telephone Encounter (Signed)
Per Dr.Gudena, called [facility] to make appt to be seen by [facility]. Message was left on voicemail of [place]. Advised to call [place] desk nurse back with information.

## 2020-03-12 NOTE — Telephone Encounter (Signed)
Received after hours message from pt with complaint in metastatic bone pain unrelieved by gabapentin and methadone.  Pt requesting to be seen by MD.  RN spoke with Wilber Bihari, NP and pt scheduled to be seen in office today.  Apt scheduled and pt verbalized understanding of apt date and time.

## 2020-03-12 NOTE — Assessment & Plan Note (Signed)
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 Neoadjuvant chemotherapy with TCHPcompleted 02/17/2017 this would be followed by Herceptinand Perjetamaintenance for 1 yearcompleted September 2019 2.Bilateral mastectomies 03/28/2016:Bilateral mastectomies: Left mastectomy: IDC grade 2 0.9 cm, nodes negative, right mastectomy benign, ER 0%, PR 0%, HER-2 positive ratio 2.6 3.Adjuvant radiation4/08/2017 to 06/09/2017 4.Neratinib started 10/12/2017 discontinued due to diarrhea 5. Elbow fracture: Due to metastatic disease, palliative radiation therapy 6. Carboplatin atezolizumab at [facility] on a clinical trial [facility] 043 stopped for progression 12/04/2018 7.[name] discontinued because of progression 8.Xeloda started 10/1/2021discontinued 12/19/2019 for progression 9.  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  Improvement of pre-existing pontine, right cerebellar and right temporal lesions.  She is undergoing whole brain raidation, and will continue this.  She is having increased pain in her lower back.  She will receive 34m IV dilaudid today, and will start taking methadone 130mTID.  I reviewed the importance on staying on the 1038mID dose for 5-7 days before titrating it any further.  I refilled her percocet today as she is due.  [facility] and perhaps  a nerve block for her pain.  For her rash I sent in a medrol dosepak and she will receive IV Solumedrol today.  She met with Dr. GudLindi Adieday also who helped create this plan.  She is able to stop medications that are not related to pain, and I reviewed with her and we stopped the thyroid medications today.  She would like to remain on the Effexor and the Vyvanse.    [place] and will call with her medication needs prior to that trip.

## 2020-03-12 NOTE — Telephone Encounter (Signed)
  Oncology Nurse Navigator Documentation  Navigator Location: [facility] (03/12/20 1400)   )Navigator Encounter Type: Appt/Treatment Plan Review (03/12/20 1400)                     Patient Visit Type: [facility] (03/12/20 1400) Treatment Phase: Treatment (03/12/20 1400)

## 2020-03-12 NOTE — Progress Notes (Incomplete)
Patient Care Team: [name], MD as PCP - General (Family Medicine) Mauro Kaufmann, RN as Oncology Nurse Navigator Carlynn Spry, Charlott Holler, RN as Oncology Nurse Navigator [place], Aletha Halim, NP as Nurse Practitioner (Hospice and Palliative Medicine) Nicholas Lose, MD as Consulting Physician (Hematology and Oncology) Delice Bison, Charlestine Massed, NP as Nurse Practitioner (Hematology and Oncology) [place], [place] as Registered Nurse [facility] and Palliative Medicine)  DIAGNOSIS: No diagnosis found.  SUMMARY OF ONCOLOGIC HISTORY: 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, estrogen receptor negative ([place])  10/20/2016 Mammogram   Mammogram and ultrasound of the left breast revealed 1.7 cm mass at 4:00 position, 6:30 position 5 x 4 x 4 mm mass, 6:00 position 5 cm nipple 7 x 6 x 11 mm, left axillary lymph node with thickened cortex, T1c N1 stage II a AJCC 8   10/24/2016 Initial Diagnosis   Left breast biopsy 6:30 position 3 cm from nipple: IDC grade 2, DCIS, ER 0%, PR 0%, Ki-67 15% HER-2 positive ratio 2.1; 4:00 position 3 cm from nipple: IDC grade 2, DCIS, ER 0%, PR 0%, Ki-67 35%, HER-2 positive ratio 2.02; left axillary lymph node biopsy positive   11/04/2016 - 02/17/2017 Neo-Adjuvant Chemotherapy   TCH Perjeta 6 cycles followed by Herceptin + Perjeta maintenance to be completed September 2019   11/30/2016 Genetic Testing   Negative genetic testing on the common hereditary cancer panel.  The Hereditary Gene Panel offered by [facility] includes sequencing and/or deletion duplication testing of the following 47 genes: APC, ATM, AXIN2, BARD1, BMPR1A, BRCA1, BRCA2, BRIP1, CDH1, CDK4, CDKN2A (p14ARF), CDKN2A (p16INK4a), CHEK2, CTNNA1, DICER1, EPCAM (Deletion/duplication testing only), GREM1 (promoter region deletion/duplication testing only), KIT, MEN1, MLH1, MSH2, MSH3, MSH6, MUTYH, NBN, NF1, NHTL1, PALB2, PDGFRA, PMS2, POLD1, POLE, PTEN, RAD50, RAD51C,  RAD51D, SDHB, SDHC, SDHD, SMAD4, SMARCA4. STK11, TP53, TSC1, TSC2, and VHL.  The following genes were evaluated for sequence changes only: SDHA and HOXB13 c.251G>A variant only. The report date is November 30, 2016.    03/27/2017 Surgery   Bilateral mastectomies: Left mastectomy: IDC grade 2 0.9 cm, nodes negative, right mastectomy benign, ER 0%, PR 0%, HER-2 positive ratio 2.6   05/08/2017 - 06/09/2017 Radiation Therapy   Adjuvant radiation therapy   10/23/2017 Miscellaneous   Neratinib discontinued after 4 weeks for severe diarrhea   07/25/2018 Relapse/Recurrence   MRI of right elbow showed bone lesion consistent with malignancy. PET scan showed bilateral pulmonary nodules and several lytic bone lesions compatible with metastatic disease. Brain MRI on 08/02/18 showed no evidence of metastatic disease.   08/02/2018 PET scan   Bilateral hypermetabolic lung nodules, LUL 1.3 cm with SUV 3.88, lingular nodule 1.4 cm SUV 3.7, central lingular nodule 1.2 cm SUV 9.76, right middle lobe nodule 1.5 cm SUV 9.9, lytic bone metastases inferior pubic ramus, sacrum, T12, right 11th rib.   08/08/2018 Procedure   Lung biopsy: metastatic carcinoma, HER-2 negative (0), ER/PR negative.   08/10/2018 -  Radiation Therapy   Palliative radiatio to the right humerus along the medial condyle   08/24/2018 - 09/05/2018 Radiation Therapy   Palliative radiation to the right 11th rib and right elbow   09/26/2018 - 12/04/2018 Chemotherapy   Carboplatin atezolizumab at [facility] with Dr. 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   04/11/2019 -  Chemotherapy   Sacituzumab-Govitecan [name])   Bone  metastases ([place])  08/07/2018 Initial Diagnosis   Bone metastases ([place])   12/14/2018 - 03/28/2019 Chemotherapy   The patient had pertuzumab (PERJETA) 420 mg in sodium chloride 0.9 % 250 mL chemo infusion, 420 mg (100 % of original dose 420 mg), Intravenous, Once, 5 of 6 cycles Dose modification: 420 mg (original dose 420 mg, Cycle 1, Reason: Provider Judgment) Administration: 420 mg (12/14/2018), 420 mg (01/10/2019), 420 mg (03/28/2019), 420 mg (01/30/2019), 420 mg (03/01/2019) trastuzumab-dkst (OGIVRI) 600 mg in sodium chloride 0.9 % 250 mL chemo infusion, 609 mg, Intravenous,  Once, 5 of 6 cycles Administration: 600 mg (12/14/2018), 450 mg (01/10/2019), 450 mg (03/28/2019), 450 mg (01/30/2019), 450 mg (03/01/2019)  for chemotherapy treatment.    12/17/2018 -  Radiation Therapy   Palliative radiation to sternal, sacral & pelvic lesions and SRS for T3 and C7-T1 lesions.     CHIEF COMPLIANT: Follow-up of metastatic breast cancer  INTERVAL HISTORY: Laura Anthony is a 45 y.o. with above-mentioned history of metastatic breast cancer who is currently receiving whole brain radiation and has denied any further systemic treatment. She presents to the clinic today for follow-up.   ALLERGIES:  is allergic to denosumab, statins, sumatriptan, and tape.  MEDICATIONS:  Current Outpatient Medications  Medication Sig Dispense Refill  . albuterol (VENTOLIN HFA) 108 (90 Base) MCG/ACT inhaler Inhale 1-2 puffs into the lungs every 6 (six) hours as needed for wheezing or shortness of breath. 18 g 6  . celecoxib (CELEBREX) 100 MG capsule Take 1 capsule (100 mg total) by mouth 2 (two) times daily. 60 capsule 3  . fluticasone (FLOVENT HFA) 110 MCG/ACT inhaler Inhale 2 puffs into the lungs in the morning and at bedtime. 1 each 12  . gabapentin (NEURONTIN) 300 MG capsule 2 capsules twice daily 120 capsule 5  . lidocaine-prilocaine (EMLA) cream Apply topically daily.    Marland Kitchen lisdexamfetamine (VYVANSE) 40 MG  capsule Take 1 capsule (40 mg total) by mouth every morning. 30 capsule 0  . LORazepam (ATIVAN) 0.5 MG tablet Take 1 tablet (0.5 mg total) by mouth every 6 (six) hours as needed for anxiety (nausea). 45 tablet 1  . methadone (DOLOPHINE) 10 MG tablet Take 1 tablet (10 mg total) by mouth every 8 (eight) hours. 90 tablet 0  . methylPREDNISolone (MEDROL DOSEPAK) 4 MG TBPK tablet Taper 6,5,4,3,2,1 21 tablet 1  . naloxone (NARCAN) 4 MG/0.1ML LIQD nasal spray kit Spray once in one nostril prn overdose, may repeat x 1 1 each 1  . oxyCODONE-acetaminophen (PERCOCET) 10-325 MG tablet Take 1-1.5 tablets by mouth every 4 (four) hours as needed for pain. 180 tablet 0  . pantoprazole (PROTONIX) 40 MG tablet TAKE 1 TABLET BY MOUTH TWICE A DAY 180 tablet 1  . prochlorperazine (COMPAZINE) 10 MG tablet Take 1 tablet (10 mg total) by mouth every 6 (six) hours as needed (Nausea or vomiting). 30 tablet 1  . venlafaxine XR (EFFEXOR-XR) 150 MG 24 hr capsule TAKE 1 CAPSULE (150 MG TOTAL) BY MOUTH DAILY WITH BREAKFAST. 90 capsule 3   No current facility-administered medications for this visit.   Facility-Administered Medications Ordered in Other Visits  Medication Dose Route Frequency Provider Last Rate Last Admin  . sodium chloride flush (NS) 0.9 % injection 10 mL  10 mL Intracatheter PRN Nicholas Lose, MD  PHYSICAL EXAMINATION: ECOG PERFORMANCE STATUS: {CHL ONC ECOG PS:[id number]}  There were no vitals filed for this visit. There were no vitals filed for this visit.  LABORATORY DATA:  I have reviewed the data as listed CMP Latest Ref Rng & Units 02/28/2020 02/18/2020 02/14/2020  Glucose 70 - 99 mg/dL 127(H) 107(H) 101(H)  BUN 6 - 20 mg/dL 15 21(H) 20  Creatinine 0.44 - 1.00 mg/dL 0.83 0.83 0.82  Sodium 135 - 145 mmol/L 139 137 140  Potassium 3.5 - 5.1 mmol/L 3.9 3.8 4.4  Chloride 98 - 111 mmol/L 99 98 101  CO2 22 - 32 mmol/L 30 29 28   Calcium 8.9 - 10.3 mg/dL 10.3 9.9 10.4(H)  Total Protein 6.5 - 8.1  g/dL 7.6 7.5 8.0  Total Bilirubin 0.3 - 1.2 mg/dL 0.4 0.7 0.6  Alkaline Phos 38 - 126 U/L 278(H) 298(H) 420(H)  AST 15 - 41 U/L 30 46(H) 79(H)  ALT 0 - 44 U/L 21 42 68(H)    Lab Results  Component Value Date   WBC 3.9 (L) 02/28/2020   HGB 7.7 (L) 02/28/2020   HCT 23.9 (L) 02/28/2020   MCV 88.8 02/28/2020   PLT 66 (L) 02/28/2020   NEUTROABS 2.4 02/28/2020    ASSESSMENT & PLAN:  No problem-specific Assessment & Plan notes found for this encounter.    No orders of the defined types were placed in this encounter.  The patient has a good understanding of the overall plan. she agrees with it. she will call with any problems that may develop before the next visit here.  Total time spent: *** mins including face to face time and time spent for planning, charting and coordination of care  Rulon Eisenmenger, MD, MPH 03/12/2020  I, Molly Dorshimer, am acting as scribe for Dr. Nicholas Lose.  {insert scribe attestation}

## 2020-03-12 NOTE — Progress Notes (Signed)
[facility] Follow up:    Laura Noon, MD [place] [address]   DIAGNOSIS: Cancer Staging Malignant neoplasm of lower-outer quadrant of left breast of female, estrogen receptor negative ([place]) Staging form: Breast, AJCC 8th Edition - Clinical: Stage IIA (cT1c, cN1, cM0, G2, ER: Negative, PR: Negative, HER2: Positive) - Signed by Gardenia Phlegm, NP on 11/02/2016 Histologic grading system: 3 grade system - Pathologic: No Stage Recommended (ypT1b, pN0, cM0, G2, ER: Negative, PR: Negative, HER2: Positive) - Unsigned Stage prefix: Post-therapy Neoadjuvant therapy: Yes Histologic grading system: 3 grade system   SUMMARY OF ONCOLOGIC HISTORY: 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, estrogen receptor negative ([place])  10/20/2016 Mammogram   Mammogram and ultrasound of the left breast revealed 1.7 cm mass at 4:00 position, 6:30 position 5 x 4 x 4 mm mass, 6:00 position 5 cm nipple 7 x 6 x 11 mm, left axillary lymph node with thickened cortex, T1c N1 stage II a AJCC 8   10/24/2016 Initial Diagnosis   Left breast biopsy 6:30 position 3 cm from nipple: IDC grade 2, DCIS, ER 0%, PR 0%, Ki-67 15% HER-2 positive ratio 2.1; 4:00 position 3 cm from nipple: IDC grade 2, DCIS, ER 0%, PR 0%, Ki-67 35%, HER-2 positive ratio 2.02; left axillary lymph node biopsy positive   11/04/2016 - 02/17/2017 Neo-Adjuvant Chemotherapy   TCH Perjeta 6 cycles followed by Herceptin + Perjeta maintenance to be completed September 2019   11/30/2016 Genetic Testing   Negative genetic testing on the common hereditary cancer panel.  The Hereditary Gene Panel offered by [facility] includes sequencing and/or deletion duplication testing of the following 47 genes: APC, ATM, AXIN2, BARD1, BMPR1A, BRCA1, BRCA2, BRIP1, CDH1, CDK4, CDKN2A (p14ARF), CDKN2A (p16INK4a), CHEK2, CTNNA1, DICER1, EPCAM (Deletion/duplication testing only), GREM1  (promoter region deletion/duplication testing only), KIT, MEN1, MLH1, MSH2, MSH3, MSH6, MUTYH, NBN, NF1, NHTL1, PALB2, PDGFRA, PMS2, POLD1, POLE, PTEN, RAD50, RAD51C, RAD51D, SDHB, SDHC, SDHD, SMAD4, SMARCA4. STK11, TP53, TSC1, TSC2, and VHL.  The following genes were evaluated for sequence changes only: SDHA and HOXB13 c.251G>A variant only. The report date is November 30, 2016.    03/27/2017 Surgery   Bilateral mastectomies: Left mastectomy: IDC grade 2 0.9 cm, nodes negative, right mastectomy benign, ER 0%, PR 0%, HER-2 positive ratio 2.6   05/08/2017 - 06/09/2017 Radiation Therapy   Adjuvant radiation therapy   10/23/2017 Miscellaneous   Neratinib discontinued after 4 weeks for severe diarrhea   07/25/2018 Relapse/Recurrence   MRI of right elbow showed bone lesion consistent with malignancy. PET scan showed bilateral pulmonary nodules and several lytic bone lesions compatible with metastatic disease. Brain MRI on 08/02/18 showed no evidence of metastatic disease.   08/02/2018 PET scan   Bilateral hypermetabolic lung nodules, LUL 1.3 cm with SUV 3.88, lingular nodule 1.4 cm SUV 3.7, central lingular nodule 1.2 cm SUV 9.76, right middle lobe nodule 1.5 cm SUV 9.9, lytic bone metastases inferior pubic ramus, sacrum, T12, right 11th rib.   08/08/2018 Procedure   Lung biopsy: metastatic carcinoma, HER-2 negative (0), ER/PR negative.   08/10/2018 -  Radiation Therapy   Palliative radiatio to the right humerus along the medial condyle   08/24/2018 - 09/05/2018 Radiation Therapy   Palliative radiation to the right 11th rib and right elbow   09/26/2018 - 12/04/2018 Chemotherapy   Carboplatin atezolizumab at [facility] with Dr. Janan Halter on [place] clinical trial stopped because of  new T5 metastases (toxicities included myopathy required prednisone, immune mediated thyroiditis), right upper extremity DVT on apixaban   12/14/2018 - 04/10/2019 Chemotherapy   [place]   12/17/2018 -  Radiation Therapy    Palliative radiation to sternal, sacral & pelvic lesions and SRS for T3 and C7-T1 lesions.   04/11/2019 -  Chemotherapy   Sacituzumab-Govitecan [name])   Bone metastases ([place])  08/07/2018 Initial Diagnosis   Bone metastases ([place])   12/14/2018 - 03/28/2019 Chemotherapy   The patient had pertuzumab (PERJETA) 420 mg in sodium chloride 0.9 % 250 mL chemo infusion, 420 mg (100 % of original dose 420 mg), Intravenous, Once, 5 of 6 cycles Dose modification: 420 mg (original dose 420 mg, Cycle 1, Reason: Provider Judgment) Administration: 420 mg (12/14/2018), 420 mg (01/10/2019), 420 mg (03/28/2019), 420 mg (01/30/2019), 420 mg (03/01/2019) trastuzumab-dkst (OGIVRI) 600 mg in sodium chloride 0.9 % 250 mL chemo infusion, 609 mg, Intravenous,  Once, 5 of 6 cycles Administration: 600 mg (12/14/2018), 450 mg (01/10/2019), 450 mg (03/28/2019), 450 mg (01/30/2019), 450 mg (03/01/2019)  for chemotherapy treatment.    12/17/2018 -  Radiation Therapy   Palliative radiation to sternal, sacral & pelvic lesions and SRS for T3 and C7-T1 lesions.     CURRENT THERAPY: whole brain radiation.    INTERVAL HISTORY: Laura Anthony 45 y.o. female returns for an urgent appointment regarding her increased pain in her bones.  She underwent a total spine MRI that noted T3 pathologic fracture, and multifocal metastatic lesions through the spine.  She has previous received palliative radiation in 12/2018 to her metastatic lesions.  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  She is receiving whole brain radiation, however has declined a repeat CT scan of her body as she does not wish to pursue future systemic treatment.    She has been having 10/10 pain in her back since last night and notes that it has been difficult for her to get comfortable.  She also has had a rash on her upper arms and it is  itching and red.  She hasn't started any new medications.  She has generalized leg weakness but no numbness/tingling, or bowel/bladder incontinence.    Patient Active Problem List   Diagnosis Date Noted  . Brain metastases ([place]) 02/29/2020  . Pneumonia due to COVID-19 virus 07/22/2019  . 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 Palliative care patient 01/06/2019  . Inadequate pain control 01/02/2019  . Acute embolism and thrombosis of unspecified deep veins of unspecified lower extremity ([place]) 12/26/2018  . Neutropenic fever ([place]) 12/26/2018  . Sorethroat 12/26/2018  . Stenosis of brachiocephalic vein 74/25/9563  . 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 Neck pain 12/09/2018  . Hypothyroidism 12/09/2018  . Goals of care, counseling/discussion 12/06/2018  . Metastatic breast cancer ([place]) 08/08/2018  . Bone metastases ([place]) 08/07/2018  . Pain from bone metastases ([place]) 08/07/2018  . Family history of breast cancer 04/02/2018  . Family history of prostate cancer 04/02/2018  . History of therapeutic radiation 10/16/2017  . Acquired absence of both breasts 04/03/2017  . Breast cancer, stage 2, left ([place]) 03/27/2017  . Port-A-Cath in place 12/16/2016  . Genetic testing 12/06/2016  . Encounter for antineoplastic chemotherapy 11/25/2016  . Malignant neoplasm of lower-outer quadrant of left breast of female, estrogen receptor negative ([place]) 10/27/2016  . Breast lump on left side at  1 o'clock position 05/22/2015  . Migraine without aura or status migrainosus 05/29/2014  . Hyperlipidemia LDL goal <100 10/22/2013  . Depression 09/09/2013  . Rotator cuff impingement syndrome of left shoulder 07/12/2013  . ADD (attention deficit disorder) 01/09/2013  . Anxiety 12/13/2012  . Insomnia 12/13/2012    is allergic to denosumab, statins, sumatriptan, and tape.  MEDICAL HISTORY: Past Medical History:  Diagnosis Date  . ADD (attention deficit disorder)   . Anemia    . Anxiety   . Breast cancer, left breast ([place])    S/P mastectomy 03/27/2017  . DVT (deep venous thrombosis) ([place]) 2017   calf left - probably due to [facility] pills-took eliquis x3 mos, nonthing now  . High cholesterol   . Impingement syndrome of right shoulder 07/2013  . Migraine    "usually 1/month" (03/28/2017)  . PONV (postoperative nausea and vomiting)   . Right bicipital tenosynovitis 07/2013  . Rotator cuff impingement syndrome of right shoulder 07/12/2013  . Seizures ([place])    x 1 as a child - was never on anticonvulsants (03/28/2017)    SURGICAL HISTORY: Past Surgical History:  Procedure Laterality Date  . ADENOIDECTOMY  1981  . ANKLE ARTHROSCOPY Right   . BREAST BIOPSY Left 10/2016  . KNEE ARTHROSCOPY Right   . KNEE ARTHROSCOPY W/ ACL RECONSTRUCTION Left   . LIPOSUCTION WITH LIPOFILLING Bilateral 01/05/2018   Procedure: LIPOFILLING FROM ABDOMEN TO BILATERAL CHEST;  Surgeon: Irene Limbo, MD;  Location: [place];  Service: Plastics;  Laterality: Bilateral;  . MASTECTOMY Left 03/27/2017   NIPPLE SPARING MASTECTOMY WITH RADIOACTIVE SEED TARGETED LYMPH NODE EXCISION AND LEFT AXILLARY SENTINEL LYMPH NODE BIOPSY  . MASTECTOMY Right 03/27/2017   RIGHT PROPHYLACTIC NIPPLE SPARING MASTECTOMY  . NIPPLE SPARING MASTECTOMY Right 03/27/2017   Procedure: RIGHT PROPHYLACTIC NIPPLE SPARING MASTECTOMY;  Surgeon: Rolm Bookbinder, MD;  Location: [place];  Service: General;  Laterality: Right;  . PORT-A-CATH REMOVAL Right 01/05/2018   Procedure: REMOVAL RIGHT CHEST PORT;  Surgeon: Irene Limbo, MD;  Location: [place];  Service: Plastics;  Laterality: Right;  . PORTACATH PLACEMENT N/A 11/01/2016   Procedure: INSERTION PORT-A-CATH WITH [place];  Surgeon: Rolm Bookbinder, MD;  Location: [place];  Service: General;  Laterality: N/A;  . PORTACATH PLACEMENT N/A 08/23/2018   Procedure: INSERTION PORT-A-CATH WITH ULTRASOUND;  Surgeon: Rolm Bookbinder, MD;  Location: [place];  Service: General;  Laterality: N/A;  . RADIOACTIVE SEED GUIDED AXILLARY SENTINEL LYMPH NODE Left 03/27/2017   Procedure: LEFT NIPPLE SPARING MASTECTOMY WITH RADIOACTIVE SEED TARGETED LYMPH NODE EXCISION AND LEFT AXILLARY SENTINEL LYMPH NODE BIOPSY;  Surgeon: Rolm Bookbinder, MD;  Location: [place];  Service: General;  Laterality: Left;  REQUESTS RNFA  . RECONSTRUCTION BREAST IMMEDIATE / DELAYED W/ TISSUE EXPANDER Bilateral 03/27/2017   BILATERAL BREAST RECONSTRUCTION WITH PLACEMENT OF TISSUE EXPANDER AND ALLODERM  . REMOVAL OF BILATERAL TISSUE EXPANDERS WITH PLACEMENT OF BILATERAL BREAST IMPLANTS Bilateral 01/05/2018   Procedure: REMOVAL OF BILATERAL TISSUE EXPANDERS WITH PLACEMENT OF BILATERAL BREAST IMPLANTS;  Surgeon: Irene Limbo, MD;  Location: [place];  Service: Plastics;  Laterality: Bilateral;  . SEPTOPLASTY WITH ETHMOIDECTOMY, AND MAXILLARY ANTROSTOMY  10/29/2010   bilat. max. antrostomy with left max. stripping; left ant. ethmoidectomy; right total ethmoidectomy; sphenoidotomy  . SHOULDER ARTHROSCOPY WITH SUBACROMIAL DECOMPRESSION AND BICEP TENDON REPAIR Right 07/12/2013   Procedure: RIGHT SHOULDER ARTHROSCOPY DEBRIDEMENT EXTENSIVE DECOMPRESSION SUBACROMIAL PARTIAL ACROMIOPLASTY;  Surgeon: Johnny Bridge, MD;  Location:  [place];  Service: Orthopedics;  Laterality: Right;  . WRIST ARTHROSCOPY  01/17/2012   Procedure: ARTHROSCOPY WRIST; right wrist Surgeon: Tennis Must, MD;  Location: [place];  Service: Orthopedics;  Laterality: Right;  RIGHT WRIST ARTHROSCOPY WITH TRIANGULAR FIBROCARTILAGE COMPLEX REPAIR AND DEBRIDEMENT     SOCIAL HISTORY: Social History   Socioeconomic History  . Marital status: Married    Spouse name: Not on file  . Number of children: Not on file  . Years of education: Not on file  . Highest education level: Not on file  Occupational  History  . Not on file  Tobacco Use  . Smoking status: Never Smoker  . Smokeless tobacco: Never Used  Vaping Use  . Vaping Use: Never used  Substance and Sexual Activity  . Alcohol use: Yes    Comment: Drinks very rare  . Drug use: No  . Sexual activity: Yes    Birth control/protection: Surgical    Comment: husband has had a vasectomy  Other Topics Concern  . Not on file  Social History Narrative  . Not on file   Social Determinants of Health   Financial Resource Strain: Not on file  Food Insecurity: Not on file  Transportation Needs: Not on file  Physical Activity: Not on file  Stress: Not on file  Social Connections: Not on file  Intimate Partner Violence: Not on file    FAMILY HISTORY: Family History  Problem Relation Age of Onset  . Breast cancer Mother 39       triple negative  . Leukemia Father   . Lung cancer Father   . Heart attack Maternal Uncle   . Prostate cancer Paternal Uncle   . COPD Paternal Grandmother   . Heart disease Paternal Grandfather   . Prostate cancer Paternal Uncle   . Leukemia Cousin     Review of Systems  Constitutional: Positive for fatigue. Negative for appetite change, chills, fever and unexpected weight change.  HENT:   Negative for hearing loss, lump/mass and trouble swallowing.   Eyes: Negative for eye problems and icterus.  Respiratory: Negative for chest tightness, cough and shortness of breath.   Cardiovascular: Negative for chest pain, leg swelling and palpitations.  Gastrointestinal: Negative for abdominal distention, abdominal pain, constipation, diarrhea, nausea and vomiting.  Endocrine: Negative for hot flashes.  Genitourinary: Negative for difficulty urinating.   Musculoskeletal: Positive for back pain. Negative for arthralgias.  Skin: Negative for itching and rash.  Neurological: Positive for extremity weakness (generalized). Negative for dizziness, headaches and numbness.  Hematological: Negative for adenopathy.  Does not bruise/bleed easily.  Psychiatric/Behavioral: Negative for depression. The patient is not nervous/anxious.       PHYSICAL EXAMINATION  ECOG PERFORMANCE STATUS: 2 - Symptomatic, <50% confined to bed  Vitals:   03/12/20 1252  BP: 112/66  Pulse: 96  Resp: 18  Temp: 97.9 F (36.6 C)  SpO2: 98%    Physical Exam Constitutional:      General: She is not in acute distress.    Appearance: Normal appearance. She is not toxic-appearing.     Comments: Sitting in wheelchair, in obvious pain intermittently depending on position   HENT:     Head: Normocephalic and atraumatic.  Eyes:     General: No scleral icterus. Cardiovascular:     Rate and Rhythm: Normal rate and regular rhythm.     Pulses: Normal pulses.     Heart sounds: Normal heart sounds.  Pulmonary:  Effort: Pulmonary effort is normal.     Breath sounds: Normal breath sounds.  Abdominal:     General: Abdomen is flat. Bowel sounds are normal. There is no distension.     Palpations: Abdomen is soft.     Tenderness: There is no abdominal tenderness.  Musculoskeletal:        General: No swelling.     Cervical back: Neck supple.  Lymphadenopathy:     Cervical: No cervical adenopathy.  Skin:    General: Skin is warm and dry.     Findings: No rash.  Neurological:     General: No focal deficit present.     Mental Status: She is alert.  Psychiatric:        Mood and Affect: Mood normal.        Behavior: Behavior normal.     LABORATORY DATA:  CBC    Component Value Date/Time   WBC 3.9 (L) 02/28/2020 1025   WBC 2.6 (L) 07/25/2019 0519   RBC 2.69 (L) 02/28/2020 1025   HGB 7.7 (L) 02/28/2020 1025   HGB 11.1 (L) 01/27/2017 0802   HCT 23.9 (L) 02/28/2020 1025   HCT 33.4 (L) 01/27/2017 0802   PLT 66 (L) 02/28/2020 1025   PLT 170 01/27/2017 0802   MCV 88.8 02/28/2020 1025   MCV 99.0 01/27/2017 0802   MCH 28.6 02/28/2020 1025   MCHC 32.2 02/28/2020 1025   RDW 18.2 (H) 02/28/2020 1025   RDW 18.2 (H)  01/27/2017 0802   LYMPHSABS 0.8 02/28/2020 1025   LYMPHSABS 1.7 01/27/2017 0802   MONOABS 0.6 02/28/2020 1025   MONOABS 0.5 01/27/2017 0802   EOSABS 0.0 02/28/2020 1025   EOSABS 0.0 01/27/2017 0802   BASOSABS 0.0 02/28/2020 1025   BASOSABS 0.0 01/27/2017 0802    CMP     Component Value Date/Time   NA 139 02/28/2020 1025   NA 138 01/27/2017 0802   K 3.9 02/28/2020 1025   K 4.1 01/27/2017 0802   CL 99 02/28/2020 1025   CO2 30 02/28/2020 1025   CO2 26 01/27/2017 0802   GLUCOSE 127 (H) 02/28/2020 1025   GLUCOSE 90 01/27/2017 0802   BUN 15 02/28/2020 1025   BUN 14.0 01/27/2017 0802   CREATININE 0.83 02/28/2020 1025   CREATININE 0.7 01/27/2017 0802   CALCIUM 10.3 02/28/2020 1025   CALCIUM 8.6 01/27/2017 0802   PROT 7.6 02/28/2020 1025   PROT 6.5 01/27/2017 0802   ALBUMIN 3.0 (L) 02/28/2020 1025   ALBUMIN 3.5 01/27/2017 0802   AST 30 02/28/2020 1025   AST 22 01/27/2017 0802   ALT 21 02/28/2020 1025   ALT 23 01/27/2017 0802   ALKPHOS 278 (H) 02/28/2020 1025   ALKPHOS 85 01/27/2017 0802   BILITOT 0.4 02/28/2020 1025   BILITOT 0.23 01/27/2017 0802   GFRNONAA >60 02/28/2020 1025   GFRAA >60 10/24/2019 0805          ASSESSMENT and PLAN:   Malignant neoplasm of lower-outer quadrant of left breast of female, estrogen receptor negative ([place]) 10/24/2016: Left breast biopsy 6:30 position 3 cm from nipple: IDC grade 2, DCIS, ER 0%, PR 0%, Ki-67 15% HER-2 positive ratio 2.1; 4:00 position 3 cm from nipple: IDC grade 2, DCIS, ER 0%, PR 0%, Ki-67 35%, HER-2 positive ratio 2.02 Lymph node biopsy positive  Treatment Summary: 1. Neoadjuvant chemotherapy with TCHPcompleted 02/17/2017 this would be followed by Herceptinand Perjetamaintenance for 1 yearcompleted September 2019 2.Bilateral mastectomies 03/28/2016:Bilateral mastectomies: Left mastectomy: IDC grade 2 0.9  cm, nodes negative, right mastectomy benign, ER 0%, PR 0%, HER-2 positive ratio 2.6 3.Adjuvant  radiation4/08/2017 to 06/09/2017 4.Neratinib started 10/12/2017 discontinued due to diarrhea 5. Elbow fracture: Due to metastatic disease, palliative radiation therapy 6. Carboplatin atezolizumab at [facility] on a clinical trial [facility] 043 stopped for progression 12/04/2018 7.[name] discontinued because of progression 8.Xeloda started 10/1/2021discontinued 12/19/2019 for progression 9.  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  Improvement of pre-existing pontine, right cerebellar and right temporal lesions.  She is undergoing whole brain raidation, and will continue this.  She is having increased pain in her lower back.  She will receive 12m IV dilaudid today, and will start taking methadone 143mTID.  I reviewed the importance on staying on the 1063mID dose for 5-7 days before titrating it any further.  I refilled her percocet today as she is due.  [facility] and perhaps a nerve block for her pain.  For her rash I sent in a medrol dosepak and she will receive IV Solumedrol today.  She met with Dr. GudLindi Adieday also who helped create this plan.  She is able to stop medications that are not related to pain, and I reviewed with her and we stopped the thyroid medications today.  She would like to remain on the Effexor and the Vyvanse.    [place] and will call with her medication needs prior to that trip.      All questions were answered. The patient knows to call the clinic with any problems, questions or concerns. We can certainly see the  patient much sooner if necessary.  Total encounter time: 30 minutes*  LinWilber BihariP 03/12/20 1:44 PM Medical Oncology and Hematology [id number]. [phone] Fax. [phone] Encounter Time as defined by the Centers for Medicare and Medicaid Services includes, in addition to the face-to-face time of a patient visit (documented in the note above) non-face-to-face time: obtaining and reviewing outside history, ordering and reviewing medications, tests or procedures, care coordination (communications with other health care professionals or caregivers) and documentation in the medical record.

## 2020-03-13 ENCOUNTER — Ambulatory Visit: Payer: 59 | Admitting: Adult Health

## 2020-03-13 ENCOUNTER — Ambulatory Visit: Payer: 59

## 2020-03-13 ENCOUNTER — Telehealth: Payer: Self-pay | Admitting: Adult Health

## 2020-03-13 ENCOUNTER — Encounter (HOSPITAL_COMMUNITY): Payer: Self-pay

## 2020-03-13 ENCOUNTER — Emergency Department (HOSPITAL_COMMUNITY)
Admission: EM | Admit: 2020-03-13 | Discharge: 2020-03-14 | Disposition: A | Discharge status: Home / Self Care | Payer: 59 | Attending: Emergency Medicine | Admitting: Emergency Medicine

## 2020-03-13 ENCOUNTER — Other Ambulatory Visit: Payer: Self-pay

## 2020-03-13 ENCOUNTER — Telehealth: Payer: Self-pay | Admitting: *Deleted

## 2020-03-13 ENCOUNTER — Inpatient Hospital Stay: Payer: 59 | Admitting: Hematology and Oncology

## 2020-03-13 ENCOUNTER — Emergency Department (HOSPITAL_COMMUNITY): Payer: 59

## 2020-03-13 DIAGNOSIS — E039 Hypothyroidism, unspecified: Secondary | ICD-10-CM | POA: Diagnosis not present

## 2020-03-13 DIAGNOSIS — Z8616 Personal history of COVID-19: Secondary | ICD-10-CM | POA: Insufficient documentation

## 2020-03-13 DIAGNOSIS — C50919 Malignant neoplasm of unspecified site of unspecified female breast: Secondary | ICD-10-CM

## 2020-03-13 DIAGNOSIS — C799 Secondary malignant neoplasm of unspecified site: Secondary | ICD-10-CM

## 2020-03-13 DIAGNOSIS — G893 Neoplasm related pain (acute) (chronic): Secondary | ICD-10-CM | POA: Insufficient documentation

## 2020-03-13 DIAGNOSIS — Z853 Personal history of malignant neoplasm of breast: Secondary | ICD-10-CM | POA: Insufficient documentation

## 2020-03-13 DIAGNOSIS — R52 Pain, unspecified: Secondary | ICD-10-CM | POA: Diagnosis not present

## 2020-03-13 DIAGNOSIS — Z515 Encounter for palliative care: Secondary | ICD-10-CM

## 2020-03-13 DIAGNOSIS — M545 Low back pain, unspecified: Secondary | ICD-10-CM | POA: Diagnosis present

## 2020-03-13 MED ORDER — HYDROMORPHONE HCL 2 MG PO TABS
8.0000 mg | ORAL_TABLET | Freq: Once | ORAL | Status: AC
  Administered 2020-03-13: 8 mg via ORAL
  Filled 2020-03-13: qty 4

## 2020-03-13 MED ORDER — HYDROMORPHONE HCL 2 MG PO TABS
8.0000 mg | ORAL_TABLET | Freq: Once | ORAL | Status: AC | PRN
Start: 2020-03-13 — End: 2020-03-13
  Administered 2020-03-13: 8 mg via ORAL
  Filled 2020-03-13: qty 4

## 2020-03-13 MED ORDER — HEPARIN SOD (PORK) LOCK FLUSH 100 UNIT/ML IV SOLN
500.0000 [IU] | Freq: Once | INTRAVENOUS | Status: AC
  Administered 2020-03-13: 500 [IU]
  Filled 2020-03-13: qty 5

## 2020-03-13 MED ORDER — MORPHINE SULFATE 30 MG PO TABS
30.0000 mg | ORAL_TABLET | ORAL | Status: DC | PRN
  Administered 2020-03-13: 30 mg via ORAL
  Filled 2020-03-13: qty 1

## 2020-03-13 MED ORDER — HYDROMORPHONE HCL 1 MG/ML IJ SOLN
1.0000 mg | Freq: Once | INTRAMUSCULAR | Status: AC
  Administered 2020-03-13: 1 mg via INTRAVENOUS
  Filled 2020-03-13: qty 1

## 2020-03-13 MED ORDER — HYDROMORPHONE HCL 4 MG PO TABS: 8.0000 mg | ORAL_TABLET | ORAL | 0 refills | Status: AC | PRN

## 2020-03-13 NOTE — ED Triage Notes (Signed)
Pt arrived via walk in, c/o lower back pain. Denies any trauma to area. Was seen at cancer center yesterday for same, given IV pain meds with little relief. Pain worsening overnight.  Hx stage 4 metastatic breast CA

## 2020-03-13 NOTE — Telephone Encounter (Signed)
No 2/10 los. No changes made to pt's schedule.

## 2020-03-13 NOTE — Discharge Instructions (Addendum)
We signed the ER for cancer related pain.  We are discharging you with high-dose Dilaudid for pain control.  --Please discontinue Percocets effective immediately when taking Dilaudid.  --Given that in general you are opioid nave, take only 4 mg of Dilaudid at 2 am (4-1/2 hours after your initial dose in the ER), and take additional 4 mg only if the pain is not improving after an hour.  The medications prescribed in the ER will only cover you for limited period of time.  Please call your palliative service or the oncologist immediately on Monday for further pain control.

## 2020-03-13 NOTE — ED Notes (Signed)
Dr. Kathrynn Humble discharging patient home with 8mg  oral Dilaudid for pain control. 1mg  IV Dilaudid given before discharge. Patient verbally understands and is following Doctor's order to take the oral 4mg  of Dilaudid at 2am and additional 4mg  if the pain is not controlled after an hour.

## 2020-03-13 NOTE — ED Notes (Signed)
Patient provided with sandwich and snacks.

## 2020-03-13 NOTE — Telephone Encounter (Signed)
Pt called with c/o pain to right side. Unable to put any pressure on it and pain 10/10. Mendel Ryder NP notified. Will bring pt in for pain medication and assessment. Pt notified to come in when able per [place].

## 2020-03-13 NOTE — Progress Notes (Signed)
Palliative care brief note  I saw and examined Laura Anthony and discussed case with Dr. Kathrynn Humble.  Would recommend trial of oral dilaudid 8mg  to see if rotation to oral dilaudid is more effective for pain control.  If not, I would recommend admission with plan for PCA overnight.  If she is admitted, will plan to follow up in AM.  Full consult to follow.  Micheline Rough, MD [place] [facility] [phone]

## 2020-03-13 NOTE — Telephone Encounter (Signed)
Pt called stating will be at [facility] soon. Took 13min to get to the care d/t severe pain beyond 10/10. Informed [place], NP Per Mendel Ryder pt to go to ED to be evaluated for pain crisis. Informed pt to go to ED. Received understanding.

## 2020-03-13 NOTE — ED Provider Notes (Addendum)
[facility] DEPT Provider Note   CSN: [id number] Arrival date & time: 03/13/20  1333     History Chief Complaint  Patient presents with  . Back Pain    Laura Anthony is a 45 y.o. female.  HPI    45 year old female comes in a chief complaint of back pain. Patient has history of advanced breast cancer.  She is currently on methadone and Percocet for pain.  Over the last 2-1/2 days however, her pain has become excruciating.  Her pain is located in the right lower back.  Pain is sharp, severe and relentless.  She has doubled her every 4 hour Percocet, but the pain remains pretty severe. Pt has no associated nausea, vomiting, seizures, loss of consciousness or new visual complains, weakness, numbness, dizziness or gait instability.  She was seen at the cancer center for radiation of her brain yesterday, they gave her 2 mg of Dilaudid at that time.  She could not sleep all night because of her pain despite taking the oral pain meds, and was advised to come to the ER.  Patient also reports that she is considering transitioning to hospice care, her goal is to be comfortable at home.   Past Medical History:  Diagnosis Date  . ADD (attention deficit disorder)   . Anemia   . Anxiety   . Breast cancer, left breast ([place])    S/P mastectomy 03/27/2017  . DVT (deep venous thrombosis) ([place]) 2017   calf left - probably due to [facility] pills-took eliquis x3 mos, nonthing now  . High cholesterol   . Impingement syndrome of right shoulder 07/2013  . Migraine    "usually 1/month" (03/28/2017)  . PONV (postoperative nausea and vomiting)   . Right bicipital tenosynovitis 07/2013  . Rotator cuff impingement syndrome of right shoulder 07/12/2013  . Seizures ([place])    x 1 as a child - was never on anticonvulsants (03/28/2017)    Patient Active Problem List   Diagnosis Date Noted  . Brain metastases ([place]) 02/29/2020  . Pneumonia due to COVID-19 virus 07/22/2019  .  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 Palliative care patient 01/06/2019  . Inadequate pain control 01/02/2019  . Acute embolism and thrombosis of unspecified deep veins of unspecified lower extremity ([place]) 12/26/2018  . Neutropenic fever ([place]) 12/26/2018  . Sorethroat 12/26/2018  . Stenosis of brachiocephalic vein 01/74/9449  . 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 Neck pain 12/09/2018  . Hypothyroidism 12/09/2018  . Goals of care, counseling/discussion 12/06/2018  . Metastatic breast cancer ([place]) 08/08/2018  . Bone metastases ([place]) 08/07/2018  . Pain from bone metastases ([place]) 08/07/2018  . Family history of breast cancer 04/02/2018  . Family history of prostate cancer 04/02/2018  . History of therapeutic radiation 10/16/2017  . Acquired absence of both breasts 04/03/2017  . Breast cancer, stage 2, left ([place]) 03/27/2017  . Port-A-Cath in place 12/16/2016  . Genetic testing 12/06/2016  . Encounter for antineoplastic chemotherapy 11/25/2016  . Malignant neoplasm of lower-outer quadrant of left breast of female, estrogen receptor negative ([place]) 10/27/2016  . Breast lump on left side at 1 o'clock position 05/22/2015  . Migraine without aura or status migrainosus 05/29/2014  . Hyperlipidemia LDL goal <100 10/22/2013  . Depression 09/09/2013  . Rotator cuff impingement syndrome of left shoulder 07/12/2013  . ADD (attention deficit disorder) 01/09/2013  . Anxiety 12/13/2012  . Insomnia 12/13/2012    Past Surgical History:  Procedure Laterality Date  . ADENOIDECTOMY  1981  . ANKLE ARTHROSCOPY Right   . BREAST BIOPSY Left 10/2016  . KNEE ARTHROSCOPY Right   . KNEE ARTHROSCOPY W/ ACL RECONSTRUCTION Left   . LIPOSUCTION WITH LIPOFILLING Bilateral 01/05/2018   Procedure: LIPOFILLING FROM ABDOMEN TO BILATERAL CHEST;  Surgeon: Irene Limbo, MD;  Location: [facility];  Service: Plastics;  Laterality: Bilateral;  . MASTECTOMY Left  03/27/2017   NIPPLE SPARING MASTECTOMY WITH RADIOACTIVE SEED TARGETED LYMPH NODE EXCISION AND LEFT AXILLARY SENTINEL LYMPH NODE BIOPSY  . MASTECTOMY Right 03/27/2017   RIGHT PROPHYLACTIC NIPPLE SPARING MASTECTOMY  . NIPPLE SPARING MASTECTOMY Right 03/27/2017   Procedure: RIGHT PROPHYLACTIC NIPPLE SPARING MASTECTOMY;  Surgeon: Rolm Bookbinder, MD;  Location: [facility];  Service: General;  Laterality: Right;  . PORT-A-CATH REMOVAL Right 01/05/2018   Procedure: REMOVAL RIGHT CHEST PORT;  Surgeon: Irene Limbo, MD;  Location: [facility];  Service: Plastics;  Laterality: Right;  . PORTACATH PLACEMENT N/A 11/01/2016   Procedure: INSERTION PORT-A-CATH WITH [place];  Surgeon: Rolm Bookbinder, MD;  Location: [facility];  Service: General;  Laterality: N/A;  . PORTACATH PLACEMENT N/A 08/23/2018   Procedure: INSERTION PORT-A-CATH WITH ULTRASOUND;  Surgeon: Rolm Bookbinder, MD;  Location: [facility];  Service: General;  Laterality: N/A;  . RADIOACTIVE SEED GUIDED AXILLARY SENTINEL LYMPH NODE Left 03/27/2017   Procedure: LEFT NIPPLE SPARING MASTECTOMY WITH RADIOACTIVE SEED TARGETED LYMPH NODE EXCISION AND LEFT AXILLARY SENTINEL LYMPH NODE BIOPSY;  Surgeon: Rolm Bookbinder, MD;  Location: [facility];  Service: General;  Laterality: Left;  REQUESTS RNFA  . RECONSTRUCTION BREAST IMMEDIATE / DELAYED W/ TISSUE EXPANDER Bilateral 03/27/2017   BILATERAL BREAST RECONSTRUCTION WITH PLACEMENT OF TISSUE EXPANDER AND ALLODERM  . REMOVAL OF BILATERAL TISSUE EXPANDERS WITH PLACEMENT OF BILATERAL BREAST IMPLANTS Bilateral 01/05/2018   Procedure: REMOVAL OF BILATERAL TISSUE EXPANDERS WITH PLACEMENT OF BILATERAL BREAST IMPLANTS;  Surgeon: Irene Limbo, MD;  Location: [facility];  Service: Plastics;  Laterality: Bilateral;  . SEPTOPLASTY WITH ETHMOIDECTOMY, AND MAXILLARY ANTROSTOMY  10/29/2010   bilat. max. antrostomy with left max. stripping; left  ant. ethmoidectomy; right total ethmoidectomy; sphenoidotomy  . SHOULDER ARTHROSCOPY WITH SUBACROMIAL DECOMPRESSION AND BICEP TENDON REPAIR Right 07/12/2013   Procedure: RIGHT SHOULDER ARTHROSCOPY DEBRIDEMENT EXTENSIVE DECOMPRESSION SUBACROMIAL PARTIAL ACROMIOPLASTY;  Surgeon: Johnny Bridge, MD;  Location: [facility];  Service: Orthopedics;  Laterality: Right;  . WRIST ARTHROSCOPY  01/17/2012   Procedure: ARTHROSCOPY WRIST; right wrist Surgeon: Tennis Must, MD;  Location: [facility];  Service: Orthopedics;  Laterality: Right;  RIGHT WRIST ARTHROSCOPY WITH TRIANGULAR FIBROCARTILAGE COMPLEX REPAIR AND DEBRIDEMENT      OB History   No obstetric history on file.     Family History  Problem Relation Age of Onset  . Breast cancer Mother 67       triple negative  . Leukemia Father   . Lung cancer Father   . Heart attack Maternal Uncle   . Prostate cancer Paternal Uncle   . COPD Paternal Grandmother   . Heart disease Paternal Grandfather   . Prostate cancer Paternal Uncle   . Leukemia Cousin     Social History   Tobacco Use  . Smoking status: Never Smoker  . Smokeless tobacco: Never Used  Vaping Use  . Vaping Use: Never used  Substance Use Topics  . Alcohol use: Yes    Comment: Drinks very rare  . Drug use: No  Home Medications Prior to Admission medications   Medication Sig Start Date End Date Taking? Authorizing Provider  celecoxib (CELEBREX) 100 MG capsule Take 1 capsule (100 mg total) by mouth 2 (two) times daily. 12/06/19  Yes Lane Hacker L, DO  fluticasone (FLOVENT HFA) 110 MCG/ACT inhaler Inhale 2 puffs into the lungs in the morning and at bedtime. 02/14/20  Yes Tanner, Lyndon Code., PA-C  gabapentin (NEURONTIN) 300 MG capsule 2 capsules twice daily Patient taking differently: Take 300-600 mg by mouth 2 (two) times daily. Take 1 capsule (300 mg) in the morning & Take 2 capsules (600 mg) at bedtime 02/16/20  Yes Tanner, Lyndon Code., PA-C   HYDROmorphone (DILAUDID) 4 MG tablet Take 2 tablets (8 mg total) by mouth every 4 (four) hours as needed for up to 3 days for severe pain. 03/13/20 03/16/20 Yes Varney Biles, MD  lisdexamfetamine (VYVANSE) 40 MG capsule Take 1 capsule (40 mg total) by mouth every morning. 02/16/20  Yes Tanner, Lyndon Code., PA-C  LORazepam (ATIVAN) 0.5 MG tablet Take 1 tablet (0.5 mg total) by mouth every 6 (six) hours as needed for anxiety (nausea). 03/09/20  Yes Nicholas Lose, MD  methadone (DOLOPHINE) 10 MG tablet Take 1 tablet (10 mg total) by mouth every 8 (eight) hours. 03/05/20  Yes Causey, Charlestine Massed, NP  methylPREDNISolone (MEDROL DOSEPAK) 4 MG TBPK tablet Taper 6,5,4,3,2,1 Patient taking differently: Take 4-24 mg by mouth as directed. Take 6 tablets on Day 1 Take 5 tablets on Day 2 Take 4 tablets on Day 3 Take 3 tablets on Day 4 Take 2 tablets on Day 5 Take 1 tablet on Day 6 03/12/20  Yes Causey, Charlestine Massed, NP  venlafaxine XR (EFFEXOR-XR) 150 MG 24 hr capsule TAKE 1 CAPSULE (150 MG TOTAL) BY MOUTH DAILY WITH BREAKFAST. 11/28/19  Yes Nicholas Lose, MD  albuterol (VENTOLIN HFA) 108 (90 Base) MCG/ACT inhaler Inhale 1-2 puffs into the lungs every 6 (six) hours as needed for wheezing or shortness of breath. 02/14/20   Tanner, Lyndon Code., PA-C  dexamethasone (DECADRON) 2 MG tablet Take 2 mg by mouth 2 (two) times daily. 03/09/20   [provider]  lidocaine-prilocaine (EMLA) cream Apply 1 application topically daily as needed (access port). 09/29/19   [provider]  naloxone Karma Greaser) 4 MG/0.1ML LIQD nasal spray kit Spray once in one nostril prn overdose, may repeat x 1 01/24/19   Hayden Pedro, PA-C  pantoprazole (PROTONIX) 40 MG tablet TAKE 1 TABLET BY MOUTH TWICE A DAY Patient not taking: No sig reported 01/16/20   Nicholas Lose, MD  prochlorperazine (COMPAZINE) 10 MG tablet Take 1 tablet (10 mg total) by mouth every 6 (six) hours as needed (Nausea or vomiting). 04/01/19   Nicholas Lose, MD    Allergies    Denosumab, Statins, Sumatriptan, and Tape  Review of Systems   Review of Systems  Constitutional: Positive for activity change.  Respiratory: Negative for shortness of breath.   Cardiovascular: Negative for chest pain.  Genitourinary: Positive for pelvic pain.  Musculoskeletal: Positive for back pain.  Neurological: Negative for numbness.  All other systems reviewed and are negative.   Physical Exam Updated Vital Signs BP 108/75   Pulse 84   Temp 98.8 F (37.1 C) (Oral)   Resp 16   SpO2 96%   Physical Exam Vitals and nursing note reviewed.  Constitutional:      Appearance: She is well-developed.  HENT:     Head: Atraumatic.  Eyes:  Extraocular Movements: EOM normal.  Cardiovascular:     Rate and Rhythm: Normal rate.  Pulmonary:     Effort: Pulmonary effort is normal.  Musculoskeletal:        General: Tenderness present.     Cervical back: Neck supple.     Comments: Focal tenderness over the right pelvis superiorly, just lateral to the lumbosacral region.  Skin:    General: Skin is warm and dry.  Neurological:     Mental Status: She is alert and oriented to person, place, and time.     ED Results / Procedures / Treatments   Labs (all labs ordered are listed, but only abnormal results are displayed) Labs Reviewed - No data to display  EKG None  Radiology DG Lumbar Spine Complete  Result Date: 03/13/2020 CLINICAL DATA:  Metastatic breast cancer, low back pain into the hernia with slight EXAM: LUMBAR SPINE - COMPLETE 4+ VIEW; PELVIS - 1-2 VIEW COMPARISON:  MR metastatic screening 02/28/2020 CT abdomen and pelvis 10/22/2019 FINDINGS: Five lumbar levels. Mild levocurvature of the lumbar spine. No spondylolisthesis or discernible spondylolysis. Fever current with is a Redemonstration of the diffuse metastatic disease throughout the imaged portions of the thoracolumbar spine. This includes metastatic lesions visible in the T12, L2, L3  and L4 levels with some mild sclerosis which could reflect treatment related changes. Additional smaller metastatic foci and a right L3 pedicular lesion are better visualized on comparison MR screening. No discernible acute pathologic fracture or compression deformity is evident at this time. Bones of the pelvis appear intact and congruent. Incomplete fusion posterior arch S1 is similar to prior. Few subtle lucencies are seen in the left pubic body and left ilium. Some questionable sclerosis is noted in the right greater trochanter as well. Background arthrosis at the SI joints, symphysis pubis and bilateral hips. IMPRESSION: 1. Redemonstration of diffuse metastatic disease throughout the imaged portions of the thoracolumbar spine and pelvis, much better demonstrated on comparison cross-sectional imaging. 2. No acute pathologic fracture or compression deformity seen in the lumbar spine 3. No acute osseous injury of the bony pelvis. Electronically Signed   By: Lovena Le M.D.   On: 03/13/2020 16:38   DG Pelvis 1-2 Views  Result Date: 03/13/2020 CLINICAL DATA:  Metastatic breast cancer, low back pain into the hernia with slight EXAM: LUMBAR SPINE - COMPLETE 4+ VIEW; PELVIS - 1-2 VIEW COMPARISON:  MR metastatic screening 02/28/2020 CT abdomen and pelvis 10/22/2019 FINDINGS: Five lumbar levels. Mild levocurvature of the lumbar spine. No spondylolisthesis or discernible spondylolysis. Fever current with is a Redemonstration of the diffuse metastatic disease throughout the imaged portions of the thoracolumbar spine. This includes metastatic lesions visible in the T12, L2, L3 and L4 levels with some mild sclerosis which could reflect treatment related changes. Additional smaller metastatic foci and a right L3 pedicular lesion are better visualized on comparison MR screening. No discernible acute pathologic fracture or compression deformity is evident at this time. Bones of the pelvis appear intact and congruent.  Incomplete fusion posterior arch S1 is similar to prior. Few subtle lucencies are seen in the left pubic body and left ilium. Some questionable sclerosis is noted in the right greater trochanter as well. Background arthrosis at the SI joints, symphysis pubis and bilateral hips. IMPRESSION: 1. Redemonstration of diffuse metastatic disease throughout the imaged portions of the thoracolumbar spine and pelvis, much better demonstrated on comparison cross-sectional imaging. 2. No acute pathologic fracture or compression deformity seen in the lumbar spine 3. No  acute osseous injury of the bony pelvis. Electronically Signed   By: Lovena Le M.D.   On: 03/13/2020 16:38    Procedures .Critical Care Performed by: Varney Biles, MD Authorized by: Varney Biles, MD   Critical care provider statement:    Critical care time (minutes):  56   Critical care was necessary to treat or prevent imminent or life-threatening deterioration of the following conditions: Intractable pain, multiple parenteral pain dose along with reassessment.   Critical care was time spent personally by me on the following activities:  Discussions with consultants, evaluation of patient's response to treatment, examination of patient, ordering and performing treatments and interventions, ordering and review of laboratory studies, ordering and review of radiographic studies, pulse oximetry, re-evaluation of patient's condition, obtaining history from patient or surrogate and review of old charts     Medications Ordered in ED Medications  morphine (MSIR) tablet 30 mg (30 mg Oral Given 03/13/20 1945)  heparin lock flush 100 unit/mL (has no administration in time range)  HYDROmorphone (DILAUDID) injection 1 mg (has no administration in time range)  HYDROmorphone (DILAUDID) injection 1 mg (1 mg Intravenous Given 03/13/20 1454)  HYDROmorphone (DILAUDID) injection 1 mg (1 mg Intravenous Given 03/13/20 1629)  HYDROmorphone (DILAUDID) tablet 8  mg (8 mg Oral Given 03/13/20 2102)  HYDROmorphone (DILAUDID) tablet 8 mg (8 mg Oral Given 03/13/20 2333)    ED Course  I have reviewed the triage vital signs and the nursing notes.  Pertinent labs & imaging results that were available during my care of the patient were reviewed by me and considered in my medical decision making (see chart for details).  Clinical Course as of 03/13/20 2335  Fri Mar 13, 2020  1820 DG Lumbar Spine Complete X-rays show diffuse metastatic disease.  Patient has received 2 rounds of IV Dilaudid.  Trying to plan dispo. [AN]  2023 Results of the x-ray reviewed with the patient.  Pain control is the primary goal at this point.  Patient would like to go home if she can achieve this.  At her request, have called her palliative service.  We tried to coordinate IV pain medication, however that will not be possible over the weekend.  Also patient has to transition to hospice for the hospice team to be ordering IV Dilaudid.  I spoke with patient, at this time she is not comfortable truly committing to hospice philosophy as she still has not spoken with her family or physicians.  The only other option are going through home health with IV hydromorphone or pain medication ordered by her medical oncology or radiation oncology team.  That is something that we will not be able to coordinate on Friday evening.  Therefore, I have called Dr. Domingo Cocking, palliative service to see if he can give me aggressive oral pain control regimen for this patient.  She has received MSIR 30 mg at 745.  Dr. Domingo Cocking would likely want patient to get 8 mg oral Dilaudid.  At 845 she will be up for her first 8 mg oral Dilaudid dose, to be given only for pain is not at a tolerable level.  We will then reassess the patient by 10 PM to see if she is amenable to going home.  If the pain remains an issue then she will need admission for pain control. [AN]  2335 Patient informing that she felt a lot better after  receiving 8 mg oral Dilaudid.  She wants to go home.  She will receive 1 mg  IV Dilaudid before discharge.  She will return to the ER if her pain becomes unbearable. [AN]    Clinical Course User Index [AN] Varney Biles, MD   MDM Rules/Calculators/A&P                          45 year old female comes in a chief complaint of severe back pain.  She has known history of advanced metastatic breast cancer.  She has mets to the spine.  She is currently on methadone and Percocets, and her pain was manageable until 3 days ago.  No red flags suggesting spinal cord compression.  -We will give her 1 mg of IV Dilaudid.  I have reviewed the labs from her recent encounter, no need for additional labs at this time. -X-ray of her lumbar spine and pelvis ordered.  -I spoke with a team member at "[facility]".  Someone from their team will reach out to the patient on Monday for a close follow-up.  They have taken notes in case patient needs to reach out to them this weekend for further pain control.  If we are able to manage her pain, we will discharge her with MSIR.   Final Clinical Impression(s) / ED Diagnoses Final diagnoses:  Cancer related pain    Rx / DC Orders ED Discharge Orders         Ordered    HYDROmorphone (DILAUDID) 4 MG tablet  Every 4 hours PRN        03/13/20 2311           Varney Biles, MD 03/13/20 1627     Varney Biles, MD 03/13/20 2335

## 2020-03-15 ENCOUNTER — Other Ambulatory Visit: Payer: Self-pay | Admitting: Internal Medicine

## 2020-03-15 NOTE — Consult Note (Signed)
Palliative care consult note  Reason for consult: Pain management in light of metastatic breast cancer  Palliative care consult received from ED physician, Dr. Kathrynn Humble, for recommendations for pain management for metastatic breast cancer.  Chart reviewed including personal review of pertinent labs and imaging.  I know Laura Anthony from prior admission and familiar with her case.  She is also been seen recently as an outpatient by my partner, Laura Anthony.  Briefly, Laura Anthony is a 45 year old female with metastatic triple negative breast cancer with recent discovery of approximately 35 new small enhancing brain lesions for which she is currently undergoing radiation therapy.  She has a history of bony mets which have responded well to radiation in the past.  She is followed by outpatient palliative care ([facility] with [facility] as well as seeing Dr. [name]) in addition to oncology and they have been working collectively to determine best regimen for controlling her pain.  Currently, her regimen includes methadone 10 mg 3 times daily as well as breakthrough Percocet as needed.  She tells me that she developed worsening lower back pain that feels similar to her to the other times when she has had new bony lesions.  It is in her lower back, reaches 10 out of 10, sometimes radiates down to her but, is worse with any kind of movement, and is slightly improved with IV Dilaudid.  She reports that initially she was having some relief from Percocet for breakthrough pain, however, she feels the Percocet is no longer working.  We discussed options for care including potential for admission for pain management.  She is very adamant that she would like to go home if at all possible to spend time with her children.  In talking with her, she also has a trip at the end of the month to [place] and is desperate to be able to go on "this one last trip " with her family.  She leaves on the  28th of the month and is going to be going for 1 week.  Laura Anthony tells me that she is already been in discussion with Dr. [name] from radiation oncology as well as considering potential intervention by IR for this worsening pain.  In the past, she has responded tremendously well to radiation for any bony pain.  She reports her real need is to figure out if there is some sort of a regimen that will allow her to spend the rest of the weekend at home until she can follow-up on Monday with other members of her care team.  Recommendations: -Continue methadone on a scheduled basis for long-acting medication.  I have asked her to keep a record of how much short acting medication she uses as it is very likely we may need to increase her methadone.  At the same time, I would not bump it today as it would take a couple of days to his steady state as she may be getting interventional procedures which may reduce her pain.  If, however, she is not a candidate for interventional procedure/radiation or if her pain remains poorly controlled following these, I would certainly advocate for increasing her methadone (assuming QTc would allow). -Recommend we rotate from Percocet to Dilaudid for her rescue oral medication.  She reports she is getting fairly good response from IV Dilaudid and Percocet is no longer working.  She may just need to rotate short acting opioid.  She reports that 2 mg dose of Dilaudid seem  to be working most effectively.  We will therefore recommend trial of 8 mg of oral Dilaudid.  If this does relieve her pain sufficiently, she would like to work to get home this weekend and follow-up next week with her outpatient care team to discuss next steps.  If, however, her pain is not well controlled after trial of oral Dilaudid, I would recommend admission for pain management in which case I would start her on a PCA of Dilaudid overnight and plan to reassess in the morning. -She may be reaching a point where she  would benefit most from having a PCA as this appears to be largely incident pain related to movement and it is difficult to treat this with medications by mouth which would take up to 45 minutes to be effective.  She has already been discussion with outpatient providers through [facility] about potentially getting PCA when the time comes she enrolls in hospice.  At this time, however, she is still receiving radiation which would preclude admission to hospice at this point.  She tells me that she is considering timing for enrollment in hospice and this may likely be when she returns from her trip at the end of the month. -I certainly agree with evaluation to see if radiation oncology or IR have any interventions that may help control her pain.  She reports this does feel similar other pain that was very well mitigated by radiation. -She is already on steroids with repeated adjuvant as well. -I am on call this weekend and I provided her with a number for weekend calls.  I will plan to call and check in either tomorrow or Sunday to see how she is doing.  She knows to call in the interim if she has any issues.  Start time: 2015 End time: 2115 Total time: 60 minutes

## 2020-03-16 ENCOUNTER — Encounter: Payer: Self-pay | Admitting: *Deleted

## 2020-03-16 ENCOUNTER — Other Ambulatory Visit: Payer: Self-pay | Admitting: Radiation Oncology

## 2020-03-16 ENCOUNTER — Ambulatory Visit (HOSPITAL_COMMUNITY)
Admission: RE | Admit: 2020-03-16 | Discharge: 2020-03-16 | Disposition: A | Discharge status: Home / Self Care | Payer: 59 | Source: Ambulatory Visit | Attending: Radiation Oncology | Admitting: Radiation Oncology

## 2020-03-16 ENCOUNTER — Other Ambulatory Visit: Payer: Self-pay | Admitting: Hematology and Oncology

## 2020-03-16 ENCOUNTER — Telehealth: Payer: Self-pay | Admitting: *Deleted

## 2020-03-16 ENCOUNTER — Telehealth: Payer: Self-pay | Admitting: Radiation Oncology

## 2020-03-16 ENCOUNTER — Ambulatory Visit
Admission: RE | Admit: 2020-03-16 | Discharge: 2020-03-16 | Disposition: A | Discharge status: Home / Self Care | Payer: 59 | Source: Ambulatory Visit | Attending: Radiation Oncology | Admitting: Radiation Oncology

## 2020-03-16 ENCOUNTER — Other Ambulatory Visit: Payer: Self-pay

## 2020-03-16 VITALS — BP 121/81 | HR 104 | Temp 97.0°F | Resp 18 | Wt 140.2 lb

## 2020-03-16 DIAGNOSIS — C7951 Secondary malignant neoplasm of bone: Secondary | ICD-10-CM | POA: Insufficient documentation

## 2020-03-16 DIAGNOSIS — C50512 Malignant neoplasm of lower-outer quadrant of left female breast: Secondary | ICD-10-CM | POA: Diagnosis present

## 2020-03-16 DIAGNOSIS — C7931 Secondary malignant neoplasm of brain: Secondary | ICD-10-CM

## 2020-03-16 DIAGNOSIS — Z171 Estrogen receptor negative status [ER-]: Secondary | ICD-10-CM | POA: Diagnosis present

## 2020-03-16 MED ORDER — FENTANYL 50 MCG/HR TD PT72: 1.0000 | MEDICATED_PATCH | TRANSDERMAL | 0 refills | Status: DC

## 2020-03-16 MED ORDER — HYDROMORPHONE HCL 1 MG/ML IJ SOLN
2.0000 mg | Freq: Once | INTRAMUSCULAR | Status: AC
Start: 2020-03-16 — End: 2020-03-16
  Administered 2020-03-16: 2 mg via INTRAMUSCULAR
  Filled 2020-03-16: qty 2

## 2020-03-16 NOTE — Telephone Encounter (Signed)
Spoke to pt concerning goals of end of life. Pt's goal is to go on one last vacation with her family leaving on 2/28 to [place]. Discussed plan for initiating hospice care to help develop a plan for pain management. Dr Lindi Adie notified.

## 2020-03-16 NOTE — Telephone Encounter (Signed)
Over the weekend the patient notified me that she had gone to the emergency department because of excruciating pain in her back, she had imaging of the spine that showed widespread disease throughout the thoracolumbar spine and pelvis and these lesions include T12, L2-4, and she was offered admission but decided to try going home with some changes in her pain medication.  She did develop significant pain in the right upper arm and into the armpit after her ER visit.  We have touched base with Dr. [name] who is reviewing her situation to see if there is a role for any additional palliative radiotherapy.  I reached out to her palliative care nurse Jeanne Ivan at [facility] care connections program.  The medical director Dr. Regis Bill has authorized palliative radiotherapy treatments if necessary if she does transition to hospice based care.  They are willing to come out and start doing a Dilaudid PCA pump.  I called the patient and on the line Sigmund Hazel the breast cancer navigator who has worked very closely with the patient for many years, we discussed the goals that the patient has as she is hoping to travel to the [place] resort at [place] on 03/30/2020 for her last family trip.  This place has special meaning to her as well because this was the location of where she went with her mother prior to her passing as well.  Fortunately they are able to have a private jet flying them there.  After reviewing the goals of hospice based care, the patient is interested in making the transition to hospice but would also be interested in still pursuing palliative radiation if Dr. [name] feels that this is appropriate.  I have ordered plain film x-rays on her right humerus, and we will follow-up with these results this afternoon.  I have reserved time for her tomorrow for simulation if needed.  We discussed risks, benefits, short and long-term effects of radiotherapy given to the lumbar spine and right  upper extremity, and she is interested in proceeding.  We will also give 2 mg of IM Dilaudid this afternoon when she comes for treatment so she can more comfortably be on the table for her brain radiation which will finish on Wednesday of this week.  I called back to speak to her palliative care nurse and they will make the transition today and contact the patient about coming out to start a PCA pump.  Hopefully they can also attend her upcoming appointment with pain management who was going to consider a procedure for nerve block which may also be helpful.

## 2020-03-16 NOTE — Progress Notes (Signed)
Received patient in the radiation oncology nursing clinic prior to xrt. Vitals WDL. Patient denies pain in her body while sitting still. Patient reports pain 10/10 in her right arm and right hip with movement. The purpose of Dilaudid is to ease her pain and get her through treatment today.  Administered 1 mg/mL of Dilaudid in the right deltoid then the left for a total of 2 mg administered as directed by Shona Simpson, PA-C. Patient tolerated this well.

## 2020-03-17 ENCOUNTER — Ambulatory Visit
Admission: RE | Admit: 2020-03-17 | Discharge: 2020-03-17 | Disposition: A | Discharge status: Home / Self Care | Payer: 59 | Source: Ambulatory Visit | Attending: Radiation Oncology | Admitting: Radiation Oncology

## 2020-03-17 ENCOUNTER — Ambulatory Visit: Payer: 59

## 2020-03-17 DIAGNOSIS — C7931 Secondary malignant neoplasm of brain: Secondary | ICD-10-CM | POA: Diagnosis not present

## 2020-03-18 ENCOUNTER — Encounter: Payer: Self-pay | Admitting: *Deleted

## 2020-03-18 ENCOUNTER — Ambulatory Visit
Admission: RE | Admit: 2020-03-18 | Discharge: 2020-03-18 | Disposition: A | Discharge status: Home / Self Care | Payer: 59 | Source: Ambulatory Visit | Attending: Radiation Oncology | Admitting: Radiation Oncology

## 2020-03-18 ENCOUNTER — Encounter: Payer: Self-pay | Admitting: Radiation Oncology

## 2020-03-18 DIAGNOSIS — C7931 Secondary malignant neoplasm of brain: Secondary | ICD-10-CM | POA: Diagnosis not present

## 2020-03-19 ENCOUNTER — Other Ambulatory Visit: Payer: Self-pay | Admitting: Radiation Oncology

## 2020-03-19 ENCOUNTER — Ambulatory Visit
Admission: RE | Admit: 2020-03-19 | Discharge: 2020-03-19 | Disposition: A | Discharge status: Home / Self Care | Payer: 59 | Source: Ambulatory Visit | Attending: Radiation Oncology | Admitting: Radiation Oncology

## 2020-03-19 ENCOUNTER — Other Ambulatory Visit: Payer: Self-pay

## 2020-03-19 DIAGNOSIS — C7931 Secondary malignant neoplasm of brain: Secondary | ICD-10-CM | POA: Diagnosis not present

## 2020-03-19 NOTE — Progress Notes (Signed)
Patient Care Team: [name], MD as PCP - General (Family Medicine) Mauro Kaufmann, RN as Oncology Nurse Navigator Carlynn Spry, Charlott Holler, RN as Oncology Nurse Navigator [place], Aletha Halim, NP as Nurse Practitioner (Hospice and Palliative Medicine) Nicholas Lose, MD as Consulting Physician (Hematology and Oncology) Delice Bison, Charlestine Massed, NP as Nurse Practitioner (Hematology and Oncology) [place], [place] as Registered Nurse [facility] and Palliative Medicine)  DIAGNOSIS:    ICD-10-CM   1. Metastatic breast cancer ([place])  C50.919   2. Malignant neoplasm of lower-outer quadrant of left breast of female, estrogen receptor negative ([name])  C50.512    Z17.1     SUMMARY OF ONCOLOGIC HISTORY: 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, estrogen receptor negative ([place])  10/20/2016 Mammogram   Mammogram and ultrasound of the left breast revealed 1.7 cm mass at 4:00 position, 6:30 position 5 x 4 x 4 mm mass, 6:00 position 5 cm nipple 7 x 6 x 11 mm, left axillary lymph node with thickened cortex, T1c N1 stage II a AJCC 8   10/24/2016 Initial Diagnosis   Left breast biopsy 6:30 position 3 cm from nipple: IDC grade 2, DCIS, ER 0%, PR 0%, Ki-67 15% HER-2 positive ratio 2.1; 4:00 position 3 cm from nipple: IDC grade 2, DCIS, ER 0%, PR 0%, Ki-67 35%, HER-2 positive ratio 2.02; left axillary lymph node biopsy positive   11/04/2016 - 02/17/2017 Neo-Adjuvant Chemotherapy   TCH Perjeta 6 cycles followed by Herceptin + Perjeta maintenance to be completed September 2019   11/30/2016 Genetic Testing   Negative genetic testing on the common hereditary cancer panel.  The Hereditary Gene Panel offered by [facility] includes sequencing and/or deletion duplication testing of the following 47 genes: APC, ATM, AXIN2, BARD1, BMPR1A, BRCA1, BRCA2, BRIP1, CDH1, CDK4, CDKN2A (p14ARF), CDKN2A (p16INK4a), CHEK2, CTNNA1, DICER1, EPCAM (Deletion/duplication testing only), GREM1  (promoter region deletion/duplication testing only), KIT, MEN1, MLH1, MSH2, MSH3, MSH6, MUTYH, NBN, NF1, NHTL1, PALB2, PDGFRA, PMS2, POLD1, POLE, PTEN, RAD50, RAD51C, RAD51D, SDHB, SDHC, SDHD, SMAD4, SMARCA4. STK11, TP53, TSC1, TSC2, and VHL.  The following genes were evaluated for sequence changes only: SDHA and HOXB13 c.251G>A variant only. The report date is November 30, 2016.    03/27/2017 Surgery   Bilateral mastectomies: Left mastectomy: IDC grade 2 0.9 cm, nodes negative, right mastectomy benign, ER 0%, PR 0%, HER-2 positive ratio 2.6   05/08/2017 - 06/09/2017 Radiation Therapy   Adjuvant radiation therapy   10/23/2017 Miscellaneous   Neratinib discontinued after 4 weeks for severe diarrhea   07/25/2018 Relapse/Recurrence   MRI of right elbow showed bone lesion consistent with malignancy. PET scan showed bilateral pulmonary nodules and several lytic bone lesions compatible with metastatic disease. Brain MRI on 08/02/18 showed no evidence of metastatic disease.   08/02/2018 PET scan   Bilateral hypermetabolic lung nodules, LUL 1.3 cm with SUV 3.88, lingular nodule 1.4 cm SUV 3.7, central lingular nodule 1.2 cm SUV 9.76, right middle lobe nodule 1.5 cm SUV 9.9, lytic bone metastases inferior pubic ramus, sacrum, T12, right 11th rib.   08/08/2018 Procedure   Lung biopsy: metastatic carcinoma, HER-2 negative (0), ER/PR negative.   08/10/2018 -  Radiation Therapy   Palliative radiatio to the right humerus along the medial condyle   08/24/2018 - 09/05/2018 Radiation Therapy   Palliative radiation to the right 11th rib and right elbow   09/26/2018 - 12/04/2018 Chemotherapy   Carboplatin atezolizumab at [facility] with Dr. Janan Halter on  [facility] 043 clinical trial stopped because of new T5 metastases (toxicities included myopathy required prednisone, immune mediated thyroiditis), right upper extremity DVT on apixaban   12/14/2018 - 04/10/2019 Chemotherapy   [place]   12/17/2018 -  Radiation Therapy    Palliative radiation to sternal, sacral & pelvic lesions and SRS for T3 and C7-T1 lesions.   04/11/2019 -  Chemotherapy   Sacituzumab-Govitecan [name])   Bone metastases ([address] [address])  08/07/2018 Initial Diagnosis   Bone metastases ([place])   12/14/2018 - 03/28/2019 Chemotherapy   The patient had pertuzumab (PERJETA) 420 mg in sodium chloride 0.9 % 250 mL chemo infusion, 420 mg (100 % of original dose 420 mg), Intravenous, Once, 5 of 6 cycles Dose modification: 420 mg (original dose 420 mg, Cycle 1, Reason: Provider Judgment) Administration: 420 mg (12/14/2018), 420 mg (01/10/2019), 420 mg (03/28/2019), 420 mg (01/30/2019), 420 mg (03/01/2019) trastuzumab-dkst (OGIVRI) 600 mg in sodium chloride 0.9 % 250 mL chemo infusion, 609 mg, Intravenous,  Once, 5 of 6 cycles Administration: 600 mg (12/14/2018), 450 mg (01/10/2019), 450 mg (03/28/2019), 450 mg (01/30/2019), 450 mg (03/01/2019)  for chemotherapy treatment.    12/17/2018 -  Radiation Therapy   Palliative radiation to sternal, sacral & pelvic lesions and SRS for T3 and C7-T1 lesions.     CHIEF COMPLIANT: Follow-up of metastatic breast cancer  INTERVAL HISTORY: Laura Anthony is a 45 y.o. with above-mentioned history of metastatic breast cancer who has declined further oral chemotherapy treatment and completed whole brain radiation on 03/18/20. She was seen in the ED on 03/13/20 for a pain crisis. She presents to the clinic today for follow-up.  Currently on a pain pump her with Dilaudid and she appears to be doing much better.  They are also titrating her methadone.  She finished brain radiation yesterday and she is currently receiving radiation to the right arm.  She has complained of visual and auditory hallucinations.  ALLERGIES:  is allergic to denosumab, statins, sumatriptan, and tape.  MEDICATIONS:  Current Outpatient Medications  Medication Sig Dispense Refill  . albuterol (VENTOLIN HFA) 108 (90 Base) MCG/ACT inhaler Inhale 1-2 puffs  into the lungs every 6 (six) hours as needed for wheezing or shortness of breath. 18 g 6  . celecoxib (CELEBREX) 100 MG capsule Take 1 capsule (100 mg total) by mouth 2 (two) times daily. 60 capsule 3  . dexamethasone (DECADRON) 2 MG tablet Take 2 mg by mouth 2 (two) times daily.    . fentaNYL (DURAGESIC) 50 MCG/HR Place 1 patch onto the skin every 3 (three) days. 10 patch 0  . fluticasone (FLOVENT HFA) 110 MCG/ACT inhaler Inhale 2 puffs into the lungs in the morning and at bedtime. 1 each 12  . gabapentin (NEURONTIN) 300 MG capsule 2 capsules twice daily (Patient taking differently: Take 300-600 mg by mouth 2 (two) times daily. Take 1 capsule (300 mg) in the morning & Take 2 capsules (600 mg) at bedtime) 120 capsule 5  . lidocaine-prilocaine (EMLA) cream Apply 1 application topically daily as needed (access port).    . lisdexamfetamine (VYVANSE) 40 MG capsule Take 1 capsule (40 mg total) by mouth every morning. 30 capsule 0  . LORazepam (ATIVAN) 0.5 MG tablet Take 1 tablet (0.5 mg total) by mouth every 6 (six) hours as needed for anxiety (nausea). 45 tablet 1  . methadone (DOLOPHINE) 10 MG tablet Take 1 tablet (10 mg total) by mouth every 8 (eight) hours. 90 tablet 0  . methylPREDNISolone (MEDROL DOSEPAK) 4 MG TBPK tablet  Taper 6,5,4,3,2,1 (Patient taking differently: Take 4-24 mg by mouth as directed. Take 6 tablets on Day 1 Take 5 tablets on Day 2 Take 4 tablets on Day 3 Take 3 tablets on Day 4 Take 2 tablets on Day 5 Take 1 tablet on Day 6) 21 tablet 1  . naloxone (NARCAN) 4 MG/0.1ML LIQD nasal spray kit Spray once in one nostril prn overdose, may repeat x 1 1 each 1  . pantoprazole (PROTONIX) 40 MG tablet TAKE 1 TABLET BY MOUTH TWICE A DAY (Patient not taking: No sig reported) 180 tablet 1  . prochlorperazine (COMPAZINE) 10 MG tablet Take 1 tablet (10 mg total) by mouth every 6 (six) hours as needed (Nausea or vomiting). 30 tablet 1  . venlafaxine XR (EFFEXOR-XR) 150 MG 24 hr capsule TAKE 1  CAPSULE (150 MG TOTAL) BY MOUTH DAILY WITH BREAKFAST. 90 capsule 3   No current facility-administered medications for this visit.   Facility-Administered Medications Ordered in Other Visits  Medication Dose Route Frequency Provider Last Rate Last Admin  . sodium chloride flush (NS) 0.9 % injection 10 mL  10 mL Intracatheter PRN Nicholas Lose, MD        PHYSICAL EXAMINATION: ECOG PERFORMANCE STATUS: 2 - Symptomatic, <50% confined to bed  Vitals:   03/20/20 1225  BP: 108/67  Pulse: (!) 108  Resp: 20  Temp: 98.4 F (36.9 C)  SpO2: 98%   Filed Weights   03/20/20 1225  Weight: 142 lb 14.4 oz (64.8 kg)    LABORATORY DATA:  I have reviewed the data as listed CMP Latest Ref Rng & Units 02/28/2020 02/18/2020 02/14/2020  Glucose 70 - 99 mg/dL 127(H) 107(H) 101(H)  BUN 6 - 20 mg/dL 15 21(H) 20  Creatinine 0.44 - 1.00 mg/dL 0.83 0.83 0.82  Sodium 135 - 145 mmol/L 139 137 140  Potassium 3.5 - 5.1 mmol/L 3.9 3.8 4.4  Chloride 98 - 111 mmol/L 99 98 101  CO2 22 - 32 mmol/L _0 Calcium 8.9 - 10.3 mg/dL 10.3 9.9 10.4(H)  Total Protein 6.5 - 8.1 g/dL 7.6 7.5 8.0  Total Bilirubin 0.3 - 1.2 mg/dL 0.4 0.7 0.6  Alkaline Phos 38 - 126 U/L 278(H) 298(H) 420(H)  AST 15 - 41 U/L 30 46(H) 79(H)  ALT 0 - 44 U/L 21 42 68(H)    Lab Results  Component Value Date   WBC 3.9 (L) 02/28/2020   HGB 7.7 (L) 02/28/2020   HCT 23.9 (L) 02/28/2020   MCV 88.8 02/28/2020   PLT 66 (L) 02/28/2020   NEUTROABS 2.4 02/28/2020    ASSESSMENT & PLAN:  Malignant neoplasm of lower-outer quadrant of left breast of female, estrogen receptor negative ([place]) 10/24/2016: Left breast biopsy 6:30 position 3 cm from nipple: IDC grade 2, DCIS, ER 0%, PR 0%, Ki-67 15% HER-2 positive ratio 2.1; 4:00 position 3 cm from nipple: IDC grade 2, DCIS, ER 0%, PR 0%, Ki-67 35%, HER-2 positive ratio 2.02 Lymph node biopsy positive  Treatment Summary: 1. Neoadjuvant chemotherapy with TCHPcompleted 02/17/2017 this would be  followed by Herceptinand Perjetamaintenance for 1 yearcompleted September 2019 2.Bilateral mastectomies 03/28/2016:Bilateral mastectomies: Left mastectomy: IDC grade 2 0.9 cm, nodes negative, right mastectomy benign, ER 0%, PR 0%, HER-2 positive ratio 2.6 3.Adjuvant radiation4/08/2017 to 06/09/2017 4.Neratinib started 10/12/2017 discontinued due to diarrhea 5. Elbow fracture: Due to metastatic disease, palliative radiation therapy 6. Carboplatin atezolizumab at [facility] on a clinical trial [facility] 043 stopped for progression 12/04/2018 7.[name] discontinued because of progression  8.Xeloda started 10/1/2021discontinued 12/19/2019 for progression 9.  Talazoparib started January 2022-discontinued February 2022 10.  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  Improvement of pre-existing pontine, right cerebellar and right temporal lesions.  Current pain regimen: Dilaudid PCA pump Patient is now enrolled in hospice. It was a very emotional time saying goodbye to the patient. She is going to [place] for the last and final vacation on February 28 for a week.  I recommended that she take the Dilaudid PCA pump along with her.  No orders of the defined types were placed in this encounter.  The patient has a good understanding of the overall plan. she agrees with it. she will call with any problems that may develop before the next visit here.  Total time spent: 30 mins including face to face time and time spent for planning, charting and coordination of care  Rulon Eisenmenger, MD, MPH 03/20/2020  I, Molly Dorshimer, am acting as scribe for Dr.  Nicholas Lose.  I have reviewed the above documentation for accuracy and completeness, and I agree with the above.

## 2020-03-19 NOTE — Progress Notes (Signed)
  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 She is going to proceed with palliative radiation as well to her back and right humerus as well which will begin this week.  Plan: The patient will be contacted in the coming weeks by radiation oncology department. She has also enrolled in hospice care and will be seen as needed by Dr. Lindi Adie as well.   ________________________________________________    Carola Rhine, PAC

## 2020-03-20 ENCOUNTER — Encounter: Payer: Self-pay | Admitting: *Deleted

## 2020-03-20 ENCOUNTER — Ambulatory Visit
Admission: RE | Admit: 2020-03-20 | Discharge: 2020-03-20 | Disposition: A | Discharge status: Home / Self Care | Payer: 59 | Source: Ambulatory Visit | Attending: Radiation Oncology | Admitting: Radiation Oncology

## 2020-03-20 ENCOUNTER — Inpatient Hospital Stay (HOSPITAL_BASED_OUTPATIENT_CLINIC_OR_DEPARTMENT_OTHER): Payer: 59 | Admitting: Hematology and Oncology

## 2020-03-20 ENCOUNTER — Other Ambulatory Visit: Payer: Self-pay

## 2020-03-20 VITALS — BP 108/67 | HR 108 | Temp 98.4°F | Resp 20 | Ht 67.0 in | Wt 142.9 lb

## 2020-03-20 DIAGNOSIS — C7931 Secondary malignant neoplasm of brain: Secondary | ICD-10-CM | POA: Diagnosis not present

## 2020-03-20 DIAGNOSIS — C50919 Malignant neoplasm of unspecified site of unspecified female breast: Secondary | ICD-10-CM | POA: Diagnosis not present

## 2020-03-20 DIAGNOSIS — C50512 Malignant neoplasm of lower-outer quadrant of left female breast: Secondary | ICD-10-CM | POA: Diagnosis not present

## 2020-03-20 DIAGNOSIS — Z171 Estrogen receptor negative status [ER-]: Secondary | ICD-10-CM | POA: Diagnosis not present

## 2020-03-20 NOTE — Assessment & Plan Note (Signed)
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 Neoadjuvant chemotherapy with TCHPcompleted 02/17/2017 this would be followed by Herceptinand Perjetamaintenance for 1 yearcompleted September 2019 2.Bilateral mastectomies 03/28/2016:Bilateral mastectomies: Left mastectomy: IDC grade 2 0.9 cm, nodes negative, right mastectomy benign, ER 0%, PR 0%, HER-2 positive ratio 2.6 3.Adjuvant radiation4/08/2017 to 06/09/2017 4.Neratinib started 10/12/2017 discontinued due to diarrhea 5. Elbow fracture: Due to metastatic disease, palliative radiation therapy 6. Carboplatin atezolizumab at [facility] on a clinical trial [facility] 043 stopped for progression 12/04/2018 7.[name] discontinued because of progression 8.Xeloda started 10/1/2021discontinued 12/19/2019 for progression 9.  Talazoparib started January 2022-discontinued February 2022 10.  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  Improvement of pre-existing pontine, right cerebellar and right temporal lesions.  Current pain regimen: Dilaudid PCA pump

## 2020-03-23 ENCOUNTER — Ambulatory Visit
Admission: RE | Admit: 2020-03-23 | Discharge: 2020-03-23 | Disposition: A | Discharge status: Home / Self Care | Payer: 59 | Source: Ambulatory Visit | Attending: Radiation Oncology | Admitting: Radiation Oncology

## 2020-03-23 ENCOUNTER — Other Ambulatory Visit: Payer: Self-pay

## 2020-03-23 DIAGNOSIS — C7931 Secondary malignant neoplasm of brain: Secondary | ICD-10-CM | POA: Diagnosis not present

## 2020-03-24 ENCOUNTER — Other Ambulatory Visit: Payer: Self-pay

## 2020-03-24 ENCOUNTER — Ambulatory Visit
Admission: RE | Admit: 2020-03-24 | Discharge: 2020-03-24 | Disposition: A | Discharge status: Home / Self Care | Payer: 59 | Source: Ambulatory Visit | Attending: Radiation Oncology | Admitting: Radiation Oncology

## 2020-03-24 DIAGNOSIS — C7931 Secondary malignant neoplasm of brain: Secondary | ICD-10-CM | POA: Diagnosis not present

## 2020-03-25 ENCOUNTER — Ambulatory Visit
Admission: RE | Admit: 2020-03-25 | Discharge: 2020-03-25 | Disposition: A | Discharge status: Home / Self Care | Payer: 59 | Source: Ambulatory Visit | Attending: Radiation Oncology | Admitting: Radiation Oncology

## 2020-03-25 ENCOUNTER — Encounter: Payer: Self-pay | Admitting: Radiation Oncology

## 2020-03-25 ENCOUNTER — Other Ambulatory Visit: Payer: Self-pay

## 2020-03-25 DIAGNOSIS — C7931 Secondary malignant neoplasm of brain: Secondary | ICD-10-CM | POA: Diagnosis not present

## 2020-03-29 ENCOUNTER — Other Ambulatory Visit: Payer: Self-pay | Admitting: Internal Medicine

## 2020-03-30 NOTE — Progress Notes (Signed)
  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  Intent: Palliative  Radiation Treatment Dates: 03/19/2020 through 03/25/2020 Site Technique Total Dose (Gy) Dose per Fx (Gy) Completed Fx Beam Energies         Arm, Right: Ext_Rt_Rt shou Complex 20/20 4 5/5 6X, 10X  Pelvis: Pelvis_Rt uppe Complex 20/20 4 5/5 6X, 10X  Pelvis: Pelvis_rt lowe Complex 20/20 4 5/5 10X   Narrative: The patient tolerated radiation therapy relatively well with mild improvement in pain.  Plan: The patient will follow-up as needed with radiation and medical oncology as she is now under hospice care. ________________________________________________    Carola Rhine, PAC

## 2020-04-15 ENCOUNTER — Emergency Department (HOSPITAL_COMMUNITY): Payer: 59

## 2020-04-15 ENCOUNTER — Other Ambulatory Visit: Payer: Self-pay

## 2020-04-15 ENCOUNTER — Inpatient Hospital Stay (HOSPITAL_COMMUNITY)
Admission: EM | Admit: 2020-04-15 | Discharge: 2020-04-17 | DRG: 543 | Disposition: A | Discharge status: Home / Self Care | Payer: 59 | Attending: Internal Medicine | Admitting: Internal Medicine

## 2020-04-15 ENCOUNTER — Encounter (HOSPITAL_COMMUNITY): Payer: Self-pay | Admitting: Emergency Medicine

## 2020-04-15 DIAGNOSIS — K219 Gastro-esophageal reflux disease without esophagitis: Secondary | ICD-10-CM | POA: Diagnosis present

## 2020-04-15 DIAGNOSIS — C7800 Secondary malignant neoplasm of unspecified lung: Secondary | ICD-10-CM | POA: Diagnosis present

## 2020-04-15 DIAGNOSIS — Z888 Allergy status to other drugs, medicaments and biological substances status: Secondary | ICD-10-CM

## 2020-04-15 DIAGNOSIS — C7951 Secondary malignant neoplasm of bone: Secondary | ICD-10-CM | POA: Diagnosis present

## 2020-04-15 DIAGNOSIS — C7972 Secondary malignant neoplasm of left adrenal gland: Secondary | ICD-10-CM | POA: Diagnosis present

## 2020-04-15 DIAGNOSIS — Z515 Encounter for palliative care: Secondary | ICD-10-CM

## 2020-04-15 DIAGNOSIS — R1011 Right upper quadrant pain: Secondary | ICD-10-CM

## 2020-04-15 DIAGNOSIS — Z79891 Long term (current) use of opiate analgesic: Secondary | ICD-10-CM

## 2020-04-15 DIAGNOSIS — D696 Thrombocytopenia, unspecified: Secondary | ICD-10-CM | POA: Diagnosis present

## 2020-04-15 DIAGNOSIS — Z66 Do not resuscitate: Secondary | ICD-10-CM | POA: Diagnosis present

## 2020-04-15 DIAGNOSIS — Z20822 Contact with and (suspected) exposure to covid-19: Secondary | ICD-10-CM | POA: Diagnosis present

## 2020-04-15 DIAGNOSIS — Z91048 Other nonmedicinal substance allergy status: Secondary | ICD-10-CM

## 2020-04-15 DIAGNOSIS — G893 Neoplasm related pain (acute) (chronic): Secondary | ICD-10-CM | POA: Diagnosis present

## 2020-04-15 DIAGNOSIS — E7800 Pure hypercholesterolemia, unspecified: Secondary | ICD-10-CM | POA: Diagnosis present

## 2020-04-15 DIAGNOSIS — M4854XA Collapsed vertebra, not elsewhere classified, thoracic region, initial encounter for fracture: Principal | ICD-10-CM | POA: Diagnosis present

## 2020-04-15 DIAGNOSIS — Z825 Family history of asthma and other chronic lower respiratory diseases: Secondary | ICD-10-CM

## 2020-04-15 DIAGNOSIS — Z801 Family history of malignant neoplasm of trachea, bronchus and lung: Secondary | ICD-10-CM

## 2020-04-15 DIAGNOSIS — M549 Dorsalgia, unspecified: Secondary | ICD-10-CM | POA: Diagnosis not present

## 2020-04-15 DIAGNOSIS — C787 Secondary malignant neoplasm of liver and intrahepatic bile duct: Secondary | ICD-10-CM | POA: Diagnosis present

## 2020-04-15 DIAGNOSIS — Z171 Estrogen receptor negative status [ER-]: Secondary | ICD-10-CM

## 2020-04-15 DIAGNOSIS — Z7189 Other specified counseling: Secondary | ICD-10-CM

## 2020-04-15 DIAGNOSIS — Z803 Family history of malignant neoplasm of breast: Secondary | ICD-10-CM

## 2020-04-15 DIAGNOSIS — C7931 Secondary malignant neoplasm of brain: Secondary | ICD-10-CM | POA: Diagnosis present

## 2020-04-15 DIAGNOSIS — C50512 Malignant neoplasm of lower-outer quadrant of left female breast: Secondary | ICD-10-CM | POA: Diagnosis present

## 2020-04-15 DIAGNOSIS — F419 Anxiety disorder, unspecified: Secondary | ICD-10-CM | POA: Diagnosis present

## 2020-04-15 DIAGNOSIS — Z8249 Family history of ischemic heart disease and other diseases of the circulatory system: Secondary | ICD-10-CM

## 2020-04-15 DIAGNOSIS — Z9013 Acquired absence of bilateral breasts and nipples: Secondary | ICD-10-CM

## 2020-04-15 DIAGNOSIS — Z79899 Other long term (current) drug therapy: Secondary | ICD-10-CM

## 2020-04-15 DIAGNOSIS — D630 Anemia in neoplastic disease: Secondary | ICD-10-CM | POA: Diagnosis present

## 2020-04-15 DIAGNOSIS — Z86718 Personal history of other venous thrombosis and embolism: Secondary | ICD-10-CM

## 2020-04-15 LAB — CBC WITH DIFFERENTIAL/PLATELET
Abs Immature Granulocytes: 0.12 10*3/uL — ABNORMAL HIGH (ref 0.00–0.07)
Basophils Absolute: 0 10*3/uL (ref 0.0–0.1)
Basophils Relative: 0 %
Eosinophils Absolute: 0 10*3/uL (ref 0.0–0.5)
Eosinophils Relative: 0 %
HCT: 27.4 % — ABNORMAL LOW (ref 36.0–46.0)
Hemoglobin: 8.4 g/dL — ABNORMAL LOW (ref 12.0–15.0)
Immature Granulocytes: 1 %
Lymphocytes Relative: 8 %
Lymphs Abs: 0.7 10*3/uL (ref 0.7–4.0)
MCH: 28 pg (ref 26.0–34.0)
MCHC: 30.7 g/dL (ref 30.0–36.0)
MCV: 91.3 fL (ref 80.0–100.0)
Monocytes Absolute: 0.7 10*3/uL (ref 0.1–1.0)
Monocytes Relative: 8 %
Neutro Abs: 7.5 10*3/uL (ref 1.7–7.7)
Neutrophils Relative %: 83 %
Platelets: 109 10*3/uL — ABNORMAL LOW (ref 150–400)
RBC: 3 MIL/uL — ABNORMAL LOW (ref 3.87–5.11)
RDW: 21.9 % — ABNORMAL HIGH (ref 11.5–15.5)
WBC: 9.1 10*3/uL (ref 4.0–10.5)
nRBC: 0.3 % — ABNORMAL HIGH (ref 0.0–0.2)

## 2020-04-15 LAB — COMPREHENSIVE METABOLIC PANEL
ALT: 117 U/L — ABNORMAL HIGH (ref 0–44)
AST: 91 U/L — ABNORMAL HIGH (ref 15–41)
Albumin: 2.9 g/dL — ABNORMAL LOW (ref 3.5–5.0)
Alkaline Phosphatase: 189 U/L — ABNORMAL HIGH (ref 38–126)
Anion gap: 13 (ref 5–15)
BUN: 20 mg/dL (ref 6–20)
CO2: 25 mmol/L (ref 22–32)
Calcium: 9 mg/dL (ref 8.9–10.3)
Chloride: 99 mmol/L (ref 98–111)
Creatinine, Ser: 0.67 mg/dL (ref 0.44–1.00)
GFR, Estimated: >60 mL/min (ref >60)
Glucose, Bld: 106 mg/dL — ABNORMAL HIGH (ref 70–99)
Potassium: 3.8 mmol/L (ref 3.5–5.1)
Sodium: 137 mmol/L (ref 135–145)
Total Bilirubin: 0.5 mg/dL (ref 0.3–1.2)
Total Protein: 7.5 g/dL (ref 6.5–8.1)

## 2020-04-15 LAB — LIPASE, BLOOD: Lipase: 22 U/L (ref 11–51)

## 2020-04-15 LAB — RESP PANEL BY RT-PCR (FLU A&B, COVID) ARPGX2
Influenza A by PCR: NEGATIVE
Influenza B by PCR: NEGATIVE
SARS Coronavirus 2 by RT PCR: NEGATIVE

## 2020-04-15 MED ORDER — SODIUM CHLORIDE 0.9% FLUSH: 9.0000 mL | INTRAVENOUS | Status: DC | PRN

## 2020-04-15 MED ORDER — MORPHINE SULFATE (PF) 4 MG/ML IV SOLN
4.0000 mg | Freq: Once | INTRAVENOUS | Status: AC
  Administered 2020-04-15: 4 mg via INTRAVENOUS
  Filled 2020-04-15: qty 1

## 2020-04-15 MED ORDER — HYDROMORPHONE HCL 2 MG/ML IJ SOLN
2.0000 mg | Freq: Once | INTRAMUSCULAR | Status: AC
Start: 2020-04-15 — End: 2020-04-15
  Administered 2020-04-15: 2 mg via INTRAVENOUS
  Filled 2020-04-15: qty 1

## 2020-04-15 MED ORDER — HYDROMORPHONE HCL 1 MG/ML IJ SOLN
1.0000 mg | Freq: Once | INTRAMUSCULAR | Status: AC
  Administered 2020-04-15: 1 mg via INTRAVENOUS
  Filled 2020-04-15: qty 1

## 2020-04-15 MED ORDER — DIPHENHYDRAMINE HCL 12.5 MG/5ML PO ELIX: 12.5000 mg | ORAL_SOLUTION | Freq: Four times a day (QID) | ORAL | Status: DC | PRN

## 2020-04-15 MED ORDER — HYDROMORPHONE 1 MG/ML IV SOLN: INTRAVENOUS | Status: DC

## 2020-04-15 MED ORDER — DIPHENHYDRAMINE HCL 50 MG/ML IJ SOLN: 12.5000 mg | Freq: Four times a day (QID) | INTRAMUSCULAR | Status: DC | PRN

## 2020-04-15 MED ORDER — IOHEXOL 300 MG/ML  SOLN
100.0000 mL | Freq: Once | INTRAMUSCULAR | Status: AC | PRN
  Administered 2020-04-15: 100 mL via INTRAVENOUS

## 2020-04-15 MED ORDER — NALOXONE HCL 0.4 MG/ML IJ SOLN
0.4000 mg | INTRAMUSCULAR | Status: DC | PRN
Start: 2020-04-15 — End: 2020-04-16

## 2020-04-15 MED ORDER — ONDANSETRON HCL 4 MG/2ML IJ SOLN: 4.0000 mg | Freq: Four times a day (QID) | INTRAMUSCULAR | Status: DC | PRN

## 2020-04-15 MED ORDER — HYDROMORPHONE HCL 2 MG/ML IJ SOLN
3.0000 mg | Freq: Once | INTRAMUSCULAR | Status: AC
  Administered 2020-04-16: 3 mg via INTRAVENOUS
  Filled 2020-04-15: qty 2

## 2020-04-15 NOTE — ED Provider Notes (Addendum)
[facility] DEPT Provider Note   CSN: [id number] Arrival date & time: 04/15/20  1709     History Chief Complaint  Patient presents with  . Abdominal Pain    Laura Anthony is a 45 y.o. female.  HPI 45 year old female with a history of stage IV breast cancer with known mets to the bone, lungs, anemia, anxiety, migraines presents to the ER with complaints of right upper back pain which radiates into the abdomen which began several days ago.  She has a outpatient abdominal ultrasound scheduled for tomorrow, however her pain has been so uncontrolled, hospice RN has advised the patient to come to the ER.  She denies any nausea or vomiting, no known fevers or chills.  No dysuria or hematuria.  No chest pain or shortness of breath.  Patient did arrive in distress from pain, moaning, crying.  Patient had just returned from vacation at [place].  Developed pain while she was there, was advised to come to the ER.  She was on a PCA pump, but was weaned off of this prior to her trip.    Past Medical History:  Diagnosis Date  . ADD (attention deficit disorder)   . Anemia   . Anxiety   . Breast cancer, left breast ([place])    S/P mastectomy 03/27/2017  . DVT (deep venous thrombosis) ([place]) 2017   calf left - probably due to [facility] pills-took eliquis x3 mos, nonthing now  . High cholesterol   . Impingement syndrome of right shoulder 07/2013  . Migraine    "usually 1/month" (03/28/2017)  . PONV (postoperative nausea and vomiting)   . Right bicipital tenosynovitis 07/2013  . Rotator cuff impingement syndrome of right shoulder 07/12/2013  . Seizures ([place])    x 1 as a child - was never on anticonvulsants (03/28/2017)    Patient Active Problem List   Diagnosis Date Noted  . Cancer-related breakthrough pain 04/15/2020  . Brain metastases ([place]) 02/29/2020  . Pneumonia due to COVID-19 virus 07/22/2019  . 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 Palliative  care patient 01/06/2019  . Inadequate pain control 01/02/2019  . Acute embolism and thrombosis of unspecified deep veins of unspecified lower extremity ([place]) 12/26/2018  . Neutropenic fever ([place]) 12/26/2018  . Sorethroat 12/26/2018  . 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 Complete lesion at C3 level of cervical spinal cord ([place]) 12/10/2018  . Neck pain 12/09/2018  . Hypothyroidism 12/09/2018  . Goals of care, counseling/discussion 12/06/2018  . Metastatic breast cancer ([place]) 08/08/2018  . Bone metastases ([place]) 08/07/2018  . Pain from bone metastases ([place]) 08/07/2018  . Family history of breast cancer 04/02/2018  . Family history of prostate cancer 04/02/2018  . History of therapeutic radiation 10/16/2017  . Acquired absence of both breasts 04/03/2017  . Breast cancer, stage 2, left ([place]) 03/27/2017  . Port-A-Cath in place 12/16/2016  . Genetic testing 12/06/2016  . Encounter for antineoplastic chemotherapy 11/25/2016  . Malignant neoplasm of lower-outer quadrant of left breast of female, estrogen receptor negative ([place]) 10/27/2016  . Breast lump on left side at 1 o'clock position 05/22/2015  . Migraine without aura or status migrainosus 05/29/2014  . Hyperlipidemia LDL goal <100 10/22/2013  . Depression 09/09/2013  . Rotator cuff impingement syndrome of left shoulder 07/12/2013  . ADD (attention deficit disorder) 01/09/2013  . Anxiety 12/13/2012  . Insomnia 12/13/2012    Past Surgical History:  Procedure Laterality Date  . ADENOIDECTOMY  1981  .  ANKLE ARTHROSCOPY Right   . BREAST BIOPSY Left 10/2016  . KNEE ARTHROSCOPY Right   . KNEE ARTHROSCOPY W/ ACL RECONSTRUCTION Left   . LIPOSUCTION WITH LIPOFILLING Bilateral 01/05/2018   Procedure: LIPOFILLING FROM ABDOMEN TO BILATERAL CHEST;  Surgeon: Irene Limbo, MD;  Location: [facility];  Service: Plastics;  Laterality: Bilateral;  . MASTECTOMY Left 03/27/2017   NIPPLE SPARING MASTECTOMY WITH  RADIOACTIVE SEED TARGETED LYMPH NODE EXCISION AND LEFT AXILLARY SENTINEL LYMPH NODE BIOPSY  . MASTECTOMY Right 03/27/2017   RIGHT PROPHYLACTIC NIPPLE SPARING MASTECTOMY  . NIPPLE SPARING MASTECTOMY Right 03/27/2017   Procedure: RIGHT PROPHYLACTIC NIPPLE SPARING MASTECTOMY;  Surgeon: Rolm Bookbinder, MD;  Location: [facility];  Service: General;  Laterality: Right;  . PORT-A-CATH REMOVAL Right 01/05/2018   Procedure: REMOVAL RIGHT CHEST PORT;  Surgeon: Irene Limbo, MD;  Location: [facility];  Service: Plastics;  Laterality: Right;  . PORTACATH PLACEMENT N/A 11/01/2016   Procedure: INSERTION PORT-A-CATH WITH [place];  Surgeon: Rolm Bookbinder, MD;  Location: [facility];  Service: General;  Laterality: N/A;  . PORTACATH PLACEMENT N/A 08/23/2018   Procedure: INSERTION PORT-A-CATH WITH ULTRASOUND;  Surgeon: Rolm Bookbinder, MD;  Location: [facility];  Service: General;  Laterality: N/A;  . RADIOACTIVE SEED GUIDED AXILLARY SENTINEL LYMPH NODE Left 03/27/2017   Procedure: LEFT NIPPLE SPARING MASTECTOMY WITH RADIOACTIVE SEED TARGETED LYMPH NODE EXCISION AND LEFT AXILLARY SENTINEL LYMPH NODE BIOPSY;  Surgeon: Rolm Bookbinder, MD;  Location: [facility];  Service: General;  Laterality: Left;  REQUESTS RNFA  . RECONSTRUCTION BREAST IMMEDIATE / DELAYED W/ TISSUE EXPANDER Bilateral 03/27/2017   BILATERAL BREAST RECONSTRUCTION WITH PLACEMENT OF TISSUE EXPANDER AND ALLODERM  . REMOVAL OF BILATERAL TISSUE EXPANDERS WITH PLACEMENT OF BILATERAL BREAST IMPLANTS Bilateral 01/05/2018   Procedure: REMOVAL OF BILATERAL TISSUE EXPANDERS WITH PLACEMENT OF BILATERAL BREAST IMPLANTS;  Surgeon: Irene Limbo, MD;  Location: [facility];  Service: Plastics;  Laterality: Bilateral;  . SEPTOPLASTY WITH ETHMOIDECTOMY, AND MAXILLARY ANTROSTOMY  10/29/2010   bilat. max. antrostomy with left max. stripping; left ant. ethmoidectomy; right total  ethmoidectomy; sphenoidotomy  . SHOULDER ARTHROSCOPY WITH SUBACROMIAL DECOMPRESSION AND BICEP TENDON REPAIR Right 07/12/2013   Procedure: RIGHT SHOULDER ARTHROSCOPY DEBRIDEMENT EXTENSIVE DECOMPRESSION SUBACROMIAL PARTIAL ACROMIOPLASTY;  Surgeon: Johnny Bridge, MD;  Location: [facility];  Service: Orthopedics;  Laterality: Right;  . WRIST ARTHROSCOPY  01/17/2012   Procedure: ARTHROSCOPY WRIST; right wrist Surgeon: Tennis Must, MD;  Location: [facility];  Service: Orthopedics;  Laterality: Right;  RIGHT WRIST ARTHROSCOPY WITH TRIANGULAR FIBROCARTILAGE COMPLEX REPAIR AND DEBRIDEMENT      OB History   No obstetric history on file.     Family History  Problem Relation Age of Onset  . Breast cancer Mother 47       triple negative  . Leukemia Father   . Lung cancer Father   . Heart attack Maternal Uncle   . Prostate cancer Paternal Uncle   . COPD Paternal Grandmother   . Heart disease Paternal Grandfather   . Prostate cancer Paternal Uncle   . Leukemia Cousin     Social History   Tobacco Use  . Smoking status: Never Smoker  . Smokeless tobacco: Never Used  Vaping Use  . Vaping Use: Never used  Substance Use Topics  . Alcohol use: Yes    Comment: Drinks very rare  . Drug use: No    Home Medications Prior to Admission medications  Medication Sig Start Date End Date Taking? Authorizing Provider  albuterol (VENTOLIN HFA) 108 (90 Base) MCG/ACT inhaler Inhale 1-2 puffs into the lungs every 6 (six) hours as needed for wheezing or shortness of breath. 02/14/20  Yes Tanner, Lyndon Code., PA-C  bisacodyl (DULCOLAX) 5 MG EC tablet Take 10 mg by mouth daily.   Yes [provider]  buPROPion (WELLBUTRIN XL) 150 MG 24 hr tablet Take 150 mg by mouth in the morning and at bedtime. 04/14/20  Yes [provider]  dexamethasone (DECADRON) 2 MG tablet Take 2 mg by mouth 2 (two) times daily. 03/09/20  Yes [provider]  gabapentin (NEURONTIN) 300  MG capsule 2 capsules twice daily Patient taking differently: Take 300-600 mg by mouth 2 (two) times daily. Take 1 capsule (300 mg) in the morning & Take 2 capsules (600 mg) at bedtime 02/16/20  Yes Tanner, Lyndon Code., PA-C  HYDROmorphone (DILAUDID) 4 MG tablet Take 8 mg by mouth every 4 (four) hours as needed for moderate pain. 04/07/20  Yes [provider]  LORazepam (ATIVAN) 0.5 MG tablet Take 1 tablet (0.5 mg total) by mouth every 6 (six) hours as needed for anxiety (nausea). Patient taking differently: Take 0.5 mg by mouth at bedtime. 03/09/20  Yes Nicholas Lose, MD  methadone (DOLOPHINE) 10 MG tablet Take 20-25 mg by mouth See admin instructions. 20 Mg In the morning and after lunch then 25 MG in the evening   Yes [provider]  omeprazole (PRILOSEC) 20 MG capsule Take 20 mg by mouth 2 (two) times daily. 04/07/20  Yes [provider]  senna (SENOKOT) 8.6 MG tablet Take 2 tablets by mouth at bedtime.   Yes [provider]  naloxone Karma Greaser) 4 MG/0.1ML LIQD nasal spray kit Spray once in one nostril prn overdose, may repeat x 1 01/24/19   Hayden Pedro, PA-C    Allergies    Denosumab, Statins, Sumatriptan, and Tape  Review of Systems   Review of Systems  Constitutional: Negative for chills and fever.  HENT: Negative for ear pain and sore throat.   Eyes: Negative for pain and visual disturbance.  Respiratory: Negative for cough and shortness of breath.   Cardiovascular: Negative for chest pain and palpitations.  Gastrointestinal: Positive for abdominal pain. Negative for vomiting.  Genitourinary: Negative for dysuria and hematuria.  Musculoskeletal: Positive for back pain. Negative for arthralgias.  Skin: Negative for color change and rash.  Neurological: Negative for seizures and syncope.  All other systems reviewed and are negative.   Physical Exam Updated Vital Signs BP 132/87   Pulse 92   Temp 98.7 F (37.1 C) (Oral)   Resp 18   SpO2 93%    Physical Exam Vitals and nursing note reviewed.  Constitutional:      General: She is in acute distress.     Appearance: She is well-developed.     Comments: Appears in pain  HENT:     Head: Normocephalic and atraumatic.  Eyes:     Conjunctiva/sclera: Conjunctivae normal.  Cardiovascular:     Rate and Rhythm: Normal rate and regular rhythm.     Heart sounds: No murmur heard.   Pulmonary:     Effort: Pulmonary effort is normal. No respiratory distress.     Breath sounds: Normal breath sounds.  Abdominal:     Palpations: Abdomen is soft.     Tenderness: There is abdominal tenderness.     Comments: Very mild, trace right upper quadrant abdominal tenderness.  Negative Murphy's.  No flank tenderness.  No lower quadrant tenderness bilaterally  Musculoskeletal:       Arms:     Cervical back: Neck supple.     Comments: Point tenderness to palpation to the right posterior rib cage, no flank tenderness bilaterally  Skin:    General: Skin is warm and dry.  Neurological:     Mental Status: She is alert.     ED Results / Procedures / Treatments   Labs (all labs ordered are listed, but only abnormal results are displayed) Labs Reviewed  CBC WITH DIFFERENTIAL/PLATELET - Abnormal; Notable for the following components:      Result Value   RBC 3.00 (*)    Hemoglobin 8.4 (*)    HCT 27.4 (*)    RDW 21.9 (*)    Platelets 109 (*)    nRBC 0.3 (*)    Abs Immature Granulocytes 0.12 (*)    All other components within normal limits  COMPREHENSIVE METABOLIC PANEL - Abnormal; Notable for the following components:   Glucose, Bld 106 (*)    Albumin 2.9 (*)    AST 91 (*)    ALT 117 (*)    Alkaline Phosphatase 189 (*)    All other components within normal limits  RESP PANEL BY RT-PCR (FLU A&B, COVID) ARPGX2  LIPASE, BLOOD    EKG None  Radiology CT CHEST ABDOMEN PELVIS W CONTRAST  Result Date: 04/15/2020 CLINICAL DATA:  Right rib and abdominal pain. History of metastatic breast  cancer. EXAM: CT CHEST, ABDOMEN, AND PELVIS WITH CONTRAST TECHNIQUE: Multidetector CT imaging of the chest, abdomen and pelvis was performed following the standard protocol during bolus administration of intravenous contrast. CONTRAST:  176m OMNIPAQUE IOHEXOL 300 MG/ML  SOLN COMPARISON:  Right upper quadrant earlier today. Chest CT 12/18/2019, most recent abdominal CT 10/22/2019 FINDINGS: CT CHEST FINDINGS Cardiovascular: Normal heart size. Thoracic aorta is normal in caliber. No acute aortic findings. Accessed right chest port with tip in the SVC. No pericardial effusion or thickening. Mediastinum/Nodes: 9 mm right hilar node versus central lung nodule, series 2, image 26. No mediastinal adenopathy. No esophageal wall thickening. Left axillary surgical clips. No axillary adenopathy. Lungs/Pleura: Progression in pulmonary metastatic disease with increased size and number of innumerable pulmonary nodules throughout both lungs. Index nodule in the left upper lobe measures 2.2 x 2.2 cm, series 4, image 57, previously 1.7 x 1.5 cm. Index nodule in the right middle lobe measures 2.3 x 1.7 cm, series 4, image 85, previously 1.4 x 1.5 cm. Irregular pleural thickening in the left hemithorax has progressed, with increasing thickening along the inter lobar fissure. Again seen consolidation involving the lingula, similar. No frank pleural effusion. Musculoskeletal: New lucent lesion within posterior right tenth rib with possible pathologic fracture, series 4, image 111. Lucent lesion within posterolateral right eighth rib, series 4, image 80. Questionable small lucent lesion involving left posterior eighth rib, series 4, image 76. Increased size of medial right clavicular lucent lesion. Stable sternal body lesion with peripheral sclerosis. Thoracic spine is assessed on concurrent thoracic spine CT, however progressive thoracic osseous metastatic disease. CT ABDOMEN PELVIS FINDINGS Hepatobiliary: 2 subcentimeter hypodensities  in the right left lobe of the liver, both on series 2, image 56, not definitively seen on prior exam. Decompressed gallbladder. No biliary dilatation. Pancreas: No ductal dilatation or inflammation.  No pancreatic mass. Spleen: Normal in size without focal abnormality. Adrenals/Urinary Tract: New left adrenal nodule measuring 11 mm. Subcentimeter nodularity of the right adrenal gland.  There are 2 tiny subcentimeter low-density lesions in the upper right kidney, series 2, image 55, upper left kidney series 2, image 56, with slightly larger lesions in the lower right kidney, too lesions on series 2, image 68. These were not seen on prior exam. Slight heterogeneous bilateral renal enhancement. There is no hydronephrosis. Urinary bladder is unremarkable. Stomach/Bowel: Ingested material within the stomach. No small bowel obstruction or inflammation, there is fecalization of distal small bowel contents. Large colonic stool burden involving the ascending, transverse, and proximal descending colon. There is transverse colonic tortuosity. No wall thickening or inflammation. Vascular/Lymphatic: Normal caliber abdominal aorta. Patent portal vein. Patent splenic vein. No definite enlarged lymph nodes in the abdomen or pelvis. Reproductive: The uterus is unremarkable. The ovaries are not well-defined on the current exam. Other: No ascites. No evidence of omental or peritoneal deposits are nodularity. Musculoskeletal: Lumbar spine assessed on concurrent lumbar reformats, reported separately. Many of the known lucent lesions throughout the bony pelvis have increased in size, for example lesion involving the left inferior ramus, no of cortical breakthrough, series 2, image 122. Central right acetabular lesion also has cortical breakthrough, series 2, image 112. Increasing lesions involving the right ischium and iliac bone, as well as the sacrum. Increasing loosened involving the left ischium and iliac bone. IMPRESSION: 1.  Progression in pulmonary metastatic disease with increased size and number of innumerable pulmonary nodules throughout both lungs. Irregular pleural thickening in the left hemithorax has progressed. 2. New left adrenal nodule measuring 11 mm, most consistent with metastatic disease. 3. New tiny subcentimeter hypodensities in the right and left lobe of the liver, not definitively seen on prior exam, suspicious for metastatic disease. 4. New small low-density lesions in both kidneys, also suspicious for metastatic disease, though indeterminate. 5. Large colonic stool burden with fecalization of distal small bowel contents, suggesting slow transit. No bowel obstruction or inflammation. 6. Progression in osseous metastatic disease. There are new right rib lesions involving the posterior eighth and tenth ribs, tenth rib fracture may have pathologic fracture in be cause of patient pain. 7. Thoracic and lumbar spine reported separately. Electronically Signed   By: Keith Rake M.D.   On: 04/15/2020 21:19   CT T-SPINE NO CHARGE  Result Date: 04/15/2020 CLINICAL DATA:  Back pain. Known osseous metastatic disease to bone. EXAM: CT THORACIC SPINE WITHOUT CONTRAST TECHNIQUE: Multidetector CT images of the thoracic were obtained using the standard protocol without intravenous contrast. COMPARISON:  Thoracic MRI 02/28/2020 FINDINGS: Alignment: Exaggerated kyphosis of the upper thoracic spine related to T3 compression fracture. Vertebrae: Known multifocal thoracic osseous metastatic disease. Pathologic compression fracture of T3 has progressed, now with greater than 75% loss of height anteriorly. Lucent lesion involves the posterior cortex. Difficult to assess for epidural component or canal compromise due to angulation. Pathologic moderate T5 compression fracture is new. There is buckling of the posterior cortex. Lesion involving posterior left aspect of T10 vertebral body has extraosseous extension and mild mass effect  on the canal. Paraspinal and other soft tissues: Multiple rib lesions. Pulmonary nodules assessed on concurrent chest CT, reported separately. Disc levels: Scattered degenerative disc disease. IMPRESSION: 1. Known multifocal thoracic osseous metastatic disease. 2. Pathologic compression fracture of T3 has progressed, now with greater than 75% loss of height anteriorly. Increased kyphosis at this level. Lucent lesion involves the posterior cortex. Difficult to assess for epidural component or canal compromise due to angulation. 3. Pathologic moderate T5 compression fracture is new. There is cortical buckling posteriorly but no  canal compromise. 4. Lesion involving posterior left aspect of T10 vertebral body has extraosseous extension and mild mass effect on the canal. Electronically Signed   By: Keith Rake M.D.   On: 04/15/2020 21:30   CT L-SPINE NO CHARGE  Result Date: 04/15/2020 CLINICAL DATA:  Back pain. Breast cancer with known osseous metastatic disease. EXAM: CT LUMBAR SPINE WITHOUT CONTRAST TECHNIQUE: Multidetector CT imaging of the lumbar spine was performed without intravenous contrast administration. Multiplanar CT image reconstructions were also generated. COMPARISON:  MRI 02/28/2020 FINDINGS: Segmentation: 5 lumbar type vertebrae. Alignment: Normal. Vertebrae: Multifocal osseous metastatic disease. Lucent lesion involving L4 with mild central endplate depression of superior and inferior endplate, new. No bony retropulsion. L2 vertebral body lucent lesion is similar. L3 lesion involves the right transverse process and pedicle. There is also an L3 vertebral body that approaches the posterior cortex. No convincing epidural extension. L3 also is a spinous process lesion. Paraspinal and other soft tissues: Subcentimeter subcutaneous lesion in the right gluteal fat, series 1, image 39. Disc levels: L3 lesion approaches the posterior cortex but no definite epidural component or canal compromise.  IMPRESSION: 1. [place] multifocal osseous metastatic disease. Lucent lesion involving L4 with mild central endplate depression of superior and inferior endplate, new from prior MRI. No bony retropulsion. 2. L3 lesion approaches the posterior cortex but no definite epidural component or canal compromise. Electronically Signed   By: Keith Rake M.D.   On: 04/15/2020 21:24   US Abdomen Limited RUQ (LIVER/GB)  Result Date: 04/15/2020 CLINICAL DATA:  Right upper quadrant pain. History of metastatic breast cancer. EXAM: ULTRASOUND ABDOMEN LIMITED RIGHT UPPER QUADRANT COMPARISON:  None. FINDINGS: Gallbladder: The gallbladder is partially contracted. No gallstones or wall thickening visualized (2.2 mm). No sonographic Murphy sign noted by sonographer. Common bile duct: Diameter: 4.7 mm Liver: Limited in evaluation secondary to overlying bowel gas. No focal lesion identified. Within normal limits in parenchymal echogenicity. Portal vein is patent on color Doppler imaging with normal direction of blood flow towards the liver. Other: None. IMPRESSION: 1. Limited study, as described above, without evidence of cholelithiasis or acute cholecystitis. Electronically Signed   By: Virgina Norfolk M.D.   On: 04/15/2020 18:11    Procedures Procedures   Medications Ordered in ED Medications  naloxone (NARCAN) injection 0.4 mg (has no administration in time range)    And  sodium chloride flush (NS) 0.9 % injection 9 mL (has no administration in time range)  ondansetron (ZOFRAN) injection 4 mg (has no administration in time range)  diphenhydrAMINE (BENADRYL) injection 12.5 mg (has no administration in time range)    Or  diphenhydrAMINE (BENADRYL) 12.5 MG/5ML elixir 12.5 mg (has no administration in time range)  HYDROmorphone (DILAUDID) 1 mg/mL PCA injection (has no administration in time range)  HYDROmorphone (DILAUDID) injection 2 mg (has no administration in time range)  HYDROmorphone (DILAUDID) injection 1 mg  (1 mg Intravenous Given 04/15/20 1742)  HYDROmorphone (DILAUDID) injection 1 mg (1 mg Intravenous Given 04/15/20 1832)  morphine 4 MG/ML injection 4 mg (4 mg Intravenous Given 04/15/20 1906)  iohexol (OMNIPAQUE) 300 MG/ML solution 100 mL (100 mLs Intravenous Contrast Given 04/15/20 2030)  HYDROmorphone (DILAUDID) injection 2 mg (2 mg Intravenous Given 04/15/20 2144)    ED Course  I have reviewed the triage vital signs and the nursing notes.  Pertinent labs & imaging results that were available during my care of the patient were reviewed by me and considered in my medical decision making (see chart for details).  MDM Rules/Calculators/A&P                          45 year old female with metastatic breast cancer presents to the ER with right back pain which radiated into the abdomen.  On arrival, she peers significantly in pain, tearful, cannot sit still.  Her vitals on arrival were overall reassuring, her blood pressure was a little bit elevated at 142/100, however afebrile, not tachycardic, tachypneic or hypoxic.  Physical exam with point tenderness to the right posterior rib cage, mild trace abdominal tenderness to the right upper quadrant, but negative Murphy's.  Labs and imaging ordered, reviewed and interpreted by me. -CBC without leukocytosis or leukopenia, stable hemoglobin.  CMP without any significant electrolyte abnormalities,, normal renal function.  She does have evidence of new transaminitis, with an AST of 91 and an ALT of 189, which appears to be stable  -Right upper quadrant ultrasound with significant gas, limited study, no evidence of gallstones -Given inconclusive right upper quadrant ultrasound, a CT chest, abdomen, pelvis and no charge T and L-spine images were ordered to evaluate for possible metastases.  CT with progression pulmonary metastatic disease, new left adrenal nodule, new hypodensities on the right and left lobes of the liver as well as the kidneys.  She does have  evidence of large colonic stool burden, but no evidence of small bowel obstruction.  She also has progression in osseous metastatic disease.  She has new right rib lesions involving the posterior eighth and 10th rib with rib fractures in the 10th rib, likely pathologic fracture.  Patient received multiple rounds of pain medication, Dilaudid, morphine, with little improvement in her pain.  Unfortunately given the extent of her disease and in the amount of pain that she is in, she will require further admission for pain control.  I discussed this plan with her and she is agreeable.  Will consult hospitalist for admission.  Consulted Dr. Tollie Eth for admission.  Per his request, discussed prior PCA pump use.  Patient states that she had her pain under control prior to leaving for vacation, and thus went off the pump.  She states that the pump was taken away for vacation.  Settings of the pump prior to being taken off of it were Dilaudid 539m/100mL; 0.567mbasal rate; bolus 0.73m67mvery 15 minutes prn.  This was relayed to Dr. ShaCyd SilenceOrdered an additional 4 mg of Dilaudid, awaiting PCA pump.  Stable for admission.   Final Clinical Impression(s) / ED Diagnoses Final diagnoses:  RUQ abdominal pain  Back pain  Malignant neoplasm metastatic to bone ([facility] [place]  Rx / DC Orders ED Discharge Orders    None          BelLyndel Safe/16/22 2307    Tegeler, ChrGwenyth AllegraD 04/16/20 0010

## 2020-04-15 NOTE — H&P (Signed)
History and Physical    Laura Anthony SPQ:[id number] DOB: 1975-10-21 DOA: 04/15/2020  PCP: [name], MD  Patient coming from: Home   Chief Complaint:  Chief Complaint  Patient presents with  . Abdominal Pain     HPI:    45 year old female with past medical history of metastatic breast cancer, gastroesophageal reflux disease, anxiety disorder who presents to [facility] emergency department with back and shoulder pain.  Patient has been suffering from progressively worsening cancer-related pain at multiple sites for the past several months.  Up until approximately 2 weeks ago, patient was on an outpatient PCA pump with Dilaudid provided by her hospice company.  Patient was recently weaned off of the PCA pump for a final international trip together with the family considering her extremely poor prognosis.  Since the patient has arrived back in the night states she has developed a worsening of right shoulder pain that has been bothering her for approximately 1 month.  Patient is also been experiencing progressively worsening low back pain.  Patient describes her pain as sharp in quality, worse with movement, severe in intensity and radiating diffusely.  Patient denies any recent fevers or falls.  For the past several weeks the patient has achieved adequate pain control with scheduled methadone 3 times daily as well as as needed oral Dilaudid.  Unfortunately the patient's worsening pain medications are not able to manage her pain.  Patient is also complaining of associated worsening generalized weakness and poor appetite with intermittent vomiting, occurring at least once daily.  Patient is hungry for the most part however.  Due to patient's rapidly progressive pain the patient has had presented to [facility] emergency department for evaluation.    Upon evaluation in the emergency department CT imaging has revealed progressive metastatic disease in  multiple locations including evidence of progressive pulmonary metastatic disease, a new left adrenal nodule, new hypodensities in the right and left lobes of the liver suspicious for metastatic disease and progression of osseous metastatic disease in the right eighth and 10th ribs with evidence of pathologic fracture in the 10th rib.  Patient is also noted to have progressive pathologic compression fracture of T3 as well as T5.  This progressive osseous metastatic disease is felt to be the culprit for the patient's worsening pains.  Patient has been provided several doses of intravenous Dilaudid in the emergency department without any relief in her pain.  The hospitalist group has now been called to assess the patient for mission the hospital due to intractable pain.  Review of Systems:   Review of Systems  Constitutional: Positive for malaise/fatigue.  Gastrointestinal: Positive for nausea and vomiting.  Musculoskeletal: Positive for back pain, joint pain, myalgias and neck pain.  Neurological: Positive for weakness.  All other systems reviewed and are negative.   Past Medical History:  Diagnosis Date  . ADD (attention deficit disorder)   . Anemia   . Anxiety   . Breast cancer, left breast ([place])    S/P mastectomy 03/27/2017  . DVT (deep venous thrombosis) ([place]) 2017   calf left - probably due to [facility] pills-took eliquis x3 mos, nonthing now  . High cholesterol   . Impingement syndrome of right shoulder 07/2013  . Migraine    "usually 1/month" (03/28/2017)  . PONV (postoperative nausea and vomiting)   . Right bicipital tenosynovitis 07/2013  . Rotator cuff impingement syndrome of right shoulder 07/12/2013  . Seizures ([place])    x 1 as a  child - was never on anticonvulsants (03/28/2017)    Past Surgical History:  Procedure Laterality Date  . ADENOIDECTOMY  1981  . ANKLE ARTHROSCOPY Right   . BREAST BIOPSY Left 10/2016  . KNEE ARTHROSCOPY Right   . KNEE ARTHROSCOPY W/ ACL RECONSTRUCTION  Left   . LIPOSUCTION WITH LIPOFILLING Bilateral 01/05/2018   Procedure: LIPOFILLING FROM ABDOMEN TO BILATERAL CHEST;  Surgeon: Irene Limbo, MD;  Location: [place];  Service: Plastics;  Laterality: Bilateral;  . MASTECTOMY Left 03/27/2017   NIPPLE SPARING MASTECTOMY WITH RADIOACTIVE SEED TARGETED LYMPH NODE EXCISION AND LEFT AXILLARY SENTINEL LYMPH NODE BIOPSY  . MASTECTOMY Right 03/27/2017   RIGHT PROPHYLACTIC NIPPLE SPARING MASTECTOMY  . NIPPLE SPARING MASTECTOMY Right 03/27/2017   Procedure: RIGHT PROPHYLACTIC NIPPLE SPARING MASTECTOMY;  Surgeon: Rolm Bookbinder, MD;  Location: [place];  Service: General;  Laterality: Right;  . PORT-A-CATH REMOVAL Right 01/05/2018   Procedure: REMOVAL RIGHT CHEST PORT;  Surgeon: Irene Limbo, MD;  Location: [place];  Service: Plastics;  Laterality: Right;  . PORTACATH PLACEMENT N/A 11/01/2016   Procedure: INSERTION PORT-A-CATH WITH [place];  Surgeon: Rolm Bookbinder, MD;  Location: [place];  Service: General;  Laterality: N/A;  . PORTACATH PLACEMENT N/A 08/23/2018   Procedure: INSERTION PORT-A-CATH WITH ULTRASOUND;  Surgeon: Rolm Bookbinder, MD;  Location: [place];  Service: General;  Laterality: N/A;  . RADIOACTIVE SEED GUIDED AXILLARY SENTINEL LYMPH NODE Left 03/27/2017   Procedure: LEFT NIPPLE SPARING MASTECTOMY WITH RADIOACTIVE SEED TARGETED LYMPH NODE EXCISION AND LEFT AXILLARY SENTINEL LYMPH NODE BIOPSY;  Surgeon: Rolm Bookbinder, MD;  Location: [place];  Service: General;  Laterality: Left;  REQUESTS RNFA  . RECONSTRUCTION BREAST IMMEDIATE / DELAYED W/ TISSUE EXPANDER Bilateral 03/27/2017   BILATERAL BREAST RECONSTRUCTION WITH PLACEMENT OF TISSUE EXPANDER AND ALLODERM  . REMOVAL OF BILATERAL TISSUE EXPANDERS WITH PLACEMENT OF BILATERAL BREAST IMPLANTS Bilateral 01/05/2018   Procedure: REMOVAL OF BILATERAL TISSUE EXPANDERS WITH PLACEMENT OF BILATERAL BREAST  IMPLANTS;  Surgeon: Irene Limbo, MD;  Location: [place];  Service: Plastics;  Laterality: Bilateral;  . SEPTOPLASTY WITH ETHMOIDECTOMY, AND MAXILLARY ANTROSTOMY  10/29/2010   bilat. max. antrostomy with left max. stripping; left ant. ethmoidectomy; right total ethmoidectomy; sphenoidotomy  . SHOULDER ARTHROSCOPY WITH SUBACROMIAL DECOMPRESSION AND BICEP TENDON REPAIR Right 07/12/2013   Procedure: RIGHT SHOULDER ARTHROSCOPY DEBRIDEMENT EXTENSIVE DECOMPRESSION SUBACROMIAL PARTIAL ACROMIOPLASTY;  Surgeon: Johnny Bridge, MD;  Location: [place];  Service: Orthopedics;  Laterality: Right;  . WRIST ARTHROSCOPY  01/17/2012   Procedure: ARTHROSCOPY WRIST; right wrist Surgeon: Tennis Must, MD;  Location: [place];  Service: Orthopedics;  Laterality: Right;  RIGHT WRIST ARTHROSCOPY WITH TRIANGULAR FIBROCARTILAGE COMPLEX REPAIR AND DEBRIDEMENT      reports that she has never smoked. She has never used smokeless tobacco. She reports current alcohol use. She reports that she does not use drugs.  Allergies  Allergen Reactions  . Denosumab Other (See Comments)    Osteonecrosis of the jaw  . Statins Other (See Comments)    Leg pain  . Sumatriptan Other (See Comments)    Numbness to face   . Tape Rash    Rash from dressing over port-a-cath     Family History  Problem Relation Age of Onset  . Breast cancer Mother 63       triple negative  . Leukemia Father   . Lung cancer Father   . Heart attack Maternal Uncle   .  Prostate cancer Paternal Uncle   . COPD Paternal Grandmother   . Heart disease Paternal Grandfather   . Prostate cancer Paternal Uncle   . Leukemia Cousin      Prior to Admission medications   Medication Sig Start Date End Date Taking? Authorizing Provider  albuterol (VENTOLIN HFA) 108 (90 Base) MCG/ACT inhaler Inhale 1-2 puffs into the lungs every 6 (six) hours as needed for wheezing or shortness of breath. 02/14/20  Yes  Tanner, Lyndon Code., PA-C  buPROPion (WELLBUTRIN XL) 150 MG 24 hr tablet Take 150 mg by mouth in the morning and at bedtime. 04/14/20  Yes [provider]  dexamethasone (DECADRON) 2 MG tablet Take 2 mg by mouth 2 (two) times daily. 03/09/20  Yes [provider]  gabapentin (NEURONTIN) 300 MG capsule 2 capsules twice daily Patient taking differently: Take 300-600 mg by mouth 2 (two) times daily. Take 1 capsule (300 mg) in the morning & Take 2 capsules (600 mg) at bedtime 02/16/20  Yes Tanner, Lyndon Code., PA-C  LORazepam (ATIVAN) 0.5 MG tablet Take 1 tablet (0.5 mg total) by mouth every 6 (six) hours as needed for anxiety (nausea). Patient taking differently: Take 0.5 mg by mouth at bedtime. 03/09/20  Yes Nicholas Lose, MD  HYDROmorphone (DILAUDID) 4 MG tablet TAKE 1-2 TABLET EVERY 4 HOURS AS NEEDED FOR SEVERE PAIN 04/07/20   [provider]  methadone (DOLOPHINE) 10 MG tablet Take by mouth.    [provider]  naloxone Karma Greaser) 4 MG/0.1ML LIQD nasal spray kit Spray once in one nostril prn overdose, may repeat x 1 01/24/19   Hayden Pedro, PA-C  omeprazole (PRILOSEC) 20 MG capsule Take 20 mg by mouth 2 (two) times daily. 04/07/20   [provider]    Physical Exam: Vitals:   04/15/20 2115 04/15/20 2216 04/15/20 2230 04/15/20 2315  BP: 123/81 123/82 132/87 140/86  Pulse: 80 86 92 80  Resp:  18    Temp:  98.7 F (37.1 C)    TempSrc:  Oral    SpO2: 98% 99% 93% 95%    Constitutional: Acute alert and oriented x3, patient is in severe distress due to pain  skin: no rashes, no lesions, somewhat poor skin turgor noted.   Eyes: Pupils are equally reactive to light.  No evidence of scleral icterus or conjunctival pallor.  ENMT: Dry mucous membranes noted.  Posterior pharynx clear of any exudate or lesions.   Neck: normal, supple, no masses, no thyromegaly.  No evidence of jugular venous distension.   Respiratory: Scattered rhonchi bilaterally.  Mild bibasilar  rales.  No evidence of wheezing.  No accessory muscle use.  Cardiovascular: Regular rate and rhythm, no murmurs / rubs / gallops. No extremity edema. 2+ pedal pulses. No carotid bruits.  Chest:   Significant tenderness along the right anterior and posterior chest without evidence of crepitus or deformity.   Back:   Exquisite tenderness particularly along the thoracolumbar spine without evidence of crepitus or deformity.  Abdomen: Abdomen is soft and nontender.  No evidence of intra-abdominal masses.  Positive bowel sounds noted in all quadrants.   Musculoskeletal: Pain with both passive and active range of motion of the bilateral upper extremities particularly in the shoulder joint.  No joint deformity upper and lower extremities. Good ROM, no contractures. Normal muscle tone.  Neurologic: CN 2-12 grossly intact. Sensation intact.  Patient moving all 4 extremities spontaneously.  Patient is following all commands.  Patient is responsive to verbal stimuli.  Psychiatric: Patient is extremely distressed due to the degree of her disease exacerbated by pain.  Appropriate affect.  Patient seems to possess insight as to their current situation.     Labs on Admission: I have personally reviewed following labs and imaging studies -   CBC: Recent Labs  Lab 04/15/20 1723  WBC 9.1  NEUTROABS 7.5  HGB 8.4*  HCT 27.4*  MCV 91.3  PLT 371*   Basic Metabolic Panel: Recent Labs  Lab 04/15/20 1723  NA 137  K 3.8  CL 99  CO2 25  GLUCOSE 106*  BUN 20  CREATININE 0.67  CALCIUM 9.0   GFR: CrCl cannot be calculated (Unknown ideal weight.). Liver Function Tests: Recent Labs  Lab 04/15/20 1723  AST 91*  ALT 117*  ALKPHOS 189*  BILITOT 0.5  PROT 7.5  ALBUMIN 2.9*   Recent Labs  Lab 04/15/20 1723  LIPASE 22   No results for input(s): AMMONIA in the last 168 hours. Coagulation Profile: No results for input(s): INR, PROTIME in the last 168 hours. Cardiac Enzymes: No results for input(s):  CKTOTAL, CKMB, CKMBINDEX, TROPONINI in the last 168 hours. BNP (last 3 results) No results for input(s): PROBNP in the last 8760 hours. HbA1C: No results for input(s): HGBA1C in the last 72 hours. CBG: No results for input(s): GLUCAP in the last 168 hours. Lipid Profile: No results for input(s): CHOL, HDL, LDLCALC, TRIG, CHOLHDL, LDLDIRECT in the last 72 hours. Thyroid Function Tests: No results for input(s): TSH, T4TOTAL, FREET4, T3FREE, THYROIDAB in the last 72 hours. Anemia Panel: No results for input(s): VITAMINB12, FOLATE, FERRITIN, TIBC, IRON, RETICCTPCT in the last 72 hours. Urine analysis:    Component Value Date/Time   COLORURINE YELLOW 07/22/2019 1900   APPEARANCEUR CLEAR 07/22/2019 1900   LABSPEC 1.010 07/22/2019 1900   PHURINE 5.0 07/22/2019 1900   GLUCOSEU NEGATIVE 07/22/2019 1900   HGBUR NEGATIVE 07/22/2019 1900   BILIRUBINUR NEGATIVE 07/22/2019 1900   KETONESUR NEGATIVE 07/22/2019 1900   PROTEINUR NEGATIVE 07/22/2019 1900   NITRITE NEGATIVE 07/22/2019 1900   LEUKOCYTESUR NEGATIVE 07/22/2019 1900    Radiological Exams on Admission - Personally Reviewed: CT CHEST ABDOMEN PELVIS W CONTRAST  Result Date: 04/15/2020 CLINICAL DATA:  Right rib and abdominal pain. History of metastatic breast cancer. EXAM: CT CHEST, ABDOMEN, AND PELVIS WITH CONTRAST TECHNIQUE: Multidetector CT imaging of the chest, abdomen and pelvis was performed following the standard protocol during bolus administration of intravenous contrast. CONTRAST:  129m OMNIPAQUE IOHEXOL 300 MG/ML  SOLN COMPARISON:  Right upper quadrant earlier today. Chest CT 12/18/2019, most recent abdominal CT 10/22/2019 FINDINGS: CT CHEST FINDINGS Cardiovascular: Normal heart size. Thoracic aorta is normal in caliber. No acute aortic findings. Accessed right chest port with tip in the SVC. No pericardial effusion or thickening. Mediastinum/Nodes: 9 mm right hilar node versus central lung nodule, series 2, image 26. No mediastinal  adenopathy. No esophageal wall thickening. Left axillary surgical clips. No axillary adenopathy. Lungs/Pleura: Progression in pulmonary metastatic disease with increased size and number of innumerable pulmonary nodules throughout both lungs. Index nodule in the left upper lobe measures 2.2 x 2.2 cm, series 4, image 57, previously 1.7 x 1.5 cm. Index nodule in the right middle lobe measures 2.3 x 1.7 cm, series 4, image 85, previously 1.4 x 1.5 cm. Irregular pleural thickening in the left hemithorax has progressed, with increasing thickening along the inter lobar fissure. Again seen consolidation involving the lingula, similar. No frank pleural effusion. Musculoskeletal: New lucent lesion within posterior  right tenth rib with possible pathologic fracture, series 4, image 111. Lucent lesion within posterolateral right eighth rib, series 4, image 80. Questionable small lucent lesion involving left posterior eighth rib, series 4, image 76. Increased size of medial right clavicular lucent lesion. Stable sternal body lesion with peripheral sclerosis. Thoracic spine is assessed on concurrent thoracic spine CT, however progressive thoracic osseous metastatic disease. CT ABDOMEN PELVIS FINDINGS Hepatobiliary: 2 subcentimeter hypodensities in the right left lobe of the liver, both on series 2, image 56, not definitively seen on prior exam. Decompressed gallbladder. No biliary dilatation. Pancreas: No ductal dilatation or inflammation.  No pancreatic mass. Spleen: Normal in size without focal abnormality. Adrenals/Urinary Tract: New left adrenal nodule measuring 11 mm. Subcentimeter nodularity of the right adrenal gland. There are 2 tiny subcentimeter low-density lesions in the upper right kidney, series 2, image 55, upper left kidney series 2, image 56, with slightly larger lesions in the lower right kidney, too lesions on series 2, image 68. These were not seen on prior exam. Slight heterogeneous bilateral renal enhancement.  There is no hydronephrosis. Urinary bladder is unremarkable. Stomach/Bowel: Ingested material within the stomach. No small bowel obstruction or inflammation, there is fecalization of distal small bowel contents. Large colonic stool burden involving the ascending, transverse, and proximal descending colon. There is transverse colonic tortuosity. No wall thickening or inflammation. Vascular/Lymphatic: Normal caliber abdominal aorta. Patent portal vein. Patent splenic vein. No definite enlarged lymph nodes in the abdomen or pelvis. Reproductive: The uterus is unremarkable. The ovaries are not well-defined on the current exam. Other: No ascites. No evidence of omental or peritoneal deposits are nodularity. Musculoskeletal: Lumbar spine assessed on concurrent lumbar reformats, reported separately. Many of the known lucent lesions throughout the bony pelvis have increased in size, for example lesion involving the left inferior ramus, no of cortical breakthrough, series 2, image 122. Central right acetabular lesion also has cortical breakthrough, series 2, image 112. Increasing lesions involving the right ischium and iliac bone, as well as the sacrum. Increasing loosened involving the left ischium and iliac bone. IMPRESSION: 1. Progression in pulmonary metastatic disease with increased size and number of innumerable pulmonary nodules throughout both lungs. Irregular pleural thickening in the left hemithorax has progressed. 2. New left adrenal nodule measuring 11 mm, most consistent with metastatic disease. 3. New tiny subcentimeter hypodensities in the right and left lobe of the liver, not definitively seen on prior exam, suspicious for metastatic disease. 4. New small low-density lesions in both kidneys, also suspicious for metastatic disease, though indeterminate. 5. Large colonic stool burden with fecalization of distal small bowel contents, suggesting slow transit. No bowel obstruction or inflammation. 6. Progression  in osseous metastatic disease. There are new right rib lesions involving the posterior eighth and tenth ribs, tenth rib fracture may have pathologic fracture in be cause of patient pain. 7. Thoracic and lumbar spine reported separately. Electronically Signed   By: Keith Rake M.D.   On: 04/15/2020 21:19   CT T-SPINE NO CHARGE  Result Date: 04/15/2020 CLINICAL DATA:  Back pain. Known osseous metastatic disease to bone. EXAM: CT THORACIC SPINE WITHOUT CONTRAST TECHNIQUE: Multidetector CT images of the thoracic were obtained using the standard protocol without intravenous contrast. COMPARISON:  Thoracic MRI 02/28/2020 FINDINGS: Alignment: Exaggerated kyphosis of the upper thoracic spine related to T3 compression fracture. Vertebrae: Known multifocal thoracic osseous metastatic disease. Pathologic compression fracture of T3 has progressed, now with greater than 75% loss of height anteriorly. Lucent lesion involves the posterior cortex.  Difficult to assess for epidural component or canal compromise due to angulation. Pathologic moderate T5 compression fracture is new. There is buckling of the posterior cortex. Lesion involving posterior left aspect of T10 vertebral body has extraosseous extension and mild mass effect on the canal. Paraspinal and other soft tissues: Multiple rib lesions. Pulmonary nodules assessed on concurrent chest CT, reported separately. Disc levels: Scattered degenerative disc disease. IMPRESSION: 1. Known multifocal thoracic osseous metastatic disease. 2. Pathologic compression fracture of T3 has progressed, now with greater than 75% loss of height anteriorly. Increased kyphosis at this level. Lucent lesion involves the posterior cortex. Difficult to assess for epidural component or canal compromise due to angulation. 3. Pathologic moderate T5 compression fracture is new. There is cortical buckling posteriorly but no canal compromise. 4. Lesion involving posterior left aspect of T10  vertebral body has extraosseous extension and mild mass effect on the canal. Electronically Signed   By: Keith Rake M.D.   On: 04/15/2020 21:30   CT L-SPINE NO CHARGE  Result Date: 04/15/2020 CLINICAL DATA:  Back pain. Breast cancer with known osseous metastatic disease. EXAM: CT LUMBAR SPINE WITHOUT CONTRAST TECHNIQUE: Multidetector CT imaging of the lumbar spine was performed without intravenous contrast administration. Multiplanar CT image reconstructions were also generated. COMPARISON:  MRI 02/28/2020 FINDINGS: Segmentation: 5 lumbar type vertebrae. Alignment: Normal. Vertebrae: Multifocal osseous metastatic disease. Lucent lesion involving L4 with mild central endplate depression of superior and inferior endplate, new. No bony retropulsion. L2 vertebral body lucent lesion is similar. L3 lesion involves the right transverse process and pedicle. There is also an L3 vertebral body that approaches the posterior cortex. No convincing epidural extension. L3 also is a spinous process lesion. Paraspinal and other soft tissues: Subcentimeter subcutaneous lesion in the right gluteal fat, series 1, image 39. Disc levels: L3 lesion approaches the posterior cortex but no definite epidural component or canal compromise. IMPRESSION: 1. [place]wn multifocal osseous metastatic disease. Lucent lesion involving L4 with mild central endplate depression of superior and inferior endplate, new from prior MRI. No bony retropulsion. 2. L3 lesion approaches the posterior cortex but no definite epidural component or canal compromise. Electronically Signed   By: Keith Rake M.D.   On: 04/15/2020 21:24   US Abdomen Limited RUQ (LIVER/GB)  Result Date: 04/15/2020 CLINICAL DATA:  Right upper quadrant pain. History of metastatic breast cancer. EXAM: ULTRASOUND ABDOMEN LIMITED RIGHT UPPER QUADRANT COMPARISON:  None. FINDINGS: Gallbladder: The gallbladder is partially contracted. No gallstones or wall thickening visualized (2.2  mm). No sonographic Murphy sign noted by sonographer. Common bile duct: Diameter: 4.7 mm Liver: Limited in evaluation secondary to overlying bowel gas. No focal lesion identified. Within normal limits in parenchymal echogenicity. Portal vein is patent on color Doppler imaging with normal direction of blood flow towards the liver. Other: None. IMPRESSION: 1. Limited study, as described above, without evidence of cholelithiasis or acute cholecystitis. Electronically Signed   By: Virgina Norfolk M.D.   On: 04/15/2020 18:11    Telemetry: Personally reviewed.  Rhythm is Normal sinus rhythm with heart rate of 90BPM.  No dynamic ST segment changes appreciated.  Assessment/Plan Principal Problem:  Cancer-related breakthrough pain due to progressive osseous metastatic disease of the spine and ribs  Patient is experiencing severe intractable pain, particularly of the low back and right shoulder that has progressively worsened in the past 1 to 2 weeks  CT imaging performed in the emergency department reveals multiple areas of extensive progressive osseous disease including compression fractures of the thoracic  spine and ribs that are primarily contributing to patient's severe progressive pain  Furthermore, patient has an extremely high tolerance of opiates due to having been on a Dilaudid PCA pump as an outpatient up until 2 weeks ago.  Patient is currently being managed with methadone 3 times daily with superimposed as needed oral Dilaudid  Considering patient's severe progressive osseous disease and immensely intractable pain I believe the best thing at this point is to place patient back on a Dilaudid PCA pump  Home hospice agency has been contacted and we obtained the previous PCA pump settings will titrate these medications somewhat in place patient on Dilaudid PCA pump with 1 mg hourly basal rate and 0.4 mg bolus dosing with a 10-minute lockout.  This can be titrated upwards as necessary.  We will  additionally provide patient with an additional dose of intravenous Decadron in an effort to improve bone pain and increase scheduled dosing of twice daily oral Decadron starting tomorrow.  Based on how successful these measures are not controlling the patient's pain can decision can be made whether patient can be transitioned to an oral regimen versus going home once again with PCA pump  Consultation placed for palliative care for assistance in pain management.   Continuous pulse oximetry and telemetry monitoring considering high-dose of opiate therapy with as needed Narcan ordered  Active Problems:   Malignant neoplasm of lower-outer quadrant of left breast of female, estrogen receptor negative (HCC)   Extensive disease, patient is currently hospice with no active curative treatment in place  Diagnosed in 2018 in the left breast  Has been following with Dr. 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  Poor prognosis     Goals of care, counseling/discussion   Highly advanced metastatic disease with uncontrolled symptoms and poor prognosis  Patient is currently hospice patient.  She states that her goal of treatment on this visit is comfort but she desires to be able to go home and spend time with her family, however much time she still has left.  Patient and her husband whom I spoke with on the phone are very aware that patient's time is quite limited  I have advised them that we will attempt to get the patient's pain under control with the intent to get the patient home with her family but it is possible that patient's pain may best be managed in an inpatient hospice  setting  Palliative care consultation placed for assistance in planning going home once again with hospice versus inpatient hospice  Patient is currently DNR    Thrombocytopenia ([place])  No clinical evidence of bleeding  Conservative management    Code Status:  DNR Family Communication: Patient's condition has been discussed with the husband via phone conversation who has been updated on plan of care  Status is: Observation  The patient remains OBS appropriate and will d/c before 2 midnights.  Dispo: The patient is from: Home              Anticipated d/c is to: hospice              Patient currently is not medically stable to d/c.   Difficult to place patient  No        Vernelle Emerald MD [facility] Pager [phone]  If 7PM-7AM, please contact night-coverage [email or web address] Use universal [place] password for that web site. If you do not have the password, please call the hospital operator.  04/16/2020, 1:12 AM

## 2020-04-15 NOTE — ED Triage Notes (Signed)
Per pt, states right upper back pain radiating to RUQ-states a couple of days ago-uncontrolled pain-hospice RN advised patient to come to ED

## 2020-04-15 NOTE — ED Notes (Signed)
History of breast cancer- metastases to bone, brain-complaining of RUQ pain which is uncontrolled-on methadone three times a day and dilaudid for break thru pain-has [place] scheduled for tomorrow coming to ED for eval and pain control

## 2020-04-16 DIAGNOSIS — Z91048 Other nonmedicinal substance allergy status: Secondary | ICD-10-CM | POA: Diagnosis not present

## 2020-04-16 DIAGNOSIS — C7931 Secondary malignant neoplasm of brain: Secondary | ICD-10-CM | POA: Diagnosis present

## 2020-04-16 DIAGNOSIS — D696 Thrombocytopenia, unspecified: Secondary | ICD-10-CM | POA: Diagnosis present

## 2020-04-16 DIAGNOSIS — Z86718 Personal history of other venous thrombosis and embolism: Secondary | ICD-10-CM | POA: Diagnosis not present

## 2020-04-16 DIAGNOSIS — Z66 Do not resuscitate: Secondary | ICD-10-CM | POA: Diagnosis present

## 2020-04-16 DIAGNOSIS — F419 Anxiety disorder, unspecified: Secondary | ICD-10-CM | POA: Diagnosis present

## 2020-04-16 DIAGNOSIS — Z20822 Contact with and (suspected) exposure to covid-19: Secondary | ICD-10-CM | POA: Diagnosis present

## 2020-04-16 DIAGNOSIS — Z515 Encounter for palliative care: Secondary | ICD-10-CM | POA: Diagnosis not present

## 2020-04-16 DIAGNOSIS — C7972 Secondary malignant neoplasm of left adrenal gland: Secondary | ICD-10-CM | POA: Diagnosis present

## 2020-04-16 DIAGNOSIS — Z79899 Other long term (current) drug therapy: Secondary | ICD-10-CM | POA: Diagnosis not present

## 2020-04-16 DIAGNOSIS — E7800 Pure hypercholesterolemia, unspecified: Secondary | ICD-10-CM | POA: Diagnosis present

## 2020-04-16 DIAGNOSIS — Z803 Family history of malignant neoplasm of breast: Secondary | ICD-10-CM | POA: Diagnosis not present

## 2020-04-16 DIAGNOSIS — K219 Gastro-esophageal reflux disease without esophagitis: Secondary | ICD-10-CM | POA: Diagnosis present

## 2020-04-16 DIAGNOSIS — Z888 Allergy status to other drugs, medicaments and biological substances status: Secondary | ICD-10-CM | POA: Diagnosis not present

## 2020-04-16 DIAGNOSIS — C787 Secondary malignant neoplasm of liver and intrahepatic bile duct: Secondary | ICD-10-CM | POA: Diagnosis present

## 2020-04-16 DIAGNOSIS — C7951 Secondary malignant neoplasm of bone: Secondary | ICD-10-CM | POA: Diagnosis present

## 2020-04-16 DIAGNOSIS — C7800 Secondary malignant neoplasm of unspecified lung: Secondary | ICD-10-CM | POA: Diagnosis present

## 2020-04-16 DIAGNOSIS — D630 Anemia in neoplastic disease: Secondary | ICD-10-CM | POA: Diagnosis present

## 2020-04-16 DIAGNOSIS — Z9013 Acquired absence of bilateral breasts and nipples: Secondary | ICD-10-CM | POA: Diagnosis not present

## 2020-04-16 DIAGNOSIS — M549 Dorsalgia, unspecified: Secondary | ICD-10-CM | POA: Diagnosis present

## 2020-04-16 DIAGNOSIS — M4854XA Collapsed vertebra, not elsewhere classified, thoracic region, initial encounter for fracture: Secondary | ICD-10-CM | POA: Diagnosis present

## 2020-04-16 DIAGNOSIS — Z79891 Long term (current) use of opiate analgesic: Secondary | ICD-10-CM | POA: Diagnosis not present

## 2020-04-16 DIAGNOSIS — G893 Neoplasm related pain (acute) (chronic): Secondary | ICD-10-CM | POA: Diagnosis present

## 2020-04-16 DIAGNOSIS — Z825 Family history of asthma and other chronic lower respiratory diseases: Secondary | ICD-10-CM | POA: Diagnosis not present

## 2020-04-16 DIAGNOSIS — C50512 Malignant neoplasm of lower-outer quadrant of left female breast: Secondary | ICD-10-CM | POA: Diagnosis present

## 2020-04-16 LAB — CBC WITH DIFFERENTIAL/PLATELET
Abs Immature Granulocytes: 0.13 10*3/uL — ABNORMAL HIGH (ref 0.00–0.07)
Basophils Absolute: 0 10*3/uL (ref 0.0–0.1)
Basophils Relative: 0 %
Eosinophils Absolute: 0 10*3/uL (ref 0.0–0.5)
Eosinophils Relative: 0 %
HCT: 26.2 % — ABNORMAL LOW (ref 36.0–46.0)
Hemoglobin: 8.1 g/dL — ABNORMAL LOW (ref 12.0–15.0)
Immature Granulocytes: 2 %
Lymphocytes Relative: 9 %
Lymphs Abs: 0.7 10*3/uL (ref 0.7–4.0)
MCH: 28.2 pg (ref 26.0–34.0)
MCHC: 30.9 g/dL (ref 30.0–36.0)
MCV: 91.3 fL (ref 80.0–100.0)
Monocytes Absolute: 0.6 10*3/uL (ref 0.1–1.0)
Monocytes Relative: 8 %
Neutro Abs: 6.3 10*3/uL (ref 1.7–7.7)
Neutrophils Relative %: 81 %
Platelets: 95 10*3/uL — ABNORMAL LOW (ref 150–400)
RBC: 2.87 MIL/uL — ABNORMAL LOW (ref 3.87–5.11)
RDW: 22.3 % — ABNORMAL HIGH (ref 11.5–15.5)
WBC: 7.8 10*3/uL (ref 4.0–10.5)
nRBC: 0.4 % — ABNORMAL HIGH (ref 0.0–0.2)

## 2020-04-16 LAB — COMPREHENSIVE METABOLIC PANEL
ALT: 103 U/L — ABNORMAL HIGH (ref 0–44)
AST: 87 U/L — ABNORMAL HIGH (ref 15–41)
Albumin: 2.6 g/dL — ABNORMAL LOW (ref 3.5–5.0)
Alkaline Phosphatase: 166 U/L — ABNORMAL HIGH (ref 38–126)
Anion gap: 11 (ref 5–15)
BUN: 17 mg/dL (ref 6–20)
CO2: 25 mmol/L (ref 22–32)
Calcium: 8.5 mg/dL — ABNORMAL LOW (ref 8.9–10.3)
Chloride: 98 mmol/L (ref 98–111)
Creatinine, Ser: 0.64 mg/dL (ref 0.44–1.00)
GFR, Estimated: >60 mL/min (ref >60)
Glucose, Bld: 82 mg/dL (ref 70–99)
Potassium: 3.6 mmol/L (ref 3.5–5.1)
Sodium: 134 mmol/L — ABNORMAL LOW (ref 135–145)
Total Bilirubin: 0.7 mg/dL (ref 0.3–1.2)
Total Protein: 6.5 g/dL (ref 6.5–8.1)

## 2020-04-16 MED ORDER — GABAPENTIN 300 MG PO CAPS
300.0000 mg | ORAL_CAPSULE | Freq: Two times a day (BID) | ORAL | Status: DC
  Administered 2020-04-16 – 2020-04-17 (×4): 300 mg via ORAL
  Filled 2020-04-16 (×4): qty 1

## 2020-04-16 MED ORDER — DIPHENHYDRAMINE HCL 50 MG/ML IJ SOLN: 12.5000 mg | Freq: Four times a day (QID) | INTRAMUSCULAR | Status: DC | PRN

## 2020-04-16 MED ORDER — HYDROMORPHONE HCL 1 MG/ML IJ SOLN: 1.0000 mg | INTRAMUSCULAR | Status: DC | PRN

## 2020-04-16 MED ORDER — KETOROLAC TROMETHAMINE 30 MG/ML IJ SOLN
30.0000 mg | Freq: Once | INTRAMUSCULAR | Status: AC
  Administered 2020-04-16: 30 mg via INTRAVENOUS
  Filled 2020-04-16: qty 1

## 2020-04-16 MED ORDER — DEXAMETHASONE SODIUM PHOSPHATE 10 MG/ML IJ SOLN
10.0000 mg | INTRAMUSCULAR | Status: DC
  Administered 2020-04-16 – 2020-04-17 (×2): 10 mg via INTRAVENOUS
  Filled 2020-04-16 (×2): qty 1

## 2020-04-16 MED ORDER — ONDANSETRON HCL 4 MG/2ML IJ SOLN: 4.0000 mg | Freq: Four times a day (QID) | INTRAMUSCULAR | Status: DC | PRN

## 2020-04-16 MED ORDER — NALOXONE HCL 0.4 MG/ML IJ SOLN
0.4000 mg | INTRAMUSCULAR | Status: DC | PRN
Start: 2020-04-16 — End: 2020-04-16

## 2020-04-16 MED ORDER — METHADONE HCL 10 MG PO TABS
20.0000 mg | ORAL_TABLET | ORAL | Status: DC
  Administered 2020-04-16 – 2020-04-17 (×5): 20 mg via ORAL
  Filled 2020-04-16: qty 4
  Filled 2020-04-16 (×2): qty 2
  Filled 2020-04-16 (×2): qty 4

## 2020-04-16 MED ORDER — KETOROLAC TROMETHAMINE 30 MG/ML IJ SOLN
30.0000 mg | Freq: Four times a day (QID) | INTRAMUSCULAR | Status: DC
  Administered 2020-04-16 – 2020-04-17 (×4): 30 mg via INTRAVENOUS
  Filled 2020-04-16 (×4): qty 1

## 2020-04-16 MED ORDER — LACTATED RINGERS IV SOLN: INTRAVENOUS | Status: DC

## 2020-04-16 MED ORDER — HYDROMORPHONE 1 MG/ML IV SOLN
INTRAVENOUS | Status: DC
Start: 2020-04-16 — End: 2020-04-16

## 2020-04-16 MED ORDER — HYDROMORPHONE HCL 2 MG/ML IJ SOLN
2.0000 mg | INTRAMUSCULAR | Status: DC | PRN
  Administered 2020-04-16 (×3): 2 mg via INTRAVENOUS
  Filled 2020-04-16 (×4): qty 1

## 2020-04-16 MED ORDER — ALBUTEROL SULFATE HFA 108 (90 BASE) MCG/ACT IN AERS
1.0000 | INHALATION_SPRAY | Freq: Four times a day (QID) | RESPIRATORY_TRACT | Status: DC | PRN
Start: 2020-04-16 — End: 2020-04-18
  Filled 2020-04-16: qty 6.7

## 2020-04-16 MED ORDER — BUPROPION HCL ER (XL) 150 MG PO TB24
150.0000 mg | ORAL_TABLET | Freq: Two times a day (BID) | ORAL | Status: DC
  Administered 2020-04-16 – 2020-04-17 (×4): 150 mg via ORAL
  Filled 2020-04-16 (×4): qty 1

## 2020-04-16 MED ORDER — LORAZEPAM 1 MG PO TABS
1.0000 mg | ORAL_TABLET | ORAL | Status: DC | PRN
  Administered 2020-04-16: 1 mg via ORAL
  Filled 2020-04-16: qty 1

## 2020-04-16 MED ORDER — HYDROMORPHONE HCL 1 MG/ML IJ SOLN
1.0000 mg | Freq: Once | INTRAMUSCULAR | Status: AC
  Administered 2020-04-16: 1 mg via INTRAVENOUS
  Filled 2020-04-16: qty 1

## 2020-04-16 MED ORDER — HYDROMORPHONE HCL 2 MG/ML IJ SOLN
2.0000 mg | INTRAMUSCULAR | Status: DC | PRN
  Administered 2020-04-16: 2 mg via INTRAVENOUS
  Filled 2020-04-16: qty 1

## 2020-04-16 MED ORDER — ACETAMINOPHEN 325 MG PO TABS: 650.0000 mg | ORAL_TABLET | Freq: Four times a day (QID) | ORAL | Status: DC | PRN

## 2020-04-16 MED ORDER — METHYLNALTREXONE BROMIDE 12 MG/0.6ML ~~LOC~~ SOLN
8.0000 mg | Freq: Once | SUBCUTANEOUS | Status: AC
  Administered 2020-04-16: 8 mg via SUBCUTANEOUS
  Filled 2020-04-16: qty 0.6

## 2020-04-16 MED ORDER — CHLORHEXIDINE GLUCONATE CLOTH 2 % EX PADS
6.0000 | MEDICATED_PAD | Freq: Every day | CUTANEOUS | Status: DC
  Administered 2020-04-16: 6 via TOPICAL

## 2020-04-16 MED ORDER — SENNA 8.6 MG PO TABS
2.0000 | ORAL_TABLET | Freq: Every day | ORAL | Status: DC
  Administered 2020-04-16: 17.2 mg via ORAL
  Filled 2020-04-16: qty 2

## 2020-04-16 MED ORDER — HYDROMORPHONE 1 MG/ML IV SOLN
INTRAVENOUS | Status: DC
Start: 2020-04-16 — End: 2020-04-18
  Administered 2020-04-16: 6.71 mg via INTRAVENOUS
  Administered 2020-04-16 – 2020-04-17 (×2): 30 mg via INTRAVENOUS
  Administered 2020-04-17: 5.7 mg via INTRAVENOUS
  Administered 2020-04-17: 5.58 mg via INTRAVENOUS
  Administered 2020-04-17: 0.73 mg via INTRAVENOUS
  Administered 2020-04-17: 4.72 mg via INTRAVENOUS
  Filled 2020-04-16 (×2): qty 30

## 2020-04-16 MED ORDER — POLYETHYLENE GLYCOL 3350 17 G PO PACK: 17.0000 g | PACK | Freq: Every day | ORAL | Status: DC

## 2020-04-16 MED ORDER — ACETAMINOPHEN 650 MG RE SUPP: 650.0000 mg | Freq: Four times a day (QID) | RECTAL | Status: DC | PRN

## 2020-04-16 MED ORDER — LORAZEPAM 0.5 MG PO TABS: 0.5000 mg | ORAL_TABLET | Freq: Every evening | ORAL | Status: DC | PRN

## 2020-04-16 MED ORDER — PANTOPRAZOLE SODIUM 40 MG PO TBEC
40.0000 mg | DELAYED_RELEASE_TABLET | Freq: Two times a day (BID) | ORAL | Status: DC
  Administered 2020-04-16 – 2020-04-17 (×4): 40 mg via ORAL
  Filled 2020-04-16 (×4): qty 1

## 2020-04-16 MED ORDER — BISACODYL 5 MG PO TBEC
10.0000 mg | DELAYED_RELEASE_TABLET | Freq: Every day | ORAL | Status: DC
  Administered 2020-04-16: 10 mg via ORAL
  Filled 2020-04-16: qty 2

## 2020-04-16 MED ORDER — DEXAMETHASONE SODIUM PHOSPHATE 4 MG/ML IJ SOLN
4.0000 mg | Freq: Once | INTRAMUSCULAR | Status: AC
  Administered 2020-04-16: 4 mg via INTRAVENOUS
  Filled 2020-04-16: qty 1

## 2020-04-16 MED ORDER — METHYLNALTREXONE BROMIDE 12 MG/0.6ML ~~LOC~~ SOLN: 12.0000 mg | Freq: Once | SUBCUTANEOUS | Status: DC

## 2020-04-16 MED ORDER — LACTATED RINGERS IV SOLN: INTRAVENOUS | Status: AC

## 2020-04-16 MED ORDER — DEXAMETHASONE 4 MG PO TABS
4.0000 mg | ORAL_TABLET | Freq: Two times a day (BID) | ORAL | Status: DC
  Administered 2020-04-16: 4 mg via ORAL
  Filled 2020-04-16: qty 1

## 2020-04-16 MED ORDER — HYDROMORPHONE 1 MG/ML IV SOLN: INTRAVENOUS | Status: DC

## 2020-04-16 MED ORDER — BISACODYL 10 MG RE SUPP: 10.0000 mg | Freq: Every day | RECTAL | Status: DC | PRN

## 2020-04-16 MED ORDER — DEXAMETHASONE 2 MG PO TABS: 2.0000 mg | ORAL_TABLET | Freq: Two times a day (BID) | ORAL | Status: DC

## 2020-04-16 MED ORDER — METHADONE HCL 5 MG PO TABS
25.0000 mg | ORAL_TABLET | Freq: Every day | ORAL | Status: DC
  Administered 2020-04-16: 25 mg via ORAL
  Filled 2020-04-16: qty 1

## 2020-04-16 MED ORDER — DIPHENHYDRAMINE HCL 12.5 MG/5ML PO ELIX: 12.5000 mg | ORAL_SOLUTION | Freq: Four times a day (QID) | ORAL | Status: DC | PRN

## 2020-04-16 MED ORDER — LIDOCAINE 5 % EX PTCH
2.0000 | MEDICATED_PATCH | CUTANEOUS | Status: DC
  Administered 2020-04-16: 2 via TRANSDERMAL
  Filled 2020-04-16 (×2): qty 2

## 2020-04-16 MED ORDER — SODIUM CHLORIDE 0.9% FLUSH
9.0000 mL | INTRAVENOUS | Status: DC | PRN
Start: 2020-04-16 — End: 2020-04-16

## 2020-04-16 NOTE — Progress Notes (Signed)
IR consulted by Dr. [name] for possible image-guided intercostal nerve block for management of intractable pain secondary to 10th rib pathological fracture.  Discussed case with Dr. Laurence Ferrari. Unfortunately, this procedure does not occur in IR on IP or OP basis. Recommend anesthesia consult for IP basis, possible pain center for OP basis. No plans for IR interventions at this time- will delete order. Dr. [name] made aware.  IR available in future if needed.   Laura Graff Caren Garske, PA-C 04/16/2020, 3:40 PM

## 2020-04-16 NOTE — Progress Notes (Signed)
PROGRESS NOTE    Laura Anthony  ZOX:[id number] DOB: Feb 11, 1975 DOA: 04/15/2020 PCP: [name], MD   Chief Complaint  Patient presents with  . Abdominal Pain  Brief Narrative: 45 year old female with metastatic breast cancer GERD anxiety disorder admitted with uncontrolled back and shoulder pain. Patient has a progressive worsening cancer-related pain was on PCA pump taken off 2 weeks ago for international travel.  Pain is getting worse since she is back from [place] travel. In the ED CT scan showed progressive metastatic disease in the multiple location goading evidence of progressive pulmonary metastatic disease a new left adrenal nodule, new hypodensities in the right and left lobes of the liver suspicious for metastatic disease and progression of osseous metastatic disease in the right eighth and 10th ribs with evidence of pathologic fracture in the 10th rib.  Patient is also noted to have progressive pathologic compression fracture of T3 as well as T5.  This progressive osseous metastatic disease is felt to be the culprit for the patient's worsening pains.  Patient several days of IV Dilaudid in the ED without relief and she was admitted for pain control  Subjective: Seen this morning received last dose of Dilaudid around 7 AM and was asking for repeat dose around 10 Alert awake, anxious. Assessment & Plan:  Cancer-related breakthrough pain due to progressively worsening pulmonary, osseous metastatic disease along with mets to liver, adrenal gland ribs Malignant neoplasm of the left breast with progressively worsening metastasis  Hospice care Goals of care Thrombocytopenia-platelet at 95K Anemia of cancer-hemoglobin at [facility] High opiate tolerance/chronic opiate use for cancer pain  Patient is admitted for pain control for ongoing severe pain.  She is currently in hospice has not seen oncology for a while.  Recently was on PCA pump and was taken off for her trip.   Continue methadone 20 mg twice daily, 25 nightly, IV Dilaudid as needed.  Palliative care consultation requested for going management pain and end-of-life/hospice care.  Continue Neurontin, Wellbutrin, Decadron.  Continue hydration with IV fluids.  Diet Order            Diet regular Room service appropriate? Yes; Fluid consistency: Thin  Diet effective now               Patient's There is no height or weight on file to calculate BMI.  DVT prophylaxis: SCDs Start: 04/16/20 0050 Code Status:   Code Status: DNR  Family Communication: plan of care discussed with patient at bedside.  Status is: Observation Patient will remain hospitalized for at least 2 midnight for ongoing management of cancer related pain, ongoing palliative care evaluation and for consideration of hospice inpatient versus outpatient  Dispo: The patient is from: Home              Anticipated d/c is to: TBD              Patient currently is not medically stable to d/c.   Difficult to place patient No   Unresulted Labs (From admission, onward)         None     Medications reviewed:  Scheduled Meds: . bisacodyl  10 mg Oral Daily  . buPROPion  150 mg Oral BID  . dexamethasone  4 mg Oral BID  . gabapentin  300 mg Oral BID  . HYDROmorphone   Intravenous Q4H  . methadone  20 mg Oral 2 times per day  . methadone  25 mg Oral QHS  . pantoprazole  40 mg  Oral BID AC  . polyethylene glycol  17 g Oral Daily  . senna  2 tablet Oral QHS   Continuous Infusions: . lactated ringers 125 mL/hr at 04/16/20 0134    Consultants:see note  Procedures:see note  Antimicrobials: Anti-infectives (From admission, onward)   None     Culture/Microbiology    Component Value Date/Time   SDES  07/22/2019 1900    IN/OUT CATH URINE Performed at [facility], [place] [address] [place], [place] [address]    SPECREQUEST  07/22/2019 1900    NONE Performed at [facility], [place] [address],  [place], [place] [address]    CULT  07/22/2019 1900    NO GROWTH Performed at [facility], [place] [address], [place], [place] [address]    REPTSTATUS 07/24/2019 FINAL 07/22/2019 1900    Other culture-see note  Objective: Vitals: Today's Vitals   04/16/20 0700 04/16/20 0745 04/16/20 0845 04/16/20 1019  BP: 115/82 117/90 117/90 123/84  Pulse: 90 91 91 97  Resp: 18 18 18 18   Temp:      TempSrc:      SpO2: 95% 95% 95% 91%  PainSc:       No intake or output data in the 24 hours ending 04/16/20 1148 There were no vitals filed for this visit. Weight change:   Intake/Output from previous day: No intake/output data recorded. Intake/Output this shift: No intake/output data recorded. There were no vitals filed for this visit.  Examination: General exam: Looks older than his stated age, weak appearing. HEENT:Oral mucosa moist, Ear/Nose WNL grossly,dentition normal. Respiratory system: bilaterally diminished,no use of accessory muscle, non tender. Cardiovascular system: S1 & S2 +, regular, No JVD. Gastrointestinal system: Abdomen soft, NT,ND, BS+. Nervous System:Alert, awake, moving extremities and grossly nonfocal Extremities: Tender to palpation on the posterior chest no edema, distal peripheral pulses palpable.  Skin: No rashes,no icterus. MSK: Normal muscle bulk,tone, power  Data Reviewed: I have personally reviewed following labs and imaging studies CBC: Recent Labs  Lab 04/15/20 1723 04/16/20 0617  WBC 9.1 7.8  NEUTROABS 7.5 6.3  HGB 8.4* 8.1*  HCT 27.4* 26.2*  MCV 91.3 91.3  PLT 109* 95*   Basic Metabolic Panel: Recent Labs  Lab 04/15/20 1723 04/16/20 0617  NA 137 134*  K 3.8 3.6  CL 99 98  CO2 25 25  GLUCOSE 106* 82  BUN 20 17  CREATININE 0.67 0.64  CALCIUM 9.0 8.5*   GFR: CrCl cannot be calculated (Unknown ideal weight.). Liver Function Tests: Recent Labs  Lab 04/15/20 1723 04/16/20 0617  AST 91* 87*  ALT 117* 103*  ALKPHOS 189* 166*  BILITOT  0.5 0.7  PROT 7.5 6.5  ALBUMIN 2.9* 2.6*   Recent Labs  Lab 04/15/20 1723  LIPASE 22   No results for input(s): AMMONIA in the last 168 hours. Coagulation Profile: No results for input(s): INR, PROTIME in the last 168 hours. Cardiac Enzymes: No results for input(s): CKTOTAL, CKMB, CKMBINDEX, TROPONINI in the last 168 hours. BNP (last 3 results) No results for input(s): PROBNP in the last 8760 hours. HbA1C: No results for input(s): HGBA1C in the last 72 hours. CBG: No results for input(s): GLUCAP in the last 168 hours. Lipid Profile: No results for input(s): CHOL, HDL, LDLCALC, TRIG, CHOLHDL, LDLDIRECT in the last 72 hours. Thyroid Function Tests: No results for input(s): TSH, T4TOTAL, FREET4, T3FREE, THYROIDAB in the last 72 hours. Anemia Panel: No results for input(s): VITAMINB12, FOLATE, FERRITIN, TIBC, IRON, RETICCTPCT  in the last 72 hours. Sepsis Labs: No results for input(s): PROCALCITON, LATICACIDVEN in the last 168 hours.  Recent Results (from the past 240 hour(s))  Resp Panel by RT-PCR (Flu A&B, Covid) Nasopharyngeal Swab     Status: None   Collection Time: 04/15/20  9:50 PM   Specimen: Nasopharyngeal Swab; Nasopharyngeal(NP) swabs in vial transport medium  Result Value Ref Range Status   SARS Coronavirus 2 by RT PCR NEGATIVE NEGATIVE Final    Comment: (NOTE) SARS-CoV-2 target nucleic acids are NOT DETECTED.  The SARS-CoV-2 RNA is generally detectable in upper respiratory specimens during the acute phase of infection. The lowest concentration of SARS-CoV-2 viral copies this assay can detect is 138 copies/mL. A negative result does not preclude SARS-Cov-2 infection and should not be used as the sole basis for treatment or other patient management decisions. A negative result may occur with  improper specimen collection/handling, submission of specimen other than nasopharyngeal swab, presence of viral mutation(s) within the areas targeted by this assay, and  inadequate number of viral copies(<138 copies/mL). A negative result must be combined with clinical observations, patient history, and epidemiological information. The expected result is Negative.  Fact Sheet for Patients:  EntrepreneurPulse.com.au  Fact Sheet for Healthcare Providers:  IncredibleEmployment.be  This test is no t yet approved or cleared by the [place] FDA and  has been authorized for detection and/or diagnosis of SARS-CoV-2 by FDA under an Emergency Use Authorization (EUA). This EUA will remain  in effect (meaning this test can be used) for the duration of the COVID-19 declaration under Section 564(b)(1) of the Act, 21 U.S.C.section 360bbb-3(b)(1), unless the authorization is terminated  or revoked sooner.       Influenza A by PCR NEGATIVE NEGATIVE Final   Influenza B by PCR NEGATIVE NEGATIVE Final    Comment: (NOTE) The Xpert Xpress SARS-CoV-2/FLU/RSV plus assay is intended as an aid in the diagnosis of influenza from Nasopharyngeal swab specimens and should not be used as a sole basis for treatment. Nasal washings and aspirates are unacceptable for Xpert Xpress SARS-CoV-2/FLU/RSV testing.  Fact Sheet for Patients: EntrepreneurPulse.com.au  Fact Sheet for Healthcare Providers: IncredibleEmployment.be  This test is not yet approved or cleared by the [place] FDA and has been authorized for detection and/or diagnosis of SARS-CoV-2 by FDA under an Emergency Use Authorization (EUA). This EUA will remain in effect (meaning this test can be used) for the duration of the COVID-19 declaration under Section 564(b)(1) of the Act, 21 U.S.C. section 360bbb-3(b)(1), unless the authorization is terminated or revoked.  Performed at [facility], [place] [address], [place], [place] [address]      Radiology Studies: CT CHEST ABDOMEN PELVIS W CONTRAST  Result Date:  04/15/2020 CLINICAL DATA:  Right rib and abdominal pain. History of metastatic breast cancer. EXAM: CT CHEST, ABDOMEN, AND PELVIS WITH CONTRAST TECHNIQUE: Multidetector CT imaging of the chest, abdomen and pelvis was performed following the standard protocol during bolus administration of intravenous contrast. CONTRAST:  168mL OMNIPAQUE IOHEXOL 300 MG/ML  SOLN COMPARISON:  Right upper quadrant earlier today. Chest CT 12/18/2019, most recent abdominal CT 10/22/2019 FINDINGS: CT CHEST FINDINGS Cardiovascular: Normal heart size. Thoracic aorta is normal in caliber. No acute aortic findings. Accessed right chest port with tip in the SVC. No pericardial effusion or thickening. Mediastinum/Nodes: 9 mm right hilar node versus central lung nodule, series 2, image 26. No mediastinal adenopathy. No esophageal wall thickening. Left axillary surgical clips. No axillary adenopathy. Lungs/Pleura: Progression in pulmonary  metastatic disease with increased size and number of innumerable pulmonary nodules throughout both lungs. Index nodule in the left upper lobe measures 2.2 x 2.2 cm, series 4, image 57, previously 1.7 x 1.5 cm. Index nodule in the right middle lobe measures 2.3 x 1.7 cm, series 4, image 85, previously 1.4 x 1.5 cm. Irregular pleural thickening in the left hemithorax has progressed, with increasing thickening along the inter lobar fissure. Again seen consolidation involving the lingula, similar. No frank pleural effusion. Musculoskeletal: New lucent lesion within posterior right tenth rib with possible pathologic fracture, series 4, image 111. Lucent lesion within posterolateral right eighth rib, series 4, image 80. Questionable small lucent lesion involving left posterior eighth rib, series 4, image 76. Increased size of medial right clavicular lucent lesion. Stable sternal body lesion with peripheral sclerosis. Thoracic spine is assessed on concurrent thoracic spine CT, however progressive thoracic osseous  metastatic disease. CT ABDOMEN PELVIS FINDINGS Hepatobiliary: 2 subcentimeter hypodensities in the right left lobe of the liver, both on series 2, image 56, not definitively seen on prior exam. Decompressed gallbladder. No biliary dilatation. Pancreas: No ductal dilatation or inflammation.  No pancreatic mass. Spleen: Normal in size without focal abnormality. Adrenals/Urinary Tract: New left adrenal nodule measuring 11 mm. Subcentimeter nodularity of the right adrenal gland. There are 2 tiny subcentimeter low-density lesions in the upper right kidney, series 2, image 55, upper left kidney series 2, image 56, with slightly larger lesions in the lower right kidney, too lesions on series 2, image 68. These were not seen on prior exam. Slight heterogeneous bilateral renal enhancement. There is no hydronephrosis. Urinary bladder is unremarkable. Stomach/Bowel: Ingested material within the stomach. No small bowel obstruction or inflammation, there is fecalization of distal small bowel contents. Large colonic stool burden involving the ascending, transverse, and proximal descending colon. There is transverse colonic tortuosity. No wall thickening or inflammation. Vascular/Lymphatic: Normal caliber abdominal aorta. Patent portal vein. Patent splenic vein. No definite enlarged lymph nodes in the abdomen or pelvis. Reproductive: The uterus is unremarkable. The ovaries are not well-defined on the current exam. Other: No ascites. No evidence of omental or peritoneal deposits are nodularity. Musculoskeletal: Lumbar spine assessed on concurrent lumbar reformats, reported separately. Many of the known lucent lesions throughout the bony pelvis have increased in size, for example lesion involving the left inferior ramus, no of cortical breakthrough, series 2, image 122. Central right acetabular lesion also has cortical breakthrough, series 2, image 112. Increasing lesions involving the right ischium and iliac bone, as well as the  sacrum. Increasing loosened involving the left ischium and iliac bone. IMPRESSION: 1. Progression in pulmonary metastatic disease with increased size and number of innumerable pulmonary nodules throughout both lungs. Irregular pleural thickening in the left hemithorax has progressed. 2. New left adrenal nodule measuring 11 mm, most consistent with metastatic disease. 3. New tiny subcentimeter hypodensities in the right and left lobe of the liver, not definitively seen on prior exam, suspicious for metastatic disease. 4. New small low-density lesions in both kidneys, also suspicious for metastatic disease, though indeterminate. 5. Large colonic stool burden with fecalization of distal small bowel contents, suggesting slow transit. No bowel obstruction or inflammation. 6. Progression in osseous metastatic disease. There are new right rib lesions involving the posterior eighth and tenth ribs, tenth rib fracture may have pathologic fracture in be cause of patient pain. 7. Thoracic and lumbar spine reported separately. Electronically Signed   By: Keith Rake M.D.   On: 04/15/2020 21:19  CT T-SPINE NO CHARGE  Result Date: 04/15/2020 CLINICAL DATA:  Back pain. Known osseous metastatic disease to bone. EXAM: CT THORACIC SPINE WITHOUT CONTRAST TECHNIQUE: Multidetector CT images of the thoracic were obtained using the standard protocol without intravenous contrast. COMPARISON:  Thoracic MRI 02/28/2020 FINDINGS: Alignment: Exaggerated kyphosis of the upper thoracic spine related to T3 compression fracture. Vertebrae: Known multifocal thoracic osseous metastatic disease. Pathologic compression fracture of T3 has progressed, now with greater than 75% loss of height anteriorly. Lucent lesion involves the posterior cortex. Difficult to assess for epidural component or canal compromise due to angulation. Pathologic moderate T5 compression fracture is new. There is buckling of the posterior cortex. Lesion involving  posterior left aspect of T10 vertebral body has extraosseous extension and mild mass effect on the canal. Paraspinal and other soft tissues: Multiple rib lesions. Pulmonary nodules assessed on concurrent chest CT, reported separately. Disc levels: Scattered degenerative disc disease. IMPRESSION: 1. Known multifocal thoracic osseous metastatic disease. 2. Pathologic compression fracture of T3 has progressed, now with greater than 75% loss of height anteriorly. Increased kyphosis at this level. Lucent lesion involves the posterior cortex. Difficult to assess for epidural component or canal compromise due to angulation. 3. Pathologic moderate T5 compression fracture is new. There is cortical buckling posteriorly but no canal compromise. 4. Lesion involving posterior left aspect of T10 vertebral body has extraosseous extension and mild mass effect on the canal. Electronically Signed   By: Keith Rake M.D.   On: 04/15/2020 21:30   CT L-SPINE NO CHARGE  Result Date: 04/15/2020 CLINICAL DATA:  Back pain. Breast cancer with known osseous metastatic disease. EXAM: CT LUMBAR SPINE WITHOUT CONTRAST TECHNIQUE: Multidetector CT imaging of the lumbar spine was performed without intravenous contrast administration. Multiplanar CT image reconstructions were also generated. COMPARISON:  MRI 02/28/2020 FINDINGS: Segmentation: 5 lumbar type vertebrae. Alignment: Normal. Vertebrae: Multifocal osseous metastatic disease. Lucent lesion involving L4 with mild central endplate depression of superior and inferior endplate, new. No bony retropulsion. L2 vertebral body lucent lesion is similar. L3 lesion involves the right transverse process and pedicle. There is also an L3 vertebral body that approaches the posterior cortex. No convincing epidural extension. L3 also is a spinous process lesion. Paraspinal and other soft tissues: Subcentimeter subcutaneous lesion in the right gluteal fat, series 1, image 39. Disc levels: L3 lesion  approaches the posterior cortex but no definite epidural component or canal compromise. IMPRESSION: 1. [place] multifocal osseous metastatic disease. Lucent lesion involving L4 with mild central endplate depression of superior and inferior endplate, new from prior MRI. No bony retropulsion. 2. L3 lesion approaches the posterior cortex but no definite epidural component or canal compromise. Electronically Signed   By: Keith Rake M.D.   On: 04/15/2020 21:24   US Abdomen Limited RUQ (LIVER/GB)  Result Date: 04/15/2020 CLINICAL DATA:  Right upper quadrant pain. History of metastatic breast cancer. EXAM: ULTRASOUND ABDOMEN LIMITED RIGHT UPPER QUADRANT COMPARISON:  None. FINDINGS: Gallbladder: The gallbladder is partially contracted. No gallstones or wall thickening visualized (2.2 mm). No sonographic Murphy sign noted by sonographer. Common bile duct: Diameter: 4.7 mm Liver: Limited in evaluation secondary to overlying bowel gas. No focal lesion identified. Within normal limits in parenchymal echogenicity. Portal vein is patent on color Doppler imaging with normal direction of blood flow towards the liver. Other: None. IMPRESSION: 1. Limited study, as described above, without evidence of cholelithiasis or acute cholecystitis. Electronically Signed   By: Virgina Norfolk M.D.   On: 04/15/2020 18:11  LOS: 0 days   Antonieta Pert, MD [facility]  04/16/2020, 11:48 AM

## 2020-04-16 NOTE — Consult Note (Addendum)
Palliative Care Consult Note  Laura Anthony is a 45 yo woman with end-stage widely metastatic breast cancer, enrolled in hospice services with [facility]. [name] is well known to me from the oncology clinic where I have seen her as an outpatient and also during previous admissions. [name] reports excellent management and assistance from her hospice team at home, but despite multiple attempts to achieve pain control and unclear etiology from acutely worsening pain was sent to the hospital for further evaluation for reversible etiology and pain management. Imagaing has revealed worsening metastatic disease and a new 10th rib pathological fracture and possible thoracic vertebra pathological fracture with worsening mets.  [name] was in obvious discomfort upon evaluation. She was slightly disoriented from high doses of medication but able to wake up and reorient her self easily. Lungs are clear. Tenderness along her chest wall. Her abdomen is distended and slightly tender. Mild anasarca and diffuse non-pitting edema.  She has just returned from a family vacation in the [place] the knowledge that it was likely her last. As much as she can be, [name] is at peace with her impending death and is emphatic that her goal is to get relief and get back home as soon as possible- to live fully, and if possible to be lucid until the end of her life.  We discussed her goals of care and end-of-life planning.  Recommendations:  1. Continue her current dose of methadone-this new dose was recently started so she is not to a steady state yet.   2. Will resume her hydromorphone PCA- this was stopped during her vacation. Initiating PCA at 0.5mg  basal rate, 1mg  q47min bolus prn. Will plan on continuing PCA at discharge.  3. I have contacted IR to see if we can get an intercostal block done-but they do not do these. Applied 2 lidoderm patches to the 10th rib area.  4. Started Decadron 10mg  IV  daily  5. Toradol X1, will order additional PRN doses for inflammatory pain relief.  6. She has significant stool burden on imaging. Will start a more aggressive bowel regimen and give her a dose of Relistor X1.  Lane Hacker, DO Palliative Medicine   Discussed care with RN and Hospice RN.  Time: 70 min Greater than 50%  of this time was spent counseling and coordinating care related to the above assessment and plan.

## 2020-04-17 ENCOUNTER — Other Ambulatory Visit: Payer: Self-pay

## 2020-04-17 MED ORDER — LIDOCAINE 5 % EX PTCH
3.0000 | MEDICATED_PATCH | CUTANEOUS | Status: DC
  Administered 2020-04-17: 3 via TRANSDERMAL
  Filled 2020-04-17: qty 3

## 2020-04-17 MED ORDER — METOCLOPRAMIDE HCL 5 MG/ML IJ SOLN
10.0000 mg | Freq: Once | INTRAMUSCULAR | Status: AC
  Administered 2020-04-17: 10 mg via INTRAVENOUS
  Filled 2020-04-17: qty 2

## 2020-04-17 MED ORDER — KETOROLAC TROMETHAMINE 10 MG PO TABS: ORAL_TABLET | ORAL | 0 refills | Status: AC

## 2020-04-17 MED ORDER — METHYLNALTREXONE BROMIDE 12 MG/0.6ML ~~LOC~~ SOLN: 8.0000 mg | SUBCUTANEOUS | 0 refills | Status: AC

## 2020-04-17 MED ORDER — METHYLNALTREXONE BROMIDE 12 MG/0.6ML ~~LOC~~ SOLN
8.0000 mg | SUBCUTANEOUS | Status: AC
  Administered 2020-04-17: 8 mg via SUBCUTANEOUS
  Filled 2020-04-17: qty 0.6

## 2020-04-17 NOTE — Progress Notes (Signed)
Palliative Care Progress Note  Rilley is much improved today. She is able to take a better breath but is still having pain in her left upper scapula, in her back and chest wall-but at a tolerable level. She has a fullness feeling in her abdomen and generalized discomfort. On exam it does not appear to be from ascites although her albumin is low at 2.6 and she has liver involvement-I think she continues to have a large stool burden. She had a large BM last PM, I suspect she has more to go after review of imaging. She tolerated the Relistor well, but is intolerant to Movantik which I prescribed in the outpatient setting which seems to have precipitated withdrawal symptoms. Marleen also thinks that the Toradol helped significantly.  Recommendations:  1. Discharge home today with [facility]  Continuing to follow. I have discussed a plan with them and they are working on getting CADD pump delivered to her home.   Leave port accessed at time of d/c  Call hospice RN when leaving the hospital  Hospice will hook up her PCA when she arrives at home, she can give herself a bolus just before it is disconnected.  Given Toradol prior to leaving the hospital  2. Changes to her pain regimen. We discussed risk benefit of using Toradol- likely she was having acute inflammatory pain from the fracture so 5 days of scheduled Toradol would be reasonable and the PRN use after that. She is on GI protection. Script sent to her pharmacy.  3. She responded well to the Relistor-I have sent an outpatient script for this as an injectable-she is increasingly intolerant of multiple liquid osmotic laxatives, pills and suppositories-one injection every other day is a QOL issue for her and it worked within 6 hours of administration.  4. Added additional lidoderm patch to her left scapula-she can change these daily.   5. She could possibly benefit greatly from an intercostal block or interventional pain support. She  is established with Dr. Davy Pique with [facility]. I will contact his office and see if he will review her imaging and make recommendations.  Lane Hacker, DO Palliative Medicine  Time: 35 min Greater than 50%  of this time was spent counseling and coordinating care related to the above assessment and plan.

## 2020-04-17 NOTE — Discharge Summary (Signed)
Physician Discharge Summary  Laura Anthony WUJ:[id number] DOB: [birth year] DOA: 04/15/2020  PCP: [name], MD  Admit date: 04/15/2020 Discharge date: 04/17/2020  Admitted From: hom Disposition:  home  Recommendations for Outpatient Follow-up:  1. Follow up with PCP in 1-2 weeks 2. Please obtain BMP/CBC in one week 3. Please follow up on the following pending results:  Home Health:no  Equipment/Devices: pca  Discharge Condition: Stable Code Status:   Code Status: DNR Diet recommendation:  Diet Order            Diet regular Room service appropriate? Yes; Fluid consistency: Thin  Diet effective now                 Brief/Interim Summary: 45 year old female with metastatic breast cancer GERD anxiety disorder admitted with uncontrolled back and shoulder pain. Patient has a progressive worsening cancer-related pain was on PCA pump taken off 2 weeks ago for international travel.  Pain is getting worse since she is back from [place] travel. In the ED CT scan showed progressive metastatic disease in the multiple location goading evidence of progressive pulmonary metastatic disease a new left adrenal nodule, new hypodensities in the right and left lobes of the liver suspicious for metastatic disease and progression of osseous metastatic disease in the right eighth and 10th ribs with evidence of pathologic fracture in the 10th rib. Patient is also noted to have progressive pathologic compression fracture of T3 as well as T5. This progressive osseous metastatic disease is felt to be the culprit for the patient's worsening pains.  Patient several days of IV Dilaudid in the ED without relief and she was admitted for pain control Patient was seen by Dr. [name] who knows her very well patient was placed back on PCA pump Pain is now well controlled.  Patient will be discharged home with hospice on PCA pump  Discharge Diagnoses:  Cancer-related breakthrough pain due to  progressively worsening pulmonary, osseous metastatic disease along with mets to liver, adrenal gland ribs Malignant neoplasm of the left breast with progressively worsening metastasis  Hospice care Goals of care Thrombocytopenia Anemia of cancer High opiate tolerance/chronic opiate use for cancer pain Patient is admitted for pain control also hydrated seen by palliative care (palliative Dilaudid PCA whichwas stopped for vacation Patient also received Toradol, Decadron IV, IR was consulted for intercostal block but not done by IR, Lidoderm patch is applied. Pain is now controlled well, patient wants to go home. PCA pump is being set up by hospice prior to discharge and she will return home with hospice. Dr. [name] has adjusted the pain medication further with Toradol w/ gi protection PPI, lidocaine, laxatives and will also follow-up re: with intercostal block or interventional pain support with Dr. Davy Pique  Consults:  palliative  Subjective:  AAOx3, pain is controlled on pca pump and toradol. Wants to go home today.  Discharge Exam: Vitals:   04/17/20 0612 04/17/20 1252  BP:    Pulse:    Resp: 16 16  Temp:    SpO2:  96%   General: Pt is alert, awake, not in acute distress Cardiovascular: RRR, S1/S2 +, no rubs, no gallops Respiratory: CTA bilaterally, no wheezing, no rhonchi Abdominal: Soft, NT, ND, bowel sounds + Extremities: no edema, no cyanosis  Discharge Instructions  Discharge Instructions    Discharge instructions   Complete by: As directed    Follow-up with hospice at home   Increase activity slowly   Complete by: As directed  Allergies as of 04/17/2020      Reactions   Denosumab Other (See Comments)   Osteonecrosis of the jaw   Statins Other (See Comments)   Leg pain   Sumatriptan Other (See Comments)   Numbness to face   Tape Rash   Rash from dressing over port-a-cath      Medication List    STOP taking these medications   HYDROmorphone 4 MG  tablet Commonly known as: DILAUDID     TAKE these medications   albuterol 108 (90 Base) MCG/ACT inhaler Commonly known as: VENTOLIN HFA Inhale 1-2 puffs into the lungs every 6 (six) hours as needed for wheezing or shortness of breath.   bisacodyl 5 MG EC tablet Commonly known as: DULCOLAX Take 10 mg by mouth daily.   buPROPion 150 MG 24 hr tablet Commonly known as: WELLBUTRIN XL Take 150 mg by mouth in the morning and at bedtime.   dexamethasone 2 MG tablet Commonly known as: DECADRON Take 2 mg by mouth 2 (two) times daily.   gabapentin 300 MG capsule Commonly known as: NEURONTIN 2 capsules twice daily What changed:   how much to take  how to take this  when to take this  additional instructions   ketorolac 10 MG tablet Commonly known as: TORADOL Take one tablet every 6 hours for 5 days, then use as needed every 6 hours for back and rib pain   LORazepam 0.5 MG tablet Commonly known as: ATIVAN Take 1 tablet (0.5 mg total) by mouth every 6 (six) hours as needed for anxiety (nausea). What changed: when to take this   methadone 10 MG tablet Commonly known as: DOLOPHINE Take 20-25 mg by mouth See admin instructions. 20 Mg In the morning and after lunch then 25 MG in the evening   methylnaltrexone 12 MG/0.6ML Soln injection Commonly known as: RELISTOR Inject 0.4 mLs (8 mg total) into the skin every other day for 15 doses.   Narcan 4 MG/0.1ML Liqd nasal spray kit Generic drug: naloxone Spray once in one nostril prn overdose, may repeat x 1   omeprazole 20 MG capsule Commonly known as: PRILOSEC Take 20 mg by mouth 2 (two) times daily.   senna 8.6 MG tablet Commonly known as: SENOKOT Take 2 tablets by mouth at bedtime.       Follow-up Information    [name], MD Follow up in 1 week(s).   Specialty: Family Medicine Contact information: [address] [place] [address] [phone]              Allergies  Allergen Reactions  .  Denosumab Other (See Comments)    Osteonecrosis of the jaw  . Statins Other (See Comments)    Leg pain  . Sumatriptan Other (See Comments)    Numbness to face   . Tape Rash    Rash from dressing over port-a-cath     The results of significant diagnostics from this hospitalization (including imaging, microbiology, ancillary and laboratory) are listed below for reference.    Microbiology: Recent Results (from the past 240 hour(s))  Resp Panel by RT-PCR (Flu A&B, Covid) Nasopharyngeal Swab     Status: None   Collection Time: 04/15/20  9:50 PM   Specimen: Nasopharyngeal Swab; Nasopharyngeal(NP) swabs in vial transport medium  Result Value Ref Range Status   SARS Coronavirus 2 by RT PCR NEGATIVE NEGATIVE Final    Comment: (NOTE) SARS-CoV-2 target nucleic acids are NOT DETECTED.  The SARS-CoV-2 RNA is generally detectable  in upper respiratory specimens during the acute phase of infection. The lowest concentration of SARS-CoV-2 viral copies this assay can detect is 138 copies/mL. A negative result does not preclude SARS-Cov-2 infection and should not be used as the sole basis for treatment or other patient management decisions. A negative result may occur with  improper specimen collection/handling, submission of specimen other than nasopharyngeal swab, presence of viral mutation(s) within the areas targeted by this assay, and inadequate number of viral copies(<138 copies/mL). A negative result must be combined with clinical observations, patient history, and epidemiological information. The expected result is Negative.  Fact Sheet for Patients:  EntrepreneurPulse.com.au  Fact Sheet for Healthcare Providers:  IncredibleEmployment.be  This test is no t yet approved or cleared by the [place] FDA and  has been authorized for detection and/or diagnosis of SARS-CoV-2 by FDA under an Emergency Use Authorization (EUA). This EUA will remain  in  effect (meaning this test can be used) for the duration of the COVID-19 declaration under Section 564(b)(1) of the Act, 21 U.S.C.section 360bbb-3(b)(1), unless the authorization is terminated  or revoked sooner.       Influenza A by PCR NEGATIVE NEGATIVE Final   Influenza B by PCR NEGATIVE NEGATIVE Final    Comment: (NOTE) The Xpert Xpress SARS-CoV-2/FLU/RSV plus assay is intended as an aid in the diagnosis of influenza from Nasopharyngeal swab specimens and should not be used as a sole basis for treatment. Nasal washings and aspirates are unacceptable for Xpert Xpress SARS-CoV-2/FLU/RSV testing.  Fact Sheet for Patients: EntrepreneurPulse.com.au  Fact Sheet for Healthcare Providers: IncredibleEmployment.be  This test is not yet approved or cleared by the [place] FDA and has been authorized for detection and/or diagnosis of SARS-CoV-2 by FDA under an Emergency Use Authorization (EUA). This EUA will remain in effect (meaning this test can be used) for the duration of the COVID-19 declaration under Section 564(b)(1) of the Act, 21 U.S.C. section 360bbb-3(b)(1), unless the authorization is terminated or revoked.  Performed at [facility], [place] [address] [place], [place] [address]     Procedures/Studies: CT CHEST ABDOMEN PELVIS W CONTRAST  Result Date: 04/15/2020 CLINICAL DATA:  Right rib and abdominal pain. History of metastatic breast cancer. EXAM: CT CHEST, ABDOMEN, AND PELVIS WITH CONTRAST TECHNIQUE: Multidetector CT imaging of the chest, abdomen and pelvis was performed following the standard protocol during bolus administration of intravenous contrast. CONTRAST:  146m OMNIPAQUE IOHEXOL 300 MG/ML  SOLN COMPARISON:  Right upper quadrant earlier today. Chest CT 12/18/2019, most recent abdominal CT 10/22/2019 FINDINGS: CT CHEST FINDINGS Cardiovascular: Normal heart size. Thoracic aorta is normal in caliber. No acute  aortic findings. Accessed right chest port with tip in the SVC. No pericardial effusion or thickening. Mediastinum/Nodes: 9 mm right hilar node versus central lung nodule, series 2, image 26. No mediastinal adenopathy. No esophageal wall thickening. Left axillary surgical clips. No axillary adenopathy. Lungs/Pleura: Progression in pulmonary metastatic disease with increased size and number of innumerable pulmonary nodules throughout both lungs. Index nodule in the left upper lobe measures 2.2 x 2.2 cm, series 4, image 57, previously 1.7 x 1.5 cm. Index nodule in the right middle lobe measures 2.3 x 1.7 cm, series 4, image 85, previously 1.4 x 1.5 cm. Irregular pleural thickening in the left hemithorax has progressed, with increasing thickening along the inter lobar fissure. Again seen consolidation involving the lingula, similar. No frank pleural effusion. Musculoskeletal: New lucent lesion within posterior right tenth rib with possible pathologic fracture, series 4,  image 111. Lucent lesion within posterolateral right eighth rib, series 4, image 80. Questionable small lucent lesion involving left posterior eighth rib, series 4, image 76. Increased size of medial right clavicular lucent lesion. Stable sternal body lesion with peripheral sclerosis. Thoracic spine is assessed on concurrent thoracic spine CT, however progressive thoracic osseous metastatic disease. CT ABDOMEN PELVIS FINDINGS Hepatobiliary: 2 subcentimeter hypodensities in the right left lobe of the liver, both on series 2, image 56, not definitively seen on prior exam. Decompressed gallbladder. No biliary dilatation. Pancreas: No ductal dilatation or inflammation.  No pancreatic mass. Spleen: Normal in size without focal abnormality. Adrenals/Urinary Tract: New left adrenal nodule measuring 11 mm. Subcentimeter nodularity of the right adrenal gland. There are 2 tiny subcentimeter low-density lesions in the upper right kidney, series 2, image 55, upper  left kidney series 2, image 56, with slightly larger lesions in the lower right kidney, too lesions on series 2, image 68. These were not seen on prior exam. Slight heterogeneous bilateral renal enhancement. There is no hydronephrosis. Urinary bladder is unremarkable. Stomach/Bowel: Ingested material within the stomach. No small bowel obstruction or inflammation, there is fecalization of distal small bowel contents. Large colonic stool burden involving the ascending, transverse, and proximal descending colon. There is transverse colonic tortuosity. No wall thickening or inflammation. Vascular/Lymphatic: Normal caliber abdominal aorta. Patent portal vein. Patent splenic vein. No definite enlarged lymph nodes in the abdomen or pelvis. Reproductive: The uterus is unremarkable. The ovaries are not well-defined on the current exam. Other: No ascites. No evidence of omental or peritoneal deposits are nodularity. Musculoskeletal: Lumbar spine assessed on concurrent lumbar reformats, reported separately. Many of the known lucent lesions throughout the bony pelvis have increased in size, for example lesion involving the left inferior ramus, no of cortical breakthrough, series 2, image 122. Central right acetabular lesion also has cortical breakthrough, series 2, image 112. Increasing lesions involving the right ischium and iliac bone, as well as the sacrum. Increasing loosened involving the left ischium and iliac bone. IMPRESSION: 1. Progression in pulmonary metastatic disease with increased size and number of innumerable pulmonary nodules throughout both lungs. Irregular pleural thickening in the left hemithorax has progressed. 2. New left adrenal nodule measuring 11 mm, most consistent with metastatic disease. 3. New tiny subcentimeter hypodensities in the right and left lobe of the liver, not definitively seen on prior exam, suspicious for metastatic disease. 4. New small low-density lesions in both kidneys, also  suspicious for metastatic disease, though indeterminate. 5. Large colonic stool burden with fecalization of distal small bowel contents, suggesting slow transit. No bowel obstruction or inflammation. 6. Progression in osseous metastatic disease. There are new right rib lesions involving the posterior eighth and tenth ribs, tenth rib fracture may have pathologic fracture in be cause of patient pain. 7. Thoracic and lumbar spine reported separately. Electronically Signed   By: Keith Rake M.D.   On: 04/15/2020 21:19   CT T-SPINE NO CHARGE  Result Date: 04/15/2020 CLINICAL DATA:  Back pain. Known osseous metastatic disease to bone. EXAM: CT THORACIC SPINE WITHOUT CONTRAST TECHNIQUE: Multidetector CT images of the thoracic were obtained using the standard protocol without intravenous contrast. COMPARISON:  Thoracic MRI 02/28/2020 FINDINGS: Alignment: Exaggerated kyphosis of the upper thoracic spine related to T3 compression fracture. Vertebrae: Known multifocal thoracic osseous metastatic disease. Pathologic compression fracture of T3 has progressed, now with greater than 75% loss of height anteriorly. Lucent lesion involves the posterior cortex. Difficult to assess for epidural component or canal compromise  due to angulation. Pathologic moderate T5 compression fracture is new. There is buckling of the posterior cortex. Lesion involving posterior left aspect of T10 vertebral body has extraosseous extension and mild mass effect on the canal. Paraspinal and other soft tissues: Multiple rib lesions. Pulmonary nodules assessed on concurrent chest CT, reported separately. Disc levels: Scattered degenerative disc disease. IMPRESSION: 1. Known multifocal thoracic osseous metastatic disease. 2. Pathologic compression fracture of T3 has progressed, now with greater than 75% loss of height anteriorly. Increased kyphosis at this level. Lucent lesion involves the posterior cortex. Difficult to assess for epidural component  or canal compromise due to angulation. 3. Pathologic moderate T5 compression fracture is new. There is cortical buckling posteriorly but no canal compromise. 4. Lesion involving posterior left aspect of T10 vertebral body has extraosseous extension and mild mass effect on the canal. Electronically Signed   By: Keith Rake M.D.   On: 04/15/2020 21:30   CT L-SPINE NO CHARGE  Result Date: 04/15/2020 CLINICAL DATA:  Back pain. Breast cancer with known osseous metastatic disease. EXAM: CT LUMBAR SPINE WITHOUT CONTRAST TECHNIQUE: Multidetector CT imaging of the lumbar spine was performed without intravenous contrast administration. Multiplanar CT image reconstructions were also generated. COMPARISON:  MRI 02/28/2020 FINDINGS: Segmentation: 5 lumbar type vertebrae. Alignment: Normal. Vertebrae: Multifocal osseous metastatic disease. Lucent lesion involving L4 with mild central endplate depression of superior and inferior endplate, new. No bony retropulsion. L2 vertebral body lucent lesion is similar. L3 lesion involves the right transverse process and pedicle. There is also an L3 vertebral body that approaches the posterior cortex. No convincing epidural extension. L3 also is a spinous process lesion. Paraspinal and other soft tissues: Subcentimeter subcutaneous lesion in the right gluteal fat, series 1, image 39. Disc levels: L3 lesion approaches the posterior cortex but no definite epidural component or canal compromise. IMPRESSION: 1. Knwn multifocal osseous metastatic disease. Lucent lesion involving L4 with mild central endplate depression of superior and inferior endplate, new from prior MRI. No bony retropulsion. 2. L3 lesion approaches the posterior cortex but no definite epidural component or canal compromise. Electronically Signed   By: Keith Rake M.D.   On: 04/15/2020 21:24   US Abdomen Limited RUQ (LIVER/GB)  Result Date: 04/15/2020 CLINICAL DATA:  Right upper quadrant pain. History of  metastatic breast cancer. EXAM: ULTRASOUND ABDOMEN LIMITED RIGHT UPPER QUADRANT COMPARISON:  None. FINDINGS: Gallbladder: The gallbladder is partially contracted. No gallstones or wall thickening visualized (2.2 mm). No sonographic Murphy sign noted by sonographer. Common bile duct: Diameter: 4.7 mm Liver: Limited in evaluation secondary to overlying bowel gas. No focal lesion identified. Within normal limits in parenchymal echogenicity. Portal vein is patent on color Doppler imaging with normal direction of blood flow towards the liver. Other: None. IMPRESSION: 1. Limited study, as described above, without evidence of cholelithiasis or acute cholecystitis. Electronically Signed   By: Virgina Norfolk M.D.   On: 04/15/2020 18:11    Labs: BNP (last 3 results) Recent Labs    07/22/19 1452  BNP 4.7   Basic Metabolic Panel: Recent Labs  Lab 04/15/20 1723 04/16/20 0617  NA 137 134*  K 3.8 3.6  CL 99 98  CO2 25 25  GLUCOSE 106* 82  BUN 20 17  CREATININE 0.67 0.64  CALCIUM 9.0 8.5*   Liver Function Tests: Recent Labs  Lab 04/15/20 1723 04/16/20 0617  AST 91* 87*  ALT 117* 103*  ALKPHOS 189* 166*  BILITOT 0.5 0.7  PROT 7.5 6.5  ALBUMIN 2.9* 2.6*  Recent Labs  Lab 04/15/20 1723  LIPASE 22   No results for input(s): AMMONIA in the last 168 hours. CBC: Recent Labs  Lab 04/15/20 1723 04/16/20 0617  WBC 9.1 7.8  NEUTROABS 7.5 6.3  HGB 8.4* 8.1*  HCT 27.4* 26.2*  MCV 91.3 91.3  PLT 109* 95*   Cardiac Enzymes: No results for input(s): CKTOTAL, CKMB, CKMBINDEX, TROPONINI in the last 168 hours. BNP: Invalid input(s): POCBNP CBG: No results for input(s): GLUCAP in the last 168 hours. D-Dimer No results for input(s): DDIMER in the last 72 hours. Hgb A1c No results for input(s): HGBA1C in the last 72 hours. Lipid Profile No results for input(s): CHOL, HDL, LDLCALC, TRIG, CHOLHDL, LDLDIRECT in the last 72 hours. Thyroid function studies No results for input(s): TSH,  T4TOTAL, T3FREE, THYROIDAB in the last 72 hours.  Invalid input(s): FREET3 Anemia work up No results for input(s): VITAMINB12, FOLATE, FERRITIN, TIBC, IRON, RETICCTPCT in the last 72 hours. Urinalysis    Component Value Date/Time   COLORURINE YELLOW 07/22/2019 1900   APPEARANCEUR CLEAR 07/22/2019 1900   LABSPEC 1.010 07/22/2019 1900   PHURINE 5.0 07/22/2019 1900   GLUCOSEU NEGATIVE 07/22/2019 1900   HGBUR NEGATIVE 07/22/2019 1900   BILIRUBINUR NEGATIVE 07/22/2019 1900   KETONESUR NEGATIVE 07/22/2019 1900   PROTEINUR NEGATIVE 07/22/2019 1900   NITRITE NEGATIVE 07/22/2019 1900   LEUKOCYTESUR NEGATIVE 07/22/2019 1900   Sepsis Labs Invalid input(s): PROCALCITONIN,  WBC,  LACTICIDVEN Microbiology Recent Results (from the past 240 hour(s))  Resp Panel by RT-PCR (Flu A&B, Covid) Nasopharyngeal Swab     Status: None   Collection Time: 04/15/20  9:50 PM   Specimen: Nasopharyngeal Swab; Nasopharyngeal(NP) swabs in vial transport medium  Result Value Ref Range Status   SARS Coronavirus 2 by RT PCR NEGATIVE NEGATIVE Final    Comment: (NOTE) SARS-CoV-2 target nucleic acids are NOT DETECTED.  The SARS-CoV-2 RNA is generally detectable in upper respiratory specimens during the acute phase of infection. The lowest concentration of SARS-CoV-2 viral copies this assay can detect is 138 copies/mL. A negative result does not preclude SARS-Cov-2 infection and should not be used as the sole basis for treatment or other patient management decisions. A negative result may occur with  improper specimen collection/handling, submission of specimen other than nasopharyngeal swab, presence of viral mutation(s) within the areas targeted by this assay, and inadequate number of viral copies(<138 copies/mL). A negative result must be combined with clinical observations, patient history, and epidemiological information. The expected result is Negative.  Fact Sheet for Patients:   EntrepreneurPulse.com.au  Fact Sheet for Healthcare Providers:  IncredibleEmployment.be  This test is no t yet approved or cleared by the [place] FDA and  has been authorized for detection and/or diagnosis of SARS-CoV-2 by FDA under an Emergency Use Authorization (EUA). This EUA will remain  in effect (meaning this test can be used) for the duration of the COVID-19 declaration under Section 564(b)(1) of the Act, 21 U.S.C.section 360bbb-3(b)(1), unless the authorization is terminated  or revoked sooner.       Influenza A by PCR NEGATIVE NEGATIVE Final   Influenza B by PCR NEGATIVE NEGATIVE Final    Comment: (NOTE) The Xpert Xpress SARS-CoV-2/FLU/RSV plus assay is intended as an aid in the diagnosis of influenza from Nasopharyngeal swab specimens and should not be used as a sole basis for treatment. Nasal washings and aspirates are unacceptable for Xpert Xpress SARS-CoV-2/FLU/RSV testing.  Fact Sheet for Patients: EntrepreneurPulse.com.au  Fact Sheet for Healthcare Providers: IncredibleEmployment.be  This test is not yet approved or cleared by the [place] and has been authorized for detection and/or diagnosis of SARS-CoV-2 by FDA under an Emergency Use Authorization (EUA). This EUA will remain in effect (meaning this test can be used) for the duration of the COVID-19 declaration under Section 564(b)(1) of the Act, 21 U.S.C. section 360bbb-3(b)(1), unless the authorization is terminated or revoked.  Performed at [facility], [place] [address], [place],  [address]      Time coordinating discharge: 35  minutes  SIGNED: Antonieta Pert, MD  [facility] 04/17/2020, 1:17 PM  If 7PM-7AM, please contact night-coverage [email or web address]

## 2020-04-17 NOTE — Progress Notes (Signed)
Discharge instructions given to patient.  No immediate questions or concerns. Scripts sent to pharmacy of choice. Pt awaiting CADD pump to be delivered and then patient to discharge home.

## 2020-04-17 NOTE — TOC Transition Note (Signed)
Transition of Care [facility]) - CM/SW Discharge Note   Patient Details  Name: Laura Anthony MRN: [id number] Date of Birth: 07-30-1975  [facility] CM/SW Contact:  Lynnell Catalan, RN Phone Number: 04/17/2020, 1:49 PM   Clinical Narrative:     Pt was at home with services from [place]. To dc back home with their services today. Per [facility] liaison they will be meeting pt and husband at home when she discharges to hook up a home CADD pump. Hospital RN to bolus pt with pain medicine prior to dc.         Readmission Risk Interventions Readmission Risk Prevention Plan 01/04/2019  Transportation Screening Complete  PCP or Specialist Appt within 3-5 Days Complete  HRI or [place] Not Complete  HRI or Home Care Consult comments NA  Social Work Consult for [place] Planning/Counseling Not Complete  SW consult not completed comments NA  Palliative Care Screening Not Applicable  Medication Review Press photographer) Complete  Some recent data might be hidden

## 2020-04-24 ENCOUNTER — Other Ambulatory Visit (HOSPITAL_COMMUNITY): Payer: Self-pay

## 2020-05-07 ENCOUNTER — Other Ambulatory Visit (HOSPITAL_COMMUNITY): Payer: Self-pay

## 2020-06-20 ENCOUNTER — Other Ambulatory Visit: Payer: Self-pay | Admitting: Oncology

## 2020-07-01 DEATH — deceased

## 2021-12-22 IMAGING — MR MR HEAD WO/W CM
14 of 16 series · 40 of 48 positions shown · IV contrast (Gadavist)
Comparison: 08/02/2018

CLINICAL DATA: Imbalance with frequent falls for a couple of weeks.
History of breast cancer.

EXAM:
MRI HEAD WITHOUT AND WITH CONTRAST
TECHNIQUE: Multiplanar, multiecho pulse sequences of the brain and surrounding
structures were obtained without and with intravenous contrast.
CONTRAST:  7mL GADAVIST GADOBUTROL 1 MMOL/ML IV SOLN

[Series 5: DWI · axial · 3.0mm · 0.88mm/px · z∈[-101,+33]mm · 6 of 92 slices shown (1 of 4)]
[im 1/92]
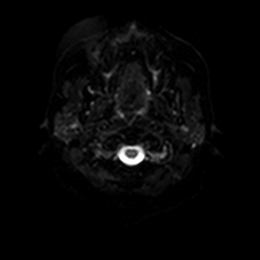
[im 19/92]
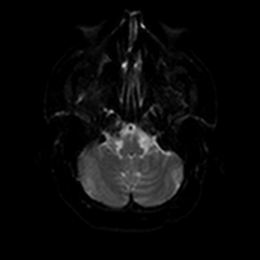
[im 37/92]
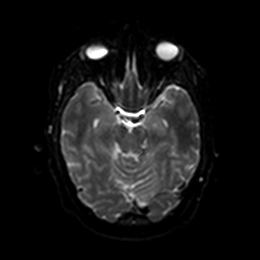
[im 55/92]
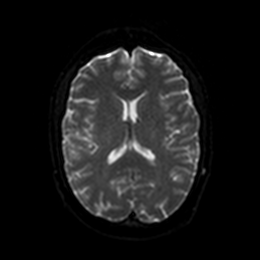
[im 73/92]
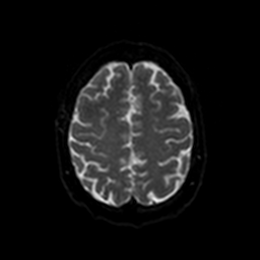
[im 92/92]
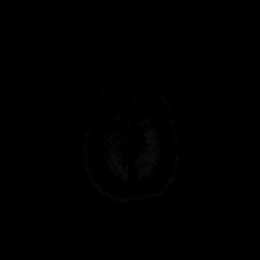

[Series 6: DWI · axial · 3.0mm · 0.88mm/px · z∈[-101,+33]mm · 3 of 46 slices shown (2 of 4)]
[im 1/46]
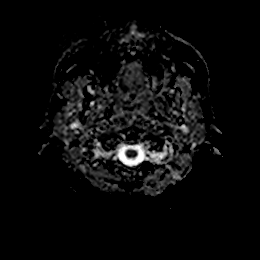
[im 23/46]
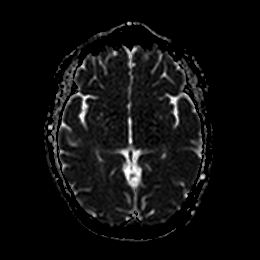
[im 46/46]
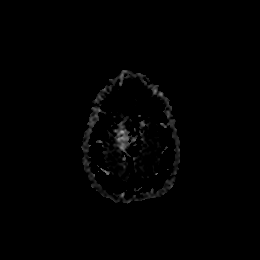

[Series 7: DWI · coronal · 4.0mm · 0.88mm/px · 4 of 64 slices shown (3 of 4)]
[im 1/64]
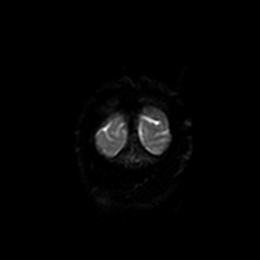
[im 22/64]
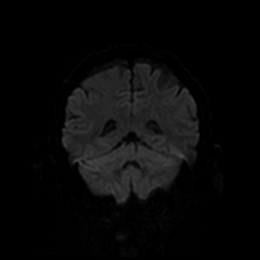
[im 43/64]
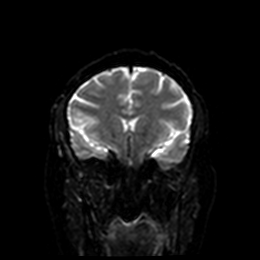
[im 64/64]
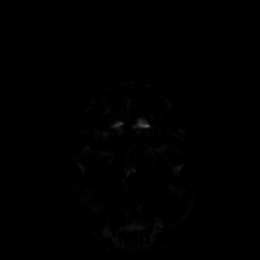

[Series 8: DWI · coronal · 4.0mm · 0.88mm/px · 2 of 32 slices shown (4 of 4)]
[im 1/32]
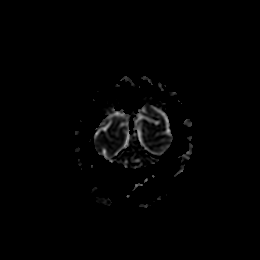
[im 32/32]
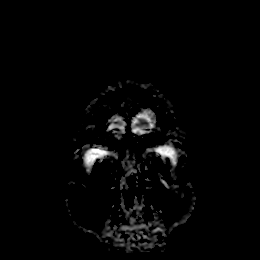

[Series 9: T1 · sagittal · 5.0mm · 0.75mm/px · 1 of 23 slices shown]
[im 1/23]
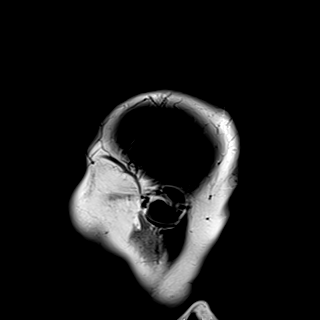

[Series 10: T2 · axial · 5.0mm · 0.72mm/px · z∈[-106,+38]mm · 2 of 25 slices shown]
[im 1/25]
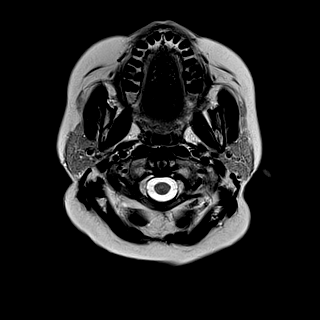
[im 25/25]
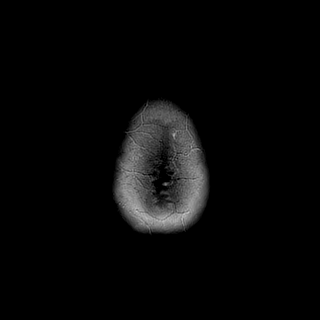

[Series 11: FLAIR · axial · 5.0mm · 0.45mm/px · z∈[-103,+40]mm · 2 of 25 slices shown]
[im 1/25]
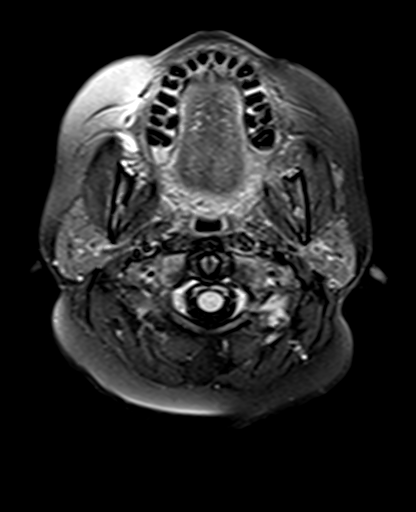
[im 25/25]
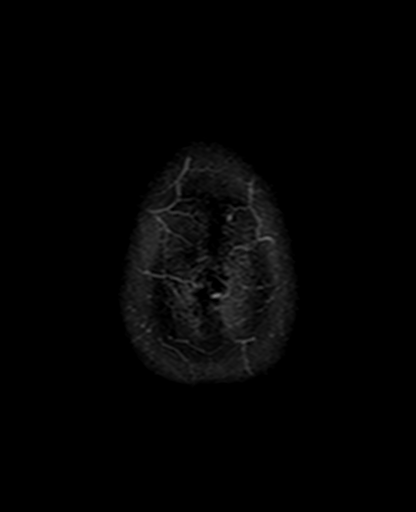

[Series 12: mag_images · axial · 3.0mm · 0.90mm/px · z∈[-115,+61]mm · 4 of 60 slices shown]
[im 1/60]
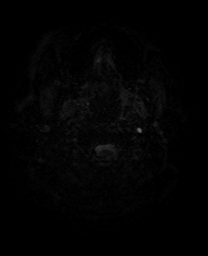
[im 20/60]
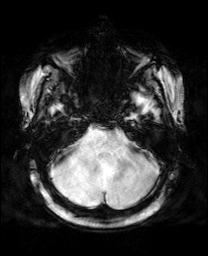
[im 40/60]
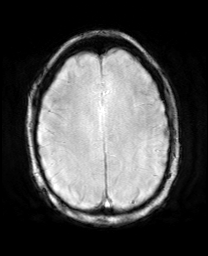
[im 60/60]
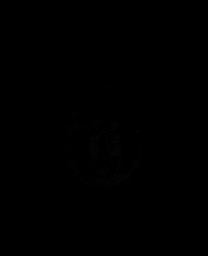

[Series 13: pha_images · axial · 3.0mm · 0.90mm/px · z∈[-115,+58]mm · 4 of 59 slices shown]
[im 1/59]
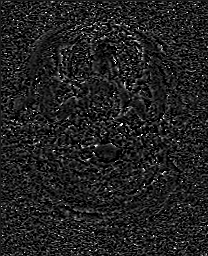
[im 20/59]
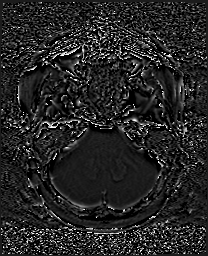
[im 39/59]
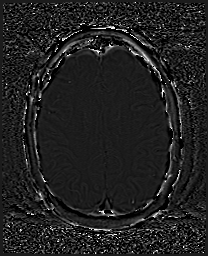
[im 59/59]
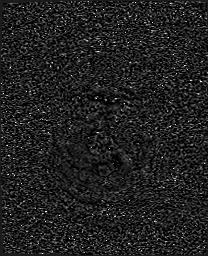

[Series 14: swi_images · axial · 3.0mm · 0.90mm/px · z∈[-115,+61]mm · 4 of 60 slices shown]
[im 1/60]
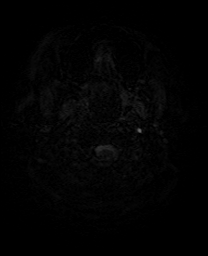
[im 20/60]
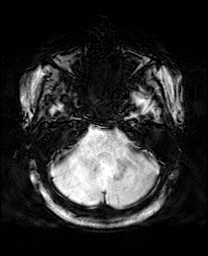
[im 40/60]
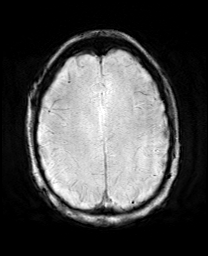
[im 60/60]
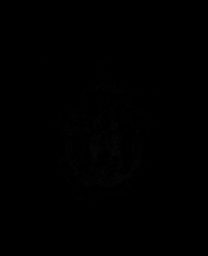

[Series 15: mip_images(sw) · axial · 24.0mm · 0.90mm/px · z∈[-104,+51]mm · 3 of 53 slices shown]
[im 1/53]
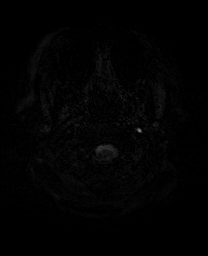
[im 27/53]
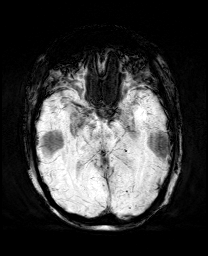
[im 53/53]
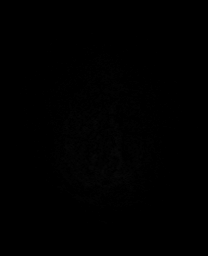

[Series 17: T2 post-contrast · coronal · 5.0mm · 0.72mm/px · 2 of 28 slices shown]
[im 1/28]
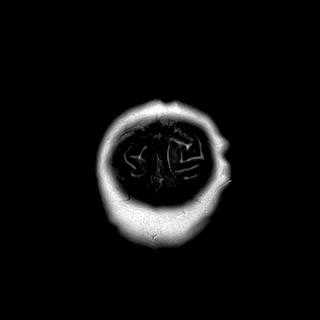
[im 28/28]
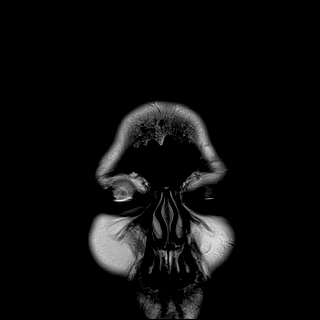

[Series 19: T1 post-contrast · coronal · 5.0mm · 0.34mm/px · 2 of 28 slices shown (1 of 2)]
[im 1/28]
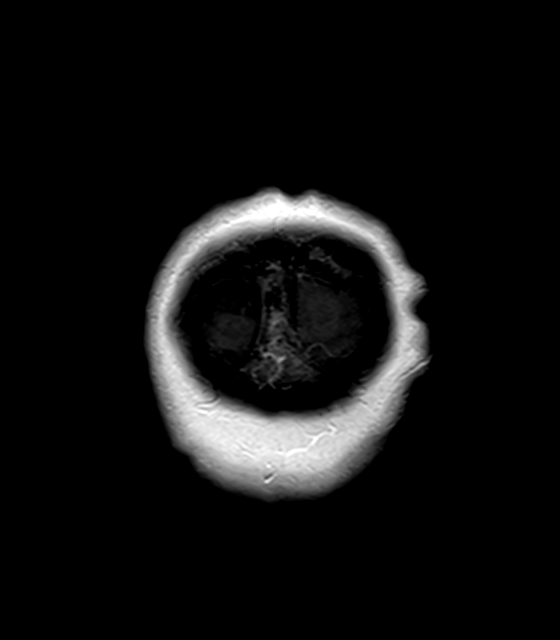
[im 28/28]
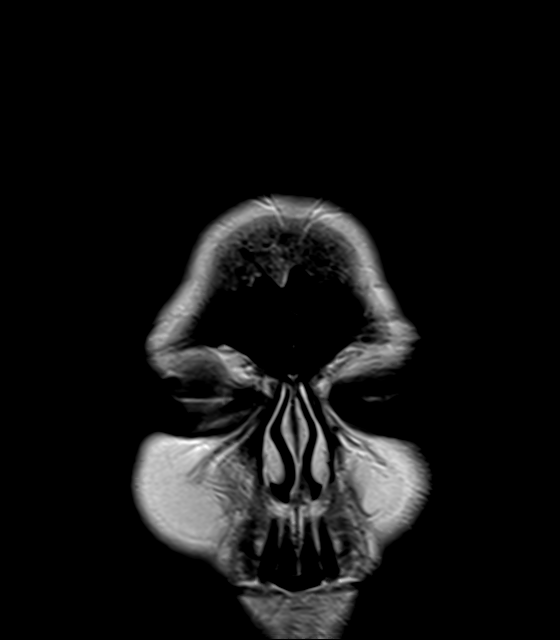

[Series 20: T1 post-contrast · sagittal · 5.0mm · 0.72mm/px · 1 of 23 slices shown (2 of 2)]
[im 1/23]
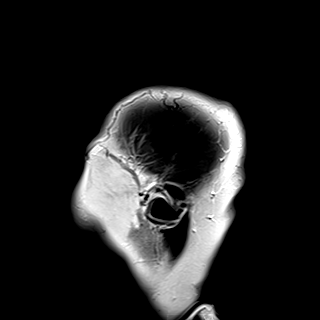

[40 of 48 positions shown; findings below may reference images not displayed]

FINDINGS: Brain: There is a new 5 mm focus of enhancement at the gray-white
junction in the posterior left frontal lobe (series 18, image 45)
without associated edema or restricted diffusion. No abnormal brain
parenchymal or meningeal enhancement suggestive of metastatic
disease is seen elsewhere. A small developmental venous anomaly is
incidentally noted in the left parietal lobe. The brain is otherwise
normal in signal. No acute infarct, intracranial hemorrhage, midline
shift, or extra-axial fluid collection is evident. The ventricles
and sulci are normal.

Vascular: Major intracranial vascular flow voids are preserved.

Skull and upper cervical spine: Abnormal marrow signal in C3
corresponding to a known metastasis. No suspicious skull lesion.

Sinuses/Orbits: Unremarkable orbits. Paranasal sinuses and mastoid
air cells are clear.

Other: None.
IMPRESSION: 1. New 5 mm enhancing lesion in the posterior left frontal lobe most
concerning for a solitary metastasis. No edema.
2. Otherwise unremarkable appearance of the brain.
